# Patient Record
Sex: Male | Born: 1935 | Race: White | Hispanic: No | Smoking: Former smoker
Health system: Southern US, Community
[De-identification: ages and names within clinical notes are randomized; demographics above are authoritative.]

## PROBLEM LIST (undated history)

## (undated) DIAGNOSIS — M1990 Unspecified osteoarthritis, unspecified site: Secondary | ICD-10-CM

## (undated) DIAGNOSIS — K5730 Diverticulosis of large intestine without perforation or abscess without bleeding: Secondary | ICD-10-CM

## (undated) DIAGNOSIS — K219 Gastro-esophageal reflux disease without esophagitis: Secondary | ICD-10-CM

## (undated) DIAGNOSIS — I10 Essential (primary) hypertension: Secondary | ICD-10-CM

## (undated) HISTORY — DX: Unspecified osteoarthritis, unspecified site: M19.90

## (undated) HISTORY — DX: Gastro-esophageal reflux disease without esophagitis: K21.9

## (undated) HISTORY — PX: OTHER SURGICAL HISTORY: SHX169

## (undated) HISTORY — DX: Diverticulosis of large intestine without perforation or abscess without bleeding: K57.30

## (undated) HISTORY — PX: HERNIA REPAIR: SHX51

## (undated) HISTORY — PX: CATARACT EXTRACTION: SUR2

## (undated) HISTORY — DX: Essential (primary) hypertension: I10

---

## 1998-11-07 ENCOUNTER — Ambulatory Visit (HOSPITAL_COMMUNITY): Admission: RE | Admit: 1998-11-07 | Discharge: 1998-11-07 | Payer: Self-pay | Admitting: Gastroenterology

## 1998-11-07 ENCOUNTER — Encounter: Payer: Self-pay | Admitting: Gastroenterology

## 1998-12-03 ENCOUNTER — Ambulatory Visit (HOSPITAL_COMMUNITY): Admission: RE | Admit: 1998-12-03 | Discharge: 1998-12-03 | Payer: Self-pay | Admitting: Gastroenterology

## 1998-12-03 ENCOUNTER — Encounter: Payer: Self-pay | Admitting: Gastroenterology

## 2004-02-08 ENCOUNTER — Emergency Department (HOSPITAL_COMMUNITY): Admission: EM | Admit: 2004-02-08 | Discharge: 2004-02-08 | Payer: Self-pay | Admitting: *Deleted

## 2004-02-18 ENCOUNTER — Ambulatory Visit (HOSPITAL_COMMUNITY): Admission: RE | Admit: 2004-02-18 | Discharge: 2004-02-18 | Payer: Self-pay | Admitting: Gastroenterology

## 2004-03-17 ENCOUNTER — Ambulatory Visit: Payer: Self-pay | Admitting: Family Medicine

## 2004-08-26 ENCOUNTER — Ambulatory Visit: Payer: Self-pay | Admitting: Family Medicine

## 2004-09-23 HISTORY — PX: COLONOSCOPY: SHX174

## 2005-09-01 ENCOUNTER — Ambulatory Visit: Payer: Self-pay | Admitting: Family Medicine

## 2006-09-07 ENCOUNTER — Ambulatory Visit: Payer: Self-pay | Admitting: Family Medicine

## 2006-09-07 LAB — CONVERTED CEMR LAB
ALT: 40 units/L (ref 0–40)
AST: 37 units/L (ref 0–37)
Albumin: 3.9 g/dL (ref 3.5–5.2)
Alkaline Phosphatase: 63 units/L (ref 39–117)
BUN: 16 mg/dL (ref 6–23)
Basophils Absolute: 0 10*3/uL (ref 0.0–0.1)
Basophils Relative: 0.6 % (ref 0.0–1.0)
Bilirubin, Direct: 0.1 mg/dL (ref 0.0–0.3)
CO2: 31 meq/L (ref 19–32)
Calcium: 9.6 mg/dL (ref 8.4–10.5)
Chloride: 108 meq/L (ref 96–112)
Cholesterol: 141 mg/dL (ref 0–200)
Creatinine, Ser: 1 mg/dL (ref 0.4–1.5)
Eosinophils Absolute: 0.3 10*3/uL (ref 0.0–0.6)
Eosinophils Relative: 4.2 % (ref 0.0–5.0)
GFR calc Af Amer: 93 mL/min
GFR calc non Af Amer: 77 mL/min
Glucose, Bld: 101 mg/dL — ABNORMAL HIGH (ref 70–99)
HCT: 45.7 % (ref 39.0–52.0)
HDL: 25.9 mg/dL — ABNORMAL LOW (ref >39.0)
Hemoglobin: 15.8 g/dL (ref 13.0–17.0)
LDL Cholesterol: 80 mg/dL (ref 0–99)
Lymphocytes Relative: 33.4 % (ref 12.0–46.0)
MCHC: 34.6 g/dL (ref 30.0–36.0)
MCV: 92 fL (ref 78.0–100.0)
Monocytes Absolute: 0.8 10*3/uL — ABNORMAL HIGH (ref 0.2–0.7)
Monocytes Relative: 10.3 % (ref 3.0–11.0)
Neutro Abs: 4.4 10*3/uL (ref 1.4–7.7)
Neutrophils Relative %: 51.5 % (ref 43.0–77.0)
PSA: 1.71 ng/mL (ref 0.10–4.00)
Platelets: 270 10*3/uL (ref 150–400)
Potassium: 4.2 meq/L (ref 3.5–5.1)
RBC: 4.96 M/uL (ref 4.22–5.81)
RDW: 12.3 % (ref 11.5–14.6)
Sodium: 143 meq/L (ref 135–145)
TSH: 2.68 microintl units/mL (ref 0.35–5.50)
Total Bilirubin: 1.9 mg/dL — ABNORMAL HIGH (ref 0.3–1.2)
Total CHOL/HDL Ratio: 5.4
Total Protein: 7 g/dL (ref 6.0–8.3)
Triglycerides: 176 mg/dL — ABNORMAL HIGH (ref 0–149)
VLDL: 35 mg/dL (ref 0–40)
WBC: 8.2 10*3/uL (ref 4.5–10.5)

## 2006-09-14 ENCOUNTER — Encounter: Payer: Self-pay | Admitting: Family Medicine

## 2006-09-14 DIAGNOSIS — Z87898 Personal history of other specified conditions: Secondary | ICD-10-CM

## 2006-09-14 DIAGNOSIS — K219 Gastro-esophageal reflux disease without esophagitis: Secondary | ICD-10-CM | POA: Insufficient documentation

## 2006-09-14 DIAGNOSIS — I10 Essential (primary) hypertension: Secondary | ICD-10-CM | POA: Insufficient documentation

## 2007-08-23 ENCOUNTER — Ambulatory Visit: Payer: Self-pay | Admitting: Family Medicine

## 2007-08-23 DIAGNOSIS — R001 Bradycardia, unspecified: Secondary | ICD-10-CM | POA: Insufficient documentation

## 2007-08-23 DIAGNOSIS — K5730 Diverticulosis of large intestine without perforation or abscess without bleeding: Secondary | ICD-10-CM | POA: Insufficient documentation

## 2007-09-06 ENCOUNTER — Ambulatory Visit: Payer: Self-pay | Admitting: Family Medicine

## 2007-09-25 HISTORY — PX: OTHER SURGICAL HISTORY: SHX169

## 2007-10-16 ENCOUNTER — Encounter: Payer: Self-pay | Admitting: Family Medicine

## 2008-03-13 ENCOUNTER — Ambulatory Visit: Payer: Self-pay | Admitting: Family Medicine

## 2008-08-28 ENCOUNTER — Ambulatory Visit: Payer: Self-pay | Admitting: Family Medicine

## 2009-06-11 ENCOUNTER — Ambulatory Visit: Payer: Self-pay | Admitting: Family Medicine

## 2009-06-11 DIAGNOSIS — K645 Perianal venous thrombosis: Secondary | ICD-10-CM | POA: Insufficient documentation

## 2009-06-11 HISTORY — DX: Perianal venous thrombosis: K64.5

## 2009-07-09 ENCOUNTER — Ambulatory Visit: Payer: Self-pay | Admitting: Family Medicine

## 2009-07-09 DIAGNOSIS — J309 Allergic rhinitis, unspecified: Secondary | ICD-10-CM

## 2009-09-03 ENCOUNTER — Ambulatory Visit: Payer: Self-pay | Admitting: Family Medicine

## 2010-05-24 LAB — CONVERTED CEMR LAB
ALT: 35 units/L (ref 0–53)
ALT: 38 units/L (ref 0–53)
ALT: 40 units/L (ref 0–53)
AST: 39 units/L — ABNORMAL HIGH (ref 0–37)
AST: 40 units/L — ABNORMAL HIGH (ref 0–37)
AST: 41 units/L — ABNORMAL HIGH (ref 0–37)
Albumin: 3.5 g/dL (ref 3.5–5.2)
Albumin: 3.7 g/dL (ref 3.5–5.2)
Albumin: 4 g/dL (ref 3.5–5.2)
Alkaline Phosphatase: 56 units/L (ref 39–117)
Alkaline Phosphatase: 69 units/L (ref 39–117)
Alkaline Phosphatase: 74 units/L (ref 39–117)
BUN: 14 mg/dL (ref 6–23)
BUN: 16 mg/dL (ref 6–23)
BUN: 20 mg/dL (ref 6–23)
Basophils Absolute: 0.1 10*3/uL (ref 0.0–0.1)
Basophils Absolute: 0.1 10*3/uL (ref 0.0–0.1)
Basophils Absolute: 0.1 10*3/uL (ref 0.0–0.1)
Basophils Relative: 1 % (ref 0.0–3.0)
Basophils Relative: 1.1 % (ref 0.0–3.0)
Basophils Relative: 1.1 % — ABNORMAL HIGH (ref 0.0–1.0)
Bilirubin, Direct: 0.2 mg/dL (ref 0.0–0.3)
Bilirubin, Direct: 0.2 mg/dL (ref 0.0–0.3)
Bilirubin, Direct: 0.2 mg/dL (ref 0.0–0.3)
CO2: 28 meq/L (ref 19–32)
CO2: 29 meq/L (ref 19–32)
CO2: 32 meq/L (ref 19–32)
Calcium: 9.2 mg/dL (ref 8.4–10.5)
Calcium: 9.2 mg/dL (ref 8.4–10.5)
Calcium: 9.8 mg/dL (ref 8.4–10.5)
Chloride: 102 meq/L (ref 96–112)
Chloride: 102 meq/L (ref 96–112)
Chloride: 108 meq/L (ref 96–112)
Cholesterol: 138 mg/dL (ref 0–200)
Cholesterol: 143 mg/dL (ref 0–200)
Cholesterol: 148 mg/dL (ref 0–200)
Creatinine, Ser: 0.9 mg/dL (ref 0.4–1.5)
Creatinine, Ser: 1 mg/dL (ref 0.4–1.5)
Creatinine, Ser: 1 mg/dL (ref 0.4–1.5)
Eosinophils Absolute: 0.3 10*3/uL (ref 0.0–0.7)
Eosinophils Absolute: 0.3 10*3/uL (ref 0.0–0.7)
Eosinophils Absolute: 0.4 10*3/uL (ref 0.0–0.7)
Eosinophils Relative: 4.1 % (ref 0.0–5.0)
Eosinophils Relative: 4.2 % (ref 0.0–5.0)
Eosinophils Relative: 5.3 % — ABNORMAL HIGH (ref 0.0–5.0)
GFR calc Af Amer: 92 mL/min
GFR calc non Af Amer: 76 mL/min
GFR calc non Af Amer: 76.05 mL/min (ref >60)
GFR calc non Af Amer: 82.49 mL/min (ref >60)
Glucose, Bld: 103 mg/dL — ABNORMAL HIGH (ref 70–99)
Glucose, Bld: 97 mg/dL (ref 70–99)
Glucose, Bld: 97 mg/dL (ref 70–99)
HCT: 46.2 % (ref 39.0–52.0)
HCT: 46.6 % (ref 39.0–52.0)
HCT: 48.5 % (ref 39.0–52.0)
HDL: 20 mg/dL — ABNORMAL LOW (ref >39.0)
HDL: 24.3 mg/dL — ABNORMAL LOW (ref >39.00)
HDL: 31.8 mg/dL — ABNORMAL LOW (ref >39.00)
Hemoglobin: 16.3 g/dL (ref 13.0–17.0)
Hemoglobin: 16.3 g/dL (ref 13.0–17.0)
Hemoglobin: 16.4 g/dL (ref 13.0–17.0)
LDL Cholesterol: 80 mg/dL (ref 0–99)
LDL Cholesterol: 85 mg/dL (ref 0–99)
LDL Cholesterol: 92 mg/dL (ref 0–99)
Lymphocytes Relative: 33.4 % (ref 12.0–46.0)
Lymphocytes Relative: 34.8 % (ref 12.0–46.0)
Lymphocytes Relative: 36.4 % (ref 12.0–46.0)
Lymphs Abs: 2.5 10*3/uL (ref 0.7–4.0)
Lymphs Abs: 2.9 10*3/uL (ref 0.7–4.0)
MCHC: 33.9 g/dL (ref 30.0–36.0)
MCHC: 35 g/dL (ref 30.0–36.0)
MCHC: 35.3 g/dL (ref 30.0–36.0)
MCV: 93.4 fL (ref 78.0–100.0)
MCV: 93.5 fL (ref 78.0–100.0)
MCV: 93.8 fL (ref 78.0–100.0)
Monocytes Absolute: 0.7 10*3/uL (ref 0.1–1.0)
Monocytes Absolute: 0.7 10*3/uL (ref 0.1–1.0)
Monocytes Absolute: 0.9 10*3/uL (ref 0.1–1.0)
Monocytes Relative: 10 % (ref 3.0–12.0)
Monocytes Relative: 10.8 % (ref 3.0–12.0)
Monocytes Relative: 11.1 % (ref 3.0–12.0)
Neutro Abs: 3.4 10*3/uL (ref 1.4–7.7)
Neutro Abs: 3.5 10*3/uL (ref 1.4–7.7)
Neutro Abs: 3.8 10*3/uL (ref 1.4–7.7)
Neutrophils Relative %: 47.3 % (ref 43.0–77.0)
Neutrophils Relative %: 49.4 % (ref 43.0–77.0)
Neutrophils Relative %: 50 % (ref 43.0–77.0)
PSA: 2.08 ng/mL (ref 0.10–4.00)
PSA: 2.08 ng/mL (ref 0.10–4.00)
PSA: 2.31 ng/mL (ref 0.10–4.00)
Platelets: 245 10*3/uL (ref 150.0–400.0)
Platelets: 269 10*3/uL (ref 150–400)
Platelets: 275 10*3/uL (ref 150.0–400.0)
Potassium: 3.7 meq/L (ref 3.5–5.1)
Potassium: 3.7 meq/L (ref 3.5–5.1)
Potassium: 5.2 meq/L — ABNORMAL HIGH (ref 3.5–5.1)
RBC: 4.95 M/uL (ref 4.22–5.81)
RBC: 4.99 M/uL (ref 4.22–5.81)
RBC: 5.17 M/uL (ref 4.22–5.81)
RDW: 12 % (ref 11.5–14.6)
RDW: 12.3 % (ref 11.5–14.6)
RDW: 13.6 % (ref 11.5–14.6)
Sodium: 137 meq/L (ref 135–145)
Sodium: 141 meq/L (ref 135–145)
Sodium: 142 meq/L (ref 135–145)
TSH: 2.09 microintl units/mL (ref 0.35–5.50)
TSH: 2.5 microintl units/mL (ref 0.35–5.50)
TSH: 2.58 microintl units/mL (ref 0.35–5.50)
Total Bilirubin: 2 mg/dL — ABNORMAL HIGH (ref 0.3–1.2)
Total Bilirubin: 2.4 mg/dL — ABNORMAL HIGH (ref 0.3–1.2)
Total Bilirubin: 2.8 mg/dL — ABNORMAL HIGH (ref 0.3–1.2)
Total CHOL/HDL Ratio: 5
Total CHOL/HDL Ratio: 6
Total CHOL/HDL Ratio: 6.9
Total Protein: 6.7 g/dL (ref 6.0–8.3)
Total Protein: 7 g/dL (ref 6.0–8.3)
Total Protein: 7 g/dL (ref 6.0–8.3)
Triglycerides: 129 mg/dL (ref 0–149)
Triglycerides: 156 mg/dL — ABNORMAL HIGH (ref 0.0–149.0)
Triglycerides: 194 mg/dL — ABNORMAL HIGH (ref 0.0–149.0)
VLDL: 26 mg/dL (ref 0–40)
VLDL: 31.2 mg/dL (ref 0.0–40.0)
VLDL: 38.8 mg/dL (ref 0.0–40.0)
WBC: 6.9 10*3/uL (ref 4.5–10.5)
WBC: 7.1 10*3/uL (ref 4.5–10.5)
WBC: 8.1 10*3/uL (ref 4.5–10.5)

## 2010-05-28 NOTE — Assessment & Plan Note (Signed)
Summary: hoarse/stuffy nose/congestion/cjr   Vital Signs:  Patient profile:   77 year old male Weight:      169 pounds BMI:     26.56 Temp:     98.0 degrees F oral Pulse rate:   80 / minute Pulse rhythm:   regular BP sitting:   142 / 58  (left arm) Cuff size:   regular  Vitals Entered By: Raechel Ache, RN (July 09, 2009 10:34 AM) CC: C/o head congestion & hoarseness since Monday.   History of Present Illness: Here for one week of some PND and hoarseness. No HA or sinus pressure or ST or cough or fever. Tried Coricidin.   Allergies (verified): No Known Drug Allergies  Past History:  Past Medical History: Reviewed history from 08/23/2007 and no changes required. Hypertension GERD Diverticulosis, colon  Review of Systems  The patient denies anorexia, fever, weight loss, weight gain, vision loss, decreased hearing, chest pain, syncope, dyspnea on exertion, peripheral edema, prolonged cough, headaches, hemoptysis, abdominal pain, melena, hematochezia, severe indigestion/heartburn, hematuria, incontinence, genital sores, muscle weakness, suspicious skin lesions, transient blindness, difficulty walking, depression, unusual weight change, abnormal bleeding, enlarged lymph nodes, angioedema, breast masses, and testicular masses.    Physical Exam  General:  Well-developed,well-nourished,in no acute distress; alert,appropriate and cooperative throughout examination Head:  Normocephalic and atraumatic without obvious abnormalities. No apparent alopecia or balding. Eyes:  No corneal or conjunctival inflammation noted. EOMI. Perrla. Funduscopic exam benign, without hemorrhages, exudates or papilledema. Vision grossly normal. Ears:  External ear exam shows no significant lesions or deformities.  Otoscopic examination reveals clear canals, tympanic membranes are intact bilaterally without bulging, retraction, inflammation or discharge. Hearing is grossly normal bilaterally. Nose:   External nasal examination shows no deformity or inflammation. Nasal mucosa are pink and moist without lesions or exudates. Mouth:  Oral mucosa and oropharynx without lesions or exudates.  Teeth in good repair. Neck:  No deformities, masses, or tenderness noted.   Impression & Recommendations:  Problem # 1:  ALLERGIC RHINITIS (ICD-477.9)  His updated medication list for this problem includes:    Claritin 10 Mg Tabs (Loratadine) ..... Once daily as needed  Complete Medication List: 1)  Protonix 40 Mg Tbec (Pantoprazole sodium) .... Once daily 2)  Hydrochlorothiazide 25 Mg Tabs (Hydrochlorothiazide) .... Take 1 tab by mouth every morning 3)  Aspirin 81 Mg Tbec (Aspirin) .... One by mouth every day 4)  Multivitamins Tabs (Multiple vitamin) .Marland Kitchen.. 1 by mouth once daily 5)  Mucinex 600 Mg Xr12h-tab (Guaifenesin) .... Two times a day as needed 6)  Claritin 10 Mg Tabs (Loratadine) .... Once daily as needed  Patient Instructions: 1)  Please schedule a follow-up appointment as needed .

## 2010-05-28 NOTE — Assessment & Plan Note (Signed)
Summary: emp-will fast//ccm   Vital Signs:  Patient profile:   77 year old male Height:      67 inches Weight:      164 pounds BMI:     25.78 BP sitting:   120 / 60  (left arm) Cuff size:   regular  Vitals Entered By: Raechel Ache, RN (Sep 03, 2009 9:06 AM) CC: OV, fasting.   History of Present Illness: 77 yr old male for a cpx. He feels great and has no concerns at all.   Allergies (verified): No Known Drug Allergies  Past History:  Past Medical History: Reviewed history from 08/23/2007 and no changes required. Hypertension GERD Diverticulosis, colon  Past Surgical History: Reviewed history from 08/28/2008 and no changes required. Colonoscopy 09-23-04 per Dr. Sherin Quarry, repeat in 10 yrs. Rt inguinal hernia x2 EGD & esophageal dilatation, per Dr. Sherin Quarry 02-18-04 Cataract extraction, right 6-09 per Dr. Dione Booze  Family History: Reviewed history from 08/23/2007 and no changes required. Family History of CAD Male 1st degree relative <60  Social History: Reviewed history from 08/23/2007 and no changes required. Married Never Smoked Alcohol use-no Drug use-no Retired  Review of Systems  The patient denies anorexia, fever, weight loss, weight gain, vision loss, decreased hearing, hoarseness, chest pain, syncope, dyspnea on exertion, peripheral edema, prolonged cough, headaches, hemoptysis, abdominal pain, melena, hematochezia, severe indigestion/heartburn, hematuria, incontinence, genital sores, muscle weakness, suspicious skin lesions, transient blindness, difficulty walking, depression, unusual weight change, abnormal bleeding, enlarged lymph nodes, angioedema, breast masses, and testicular masses.    Physical Exam  General:  Well-developed,well-nourished,in no acute distress; alert,appropriate and cooperative throughout examination Head:  Normocephalic and atraumatic without obvious abnormalities. No apparent alopecia or balding. Eyes:  No corneal or  conjunctival inflammation noted. EOMI. Perrla. Funduscopic exam benign, without hemorrhages, exudates or papilledema. Vision grossly normal. Ears:  External ear exam shows no significant lesions or deformities.  Otoscopic examination reveals clear canals, tympanic membranes are intact bilaterally without bulging, retraction, inflammation or discharge. Hearing is grossly normal bilaterally. Nose:  External nasal examination shows no deformity or inflammation. Nasal mucosa are pink and moist without lesions or exudates. Mouth:  Oral mucosa and oropharynx without lesions or exudates.  Teeth in good repair. Neck:  No deformities, masses, or tenderness noted. Chest Wall:  No deformities, masses, tenderness or gynecomastia noted. Lungs:  Normal respiratory effort, chest expands symmetrically. Lungs are clear to auscultation, no crackles or wheezes. Heart:  Normal rate and regular rhythm. S1 and S2 normal without gallop, murmur, click, rub or other extra sounds. EKG normal Abdomen:  Bowel sounds positive,abdomen soft and non-tender without masses, organomegaly or hernias noted. Rectal:  No external abnormalities noted. Normal sphincter tone. No rectal masses or tenderness. Heme neg. Genitalia:  Testes bilaterally descended without nodularity, tenderness or masses. No scrotal masses or lesions. No penis lesions or urethral discharge. Prostate:  no nodules, no asymmetry, no induration, and 2+ enlarged.   Msk:  No deformity or scoliosis noted of thoracic or lumbar spine.   Pulses:  R and L carotid,radial,femoral,dorsalis pedis and posterior tibial pulses are full and equal bilaterally Extremities:  No clubbing, cyanosis, edema, or deformity noted with normal full range of motion of all joints.   Neurologic:  No cranial nerve deficits noted. Station and gait are normal. Plantar reflexes are down-going bilaterally. DTRs are symmetrical throughout. Sensory, motor and coordinative functions appear intact. Skin:   Intact without suspicious lesions or rashes Cervical Nodes:  No lymphadenopathy noted Axillary Nodes:  No  palpable lymphadenopathy Inguinal Nodes:  No significant adenopathy Psych:  Cognition and judgment appear intact. Alert and cooperative with normal attention span and concentration. No apparent delusions, illusions, hallucinations   Impression & Recommendations:  Problem # 1:  BENIGN PROSTATIC HYPERTROPHY, HX OF (ICD-V13.8)  Orders: TLB-PSA (Prostate Specific Antigen) (84153-PSA)  Problem # 2:  DIVERTICULOSIS, COLON (ICD-562.10)  Orders: Hemoccult Guaiac-1 spec.(in office) (82270)  Problem # 3:  GERD (ICD-530.81)  His updated medication list for this problem includes:    Protonix 40 Mg Tbec (Pantoprazole sodium) ..... Once daily  Problem # 4:  HYPERTENSION (ICD-401.9)  His updated medication list for this problem includes:    Hydrochlorothiazide 25 Mg Tabs (Hydrochlorothiazide) .Marland Kitchen... Take 1 tab by mouth every morning  Orders: Prescription Created Electronically [phone]) UA Dipstick w/o Micro (automated)  (81003) EKG w/ Interpretation (93000) Venipuncture (60454) TLB-Lipid Panel (80061-LIPID) TLB-BMP (Basic Metabolic Panel-BMET) (80048-METABOL) TLB-CBC Platelet - w/Differential (85025-CBCD) TLB-Hepatic/Liver Function Pnl (80076-HEPATIC) TLB-TSH (Thyroid Stimulating Hormone) (84443-TSH)  Complete Medication List: 1)  Protonix 40 Mg Tbec (Pantoprazole sodium) .... Once daily 2)  Hydrochlorothiazide 25 Mg Tabs (Hydrochlorothiazide) .... Take 1 tab by mouth every morning 3)  Aspirin 81 Mg Tbec (Aspirin) .... One by mouth every day 4)  Multivitamins Tabs (Multiple vitamin) .Marland Kitchen.. 1 by mouth once daily  Patient Instructions: 1)  Get labs today Prescriptions: HYDROCHLOROTHIAZIDE 25 MG  TABS (HYDROCHLOROTHIAZIDE) Take 1 tab by mouth every morning  #90 x 3   Entered and Authorized by:   Nelwyn Salisbury MD   Signed by:   Nelwyn Salisbury MD on 09/03/2009   Method used:    Electronically to        [facility]  [address] [phone]* (retail)       [address]       [place], [place]  [id number]       Ph: [phone] or [phone]       Fax: [phone]   RxID:   [id number] PROTONIX 40 MG  TBEC (PANTOPRAZOLE SODIUM) once daily  #90 x 3   Entered and Authorized by:   Nelwyn Salisbury MD   Signed by:   Nelwyn Salisbury MD on 09/03/2009   Method used:   Electronically to        [facility]  [address] [phone]* (retail)       [address]       [place], [place]  [id number]       Ph: [phone] or [phone]       Fax: [phone]   RxID:   [id number]   Appended Document: Orders Update     Clinical Lists Changes  Observations: Added new observation of COMMENTS: Wynona Canes, CMA  Sep 03, 2009 11:13 AM  (09/03/2009 11:12) Added new observation of PH URINE: 7.0  (09/03/2009 11:12) Added new observation of SPEC GR URIN: 1.015  (09/03/2009 11:12) Added new observation of APPEARANCE U: Clear  (09/03/2009 11:12) Added new observation of UA COLOR: yellow  (09/03/2009 11:12) Added new observation of WBC DIPSTK U: negative  (09/03/2009 11:12) Added new observation of NITRITE URN: negative  (09/03/2009 11:12) Added new observation of UROBILINOGEN: 1.0  (09/03/2009 11:12) Added new observation of PROTEIN, URN: 1+  (09/03/2009 11:12) Added new observation of BLOOD UR DIP: negative  (09/03/2009 11:12) Added new observation of KETONES URN: negative  (09/03/2009 11:12) Added new observation of BILIRUBIN UR:  negative  (09/03/2009 11:12) Added new observation of GLUCOSE, URN: negative  (09/03/2009 11:12)      Laboratory Results   Urine Tests  Date/Time Recieved: Sep 03, 2009 11:12 AM  Date/Time Reported: Sep 03, 2009 11:12 AM   Routine Urinalysis   Color: yellow Appearance: Clear Glucose: negative   (Normal Range: Negative) Bilirubin: negative   (Normal Range: Negative) Ketone: negative    (Normal Range: Negative) Spec. Gravity: 1.015   (Normal Range: 1.003-1.035) Blood: negative   (Normal Range: Negative) pH: 7.0   (Normal Range: 5.0-8.0) Protein: 1+   (Normal Range: Negative) Urobilinogen: 1.0   (Normal Range: 0-1) Nitrite: negative   (Normal Range: Negative) Leukocyte Esterace: negative   (Normal Range: Negative)    Comments: Wynona Canes, CMA  Sep 03, 2009 11:13 AM

## 2010-05-28 NOTE — Assessment & Plan Note (Signed)
Summary: PLACE ON BOTTOM//CCM   Vital Signs:  Patient profile:   77 year old male Weight:      166 pounds BMI:     26.09 Temp:     98.1 degrees F oral Pulse rate:   67 / minute BP sitting:   144 / 60  (left arm) Cuff size:   regular  Vitals Entered By: Alfred Levins, CMA (June 11, 2009 8:44 AM) CC: hemorrhoids   History of Present Illness: One week ago he developed a small lump beside the anus which is still present. he has never had this before. There is no swelling or pain or bleeding. His BMs are normal.   Current Medications (verified): 1)  Protonix 40 Mg  Tbec (Pantoprazole Sodium) .... Once Daily 2)  Hydrochlorothiazide 25 Mg  Tabs (Hydrochlorothiazide) .... Take 1 Tab By Mouth Every Morning 3)  Aspirin 81 Mg  Tbec (Aspirin) .... One By Mouth Every Day 4)  Multivitamins   Tabs (Multiple Vitamin) .Marland Kitchen.. 1 By Mouth Once Daily  Allergies (verified): No Known Drug Allergies  Past History:  Past Medical History: Reviewed history from 08/23/2007 and no changes required. Hypertension GERD Diverticulosis, colon  Past Surgical History: Reviewed history from 08/28/2008 and no changes required. Colonoscopy 09-23-04 per Dr. Sherin Quarry, repeat in 10 yrs. Rt inguinal hernia x2 EGD & esophageal dilatation, per Dr. Sherin Quarry 02-18-04 Cataract extraction, right 6-09 per Dr. Dione Booze  Review of Systems  The patient denies anorexia, fever, weight loss, weight gain, vision loss, decreased hearing, hoarseness, chest pain, syncope, dyspnea on exertion, peripheral edema, prolonged cough, headaches, hemoptysis, abdominal pain, melena, hematochezia, severe indigestion/heartburn, hematuria, incontinence, genital sores, muscle weakness, suspicious skin lesions, transient blindness, difficulty walking, depression, unusual weight change, abnormal bleeding, enlarged lymph nodes, angioedema, breast masses, and testicular masses.    Physical Exam  General:  Well-developed,well-nourished,in no acute  distress; alert,appropriate and cooperative throughout examination Rectal:  single small thrombosed nontender hemorrhoid at the anal verge   Impression & Recommendations:  Problem # 1:  EXTERNAL THROMBOSED HEMORRHOIDS (ICD-455.4)  Complete Medication List: 1)  Protonix 40 Mg Tbec (Pantoprazole sodium) .... Once daily 2)  Hydrochlorothiazide 25 Mg Tabs (Hydrochlorothiazide) .... Take 1 tab by mouth every morning 3)  Aspirin 81 Mg Tbec (Aspirin) .... One by mouth every day 4)  Multivitamins Tabs (Multiple vitamin) .Marland Kitchen.. 1 by mouth once daily  Patient Instructions: 1)  At this point since it is not symptomatic, he decided not to have this lanced. he will apply Preparation H as needed . He will return if this becomes uncomfortable.

## 2010-09-08 NOTE — Assessment & Plan Note (Signed)
[facility] OFFICE NOTE   COULSON, WEHNER                      MRN:          [id number]  DATE:09/07/2006                            DOB:          [birth year]    This is a 77 year old gentleman here for a complete physical  examination. He is doing well. He has no complaints at all and feels  great. He continues to exercise by walking  5 or 6 days a week. For  details of his past medical history, family history, social history,  habits, etc, I refer you to our last physical note dated Sep 01, 2005.   ALLERGIES:  None.   CURRENT MEDICATIONS:  1. Aspirin 81 mg per day.  2. Atenolol 50 mg 1 half a tablet per day.  3. Hydrochlorothiazide 50 mg 1 half a tablet per day.  4. Protonix 40 mg per day.  5. Multivitamin daily.   OBJECTIVE:  Height 5 feet 7 inches, weight 170, blood pressure 140/80,  pulse 64 and regular.  IN GENERAL: He appears to be doing well.  SKIN: Clear.  EYES: Clear.  EARS: Clear.  PHARYNX: Clear.  NECK: Supple without lymphadenopathy or masses.  LUNGS: Clear.  CARDIAC: Rate and rhythm regular without gallops, murmurs, or rubs.  Distal pulses are full.   EKG: Within normal limits.  ABDOMEN: Soft, normal bowel sounds, nontender. No masses.  GENITALIA: Normal male. He is circumcised.  RECTAL EXAM: No masses or tenderness. Prostate is mildly enlarged but  smooth. Stool is Hemoccult negative.  EXTREMITIES: No cyanosis, clubbing, or edema.  NEUROLOGIC EXAM: Grossly intact.   ASSESSMENT/PLAN:  1. Complete physical, he is fasting so we will check the usual      laboratories today.  2. Hypertension, stable.  3. Gastroesophageal reflux disease, stable.     Tera Mater. Clent Ridges, MD     SAF/MedQ  DD: 09/07/2006  DT: 09/07/2006  Job #: [id number]

## 2010-09-14 ENCOUNTER — Encounter: Payer: Self-pay | Admitting: Family Medicine

## 2010-09-14 ENCOUNTER — Ambulatory Visit (INDEPENDENT_AMBULATORY_CARE_PROVIDER_SITE_OTHER): Payer: Medicare Other | Admitting: Family Medicine

## 2010-09-14 VITALS — BP 136/72 | HR 68 | Temp 98.1°F | Wt 167.0 lb

## 2010-09-14 DIAGNOSIS — I10 Essential (primary) hypertension: Secondary | ICD-10-CM

## 2010-09-14 DIAGNOSIS — K219 Gastro-esophageal reflux disease without esophagitis: Secondary | ICD-10-CM

## 2010-09-14 DIAGNOSIS — N138 Other obstructive and reflux uropathy: Secondary | ICD-10-CM

## 2010-09-14 DIAGNOSIS — Z0000 Encounter for general adult medical examination without abnormal findings: Secondary | ICD-10-CM

## 2010-09-14 DIAGNOSIS — Z125 Encounter for screening for malignant neoplasm of prostate: Secondary | ICD-10-CM

## 2010-09-14 DIAGNOSIS — E785 Hyperlipidemia, unspecified: Secondary | ICD-10-CM

## 2010-09-14 DIAGNOSIS — N401 Enlarged prostate with lower urinary tract symptoms: Secondary | ICD-10-CM

## 2010-09-14 LAB — BASIC METABOLIC PANEL
Calcium: 9.9 mg/dL (ref 8.4–10.5)
Chloride: 100 mEq/L (ref 96–112)
Creatinine, Ser: 1 mg/dL (ref 0.4–1.5)
GFR: 79.32 mL/min (ref >60.00)

## 2010-09-14 LAB — POCT URINALYSIS DIPSTICK
Glucose, UA: NEGATIVE
Ketones, UA: NEGATIVE
Leukocytes, UA: NEGATIVE
Urobilinogen, UA: 1

## 2010-09-14 LAB — CBC WITH DIFFERENTIAL/PLATELET
Basophils Absolute: 0.1 10*3/uL (ref 0.0–0.1)
Hemoglobin: 16.3 g/dL (ref 13.0–17.0)
Lymphocytes Relative: 24.5 % (ref 12.0–46.0)
Monocytes Relative: 9.9 % (ref 3.0–12.0)
Platelets: 265 10*3/uL (ref 150.0–400.0)
RDW: 13.3 % (ref 11.5–14.6)
WBC: 10.9 10*3/uL — ABNORMAL HIGH (ref 4.5–10.5)

## 2010-09-14 LAB — HEPATIC FUNCTION PANEL
ALT: 40 U/L (ref 0–53)
Total Bilirubin: 1.8 mg/dL — ABNORMAL HIGH (ref 0.3–1.2)

## 2010-09-14 LAB — LIPID PANEL
LDL Cholesterol: 72 mg/dL (ref 0–99)
Total CHOL/HDL Ratio: 5
Triglycerides: 199 mg/dL — ABNORMAL HIGH (ref 0.0–149.0)
VLDL: 39.8 mg/dL (ref 0.0–40.0)

## 2010-09-14 LAB — TSH: TSH: 2.33 u[IU]/mL (ref 0.35–5.50)

## 2010-09-14 MED ORDER — PANTOPRAZOLE SODIUM 40 MG PO TBEC: 40.0000 mg | DELAYED_RELEASE_TABLET | Freq: Every day | ORAL | Status: DC

## 2010-09-14 MED ORDER — HYDROCHLOROTHIAZIDE 25 MG PO TABS: 25.0000 mg | ORAL_TABLET | ORAL | Status: DC

## 2010-09-14 NOTE — Progress Notes (Signed)
  Subjective:    Patient ID: Gabriel [name], male    DOB: [birth year], 77 y.o.   MRN: [id number]  HPI 77 yr old male for a cpx. He feels well and has no concerns. His heartburn is stable, and his BP is stable. No SOB or chest pains. He walks 5 days a week. No bowel or bladder concerns.    Review of Systems  Constitutional: Negative.   HENT: Negative.   Eyes: Negative.   Respiratory: Negative.   Cardiovascular: Negative.   Gastrointestinal: Negative.   Genitourinary: Negative.   Musculoskeletal: Negative.   Skin: Negative.   Neurological: Negative.   Hematological: Negative.   Psychiatric/Behavioral: Negative.        Objective:   Physical Exam  Constitutional: He is oriented to person, place, and time. He appears well-developed and well-nourished. No distress.  HENT:  Head: Normocephalic and atraumatic.  Right Ear: External ear normal.  Left Ear: External ear normal.  Nose: Nose normal.  Mouth/Throat: Oropharynx is clear and moist. No oropharyngeal exudate.  Eyes: Conjunctivae and EOM are normal. Pupils are equal, round, and reactive to light. Right eye exhibits no discharge. Left eye exhibits no discharge. No scleral icterus.  Neck: Neck supple. No JVD present. No tracheal deviation present. No thyromegaly present.  Cardiovascular: Normal rate, regular rhythm, normal heart sounds and intact distal pulses.  Exam reveals no gallop and no friction rub.   No murmur heard.      EKG is normal   Pulmonary/Chest: Effort normal and breath sounds normal. No respiratory distress. He has no wheezes. He has no rales. He exhibits no tenderness.  Abdominal: Soft. Bowel sounds are normal. He exhibits no distension and no mass. There is no tenderness. There is no rebound and no guarding.  Genitourinary: Rectum normal, prostate normal and penis normal. Guaiac negative stool. No penile tenderness.  Musculoskeletal: Normal range of motion. He exhibits no edema and no tenderness.  Lymphadenopathy:     He has no cervical adenopathy.  Neurological: He is alert and oriented to person, place, and time. He has normal reflexes. No cranial nerve deficit. He exhibits normal muscle tone. Coordination normal.  Skin: Skin is warm and dry. No rash noted. He is not diaphoretic. No erythema. No pallor.  Psychiatric: He has a normal mood and affect. His behavior is normal. Judgment and thought content normal.          Assessment & Plan:  We will get fasting labs today. His HTN is stable, as is his GERD. His BPH is controlled. He will continue his current diet and exercise plan.

## 2010-09-15 NOTE — Progress Notes (Signed)
patient  Is aware 

## 2011-02-18 ENCOUNTER — Encounter: Payer: Self-pay | Admitting: Family Medicine

## 2011-02-18 ENCOUNTER — Ambulatory Visit (INDEPENDENT_AMBULATORY_CARE_PROVIDER_SITE_OTHER): Payer: Medicare Other | Admitting: Family Medicine

## 2011-02-18 VITALS — BP 128/70 | HR 75 | Temp 99.2°F | Wt 168.0 lb

## 2011-02-18 DIAGNOSIS — K5732 Diverticulitis of large intestine without perforation or abscess without bleeding: Secondary | ICD-10-CM

## 2011-02-18 MED ORDER — CIPROFLOXACIN HCL 500 MG PO TABS: 500.0000 mg | ORAL_TABLET | Freq: Two times a day (BID) | ORAL | Status: AC

## 2011-02-18 NOTE — Progress Notes (Signed)
  Subjective:    Patient ID: Gabriel [name], male    DOB: 10-17-1924, 77 y.o.   MRN: [id number]  HPI Here for 2 days of LLQ pains which he thinks is due to diverticulitis. He had this once before, and he remembers what this felt like. No nausea or vomiting. No fever. Good appetite. No urinary symptoms.    Review of Systems  Constitutional: Negative.   Respiratory: Negative.   Cardiovascular: Negative.   Gastrointestinal: Positive for abdominal pain. Negative for nausea, vomiting, diarrhea, constipation, blood in stool and abdominal distention.  Genitourinary: Negative.        Objective:   Physical Exam  Constitutional: He appears well-developed and well-nourished.  Abdominal: Soft. Bowel sounds are normal. He exhibits no distension and no mass. There is no rebound and no guarding.       Mildly tender in the LLQ          Assessment & Plan:  Recheck prn

## 2011-03-12 ENCOUNTER — Encounter: Payer: Self-pay | Admitting: Family Medicine

## 2011-03-12 ENCOUNTER — Ambulatory Visit (INDEPENDENT_AMBULATORY_CARE_PROVIDER_SITE_OTHER): Payer: Medicare Other | Admitting: Family Medicine

## 2011-03-12 VITALS — BP 134/64 | HR 76 | Temp 99.0°F | Wt 171.0 lb

## 2011-03-12 DIAGNOSIS — R05 Cough: Secondary | ICD-10-CM

## 2011-03-12 DIAGNOSIS — J3489 Other specified disorders of nose and nasal sinuses: Secondary | ICD-10-CM

## 2011-03-12 DIAGNOSIS — J329 Chronic sinusitis, unspecified: Secondary | ICD-10-CM

## 2011-03-12 MED ORDER — CEPHALEXIN 500 MG PO CAPS: 500.0000 mg | ORAL_CAPSULE | Freq: Three times a day (TID) | ORAL | Status: AC

## 2011-03-12 NOTE — Progress Notes (Signed)
  Subjective:    Patient ID: Gabriel [name], male    DOB: 10-16-1924, 77 y.o.   MRN: [id number]  HPI Here for one week of stuffy head, PND, ST, hoarseness, and a dry cough. No fever.    Review of Systems  Constitutional: Negative.   HENT: Positive for congestion, postnasal drip and sinus pressure.   Eyes: Negative.   Respiratory: Positive for cough.        Objective:   Physical Exam  Constitutional: He appears well-developed and well-nourished.  HENT:  Right Ear: External ear normal.  Left Ear: External ear normal.  Nose: Nose normal.  Mouth/Throat: Oropharynx is clear and moist.  Eyes: Conjunctivae are normal. Pupils are equal, round, and reactive to light.  Neck: No thyromegaly present.  Pulmonary/Chest: Effort normal and breath sounds normal.       Voice is hoarse   Lymphadenopathy:    He has no cervical adenopathy.          Assessment & Plan:  Drink fluids

## 2011-04-12 ENCOUNTER — Telehealth: Payer: Self-pay

## 2011-04-12 NOTE — Telephone Encounter (Signed)
Pt states he usually gets his esophagus stretched and the doctor he usually sees has retired (Dr. Blinda Leatherwood at [facility]). Pt would like a referral to whomever Dr. Clent Ridges suggest.  Pls advise.

## 2011-04-13 NOTE — Telephone Encounter (Signed)
I referred him to Dr. Christella Hartigan. Camelia Eng will call him about the appt

## 2011-04-13 NOTE — Telephone Encounter (Signed)
Spoke with pt

## 2011-04-19 ENCOUNTER — Ambulatory Visit (INDEPENDENT_AMBULATORY_CARE_PROVIDER_SITE_OTHER): Payer: Medicare Other | Admitting: Family Medicine

## 2011-04-19 ENCOUNTER — Encounter: Payer: Self-pay | Admitting: Family Medicine

## 2011-04-19 VITALS — BP 130/70 | HR 93 | Temp 98.7°F | Wt 165.0 lb

## 2011-04-19 DIAGNOSIS — J40 Bronchitis, not specified as acute or chronic: Secondary | ICD-10-CM

## 2011-04-19 MED ORDER — AZITHROMYCIN 250 MG PO TABS: ORAL_TABLET | ORAL | Status: AC

## 2011-04-19 NOTE — Progress Notes (Signed)
  Subjective:    Patient ID: Gabriel [name], male    DOB: 1924-08-02, 77 y.o.   MRN: [id number]  HPI Here for one week of stuffy head, PND, chest congestion, and coughing up yellow sputum. No fever. He was here last month for a sinusitis that responded well to keflex.    Review of Systems  Constitutional: Negative.   HENT: Positive for congestion and postnasal drip.   Eyes: Negative.   Respiratory: Positive for cough.        Objective:   Physical Exam  Constitutional: He appears well-developed and well-nourished.  HENT:  Right Ear: External ear normal.  Left Ear: External ear normal.  Nose: Nose normal.  Mouth/Throat: Oropharynx is clear and moist. No oropharyngeal exudate.  Eyes: Conjunctivae are normal.  Pulmonary/Chest: Effort normal and breath sounds normal. No respiratory distress. He has no wheezes. He has no rales.  Lymphadenopathy:    He has no cervical adenopathy.          Assessment & Plan:  Add Mucinex

## 2011-05-07 ENCOUNTER — Ambulatory Visit (INDEPENDENT_AMBULATORY_CARE_PROVIDER_SITE_OTHER): Payer: Medicare Other | Admitting: Gastroenterology

## 2011-05-07 ENCOUNTER — Encounter: Payer: Self-pay | Admitting: Gastroenterology

## 2011-05-07 DIAGNOSIS — R1310 Dysphagia, unspecified: Secondary | ICD-10-CM

## 2011-05-07 NOTE — Patient Instructions (Signed)
You will be set up for an upper endoscopy at [facility] for dysphagia, likely balloon dilation. A copy of this information will be made available to Dr. Clent Ridges.

## 2011-05-07 NOTE — Progress Notes (Signed)
HPI: This is a   very pleasant 78 year old man whom I am meeting for the first time today.  Had EGD in 2000, 2005 by Dr. Melchor Amour.  Both times dilated.  He has been having the dysphagia trouble again, solid foods, meats, breads.  Chews real well.  Overall stable weight.  No overt GI bleeding.  He used to get pyrosis, but none since starting PPI ,many years ago.    Review of systems: Pertinent positive and negative review of systems were noted in the above HPI section. Complete review of systems was performed and was otherwise normal.    Past Medical History  Diagnosis Date  . Hypertension   . GERD (gastroesophageal reflux disease)   . Diverticulosis of colon     Past Surgical History  Procedure Date  . Colonoscopy 09/23/04    per Dr. Sherin Quarry, repeat in 10 years  . Rt inguinal hernia     x2  . Egd and esophageal diatation, 02-18-04    per Dr. Sherin Quarry  . Cataract extaction, right 6/09    per Dr. Kennedy Bucker    Current Outpatient Prescriptions  Medication Sig Dispense Refill  . aspirin 81 MG tablet Take 81 mg by mouth daily.        . hydrochlorothiazide 25 MG tablet Take 1 tablet (25 mg total) by mouth every morning.  90 tablet  3  . Multiple Vitamin (MULTIVITAMIN PO) Take by mouth daily.        . pantoprazole (PROTONIX) 40 MG tablet Take 1 tablet (40 mg total) by mouth daily.  90 tablet  3  . vitamin C (ASCORBIC ACID) 500 MG tablet Take 500 mg by mouth 2 (two) times daily.          Allergies as of 05/07/2011  . (No Known Allergies)    Family History  Problem Relation Age of Onset  . Coronary artery disease Father     History   Social History  . Marital Status: Married    Spouse Name: N/A    Number of Children: 3  . Years of Education: N/A   Occupational History  . retired    Social History Main Topics  . Smoking status: Former Games developer  . Smokeless tobacco: Never Used  . Alcohol Use: No  . Drug Use: No  . Sexually Active: Not on file   Other Topics Concern  .  Not on file   Social History Narrative  . No narrative on file       Physical Exam: BP 134/78  Pulse 64  Ht 5\' 7"  (1.702 m)  Wt 168 lb 6.4 oz (76.386 kg)  BMI 26.38 kg/m2  SpO2 98% Constitutional: generally well-appearing Psychiatric: alert and oriented x3 Eyes: extraocular movements intact Mouth: oral pharynx moist, no lesions Neck: supple no lymphadenopathy Cardiovascular: heart regular rate and rhythm Lungs: clear to auscultation bilaterally Abdomen: soft, nontender, nondistended, no obvious ascites, no peritoneal signs, normal bowel sounds Extremities: no lower extremity edema bilaterally Skin: no lesions on visible extremities    Assessment and plan: 78 y.o. male with  history of esophageal strictures that have responded to dilation, chronic GERD under good control on proton pump inhibitor, recurrent dysphasia  I suspect he has GERD related dysphasia, likely a peptic stricture. We will proceed with EGD and dilation at his soonest convenience.

## 2011-05-10 ENCOUNTER — Ambulatory Visit (AMBULATORY_SURGERY_CENTER): Payer: Medicare Other | Admitting: Gastroenterology

## 2011-05-10 ENCOUNTER — Encounter: Payer: Self-pay | Admitting: Gastroenterology

## 2011-05-10 VITALS — BP 145/62 | HR 61 | Temp 96.6°F | Resp 12 | Ht 67.0 in | Wt 168.0 lb

## 2011-05-10 DIAGNOSIS — R1310 Dysphagia, unspecified: Secondary | ICD-10-CM | POA: Diagnosis not present

## 2011-05-10 DIAGNOSIS — K449 Diaphragmatic hernia without obstruction or gangrene: Secondary | ICD-10-CM

## 2011-05-10 DIAGNOSIS — K222 Esophageal obstruction: Secondary | ICD-10-CM

## 2011-05-10 HISTORY — PX: OTHER SURGICAL HISTORY: SHX169

## 2011-05-10 MED ORDER — SODIUM CHLORIDE 0.9 % IV SOLN: 500.0000 mL | INTRAVENOUS | Status: DC

## 2011-05-10 NOTE — Op Note (Signed)
[facility] [address] [place], [place]  [address]  ENDOSCOPY PROCEDURE REPORT  PATIENT:  Gabriel [name], Gabriel [name]  MR#:  [id number] BIRTHDATE:  July 20, 1924, 86 yrs. old  GENDER:  male ENDOSCOPIST:  Rachael Fee, MD Referred by:  Tera Mater Clent Ridges, M.D. PROCEDURE DATE:  05/10/2011 PROCEDURE:  EGD with balloon dilatation ASA CLASS:  Class II INDICATIONS:  dysphagia, 2 EGDs with dilation in past 5-8 years MEDICATIONS:   Fentanyl 50 mcg IV, These medications were titrated to patient response per physician's verbal order, Versed 4 mg IV TOPICAL ANESTHETIC:  Cetacaine Spray  DESCRIPTION OF PROCEDURE:   After the risks benefits and alternatives of the procedure were thoroughly explained, informed consent was obtained.  The LB GIF-H180 [id number] endoscope was introduced through the mouth and advanced to the second portion of the duodenum, without limitations.  The instrument was slowly withdrawn as the mucosa was fully examined. <<PROCEDUREIMAGES>> A hiatal hernia was found (see image1 and image4).  There was a benign, focal stricture at GE junction. The lumen was 10-70mm and was dilated up to 18mm with CRE TTS balloon held inflated for 1 minute. There was the usual minor post dilation oozing of blood (see image5, image6, and image7).  Otherwise the examination was normal (see image3).    Retroflexed views revealed no abnormalities.    The scope was then withdrawn from the patient and the procedure completed.  COMPLICATIONS:  None  ENDOSCOPIC IMPRESSION: 1) Hiatal hernia and resultant forshortened, tortuous distal esophagus 2) Benign focal stricture at GE junction, dilated up to 18mm with CRE TTS balloon.  Focal peptic stricture vs. thick mucosal ring. 3) Otherwise normal examination  RECOMMENDATIONS: Can repeat dilation as needed. Continue PPI once daily.  ______________________________ Rachael Fee, MD  n. eSIGNED:   Rachael Fee at 05/10/2011 09:17  AM  Orman, Ivor Messier, [phone]

## 2011-05-10 NOTE — Progress Notes (Signed)
Patient did not experience any of the following events: a burn prior to discharge; a fall within the facility; wrong site/side/patient/procedure/implant event; or a hospital transfer or hospital admission upon discharge from the facility. (G8907) Patient did not have preoperative order for IV antibiotic SSI prophylaxis. (G8918)  

## 2011-05-10 NOTE — Patient Instructions (Signed)
Patient did not experience any of the following events: a burn prior to discharge; a fall within the facility; wrong site/side/patient/procedure/implant event; or a hospital transfer or hospital admission upon discharge from the facility. (G8907)Patient did not experience any of the following events: a burn prior to discharge; a fall within the facility; wrong site/side/patient/procedure/implant event; or a hospital transfer or hospital admission upon discharge from the facility. (G8907) 

## 2011-05-11 ENCOUNTER — Telehealth: Payer: Self-pay | Admitting: *Deleted

## 2011-05-11 NOTE — Telephone Encounter (Signed)

## 2011-06-30 ENCOUNTER — Telehealth: Payer: Self-pay | Admitting: *Deleted

## 2011-06-30 NOTE — Telephone Encounter (Signed)
Discussed with pt

## 2011-06-30 NOTE — Telephone Encounter (Addendum)
Pt needs a referral to Dr. Oretha Milch for hearing loss.  Needs a statement written by Dr. Clent Ridges and faxed to [facility].  [phone]

## 2011-07-01 NOTE — Telephone Encounter (Signed)
Pt needs this referral note sent to [facility] for hearing loss.  [phone]

## 2011-07-01 NOTE — Telephone Encounter (Signed)
Please get more info.

## 2011-07-02 NOTE — Telephone Encounter (Signed)
Addended by: Gershon Crane A on: 07/02/2011 02:35 PM   Modules accepted: Orders

## 2011-07-02 NOTE — Telephone Encounter (Signed)
Left message for pt, Dr. Clent Ridges will send in the referral.

## 2011-07-02 NOTE — Telephone Encounter (Signed)
done

## 2011-07-06 DIAGNOSIS — H908 Mixed conductive and sensorineural hearing loss, unspecified: Secondary | ICD-10-CM | POA: Diagnosis not present

## 2011-07-07 DIAGNOSIS — J0100 Acute maxillary sinusitis, unspecified: Secondary | ICD-10-CM | POA: Diagnosis not present

## 2011-07-19 DIAGNOSIS — J0100 Acute maxillary sinusitis, unspecified: Secondary | ICD-10-CM | POA: Diagnosis not present

## 2011-09-15 ENCOUNTER — Ambulatory Visit (INDEPENDENT_AMBULATORY_CARE_PROVIDER_SITE_OTHER): Payer: Medicare Other | Admitting: Family Medicine

## 2011-09-15 ENCOUNTER — Encounter: Payer: Self-pay | Admitting: Family Medicine

## 2011-09-15 VITALS — BP 130/64 | HR 62 | Temp 99.0°F | Ht 66.5 in | Wt 166.0 lb

## 2011-09-15 DIAGNOSIS — I10 Essential (primary) hypertension: Secondary | ICD-10-CM

## 2011-09-15 DIAGNOSIS — N139 Obstructive and reflux uropathy, unspecified: Secondary | ICD-10-CM | POA: Diagnosis not present

## 2011-09-15 DIAGNOSIS — N401 Enlarged prostate with lower urinary tract symptoms: Secondary | ICD-10-CM | POA: Diagnosis not present

## 2011-09-15 DIAGNOSIS — K219 Gastro-esophageal reflux disease without esophagitis: Secondary | ICD-10-CM | POA: Diagnosis not present

## 2011-09-15 DIAGNOSIS — N138 Other obstructive and reflux uropathy: Secondary | ICD-10-CM

## 2011-09-15 LAB — BASIC METABOLIC PANEL
Calcium: 9.8 mg/dL (ref 8.4–10.5)
GFR: 86.36 mL/min (ref >60.00)
Glucose, Bld: 92 mg/dL (ref 70–99)
Potassium: 5.1 mEq/L (ref 3.5–5.1)
Sodium: 142 mEq/L (ref 135–145)

## 2011-09-15 LAB — CBC WITH DIFFERENTIAL/PLATELET
Basophils Relative: 1.3 % (ref 0.0–3.0)
Eosinophils Relative: 4.3 % (ref 0.0–5.0)
Monocytes Relative: 10.7 % (ref 3.0–12.0)
Neutrophils Relative %: 49.2 % (ref 43.0–77.0)
Platelets: 306 10*3/uL (ref 150.0–400.0)
RBC: 4.95 Mil/uL (ref 4.22–5.81)
WBC: 8.4 10*3/uL (ref 4.5–10.5)

## 2011-09-15 LAB — HEPATIC FUNCTION PANEL
Albumin: 3.9 g/dL (ref 3.5–5.2)
Alkaline Phosphatase: 80 U/L (ref 39–117)
Bilirubin, Direct: 0.2 mg/dL (ref 0.0–0.3)
Total Protein: 7 g/dL (ref 6.0–8.3)

## 2011-09-15 LAB — LIPID PANEL
HDL: 26.7 mg/dL — ABNORMAL LOW (ref >39.00)
Total CHOL/HDL Ratio: 6
Triglycerides: 235 mg/dL — ABNORMAL HIGH (ref 0.0–149.0)
VLDL: 47 mg/dL — ABNORMAL HIGH (ref 0.0–40.0)

## 2011-09-15 LAB — POCT URINALYSIS DIPSTICK
Bilirubin, UA: NEGATIVE
Glucose, UA: NEGATIVE
Nitrite, UA: NEGATIVE
Spec Grav, UA: 1.015
Urobilinogen, UA: 0.2

## 2011-09-15 LAB — PSA: PSA: 2.32 ng/mL (ref 0.10–4.00)

## 2011-09-15 LAB — TSH: TSH: 3.31 u[IU]/mL (ref 0.35–5.50)

## 2011-09-15 LAB — LDL CHOLESTEROL, DIRECT: Direct LDL: 78.4 mg/dL

## 2011-09-15 MED ORDER — HYDROCHLOROTHIAZIDE 25 MG PO TABS: 25.0000 mg | ORAL_TABLET | ORAL | Status: DC

## 2011-09-15 MED ORDER — PANTOPRAZOLE SODIUM 40 MG PO TBEC: 40.0000 mg | DELAYED_RELEASE_TABLET | Freq: Every day | ORAL | Status: DC

## 2011-09-15 NOTE — Progress Notes (Signed)
  Subjective:    Patient ID: Gabriel [name], male    DOB: [birth year], 78 y.o.   MRN: [id number]  HPI 78 yr old male for a cpx. He feels fine and has no concerns. He recently purchased a pair of hearing aids, and he is very pleased with the results.    Review of Systems  Constitutional: Negative.   HENT: Negative.   Eyes: Negative.   Respiratory: Negative.   Cardiovascular: Negative.   Gastrointestinal: Negative.   Genitourinary: Negative.   Musculoskeletal: Negative.   Skin: Negative.   Neurological: Negative.   Hematological: Negative.   Psychiatric/Behavioral: Negative.        Objective:   Physical Exam  Constitutional: He is oriented to person, place, and time. He appears well-developed and well-nourished. No distress.  HENT:  Head: Normocephalic and atraumatic.  Right Ear: External ear normal.  Left Ear: External ear normal.  Nose: Nose normal.  Mouth/Throat: Oropharynx is clear and moist. No oropharyngeal exudate.  Eyes: Conjunctivae and EOM are normal. Pupils are equal, round, and reactive to light. Right eye exhibits no discharge. Left eye exhibits no discharge. No scleral icterus.  Neck: Neck supple. No JVD present. No tracheal deviation present. No thyromegaly present.  Cardiovascular: Normal rate, regular rhythm, normal heart sounds and intact distal pulses.  Exam reveals no gallop and no friction rub.   No murmur heard.      EKG normal   Pulmonary/Chest: Effort normal and breath sounds normal. No respiratory distress. He has no wheezes. He has no rales. He exhibits no tenderness.  Abdominal: Soft. Bowel sounds are normal. He exhibits no distension and no mass. There is no tenderness. There is no rebound and no guarding.  Genitourinary: Rectum normal, prostate normal and penis normal. Guaiac negative stool. No penile tenderness.  Musculoskeletal: Normal range of motion. He exhibits no edema and no tenderness.  Lymphadenopathy:    He has no cervical adenopathy.    Neurological: He is alert and oriented to person, place, and time. He has normal reflexes. No cranial nerve deficit. He exhibits normal muscle tone. Coordination normal.  Skin: Skin is warm and dry. No rash noted. He is not diaphoretic. No erythema. No pallor.  Psychiatric: He has a normal mood and affect. His behavior is normal. Judgment and thought content normal.          Assessment & Plan:  Well exam. Get fasting labs.

## 2011-09-28 DIAGNOSIS — Z85828 Personal history of other malignant neoplasm of skin: Secondary | ICD-10-CM | POA: Diagnosis not present

## 2011-09-28 DIAGNOSIS — L570 Actinic keratosis: Secondary | ICD-10-CM | POA: Diagnosis not present

## 2011-09-28 DIAGNOSIS — D235 Other benign neoplasm of skin of trunk: Secondary | ICD-10-CM | POA: Diagnosis not present

## 2011-09-28 DIAGNOSIS — C44319 Basal cell carcinoma of skin of other parts of face: Secondary | ICD-10-CM | POA: Diagnosis not present

## 2011-11-02 DIAGNOSIS — Z85828 Personal history of other malignant neoplasm of skin: Secondary | ICD-10-CM | POA: Diagnosis not present

## 2011-11-02 DIAGNOSIS — L570 Actinic keratosis: Secondary | ICD-10-CM | POA: Diagnosis not present

## 2011-12-16 DIAGNOSIS — H3530 Unspecified macular degeneration: Secondary | ICD-10-CM | POA: Diagnosis not present

## 2011-12-16 DIAGNOSIS — D3130 Benign neoplasm of unspecified choroid: Secondary | ICD-10-CM | POA: Diagnosis not present

## 2012-01-20 DIAGNOSIS — Z23 Encounter for immunization: Secondary | ICD-10-CM | POA: Diagnosis not present

## 2012-02-21 ENCOUNTER — Ambulatory Visit (INDEPENDENT_AMBULATORY_CARE_PROVIDER_SITE_OTHER): Payer: Medicare Other | Admitting: Family Medicine

## 2012-02-21 ENCOUNTER — Encounter: Payer: Self-pay | Admitting: Family Medicine

## 2012-02-21 VITALS — BP 130/70 | HR 63 | Temp 98.6°F | Wt 165.0 lb

## 2012-02-21 DIAGNOSIS — R112 Nausea with vomiting, unspecified: Secondary | ICD-10-CM

## 2012-02-21 DIAGNOSIS — K8020 Calculus of gallbladder without cholecystitis without obstruction: Secondary | ICD-10-CM

## 2012-02-21 DIAGNOSIS — I10 Essential (primary) hypertension: Secondary | ICD-10-CM | POA: Diagnosis not present

## 2012-02-21 NOTE — Progress Notes (Signed)
  Subjective:    Patient ID: Gabriel [name], male    DOB: [birth year], 78 y.o.   MRN: [id number]  HPI Here for 3 episodes of vomiting in the past 3 weeks. This is usually shortly after a meal, and he remembers one such meal was fried seafood. He has a foul taste in the mouth and he has been doing a lot of belching. No abdominal pains or fever. BMs are regular. He still takes a Protonix daily.    Review of Systems  Respiratory: Negative.   Cardiovascular: Negative.   Gastrointestinal: Positive for nausea and vomiting. Negative for diarrhea, constipation, blood in stool, abdominal distention and anal bleeding.       Objective:   Physical Exam  Constitutional: He appears well-developed and well-nourished.  Cardiovascular: Normal rate, regular rhythm, normal heart sounds and intact distal pulses.   Pulmonary/Chest: Effort normal and breath sounds normal.  Abdominal: Soft. Bowel sounds are normal. He exhibits no distension and no mass. There is no tenderness. There is no rebound and no guarding.          Assessment & Plan:  Post-prandial vomiting, possible gall bladder issues. Advised him to avoid fatty or fried foods. We will set up an [place] soon.

## 2012-02-25 ENCOUNTER — Ambulatory Visit
Admission: RE | Admit: 2012-02-25 | Discharge: 2012-02-25 | Disposition: A | Discharge status: Home / Self Care | Payer: Medicare Other | Source: Ambulatory Visit | Attending: Family Medicine | Admitting: Family Medicine

## 2012-02-25 DIAGNOSIS — R112 Nausea with vomiting, unspecified: Secondary | ICD-10-CM

## 2012-02-25 DIAGNOSIS — K8020 Calculus of gallbladder without cholecystitis without obstruction: Secondary | ICD-10-CM | POA: Diagnosis not present

## 2012-02-25 NOTE — Addendum Note (Signed)
Addended by: Gershon Crane A on: 02/25/2012 12:30 PM   Modules accepted: Orders

## 2012-02-25 NOTE — Progress Notes (Signed)
Quick Note:  I left voice message with results. ______ 

## 2012-02-28 ENCOUNTER — Ambulatory Visit: Payer: Medicare Other

## 2012-02-28 ENCOUNTER — Ambulatory Visit (INDEPENDENT_AMBULATORY_CARE_PROVIDER_SITE_OTHER): Payer: Medicare Other | Admitting: Family Medicine

## 2012-02-28 VITALS — BP 128/82 | HR 81 | Temp 98.2°F | Resp 17 | Ht 65.0 in | Wt 164.0 lb

## 2012-02-28 DIAGNOSIS — M25572 Pain in left ankle and joints of left foot: Secondary | ICD-10-CM

## 2012-02-28 DIAGNOSIS — M79605 Pain in left leg: Secondary | ICD-10-CM

## 2012-02-28 DIAGNOSIS — M79609 Pain in unspecified limb: Secondary | ICD-10-CM

## 2012-02-28 DIAGNOSIS — M25579 Pain in unspecified ankle and joints of unspecified foot: Secondary | ICD-10-CM | POA: Diagnosis not present

## 2012-02-28 DIAGNOSIS — S82839A Other fracture of upper and lower end of unspecified fibula, initial encounter for closed fracture: Secondary | ICD-10-CM

## 2012-02-28 DIAGNOSIS — S82409A Unspecified fracture of shaft of unspecified fibula, initial encounter for closed fracture: Secondary | ICD-10-CM | POA: Diagnosis not present

## 2012-02-28 NOTE — Progress Notes (Signed)
[facility]:  Office Visit  Chief Complaint:  Chief Complaint  Patient presents with  . Leg Pain    left leg     HPI: Gabriel [name] is a 78 y.o. male who complains of  Left ankle and leg pain s/p fall on Saturday 3 days ago after he tried to kick a ball that was in his yard. He did fall on his left side and landed on gravel. Did not noticed swelling but has swelling currently, he has no pain at rest but has pain that is localized to anterior ankle 8/10 that is described as Sharp pain with weight bearing. He is tender at upper left leg near knee on palpation only, does not have pain in his upper leg with weightbearing. He denies any hip pain. Denies numbness, tingling, No prior injuries or surgeries to leg or ankles. He has not tried anything for it. Denies any h/o osteoporosis or osteopenia. Tried OTC NSAIDs with minimal relief.  Past Medical History  Diagnosis Date  . Hypertension   . GERD (gastroesophageal reflux disease)   . Diverticulosis of colon    Past Surgical History  Procedure Date  . Colonoscopy 09/23/04    per Dr. Sherin Quarry, repeat in 10 years  . Rt inguinal hernia     x2  . Egd and esophageal diatation, 05-10-11    per Dr. Christella Hartigan    . Cataract extaction, right 6/09    per Dr. Kennedy Bucker  . Hernia repair    History   Social History  . Marital Status: Married    Spouse Name: N/A    Number of Children: 3  . Years of Education: N/A   Occupational History  . retired    Social History Main Topics  . Smoking status: Former Games developer  . Smokeless tobacco: Never Used  . Alcohol Use: No  . Drug Use: No  . Sexually Active: No   Other Topics Concern  . None   Social History Narrative  . None   Family History  Problem Relation Age of Onset  . Coronary artery disease Father   . Colon cancer Neg Hx   . Stomach cancer Neg Hx   . Esophageal cancer Neg Hx    No Known Allergies Prior to Admission medications   Medication Sig Start Date End Date  Taking? Authorizing Provider  aspirin 81 MG tablet Take 81 mg by mouth daily.     Yes Historical Provider, MD  hydrochlorothiazide (HYDRODIURIL) 25 MG tablet Take 1 tablet (25 mg total) by mouth every morning. 09/15/11  Yes Nelwyn Salisbury, MD  Multiple Vitamin (MULTIVITAMIN PO) Take by mouth daily.     Yes Historical Provider, MD  pantoprazole (PROTONIX) 40 MG tablet Take 1 tablet (40 mg total) by mouth daily. 09/15/11  Yes Nelwyn Salisbury, MD  vitamin C (ASCORBIC ACID) 500 MG tablet Take 500 mg by mouth daily.    Yes Historical Provider, MD     ROS: The patient denies fevers, chills, night sweats, unintentional weight loss, chest pain, palpitations, wheezing, dyspnea on exertion, nausea, vomiting, abdominal pain, dysuria, hematuria, melena, numbness, weakness, or tingling.   All other systems have been reviewed and were otherwise negative with the exception of those mentioned in the HPI and as above.    PHYSICAL EXAM: Filed Vitals:   02/28/12 1001  BP: 128/82  Pulse: 81  Temp: 98.2 F (36.8 C)  Resp: 17   Filed Vitals:   02/28/12 1001  Height:  5\' 5"  (1.651 m)  Weight: 164 lb (74.39 kg)   Body mass index is 27.29 kg/(m^2).  General: Alert, no acute distress HEENT:  Normocephalic, atraumatic, oropharynx patent.  Cardiovascular:  Regular rate and rhythm, no rubs murmurs or gallops.  No Carotid bruits, radial pulse intact. No pedal edema.  Respiratory: Clear to auscultation bilaterally.  No wheezes, rales, or rhonchi.  No cyanosis, no use of accessory musculature GI: No organomegaly, abdomen is soft and non-tender, positive bowel sounds.  No masses. Skin: No rashes. Neurologic: Facial musculature symmetric. Psychiatric: Patient is appropriate throughout our interaction. Lymphatic: No cervical lymphadenopathy Musculoskeletal: Gait intact. Left hip-normal ROM Left leg-tender at proximal fib Left ankle-no deformities, skin intact, + tenderness on palpation, mild swelling, + DP, + post  tib artery pulse 5/5 strength, PROM, AROM intact. Neg anterior drawer.     LABS: Results for orders placed in visit on 09/15/11  LIPID PANEL      Component Value Range   Cholesterol 150  0 - 200 mg/dL   Triglycerides 782.9 (*) 0.0 - 149.0 mg/dL   HDL 56.21 (*) >30.86 mg/dL   VLDL 57.8 (*) 0.0 - 46.9 mg/dL   Total CHOL/HDL Ratio 6    BASIC METABOLIC PANEL      Component Value Range   Sodium 142  135 - 145 mEq/L   Potassium 5.1  3.5 - 5.1 mEq/L   Chloride 105  96 - 112 mEq/L   CO2 29  19 - 32 mEq/L   Glucose, Bld 92  70 - 99 mg/dL   BUN 17  6 - 23 mg/dL   Creatinine, Ser 0.9  0.4 - 1.5 mg/dL   Calcium 9.8  8.4 - 62.9 mg/dL   GFR 52.84  >13.24 mL/min  PSA      Component Value Range   PSA 2.32  0.10 - 4.00 ng/mL  HEPATIC FUNCTION PANEL      Component Value Range   Total Bilirubin 1.6 (*) 0.3 - 1.2 mg/dL   Bilirubin, Direct 0.2  0.0 - 0.3 mg/dL   Alkaline Phosphatase 80  39 - 117 U/L   AST 44 (*) 0 - 37 U/L   ALT 44  0 - 53 U/L   Total Protein 7.0  6.0 - 8.3 g/dL   Albumin 3.9  3.5 - 5.2 g/dL  POCT URINALYSIS DIPSTICK      Component Value Range   Color, UA yellow     Clarity, UA clear     Glucose, UA n     Bilirubin, UA n     Ketones, UA n     Spec Grav, UA 1.015     Blood, UA n     pH, UA 7.0     Protein, UA n     Urobilinogen, UA 0.2     Nitrite, UA n     Leukocytes, UA Negative    TSH      Component Value Range   TSH 3.31  0.35 - 5.50 uIU/mL  CBC WITH DIFFERENTIAL      Component Value Range   WBC 8.4  4.5 - 10.5 K/uL   RBC 4.95  4.22 - 5.81 Mil/uL   Hemoglobin 15.6  13.0 - 17.0 g/dL   HCT 40.1  02.7 - 25.3 %   MCV 93.9  78.0 - 100.0 fl   MCHC 33.5  30.0 - 36.0 g/dL   RDW 66.4  40.3 - 47.4 %   Platelets 306.0  150.0 - 400.0 K/uL  Neutrophils Relative 49.2  43.0 - 77.0 %   Lymphocytes Relative 34.5  12.0 - 46.0 %   Monocytes Relative 10.7  3.0 - 12.0 %   Eosinophils Relative 4.3  0.0 - 5.0 %   Basophils Relative 1.3  0.0 - 3.0 %   Neutro Abs 4.1  1.4  - 7.7 K/uL   Lymphs Abs 2.9  0.7 - 4.0 K/uL   Monocytes Absolute 0.9  0.1 - 1.0 K/uL   Eosinophils Absolute 0.4  0.0 - 0.7 K/uL   Basophils Absolute 0.1  0.0 - 0.1 K/uL  LDL CHOLESTEROL, DIRECT      Component Value Range   Direct LDL 78.4       EKG/XRAY:   Primary read interpreted by Dr. Conley Rolls at [facility]. Left ankle no fractures/subluxation Left tib/fib + proximal fibula fx, minimally to non displaced   ASSESSMENT/PLAN: Encounter Diagnoses  Name Primary?  Marland Kitchen Ankle pain, left Yes  . Left leg pain   . Fracture, fibula, proximal    He has ankle pain with an isolated proximal fibula fracture and a negative ankle xray, worrisome for syndesmotic injury with this picture. Will send to ortho.  Refer to  Dr. Thurston Hole , appt made and is at 3 pm.  Rx Tramadol for pain since does not want narcotics Posterior splint and crutches given     Denilson Salminen PHUONG, DO 02/29/2012 10:24 AM

## 2012-03-01 ENCOUNTER — Ambulatory Visit (INDEPENDENT_AMBULATORY_CARE_PROVIDER_SITE_OTHER): Payer: Medicare Other | Admitting: General Surgery

## 2012-03-10 ENCOUNTER — Ambulatory Visit (INDEPENDENT_AMBULATORY_CARE_PROVIDER_SITE_OTHER): Payer: Medicare Other | Admitting: General Surgery

## 2012-03-10 ENCOUNTER — Encounter (INDEPENDENT_AMBULATORY_CARE_PROVIDER_SITE_OTHER): Payer: Self-pay | Admitting: General Surgery

## 2012-03-10 VITALS — BP 132/74 | HR 56 | Temp 97.7°F | Resp 18 | Ht 66.5 in | Wt 168.0 lb

## 2012-03-10 DIAGNOSIS — K8020 Calculus of gallbladder without cholecystitis without obstruction: Secondary | ICD-10-CM | POA: Diagnosis not present

## 2012-03-10 NOTE — Progress Notes (Signed)
Subjective:     Patient ID: Gabriel [name], male   DOB: 1924-12-01, 78 y.o.   MRN: [id number]  HPI The patient is an 78 year old male who is referred by Dr. Clent Ridges for nausea vomiting and ultrasound with gallstones.  The patient states that approximately 2 months ago patient had some nausea and vomiting after eating some fried foods, but had no abdominal pain. This prompted ultrasound which revealed gallstones within the gallbladder. Patient says since that time had no nausea vomiting no abdominal pain, and has continued on the same diet.  Ultrasound reveals multiple gallstones however no signs or symptoms of inflammation.  Review of Systems  Constitutional: Negative.   HENT: Negative.   Eyes: Negative.   Respiratory: Negative.   Cardiovascular: Negative.   Gastrointestinal: Negative.   Musculoskeletal: Negative.   Neurological: Negative.        Objective:   Physical Exam  Constitutional: He is oriented to person, place, and time. He appears well-developed and well-nourished.  HENT:  Head: Normocephalic and atraumatic.  Eyes: Conjunctivae normal and EOM are normal. Pupils are equal, round, and reactive to light.  Neck: Normal range of motion. Neck supple.  Cardiovascular: Normal rate, regular rhythm and normal heart sounds.   Pulmonary/Chest: Effort normal and breath sounds normal.  Abdominal: Soft. There is no tenderness. There is no rebound and no guarding.  Musculoskeletal: Normal range of motion.  Neurological: He is alert and oriented to person, place, and time.       Assessment:     Patient is an 78 year old male with gallstones.    Plan:     1. Enough of the patient's symptoms at this time related to his gallstones. Patient had an unchanged diet since his initial nausea vomiting which is not returned. He has had no pain in the right upper quadrant. 2. I discussed with the patient and his daughter that should his right upper quadrant pain and nausea return will revisit  the possibility of removing his gallbladder. At this time recommend stain on a low fat diet away from spicy foods.

## 2012-03-14 DIAGNOSIS — S8290XD Unspecified fracture of unspecified lower leg, subsequent encounter for closed fracture with routine healing: Secondary | ICD-10-CM | POA: Diagnosis not present

## 2012-03-21 DIAGNOSIS — C44319 Basal cell carcinoma of skin of other parts of face: Secondary | ICD-10-CM | POA: Diagnosis not present

## 2012-03-21 DIAGNOSIS — L570 Actinic keratosis: Secondary | ICD-10-CM | POA: Diagnosis not present

## 2012-03-21 DIAGNOSIS — D235 Other benign neoplasm of skin of trunk: Secondary | ICD-10-CM | POA: Diagnosis not present

## 2012-05-02 DIAGNOSIS — C44319 Basal cell carcinoma of skin of other parts of face: Secondary | ICD-10-CM | POA: Diagnosis not present

## 2012-05-02 DIAGNOSIS — L570 Actinic keratosis: Secondary | ICD-10-CM | POA: Diagnosis not present

## 2012-05-02 DIAGNOSIS — Z85828 Personal history of other malignant neoplasm of skin: Secondary | ICD-10-CM | POA: Diagnosis not present

## 2012-06-28 DIAGNOSIS — Z85828 Personal history of other malignant neoplasm of skin: Secondary | ICD-10-CM | POA: Diagnosis not present

## 2012-09-15 ENCOUNTER — Encounter: Payer: Self-pay | Admitting: Family Medicine

## 2012-09-15 ENCOUNTER — Ambulatory Visit (INDEPENDENT_AMBULATORY_CARE_PROVIDER_SITE_OTHER): Payer: Medicare Other | Admitting: Family Medicine

## 2012-09-15 VITALS — BP 130/64 | HR 62 | Temp 98.3°F | Ht 65.5 in | Wt 157.0 lb

## 2012-09-15 DIAGNOSIS — K219 Gastro-esophageal reflux disease without esophagitis: Secondary | ICD-10-CM

## 2012-09-15 DIAGNOSIS — J309 Allergic rhinitis, unspecified: Secondary | ICD-10-CM

## 2012-09-15 DIAGNOSIS — I10 Essential (primary) hypertension: Secondary | ICD-10-CM

## 2012-09-15 DIAGNOSIS — N401 Enlarged prostate with lower urinary tract symptoms: Secondary | ICD-10-CM

## 2012-09-15 DIAGNOSIS — N139 Obstructive and reflux uropathy, unspecified: Secondary | ICD-10-CM

## 2012-09-15 DIAGNOSIS — I498 Other specified cardiac arrhythmias: Secondary | ICD-10-CM | POA: Diagnosis not present

## 2012-09-15 DIAGNOSIS — K5730 Diverticulosis of large intestine without perforation or abscess without bleeding: Secondary | ICD-10-CM

## 2012-09-15 DIAGNOSIS — N138 Other obstructive and reflux uropathy: Secondary | ICD-10-CM

## 2012-09-15 LAB — LIPID PANEL
HDL: 31.5 mg/dL — ABNORMAL LOW (ref >39.00)
LDL Cholesterol: 88 mg/dL (ref 0–99)
Total CHOL/HDL Ratio: 5
VLDL: 30.8 mg/dL (ref 0.0–40.0)

## 2012-09-15 LAB — BASIC METABOLIC PANEL
Calcium: 9.5 mg/dL (ref 8.4–10.5)
GFR: 87.28 mL/min (ref >60.00)
Sodium: 140 mEq/L (ref 135–145)

## 2012-09-15 LAB — CBC WITH DIFFERENTIAL/PLATELET
Basophils Relative: 1.5 % (ref 0.0–3.0)
Eosinophils Absolute: 0.3 10*3/uL (ref 0.0–0.7)
Hemoglobin: 15.9 g/dL (ref 13.0–17.0)
Lymphs Abs: 3.3 10*3/uL (ref 0.7–4.0)
MCHC: 34.3 g/dL (ref 30.0–36.0)
MCV: 91.8 fl (ref 78.0–100.0)
Monocytes Absolute: 0.8 10*3/uL (ref 0.1–1.0)
Neutro Abs: 3.8 10*3/uL (ref 1.4–7.7)
RBC: 5.05 Mil/uL (ref 4.22–5.81)

## 2012-09-15 LAB — POCT URINALYSIS DIPSTICK
Bilirubin, UA: NEGATIVE
Blood, UA: NEGATIVE
Ketones, UA: NEGATIVE
pH, UA: 7

## 2012-09-15 LAB — HEPATIC FUNCTION PANEL
ALT: 36 U/L (ref 0–53)
AST: 35 U/L (ref 0–37)
Albumin: 4 g/dL (ref 3.5–5.2)
Alkaline Phosphatase: 86 U/L (ref 39–117)
Total Bilirubin: 1.5 mg/dL — ABNORMAL HIGH (ref 0.3–1.2)

## 2012-09-15 LAB — PSA: PSA: 2.73 ng/mL (ref 0.10–4.00)

## 2012-09-15 MED ORDER — HYDROCHLOROTHIAZIDE 25 MG PO TABS: 25.0000 mg | ORAL_TABLET | ORAL | Status: DC

## 2012-09-15 MED ORDER — PANTOPRAZOLE SODIUM 40 MG PO TBEC: 40.0000 mg | DELAYED_RELEASE_TABLET | Freq: Every day | ORAL | Status: DC

## 2012-09-15 NOTE — Progress Notes (Signed)
  Subjective:    Patient ID: Gabriel [name], male    DOB: June 21, 1924, 79 y.o.   MRN: [id number]  HPI 79 yr old male for a cpx. He feels well and has no concerns. He has put his house up for sale and he plans to move to [facility]. He is looking forward to this so he can socialize more with others.    Review of Systems  Constitutional: Negative.   HENT: Negative.   Eyes: Negative.   Respiratory: Negative.   Cardiovascular: Negative.   Gastrointestinal: Negative.   Genitourinary: Negative.   Musculoskeletal: Negative.   Skin: Negative.   Neurological: Negative.   Psychiatric/Behavioral: Negative.        Objective:   Physical Exam  Constitutional: He is oriented to person, place, and time. He appears well-developed and well-nourished. No distress.  HENT:  Head: Normocephalic and atraumatic.  Right Ear: External ear normal.  Left Ear: External ear normal.  Nose: Nose normal.  Mouth/Throat: Oropharynx is clear and moist. No oropharyngeal exudate.  Eyes: Conjunctivae and EOM are normal. Pupils are equal, round, and reactive to light. Right eye exhibits no discharge. Left eye exhibits no discharge. No scleral icterus.  Neck: Neck supple. No JVD present. No tracheal deviation present. No thyromegaly present.  Cardiovascular: Normal rate, regular rhythm, normal heart sounds and intact distal pulses.  Exam reveals no gallop and no friction rub.   No murmur heard. EKG normal   Pulmonary/Chest: Effort normal and breath sounds normal. No respiratory distress. He has no wheezes. He has no rales. He exhibits no tenderness.  Abdominal: Soft. Bowel sounds are normal. He exhibits no distension and no mass. There is no tenderness. There is no rebound and no guarding.  Genitourinary: Rectum normal, prostate normal and penis normal. Guaiac negative stool. No penile tenderness.  Musculoskeletal: Normal range of motion. He exhibits no edema and no tenderness.  Lymphadenopathy:    He has no  cervical adenopathy.  Neurological: He is alert and oriented to person, place, and time. He has normal reflexes. No cranial nerve deficit. He exhibits normal muscle tone. Coordination normal.  Skin: Skin is warm and dry. No rash noted. He is not diaphoretic. No erythema. No pallor.  Psychiatric: He has a normal mood and affect. His behavior is normal. Judgment and thought content normal.          Assessment & Plan:  Well exam. Get fasting labs

## 2012-09-19 NOTE — Progress Notes (Signed)
Quick Note:  I left voice message with results. ______ 

## 2012-12-21 DIAGNOSIS — H353190 Nonexudative age-related macular degeneration, unspecified eye, stage unspecified: Secondary | ICD-10-CM | POA: Diagnosis not present

## 2012-12-21 DIAGNOSIS — H5200 Hypermetropia, unspecified eye: Secondary | ICD-10-CM | POA: Diagnosis not present

## 2012-12-21 DIAGNOSIS — H2589 Other age-related cataract: Secondary | ICD-10-CM | POA: Diagnosis not present

## 2012-12-21 DIAGNOSIS — H52229 Regular astigmatism, unspecified eye: Secondary | ICD-10-CM | POA: Diagnosis not present

## 2013-01-18 DIAGNOSIS — Z23 Encounter for immunization: Secondary | ICD-10-CM | POA: Diagnosis not present

## 2013-05-07 ENCOUNTER — Ambulatory Visit (INDEPENDENT_AMBULATORY_CARE_PROVIDER_SITE_OTHER): Payer: Medicare Other | Admitting: Family Medicine

## 2013-05-07 ENCOUNTER — Encounter: Payer: Self-pay | Admitting: Family Medicine

## 2013-05-07 VITALS — BP 130/60 | HR 79 | Temp 99.2°F | Wt 166.0 lb

## 2013-05-07 DIAGNOSIS — M25552 Pain in left hip: Secondary | ICD-10-CM

## 2013-05-07 DIAGNOSIS — S76219A Strain of adductor muscle, fascia and tendon of unspecified thigh, initial encounter: Secondary | ICD-10-CM

## 2013-05-07 DIAGNOSIS — IMO0002 Reserved for concepts with insufficient information to code with codable children: Secondary | ICD-10-CM

## 2013-05-07 DIAGNOSIS — M25559 Pain in unspecified hip: Secondary | ICD-10-CM

## 2013-05-07 NOTE — Progress Notes (Signed)
Pre visit review using our clinic review tool, if applicable. No additional management support is needed unless otherwise documented below in the visit note. 

## 2013-05-07 NOTE — Progress Notes (Signed)
   Subjective:    Patient ID: Gabriel [name], male    DOB: 1924-09-12, 80 y.o.   MRN: [id number]  HPI Here for 2 days of pain in the left groin. No trauma that he knows of, but he remembers moving some 50 gallon drums of kerosene 2 weeks ago. Then this past weekend he blew some leaves in his yard. The pain felt better with Ibuprofen today.    Review of Systems  Constitutional: Negative.        Objective:   Physical Exam  Constitutional: He appears well-developed and well-nourished.  Musculoskeletal:  The left leg is normal on exam, not tender, no swelling, full ROM           Assessment & Plan:  This is an apparent groin strain and it should resolve in the next week or so. Rest, heat and Ibuprofen prn

## 2013-05-17 ENCOUNTER — Telehealth: Payer: Self-pay | Admitting: Family Medicine

## 2013-05-17 ENCOUNTER — Ambulatory Visit (INDEPENDENT_AMBULATORY_CARE_PROVIDER_SITE_OTHER)
Admission: RE | Admit: 2013-05-17 | Discharge: 2013-05-17 | Disposition: A | Discharge status: Home / Self Care | Payer: Medicare Other | Source: Ambulatory Visit | Attending: Family Medicine | Admitting: Family Medicine

## 2013-05-17 DIAGNOSIS — M169 Osteoarthritis of hip, unspecified: Secondary | ICD-10-CM | POA: Diagnosis not present

## 2013-05-17 DIAGNOSIS — M25552 Pain in left hip: Secondary | ICD-10-CM

## 2013-05-17 DIAGNOSIS — M25559 Pain in unspecified hip: Secondary | ICD-10-CM

## 2013-05-17 DIAGNOSIS — M1610 Unilateral primary osteoarthritis, unspecified hip: Secondary | ICD-10-CM | POA: Diagnosis not present

## 2013-05-17 NOTE — Telephone Encounter (Signed)
Patient Information:  Caller Name: Kie  Phone: [phone]  Patient: Gabriel [name], Gabriel [name]  Gender: Male  DOB: 1924/10/26  Age: 80 Years  PCP: Alysia Penna [facility])  Office Follow Up:  Does the office need to follow up with this patient?: Yes  Instructions For The Office: Patient still complains of pain and discomfort.  Seeking Guidance before scheduling appt. since just being seen.   PLEASE CONTACT  RN Note:  Patient still complains of pain and discomfort.  Seeking Guidance before scheduling appt. since just being seen.   PLEASE CONTACT  Symptoms  Reason For Call & Symptoms: Patient complains of pain Left hip and radiates to groin and leg.  Seen in the office on 05/07/13 and evaluated by Dr. Sarajane Jews.  Patient states pain is more in left hip , with "soreness" in groin. (started after pushing 55 gallon drum).  Discomfort with lifting leg .  Treated with Ibuprofen but stopped 05/10/13 and started Tylenol Arthritis.  Only some better- "hurt all day yesterday".  No bruising or swelling noted.  Reviewed Health History In EMR: Yes  Reviewed Medications In EMR: Yes  Reviewed Allergies In EMR: Yes  Reviewed Surgeries / Procedures: Yes  Date of Onset of Symptoms: 04/23/2013  Treatments Tried: Ibuprofen, Tylenol Arthritis  Treatments Tried Worked: No  Guideline(s) Used:  Leg Pain  Disposition Per Guideline:   See Within 3 Days in Office  Reason For Disposition Reached:   Moderate pain (e.g., interferes with normal activities, limping) and present > 3 days  Advice Given:  Pain Medicines:  For pain relief, you can take either acetaminophen, ibuprofen, or naproxen.  They are over-the-counter (OTC) pain drugs. You can buy them at the drugstore.  Ibuprofen (e.g., Motrin, Advil):  Take 400 mg (two 200 mg pills) by mouth every 6 hours.  Apply Heat to the Area:   Beginning 48 hours after an injury, apply a warm washcloth or heating pad for 10 minutes 3 times a day.  This will help increase  blood flow and improve healing.  Rest:   You should try to avoid any exercise or activity that caused this pain for the next 3 days.  Call Back If:  You become worse.  RN Overrode Recommendation:  Document Patient  Patient still complains of pain and discomfort.  Seeking Guidance before scheduling appt. since just being seen.   PLEASE CONTACT

## 2013-05-17 NOTE — Telephone Encounter (Signed)
I set up an Xray of the hip at [facility], so have him get this done as soon as he can

## 2013-05-17 NOTE — Telephone Encounter (Signed)
I spoke with pt and he is going to get this xray done ASAP.

## 2013-05-18 NOTE — Addendum Note (Signed)
Addended by: Alysia Penna A on: 05/18/2013 04:35 PM   Modules accepted: Orders

## 2013-05-22 ENCOUNTER — Telehealth: Payer: Self-pay | Admitting: Family Medicine

## 2013-05-22 NOTE — Telephone Encounter (Signed)
Pt had x-ray on hip at the [place] [facility] on 05/18/13, need referral for specialist pending x-ray results

## 2013-05-23 NOTE — Telephone Encounter (Signed)
I spoke with pt  

## 2013-05-23 NOTE — Telephone Encounter (Signed)
[facility] got this referral this morning

## 2013-05-31 DIAGNOSIS — M1610 Unilateral primary osteoarthritis, unspecified hip: Secondary | ICD-10-CM | POA: Diagnosis not present

## 2013-05-31 DIAGNOSIS — M169 Osteoarthritis of hip, unspecified: Secondary | ICD-10-CM | POA: Diagnosis not present

## 2013-06-26 DIAGNOSIS — M169 Osteoarthritis of hip, unspecified: Secondary | ICD-10-CM | POA: Diagnosis not present

## 2013-06-26 DIAGNOSIS — M1610 Unilateral primary osteoarthritis, unspecified hip: Secondary | ICD-10-CM | POA: Diagnosis not present

## 2013-08-27 ENCOUNTER — Emergency Department (HOSPITAL_COMMUNITY)
Admission: EM | Admit: 2013-08-27 | Discharge: 2013-08-27 | Disposition: A | Discharge status: Home / Self Care | Payer: Medicare Other | Attending: Emergency Medicine | Admitting: Emergency Medicine

## 2013-08-27 ENCOUNTER — Emergency Department (HOSPITAL_COMMUNITY): Payer: Medicare Other

## 2013-08-27 ENCOUNTER — Encounter (HOSPITAL_COMMUNITY): Payer: Self-pay | Admitting: Emergency Medicine

## 2013-08-27 DIAGNOSIS — IMO0002 Reserved for concepts with insufficient information to code with codable children: Secondary | ICD-10-CM | POA: Diagnosis not present

## 2013-08-27 DIAGNOSIS — J984 Other disorders of lung: Secondary | ICD-10-CM | POA: Diagnosis not present

## 2013-08-27 DIAGNOSIS — S0993XA Unspecified injury of face, initial encounter: Secondary | ICD-10-CM | POA: Diagnosis not present

## 2013-08-27 DIAGNOSIS — I10 Essential (primary) hypertension: Secondary | ICD-10-CM | POA: Insufficient documentation

## 2013-08-27 DIAGNOSIS — K219 Gastro-esophageal reflux disease without esophagitis: Secondary | ICD-10-CM | POA: Insufficient documentation

## 2013-08-27 DIAGNOSIS — Y9289 Other specified places as the place of occurrence of the external cause: Secondary | ICD-10-CM | POA: Insufficient documentation

## 2013-08-27 DIAGNOSIS — W1809XA Striking against other object with subsequent fall, initial encounter: Secondary | ICD-10-CM | POA: Insufficient documentation

## 2013-08-27 DIAGNOSIS — Y9301 Activity, walking, marching and hiking: Secondary | ICD-10-CM | POA: Insufficient documentation

## 2013-08-27 DIAGNOSIS — Z87891 Personal history of nicotine dependence: Secondary | ICD-10-CM | POA: Insufficient documentation

## 2013-08-27 DIAGNOSIS — R6889 Other general symptoms and signs: Secondary | ICD-10-CM | POA: Diagnosis not present

## 2013-08-27 DIAGNOSIS — R911 Solitary pulmonary nodule: Secondary | ICD-10-CM | POA: Diagnosis not present

## 2013-08-27 DIAGNOSIS — Z79899 Other long term (current) drug therapy: Secondary | ICD-10-CM | POA: Insufficient documentation

## 2013-08-27 DIAGNOSIS — S0031XA Abrasion of nose, initial encounter: Secondary | ICD-10-CM

## 2013-08-27 DIAGNOSIS — S0990XA Unspecified injury of head, initial encounter: Secondary | ICD-10-CM | POA: Diagnosis not present

## 2013-08-27 DIAGNOSIS — S022XXA Fracture of nasal bones, initial encounter for closed fracture: Secondary | ICD-10-CM

## 2013-08-27 DIAGNOSIS — Z7982 Long term (current) use of aspirin: Secondary | ICD-10-CM | POA: Insufficient documentation

## 2013-08-27 DIAGNOSIS — R040 Epistaxis: Secondary | ICD-10-CM | POA: Diagnosis not present

## 2013-08-27 DIAGNOSIS — W19XXXA Unspecified fall, initial encounter: Secondary | ICD-10-CM

## 2013-08-27 NOTE — Discharge Instructions (Signed)
You were seen and evaluated for your injuries after a fall. You were found to have fractures to your nasal bones. Continue to use ice over the area to reduce swelling. Followup with your primary care provider for follow up on your pulmonary nodule and a maxillofacial specialist for continued evaluation and treatment of your nose injury.    Nasal Fracture A fracture is a break in the bone. A nasal fracture is a broken nose. Minor breaks do not need treatment. Serious breaks may need surgery.  HOME CARE  Put ice on the injured area.  Put ice in a plastic bag.  Place a towel between your skin and the bag.  Leave the ice on for 15-20 minutes, 03-04 times a day.  Only take medicine as told by your doctor.  If your nose bleeds, squeeze your nose shut gently. Sit upright for 10 minutes.  Do not play contact sports for 3 to 4 weeks or as told by your doctor. GET HELP RIGHT AWAY IF:   You have more pain or severe pain.  You keep having nosebleeds.  The shape of your nose does not return to normal after 5 days.  You have yellowish white fluid (pus) coming from your nose.  Your nose bleeds for over 20 minutes.  Clear fluid drains from your nose.  You have a grape-like puffiness (swelling) on the inside of your nose.  You have trouble moving your eyes.  You keep throwing up (vomiting). MAKE SURE YOU:   Understand these instructions.  Will watch this condition.  Will get help right away if you are not doing well or get worse. Document Released: 01/20/2008 Document Revised: 07/05/2011 Document Reviewed: 07/27/2010 [facility] Patient Information 2014 [place].    Pulmonary Nodule  A pulmonary nodule is a small, round spot in your lung. It is usually found when pictures of your lungs are taken for other reasons. Most pulmonary nodules are not cancerous and do not cause symptoms. Tests will be done to make sure the nodule is not cancerous. Pulmonary nodules that are not  cancerous usually do not require treatment. HOME CARE   Only take medicine as told by your doctor.  Follow up with your doctor as told. GET HELP IF:  You have trouble breathing when doing activities.  You feel sick or more tired than normal.  You do not feel like eating.  You lose weight without trying to.  You have chills.  You have night sweats. GET HELP RIGHT AWAY IF:  You cannot catch your breath.  You start making whistling sounds when breathing (wheezing).  You have a cough that does not go away.  You cough up blood.  You are dizzy or feel like you are going to pass out.  You have sudden chest pain.  You have a fever or lasting symptoms for more than 2 3 days.  You have a fever and your symptoms suddenly get worse. MAKE SURE YOU:  Understand these instructions.  Will watch your condition.  Will get help right away if you are not doing well or get worse. Document Released: 05/15/2010 Document Revised: 12/13/2012 Document Reviewed: 10/02/2012 [facility] Patient Information 2014 [place], [place].

## 2013-08-27 NOTE — ED Provider Notes (Signed)
CSN: [id number]     Arrival date & time 08/27/13  1940 History  This chart was scribed for non-physician practitioner, Cleatrice Burke, PA-C working with Merryl Hacker, MD by Frederich Balding, ED scribe. This patient was seen in room WTR7/WTR7 and the patient's care was started at 8:42 PM.   Chief Complaint  Patient presents with  . Fall  . Facial Injury   The history is provided by the patient. No language interpreter was used.   HPI Comments: Gabriel [name] is a 80 y.o. male who presents to the Emergency Department complaining of a fall that occurred earlier tonight. Pt was walking on the sidewalk, lost his balance on uneven pavement and landed on his face. Denies LOC. He has sudden onset nose pain with associated swelling and abrasions. Pt took ibuprofen before the injury occurred. Denies wrist pain, arm pain, shoulder pain, dizziness, headache, confusion, speech problems, weakness, numbness. Pt does not know when his last tetanus was but states it should be in his records. No other aggravating or alleviating factors.  No other associated symptoms.   Past Medical History  Diagnosis Date  . Hypertension   . GERD (gastroesophageal reflux disease)   . Diverticulosis of colon    Past Surgical History  Procedure Laterality Date  . Colonoscopy  09/23/04    per Dr. [name], repeat in 10 years  . Rt inguinal hernia      x2  . Egd and esophageal diatation,  05-10-11    per Dr. [name]    . Cataract extaction, right  6/09    per Dr. [name]  . Hernia repair     Family History  Problem Relation Age of Onset  . Coronary artery disease Father   . Colon cancer Neg Hx   . Stomach cancer Neg Hx   . Esophageal cancer Neg Hx    History  Substance Use Topics  . Smoking status: Former Research scientist (life sciences)  . Smokeless tobacco: Never Used  . Alcohol Use: No    Review of Systems  HENT: Positive for facial swelling.   Musculoskeletal: Negative for arthralgias.  Skin: Positive for wound.  Neurological:  Negative for dizziness, weakness, numbness and headaches.  Psychiatric/Behavioral: Negative for confusion.  All other systems reviewed and are negative.  Allergies  Review of patient's allergies indicates no known allergies.  Home Medications   Prior to Admission medications   Medication Sig Start Date End Date Taking? Authorizing Provider  aspirin 81 MG tablet Take 81 mg by mouth daily.      Historical Provider, MD  hydrochlorothiazide (HYDRODIURIL) 25 MG tablet Take 1 tablet (25 mg total) by mouth every morning. 09/15/12   Laurey Morale, MD  Multiple Vitamin (MULTIVITAMIN PO) Take by mouth daily.      Historical Provider, MD  pantoprazole (PROTONIX) 40 MG tablet Take 1 tablet (40 mg total) by mouth daily. 09/15/12   Laurey Morale, MD  vitamin C (ASCORBIC ACID) 500 MG tablet Take 500 mg by mouth daily.     Historical Provider, MD   BP 158/61  Pulse 58  Temp(Src) 98 F (36.7 C) (Oral)  Resp 16  SpO2 95%  Physical Exam  Nursing note and vitals reviewed. Constitutional: He is oriented to person, place, and time. He appears well-developed and well-nourished. No distress.  HENT:  Head: Normocephalic and atraumatic.  Abrasions and swelling over nose. No septal hematoma. Blood present in nostrils.  Pt still has air movement in both nostrils.  Eyes: Conjunctivae and  EOM are normal. Pupils are equal, round, and reactive to light.  Neck: Normal range of motion. Neck supple. No tracheal deviation present.  No cervical midline tenderness.  Cardiovascular: Normal rate, regular rhythm and normal heart sounds.   Pulmonary/Chest: Effort normal and breath sounds normal. No respiratory distress. He has no wheezes. He has no rales.  Musculoskeletal: Normal range of motion.  No midline spine tenderness. Small abrasions noted to right fingers. No deformities.  Neurological: He is alert and oriented to person, place, and time. He has normal strength. No cranial nerve deficit or sensory deficit. Gait  normal.  Skin: Skin is warm and dry.  Psychiatric: He has a normal mood and affect. His behavior is normal.    ED Course  Procedures   DIAGNOSTIC STUDIES: Oxygen Saturation is 95% on RA, adequate by my interpretation.    COORDINATION OF CARE: 8:47 PM-Discussed treatment plan which includes head CT with pt at bedside and pt agreed to plan.   Last tetanus reviewed in records in 2009.  Pt continues to be doing well.  No bleeding from nose at this time.  CT scans and x-rays reviewed.  Nasal bone fx's without other concerning acute injury.  An incidental pulmonary nodule noted. Discussed with patient and family. Patient advised to follow with PCP.   Imaging Review Dg Nasal Bones  08/27/2013   CLINICAL DATA:  Fall, hit face on the ground, nose pain/redness/swelling  EXAM: NASAL BONES - 3+ VIEW  COMPARISON:  None.  FINDINGS: Bilateral nondisplaced nasal bone fractures.  Nasal septum appears midline.  No air-fluid levels in the visualized paranasal sinuses.  IMPRESSION: Bilateral nondisplaced nasal bone fractures.   Electronically Signed   By: Julian Hy M.D.   On: 08/27/2013 20:19   Ct Head Wo Contrast  08/27/2013   CLINICAL DATA:  Fall, facial injury  EXAM: CT HEAD WITHOUT CONTRAST  CT CERVICAL SPINE WITHOUT CONTRAST  TECHNIQUE: Multidetector CT imaging of the head and cervical spine was performed following the standard protocol without intravenous contrast. Multiplanar CT image reconstructions of the cervical spine were also generated.  COMPARISON:  None.  FINDINGS: CT HEAD FINDINGS  There is no evidence of mass effect, midline shift, or extra-axial fluid collections. There is no evidence of a space-occupying lesion or intracranial hemorrhage. There is no evidence of a cortical-based area of acute infarction. There is generalized cerebral atrophy. There is periventricular white matter low attenuation likely secondary to microangiopathy.  The ventricles and sulci are appropriate for the  patient's age. The basal cisterns are patent.  Visualized portions of the orbits are unremarkable. There is a small left maxillary sinus air-fluid level. There is partial opacification of the left mastoid sinus. Cerebrovascular atherosclerotic calcifications are noted.  The calvarium is intact. There is a mildly displaced age-indeterminate mildly comminuted right nasal bone fracture. There is a nondisplaced left nasal bone fracture.  CT CERVICAL SPINE FINDINGS  The alignment is anatomic. The vertebral body heights are maintained. There is loss of the normal cervical lordosis with straightening. There is no acute fracture. There is no static listhesis. The prevertebral soft tissues are normal. The intraspinal soft tissues are not fully imaged on this examination due to poor soft tissue contrast, but there is no gross soft tissue abnormality.  There is degenerative disc disease in the cervical spine most severe at C4-5, C5-6 and C6-7 with disc space narrowing and discogenic endplate osteophytes. There is right uncovertebral degenerative change at C3-4 with foraminal encroachment. There is bilateral uncovertebral  degenerative change at C4-5 with bilateral foraminal encroachment. There is bilateral uncovertebral degenerative change at C6-7 with bilateral foraminal encroachment.  There is a 3 mm subpleural left apical pulmonary nodule.  IMPRESSION: 1. No acute intracranial pathology. 2. No acute osseous injury of the cervical spine. 3. Cervical spine spondylosis as described above. 4. Age indeterminate bilateral nasal bone fractures.   Electronically Signed   By: Kathreen Devoid   On: 08/27/2013 21:06   Ct Cervical Spine Wo Contrast  08/27/2013   CLINICAL DATA:  Fall, facial injury  EXAM: CT HEAD WITHOUT CONTRAST  CT CERVICAL SPINE WITHOUT CONTRAST  TECHNIQUE: Multidetector CT imaging of the head and cervical spine was performed following the standard protocol without intravenous contrast. Multiplanar CT image  reconstructions of the cervical spine were also generated.  COMPARISON:  None.  FINDINGS: CT HEAD FINDINGS  There is no evidence of mass effect, midline shift, or extra-axial fluid collections. There is no evidence of a space-occupying lesion or intracranial hemorrhage. There is no evidence of a cortical-based area of acute infarction. There is generalized cerebral atrophy. There is periventricular white matter low attenuation likely secondary to microangiopathy.  The ventricles and sulci are appropriate for the patient's age. The basal cisterns are patent.  Visualized portions of the orbits are unremarkable. There is a small left maxillary sinus air-fluid level. There is partial opacification of the left mastoid sinus. Cerebrovascular atherosclerotic calcifications are noted.  The calvarium is intact. There is a mildly displaced age-indeterminate mildly comminuted right nasal bone fracture. There is a nondisplaced left nasal bone fracture.  CT CERVICAL SPINE FINDINGS  The alignment is anatomic. The vertebral body heights are maintained. There is loss of the normal cervical lordosis with straightening. There is no acute fracture. There is no static listhesis. The prevertebral soft tissues are normal. The intraspinal soft tissues are not fully imaged on this examination due to poor soft tissue contrast, but there is no gross soft tissue abnormality.  There is degenerative disc disease in the cervical spine most severe at C4-5, C5-6 and C6-7 with disc space narrowing and discogenic endplate osteophytes. There is right uncovertebral degenerative change at C3-4 with foraminal encroachment. There is bilateral uncovertebral degenerative change at C4-5 with bilateral foraminal encroachment. There is bilateral uncovertebral degenerative change at C6-7 with bilateral foraminal encroachment.  There is a 3 mm subpleural left apical pulmonary nodule.  IMPRESSION: 1. No acute intracranial pathology. 2. No acute osseous injury of  the cervical spine. 3. Cervical spine spondylosis as described above. 4. Age indeterminate bilateral nasal bone fractures.   Electronically Signed   By: Kathreen Devoid   On: 08/27/2013 21:06     MDM   Final diagnoses:  Fall  Nasal bone fractures  Abrasion of nose  Pulmonary nodule      I personally performed the services described in this documentation, which was scribed in my presence. The recorded information has been reviewed and is accurate.  Martie Lee, PA-C 08/27/13 [id number]

## 2013-08-27 NOTE — ED Notes (Signed)
Pt was walking on sidewalk and lost his balance on uneven pavement, he has a swollen nose, bleeding controlled, skin abrasion on face

## 2013-08-28 ENCOUNTER — Telehealth: Payer: Self-pay | Admitting: Family Medicine

## 2013-08-28 ENCOUNTER — Other Ambulatory Visit: Payer: Self-pay | Admitting: Family Medicine

## 2013-08-28 DIAGNOSIS — R911 Solitary pulmonary nodule: Secondary | ICD-10-CM

## 2013-08-28 NOTE — Telephone Encounter (Signed)
I spoke with Rose in [place] and pt will need to have a BMET drawn before they can do the scan, because it is with contrast. Per Dr. Sarajane Jews okay to order labs and I did put in the computer. I called pt and left a voice message to schedule the lab appointment.

## 2013-08-28 NOTE — Telephone Encounter (Signed)
Pt is aware of test ordered.

## 2013-08-28 NOTE — Telephone Encounter (Signed)
I spoke with pt and went over below information. 

## 2013-08-28 NOTE — Telephone Encounter (Signed)
Pt was seen at [facility] ER last night and they did CT.  Pt states there was a spot found on his lung and wants Dr. Sarajane Jews to review the CT.

## 2013-08-28 NOTE — Telephone Encounter (Signed)
I will set him up for a more extensive CT of his entire chest so we can get a better look

## 2013-08-29 NOTE — ED Provider Notes (Signed)
Medical screening examination/treatment/procedure(s) were performed by non-physician practitioner and as supervising physician I was immediately available for consultation/collaboration.   EKG Interpretation None       Merryl Hacker, MD 08/29/13 [phone]

## 2013-08-30 ENCOUNTER — Other Ambulatory Visit (INDEPENDENT_AMBULATORY_CARE_PROVIDER_SITE_OTHER): Payer: Medicare Other

## 2013-08-30 DIAGNOSIS — R911 Solitary pulmonary nodule: Secondary | ICD-10-CM | POA: Diagnosis not present

## 2013-08-30 LAB — BASIC METABOLIC PANEL
BUN: 19 mg/dL (ref 6–23)
CO2: 27 mEq/L (ref 19–32)
Calcium: 9 mg/dL (ref 8.4–10.5)
Chloride: 104 mEq/L (ref 96–112)
Creatinine, Ser: 0.9 mg/dL (ref 0.4–1.5)
GFR: 88.24 mL/min (ref >60.00)
Glucose, Bld: 98 mg/dL (ref 70–99)
POTASSIUM: 3.7 meq/L (ref 3.5–5.1)
Sodium: 138 mEq/L (ref 135–145)

## 2013-09-03 ENCOUNTER — Ambulatory Visit: Payer: Medicare Other

## 2013-09-04 ENCOUNTER — Ambulatory Visit (INDEPENDENT_AMBULATORY_CARE_PROVIDER_SITE_OTHER)
Admission: RE | Admit: 2013-09-04 | Discharge: 2013-09-04 | Disposition: A | Discharge status: Home / Self Care | Payer: Medicare Other | Source: Ambulatory Visit | Attending: Family Medicine | Admitting: Family Medicine

## 2013-09-04 DIAGNOSIS — R911 Solitary pulmonary nodule: Secondary | ICD-10-CM

## 2013-09-04 DIAGNOSIS — J8410 Pulmonary fibrosis, unspecified: Secondary | ICD-10-CM | POA: Diagnosis not present

## 2013-09-04 MED ORDER — IOHEXOL 300 MG/ML  SOLN
80.0000 mL | Freq: Once | INTRAMUSCULAR | Status: AC | PRN
  Administered 2013-09-04: 80 mL via INTRAVENOUS

## 2013-09-11 ENCOUNTER — Ambulatory Visit (INDEPENDENT_AMBULATORY_CARE_PROVIDER_SITE_OTHER): Payer: Medicare Other | Admitting: Family Medicine

## 2013-09-11 ENCOUNTER — Encounter: Payer: Self-pay | Admitting: Family Medicine

## 2013-09-11 VITALS — BP 141/70 | HR 64 | Temp 98.6°F | Ht 65.5 in | Wt 162.0 lb

## 2013-09-11 DIAGNOSIS — L509 Urticaria, unspecified: Secondary | ICD-10-CM

## 2013-09-11 MED ORDER — HYDROCHLOROTHIAZIDE 25 MG PO TABS: 25.0000 mg | ORAL_TABLET | ORAL | Status: DC

## 2013-09-11 MED ORDER — PANTOPRAZOLE SODIUM 40 MG PO TBEC: 40.0000 mg | DELAYED_RELEASE_TABLET | Freq: Every day | ORAL | Status: DC

## 2013-09-11 NOTE — Progress Notes (Signed)
Pre visit review using our clinic review tool, if applicable. No additional management support is needed unless otherwise documented below in the visit note. 

## 2013-09-12 ENCOUNTER — Encounter: Payer: Self-pay | Admitting: Family Medicine

## 2013-09-12 NOTE — Progress Notes (Signed)
   Subjective:    Patient ID: Gabriel [name], male    DOB: 14-Jan-1925, 80 y.o.   MRN: [id number]  HPI Here for an itchy rash that appeared 2 days ago. It started on his abdomen and has ow spread all over the body except for the face. No SOB. No new medications or laundry detergent, etc. However he did have a contrasted chest CT on 09-04-13, and he does not recall ever having IV contrast with an Xray before.    Review of Systems  Constitutional: Negative.   Respiratory: Negative.   Skin: Positive for rash.       Objective:   Physical Exam  Constitutional: He is oriented to person, place, and time. He appears well-developed and well-nourished.  Pulmonary/Chest: Effort normal and breath sounds normal.  Neurological: He is alert and oriented to person, place, and time.  Skin:  Diffuse urticaria over trunk and extremities           Assessment & Plan:  Probable delayed reaction to IV contrast. He will drink fluids and take Benadryl every 4 hours prn. Recheck prn

## 2013-09-14 ENCOUNTER — Telehealth: Payer: Self-pay | Admitting: Family Medicine

## 2013-09-14 MED ORDER — METHYLPREDNISOLONE 4 MG PO KIT: PACK | ORAL | Status: DC

## 2013-09-14 NOTE — Telephone Encounter (Signed)
I sent script e-scribe and spoke with pt. 

## 2013-09-14 NOTE — Telephone Encounter (Signed)
Pt was seen on Tuesday, regarding a rash on his body from dye that was put in veins, dr. Sarajane Jews informed him to use benadryl, pt states he has used all of the benadryl still experiencing the rash and itching wants to know what other options he has.

## 2013-09-14 NOTE — Telephone Encounter (Signed)
Call in a Medrol Dose pack  

## 2013-09-18 ENCOUNTER — Ambulatory Visit (INDEPENDENT_AMBULATORY_CARE_PROVIDER_SITE_OTHER): Payer: Medicare Other | Admitting: Family Medicine

## 2013-09-18 ENCOUNTER — Encounter: Payer: Self-pay | Admitting: Family Medicine

## 2013-09-18 VITALS — BP 151/78 | HR 53 | Temp 98.2°F | Ht 65.5 in | Wt 155.0 lb

## 2013-09-18 DIAGNOSIS — K219 Gastro-esophageal reflux disease without esophagitis: Secondary | ICD-10-CM

## 2013-09-18 DIAGNOSIS — K5730 Diverticulosis of large intestine without perforation or abscess without bleeding: Secondary | ICD-10-CM

## 2013-09-18 DIAGNOSIS — I10 Essential (primary) hypertension: Secondary | ICD-10-CM | POA: Diagnosis not present

## 2013-09-18 DIAGNOSIS — I498 Other specified cardiac arrhythmias: Secondary | ICD-10-CM

## 2013-09-18 DIAGNOSIS — J309 Allergic rhinitis, unspecified: Secondary | ICD-10-CM

## 2013-09-18 DIAGNOSIS — N138 Other obstructive and reflux uropathy: Secondary | ICD-10-CM

## 2013-09-18 DIAGNOSIS — N401 Enlarged prostate with lower urinary tract symptoms: Secondary | ICD-10-CM

## 2013-09-18 DIAGNOSIS — N139 Obstructive and reflux uropathy, unspecified: Secondary | ICD-10-CM

## 2013-09-18 LAB — BASIC METABOLIC PANEL
BUN: 23 mg/dL (ref 6–23)
CALCIUM: 9.6 mg/dL (ref 8.4–10.5)
CO2: 29 meq/L (ref 19–32)
Chloride: 99 mEq/L (ref 96–112)
Creatinine, Ser: 0.9 mg/dL (ref 0.4–1.5)
GFR: 87.08 mL/min (ref >60.00)
GLUCOSE: 86 mg/dL (ref 70–99)
Potassium: 4 mEq/L (ref 3.5–5.1)
Sodium: 137 mEq/L (ref 135–145)

## 2013-09-18 LAB — LIPID PANEL
CHOLESTEROL: 125 mg/dL (ref 0–200)
HDL: 36.2 mg/dL — AB (ref >39.00)
LDL Cholesterol: 72 mg/dL (ref 0–99)
TRIGLYCERIDES: 83 mg/dL (ref 0.0–149.0)
Total CHOL/HDL Ratio: 3
VLDL: 16.6 mg/dL (ref 0.0–40.0)

## 2013-09-18 LAB — CBC WITH DIFFERENTIAL/PLATELET
BASOS ABS: 0 10*3/uL (ref 0.0–0.1)
Basophils Relative: 0.3 % (ref 0.0–3.0)
EOS PCT: 1.8 % (ref 0.0–5.0)
Eosinophils Absolute: 0.2 10*3/uL (ref 0.0–0.7)
HEMATOCRIT: 46.8 % (ref 39.0–52.0)
Hemoglobin: 15.8 g/dL (ref 13.0–17.0)
LYMPHS ABS: 3.1 10*3/uL (ref 0.7–4.0)
Lymphocytes Relative: 30.8 % (ref 12.0–46.0)
MCHC: 33.7 g/dL (ref 30.0–36.0)
MCV: 92.9 fl (ref 78.0–100.0)
MONO ABS: 1.2 10*3/uL — AB (ref 0.1–1.0)
Monocytes Relative: 11.7 % (ref 3.0–12.0)
NEUTROS PCT: 55.4 % (ref 43.0–77.0)
Neutro Abs: 5.6 10*3/uL (ref 1.4–7.7)
PLATELETS: 305 10*3/uL (ref 150.0–400.0)
RBC: 5.04 Mil/uL (ref 4.22–5.81)
RDW: 13.9 % (ref 11.5–15.5)
WBC: 10.1 10*3/uL (ref 4.0–10.5)

## 2013-09-18 LAB — HEPATIC FUNCTION PANEL
ALBUMIN: 3.8 g/dL (ref 3.5–5.2)
ALT: 36 U/L (ref 0–53)
AST: 29 U/L (ref 0–37)
Alkaline Phosphatase: 71 U/L (ref 39–117)
Bilirubin, Direct: 0.3 mg/dL (ref 0.0–0.3)
TOTAL PROTEIN: 6.7 g/dL (ref 6.0–8.3)
Total Bilirubin: 1.6 mg/dL — ABNORMAL HIGH (ref 0.2–1.2)

## 2013-09-18 LAB — POCT URINALYSIS DIPSTICK
Bilirubin, UA: NEGATIVE
Blood, UA: NEGATIVE
GLUCOSE UA: NEGATIVE
Ketones, UA: NEGATIVE
Leukocytes, UA: NEGATIVE
NITRITE UA: NEGATIVE
SPEC GRAV UA: 1.01
UROBILINOGEN UA: 0.2
pH, UA: 7

## 2013-09-18 LAB — TSH: TSH: 3.11 u[IU]/mL (ref 0.35–4.50)

## 2013-09-18 LAB — PSA: PSA: 2.24 ng/mL (ref 0.10–4.00)

## 2013-09-18 NOTE — Progress Notes (Signed)
   Subjective:    Patient ID: Gabriel [name], male    DOB: 04-14-1925, 80 y.o.   MRN: [id number]  HPI 80 yr old male for a cpx. He feels well. He is finishing up a course of prednisone for hives, and this has resolved. His BP is stable.    Review of Systems  Constitutional: Negative.   HENT: Negative.   Eyes: Negative.   Respiratory: Negative.   Cardiovascular: Negative.   Gastrointestinal: Negative.   Genitourinary: Negative.   Musculoskeletal: Negative.   Skin: Negative.   Neurological: Negative.   Psychiatric/Behavioral: Negative.        Objective:   Physical Exam  Constitutional: He is oriented to person, place, and time. He appears well-developed and well-nourished. No distress.  HENT:  Head: Normocephalic and atraumatic.  Right Ear: External ear normal.  Left Ear: External ear normal.  Nose: Nose normal.  Mouth/Throat: Oropharynx is clear and moist. No oropharyngeal exudate.  Eyes: Conjunctivae and EOM are normal. Pupils are equal, round, and reactive to light. Right eye exhibits no discharge. Left eye exhibits no discharge. No scleral icterus.  Neck: Neck supple. No JVD present. No tracheal deviation present. No thyromegaly present.  Cardiovascular: Normal rate, regular rhythm, normal heart sounds and intact distal pulses.  Exam reveals no gallop and no friction rub.   No murmur heard. EKG at baseline with first degree AVB and a few PVCs  Pulmonary/Chest: Effort normal and breath sounds normal. No respiratory distress. He has no wheezes. He has no rales. He exhibits no tenderness.  Abdominal: Soft. Bowel sounds are normal. He exhibits no distension and no mass. There is no tenderness. There is no rebound and no guarding.  Genitourinary: Rectum normal and penis normal. Guaiac negative stool. No penile tenderness.  Prostate mildly enlarged and firm   Musculoskeletal: Normal range of motion. He exhibits no edema and no tenderness.  Lymphadenopathy:    He has no cervical  adenopathy.  Neurological: He is alert and oriented to person, place, and time. He has normal reflexes. No cranial nerve deficit. He exhibits normal muscle tone. Coordination normal.  Skin: Skin is warm and dry. No rash noted. He is not diaphoretic. No erythema. No pallor.  Psychiatric: He has a normal mood and affect. His behavior is normal. Judgment and thought content normal.          Assessment & Plan:  Well exam. Get fasting labs

## 2013-09-18 NOTE — Progress Notes (Signed)
Pre visit review using our clinic review tool, if applicable. No additional management support is needed unless otherwise documented below in the visit note. 

## 2013-10-24 DIAGNOSIS — C44319 Basal cell carcinoma of skin of other parts of face: Secondary | ICD-10-CM | POA: Diagnosis not present

## 2013-10-24 DIAGNOSIS — L570 Actinic keratosis: Secondary | ICD-10-CM | POA: Diagnosis not present

## 2013-10-24 DIAGNOSIS — D235 Other benign neoplasm of skin of trunk: Secondary | ICD-10-CM | POA: Diagnosis not present

## 2013-11-27 DIAGNOSIS — L570 Actinic keratosis: Secondary | ICD-10-CM | POA: Diagnosis not present

## 2013-11-27 DIAGNOSIS — H01019 Ulcerative blepharitis unspecified eye, unspecified eyelid: Secondary | ICD-10-CM | POA: Diagnosis not present

## 2013-12-20 DIAGNOSIS — M1610 Unilateral primary osteoarthritis, unspecified hip: Secondary | ICD-10-CM | POA: Diagnosis not present

## 2013-12-20 DIAGNOSIS — M169 Osteoarthritis of hip, unspecified: Secondary | ICD-10-CM | POA: Diagnosis not present

## 2013-12-28 DIAGNOSIS — Z961 Presence of intraocular lens: Secondary | ICD-10-CM | POA: Diagnosis not present

## 2013-12-28 DIAGNOSIS — H2510 Age-related nuclear cataract, unspecified eye: Secondary | ICD-10-CM | POA: Diagnosis not present

## 2013-12-28 DIAGNOSIS — H02109 Unspecified ectropion of unspecified eye, unspecified eyelid: Secondary | ICD-10-CM | POA: Diagnosis not present

## 2013-12-28 DIAGNOSIS — H353190 Nonexudative age-related macular degeneration, unspecified eye, stage unspecified: Secondary | ICD-10-CM | POA: Diagnosis not present

## 2013-12-28 DIAGNOSIS — H52229 Regular astigmatism, unspecified eye: Secondary | ICD-10-CM | POA: Diagnosis not present

## 2013-12-28 DIAGNOSIS — H5200 Hypermetropia, unspecified eye: Secondary | ICD-10-CM | POA: Diagnosis not present

## 2013-12-28 DIAGNOSIS — H524 Presbyopia: Secondary | ICD-10-CM | POA: Diagnosis not present

## 2014-01-17 DIAGNOSIS — Z23 Encounter for immunization: Secondary | ICD-10-CM | POA: Diagnosis not present

## 2014-02-11 DIAGNOSIS — H02105 Unspecified ectropion of left lower eyelid: Secondary | ICD-10-CM | POA: Diagnosis not present

## 2014-02-11 DIAGNOSIS — H02102 Unspecified ectropion of right lower eyelid: Secondary | ICD-10-CM | POA: Diagnosis not present

## 2014-03-08 ENCOUNTER — Telehealth: Payer: Self-pay | Admitting: Family Medicine

## 2014-03-08 NOTE — Telephone Encounter (Signed)
Yes, he needs to stop the aspirin and the Meloxicam 5 days prior to the surgery

## 2014-03-08 NOTE — Telephone Encounter (Signed)
I spoke with pt  

## 2014-03-08 NOTE — Telephone Encounter (Signed)
Pt called to say that he is having eyelid surgery. Pt said he received a letter from [facility] advising him that he need to stop any medicine that has blood thinner a week before the surgery. Pt is asking if any of his meds contain blood thinner.

## 2014-04-01 DIAGNOSIS — H02102 Unspecified ectropion of right lower eyelid: Secondary | ICD-10-CM | POA: Diagnosis not present

## 2014-04-01 DIAGNOSIS — H04563 Stenosis of bilateral lacrimal punctum: Secondary | ICD-10-CM | POA: Diagnosis not present

## 2014-04-01 DIAGNOSIS — H02105 Unspecified ectropion of left lower eyelid: Secondary | ICD-10-CM | POA: Diagnosis not present

## 2014-05-14 DIAGNOSIS — Z08 Encounter for follow-up examination after completed treatment for malignant neoplasm: Secondary | ICD-10-CM | POA: Diagnosis not present

## 2014-05-14 DIAGNOSIS — L570 Actinic keratosis: Secondary | ICD-10-CM | POA: Diagnosis not present

## 2014-05-14 DIAGNOSIS — Z85828 Personal history of other malignant neoplasm of skin: Secondary | ICD-10-CM | POA: Diagnosis not present

## 2014-05-14 DIAGNOSIS — X32XXXD Exposure to sunlight, subsequent encounter: Secondary | ICD-10-CM | POA: Diagnosis not present

## 2014-05-15 ENCOUNTER — Encounter: Payer: Self-pay | Admitting: Family Medicine

## 2014-05-15 ENCOUNTER — Ambulatory Visit (INDEPENDENT_AMBULATORY_CARE_PROVIDER_SITE_OTHER): Payer: Medicare Other | Admitting: Family Medicine

## 2014-05-15 VITALS — BP 165/72 | HR 68 | Temp 98.0°F | Ht 65.5 in | Wt 169.0 lb

## 2014-05-15 DIAGNOSIS — J209 Acute bronchitis, unspecified: Secondary | ICD-10-CM | POA: Diagnosis not present

## 2014-05-15 MED ORDER — AZITHROMYCIN 250 MG PO TABS: ORAL_TABLET | ORAL | Status: DC

## 2014-05-15 NOTE — Progress Notes (Signed)
   Subjective:    Patient ID: Gabriel [name], male    DOB: 09/03/1924, 81 y.o.   MRN: [id number]  HPI Here for one week of PND, chest tightness and a dry cough. No fever.    Review of Systems  Constitutional: Negative.   HENT: Positive for congestion and postnasal drip. Negative for sinus pressure.   Eyes: Negative.   Respiratory: Positive for cough.        Objective:   Physical Exam  Constitutional: He appears well-developed and well-nourished.  HENT:  Right Ear: External ear normal.  Left Ear: External ear normal.  Nose: Nose normal.  Mouth/Throat: Oropharynx is clear and moist.  Eyes: Conjunctivae are normal.  Pulmonary/Chest: Effort normal and breath sounds normal.  Lymphadenopathy:    He has no cervical adenopathy.          Assessment & Plan:  Use Delsym prn

## 2014-07-10 DIAGNOSIS — M546 Pain in thoracic spine: Secondary | ICD-10-CM | POA: Diagnosis not present

## 2014-07-10 DIAGNOSIS — M545 Low back pain: Secondary | ICD-10-CM | POA: Diagnosis not present

## 2014-07-10 DIAGNOSIS — M6281 Muscle weakness (generalized): Secondary | ICD-10-CM | POA: Diagnosis not present

## 2014-07-10 DIAGNOSIS — R2681 Unsteadiness on feet: Secondary | ICD-10-CM | POA: Diagnosis not present

## 2014-07-12 DIAGNOSIS — M545 Low back pain: Secondary | ICD-10-CM | POA: Diagnosis not present

## 2014-07-12 DIAGNOSIS — M6281 Muscle weakness (generalized): Secondary | ICD-10-CM | POA: Diagnosis not present

## 2014-07-12 DIAGNOSIS — M546 Pain in thoracic spine: Secondary | ICD-10-CM | POA: Diagnosis not present

## 2014-07-12 DIAGNOSIS — R2681 Unsteadiness on feet: Secondary | ICD-10-CM | POA: Diagnosis not present

## 2014-07-15 DIAGNOSIS — M545 Low back pain: Secondary | ICD-10-CM | POA: Diagnosis not present

## 2014-07-15 DIAGNOSIS — R2681 Unsteadiness on feet: Secondary | ICD-10-CM | POA: Diagnosis not present

## 2014-07-15 DIAGNOSIS — M6281 Muscle weakness (generalized): Secondary | ICD-10-CM | POA: Diagnosis not present

## 2014-07-15 DIAGNOSIS — M546 Pain in thoracic spine: Secondary | ICD-10-CM | POA: Diagnosis not present

## 2014-07-17 DIAGNOSIS — R2681 Unsteadiness on feet: Secondary | ICD-10-CM | POA: Diagnosis not present

## 2014-07-17 DIAGNOSIS — M545 Low back pain: Secondary | ICD-10-CM | POA: Diagnosis not present

## 2014-07-17 DIAGNOSIS — M546 Pain in thoracic spine: Secondary | ICD-10-CM | POA: Diagnosis not present

## 2014-07-17 DIAGNOSIS — M6281 Muscle weakness (generalized): Secondary | ICD-10-CM | POA: Diagnosis not present

## 2014-07-19 DIAGNOSIS — M545 Low back pain: Secondary | ICD-10-CM | POA: Diagnosis not present

## 2014-07-19 DIAGNOSIS — M6281 Muscle weakness (generalized): Secondary | ICD-10-CM | POA: Diagnosis not present

## 2014-07-19 DIAGNOSIS — M546 Pain in thoracic spine: Secondary | ICD-10-CM | POA: Diagnosis not present

## 2014-07-19 DIAGNOSIS — R2681 Unsteadiness on feet: Secondary | ICD-10-CM | POA: Diagnosis not present

## 2014-07-22 DIAGNOSIS — M6281 Muscle weakness (generalized): Secondary | ICD-10-CM | POA: Diagnosis not present

## 2014-07-22 DIAGNOSIS — M546 Pain in thoracic spine: Secondary | ICD-10-CM | POA: Diagnosis not present

## 2014-07-22 DIAGNOSIS — M545 Low back pain: Secondary | ICD-10-CM | POA: Diagnosis not present

## 2014-07-22 DIAGNOSIS — R2681 Unsteadiness on feet: Secondary | ICD-10-CM | POA: Diagnosis not present

## 2014-07-24 DIAGNOSIS — R2681 Unsteadiness on feet: Secondary | ICD-10-CM | POA: Diagnosis not present

## 2014-07-24 DIAGNOSIS — M546 Pain in thoracic spine: Secondary | ICD-10-CM | POA: Diagnosis not present

## 2014-07-24 DIAGNOSIS — M6281 Muscle weakness (generalized): Secondary | ICD-10-CM | POA: Diagnosis not present

## 2014-07-24 DIAGNOSIS — M545 Low back pain: Secondary | ICD-10-CM | POA: Diagnosis not present

## 2014-07-26 DIAGNOSIS — M6281 Muscle weakness (generalized): Secondary | ICD-10-CM | POA: Diagnosis not present

## 2014-07-29 DIAGNOSIS — M6281 Muscle weakness (generalized): Secondary | ICD-10-CM | POA: Diagnosis not present

## 2014-08-02 DIAGNOSIS — M6281 Muscle weakness (generalized): Secondary | ICD-10-CM | POA: Diagnosis not present

## 2014-08-05 DIAGNOSIS — M6281 Muscle weakness (generalized): Secondary | ICD-10-CM | POA: Diagnosis not present

## 2014-08-07 DIAGNOSIS — M6281 Muscle weakness (generalized): Secondary | ICD-10-CM | POA: Diagnosis not present

## 2014-08-09 DIAGNOSIS — M6281 Muscle weakness (generalized): Secondary | ICD-10-CM | POA: Diagnosis not present

## 2014-08-12 DIAGNOSIS — M6281 Muscle weakness (generalized): Secondary | ICD-10-CM | POA: Diagnosis not present

## 2014-08-14 DIAGNOSIS — M6281 Muscle weakness (generalized): Secondary | ICD-10-CM | POA: Diagnosis not present

## 2014-08-16 DIAGNOSIS — M6281 Muscle weakness (generalized): Secondary | ICD-10-CM | POA: Diagnosis not present

## 2014-08-19 DIAGNOSIS — M6281 Muscle weakness (generalized): Secondary | ICD-10-CM | POA: Diagnosis not present

## 2014-08-21 DIAGNOSIS — M6281 Muscle weakness (generalized): Secondary | ICD-10-CM | POA: Diagnosis not present

## 2014-08-26 DIAGNOSIS — R296 Repeated falls: Secondary | ICD-10-CM | POA: Diagnosis not present

## 2014-08-26 DIAGNOSIS — M6281 Muscle weakness (generalized): Secondary | ICD-10-CM | POA: Diagnosis not present

## 2014-08-26 DIAGNOSIS — R2681 Unsteadiness on feet: Secondary | ICD-10-CM | POA: Diagnosis not present

## 2014-08-28 DIAGNOSIS — R2681 Unsteadiness on feet: Secondary | ICD-10-CM | POA: Diagnosis not present

## 2014-08-28 DIAGNOSIS — M6281 Muscle weakness (generalized): Secondary | ICD-10-CM | POA: Diagnosis not present

## 2014-08-28 DIAGNOSIS — R296 Repeated falls: Secondary | ICD-10-CM | POA: Diagnosis not present

## 2014-08-29 IMAGING — CR DG TIBIA/FIBULA 2V*L*
2 series · 2 of 2 positions shown · non-contrast
Comparison: Preliminary reading Dr. Sakhu

CLINICAL DATA: Leg pain post fall

LEFT TIBIA AND FIBULA - 2 VIEW

[AP]
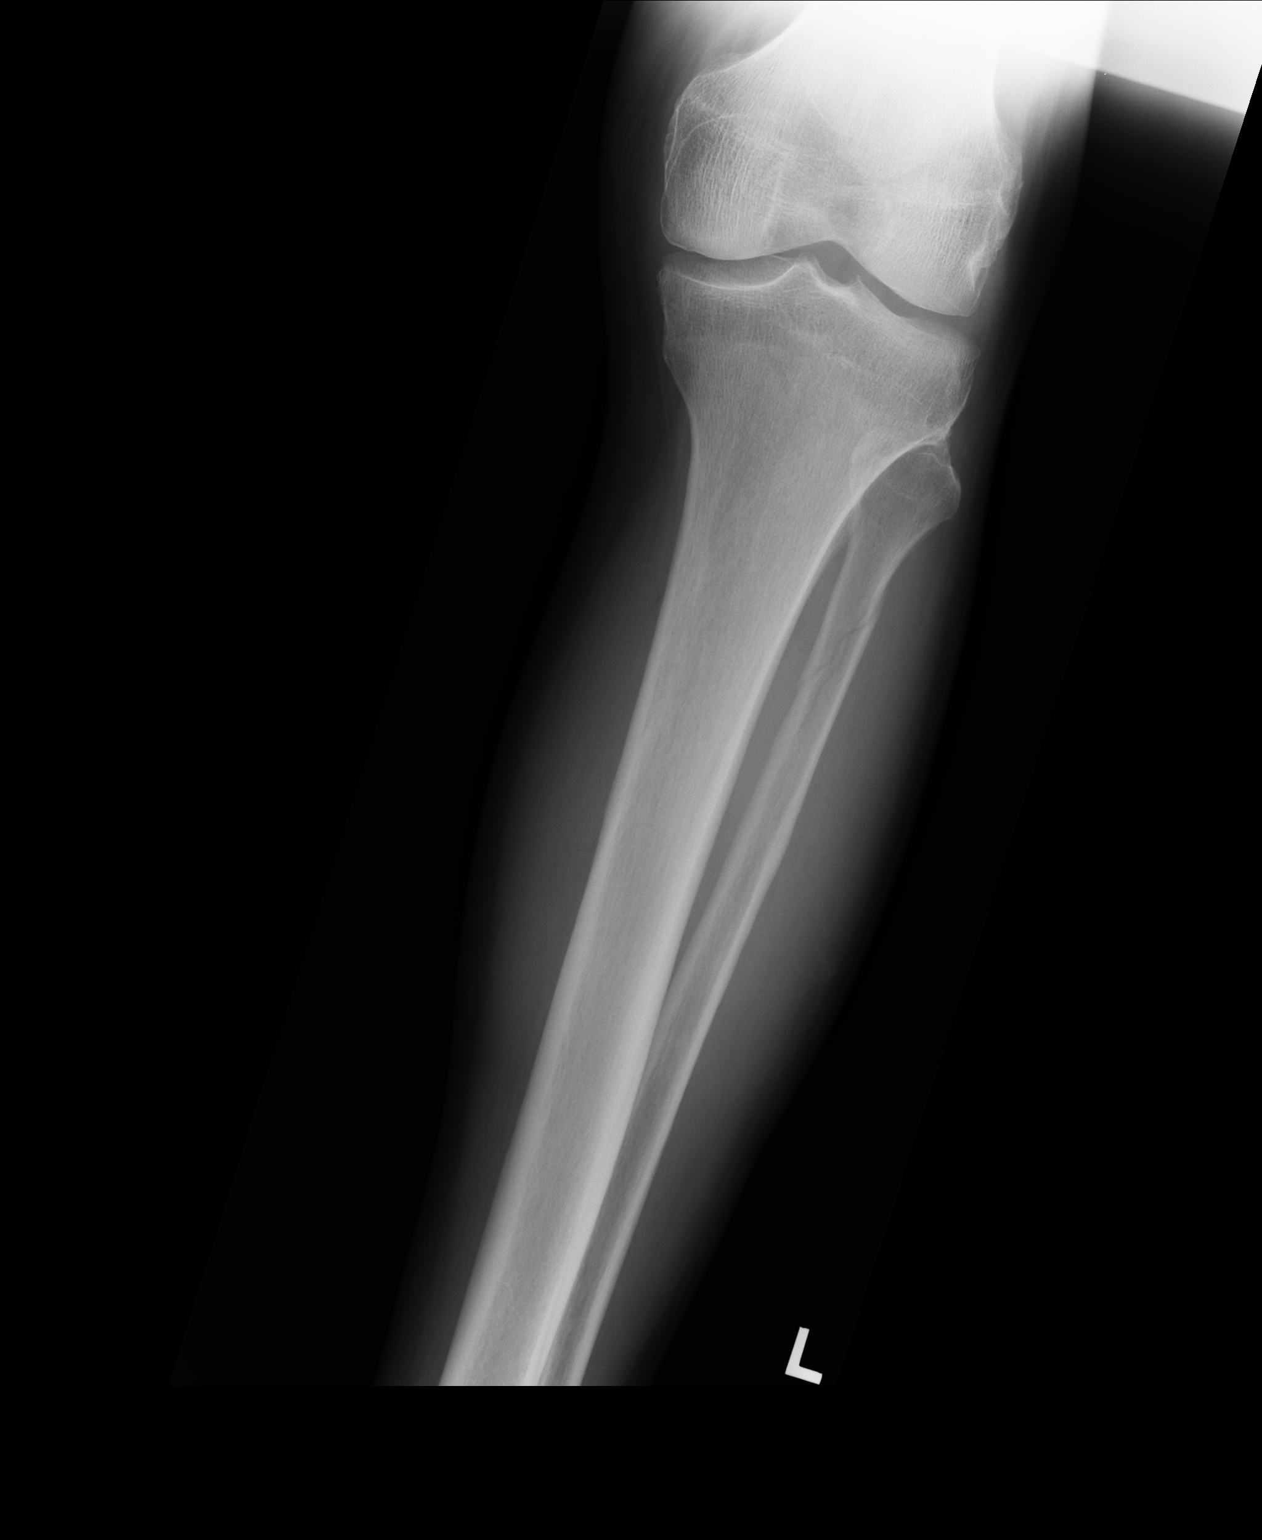

[lateral]
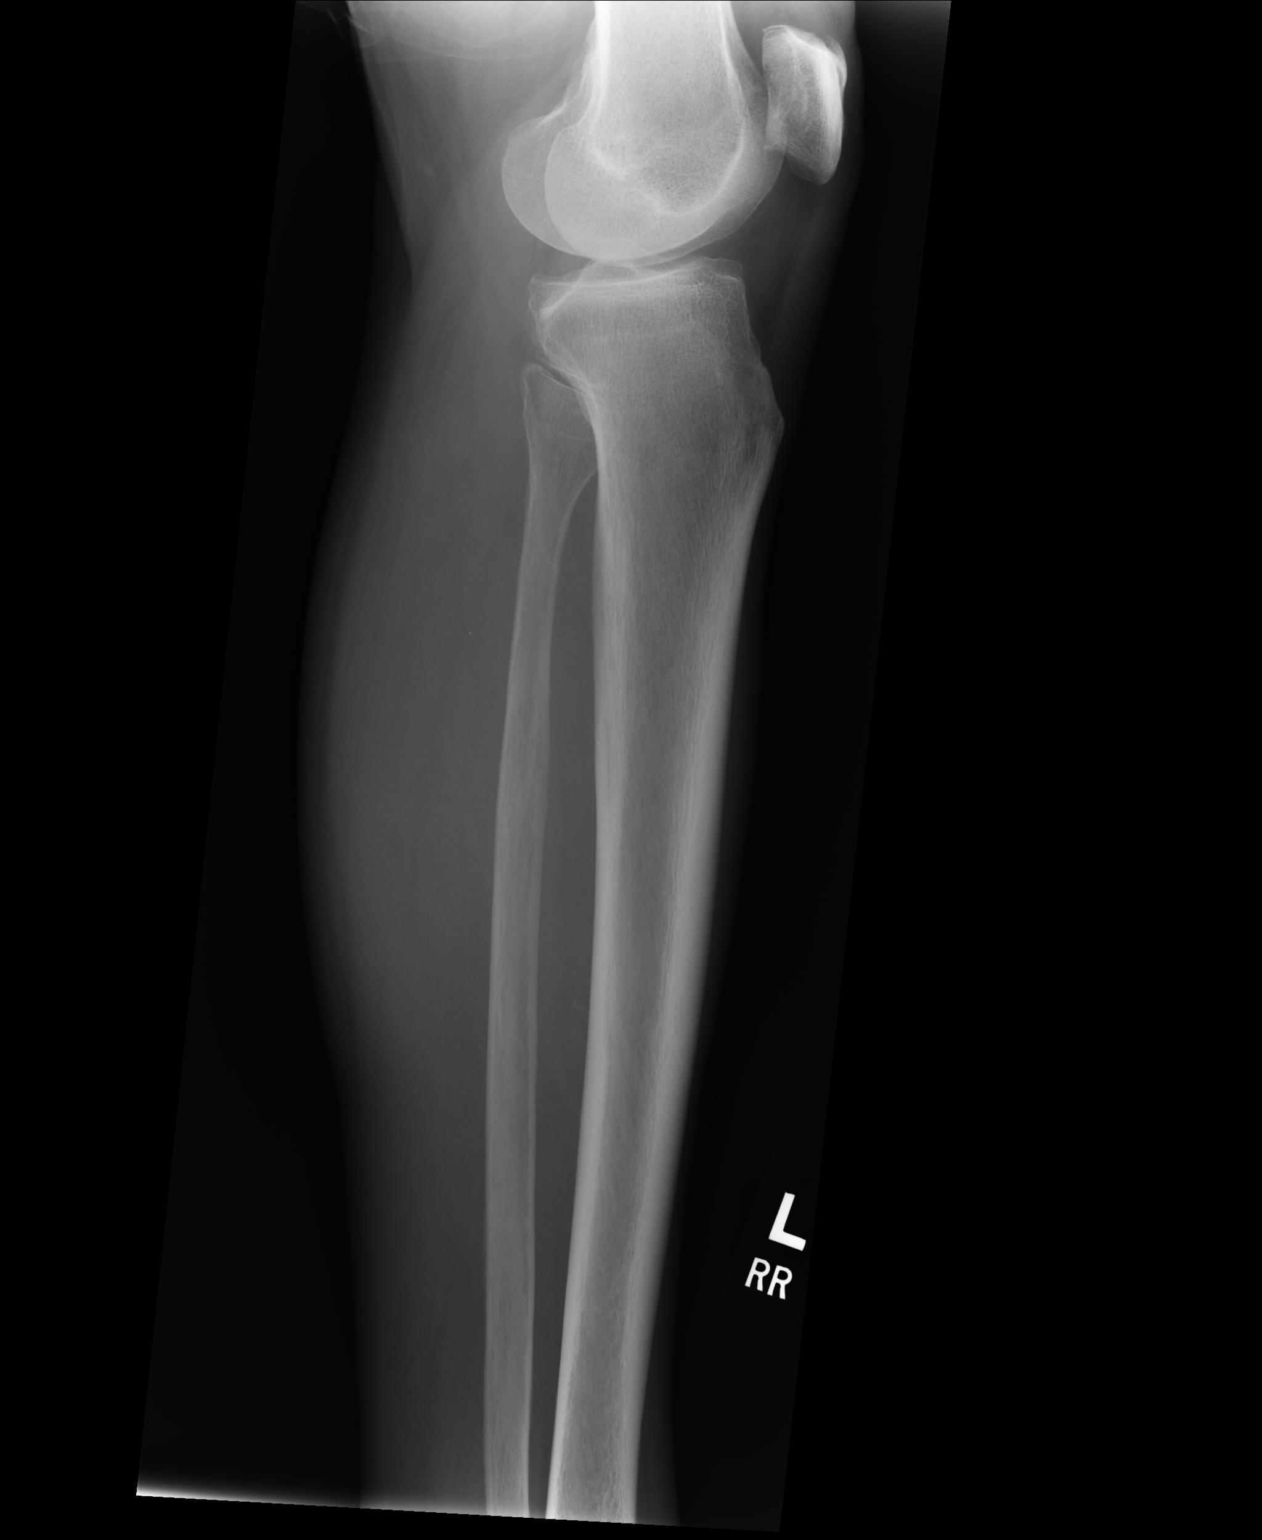

[2 of 2 positions shown; findings below may reference images not displayed]

FINDINGS: Two views of the left tibia-fibula submitted.  There is
nondisplaced fracture in the proximal shaft of the left fibula.
IMPRESSION: Nondisplaced fracture proximal shaft of the left fibula

## 2014-08-30 DIAGNOSIS — M6281 Muscle weakness (generalized): Secondary | ICD-10-CM | POA: Diagnosis not present

## 2014-08-30 DIAGNOSIS — R296 Repeated falls: Secondary | ICD-10-CM | POA: Diagnosis not present

## 2014-08-30 DIAGNOSIS — R2681 Unsteadiness on feet: Secondary | ICD-10-CM | POA: Diagnosis not present

## 2014-09-02 DIAGNOSIS — M6281 Muscle weakness (generalized): Secondary | ICD-10-CM | POA: Diagnosis not present

## 2014-09-02 DIAGNOSIS — R296 Repeated falls: Secondary | ICD-10-CM | POA: Diagnosis not present

## 2014-09-02 DIAGNOSIS — R2681 Unsteadiness on feet: Secondary | ICD-10-CM | POA: Diagnosis not present

## 2014-09-04 DIAGNOSIS — R2681 Unsteadiness on feet: Secondary | ICD-10-CM | POA: Diagnosis not present

## 2014-09-04 DIAGNOSIS — R296 Repeated falls: Secondary | ICD-10-CM | POA: Diagnosis not present

## 2014-09-04 DIAGNOSIS — M6281 Muscle weakness (generalized): Secondary | ICD-10-CM | POA: Diagnosis not present

## 2014-09-06 DIAGNOSIS — R2681 Unsteadiness on feet: Secondary | ICD-10-CM | POA: Diagnosis not present

## 2014-09-06 DIAGNOSIS — R296 Repeated falls: Secondary | ICD-10-CM | POA: Diagnosis not present

## 2014-09-06 DIAGNOSIS — M6281 Muscle weakness (generalized): Secondary | ICD-10-CM | POA: Diagnosis not present

## 2014-09-09 DIAGNOSIS — R296 Repeated falls: Secondary | ICD-10-CM | POA: Diagnosis not present

## 2014-09-09 DIAGNOSIS — M6281 Muscle weakness (generalized): Secondary | ICD-10-CM | POA: Diagnosis not present

## 2014-09-09 DIAGNOSIS — R2681 Unsteadiness on feet: Secondary | ICD-10-CM | POA: Diagnosis not present

## 2014-09-11 DIAGNOSIS — R2681 Unsteadiness on feet: Secondary | ICD-10-CM | POA: Diagnosis not present

## 2014-09-11 DIAGNOSIS — M6281 Muscle weakness (generalized): Secondary | ICD-10-CM | POA: Diagnosis not present

## 2014-09-11 DIAGNOSIS — R296 Repeated falls: Secondary | ICD-10-CM | POA: Diagnosis not present

## 2014-09-13 DIAGNOSIS — R2681 Unsteadiness on feet: Secondary | ICD-10-CM | POA: Diagnosis not present

## 2014-09-13 DIAGNOSIS — R296 Repeated falls: Secondary | ICD-10-CM | POA: Diagnosis not present

## 2014-09-13 DIAGNOSIS — M6281 Muscle weakness (generalized): Secondary | ICD-10-CM | POA: Diagnosis not present

## 2014-09-14 ENCOUNTER — Other Ambulatory Visit: Payer: Self-pay | Admitting: Family Medicine

## 2014-09-16 DIAGNOSIS — R296 Repeated falls: Secondary | ICD-10-CM | POA: Diagnosis not present

## 2014-09-16 DIAGNOSIS — R2681 Unsteadiness on feet: Secondary | ICD-10-CM | POA: Diagnosis not present

## 2014-09-16 DIAGNOSIS — M6281 Muscle weakness (generalized): Secondary | ICD-10-CM | POA: Diagnosis not present

## 2014-09-16 NOTE — Telephone Encounter (Signed)
Sent to the pharmacy by e-scribe for 90 days.  Pt has upcoming appt on 09/20/14 for CPX.

## 2014-09-19 DIAGNOSIS — M6281 Muscle weakness (generalized): Secondary | ICD-10-CM | POA: Diagnosis not present

## 2014-09-19 DIAGNOSIS — R296 Repeated falls: Secondary | ICD-10-CM | POA: Diagnosis not present

## 2014-09-19 DIAGNOSIS — R2681 Unsteadiness on feet: Secondary | ICD-10-CM | POA: Diagnosis not present

## 2014-09-20 ENCOUNTER — Encounter: Payer: Self-pay | Admitting: Family Medicine

## 2014-09-20 ENCOUNTER — Ambulatory Visit (INDEPENDENT_AMBULATORY_CARE_PROVIDER_SITE_OTHER): Payer: Medicare Other | Admitting: Family Medicine

## 2014-09-20 VITALS — BP 139/78 | HR 54 | Temp 98.4°F | Ht 65.5 in | Wt 163.0 lb

## 2014-09-20 DIAGNOSIS — I10 Essential (primary) hypertension: Secondary | ICD-10-CM

## 2014-09-20 DIAGNOSIS — J3089 Other allergic rhinitis: Secondary | ICD-10-CM

## 2014-09-20 DIAGNOSIS — N138 Other obstructive and reflux uropathy: Secondary | ICD-10-CM

## 2014-09-20 DIAGNOSIS — N401 Enlarged prostate with lower urinary tract symptoms: Secondary | ICD-10-CM

## 2014-09-20 DIAGNOSIS — Z125 Encounter for screening for malignant neoplasm of prostate: Secondary | ICD-10-CM

## 2014-09-20 LAB — CBC WITH DIFFERENTIAL/PLATELET
Basophils Absolute: 0.1 10*3/uL (ref 0.0–0.1)
Basophils Relative: 1.2 % (ref 0.0–3.0)
EOS PCT: 5.7 % — AB (ref 0.0–5.0)
Eosinophils Absolute: 0.5 10*3/uL (ref 0.0–0.7)
HEMATOCRIT: 47.8 % (ref 39.0–52.0)
Hemoglobin: 16.1 g/dL (ref 13.0–17.0)
Lymphocytes Relative: 30.9 % (ref 12.0–46.0)
Lymphs Abs: 2.7 10*3/uL (ref 0.7–4.0)
MCHC: 33.6 g/dL (ref 30.0–36.0)
MCV: 92.6 fl (ref 78.0–100.0)
Monocytes Absolute: 0.8 10*3/uL (ref 0.1–1.0)
Monocytes Relative: 9.7 % (ref 3.0–12.0)
NEUTROS ABS: 4.5 10*3/uL (ref 1.4–7.7)
Neutrophils Relative %: 52.5 % (ref 43.0–77.0)
PLATELETS: 327 10*3/uL (ref 150.0–400.0)
RBC: 5.16 Mil/uL (ref 4.22–5.81)
RDW: 13.1 % (ref 11.5–15.5)
WBC: 8.6 10*3/uL (ref 4.0–10.5)

## 2014-09-20 LAB — BASIC METABOLIC PANEL
BUN: 17 mg/dL (ref 6–23)
CALCIUM: 9.7 mg/dL (ref 8.4–10.5)
CHLORIDE: 102 meq/L (ref 96–112)
CO2: 31 mEq/L (ref 19–32)
CREATININE: 0.84 mg/dL (ref 0.40–1.50)
GFR: 91.66 mL/min (ref >60.00)
Glucose, Bld: 95 mg/dL (ref 70–99)
Potassium: 4.8 mEq/L (ref 3.5–5.1)
Sodium: 139 mEq/L (ref 135–145)

## 2014-09-20 LAB — HEPATIC FUNCTION PANEL
ALT: 38 U/L (ref 0–53)
AST: 37 U/L (ref 0–37)
Albumin: 4.2 g/dL (ref 3.5–5.2)
Alkaline Phosphatase: 80 U/L (ref 39–117)
Bilirubin, Direct: 0.3 mg/dL (ref 0.0–0.3)
Total Bilirubin: 1.5 mg/dL — ABNORMAL HIGH (ref 0.2–1.2)
Total Protein: 6.9 g/dL (ref 6.0–8.3)

## 2014-09-20 LAB — LIPID PANEL
Cholesterol: 142 mg/dL (ref 0–200)
HDL: 34 mg/dL — AB (ref >39.00)
LDL CALC: 77 mg/dL (ref 0–99)
NONHDL: 108
Total CHOL/HDL Ratio: 4
Triglycerides: 155 mg/dL — ABNORMAL HIGH (ref 0.0–149.0)
VLDL: 31 mg/dL (ref 0.0–40.0)

## 2014-09-20 LAB — POCT URINALYSIS DIPSTICK
BILIRUBIN UA: NEGATIVE
Blood, UA: NEGATIVE
GLUCOSE UA: NEGATIVE
Ketones, UA: NEGATIVE
LEUKOCYTES UA: NEGATIVE
NITRITE UA: NEGATIVE
Spec Grav, UA: 1.015
UROBILINOGEN UA: 0.2
pH, UA: 7

## 2014-09-20 LAB — TSH: TSH: 2.45 u[IU]/mL (ref 0.35–4.50)

## 2014-09-20 LAB — PSA: PSA: 2.25 ng/mL (ref 0.10–4.00)

## 2014-09-20 MED ORDER — PANTOPRAZOLE SODIUM 40 MG PO TBEC: 40.0000 mg | DELAYED_RELEASE_TABLET | Freq: Every day | ORAL | Status: DC

## 2014-09-20 MED ORDER — HYDROCHLOROTHIAZIDE 25 MG PO TABS: 25.0000 mg | ORAL_TABLET | Freq: Every morning | ORAL | Status: DC

## 2014-09-20 NOTE — Progress Notes (Signed)
Pre visit review using our clinic review tool, if applicable. No additional management support is needed unless otherwise documented below in the visit note. 

## 2014-09-20 NOTE — Progress Notes (Signed)
   Subjective:    Patient ID: Gabriel [name], male    DOB: 1924-04-30, 81 y.o.   MRN: [id number]  HPI 81 yr old male for a follow on HTN, GERD, allergies, and other issues. He feels well and has no concerns. His BP is stable. His GERD is controlled.    Review of Systems  Constitutional: Negative.   HENT: Negative.   Eyes: Negative.   Respiratory: Negative.   Cardiovascular: Negative.   Gastrointestinal: Negative.   Genitourinary: Negative.   Musculoskeletal: Negative.   Skin: Negative.   Neurological: Negative.   Psychiatric/Behavioral: Negative.        Objective:   Physical Exam  Constitutional: He is oriented to person, place, and time. He appears well-developed and well-nourished. No distress.  HENT:  Head: Normocephalic and atraumatic.  Right Ear: External ear normal.  Left Ear: External ear normal.  Nose: Nose normal.  Mouth/Throat: Oropharynx is clear and moist. No oropharyngeal exudate.  Eyes: Conjunctivae and EOM are normal. Pupils are equal, round, and reactive to light. Right eye exhibits no discharge. Left eye exhibits no discharge. No scleral icterus.  Neck: Neck supple. No JVD present. No tracheal deviation present. No thyromegaly present.  Cardiovascular: Normal rate, regular rhythm, normal heart sounds and intact distal pulses.  Exam reveals no gallop and no friction rub.   No murmur heard. EKG is normal with an occasional PAC  Pulmonary/Chest: Effort normal and breath sounds normal. No respiratory distress. He has no wheezes. He has no rales. He exhibits no tenderness.  Abdominal: Soft. Bowel sounds are normal. He exhibits no distension and no mass. There is no tenderness. There is no rebound and no guarding.  Genitourinary: Rectum normal, prostate normal and penis normal. Guaiac negative stool. No penile tenderness.  Musculoskeletal: Normal range of motion. He exhibits no edema or tenderness.  Lymphadenopathy:    He has no cervical adenopathy.    Neurological: He is alert and oriented to person, place, and time. He has normal reflexes. No cranial nerve deficit. He exhibits normal muscle tone. Coordination normal.  Skin: Skin is warm and dry. No rash noted. He is not diaphoretic. No erythema. No pallor.  Psychiatric: He has a normal mood and affect. His behavior is normal. Judgment and thought content normal.          Assessment & Plan:  His HTN and GERD are stable. meds were refilled. We will get fasting labs today. I reminded him to exercise daily.

## 2014-09-24 ENCOUNTER — Encounter: Payer: Medicare Other | Admitting: Family Medicine

## 2014-09-24 DIAGNOSIS — R296 Repeated falls: Secondary | ICD-10-CM | POA: Diagnosis not present

## 2014-09-24 DIAGNOSIS — R2681 Unsteadiness on feet: Secondary | ICD-10-CM | POA: Diagnosis not present

## 2014-09-24 DIAGNOSIS — M6281 Muscle weakness (generalized): Secondary | ICD-10-CM | POA: Diagnosis not present

## 2014-09-25 DIAGNOSIS — M6281 Muscle weakness (generalized): Secondary | ICD-10-CM | POA: Diagnosis not present

## 2014-09-25 DIAGNOSIS — R2681 Unsteadiness on feet: Secondary | ICD-10-CM | POA: Diagnosis not present

## 2014-09-25 DIAGNOSIS — R296 Repeated falls: Secondary | ICD-10-CM | POA: Diagnosis not present

## 2014-09-27 DIAGNOSIS — M6281 Muscle weakness (generalized): Secondary | ICD-10-CM | POA: Diagnosis not present

## 2014-09-27 DIAGNOSIS — R296 Repeated falls: Secondary | ICD-10-CM | POA: Diagnosis not present

## 2014-09-27 DIAGNOSIS — R2681 Unsteadiness on feet: Secondary | ICD-10-CM | POA: Diagnosis not present

## 2014-09-30 DIAGNOSIS — R296 Repeated falls: Secondary | ICD-10-CM | POA: Diagnosis not present

## 2014-09-30 DIAGNOSIS — R2681 Unsteadiness on feet: Secondary | ICD-10-CM | POA: Diagnosis not present

## 2014-09-30 DIAGNOSIS — M6281 Muscle weakness (generalized): Secondary | ICD-10-CM | POA: Diagnosis not present

## 2014-10-31 ENCOUNTER — Ambulatory Visit (INDEPENDENT_AMBULATORY_CARE_PROVIDER_SITE_OTHER): Payer: Medicare Other | Admitting: Family Medicine

## 2014-10-31 ENCOUNTER — Encounter: Payer: Self-pay | Admitting: Family Medicine

## 2014-10-31 VITALS — BP 149/76 | HR 70 | Temp 98.1°F | Ht 65.5 in | Wt 166.0 lb

## 2014-10-31 DIAGNOSIS — K5732 Diverticulitis of large intestine without perforation or abscess without bleeding: Secondary | ICD-10-CM

## 2014-10-31 MED ORDER — CIPROFLOXACIN HCL 500 MG PO TABS: 500.0000 mg | ORAL_TABLET | Freq: Two times a day (BID) | ORAL | Status: DC

## 2014-10-31 MED ORDER — METRONIDAZOLE 500 MG PO TABS: 500.0000 mg | ORAL_TABLET | Freq: Two times a day (BID) | ORAL | Status: DC

## 2014-10-31 NOTE — Progress Notes (Signed)
Pre visit review using our clinic review tool, if applicable. No additional management support is needed unless otherwise documented below in the visit note. 

## 2014-10-31 NOTE — Progress Notes (Signed)
   Subjective:    Patient ID: Gabriel [name], male    DOB: 08/16/1924, 81 y.o.   MRN: [id number]  HPI Here for the onset last night of sharp pains in the LLQ. He had a normal BM this morning and this eased the pain a little. No urinary sx. No nausea or fever. His last bout of diverticulitis was 4 years ago. He has not tried anything for this.    Review of Systems  Constitutional: Negative.   Respiratory: Negative.   Cardiovascular: Negative.   Gastrointestinal: Positive for abdominal pain. Negative for nausea, vomiting, diarrhea, constipation, blood in stool, abdominal distention, anal bleeding and rectal pain.  Genitourinary: Negative.        Objective:   Physical Exam  Constitutional: He appears well-developed and well-nourished. No distress.  Neck: No thyromegaly present.  Cardiovascular: Normal rate, regular rhythm, normal heart sounds and intact distal pulses.   Pulmonary/Chest: Effort normal and breath sounds normal.  Abdominal: Soft. Bowel sounds are normal. He exhibits no distension and no mass. There is tenderness. There is guarding. There is no rebound.  Mildly tender in the LLQ   Lymphadenopathy:    He has no cervical adenopathy.          Assessment & Plan:  Early diverticulitis. Treat with Cipro and Flagyl.

## 2014-12-31 DIAGNOSIS — Z23 Encounter for immunization: Secondary | ICD-10-CM | POA: Diagnosis not present

## 2015-01-02 DIAGNOSIS — H52223 Regular astigmatism, bilateral: Secondary | ICD-10-CM | POA: Diagnosis not present

## 2015-01-02 DIAGNOSIS — H3531 Nonexudative age-related macular degeneration: Secondary | ICD-10-CM | POA: Diagnosis not present

## 2015-01-02 DIAGNOSIS — D3131 Benign neoplasm of right choroid: Secondary | ICD-10-CM | POA: Diagnosis not present

## 2015-01-02 DIAGNOSIS — H5203 Hypermetropia, bilateral: Secondary | ICD-10-CM | POA: Diagnosis not present

## 2015-01-02 DIAGNOSIS — H524 Presbyopia: Secondary | ICD-10-CM | POA: Diagnosis not present

## 2015-01-02 DIAGNOSIS — H25812 Combined forms of age-related cataract, left eye: Secondary | ICD-10-CM | POA: Diagnosis not present

## 2015-01-07 DIAGNOSIS — D225 Melanocytic nevi of trunk: Secondary | ICD-10-CM | POA: Diagnosis not present

## 2015-01-07 DIAGNOSIS — X32XXXD Exposure to sunlight, subsequent encounter: Secondary | ICD-10-CM | POA: Diagnosis not present

## 2015-01-07 DIAGNOSIS — C44519 Basal cell carcinoma of skin of other part of trunk: Secondary | ICD-10-CM | POA: Diagnosis not present

## 2015-01-07 DIAGNOSIS — L570 Actinic keratosis: Secondary | ICD-10-CM | POA: Diagnosis not present

## 2015-01-13 DIAGNOSIS — H1131 Conjunctival hemorrhage, right eye: Secondary | ICD-10-CM | POA: Diagnosis not present

## 2015-01-24 DIAGNOSIS — H1131 Conjunctival hemorrhage, right eye: Secondary | ICD-10-CM | POA: Diagnosis not present

## 2015-02-12 DIAGNOSIS — H353132 Nonexudative age-related macular degeneration, bilateral, intermediate dry stage: Secondary | ICD-10-CM | POA: Diagnosis not present

## 2015-02-12 DIAGNOSIS — D3131 Benign neoplasm of right choroid: Secondary | ICD-10-CM | POA: Diagnosis not present

## 2015-02-12 DIAGNOSIS — H43813 Vitreous degeneration, bilateral: Secondary | ICD-10-CM | POA: Diagnosis not present

## 2015-02-25 DIAGNOSIS — C44219 Basal cell carcinoma of skin of left ear and external auricular canal: Secondary | ICD-10-CM | POA: Diagnosis not present

## 2015-02-25 DIAGNOSIS — Z08 Encounter for follow-up examination after completed treatment for malignant neoplasm: Secondary | ICD-10-CM | POA: Diagnosis not present

## 2015-02-25 DIAGNOSIS — C44310 Basal cell carcinoma of skin of unspecified parts of face: Secondary | ICD-10-CM | POA: Diagnosis not present

## 2015-02-25 DIAGNOSIS — X32XXXD Exposure to sunlight, subsequent encounter: Secondary | ICD-10-CM | POA: Diagnosis not present

## 2015-02-25 DIAGNOSIS — C44319 Basal cell carcinoma of skin of other parts of face: Secondary | ICD-10-CM | POA: Diagnosis not present

## 2015-02-25 DIAGNOSIS — L570 Actinic keratosis: Secondary | ICD-10-CM | POA: Diagnosis not present

## 2015-02-25 DIAGNOSIS — Z85828 Personal history of other malignant neoplasm of skin: Secondary | ICD-10-CM | POA: Diagnosis not present

## 2015-03-25 DIAGNOSIS — M1612 Unilateral primary osteoarthritis, left hip: Secondary | ICD-10-CM | POA: Diagnosis not present

## 2015-04-01 DIAGNOSIS — Z85828 Personal history of other malignant neoplasm of skin: Secondary | ICD-10-CM | POA: Diagnosis not present

## 2015-04-01 DIAGNOSIS — Z08 Encounter for follow-up examination after completed treatment for malignant neoplasm: Secondary | ICD-10-CM | POA: Diagnosis not present

## 2015-07-21 ENCOUNTER — Ambulatory Visit: Payer: PRIVATE HEALTH INSURANCE | Admitting: Family Medicine

## 2015-07-29 ENCOUNTER — Encounter: Payer: Self-pay | Admitting: Family Medicine

## 2015-07-29 ENCOUNTER — Ambulatory Visit (INDEPENDENT_AMBULATORY_CARE_PROVIDER_SITE_OTHER): Payer: Medicare Other | Admitting: Family Medicine

## 2015-07-29 VITALS — BP 139/68 | HR 96 | Temp 101.9°F | Ht 65.5 in | Wt 164.0 lb

## 2015-07-29 DIAGNOSIS — N390 Urinary tract infection, site not specified: Secondary | ICD-10-CM | POA: Diagnosis not present

## 2015-07-29 LAB — POC URINALSYSI DIPSTICK (AUTOMATED)
Bilirubin, UA: NEGATIVE
Glucose, UA: NEGATIVE
KETONES UA: NEGATIVE
PH UA: 8
Spec Grav, UA: 1.01
UROBILINOGEN UA: 0.2

## 2015-07-29 MED ORDER — CIPROFLOXACIN HCL 500 MG PO TABS
500.0000 mg | ORAL_TABLET | Freq: Two times a day (BID) | ORAL | Status: DC
Start: 2015-07-29 — End: 2015-10-17

## 2015-07-29 NOTE — Progress Notes (Signed)
Pre visit review using our clinic review tool, if applicable. No additional management support is needed unless otherwise documented below in the visit note. 

## 2015-07-29 NOTE — Progress Notes (Signed)
   Subjective:    Patient ID: Gabriel [name], male    DOB: [birth year], 82 y.o.   MRN: [id number]  HPI Here for 2 days of urinary urgency and burning with some fever. No nausea or back pain.    Review of Systems  Constitutional: Negative.   Respiratory: Negative.   Cardiovascular: Negative.   Gastrointestinal: Negative.   Genitourinary: Positive for dysuria, urgency and frequency. Negative for hematuria.       Objective:   Physical Exam  Constitutional: He appears well-developed and well-nourished.  Cardiovascular: Normal rate, regular rhythm, normal heart sounds and intact distal pulses.   Pulmonary/Chest: Effort normal and breath sounds normal.  Abdominal: Soft. Bowel sounds are normal. He exhibits no distension and no mass. There is no tenderness. There is no rebound and no guarding.          Assessment & Plan:  UTI, treat with Cipro. Culture the sample. He will drink plenty of water.

## 2015-07-29 NOTE — Addendum Note (Signed)
Addended by: Aggie Hacker A on: 07/29/2015 10:41 AM   Modules accepted: Orders

## 2015-07-31 ENCOUNTER — Telehealth: Payer: Self-pay | Admitting: Family Medicine

## 2015-07-31 NOTE — Telephone Encounter (Signed)
Pt states his UTI/kidneys are not much better. Pt feels weak and "washed out"  Pt only been on med for 3 days, but would like to know if this is normal. Pt would als like to know if urine culture is back and the results.   [place] [employer]

## 2015-07-31 NOTE — Telephone Encounter (Signed)
Per Dr. Sarajane Jews, pt should continue with antibiotic, we are waiting on culture results. I spoke with pt and will call back with results, hopefully by Friday 08/01/2015.

## 2015-08-01 LAB — URINE CULTURE: Colony Count: >=100000

## 2015-08-01 MED ORDER — SULFAMETHOXAZOLE-TRIMETHOPRIM 800-160 MG PO TABS: 1.0000 | ORAL_TABLET | Freq: Two times a day (BID) | ORAL | Status: DC

## 2015-08-01 NOTE — Addendum Note (Signed)
Addended by: Aggie Hacker A on: 08/01/2015 11:42 AM   Modules accepted: Orders

## 2015-08-01 NOTE — Telephone Encounter (Signed)
I spoke with pt and went over results from culture.

## 2015-08-13 DIAGNOSIS — H43813 Vitreous degeneration, bilateral: Secondary | ICD-10-CM | POA: Diagnosis not present

## 2015-08-13 DIAGNOSIS — D3131 Benign neoplasm of right choroid: Secondary | ICD-10-CM | POA: Diagnosis not present

## 2015-08-13 DIAGNOSIS — H353132 Nonexudative age-related macular degeneration, bilateral, intermediate dry stage: Secondary | ICD-10-CM | POA: Diagnosis not present

## 2015-09-23 ENCOUNTER — Encounter: Payer: Self-pay | Admitting: Family Medicine

## 2015-09-23 ENCOUNTER — Ambulatory Visit (INDEPENDENT_AMBULATORY_CARE_PROVIDER_SITE_OTHER): Payer: Medicare Other | Admitting: Family Medicine

## 2015-09-23 VITALS — BP 156/68 | HR 67 | Temp 98.6°F | Ht 65.5 in | Wt 161.0 lb

## 2015-09-23 DIAGNOSIS — Z23 Encounter for immunization: Secondary | ICD-10-CM

## 2015-09-23 DIAGNOSIS — N401 Enlarged prostate with lower urinary tract symptoms: Secondary | ICD-10-CM | POA: Diagnosis not present

## 2015-09-23 DIAGNOSIS — I10 Essential (primary) hypertension: Secondary | ICD-10-CM

## 2015-09-23 DIAGNOSIS — K219 Gastro-esophageal reflux disease without esophagitis: Secondary | ICD-10-CM | POA: Diagnosis not present

## 2015-09-23 DIAGNOSIS — N138 Other obstructive and reflux uropathy: Secondary | ICD-10-CM | POA: Insufficient documentation

## 2015-09-23 DIAGNOSIS — R001 Bradycardia, unspecified: Secondary | ICD-10-CM

## 2015-09-23 LAB — POC URINALSYSI DIPSTICK (AUTOMATED)
BILIRUBIN UA: NEGATIVE
Blood, UA: NEGATIVE
Glucose, UA: NEGATIVE
KETONES UA: NEGATIVE
LEUKOCYTES UA: NEGATIVE
Nitrite, UA: NEGATIVE
PH UA: 7
Spec Grav, UA: 1.02
Urobilinogen, UA: 1

## 2015-09-23 LAB — HEPATIC FUNCTION PANEL
ALBUMIN: 4.2 g/dL (ref 3.5–5.2)
ALT: 35 U/L (ref 0–53)
AST: 33 U/L (ref 0–37)
Alkaline Phosphatase: 71 U/L (ref 39–117)
BILIRUBIN TOTAL: 1.3 mg/dL — AB (ref 0.2–1.2)
Bilirubin, Direct: 0.2 mg/dL (ref 0.0–0.3)
Total Protein: 6.6 g/dL (ref 6.0–8.3)

## 2015-09-23 LAB — LIPID PANEL
CHOLESTEROL: 144 mg/dL (ref 0–200)
HDL: 31.2 mg/dL — ABNORMAL LOW (ref >39.00)
LDL CALC: 74 mg/dL (ref 0–99)
NonHDL: 112.77
TRIGLYCERIDES: 195 mg/dL — AB (ref 0.0–149.0)
Total CHOL/HDL Ratio: 5
VLDL: 39 mg/dL (ref 0.0–40.0)

## 2015-09-23 LAB — CBC WITH DIFFERENTIAL/PLATELET
BASOS ABS: 0.1 10*3/uL (ref 0.0–0.1)
BASOS PCT: 1.1 % (ref 0.0–3.0)
Eosinophils Absolute: 0.3 10*3/uL (ref 0.0–0.7)
Eosinophils Relative: 2.7 % (ref 0.0–5.0)
HCT: 45.8 % (ref 39.0–52.0)
Hemoglobin: 15.4 g/dL (ref 13.0–17.0)
LYMPHS ABS: 2.9 10*3/uL (ref 0.7–4.0)
Lymphocytes Relative: 28.8 % (ref 12.0–46.0)
MCHC: 33.7 g/dL (ref 30.0–36.0)
MCV: 92.7 fl (ref 78.0–100.0)
MONOS PCT: 8.5 % (ref 3.0–12.0)
Monocytes Absolute: 0.9 10*3/uL (ref 0.1–1.0)
NEUTROS ABS: 5.9 10*3/uL (ref 1.4–7.7)
NEUTROS PCT: 58.9 % (ref 43.0–77.0)
PLATELETS: 282 10*3/uL (ref 150.0–400.0)
RBC: 4.94 Mil/uL (ref 4.22–5.81)
RDW: 14.4 % (ref 11.5–15.5)
WBC: 10.1 10*3/uL (ref 4.0–10.5)

## 2015-09-23 LAB — BASIC METABOLIC PANEL
BUN: 14 mg/dL (ref 6–23)
CHLORIDE: 102 meq/L (ref 96–112)
CO2: 29 meq/L (ref 19–32)
CREATININE: 0.72 mg/dL (ref 0.40–1.50)
Calcium: 9.4 mg/dL (ref 8.4–10.5)
GFR: 109.26 mL/min (ref >60.00)
Glucose, Bld: 100 mg/dL — ABNORMAL HIGH (ref 70–99)
POTASSIUM: 4.2 meq/L (ref 3.5–5.1)
SODIUM: 138 meq/L (ref 135–145)

## 2015-09-23 LAB — PSA: PSA: 2.85 ng/mL (ref 0.10–4.00)

## 2015-09-23 LAB — TSH: TSH: 3.25 u[IU]/mL (ref 0.35–4.50)

## 2015-09-23 MED ORDER — PANTOPRAZOLE SODIUM 40 MG PO TBEC: 40.0000 mg | DELAYED_RELEASE_TABLET | Freq: Every day | ORAL | Status: DC

## 2015-09-23 MED ORDER — HYDROCHLOROTHIAZIDE 25 MG PO TABS: 25.0000 mg | ORAL_TABLET | Freq: Every morning | ORAL | Status: DC

## 2015-09-23 NOTE — Progress Notes (Signed)
Pre visit review using our clinic review tool, if applicable. No additional management support is needed unless otherwise documented below in the visit note. 

## 2015-09-23 NOTE — Progress Notes (Signed)
   Subjective:    Patient ID: Gabriel [name], male    DOB: 21-Jan-1925, 82 y.o.   MRN: [id number]  HPI 82 yr old male to follow up. His BP and heart rate have been stable. He feels good. Appetite is good. He stays active and tries to go to 2-3 baseball games a month. He had a UTI in April which responded well to antibiotics. His GERD is controlled. He has regular BMs since he eats prunes every day.    Review of Systems  Constitutional: Negative.   HENT: Negative.   Eyes: Negative.   Respiratory: Negative.   Cardiovascular: Negative.   Gastrointestinal: Negative.   Genitourinary: Negative.   Musculoskeletal: Negative.   Skin: Negative.   Neurological: Negative.   Psychiatric/Behavioral: Negative.        Objective:   Physical Exam  Constitutional: He is oriented to person, place, and time. He appears well-developed and well-nourished. No distress.  HENT:  Head: Normocephalic and atraumatic.  Right Ear: External ear normal.  Left Ear: External ear normal.  Nose: Nose normal.  Mouth/Throat: Oropharynx is clear and moist. No oropharyngeal exudate.  Eyes: Conjunctivae and EOM are normal. Pupils are equal, round, and reactive to light. Right eye exhibits no discharge. Left eye exhibits no discharge. No scleral icterus.  Neck: Neck supple. No JVD present. No tracheal deviation present. No thyromegaly present.  Cardiovascular: Normal rate, regular rhythm, normal heart sounds and intact distal pulses.  Exam reveals no gallop and no friction rub.   No murmur heard. EKG shows stable sinus bradycardia  Pulmonary/Chest: Effort normal and breath sounds normal. No respiratory distress. He has no wheezes. He has no rales. He exhibits no tenderness.  Abdominal: Soft. Bowel sounds are normal. He exhibits no distension and no mass. There is no tenderness. There is no rebound and no guarding.  Genitourinary: Rectum normal, prostate normal and penis normal. Guaiac negative stool. No penile  tenderness.  Musculoskeletal: Normal range of motion. He exhibits no edema or tenderness.  Lymphadenopathy:    He has no cervical adenopathy.  Neurological: He is alert and oriented to person, place, and time. He has normal reflexes. No cranial nerve deficit. He exhibits normal muscle tone. Coordination normal.  Skin: Skin is warm and dry. No rash noted. He is not diaphoretic. No erythema. No pallor.  Psychiatric: He has a normal mood and affect. His behavior is normal. Judgment and thought content normal.          Assessment & Plan:  His BP and bradycardia are stable. GERD is well controlled. Constipation is controlled by eating prunes. His UTI has resolved. Get fasting labs today. He will arrange for a Medicare Wellness visit soon.  Laurey Morale, MD

## 2015-09-26 NOTE — Progress Notes (Signed)
LVM to return our call.

## 2015-10-01 DIAGNOSIS — Z08 Encounter for follow-up examination after completed treatment for malignant neoplasm: Secondary | ICD-10-CM | POA: Diagnosis not present

## 2015-10-01 DIAGNOSIS — D485 Neoplasm of uncertain behavior of skin: Secondary | ICD-10-CM | POA: Diagnosis not present

## 2015-10-01 DIAGNOSIS — Z85828 Personal history of other malignant neoplasm of skin: Secondary | ICD-10-CM | POA: Diagnosis not present

## 2015-10-01 DIAGNOSIS — L281 Prurigo nodularis: Secondary | ICD-10-CM | POA: Diagnosis not present

## 2015-10-01 DIAGNOSIS — X32XXXD Exposure to sunlight, subsequent encounter: Secondary | ICD-10-CM | POA: Diagnosis not present

## 2015-10-01 DIAGNOSIS — L570 Actinic keratosis: Secondary | ICD-10-CM | POA: Diagnosis not present

## 2015-10-01 DIAGNOSIS — L905 Scar conditions and fibrosis of skin: Secondary | ICD-10-CM | POA: Diagnosis not present

## 2015-10-03 DIAGNOSIS — S0181XA Laceration without foreign body of other part of head, initial encounter: Secondary | ICD-10-CM | POA: Diagnosis not present

## 2015-10-03 DIAGNOSIS — S0081XA Abrasion of other part of head, initial encounter: Secondary | ICD-10-CM | POA: Diagnosis not present

## 2015-10-03 DIAGNOSIS — W19XXXA Unspecified fall, initial encounter: Secondary | ICD-10-CM | POA: Diagnosis not present

## 2015-10-17 ENCOUNTER — Ambulatory Visit (INDEPENDENT_AMBULATORY_CARE_PROVIDER_SITE_OTHER): Payer: Medicare Other

## 2015-10-17 VITALS — BP 138/60 | HR 107 | Ht 66.5 in | Wt 162.5 lb

## 2015-10-17 DIAGNOSIS — Z0000 Encounter for general adult medical examination without abnormal findings: Secondary | ICD-10-CM

## 2015-10-17 NOTE — Progress Notes (Signed)
Subjective:   Gabriel [name] is a 82 y.o. male who presents for Medicare Annual/Subsequent preventive examination.  Review of Systems:   HRA assessment completed during this visit with Gabriel [name], Gabriel [name].   The Patient was informed that the wellness visit is to identify their future health risk and educate and initiate measures that can reduce risk for increased disease through the lifespan.    NO ROS; Medicare Wellness Visit Last OV:  08/2015 Labs completed: 08/2015 HDL 31; LDL 74; trig 195; chol 144;  Glucose 100;    Lifestyle review and risk: just had birthday; good family support Loves basball. Dtr's  managed to take him to an  [employer] game for Gabriel [name] this year; States he tolerated well;   Is currently in older adult independent living community  HX: Father had CAD;  HTN; medically managed;  OA left hip- managed with Mobic; evaluated by Dr Gabriel [name]? (not sure of name)   Psychosocial: oldest of 5; lost sister and brother Has extended family and reunion planned soon;   Tobacco: smoked when younger, quit in 30's   How many drinks do you have per week?  NO; does not drink   Medications; reviewed and is compliant  BMI: 26  Diet;  adequate  at [facility]  Eats well; states they have a variety of food; enjoys very much  Exercise;  at friends [place]; [place] up with Gabriel [name] M-W-F March-June; attended a course and the instructor  Taught him to use 6 machines:  goes with a friend and does weights Tuesday and Thursday  Leg press; calf press, quad press; chest press; two more for shoulders and arms Still balances in the am at bar and does long stretches; stretches hamstrings with band;  Works buttocks; squeezes x 30 every day; states he was told this would help his balance   HOME SAFETY; [facility] Fall hx; fell x 2 weeks ago stepped in hole at [facility]; fup with area MD and had stitches in right corner of forehead; resolved today;  also sprained toes on right  foot and icing and using epsom salts; no xrays were done States toes are  90% better; states toes are not blue; can move them now without pain;  States  toes are straight; has normal movement and color; no numbness;much better  Get up and go is around 10-12 sec; uses arms to push out of chair Slow gait; may start using walker to keep from falling for awhile; Uses walking stick when out as this seems to help his balance; told him to bring this to the next doctor's apt.   Discussed stopping when getting up from a chair;  Does sit on bed when he gets up at hs and has started using a walker at hs or in and around the community  Takes shower in tub but has 2 grab bars; and pull string; Declines shower bench.  Fall risk: Fear of falling? Does have a fear of falling and very cautious. Suggested he speak to the doctor about this at his next visit of if he falls again;   Has safety bell that calls nurse at bedside; has studio room  Given education on "Fall Prevention in the Home" for more safety tips the patient can apply as appropriate.  Safety features reviewed for safe community; firearms if in the home but states he gave all firearms to grand -sons; smoke alarms; sun protection when outside - wears sun protection;  driving difficulties or accidents/no  Stressors (1-5) small amount of stress; children have had health issues Son and dtr;   Mental Health:  Any emotional problems? Anxious, depressed, irritable, sad or blue? no Denies feeling depressed or hopeless; voices pleasure in daily life/ no  Pain: hip at times; manages with mobic  Cognitive;  MMSE 26/30; very engaging; math was difficult Very good historian; good judgement; affect appropriate; orientation good;  States he has an Assisted living to move to if he needs more help   Fall assessment: unlevel payment at risk Gait: slow and steady Any dizziness when standing up? No but goes slow;  Mobilization and Functional losses from  last year to this year? no  Sleep pattern changes; get up 1 to 2 times; very careful when he gets up.  Urinary or fecal incontinence reviewed: no  ASSESSMENT:  Counseling Health Maintenance Gaps:  Colonoscopy; 08/2004/aged out;  EKG: 08/2015  Hearing: has hearing aids both ears  Ophthalmology exam/ once a year; will set up apt; goes to the eye center at the [address] of [address]; Dr. Tora [name]; may go to [facility] and see her there.  Wears glasses; to Dr. Tye [name] a retinal specialist; takes vit one time a day    Immunizations Due: zostavax; educated; does not remember having the chicken pox   Advanced Directive addressed; has completed and will bring a copy   Individual Goal: continue to exercise; loves family   Health Recommendations and Referrals To schedule eye apt. Continue to exercise!!!  Will fup on Shingles; Educated to check with insurance regarding coverage of Shingles vaccination on Part D or Part B and may have lower co-pay if provided on the Part D side  Barriers to [facility] Current Care Team reviewed and updated   Education provided and lifestyle risk discussed   All Health Maintenance Gaps Reviewed for closure    Cardiac Risk Factors include: advanced age (>60men, >31 women);male gender     Objective:    Vitals: BP 138/60 mmHg  Pulse 107  Ht 5' 6.5" (1.689 m)  Wt 162 lb 8 oz (73.71 kg)  BMI 25.84 kg/m2  SpO2 97%  Body mass index is 25.84 kg/(m^2).  Tobacco History  Smoking status  . Former Smoker -- 15.00 packs/day for .5 years  . Types: Cigarettes  Smokeless tobacco  . Never Used    Comment: smoked 18 and stopped and states he stopped 30's      Counseling given: Yes   Past Medical History  Diagnosis Date  . Hypertension   . GERD (gastroesophageal reflux disease)   . Diverticulosis of colon    Past Surgical History  Procedure Laterality Date  . Colonoscopy  09/23/04    per Dr. [name], clear, no repeats  needed   . Rt inguinal hernia      x2  . Egd and esophageal diatation,  05-10-11    per Dr. [name]    . Cataract extaction, right  6/09    per Dr. [name]  . Hernia repair     Family History  Problem Relation Age of Onset  . Coronary artery disease Father   . Colon cancer Neg Hx   . Stomach cancer Neg Hx   . Esophageal cancer Neg Hx    History  Sexual Activity  . Sexual Activity: No    Outpatient Encounter Prescriptions as of 10/17/2015  Medication Sig  . aspirin 81 MG tablet Take 81 mg by mouth daily.    . Calcium-Magnesium-Vitamin D (CALCIUM MAGNESIUM  PO) Take 1 tablet by mouth every morning.  . Fish Oil OIL Take by mouth daily.  Marland Kitchen GARLIC PO Take 1 capsule by mouth daily.  . hydrochlorothiazide (HYDRODIURIL) 25 MG tablet Take 1 tablet (25 mg total) by mouth every morning.  Marland Kitchen LECITHIN PO Take 1 capsule by mouth every morning.  . meloxicam (MOBIC) 15 MG tablet Take 7.5 mg by mouth daily as needed for pain.   . Multiple Vitamin (MULTIVITAMIN WITH MINERALS) TABS tablet Take 1 tablet by mouth every morning.  . pantoprazole (PROTONIX) 40 MG tablet Take 1 tablet (40 mg total) by mouth daily.  . vitamin C (ASCORBIC ACID) 500 MG tablet Take 500 mg by mouth daily.   Marland Kitchen sulfamethoxazole-trimethoprim (BACTRIM DS,SEPTRA DS) 800-160 MG tablet Take 1 tablet by mouth 2 (two) times daily. (Patient not taking: Reported on 10/17/2015)  . [DISCONTINUED] ciprofloxacin (CIPRO) 500 MG tablet Take 1 tablet (500 mg total) by mouth 2 (two) times daily. (Patient not taking: Reported on 09/23/2015)   No facility-administered encounter medications on file as of 10/17/2015.    Activities of Daily Living In your present state of health, do you have any difficulty performing the following activities: 10/17/2015  Hearing? Y  Vision? N  Difficulty concentrating or making decisions? N  Dressing or bathing? N  Doing errands, shopping? N  Preparing Food and eating ? N  Using the Toilet? N  In the past six  months, have you accidently leaked urine? N  Do you have problems with loss of bowel control? N  Managing your Medications? N  Managing your Finances? N  Housekeeping or managing your Housekeeping? N    Patient Care Team: Laurey Morale, MD as PCP - General   Assessment:     Exercise Activities and Dietary recommendations Current Exercise Habits: Home exercise routine;Structured exercise class, Time (Minutes): 60, Frequency (Times/Week): 5, Weekly Exercise (Minutes/Week): 300, Intensity: Moderate  Goals    . patient     Keep exercising; and stay engaged in the community       Fall Risk Fall Risk  10/17/2015 09/23/2015 05/15/2014  Falls in the past year? Yes No Yes  Number falls in past yr: 1 - 1  Follow up Education provided - -   Depression Screen PHQ 2/9 Scores 10/17/2015 09/23/2015 05/15/2014  PHQ - 2 Score 0 0 0    Cognitive Testing MMSE - Mini Mental State Exam 10/17/2015  Orientation to time 5  Orientation to Place 5  Registration 3  Attention/ Calculation 1  Recall 3  Language- name 2 objects 2  Language- repeat 1  Language- follow 3 step command 3  Language- read & follow direction 1  Write a sentence 1  Copy design 1  Total score 26   Engaged, oriented; overwhelmed with number.  Immunization History  Administered Date(s) Administered  . Influenza Split 01/20/2012, 01/07/2013  . Influenza Whole 02/09/2006, 01/11/2008  . Influenza-Unspecified 01/17/2014  . Pneumococcal Conjugate-13 09/23/2015  . Pneumococcal Polysaccharide-23 03/13/2008  . Td 12/20/2007   Screening Tests Health Maintenance  Topic Date Due  . ZOSTAVAX  10/04/1986  . INFLUENZA VACCINE  11/25/2015  . TETANUS/TDAP  12/19/2017  . PNA vac Low Risk Adult  Completed      Plan:     Will schedule eye apt Will fup with insurance regarding shingles; concerned about expense   During the course of the visit the patient was educated and counseled about the following appropriate screening and  preventive services:   Vaccines  to include Pneumoccal, Influenza, Hepatitis B, Td, Zostavax, HCV  Electrocardiogram  Cardiovascular Disease  Colorectal cancer screening aged out  Diabetes screening  Prostate Cancer Screening  Glaucoma screening  Nutrition counseling adequate  Smoking cessation counseling  Patient Instructions (the written plan) was given to the patient.    Wynetta Fines, RN  10/17/2015

## 2015-10-17 NOTE — Patient Instructions (Addendum)
Gabriel [name] , Thank you for taking time to come for your Medicare Wellness Visit. I appreciate your ongoing commitment to your health goals. Please review the following plan we discussed and let me know if I can assist you in the future.   To schedule eye apt.  Continue to exercise!!!   Will fup on Shingles; Educated to check with insurance regarding coverage of Shingles vaccination on Part D or Part B and may have lower co-pay if provided on the Part D side   These are the goals we discussed: Goals    . patient     Keep exercising; and stay engaged in the community        This is a list of the screening recommended for you and due dates:  Health Maintenance  Topic Date Due  . Shingles Vaccine  10/04/1986  . Flu Shot  11/25/2015  . Tetanus Vaccine  12/19/2017  . Pneumonia vaccines  Completed     Fall Prevention in the Home  Falls can cause injuries. They can happen to people of all ages. There are many things you can do to make your home safe and to help prevent falls.  WHAT CAN I DO ON THE OUTSIDE OF MY HOME?  Regularly fix the edges of walkways and driveways and fix any cracks.  Remove anything that might make you trip as you walk through a door, such as a raised step or threshold.  Trim any bushes or trees on the path to your home.  Use bright outdoor lighting.  Clear any walking paths of anything that might make someone trip, such as rocks or tools.  Regularly check to see if handrails are loose or broken. Make sure that both sides of any steps have handrails.  Any raised decks and porches should have guardrails on the edges.  Have any leaves, snow, or ice cleared regularly.  Use sand or salt on walking paths during winter.  Clean up any spills in your garage right away. This includes oil or grease spills. WHAT CAN I DO IN THE BATHROOM?   Use night lights.  Install grab bars by the toilet and in the tub and shower. Do not use towel bars as grab bars.  Use  non-skid mats or decals in the tub or shower.  If you need to sit down in the shower, use a plastic, non-slip stool.  Keep the floor dry. Clean up any water that spills on the floor as soon as it happens.  Remove soap buildup in the tub or shower regularly.  Attach bath mats securely with double-sided non-slip rug tape.  Do not have throw rugs and other things on the floor that can make you trip. WHAT CAN I DO IN THE BEDROOM?  Use night lights.  Make sure that you have a light by your bed that is easy to reach.  Do not use any sheets or blankets that are too big for your bed. They should not hang down onto the floor.  Have a firm chair that has side arms. You can use this for support while you get dressed.  Do not have throw rugs and other things on the floor that can make you trip. WHAT CAN I DO IN THE KITCHEN?  Clean up any spills right away.  Avoid walking on wet floors.  Keep items that you use a lot in easy-to-reach places.  If you need to reach something above you, use a strong step stool that has a  grab bar.  Keep electrical cords out of the way.  Do not use floor polish or wax that makes floors slippery. If you must use wax, use non-skid floor wax.  Do not have throw rugs and other things on the floor that can make you trip. WHAT CAN I DO WITH MY STAIRS?  Do not leave any items on the stairs.  Make sure that there are handrails on both sides of the stairs and use them. Fix handrails that are broken or loose. Make sure that handrails are as long as the stairways.  Check any carpeting to make sure that it is firmly attached to the stairs. Fix any carpet that is loose or worn.  Avoid having throw rugs at the top or bottom of the stairs. If you do have throw rugs, attach them to the floor with carpet tape.  Make sure that you have a light switch at the top of the stairs and the bottom of the stairs. If you do not have them, ask someone to add them for you. WHAT  ELSE CAN I DO TO HELP PREVENT FALLS?  Wear shoes that:  Do not have high heels.  Have rubber bottoms.  Are comfortable and fit you well.  Are closed at the toe. Do not wear sandals.  If you use a stepladder:  Make sure that it is fully opened. Do not climb a closed stepladder.  Make sure that both sides of the stepladder are locked into place.  Ask someone to hold it for you, if possible.  Clearly mark and make sure that you can see:  Any grab bars or handrails.  First and last steps.  Where the edge of each step is.  Use tools that help you move around (mobility aids) if they are needed. These include:  Canes.  Walkers.  Scooters.  Crutches.  Turn on the lights when you go into a dark area. Replace any light bulbs as soon as they burn out.  Set up your furniture so you have a clear path. Avoid moving your furniture around.  If any of your floors are uneven, fix them.  If there are any pets around you, be aware of where they are.  Review your medicines with your doctor. Some medicines can make you feel dizzy. This can increase your chance of falling. Ask your doctor what other things that you can do to help prevent falls.   This information is not intended to replace advice given to you by your health care provider. Make sure you discuss any questions you have with your health care provider.   Document Released: 02/06/2009 Document Revised: 08/27/2014 Document Reviewed: 05/17/2014 Elsevier Interactive Patient Education 2016 [place] Maintenance, Male A healthy lifestyle and preventative care can promote health and wellness.  Maintain regular health, dental, and eye exams.  Eat a healthy diet. Foods like vegetables, fruits, whole grains, low-fat dairy products, and lean protein foods contain the nutrients you need and are low in calories. Decrease your intake of foods high in solid fats, added sugars, and salt. Get information about a proper  diet from your health care provider, if necessary.  Regular physical exercise is one of the most important things you can do for your health. Most adults should get at least 150 minutes of moderate-intensity exercise (any activity that increases your heart rate and causes you to sweat) each week. In addition, most adults need muscle-strengthening exercises on 2 or more days a week.  Maintain a healthy weight. The body mass index (BMI) is a screening tool to identify possible weight problems. It provides an estimate of body fat based on height and weight. Your health care provider can find your BMI and can help you achieve or maintain a healthy weight. For males 20 years and older:  A BMI below 18.5 is considered underweight.  A BMI of 18.5 to 24.9 is normal.  A BMI of 25 to 29.9 is considered overweight.  A BMI of 30 and above is considered obese.  Maintain normal blood lipids and cholesterol by exercising and minimizing your intake of saturated fat. Eat a balanced diet with plenty of fruits and vegetables. Blood tests for lipids and cholesterol should begin at age 38 and be repeated every 5 years. If your lipid or cholesterol levels are high, you are over age 35, or you are at high risk for heart disease, you may need your cholesterol levels checked more frequently.Ongoing high lipid and cholesterol levels should be treated with medicines if diet and exercise are not working.  If you smoke, find out from your health care provider how to quit. If you do not use tobacco, do not start.  Lung cancer screening is recommended for adults aged 67-80 years who are at high risk for developing lung cancer because of a history of smoking. A yearly low-dose CT scan of the lungs is recommended for people who have at least a 30-pack-year history of smoking and are current smokers or have quit within the past 15 years. A pack year of smoking is smoking an average of 1 pack of cigarettes a day for 1 year (for  example, a 30-pack-year history of smoking could mean smoking 1 pack a day for 30 years or 2 packs a day for 15 years). Yearly screening should continue until the smoker has stopped smoking for at least 15 years. Yearly screening should be stopped for people who develop a health problem that would prevent them from having lung cancer treatment.  If you choose to drink alcohol, do not have more than 2 drinks per day. One drink is considered to be 12 oz (360 mL) of beer, 5 oz (150 mL) of wine, or 1.5 oz (45 mL) of liquor.  Avoid the use of street drugs. Do not share needles with anyone. Ask for help if you need support or instructions about stopping the use of drugs.  High blood pressure causes heart disease and increases the risk of stroke. High blood pressure is more likely to develop in:  People who have blood pressure in the end of the normal range (100-139/85-89 mm Hg).  People who are overweight or obese.  People who are African American.  If you are 8-52 years of age, have your blood pressure checked every 3-5 years. If you are 37 years of age or older, have your blood pressure checked every year. You should have your blood pressure measured twice--once when you are at a hospital or clinic, and once when you are not at a hospital or clinic. Record the average of the two measurements. To check your blood pressure when you are not at a hospital or clinic, you can use:  An automated blood pressure machine at a pharmacy.  A home blood pressure monitor.  If you are 40-12 years old, ask your health care provider if you should take aspirin to prevent heart disease.  Diabetes screening involves taking a blood sample to check your fasting blood sugar level. This  should be done once every 3 years after age 109 if you are at a normal weight and without risk factors for diabetes. Testing should be considered at a younger age or be carried out more frequently if you are overweight and have at least 1  risk factor for diabetes.  Colorectal cancer can be detected and often prevented. Most routine colorectal cancer screening begins at the age of 43 and continues through age 38. However, your health care provider may recommend screening at an earlier age if you have risk factors for colon cancer. On a yearly basis, your health care provider may provide home test kits to check for hidden blood in the stool. A small camera at the end of a tube may be used to directly examine the colon (sigmoidoscopy or colonoscopy) to detect the earliest forms of colorectal cancer. Talk to your health care provider about this at age 61 when routine screening begins. A direct exam of the colon should be repeated every 5-10 years through age 54, unless early forms of precancerous polyps or small growths are found.  People who are at an increased risk for hepatitis B should be screened for this virus. You are considered at high risk for hepatitis B if:  You were born in a country where hepatitis B occurs often. Talk with your health care provider about which countries are considered high risk.  Your parents were born in a high-risk country and you have not received a shot to protect against hepatitis B (hepatitis B vaccine).  You have HIV or AIDS.  You use needles to inject street drugs.  You live with, or have sex with, someone who has hepatitis B.  You are a man who has sex with other men (MSM).  You get hemodialysis treatment.  You take certain medicines for conditions like cancer, organ transplantation, and autoimmune conditions.  Hepatitis C blood testing is recommended for all people born from [birth year] through 1963 and any individual with known risk factors for hepatitis C.  Healthy men should no longer receive prostate-specific antigen (PSA) blood tests as part of routine cancer screening. Talk to your health care provider about prostate cancer screening.  Testicular cancer screening is not recommended for  adolescents or adult males who have no symptoms. Screening includes self-exam, a health care provider exam, and other screening tests. Consult with your health care provider about any symptoms you have or any concerns you have about testicular cancer.  Practice safe sex. Use condoms and avoid high-risk sexual practices to reduce the spread of sexually transmitted infections (STIs).  You should be screened for STIs, including gonorrhea and chlamydia if:  You are sexually active and are younger than 24 years.  You are older than 24 years, and your health care provider tells you that you are at risk for this type of infection.  Your sexual activity has changed since you were last screened, and you are at an increased risk for chlamydia or gonorrhea. Ask your health care provider if you are at risk.  If you are at risk of being infected with HIV, it is recommended that you take a prescription medicine daily to prevent HIV infection. This is called pre-exposure prophylaxis (PrEP). You are considered at risk if:  You are a man who has sex with other men (MSM).  You are a heterosexual man who is sexually active with multiple partners.  You take drugs by injection.  You are sexually active with a partner who has HIV.  Talk with your health care provider about whether you are at high risk of being infected with HIV. If you choose to begin PrEP, you should first be tested for HIV. You should then be tested every 3 months for as long as you are taking PrEP.  Use sunscreen. Apply sunscreen liberally and repeatedly throughout the day. You should seek shade when your shadow is shorter than you. Protect yourself by wearing long sleeves, pants, a wide-brimmed hat, and sunglasses year round whenever you are outdoors.  Tell your health care provider of new moles or changes in moles, especially if there is a change in shape or color. Also, tell your health care provider if a mole is larger than the size of a  pencil eraser.  A one-time screening for abdominal aortic aneurysm (AAA) and surgical repair of large AAAs by ultrasound is recommended for men aged 30-75 years who are current or former smokers.  Stay current with your vaccines (immunizations).   This information is not intended to replace advice given to you by your health care provider. Make sure you discuss any questions you have with your health care provider.   Document Released: 10/09/2007 Document Revised: 05/03/2014 Document Reviewed: 09/07/2010 Elsevier Interactive Patient Education [employer].

## 2015-10-20 NOTE — Progress Notes (Signed)
   Subjective:    Patient ID: Gabriel [name], male    DOB: Feb 22, 1925, 82 y.o.   MRN: [id number]  HPI    Review of Systems     Objective:   Physical Exam        Assessment & Plan:  I have reviewed this encounter and I agree with all of its contents.  Laurey Morale, MD

## 2015-10-27 ENCOUNTER — Ambulatory Visit (INDEPENDENT_AMBULATORY_CARE_PROVIDER_SITE_OTHER): Payer: Medicare Other | Admitting: Family Medicine

## 2015-10-27 ENCOUNTER — Encounter: Payer: Self-pay | Admitting: Family Medicine

## 2015-10-27 VITALS — BP 138/71 | HR 69 | Temp 98.9°F | Ht 66.5 in | Wt 164.0 lb

## 2015-10-27 DIAGNOSIS — S0181XD Laceration without foreign body of other part of head, subsequent encounter: Secondary | ICD-10-CM | POA: Diagnosis not present

## 2015-10-27 DIAGNOSIS — S93601D Unspecified sprain of right foot, subsequent encounter: Secondary | ICD-10-CM

## 2015-10-27 NOTE — Progress Notes (Signed)
   Subjective:    Patient ID: Gabriel [name], male    DOB: [birth year], 82 y.o.   MRN: [id number]  HPI Here to follow up an Urgent Care visit on 10-03-15 after he fell in the driveway at [facility]. He lost his balance getting into his truck and twisted the right foot and fell, with his face striking the pavement. No LOC. He had a laceration over the right eyebrow and this was closed with sutures. They felt he had sprained the foot though no Xrays were taken. He treated the foot with ice packs and Epsom salt soaks. The foot feels much better now and he walks with no pain. He did have the sutures removed about a week later.    Review of Systems  Constitutional: Negative.   HENT: Negative.   Respiratory: Negative.   Cardiovascular: Negative.   Musculoskeletal: Negative.   Neurological: Negative.        Objective:   Physical Exam  Constitutional: He is oriented to person, place, and time. He appears well-developed and well-nourished.  Cardiovascular: Normal rate, regular rhythm, normal heart sounds and intact distal pulses.   Pulmonary/Chest: Effort normal and breath sounds normal.  Musculoskeletal:  The right is normal on exam, no swelling and no tenderness in the foot or the toes   Neurological: He is alert and oriented to person, place, and time.  Skin:  The laceration above the right eyebrow has healed          Assessment & Plan:  Facial laceration and foot sprain, now both have resolved. He will follow up as needed.  Laurey Morale, MD

## 2015-10-27 NOTE — Progress Notes (Signed)
Pre visit review using our clinic review tool, if applicable. No additional management support is needed unless otherwise documented below in the visit note. 

## 2015-12-06 DIAGNOSIS — Z961 Presence of intraocular lens: Secondary | ICD-10-CM | POA: Diagnosis not present

## 2015-12-06 DIAGNOSIS — D3131 Benign neoplasm of right choroid: Secondary | ICD-10-CM | POA: Diagnosis not present

## 2015-12-06 DIAGNOSIS — H5203 Hypermetropia, bilateral: Secondary | ICD-10-CM | POA: Diagnosis not present

## 2015-12-06 DIAGNOSIS — H524 Presbyopia: Secondary | ICD-10-CM | POA: Diagnosis not present

## 2015-12-06 DIAGNOSIS — H25812 Combined forms of age-related cataract, left eye: Secondary | ICD-10-CM | POA: Diagnosis not present

## 2015-12-06 DIAGNOSIS — H52223 Regular astigmatism, bilateral: Secondary | ICD-10-CM | POA: Diagnosis not present

## 2015-12-06 DIAGNOSIS — H3530 Unspecified macular degeneration: Secondary | ICD-10-CM | POA: Diagnosis not present

## 2015-12-30 ENCOUNTER — Ambulatory Visit (INDEPENDENT_AMBULATORY_CARE_PROVIDER_SITE_OTHER): Payer: Medicare Other | Admitting: Family Medicine

## 2015-12-30 ENCOUNTER — Encounter: Payer: Self-pay | Admitting: Family Medicine

## 2015-12-30 VITALS — BP 138/70 | HR 67 | Temp 98.1°F | Ht 66.5 in | Wt 162.0 lb

## 2015-12-30 DIAGNOSIS — J019 Acute sinusitis, unspecified: Secondary | ICD-10-CM | POA: Diagnosis not present

## 2015-12-30 MED ORDER — AZITHROMYCIN 250 MG PO TABS: ORAL_TABLET | ORAL | 0 refills | Status: DC

## 2015-12-30 NOTE — Progress Notes (Signed)
   Subjective:    Patient ID: Gabriel [name], male    DOB: 1924/06/05, 82 y.o.   MRN: [id number]  HPI Here for 5 days of stuffy head, PND, and a dry cough. No fever.    Review of Systems  Constitutional: Negative.   HENT: Positive for congestion, postnasal drip, sinus pressure, sore throat and voice change.   Eyes: Negative.   Respiratory: Positive for cough.        Objective:   Physical Exam  Constitutional: He appears well-developed and well-nourished.  HENT:  Right Ear: External ear normal.  Left Ear: External ear normal.  Nose: Nose normal.  Mouth/Throat: Oropharynx is clear and moist.  Hoarse voice   Eyes: Conjunctivae are normal.  Neck: Neck supple.  Pulmonary/Chest: Effort normal and breath sounds normal.  Lymphadenopathy:    He has cervical adenopathy.          Assessment & Plan:  Sinusitis, treat with a Zpack.  Laurey Morale, MD

## 2015-12-30 NOTE — Progress Notes (Signed)
Pre visit review using our clinic review tool, if applicable. No additional management support is needed unless otherwise documented below in the visit note. 

## 2016-01-01 DIAGNOSIS — H02831 Dermatochalasis of right upper eyelid: Secondary | ICD-10-CM | POA: Diagnosis not present

## 2016-01-01 DIAGNOSIS — H353131 Nonexudative age-related macular degeneration, bilateral, early dry stage: Secondary | ICD-10-CM | POA: Diagnosis not present

## 2016-01-01 DIAGNOSIS — H2512 Age-related nuclear cataract, left eye: Secondary | ICD-10-CM | POA: Diagnosis not present

## 2016-01-01 DIAGNOSIS — Z961 Presence of intraocular lens: Secondary | ICD-10-CM | POA: Diagnosis not present

## 2016-01-01 DIAGNOSIS — D3131 Benign neoplasm of right choroid: Secondary | ICD-10-CM | POA: Diagnosis not present

## 2016-01-01 DIAGNOSIS — H02834 Dermatochalasis of left upper eyelid: Secondary | ICD-10-CM | POA: Diagnosis not present

## 2016-01-13 DIAGNOSIS — H2512 Age-related nuclear cataract, left eye: Secondary | ICD-10-CM | POA: Diagnosis not present

## 2016-01-19 DIAGNOSIS — H25012 Cortical age-related cataract, left eye: Secondary | ICD-10-CM | POA: Diagnosis not present

## 2016-01-19 DIAGNOSIS — H16142 Punctate keratitis, left eye: Secondary | ICD-10-CM | POA: Diagnosis not present

## 2016-01-19 DIAGNOSIS — H2512 Age-related nuclear cataract, left eye: Secondary | ICD-10-CM | POA: Diagnosis not present

## 2016-01-20 DIAGNOSIS — Z23 Encounter for immunization: Secondary | ICD-10-CM | POA: Diagnosis not present

## 2016-02-17 DIAGNOSIS — H353132 Nonexudative age-related macular degeneration, bilateral, intermediate dry stage: Secondary | ICD-10-CM | POA: Diagnosis not present

## 2016-02-17 DIAGNOSIS — D3131 Benign neoplasm of right choroid: Secondary | ICD-10-CM | POA: Diagnosis not present

## 2016-02-17 DIAGNOSIS — H35423 Microcystoid degeneration of retina, bilateral: Secondary | ICD-10-CM | POA: Diagnosis not present

## 2016-02-17 DIAGNOSIS — H43813 Vitreous degeneration, bilateral: Secondary | ICD-10-CM | POA: Diagnosis not present

## 2016-03-23 DIAGNOSIS — M25552 Pain in left hip: Secondary | ICD-10-CM | POA: Diagnosis not present

## 2016-03-30 DIAGNOSIS — Z08 Encounter for follow-up examination after completed treatment for malignant neoplasm: Secondary | ICD-10-CM | POA: Diagnosis not present

## 2016-03-30 DIAGNOSIS — X32XXXD Exposure to sunlight, subsequent encounter: Secondary | ICD-10-CM | POA: Diagnosis not present

## 2016-03-30 DIAGNOSIS — Z85828 Personal history of other malignant neoplasm of skin: Secondary | ICD-10-CM | POA: Diagnosis not present

## 2016-03-30 DIAGNOSIS — C44319 Basal cell carcinoma of skin of other parts of face: Secondary | ICD-10-CM | POA: Diagnosis not present

## 2016-03-30 DIAGNOSIS — L570 Actinic keratosis: Secondary | ICD-10-CM | POA: Diagnosis not present

## 2016-04-06 ENCOUNTER — Encounter: Payer: Self-pay | Admitting: Family Medicine

## 2016-04-06 ENCOUNTER — Ambulatory Visit (INDEPENDENT_AMBULATORY_CARE_PROVIDER_SITE_OTHER): Payer: Medicare Other | Admitting: Family Medicine

## 2016-04-06 VITALS — BP 152/82 | HR 67 | Temp 98.2°F | Ht 66.5 in | Wt 166.0 lb

## 2016-04-06 DIAGNOSIS — Z9109 Other allergy status, other than to drugs and biological substances: Secondary | ICD-10-CM | POA: Diagnosis not present

## 2016-04-06 MED ORDER — METHYLPREDNISOLONE ACETATE 80 MG/ML IJ SUSP
120.0000 mg | Freq: Once | INTRAMUSCULAR | Status: AC
  Administered 2016-04-06: 120 mg via INTRAMUSCULAR

## 2016-04-06 NOTE — Addendum Note (Signed)
Addended by: Aggie Hacker A on: 04/06/2016 03:33 PM   Modules accepted: Orders

## 2016-04-06 NOTE — Progress Notes (Signed)
   Subjective:    Patient ID: AVANEESH GENRICH, male    DOB: 20-Jan-1925, 82 y.o.   MRN: [id number]  HPI Here for 5 days of sinus pressure and a mild frontal headache. Taking Tylenol with mixed relief. No ST or fever or cough.    Review of Systems  Constitutional: Negative.   HENT: Positive for congestion, sinus pain and sinus pressure. Negative for ear pain, postnasal drip and sore throat.   Eyes: Negative.   Respiratory: Negative.   Cardiovascular: Negative.        Objective:   Physical Exam  Constitutional: He appears well-developed and well-nourished.  HENT:  Right Ear: External ear normal.  Left Ear: External ear normal.  Nose: Nose normal.  Mouth/Throat: Oropharynx is clear and moist.  Eyes: Conjunctivae are normal.  Neck: No thyromegaly present.  Cardiovascular: Normal rate, regular rhythm, normal heart sounds and intact distal pulses.   Pulmonary/Chest: Effort normal and breath sounds normal.  Lymphadenopathy:    He has no cervical adenopathy.          Assessment & Plan:  Allergic sinus congestion. Given a steroid shot and he can add Mucinex prn.  Laurey Morale, MD

## 2016-04-06 NOTE — Progress Notes (Signed)
Pre visit review using our clinic review tool, if applicable. No additional management support is needed unless otherwise documented below in the visit note. 

## 2016-04-12 ENCOUNTER — Emergency Department (HOSPITAL_COMMUNITY): Payer: Medicare Other

## 2016-04-12 ENCOUNTER — Telehealth: Payer: Self-pay | Admitting: Family Medicine

## 2016-04-12 ENCOUNTER — Encounter (HOSPITAL_COMMUNITY): Payer: Self-pay

## 2016-04-12 DIAGNOSIS — I10 Essential (primary) hypertension: Secondary | ICD-10-CM | POA: Insufficient documentation

## 2016-04-12 DIAGNOSIS — Z7982 Long term (current) use of aspirin: Secondary | ICD-10-CM | POA: Insufficient documentation

## 2016-04-12 DIAGNOSIS — Z79899 Other long term (current) drug therapy: Secondary | ICD-10-CM | POA: Insufficient documentation

## 2016-04-12 DIAGNOSIS — Z87891 Personal history of nicotine dependence: Secondary | ICD-10-CM | POA: Diagnosis not present

## 2016-04-12 DIAGNOSIS — R51 Headache: Secondary | ICD-10-CM | POA: Insufficient documentation

## 2016-04-12 NOTE — Telephone Encounter (Signed)
If he is not taking Meloxicam, he can take 400 mg of Advil bid for the headaches

## 2016-04-12 NOTE — ED Triage Notes (Addendum)
PT C/O PAIN OVER THE LEFT EYE X2 WEEKS. PT STS HE WAS SEEN BY HIS PCP A WEEK AGO, AND STS THAT IT WAS A SINUS HEADACHE. THEY GAVE A SHOT OF A PAIN MEDICATION, WHICH HELPED, BUT THE PAIN CAME BACK. PT STS HE HAS BEEN TAKING TYLENOL WITH NO RELIEF. PT STS HIS VISION IS GETTING BETTER IN THAT EYE , HE IS S/P CATARACT SX IN September. DENIES N/V, OR LIGHT SENSITIVITY.

## 2016-04-12 NOTE — Telephone Encounter (Signed)
° ° ° ° °  Pt call to say he is still having headaches. Pt is asking if there is something he need to try. He said he has them once a twice a day and they last 30 to 45 minutes.   Would like a call back  [phone]

## 2016-04-13 ENCOUNTER — Emergency Department (HOSPITAL_COMMUNITY)
Admission: EM | Admit: 2016-04-13 | Discharge: 2016-04-13 | Disposition: A | Discharge status: Home / Self Care | Payer: Medicare Other | Attending: Emergency Medicine | Admitting: Emergency Medicine

## 2016-04-13 DIAGNOSIS — R51 Headache: Secondary | ICD-10-CM | POA: Diagnosis not present

## 2016-04-13 DIAGNOSIS — R519 Headache, unspecified: Secondary | ICD-10-CM

## 2016-04-13 LAB — BASIC METABOLIC PANEL
ANION GAP: 11 (ref 5–15)
BUN: 19 mg/dL (ref 6–20)
CO2: 24 mmol/L (ref 22–32)
Calcium: 9.3 mg/dL (ref 8.9–10.3)
Chloride: 99 mmol/L — ABNORMAL LOW (ref 101–111)
Creatinine, Ser: 0.79 mg/dL (ref 0.61–1.24)
GFR calc Af Amer: >60 mL/min (ref >60)
GFR calc non Af Amer: >60 mL/min (ref >60)
GLUCOSE: 118 mg/dL — AB (ref 65–99)
POTASSIUM: 3.6 mmol/L (ref 3.5–5.1)
Sodium: 134 mmol/L — ABNORMAL LOW (ref 135–145)

## 2016-04-13 LAB — CBC WITH DIFFERENTIAL/PLATELET
BASOS ABS: 0.1 10*3/uL (ref 0.0–0.1)
Basophils Relative: 1 %
Eosinophils Absolute: 0.3 10*3/uL (ref 0.0–0.7)
Eosinophils Relative: 3 %
HEMATOCRIT: 43.2 % (ref 39.0–52.0)
Hemoglobin: 15.9 g/dL (ref 13.0–17.0)
LYMPHS PCT: 29 %
Lymphs Abs: 2.7 10*3/uL (ref 0.7–4.0)
MCH: 32.1 pg (ref 26.0–34.0)
MCHC: 36.8 g/dL — AB (ref 30.0–36.0)
MCV: 87.3 fL (ref 78.0–100.0)
MONO ABS: 0.9 10*3/uL (ref 0.1–1.0)
MONOS PCT: 9 %
NEUTROS ABS: 5.2 10*3/uL (ref 1.7–7.7)
Neutrophils Relative %: 57 %
Platelets: 269 10*3/uL (ref 150–400)
RBC: 4.95 MIL/uL (ref 4.22–5.81)
RDW: 13.3 % (ref 11.5–15.5)
WBC: 9.1 10*3/uL (ref 4.0–10.5)

## 2016-04-13 LAB — SEDIMENTATION RATE: Sed Rate: 5 mm/hr (ref 0–16)

## 2016-04-13 MED ORDER — METOCLOPRAMIDE HCL 10 MG PO TABS: 10.0000 mg | ORAL_TABLET | Freq: Four times a day (QID) | ORAL | 0 refills | Status: DC | PRN

## 2016-04-13 MED ORDER — SODIUM CHLORIDE 0.9 % IV SOLN: 1000.0000 mL | INTRAVENOUS | Status: DC

## 2016-04-13 MED ORDER — METOCLOPRAMIDE HCL 5 MG/ML IJ SOLN
10.0000 mg | Freq: Once | INTRAMUSCULAR | Status: AC
  Administered 2016-04-13: 10 mg via INTRAVENOUS
  Filled 2016-04-13: qty 2

## 2016-04-13 MED ORDER — SODIUM CHLORIDE 0.9 % IV SOLN
1000.0000 mL | Freq: Once | INTRAVENOUS | Status: AC
  Administered 2016-04-13: 1000 mL via INTRAVENOUS

## 2016-04-13 MED ORDER — DIPHENHYDRAMINE HCL 50 MG/ML IJ SOLN
25.0000 mg | Freq: Once | INTRAMUSCULAR | Status: AC
  Administered 2016-04-13: 25 mg via INTRAVENOUS
  Filled 2016-04-13: qty 1

## 2016-04-13 NOTE — Telephone Encounter (Signed)
Gabriel [name] to the pt and he was seen at the ED for headache.  Was discharged on Reglan.

## 2016-04-13 NOTE — ED Provider Notes (Signed)
Gabriel [name] Provider Note   CSN: [id number] Arrival date & time: 04/12/16  2200 By signing my name below, I, [name], attest that this documentation has been prepared under the direction and in the presence of Delora Fuel, MD . Electronically Signed: [name], Scribe. 04/13/2016. 3:42 AM.   History   Chief Complaint Chief Complaint  Patient presents with  . Headache   HPI Gabriel [name] is a 82 y.o. male with a hx of HTNand cataracts  who presents to the Emergency Department complaining of persistent left sided headache over left eye onset two weeks ago. Pt describes his pain as aching and rates it as 5/10. No exacerbating factors noted. He was seen by his PCP one week ago and was dx with a sinus headache; he was given a steroid shot with some relief, but pain has since returned. He has taken tylenol with no relief. Pt reports having cataract surgery on his left eye in 12/2015. Pt denies any nausea, vomiting, photophobia, phonophobia or any other associated symptoms.   The history is provided by the patient. No language interpreter was used.   Past Medical History:  Diagnosis Date  . Diverticulosis of colon   . GERD (gastroesophageal reflux disease)   . Hypertension     Patient Active Problem List   Diagnosis Date Noted  . BPH with urinary obstruction 09/23/2015  . Dysphagia 05/07/2011  . Allergic rhinitis 07/09/2009  . EXTERNAL THROMBOSED HEMORRHOIDS 06/11/2009  . Bradycardia 08/23/2007  . DIVERTICULOSIS, COLON 08/23/2007  . Essential hypertension 09/14/2006  . GERD 09/14/2006  . Personal history of other specified diseases(V13.89) 09/14/2006    Past Surgical History:  Procedure Laterality Date  . cataract extaction, right  6/09   per Dr. [name]  . COLONOSCOPY  09/23/04   per Dr. [name], clear, no repeats needed   . EGD and esophageal diatation,  05-10-11   per Dr. [name]    . HERNIA REPAIR    . rt inguinal hernia     x2     Home Medications     Prior to Admission medications   Medication Sig Start Date End Date Taking? Authorizing Provider  aspirin 81 MG tablet Take 81 mg by mouth daily.      Historical Provider, MD  Calcium-Magnesium-Vitamin D (CALCIUM MAGNESIUM PO) Take 1 tablet by mouth every morning.    Historical Provider, MD  GARLIC PO Take 1 capsule by mouth daily.    Historical Provider, MD  hydrochlorothiazide (HYDRODIURIL) 25 MG tablet Take 1 tablet (25 mg total) by mouth every morning. 09/23/15   Laurey Morale, MD  meloxicam (MOBIC) 15 MG tablet Take 7.5 mg by mouth daily as needed for pain.     Historical Provider, MD  Multiple Vitamin (MULTIVITAMIN WITH MINERALS) TABS tablet Take 1 tablet by mouth every morning.    Historical Provider, MD  pantoprazole (PROTONIX) 40 MG tablet Take 1 tablet (40 mg total) by mouth daily. 09/23/15   Laurey Morale, MD  vitamin C (ASCORBIC ACID) 500 MG tablet Take 500 mg by mouth daily. Reported on 10/27/2015    Historical Provider, MD    Family History Family History  Problem Relation Age of Onset  . Coronary artery disease Father   . Colon cancer Neg Hx   . Stomach cancer Neg Hx   . Esophageal cancer Neg Hx     Social History Social History  Substance Use Topics  . Smoking status: Former Smoker    Packs/day: 15.00  Years: 0.50    Types: Cigarettes  . Smokeless tobacco: Never Used     Comment: smoked 18 and stopped and states he stopped 30's   . Alcohol use No    Allergies   Contrast media [iodinated diagnostic agents]  Review of Systems Review of Systems  Eyes: Negative for photophobia.  Gastrointestinal: Negative for nausea and vomiting.  Neurological: Positive for headaches.  All other systems reviewed and are negative.  Physical Exam Updated Vital Signs BP (!) 211/85 (BP Location: Right Arm)   Pulse 70   Temp 98 F (36.7 C) (Oral)   Resp 16   Ht 5\' 7"  (1.702 m)   Wt 165 lb (74.8 kg)   SpO2 98%   BMI 25.84 kg/m   Physical Exam  Constitutional: He is  oriented to person, place, and time. He appears well-developed and well-nourished.  HENT:  Head: Normocephalic and atraumatic.  No sinus tenderness, no tenderness of the front of the temporalis muscle, temporal artery or paracervical muscles.1 cm crusted lesion just to the right side of the nose, suspicious for squamous cell carcinoma.   Eyes: EOM are normal.  Right pupil is 3 mm, left pupil is 5 mm. Fundi show no hemorrhage or exudate.   Neck: Normal range of motion. Neck supple. No JVD present.  Cardiovascular: Normal rate, regular rhythm and normal heart sounds.   No murmur heard. Pulmonary/Chest: Effort normal and breath sounds normal. He has no wheezes. He has no rales. He exhibits no tenderness.  Abdominal: Soft. Bowel sounds are normal. He exhibits no distension and no mass. There is no tenderness.  Musculoskeletal: Normal range of motion. He exhibits no edema.  Lymphadenopathy:    He has no cervical adenopathy.  Neurological: He is alert and oriented to person, place, and time. No cranial nerve deficit. He exhibits normal muscle tone. Coordination normal.  Skin: Skin is warm and dry. No rash noted.  Psychiatric: He has a normal mood and affect. His behavior is normal. Judgment and thought content normal.  Nursing note and vitals reviewed.  ED Treatments / Results  DIAGNOSTIC STUDIES:  Oxygen Saturation is 98% on RA, normal by my interpretation.    COORDINATION OF CARE:  3:41 AM Will order CBC, BMP and sedimentation rate. Discussed treatment plan with pt at bedside and pt agreed to plan.   Labs (all labs ordered are listed, but only abnormal results are displayed) Labs Reviewed  BASIC METABOLIC PANEL - Abnormal; Notable for the following:       Result Value   Sodium 134 (*)    Chloride 99 (*)    Glucose, Bld 118 (*)    All other components within normal limits  CBC WITH DIFFERENTIAL/PLATELET - Abnormal; Notable for the following:    MCHC 36.8 (*)    All other components  within normal limits  SEDIMENTATION RATE    Radiology Ct Head Wo Contrast  Result Date: 04/13/2016 CLINICAL DATA:  Left supraorbital pain for 2 weeks. EXAM: CT HEAD WITHOUT CONTRAST TECHNIQUE: Contiguous axial images were obtained from the base of the skull through the vertex without intravenous contrast. COMPARISON:  08/27/2013 FINDINGS: Brain: There is no intracranial hemorrhage, mass or evidence of acute infarction. There is moderate generalized atrophy. There is moderate chronic microvascular ischemic change. 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 Remote left cerebellar infarction. There is no significant extra-axial fluid collection. No acute intracranial findings are evident. Vascular: No hyperdense vessel or unexpected calcification. Skull: Normal. Negative for fracture or focal  lesion. Sinuses/Orbits: No acute finding. Other: Left mastoid effusion, unchanged. IMPRESSION: No acute intracranial findings. There is moderate generalized atrophy and chronic appearing white matter hypodensities which likely represent small vessel ischemic disease. 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 Unchanged left mastoid effusion. Electronically Signed   By: Andreas Newport M.D.   On: 04/13/2016 00:04    Procedures Procedures (including critical care time)  Medications Ordered in ED Medications  0.9 %  sodium chloride infusion (1,000 mLs Intravenous New Bag/Given 04/13/16 0403)    Followed by  0.9 %  sodium chloride infusion (not administered)  metoCLOPramide (REGLAN) injection 10 mg (10 mg Intravenous Given 04/13/16 0403)  diphenhydrAMINE (BENADRYL) injection 25 mg (25 mg Intravenous Given 04/13/16 0403)     Initial Impression / Assessment and Plan / ED Course  I have reviewed the triage vital signs and the nursing notes.  Pertinent labs & imaging results that were available during my care of the patient were reviewed by me and considered in my  medical decision making (see chart for details).  Clinical Course    Headache for several weeks with a generally benign description. Old records are reviewed and he was seen in 67 office last week with headache which was diagnosed as sinusitis and treated with a single injection of methylprednisolone. He had been sent for CT part of my evaluation and CT shows no evidence of sinusitis. He is given a headache cocktail of normal saline, diphenhydramine, metoclopramide with excellent relief of his headache. Sedimentation rate was obtained and was low, making temporal arteritis very unlikely. He is discharged with prescription for metoclopramide.  Final Clinical Impressions(s) / ED Diagnoses   Final diagnoses:  Headache, unspecified headache type    New Prescriptions New Prescriptions   METOCLOPRAMIDE (REGLAN) 10 MG TABLET    Take 1 tablet (10 mg total) by mouth every 6 (six) hours as needed for nausea (or headache).   I personally performed the services described in this documentation, which was scribed in my presence. The recorded information has been reviewed and is accurate.       Delora Fuel, MD [id number]

## 2016-04-14 DIAGNOSIS — Z961 Presence of intraocular lens: Secondary | ICD-10-CM | POA: Diagnosis not present

## 2016-04-14 DIAGNOSIS — H402223 Chronic angle-closure glaucoma, left eye, severe stage: Secondary | ICD-10-CM | POA: Diagnosis not present

## 2016-04-14 DIAGNOSIS — H40212 Acute angle-closure glaucoma, left eye: Secondary | ICD-10-CM | POA: Diagnosis not present

## 2016-04-15 DIAGNOSIS — H43392 Other vitreous opacities, left eye: Secondary | ICD-10-CM | POA: Diagnosis not present

## 2016-04-15 DIAGNOSIS — Z961 Presence of intraocular lens: Secondary | ICD-10-CM | POA: Diagnosis not present

## 2016-04-15 DIAGNOSIS — H4052X3 Glaucoma secondary to other eye disorders, left eye, severe stage: Secondary | ICD-10-CM | POA: Diagnosis not present

## 2016-04-15 DIAGNOSIS — H4302 Vitreous prolapse, left eye: Secondary | ICD-10-CM | POA: Diagnosis not present

## 2016-04-21 DIAGNOSIS — H4302 Vitreous prolapse, left eye: Secondary | ICD-10-CM | POA: Diagnosis not present

## 2016-04-21 DIAGNOSIS — H4052X3 Glaucoma secondary to other eye disorders, left eye, severe stage: Secondary | ICD-10-CM | POA: Diagnosis not present

## 2016-05-04 DIAGNOSIS — Z9181 History of falling: Secondary | ICD-10-CM | POA: Diagnosis not present

## 2016-05-04 DIAGNOSIS — R2681 Unsteadiness on feet: Secondary | ICD-10-CM | POA: Diagnosis not present

## 2016-05-04 DIAGNOSIS — M6281 Muscle weakness (generalized): Secondary | ICD-10-CM | POA: Diagnosis not present

## 2016-05-06 DIAGNOSIS — M6281 Muscle weakness (generalized): Secondary | ICD-10-CM | POA: Diagnosis not present

## 2016-05-06 DIAGNOSIS — R2681 Unsteadiness on feet: Secondary | ICD-10-CM | POA: Diagnosis not present

## 2016-05-06 DIAGNOSIS — Z9181 History of falling: Secondary | ICD-10-CM | POA: Diagnosis not present

## 2016-05-10 DIAGNOSIS — R2681 Unsteadiness on feet: Secondary | ICD-10-CM | POA: Diagnosis not present

## 2016-05-10 DIAGNOSIS — M6281 Muscle weakness (generalized): Secondary | ICD-10-CM | POA: Diagnosis not present

## 2016-05-10 DIAGNOSIS — Z9181 History of falling: Secondary | ICD-10-CM | POA: Diagnosis not present

## 2016-05-11 DIAGNOSIS — Z961 Presence of intraocular lens: Secondary | ICD-10-CM | POA: Diagnosis not present

## 2016-05-11 DIAGNOSIS — H40212 Acute angle-closure glaucoma, left eye: Secondary | ICD-10-CM | POA: Diagnosis not present

## 2016-05-12 DIAGNOSIS — Z9181 History of falling: Secondary | ICD-10-CM | POA: Diagnosis not present

## 2016-05-12 DIAGNOSIS — R2681 Unsteadiness on feet: Secondary | ICD-10-CM | POA: Diagnosis not present

## 2016-05-12 DIAGNOSIS — M6281 Muscle weakness (generalized): Secondary | ICD-10-CM | POA: Diagnosis not present

## 2016-05-14 DIAGNOSIS — Z9181 History of falling: Secondary | ICD-10-CM | POA: Diagnosis not present

## 2016-05-14 DIAGNOSIS — R2681 Unsteadiness on feet: Secondary | ICD-10-CM | POA: Diagnosis not present

## 2016-05-14 DIAGNOSIS — M6281 Muscle weakness (generalized): Secondary | ICD-10-CM | POA: Diagnosis not present

## 2016-05-17 DIAGNOSIS — M6281 Muscle weakness (generalized): Secondary | ICD-10-CM | POA: Diagnosis not present

## 2016-05-17 DIAGNOSIS — R2681 Unsteadiness on feet: Secondary | ICD-10-CM | POA: Diagnosis not present

## 2016-05-17 DIAGNOSIS — Z9181 History of falling: Secondary | ICD-10-CM | POA: Diagnosis not present

## 2016-05-19 DIAGNOSIS — Z9181 History of falling: Secondary | ICD-10-CM | POA: Diagnosis not present

## 2016-05-19 DIAGNOSIS — M6281 Muscle weakness (generalized): Secondary | ICD-10-CM | POA: Diagnosis not present

## 2016-05-19 DIAGNOSIS — R2681 Unsteadiness on feet: Secondary | ICD-10-CM | POA: Diagnosis not present

## 2016-05-21 DIAGNOSIS — Z9181 History of falling: Secondary | ICD-10-CM | POA: Diagnosis not present

## 2016-05-21 DIAGNOSIS — R2681 Unsteadiness on feet: Secondary | ICD-10-CM | POA: Diagnosis not present

## 2016-05-21 DIAGNOSIS — M6281 Muscle weakness (generalized): Secondary | ICD-10-CM | POA: Diagnosis not present

## 2016-05-24 DIAGNOSIS — R2681 Unsteadiness on feet: Secondary | ICD-10-CM | POA: Diagnosis not present

## 2016-05-24 DIAGNOSIS — Z9181 History of falling: Secondary | ICD-10-CM | POA: Diagnosis not present

## 2016-05-24 DIAGNOSIS — M6281 Muscle weakness (generalized): Secondary | ICD-10-CM | POA: Diagnosis not present

## 2016-05-26 DIAGNOSIS — M6281 Muscle weakness (generalized): Secondary | ICD-10-CM | POA: Diagnosis not present

## 2016-05-26 DIAGNOSIS — R2681 Unsteadiness on feet: Secondary | ICD-10-CM | POA: Diagnosis not present

## 2016-05-26 DIAGNOSIS — Z9181 History of falling: Secondary | ICD-10-CM | POA: Diagnosis not present

## 2016-05-28 DIAGNOSIS — R2681 Unsteadiness on feet: Secondary | ICD-10-CM | POA: Diagnosis not present

## 2016-05-28 DIAGNOSIS — M6281 Muscle weakness (generalized): Secondary | ICD-10-CM | POA: Diagnosis not present

## 2016-05-28 DIAGNOSIS — Z9181 History of falling: Secondary | ICD-10-CM | POA: Diagnosis not present

## 2016-05-31 DIAGNOSIS — M6281 Muscle weakness (generalized): Secondary | ICD-10-CM | POA: Diagnosis not present

## 2016-05-31 DIAGNOSIS — Z9181 History of falling: Secondary | ICD-10-CM | POA: Diagnosis not present

## 2016-05-31 DIAGNOSIS — R2681 Unsteadiness on feet: Secondary | ICD-10-CM | POA: Diagnosis not present

## 2016-06-02 DIAGNOSIS — M6281 Muscle weakness (generalized): Secondary | ICD-10-CM | POA: Diagnosis not present

## 2016-06-02 DIAGNOSIS — Z9181 History of falling: Secondary | ICD-10-CM | POA: Diagnosis not present

## 2016-06-02 DIAGNOSIS — R2681 Unsteadiness on feet: Secondary | ICD-10-CM | POA: Diagnosis not present

## 2016-06-04 DIAGNOSIS — M6281 Muscle weakness (generalized): Secondary | ICD-10-CM | POA: Diagnosis not present

## 2016-06-04 DIAGNOSIS — Z9181 History of falling: Secondary | ICD-10-CM | POA: Diagnosis not present

## 2016-06-04 DIAGNOSIS — R2681 Unsteadiness on feet: Secondary | ICD-10-CM | POA: Diagnosis not present

## 2016-06-07 DIAGNOSIS — M6281 Muscle weakness (generalized): Secondary | ICD-10-CM | POA: Diagnosis not present

## 2016-06-07 DIAGNOSIS — R2681 Unsteadiness on feet: Secondary | ICD-10-CM | POA: Diagnosis not present

## 2016-06-07 DIAGNOSIS — Z9181 History of falling: Secondary | ICD-10-CM | POA: Diagnosis not present

## 2016-06-09 DIAGNOSIS — M6281 Muscle weakness (generalized): Secondary | ICD-10-CM | POA: Diagnosis not present

## 2016-06-09 DIAGNOSIS — Z9181 History of falling: Secondary | ICD-10-CM | POA: Diagnosis not present

## 2016-06-09 DIAGNOSIS — R2681 Unsteadiness on feet: Secondary | ICD-10-CM | POA: Diagnosis not present

## 2016-06-11 DIAGNOSIS — R2681 Unsteadiness on feet: Secondary | ICD-10-CM | POA: Diagnosis not present

## 2016-06-11 DIAGNOSIS — Z9181 History of falling: Secondary | ICD-10-CM | POA: Diagnosis not present

## 2016-06-11 DIAGNOSIS — M6281 Muscle weakness (generalized): Secondary | ICD-10-CM | POA: Diagnosis not present

## 2016-06-14 DIAGNOSIS — Z9181 History of falling: Secondary | ICD-10-CM | POA: Diagnosis not present

## 2016-06-14 DIAGNOSIS — M6281 Muscle weakness (generalized): Secondary | ICD-10-CM | POA: Diagnosis not present

## 2016-06-14 DIAGNOSIS — R2681 Unsteadiness on feet: Secondary | ICD-10-CM | POA: Diagnosis not present

## 2016-06-16 DIAGNOSIS — M6281 Muscle weakness (generalized): Secondary | ICD-10-CM | POA: Diagnosis not present

## 2016-06-16 DIAGNOSIS — Z9181 History of falling: Secondary | ICD-10-CM | POA: Diagnosis not present

## 2016-06-16 DIAGNOSIS — R2681 Unsteadiness on feet: Secondary | ICD-10-CM | POA: Diagnosis not present

## 2016-06-18 DIAGNOSIS — R2681 Unsteadiness on feet: Secondary | ICD-10-CM | POA: Diagnosis not present

## 2016-06-18 DIAGNOSIS — Z9181 History of falling: Secondary | ICD-10-CM | POA: Diagnosis not present

## 2016-06-18 DIAGNOSIS — M6281 Muscle weakness (generalized): Secondary | ICD-10-CM | POA: Diagnosis not present

## 2016-06-21 DIAGNOSIS — Z9181 History of falling: Secondary | ICD-10-CM | POA: Diagnosis not present

## 2016-06-21 DIAGNOSIS — R2681 Unsteadiness on feet: Secondary | ICD-10-CM | POA: Diagnosis not present

## 2016-06-21 DIAGNOSIS — M6281 Muscle weakness (generalized): Secondary | ICD-10-CM | POA: Diagnosis not present

## 2016-06-23 DIAGNOSIS — M6281 Muscle weakness (generalized): Secondary | ICD-10-CM | POA: Diagnosis not present

## 2016-06-23 DIAGNOSIS — Z9181 History of falling: Secondary | ICD-10-CM | POA: Diagnosis not present

## 2016-06-23 DIAGNOSIS — R2681 Unsteadiness on feet: Secondary | ICD-10-CM | POA: Diagnosis not present

## 2016-06-25 DIAGNOSIS — M6281 Muscle weakness (generalized): Secondary | ICD-10-CM | POA: Diagnosis not present

## 2016-06-25 DIAGNOSIS — R2681 Unsteadiness on feet: Secondary | ICD-10-CM | POA: Diagnosis not present

## 2016-06-25 DIAGNOSIS — Z9181 History of falling: Secondary | ICD-10-CM | POA: Diagnosis not present

## 2016-06-28 DIAGNOSIS — M6281 Muscle weakness (generalized): Secondary | ICD-10-CM | POA: Diagnosis not present

## 2016-06-28 DIAGNOSIS — R2681 Unsteadiness on feet: Secondary | ICD-10-CM | POA: Diagnosis not present

## 2016-06-28 DIAGNOSIS — Z9181 History of falling: Secondary | ICD-10-CM | POA: Diagnosis not present

## 2016-06-30 DIAGNOSIS — Z9181 History of falling: Secondary | ICD-10-CM | POA: Diagnosis not present

## 2016-06-30 DIAGNOSIS — M6281 Muscle weakness (generalized): Secondary | ICD-10-CM | POA: Diagnosis not present

## 2016-06-30 DIAGNOSIS — R2681 Unsteadiness on feet: Secondary | ICD-10-CM | POA: Diagnosis not present

## 2016-07-02 DIAGNOSIS — R2681 Unsteadiness on feet: Secondary | ICD-10-CM | POA: Diagnosis not present

## 2016-07-02 DIAGNOSIS — M6281 Muscle weakness (generalized): Secondary | ICD-10-CM | POA: Diagnosis not present

## 2016-07-02 DIAGNOSIS — Z9181 History of falling: Secondary | ICD-10-CM | POA: Diagnosis not present

## 2016-07-05 DIAGNOSIS — M6281 Muscle weakness (generalized): Secondary | ICD-10-CM | POA: Diagnosis not present

## 2016-07-05 DIAGNOSIS — R2681 Unsteadiness on feet: Secondary | ICD-10-CM | POA: Diagnosis not present

## 2016-07-05 DIAGNOSIS — Z9181 History of falling: Secondary | ICD-10-CM | POA: Diagnosis not present

## 2016-07-07 DIAGNOSIS — M6281 Muscle weakness (generalized): Secondary | ICD-10-CM | POA: Diagnosis not present

## 2016-07-07 DIAGNOSIS — Z9181 History of falling: Secondary | ICD-10-CM | POA: Diagnosis not present

## 2016-07-07 DIAGNOSIS — R2681 Unsteadiness on feet: Secondary | ICD-10-CM | POA: Diagnosis not present

## 2016-07-20 DIAGNOSIS — Z85828 Personal history of other malignant neoplasm of skin: Secondary | ICD-10-CM | POA: Diagnosis not present

## 2016-07-20 DIAGNOSIS — L258 Unspecified contact dermatitis due to other agents: Secondary | ICD-10-CM | POA: Diagnosis not present

## 2016-07-20 DIAGNOSIS — Z08 Encounter for follow-up examination after completed treatment for malignant neoplasm: Secondary | ICD-10-CM | POA: Diagnosis not present

## 2016-08-13 DIAGNOSIS — L570 Actinic keratosis: Secondary | ICD-10-CM | POA: Diagnosis not present

## 2016-08-13 DIAGNOSIS — C4442 Squamous cell carcinoma of skin of scalp and neck: Secondary | ICD-10-CM | POA: Diagnosis not present

## 2016-08-13 DIAGNOSIS — X32XXXD Exposure to sunlight, subsequent encounter: Secondary | ICD-10-CM | POA: Diagnosis not present

## 2016-09-09 ENCOUNTER — Telehealth: Payer: Self-pay

## 2016-09-09 NOTE — Telephone Encounter (Signed)
Gabriel [name] called back. Family is giving him  A 89th BD party on 6/11! Scheduled apt for AWV on 6/27 at 10am

## 2016-09-09 NOTE — Telephone Encounter (Signed)
AWV 10/17/2015 Call to schedule this year and left VM to call back

## 2016-09-14 DIAGNOSIS — Z85828 Personal history of other malignant neoplasm of skin: Secondary | ICD-10-CM | POA: Diagnosis not present

## 2016-09-14 DIAGNOSIS — Z08 Encounter for follow-up examination after completed treatment for malignant neoplasm: Secondary | ICD-10-CM | POA: Diagnosis not present

## 2016-09-23 ENCOUNTER — Ambulatory Visit (INDEPENDENT_AMBULATORY_CARE_PROVIDER_SITE_OTHER): Payer: Medicare Other | Admitting: Family Medicine

## 2016-09-23 ENCOUNTER — Encounter: Payer: Self-pay | Admitting: Family Medicine

## 2016-09-23 VITALS — BP 148/87 | HR 66 | Temp 98.4°F | Ht 67.0 in | Wt 159.0 lb

## 2016-09-23 DIAGNOSIS — K219 Gastro-esophageal reflux disease without esophagitis: Secondary | ICD-10-CM

## 2016-09-23 DIAGNOSIS — I10 Essential (primary) hypertension: Secondary | ICD-10-CM | POA: Diagnosis not present

## 2016-09-23 DIAGNOSIS — R001 Bradycardia, unspecified: Secondary | ICD-10-CM

## 2016-09-23 DIAGNOSIS — N138 Other obstructive and reflux uropathy: Secondary | ICD-10-CM | POA: Diagnosis not present

## 2016-09-23 DIAGNOSIS — N401 Enlarged prostate with lower urinary tract symptoms: Secondary | ICD-10-CM

## 2016-09-23 LAB — POC URINALSYSI DIPSTICK (AUTOMATED)
Bilirubin, UA: NEGATIVE
Blood, UA: NEGATIVE
Glucose, UA: NEGATIVE
KETONES UA: NEGATIVE
Leukocytes, UA: NEGATIVE
Nitrite, UA: NEGATIVE
PH UA: 6.5 (ref 5.0–8.0)
PROTEIN UA: NEGATIVE
SPEC GRAV UA: 1.01 (ref 1.010–1.025)
UROBILINOGEN UA: 0.2 U/dL

## 2016-09-23 LAB — BASIC METABOLIC PANEL
BUN: 13 mg/dL (ref 6–23)
CALCIUM: 10 mg/dL (ref 8.4–10.5)
CO2: 30 meq/L (ref 19–32)
Chloride: 98 mEq/L (ref 96–112)
Creatinine, Ser: 0.67 mg/dL (ref 0.40–1.50)
GFR: 118.45 mL/min (ref >60.00)
GLUCOSE: 105 mg/dL — AB (ref 70–99)
Potassium: 4.2 mEq/L (ref 3.5–5.1)
SODIUM: 136 meq/L (ref 135–145)

## 2016-09-23 LAB — CBC WITH DIFFERENTIAL/PLATELET
Basophils Absolute: 0.1 10*3/uL (ref 0.0–0.1)
Basophils Relative: 1.3 % (ref 0.0–3.0)
Eosinophils Absolute: 0.3 10*3/uL (ref 0.0–0.7)
Eosinophils Relative: 3.7 % (ref 0.0–5.0)
HCT: 46.2 % (ref 39.0–52.0)
HEMOGLOBIN: 16 g/dL (ref 13.0–17.0)
LYMPHS ABS: 2.4 10*3/uL (ref 0.7–4.0)
Lymphocytes Relative: 29 % (ref 12.0–46.0)
MCHC: 34.6 g/dL (ref 30.0–36.0)
MCV: 91.8 fl (ref 78.0–100.0)
MONO ABS: 0.8 10*3/uL (ref 0.1–1.0)
Monocytes Relative: 9.5 % (ref 3.0–12.0)
NEUTROS PCT: 56.5 % (ref 43.0–77.0)
Neutro Abs: 4.7 10*3/uL (ref 1.4–7.7)
Platelets: 314 10*3/uL (ref 150.0–400.0)
RBC: 5.03 Mil/uL (ref 4.22–5.81)
RDW: 13 % (ref 11.5–15.5)
WBC: 8.3 10*3/uL (ref 4.0–10.5)

## 2016-09-23 LAB — LIPID PANEL
Cholesterol: 161 mg/dL (ref 0–200)
HDL: 33.4 mg/dL — AB (ref >39.00)
NONHDL: 127.97
TRIGLYCERIDES: 257 mg/dL — AB (ref 0.0–149.0)
Total CHOL/HDL Ratio: 5
VLDL: 51.4 mg/dL — AB (ref 0.0–40.0)

## 2016-09-23 LAB — HEPATIC FUNCTION PANEL
ALT: 32 U/L (ref 0–53)
AST: 33 U/L (ref 0–37)
Albumin: 4.3 g/dL (ref 3.5–5.2)
Alkaline Phosphatase: 75 U/L (ref 39–117)
BILIRUBIN DIRECT: 0.3 mg/dL (ref 0.0–0.3)
TOTAL PROTEIN: 7.1 g/dL (ref 6.0–8.3)
Total Bilirubin: 1.8 mg/dL — ABNORMAL HIGH (ref 0.2–1.2)

## 2016-09-23 LAB — LDL CHOLESTEROL, DIRECT: Direct LDL: 76 mg/dL

## 2016-09-23 LAB — TSH: TSH: 2.98 u[IU]/mL (ref 0.35–4.50)

## 2016-09-23 LAB — PSA: PSA: 2.56 ng/mL (ref 0.10–4.00)

## 2016-09-23 MED ORDER — PANTOPRAZOLE SODIUM 40 MG PO TBEC: 40.0000 mg | DELAYED_RELEASE_TABLET | Freq: Every day | ORAL | 3 refills | Status: DC

## 2016-09-23 MED ORDER — HYDROCHLOROTHIAZIDE 25 MG PO TABS: 25.0000 mg | ORAL_TABLET | Freq: Every morning | ORAL | 3 refills | Status: DC

## 2016-09-23 NOTE — Progress Notes (Signed)
   Subjective:    Patient ID: Gabriel [name], male    DOB: 07/05/1924, 83 y.o.   MRN: [id number]  HPI Here to follow up on issues. He feels great and has no complaints today. He uses a walker all the time now to prevent falls, but he stays active and gets around. His family is planning a huge party next weekend to celebrate his 89th birthday. His BP is stable and his GERD is controlled. His appetite is good and he sleeps well. He had a cataract removed last winter and this has proceeded well. His arthritis pain is controlled on Mobic.    Review of Systems  Constitutional: Negative.   HENT: Negative.   Eyes: Negative.   Respiratory: Negative.   Cardiovascular: Negative.   Gastrointestinal: Negative.   Genitourinary: Negative.   Musculoskeletal: Positive for arthralgias. Negative for back pain, gait problem, joint swelling, myalgias, neck pain and neck stiffness.  Skin: Negative.   Neurological: Negative.   Psychiatric/Behavioral: Negative.        Objective:   Physical Exam  Constitutional: He is oriented to person, place, and time. He appears well-developed and well-nourished. No distress.  HENT:  Head: Normocephalic and atraumatic.  Right Ear: External ear normal.  Left Ear: External ear normal.  Nose: Nose normal.  Mouth/Throat: Oropharynx is clear and moist. No oropharyngeal exudate.  Eyes: Conjunctivae and EOM are normal. Pupils are equal, round, and reactive to light. Right eye exhibits no discharge. Left eye exhibits no discharge. No scleral icterus.  Neck: Neck supple. No JVD present. No tracheal deviation present. No thyromegaly present.  Cardiovascular: Normal rate, regular rhythm, normal heart sounds and intact distal pulses.  Exam reveals no gallop and no friction rub.   No murmur heard. Pulmonary/Chest: Effort normal and breath sounds normal. No respiratory distress. He has no wheezes. He has no rales. He exhibits no tenderness.  Abdominal: Soft. Bowel sounds are  normal. He exhibits no distension and no mass. There is no tenderness. There is no rebound and no guarding.  Genitourinary: Rectum normal, prostate normal and penis normal. Rectal exam shows guaiac negative stool. No penile tenderness.  Musculoskeletal: Normal range of motion. He exhibits no edema or tenderness.  Lymphadenopathy:    He has no cervical adenopathy.  Neurological: He is alert and oriented to person, place, and time. He has normal reflexes. No cranial nerve deficit. He exhibits normal muscle tone. Coordination normal.  Skin: Skin is warm and dry. No rash noted. He is not diaphoretic. No erythema. No pallor.  Psychiatric: He has a normal mood and affect. His behavior is normal. Judgment and thought content normal.          Assessment & Plan:  His GERD and arthritis and HTN are stable. Get fasting labs today to check lipids, etc. He will meet with Manuela Schwartz for a wellness evaluation in a few weeks.  Alysia Penna, MD

## 2016-09-23 NOTE — Patient Instructions (Signed)
WE NOW OFFER   [facility] FAST TRACK!!!  SAME DAY Appointments for ACUTE CARE  Such as: Sprains, Injuries, cuts, abrasions, rashes, muscle pain, joint pain, back pain Colds, flu, sore throats, headache, allergies, cough, fever  Ear pain, sinus and eye infections Abdominal pain, nausea, vomiting, diarrhea, upset stomach Animal/insect bites  3 Easy Ways to Schedule: Walk-In Scheduling Call in scheduling Mychart Sign-up: [email or web address]         

## 2016-09-28 ENCOUNTER — Encounter: Payer: Self-pay | Admitting: Family Medicine

## 2016-09-28 ENCOUNTER — Ambulatory Visit (INDEPENDENT_AMBULATORY_CARE_PROVIDER_SITE_OTHER): Payer: Medicare Other | Admitting: Family Medicine

## 2016-09-28 VITALS — BP 138/76 | HR 56 | Temp 98.4°F | Ht 67.0 in | Wt 159.0 lb

## 2016-09-28 DIAGNOSIS — J069 Acute upper respiratory infection, unspecified: Secondary | ICD-10-CM | POA: Diagnosis not present

## 2016-09-28 NOTE — Progress Notes (Signed)
   Subjective:    Patient ID: Gabriel [name], male    DOB: Jul 06, 1924, 83 y.o.   MRN: [id number]  HPI Here for 2 days of PND and hoarseness. No ST or headache or cough. He is drinking fluids and taking Chlorpheniramine OTC. Today he feels somewhat better.    Review of Systems  Constitutional: Negative.   HENT: Positive for postnasal drip and voice change. Negative for congestion, ear pain, sinus pain, sinus pressure and sore throat.   Eyes: Negative.   Respiratory: Negative.        Objective:   Physical Exam  Constitutional: He appears well-developed and well-nourished.  HENT:  Right Ear: External ear normal.  Left Ear: External ear normal.  Nose: Nose normal.  Mouth/Throat: Oropharynx is clear and moist.  Voice is hoarse   Eyes: Conjunctivae are normal.  Neck: No thyromegaly present.  Pulmonary/Chest: Effort normal and breath sounds normal. No respiratory distress. He has no wheezes. He has no rales.  Lymphadenopathy:    He has no cervical adenopathy.          Assessment & Plan:  Viral URI, continue current regimen. Recheck prn.  Alysia Penna, MD

## 2016-10-19 NOTE — Progress Notes (Signed)
Subjective:   Gabriel [name] is a 83 y.o. male who presents for Medicare Annual/Subsequent preventive examination.  He has good family support and recently celebrated his 66th birthday with family and friends. They had a big party at his church that he really enjoyed.  He stays active in the community and enjoys watching the [employer] on TV and going to [employer].  Review of Systems:  No ROS.  Medicare Wellness Visit. Additional risk factors are reflected in the social history.  Cardiac Risk Factors include: advanced age (>7men, >56 women);dyslipidemia;male gender;hypertension  Sleep patterns: no sleep issues, gets up 2-3 times nightly to void and sleeps 7 hours nightly. Sometimes takes a short day time nap. Home Safety/Smoke Alarms: Feels safe in home. Smoke alarms in place.  Living environment; residence and Firearm Safety: Lives at [facility] on the first floor. apartment, [employer] pl[employer], walk-in shower, equipment: Walkers, Type: [employer], no firearms. Seat Belt Safety/Bike Helmet: Wears seat belt.   Counseling:   Eye Exam- Follows w/ Dr. Katy Fitch and Dr. Zadie Rhine  Dental- Follows w/ Dr. Domingo Mend regularly. Has a granddaughter that is in Human resources officer school and he will start following with her office when she finishes school.  Male:   CCS- Aged out     PSA-  Lab Results  Component Value Date   PSA 2.56 09/23/2016   PSA 2.85 09/23/2015   PSA 2.25 09/20/2014       Objective:    Vitals: BP 132/66   Pulse 75   Ht 5' 5.5" (1.664 m)   Wt 159 lb 4.8 oz (72.3 kg)   SpO2 97%   BMI 26.11 kg/m   Body mass index is 26.11 kg/m.  BP Readings from Last 3 Encounters:  10/20/16 132/66  09/28/16 138/76  09/23/16 (!) 148/87    Tobacco History  Smoking Status  . Former Smoker  . Packs/day: 15.00  . Years: 0.50  . Types: Cigarettes  Smokeless Tobacco  . Never Used    Comment: smoked 18 and stopped and states he stopped 30's      Counseling given:  Not Answered   Past Medical History:  Diagnosis Date  . Arthritis   . Diverticulosis of colon   . GERD (gastroesophageal reflux disease)   . Hypertension    Past Surgical History:  Procedure Laterality Date  . cataract extaction, right  6/09   per Dr. [name]  . CATARACT EXTRACTION Left   . COLONOSCOPY  09/23/04   per Dr. [name], clear, no repeats needed   . EGD and esophageal diatation,  05-10-11   per Dr. [name]    . HERNIA REPAIR    . rt inguinal hernia     x2   Family History  Problem Relation Age of Onset  . Coronary artery disease Father   . Colon cancer Neg Hx   . Stomach cancer Neg Hx   . Esophageal cancer Neg Hx    History  Sexual Activity  . Sexual activity: No    Outpatient Encounter Prescriptions as of 10/20/2016  Medication Sig  . aspirin 81 MG tablet Take 81 mg by mouth daily.    . Calcium-Magnesium-Vitamin D (CALCIUM MAGNESIUM PO) Take 1 tablet by mouth every morning.  . chlorpheniramine (CHLOR-TRIMETON) 4 MG tablet Take 4 mg by mouth daily as needed for allergies.  Marland Kitchen GARLIC PO Take 1 capsule by mouth daily.  . hydrochlorothiazide (HYDRODIURIL) 25 MG tablet Take 1 tablet (25 mg total) by mouth every morning.  Marland Kitchen  meloxicam (MOBIC) 15 MG tablet Take 7.5 mg by mouth daily as needed for pain.   . Multiple Vitamin (MULTIVITAMIN WITH MINERALS) TABS tablet Take 1 tablet by mouth every morning.  . multivitamin-lutein (OCUVITE-LUTEIN) CAPS capsule Take 1 capsule by mouth daily.  . pantoprazole (PROTONIX) 40 MG tablet Take 1 tablet (40 mg total) by mouth daily.  . vitamin C (ASCORBIC ACID) 500 MG tablet Take 500 mg by mouth daily. Reported on 10/27/2015   No facility-administered encounter medications on file as of 10/20/2016.     Activities of Daily Living In your present state of health, do you have any difficulty performing the following activities: 10/20/2016  Hearing? N  Vision? N  Difficulty concentrating or making decisions? N  Walking or climbing stairs? Y   Dressing or bathing? N  Doing errands, shopping? N  Preparing Food and eating ? N  Using the Toilet? N  In the past six months, have you accidently leaked urine? N  Do you have problems with loss of bowel control? N  Managing your Medications? N  Managing your Finances? N  Housekeeping or managing your Housekeeping? N  Some recent data might be hidden    Patient Care Team: Laurey Morale, MD as PCP - Evalina Field, MD as Consulting Physician (Ophthalmology) Zadie Rhine Clent Demark, MD as Consulting Physician (Ophthalmology)   Assessment:    Physical assessment deferred to PCP.  Exercise Activities and Dietary recommendations Current Exercise Habits: Home exercise routine, Type of exercise: strength training/weights;Other - see comments;walking;stretching ("Limbering Up" class at [facility]), Time (Minutes): 30, Frequency (Times/Week): 3, Weekly Exercise (Minutes/Week): 90, Intensity: Mild, Exercise limited by: orthopedic condition(s) (Knee pain)  Diet (meal preparation, eat out, water intake, caffeinated beverages, dairy products, fruits and vegetables): in general, a "healthy" diet  , well balanced, on average, 3 meals per day. Meals provided at [facility]. Regular diet. Usually eats cereal for breakfast, a light lunch, and a larger evening meal. Tries to drink lots of water.      Goals    . Patient Stated          Keep exercising; and stay engaged in the community     . Prevent Falls (pt-stated)          Continue using walker and doing strength and balance exercises.      Fall Risk Fall Risk  10/20/2016 10/17/2015 09/23/2015 05/15/2014  Falls in the past year? Yes Yes No Yes  Number falls in past yr: 2 or more 1 - 1  Injury with Fall? No - - -  Risk for fall due to : Impaired balance/gait;History of fall(s);Impaired mobility - - -  Follow up Falls prevention discussed Education provided - -   Depression Screen PHQ 2/9 Scores 10/20/2016 10/17/2015 09/23/2015 05/15/2014    PHQ - 2 Score 0 0 0 0    Cognitive Function MMSE - Mini Mental State Exam 10/20/2016 10/17/2015  Orientation to time 4 5  Orientation to Place 5 5  Registration 3 3  Attention/ Calculation 5 1  Recall 3 3  Language- name 2 objects 2 2  Language- repeat 1 1  Language- follow 3 step command 3 3  Language- read & follow direction 1 1  Write a sentence 1 1  Copy design 1 1  Total score 29 26        Immunization History  Administered Date(s) Administered  . Influenza Split 01/20/2012, 01/07/2013  . Influenza Whole 02/09/2006, 01/11/2008  . Influenza-Unspecified 01/17/2014,  01/14/2016  . Pneumococcal Conjugate-13 09/23/2015  . Pneumococcal Polysaccharide-23 03/13/2008  . Td 12/20/2007   Screening Tests Health Maintenance  Topic Date Due  . INFLUENZA VACCINE  11/24/2016  . TETANUS/TDAP  12/19/2017  . PNA vac Low Risk Adult  Completed      Plan:    Follow-up w/ PCP as directed.  Bring a copy of your advance directives to your next office visit.  I have personally reviewed and noted the following in the patient's chart:   . Medical and social history . Use of alcohol, tobacco or illicit drugs  . Current medications and supplements . Functional ability and status . Nutritional status . Physical activity . Advanced directives . List of other physicians . Vitals . Screenings to include cognitive, depression, and falls . Referrals and appointments  In addition, I have reviewed and discussed with patient certain preventive protocols, quality metrics, and best practice recommendations. A written personalized care plan for preventive services as well as general preventive health recommendations were provided to patient.     Dorrene German, RN  10/20/2016

## 2016-10-20 ENCOUNTER — Ambulatory Visit: Payer: Medicare Other

## 2016-10-20 ENCOUNTER — Ambulatory Visit (INDEPENDENT_AMBULATORY_CARE_PROVIDER_SITE_OTHER): Payer: Medicare Other | Admitting: Family Medicine

## 2016-10-20 ENCOUNTER — Encounter: Payer: Self-pay | Admitting: Family Medicine

## 2016-10-20 VITALS — BP 132/66 | HR 75 | Resp 16 | Ht 65.5 in | Wt 159.3 lb

## 2016-10-20 DIAGNOSIS — R0981 Nasal congestion: Secondary | ICD-10-CM

## 2016-10-20 DIAGNOSIS — J309 Allergic rhinitis, unspecified: Secondary | ICD-10-CM | POA: Diagnosis not present

## 2016-10-20 DIAGNOSIS — Z0000 Encounter for general adult medical examination without abnormal findings: Secondary | ICD-10-CM | POA: Diagnosis not present

## 2016-10-20 MED ORDER — FEXOFENADINE HCL 180 MG PO TABS: 180.0000 mg | ORAL_TABLET | Freq: Every day | ORAL | 1 refills | Status: DC

## 2016-10-20 MED ORDER — FLUTICASONE PROPIONATE 50 MCG/ACT NA SUSP: 1.0000 | Freq: Two times a day (BID) | NASAL | 3 refills | Status: DC

## 2016-10-20 NOTE — Patient Instructions (Addendum)
Gabriel [name] , Thank you for taking time to come for your Medicare Wellness Visit. I appreciate your ongoing commitment to your health goals. Please review the following plan we discussed and let me know if I can assist you in the future.   Bring a copy of your advance directives to your next office visit.  These are the goals we discussed: Goals    . Patient Stated          Keep exercising; and stay engaged in the community     . Prevent Falls (pt-stated)          Continue using walker and doing strength and balance exercises.       This is a list of the screening recommended for you and due dates:  Health Maintenance  Topic Date Due  . Flu Shot  11/24/2016  . Tetanus Vaccine  12/19/2017  . Pneumonia vaccines  Completed    Preventive Care 52 Years and Older, Male Preventive care refers to lifestyle choices and visits with your health care provider that can promote health and wellness. What does preventive care include?  A yearly physical exam. This is also called an annual well check.  Dental exams once or twice a year.  Routine eye exams. Ask your health care provider how often you should have your eyes checked.  Personal lifestyle choices, including: ? Daily care of your teeth and gums. ? Regular physical activity. ? Eating a healthy diet. ? Avoiding tobacco and drug use. ? Limiting alcohol use. ? Practicing safe sex. ? Taking low doses of aspirin every day. ? Taking vitamin and mineral supplements as recommended by your health care provider. What happens during an annual well check? The services and screenings done by your health care provider during your annual well check will depend on your age, overall health, lifestyle risk factors, and family history of disease. Counseling Your health care provider may ask you questions about your:  Alcohol use.  Tobacco use.  Drug use.  Emotional well-being.  Home and relationship well-being.  Sexual  activity.  Eating habits.  History of falls.  Memory and ability to understand (cognition).  Work and work Statistician.  Screening You may have the following tests or measurements:  Height, weight, and BMI.  Blood pressure.  Lipid and cholesterol levels. These may be checked every 5 years, or more frequently if you are over 81 years old.  Skin check.  Lung cancer screening. You may have this screening every year starting at age 81 if you have a 30-pack-year history of smoking and currently smoke or have quit within the past 15 years.  Fecal occult blood test (FOBT) of the stool. You may have this test every year starting at age 81.  Flexible sigmoidoscopy or colonoscopy. You may have a sigmoidoscopy every 5 years or a colonoscopy every 10 years starting at age 81.  Prostate cancer screening. Recommendations will vary depending on your family history and other risks.  Hepatitis C blood test.  Hepatitis B blood test.  Sexually transmitted disease (STD) testing.  Diabetes screening. This is done by checking your blood sugar (glucose) after you have not eaten for a while (fasting). You may have this done every 1-3 years.  Abdominal aortic aneurysm (AAA) screening. You may need this if you are a current or former smoker.  Osteoporosis. You may be screened starting at age 81 if you are at high risk.  Talk with your health care provider about your test results,  treatment options, and if necessary, the need for more tests. Vaccines Your health care provider may recommend certain vaccines, such as:  Influenza vaccine. This is recommended every year.  Tetanus, diphtheria, and acellular pertussis (Tdap, Td) vaccine. You may need a Td booster every 10 years.  Varicella vaccine. You may need this if you have not been vaccinated.  Zoster vaccine. You may need this after age 18.  Measles, mumps, and rubella (MMR) vaccine. You may need at least one dose of MMR if you were born in  1957 or later. You may also need a second dose.  Pneumococcal 13-valent conjugate (PCV13) vaccine. One dose is recommended after age 30.  Pneumococcal polysaccharide (PPSV23) vaccine. One dose is recommended after age 69.  Meningococcal vaccine. You may need this if you have certain conditions.  Hepatitis A vaccine. You may need this if you have certain conditions or if you travel or work in places where you may be exposed to hepatitis A.  Hepatitis B vaccine. You may need this if you have certain conditions or if you travel or work in places where you may be exposed to hepatitis B.  Haemophilus influenzae type b (Hib) vaccine. You may need this if you have certain risk factors.  Talk to your health care provider about which screenings and vaccines you need and how often you need them. This information is not intended to replace advice given to you by your health care provider. Make sure you discuss any questions you have with your health care provider. Document Released: 05/09/2015 Document Revised: 12/31/2015 Document Reviewed: 02/11/2015 Elsevier Interactive Patient Education  2017 [employer].  There are 2 forms of allergic rhinitis: . Seasonal (hay fever): Caused by an allergy to pollen and/or mold spores in the air. Pollen is the fine powder that comes from the stamen of flowering plants. It can be carried through the air and is easily inhaled. Symptoms are seasonal and usually occur in spring, late summer, and fall. Marland Kitchen Perennial: Caused by other allergens such as dust mites, pet hair or dander, or mold. Symptoms occur year-round.  Symptoms: Your symptoms can vary, depending on the severity of your allergies. Symptoms can include: Sneezing, coughing.itching (mostly eyes, nose, mouth, throat and skin),runny nose,stuffy nose.headache,pressure in the nose and cheeks,ear fullness and popping, sore throat.watery, red, or swollen eyes,dark circles under your eyes,trouble smelling, and  sometimes hives.  Allergic rhinitis cannot be prevented. You can help your symptoms by avoiding the things that you are allergic, including: . Keeping windows closed. This is especially important during high-pollen seasons. . Washing your hands after petting animals. . Using dust- and mite-proof bedding and mattress covers. . Wearing glasses outside to protect your eyes. . Showering before bed to wash off allergens from hair and skin. You can also avoid things that can make your symptoms worse, such as: . aerosol sprays . air pollution . cold temperatures . humidity . irritating fumes . tobacco smoke . wind . wood smoke.   Antihistamines help reduce the sneezing, runny nose, and itchiness of allergies. These come in pill form and as nasal sprays. Allegra,Zyrtec,or Claritin are some examples. Decongestants, such as pseudoephedrine and phenylephrine, help temporarily relieve the stuffy nose of allergies. Decongestants are found in many medicines and come as pills, nose sprays, and nose drops. They could increase heart rate and cause tachycardia and tremor. Nasal Afrin should not be used for more than 3 days because you can become dependent on them. This causes you to feel  even more stopped-up when you try to quit using them.  Nasal sprays: steroids or antihistaminics. Over the counter intranasal sterids: Nasocort,Rhinocort,or Flonase.You won't notice their benefits for up to 2 weeks after starting them. Allergy shots or sublingual tablets when other treatment do not help.This is done by immunologists.

## 2016-10-20 NOTE — Progress Notes (Signed)
HPI:  ACUTE VISIT  Chief Complaint  Patient presents with  . Medicare Wellness    w/ RN   . Nasal Congestion    "Stuffy head" x2 weeks. Has been taking OTC chlorpheniramine w/o improvement.     Mr.Gabriel [name] is a 83 y.o.male here today complaining of "head congestion."  Sx's started 6/1 with head congestion,nasal congestion and dysphonia. He denies stridor, dysphagia, or oral lesions. + "Tearing" eyes. + Fatigue. He was evaluated for same problem on 09/28/16 and according to pt, examination was "fine."  URI   This is a new problem. The current episode started 1 to 4 weeks ago. The problem has been waxing and waning. There has been no fever. Associated symptoms include congestion. Pertinent negatives include no abdominal pain, chest pain, coughing, ear pain, headaches, nausea, neck pain, rash, rhinorrhea, sinus pain, sore throat, swollen glands, vomiting or wheezing. He has tried antihistamine for the symptoms. The treatment provided moderate relief.   No Hx of recent travel. No sick contact. No known insect bite.  Hx of allergies: Denies but I noted allergic rhinitis on his problem list.  OTC medications for this problem: Chlorpheniramine Maleate, which helped some.   Symptoms otherwise stable.  He has history of GERD, symptoms reported as well controlled with current management, Protonix 40 mg daily.   Review of Systems  Constitutional: Positive for fatigue. Negative for appetite change, chills and fever.  HENT: Positive for congestion and voice change. Negative for ear pain, facial swelling, mouth sores, rhinorrhea, sinus pain, sore throat and trouble swallowing.   Eyes: Positive for discharge. Negative for photophobia, pain and visual disturbance.  Respiratory: Negative for cough, shortness of breath and wheezing.   Cardiovascular: Negative for chest pain.  Gastrointestinal: Negative for abdominal pain, nausea and vomiting.  Musculoskeletal: Negative for  neck pain.  Skin: Negative for rash.  Allergic/Immunologic: Positive for environmental allergies.  Neurological: Negative for dizziness, weakness and headaches.  Hematological: Negative for adenopathy. Does not bruise/bleed easily.  Psychiatric/Behavioral: Negative for confusion. The patient is nervous/anxious.       Current Outpatient Prescriptions on File Prior to Visit  Medication Sig Dispense Refill  . aspirin 81 MG tablet Take 81 mg by mouth daily.      . Calcium-Magnesium-Vitamin D (CALCIUM MAGNESIUM PO) Take 1 tablet by mouth every morning.    Marland Kitchen GARLIC PO Take 1 capsule by mouth daily.    . hydrochlorothiazide (HYDRODIURIL) 25 MG tablet Take 1 tablet (25 mg total) by mouth every morning. 90 tablet 3  . meloxicam (MOBIC) 15 MG tablet Take 7.5 mg by mouth daily as needed for pain.     . Multiple Vitamin (MULTIVITAMIN WITH MINERALS) TABS tablet Take 1 tablet by mouth every morning.    . multivitamin-lutein (OCUVITE-LUTEIN) CAPS capsule Take 1 capsule by mouth daily.    . pantoprazole (PROTONIX) 40 MG tablet Take 1 tablet (40 mg total) by mouth daily. 90 tablet 3  . vitamin C (ASCORBIC ACID) 500 MG tablet Take 500 mg by mouth daily. Reported on 10/27/2015     No current facility-administered medications on file prior to visit.      Past Medical History:  Diagnosis Date  . Arthritis   . Diverticulosis of colon   . GERD (gastroesophageal reflux disease)   . Hypertension    Allergies  Allergen Reactions  . Contrast Media [Iodinated Diagnostic Agents] Rash    Social History   Social History  . Marital status: Widowed  Spouse name: N/A  . Number of children: 3  . Years of education: N/A   Occupational History  . retired    Social History Main Topics  . Smoking status: Former Smoker    Packs/day: 15.00    Years: 0.50    Types: Cigarettes  . Smokeless tobacco: Never Used     Comment: smoked 18 and stopped and states he stopped 30's   . Alcohol use No  . Drug use:  No  . Sexual activity: No   Other Topics Concern  . None   Social History Narrative  . None    Vitals:   10/20/16 1019  BP: 132/66  Pulse: 75  Resp: 16  O2 sat at RA 97% Body mass index is 26.11 kg/m.   Physical Exam  Nursing note and vitals reviewed. Constitutional: He is oriented to person, place, and time. He appears well-developed. He does not appear ill. No distress.  HENT:  Head: Atraumatic.  Nose: Septal deviation present. Right sinus exhibits no maxillary sinus tenderness and no frontal sinus tenderness. Left sinus exhibits no maxillary sinus tenderness and no frontal sinus tenderness.  Mouth/Throat: Oropharynx is clear and moist and mucous membranes are normal.  Hearing aids. Nasal voice.  Eyes: EOM are normal. Right conjunctiva is injected. Left conjunctiva is injected.  Mild conjunctivae erythema, bilateral epiphora.  Cardiovascular: Normal rate and regular rhythm.   Respiratory: Effort normal and breath sounds normal. No stridor. No respiratory distress.  Lymphadenopathy:       Head (right side): No submandibular adenopathy present.       Head (left side): No submandibular adenopathy present.    He has no cervical adenopathy.  Neurological: He is alert and oriented to person, place, and time. He has normal strength.  Stable gait, assisted with a walker  Skin: Skin is warm. No rash noted. No erythema.  Psychiatric: His mood appears anxious.  Well groomed, good eye contact.     ASSESSMENT AND PLAN:   Daiquan was seen today for medicare wellness and nasal congestion.  Diagnoses and all orders for this visit:  Nasal congestion  We discussed possible causes, allergies vs residual symptoms after recent URI. Reassured, today examination does not suggest a serious illness. Explained that after a URI, nasal congestion my last a few weeks. Nasal irrigations with saline as needed might help.   Allergic rhinitis, unspecified seasonality, unspecified  trigger  Plan discussed side effects of intranasal steroids as well as antihistaminics. Recommend trying Allegra 180 mg and Flonase intranasal spray. Monitor for facial pain, fever, or worsening symptoms.. Recommend following with PCP as needed.  -     fluticasone (FLONASE) 50 MCG/ACT nasal spray; Place 1 spray into both nostrils 2 (two) times daily. -     fexofenadine (ALLEGRA ALLERGY) 180 MG tablet; Take 1 tablet (180 mg total) by mouth daily.   -Mr. Yahya L Bence was advised to seek attention immediately if sudden worsening of symptoms or to follow with PCP if they persist or new concerns arise.     Gretta Samons G. [place], MD  [facility]. [place] office.

## 2016-10-21 NOTE — Progress Notes (Signed)
I have reviewed documentation from this visit and I agree with recommendations given.  Betty G. Jordan, MD  [facility]. [place] office.   

## 2016-11-10 DIAGNOSIS — H40013 Open angle with borderline findings, low risk, bilateral: Secondary | ICD-10-CM | POA: Diagnosis not present

## 2016-11-10 DIAGNOSIS — H40033 Anatomical narrow angle, bilateral: Secondary | ICD-10-CM | POA: Diagnosis not present

## 2016-11-10 DIAGNOSIS — Z961 Presence of intraocular lens: Secondary | ICD-10-CM | POA: Diagnosis not present

## 2016-11-10 DIAGNOSIS — D3131 Benign neoplasm of right choroid: Secondary | ICD-10-CM | POA: Diagnosis not present

## 2016-12-30 DIAGNOSIS — Z23 Encounter for immunization: Secondary | ICD-10-CM | POA: Diagnosis not present

## 2017-01-05 DIAGNOSIS — C44319 Basal cell carcinoma of skin of other parts of face: Secondary | ICD-10-CM | POA: Diagnosis not present

## 2017-01-05 DIAGNOSIS — X32XXXD Exposure to sunlight, subsequent encounter: Secondary | ICD-10-CM | POA: Diagnosis not present

## 2017-01-05 DIAGNOSIS — L570 Actinic keratosis: Secondary | ICD-10-CM | POA: Diagnosis not present

## 2017-02-15 DIAGNOSIS — Z08 Encounter for follow-up examination after completed treatment for malignant neoplasm: Secondary | ICD-10-CM | POA: Diagnosis not present

## 2017-02-15 DIAGNOSIS — Z85828 Personal history of other malignant neoplasm of skin: Secondary | ICD-10-CM | POA: Diagnosis not present

## 2017-03-29 DIAGNOSIS — X32XXXD Exposure to sunlight, subsequent encounter: Secondary | ICD-10-CM | POA: Diagnosis not present

## 2017-03-29 DIAGNOSIS — Z85828 Personal history of other malignant neoplasm of skin: Secondary | ICD-10-CM | POA: Diagnosis not present

## 2017-03-29 DIAGNOSIS — L570 Actinic keratosis: Secondary | ICD-10-CM | POA: Diagnosis not present

## 2017-03-29 DIAGNOSIS — Z08 Encounter for follow-up examination after completed treatment for malignant neoplasm: Secondary | ICD-10-CM | POA: Diagnosis not present

## 2017-05-09 ENCOUNTER — Ambulatory Visit (INDEPENDENT_AMBULATORY_CARE_PROVIDER_SITE_OTHER): Payer: Medicare Other | Admitting: Family Medicine

## 2017-05-09 ENCOUNTER — Encounter: Payer: Self-pay | Admitting: Family Medicine

## 2017-05-09 VITALS — BP 180/70 | HR 66 | Temp 98.2°F | Wt 161.6 lb

## 2017-05-09 DIAGNOSIS — R202 Paresthesia of skin: Secondary | ICD-10-CM | POA: Diagnosis not present

## 2017-05-09 DIAGNOSIS — R200 Anesthesia of skin: Secondary | ICD-10-CM | POA: Diagnosis not present

## 2017-05-09 LAB — CBC WITH DIFFERENTIAL/PLATELET
BASOS PCT: 1.3 % (ref 0.0–3.0)
Basophils Absolute: 0.1 10*3/uL (ref 0.0–0.1)
EOS ABS: 0.2 10*3/uL (ref 0.0–0.7)
EOS PCT: 2.1 % (ref 0.0–5.0)
HCT: 45.7 % (ref 39.0–52.0)
HEMOGLOBIN: 15.6 g/dL (ref 13.0–17.0)
LYMPHS PCT: 27 % (ref 12.0–46.0)
Lymphs Abs: 2.4 10*3/uL (ref 0.7–4.0)
MCHC: 34.2 g/dL (ref 30.0–36.0)
MCV: 92.8 fl (ref 78.0–100.0)
MONO ABS: 0.9 10*3/uL (ref 0.1–1.0)
Monocytes Relative: 10 % (ref 3.0–12.0)
Neutro Abs: 5.2 10*3/uL (ref 1.4–7.7)
Neutrophils Relative %: 59.6 % (ref 43.0–77.0)
Platelets: 307 10*3/uL (ref 150.0–400.0)
RBC: 4.92 Mil/uL (ref 4.22–5.81)
RDW: 13.5 % (ref 11.5–15.5)
WBC: 8.8 10*3/uL (ref 4.0–10.5)

## 2017-05-09 LAB — BASIC METABOLIC PANEL
BUN: 13 mg/dL (ref 6–23)
CALCIUM: 9.5 mg/dL (ref 8.4–10.5)
CO2: 30 mEq/L (ref 19–32)
CREATININE: 0.64 mg/dL (ref 0.40–1.50)
Chloride: 93 mEq/L — ABNORMAL LOW (ref 96–112)
GFR: 124.71 mL/min (ref >60.00)
Glucose, Bld: 101 mg/dL — ABNORMAL HIGH (ref 70–99)
Potassium: 3.5 mEq/L (ref 3.5–5.1)
Sodium: 133 mEq/L — ABNORMAL LOW (ref 135–145)

## 2017-05-09 LAB — HEPATIC FUNCTION PANEL
ALT: 29 U/L (ref 0–53)
AST: 27 U/L (ref 0–37)
Albumin: 4 g/dL (ref 3.5–5.2)
Alkaline Phosphatase: 74 U/L (ref 39–117)
BILIRUBIN DIRECT: 0.3 mg/dL (ref 0.0–0.3)
BILIRUBIN TOTAL: 1.6 mg/dL — AB (ref 0.2–1.2)
Total Protein: 6.8 g/dL (ref 6.0–8.3)

## 2017-05-09 LAB — HEMOGLOBIN A1C: HEMOGLOBIN A1C: 5.3 % (ref 4.6–6.5)

## 2017-05-09 LAB — VITAMIN B12: VITAMIN B 12: 569 pg/mL (ref 211–911)

## 2017-05-09 LAB — TSH: TSH: 2.68 u[IU]/mL (ref 0.35–4.50)

## 2017-05-09 NOTE — Progress Notes (Signed)
   Subjective:    Patient ID: Gabriel [name], male    DOB: May 30, 1924, 84 y.o.   MRN: [id number]  HPI Here for 4 days on intermittent numbness in both hands and in the lips. No weakness or pain. No changes in speech or vision. He feels great in general.    Review of Systems  Constitutional: Negative.   Respiratory: Negative.   Cardiovascular: Negative.   Gastrointestinal: Negative.   Neurological: Positive for numbness. Negative for dizziness, facial asymmetry, speech difficulty, weakness and headaches.       Objective:   Physical Exam  Constitutional: He is oriented to person, place, and time. He appears well-developed and well-nourished.  Walks with his walker   Eyes: Conjunctivae and EOM are normal.  Neck: No thyromegaly present.  Cardiovascular: Normal rate, regular rhythm, normal heart sounds and intact distal pulses.  Pulmonary/Chest: Effort normal and breath sounds normal. No respiratory distress. He has no wheezes. He has no rales.  Lymphadenopathy:    He has no cervical adenopathy.  Neurological: He is alert and oriented to person, place, and time. No cranial nerve deficit. Coordination normal.          Assessment & Plan:  Possible neuropathy. Get labs today to include an A1c and a B12 level.  Alysia Penna, MD

## 2017-05-13 ENCOUNTER — Telehealth: Payer: Self-pay

## 2017-05-13 MED ORDER — GABAPENTIN 100 MG PO CAPS: 100.0000 mg | ORAL_CAPSULE | Freq: Two times a day (BID) | ORAL | 0 refills | Status: DC

## 2017-05-13 NOTE — Telephone Encounter (Signed)
Per Dr. Sarajane Jews he stated that if pt would Gabapentin 100mg  bid 90 no refills follow up in 2 - 3 weeks.  Called and spoke with pt. Pt advised and voiced understanding. Would like medication to be sent into pharmacy.  Rx has been sent.

## 2017-05-16 ENCOUNTER — Telehealth: Payer: Self-pay | Admitting: Family Medicine

## 2017-05-16 NOTE — Telephone Encounter (Signed)
Sent to PCP ?

## 2017-05-16 NOTE — Telephone Encounter (Signed)
Copied from [place] [id number]. Topic: Quick Communication - See Telephone Encounter >> May 13, 2017  1:52 PM Oneta Rack wrote: CRM for notification. See Telephone encounter for:   05/13/17.   Relation to pt: self Call back number: [phone]    Reason for call:  Patient received VM regarding lab results but would like to know in the event he experiences numbness and tingling what does Dr. Sarajane Jews want him to do,  please call back between 4pm and 5pm asking to speak with [facility] directly.   Tell him to make a follow up appt if these symptoms become more frequent. Alysia Penna, MD

## 2017-05-17 NOTE — Telephone Encounter (Signed)
I have already answered this, but I can call in a medication for him to try if he wishes.

## 2017-05-17 NOTE — Telephone Encounter (Signed)
Most of the time these sensations do not go away but they can get a lot better. After awhile we can increase the dose of the Gabapentin if he wants to

## 2017-05-17 NOTE — Telephone Encounter (Signed)
We have sent in Gabapentin.  Pt stated that his sensations have seem to have subsided some since taking the medication.    Pt had a few questions. Pt was last seen 05/09/2017 Dx with Numbness and tingling  Dx   1) Pt wanted to know if this medication will eventually cure his issues?  2) Is this something that he will have to deal with forever, if not how long do these symptoms normally last?    3) Pt wanted to know the severity of this issues is it serious or not really serious.

## 2017-05-17 NOTE — Telephone Encounter (Signed)
Called and spoke with pt. Pt advised and voiced understanding.  

## 2017-05-18 ENCOUNTER — Telehealth: Payer: Self-pay

## 2017-05-18 NOTE — Telephone Encounter (Signed)
See other message from yesterday spoke with pt about these concerns. Pt voiced understanding. Ok to close this out.

## 2017-05-18 NOTE — Telephone Encounter (Signed)
Copied from [place] [id number]. Topic: Quick Communication - See Telephone Encounter >> May 13, 2017  1:52 PM Oneta Rack wrote: CRM for notification. See Telephone encounter for:   05/13/17.   Relation to pt: self Call back number: [phone]    Reason for call:  Patient received VM regarding lab results but would like to know in the event he experiences numbness and tingling what does Dr. Sarajane Jews want him to do,  please call back between 4pm and 5pm asking to speak with [facility] directly.

## 2017-05-25 NOTE — Telephone Encounter (Signed)
Leave a message with Gabriel [name]

## 2017-05-31 ENCOUNTER — Encounter: Payer: Self-pay | Admitting: Family Medicine

## 2017-05-31 ENCOUNTER — Ambulatory Visit (INDEPENDENT_AMBULATORY_CARE_PROVIDER_SITE_OTHER): Payer: Medicare Other | Admitting: Family Medicine

## 2017-05-31 VITALS — BP 138/72 | HR 62 | Temp 98.3°F | Wt 163.4 lb

## 2017-05-31 DIAGNOSIS — G609 Hereditary and idiopathic neuropathy, unspecified: Secondary | ICD-10-CM

## 2017-05-31 DIAGNOSIS — I10 Essential (primary) hypertension: Secondary | ICD-10-CM

## 2017-05-31 DIAGNOSIS — K219 Gastro-esophageal reflux disease without esophagitis: Secondary | ICD-10-CM

## 2017-05-31 DIAGNOSIS — G629 Polyneuropathy, unspecified: Secondary | ICD-10-CM | POA: Insufficient documentation

## 2017-05-31 MED ORDER — PANTOPRAZOLE SODIUM 40 MG PO TBEC: 40.0000 mg | DELAYED_RELEASE_TABLET | Freq: Every day | ORAL | 3 refills | Status: DC

## 2017-05-31 MED ORDER — GABAPENTIN 100 MG PO CAPS: 100.0000 mg | ORAL_CAPSULE | Freq: Two times a day (BID) | ORAL | 3 refills | Status: DC

## 2017-05-31 MED ORDER — HYDROCHLOROTHIAZIDE 25 MG PO TABS: 25.0000 mg | ORAL_TABLET | Freq: Every morning | ORAL | 3 refills | Status: DC

## 2017-05-31 NOTE — Progress Notes (Signed)
   Subjective:    Patient ID: Gabriel [name], male    DOB: Jul 16, 1924, 84 y.o.   MRN: [id number]  HPI Here to follow up. He feels great. The Gabapentin has worked well and the numbness and tingling in his arms and legs has resolved.    Review of Systems  Constitutional: Negative.   Respiratory: Negative.   Cardiovascular: Negative.   Gastrointestinal: Negative.   Neurological: Negative.        Objective:   Physical Exam  Constitutional: He is oriented to person, place, and time. He appears well-developed and well-nourished.  Neck: No thyromegaly present.  Cardiovascular: Normal rate, regular rhythm, normal heart sounds and intact distal pulses.  Pulmonary/Chest: Effort normal and breath sounds normal. No respiratory distress. He has no wheezes. He has no rales.  Abdominal: Soft. Bowel sounds are normal. He exhibits no distension and no mass. There is no tenderness. There is no rebound and no guarding.  Lymphadenopathy:    He has no cervical adenopathy.  Neurological: He is alert and oriented to person, place, and time.          Assessment & Plan:  His neuropathy and HTN and GERD are stable. Meds were refilled.  Alysia Penna, MD

## 2017-09-27 ENCOUNTER — Encounter: Payer: Self-pay | Admitting: Family Medicine

## 2017-09-27 ENCOUNTER — Ambulatory Visit (INDEPENDENT_AMBULATORY_CARE_PROVIDER_SITE_OTHER): Payer: Medicare Other | Admitting: Family Medicine

## 2017-09-27 VITALS — BP 140/72 | HR 60 | Temp 97.9°F | Ht 65.5 in | Wt 161.6 lb

## 2017-09-27 DIAGNOSIS — G609 Hereditary and idiopathic neuropathy, unspecified: Secondary | ICD-10-CM | POA: Diagnosis not present

## 2017-09-27 DIAGNOSIS — I10 Essential (primary) hypertension: Secondary | ICD-10-CM | POA: Diagnosis not present

## 2017-09-27 DIAGNOSIS — R001 Bradycardia, unspecified: Secondary | ICD-10-CM | POA: Diagnosis not present

## 2017-09-27 DIAGNOSIS — N401 Enlarged prostate with lower urinary tract symptoms: Secondary | ICD-10-CM

## 2017-09-27 DIAGNOSIS — R739 Hyperglycemia, unspecified: Secondary | ICD-10-CM | POA: Diagnosis not present

## 2017-09-27 DIAGNOSIS — N138 Other obstructive and reflux uropathy: Secondary | ICD-10-CM

## 2017-09-27 LAB — CBC WITH DIFFERENTIAL/PLATELET
Basophils Absolute: 0.2 10*3/uL — ABNORMAL HIGH (ref 0.0–0.1)
Basophils Relative: 2 % (ref 0.0–3.0)
Eosinophils Absolute: 0.2 10*3/uL (ref 0.0–0.7)
Eosinophils Relative: 2.8 % (ref 0.0–5.0)
HCT: 45.2 % (ref 39.0–52.0)
Hemoglobin: 15.7 g/dL (ref 13.0–17.0)
LYMPHS ABS: 2.2 10*3/uL (ref 0.7–4.0)
Lymphocytes Relative: 28.7 % (ref 12.0–46.0)
MCHC: 34.8 g/dL (ref 30.0–36.0)
MCV: 91.3 fl (ref 78.0–100.0)
MONO ABS: 0.8 10*3/uL (ref 0.1–1.0)
MONOS PCT: 10.5 % (ref 3.0–12.0)
NEUTROS ABS: 4.3 10*3/uL (ref 1.4–7.7)
NEUTROS PCT: 56 % (ref 43.0–77.0)
Platelets: 305 10*3/uL (ref 150.0–400.0)
RBC: 4.95 Mil/uL (ref 4.22–5.81)
RDW: 13.2 % (ref 11.5–15.5)
WBC: 7.7 10*3/uL (ref 4.0–10.5)

## 2017-09-27 LAB — LIPID PANEL
Cholesterol: 148 mg/dL (ref 0–200)
HDL: 31.2 mg/dL — AB (ref >39.00)
NONHDL: 116.35
TRIGLYCERIDES: 219 mg/dL — AB (ref 0.0–149.0)
Total CHOL/HDL Ratio: 5
VLDL: 43.8 mg/dL — ABNORMAL HIGH (ref 0.0–40.0)

## 2017-09-27 LAB — BASIC METABOLIC PANEL
BUN: 14 mg/dL (ref 6–23)
CALCIUM: 9.8 mg/dL (ref 8.4–10.5)
CO2: 30 meq/L (ref 19–32)
CREATININE: 0.65 mg/dL (ref 0.40–1.50)
Chloride: 99 mEq/L (ref 96–112)
GFR: 122.39 mL/min (ref >60.00)
Glucose, Bld: 106 mg/dL — ABNORMAL HIGH (ref 70–99)
Potassium: 4.2 mEq/L (ref 3.5–5.1)
Sodium: 137 mEq/L (ref 135–145)

## 2017-09-27 LAB — POC URINALSYSI DIPSTICK (AUTOMATED)
BILIRUBIN UA: NEGATIVE
Blood, UA: NEGATIVE
Glucose, UA: NEGATIVE
KETONES UA: NEGATIVE
LEUKOCYTES UA: NEGATIVE
Nitrite, UA: NEGATIVE
Protein, UA: POSITIVE — AB
SPEC GRAV UA: 1.015 (ref 1.010–1.025)
Urobilinogen, UA: 0.2 E.U./dL
pH, UA: 7 (ref 5.0–8.0)

## 2017-09-27 LAB — HEPATIC FUNCTION PANEL
ALT: 29 U/L (ref 0–53)
AST: 27 U/L (ref 0–37)
Albumin: 4.2 g/dL (ref 3.5–5.2)
Alkaline Phosphatase: 78 U/L (ref 39–117)
BILIRUBIN DIRECT: 0.3 mg/dL (ref 0.0–0.3)
BILIRUBIN TOTAL: 1.3 mg/dL — AB (ref 0.2–1.2)
Total Protein: 6.6 g/dL (ref 6.0–8.3)

## 2017-09-27 LAB — HEMOGLOBIN A1C: Hgb A1c MFr Bld: 5.4 % (ref 4.6–6.5)

## 2017-09-27 LAB — LDL CHOLESTEROL, DIRECT: Direct LDL: 72 mg/dL

## 2017-09-27 LAB — TSH: TSH: 3.96 u[IU]/mL (ref 0.35–4.50)

## 2017-09-27 LAB — PSA: PSA: 2.48 ng/mL (ref 0.10–4.00)

## 2017-09-27 NOTE — Progress Notes (Signed)
   Subjective:    Patient ID: Gabriel [name], male    DOB: [birth year], 84 y.o.   MRN: [id number]  HPI Here to follow up some issues. He is pleased with the Gabapentin and the facial numbness has disappeared. He gets around with his walker, although he notes weakness in the legs. He enjoys staying at [facility], and he participates in all the social activities.    Review of Systems  Constitutional: Negative.   HENT: Negative.   Eyes: Negative.   Respiratory: Negative.   Cardiovascular: Negative.   Gastrointestinal: Negative.   Genitourinary: Negative.   Musculoskeletal: Negative.   Skin: Negative.   Neurological: Positive for weakness. Negative for dizziness, tremors, seizures, syncope, facial asymmetry, speech difficulty, light-headedness, numbness and headaches.  Psychiatric/Behavioral: Negative.        Objective:   Physical Exam  Constitutional: He is oriented to person, place, and time. He appears well-developed and well-nourished. No distress.  HENT:  Head: Normocephalic and atraumatic.  Right Ear: External ear normal.  Left Ear: External ear normal.  Nose: Nose normal.  Mouth/Throat: Oropharynx is clear and moist. No oropharyngeal exudate.  Eyes: Pupils are equal, round, and reactive to light. Conjunctivae and EOM are normal. Right eye exhibits no discharge. Left eye exhibits no discharge. No scleral icterus.  Neck: Neck supple. No JVD present. No tracheal deviation present. No thyromegaly present.  Cardiovascular: Normal rate, regular rhythm, normal heart sounds and intact distal pulses. Exam reveals no gallop and no friction rub.  No murmur heard. Pulmonary/Chest: Effort normal and breath sounds normal. No respiratory distress. He has no wheezes. He has no rales. He exhibits no tenderness.  Abdominal: Soft. Bowel sounds are normal. He exhibits no distension and no mass. There is no tenderness. There is no rebound and no guarding.  Genitourinary: Rectum normal, prostate  normal and penis normal. Rectal exam shows guaiac negative stool. No penile tenderness.  Musculoskeletal: Normal range of motion. He exhibits no edema or tenderness.  Lymphadenopathy:    He has no cervical adenopathy.  Neurological: He is alert and oriented to person, place, and time. He has normal reflexes. He displays normal reflexes. No cranial nerve deficit. He exhibits normal muscle tone. Coordination normal.  Skin: Skin is warm and dry. No rash noted. He is not diaphoretic. No erythema. No pallor.  Psychiatric: He has a normal mood and affect. His behavior is normal. Judgment and thought content normal.          Assessment & Plan:  His neuropathy is stable, and we will continue the Gabapentin. His HTN is stable. His GERD is stable. Get fasting labs to check the lipids, etc. His BPH is stable.  Alysia Penna, MD

## 2017-09-29 ENCOUNTER — Telehealth: Payer: Self-pay | Admitting: Family Medicine

## 2017-09-29 DIAGNOSIS — H04123 Dry eye syndrome of bilateral lacrimal glands: Secondary | ICD-10-CM | POA: Diagnosis not present

## 2017-09-29 DIAGNOSIS — Z961 Presence of intraocular lens: Secondary | ICD-10-CM | POA: Diagnosis not present

## 2017-09-29 DIAGNOSIS — H40013 Open angle with borderline findings, low risk, bilateral: Secondary | ICD-10-CM | POA: Diagnosis not present

## 2017-09-29 DIAGNOSIS — H353131 Nonexudative age-related macular degeneration, bilateral, early dry stage: Secondary | ICD-10-CM | POA: Diagnosis not present

## 2017-09-29 DIAGNOSIS — D3131 Benign neoplasm of right choroid: Secondary | ICD-10-CM | POA: Diagnosis not present

## 2017-09-29 NOTE — Telephone Encounter (Signed)
Pt given results and documented in result note 

## 2017-09-29 NOTE — Telephone Encounter (Signed)
Copied from [place] [phone] [phone]. Topic: Quick Communication - Lab Results >> Sep 29, 2017 12:23 PM Virl Cagey, CMA wrote: Called patient to inform them of 09/27/17 urinalysis results. When patient returns call, triage nurse may disclose results.

## 2017-11-14 ENCOUNTER — Telehealth: Payer: Self-pay | Admitting: Family Medicine

## 2017-11-14 NOTE — Telephone Encounter (Signed)
Copied from [place] [id number]. Topic: Quick Communication - See Telephone Encounter >> Nov 14, 2017  4:53 PM Percell Belt A wrote: CRM for notification. See Telephone encounter for: 11/14/17.  Pt called in and stated that Dr fry told him to call if the gabapentin (NEURONTIN) 100 MG capsule [[id number]]  needed to be increased?  He stated that the meds needs to be a little stronger?  He is still having some tingling   Please advise  Best number - [phone]

## 2017-11-15 NOTE — Telephone Encounter (Signed)
Sent to PCP to advised

## 2017-11-15 NOTE — Telephone Encounter (Signed)
Called pt and left a detailed VM. Advised pt that the PCP is out of the office and will return on Thursday. I asked for the pt to call us back if they are able or unable to wait until Thursday to have this refilled.   CRM Created and sent to [facility] pool.

## 2017-11-15 NOTE — Telephone Encounter (Signed)
Pt states that he can wait until Thursday for Dr Sarajane Jews to get back with him about medicine

## 2017-11-17 NOTE — Telephone Encounter (Signed)
Increase the Gabapentin to 300 mg to take bid. Call in #[phone] with 2 rf

## 2017-11-18 MED ORDER — GABAPENTIN 300 MG PO CAPS: 300.0000 mg | ORAL_CAPSULE | Freq: Two times a day (BID) | ORAL | 2 refills | Status: DC

## 2017-11-18 NOTE — Telephone Encounter (Signed)
Called and spoke with pt. Pt stated that he would like to finish off the 100 MG tablets of the gabapentin that he has left over before he starts the 300 MG BID. Pt wanted to know if it is ok for him to takes the 100 MG tablet just twice daily for now or take three times daily of the 100 MG before he increases that does to 600 MG ?  Sent to PCP to advise pt stated that he is worried after reading the possible side effects and he actually feels that his nerve pain isn't as bad as it was the other day.   Pt stated when calling back OK to leave a detailed VM. Ok just call him on Monday.

## 2017-11-18 NOTE — Telephone Encounter (Signed)
Rx has been sent into pt's pharmacy.  

## 2017-11-18 NOTE — Telephone Encounter (Signed)
Called pt and left a VM to call back we need to verify what pharmacy he would like this to go to [place] created and sent to [facility] pool.

## 2017-11-18 NOTE — Telephone Encounter (Signed)
Pt states he has some of the 100 mg tablets of gabapentin left and want to see if it is ok if he takes 3 tablets twice a day to equal to the 300 mg rx that was sent in. Please advise.

## 2017-11-18 NOTE — Telephone Encounter (Signed)
Patient returning call regarding a pharmacy to send the medication to. [place], [place]

## 2017-11-21 NOTE — Telephone Encounter (Signed)
Called pt and left a detailed voice mail per OK to do so by the pt. Advised pt to call back if they have any further questions or concerns.

## 2017-11-21 NOTE — Telephone Encounter (Signed)
Tell him to take 2 tablets BID (total of 4 a day) instead of 3

## 2017-12-27 DIAGNOSIS — Z23 Encounter for immunization: Secondary | ICD-10-CM | POA: Diagnosis not present

## 2018-01-02 ENCOUNTER — Encounter: Payer: Self-pay | Admitting: Family Medicine

## 2018-01-02 ENCOUNTER — Ambulatory Visit (INDEPENDENT_AMBULATORY_CARE_PROVIDER_SITE_OTHER): Payer: Medicare Other | Admitting: Family Medicine

## 2018-01-02 VITALS — BP 130/60 | HR 97 | Temp 99.9°F | Ht 65.5 in | Wt 161.2 lb

## 2018-01-02 DIAGNOSIS — R509 Fever, unspecified: Secondary | ICD-10-CM

## 2018-01-02 DIAGNOSIS — R319 Hematuria, unspecified: Secondary | ICD-10-CM

## 2018-01-02 DIAGNOSIS — N390 Urinary tract infection, site not specified: Secondary | ICD-10-CM

## 2018-01-02 LAB — POCT URINALYSIS DIPSTICK
BILIRUBIN UA: NEGATIVE
Glucose, UA: NEGATIVE
Ketones, UA: NEGATIVE
Leukocytes, UA: NEGATIVE
Nitrite, UA: NEGATIVE
Protein, UA: POSITIVE — AB
Spec Grav, UA: 1.015 (ref 1.010–1.025)
Urobilinogen, UA: 2 E.U./dL — AB
pH, UA: 7 (ref 5.0–8.0)

## 2018-01-02 MED ORDER — SULFAMETHOXAZOLE-TRIMETHOPRIM 800-160 MG PO TABS: 1.0000 | ORAL_TABLET | Freq: Two times a day (BID) | ORAL | 0 refills | Status: DC

## 2018-01-02 NOTE — Progress Notes (Signed)
   Subjective:    Patient ID: Gabriel [name], male    DOB: 1924-09-07, 84 y.o.   MRN: [id number]  HPI Here for the onset this morning of fever to 100 degrees and some generalized weakness. He had some nausea early without vomiting, but this passed. No cough or ST or UTI symptoms. No body aches. No change in stools. No recent travel.    Review of Systems  Constitutional: Positive for fever.  HENT: Negative.   Eyes: Negative.   Respiratory: Negative.   Cardiovascular: Negative.   Gastrointestinal: Positive for nausea. Negative for abdominal distention, abdominal pain, anal bleeding, blood in stool, constipation, diarrhea and vomiting.  Genitourinary: Negative.   Musculoskeletal: Negative.   Skin: Negative.   Neurological: Positive for weakness.       Objective:   Physical Exam  Constitutional: He is oriented to person, place, and time. He appears well-developed and well-nourished. No distress.  HENT:  Right Ear: External ear normal.  Left Ear: External ear normal.  Nose: Nose normal.  Mouth/Throat: Oropharynx is clear and moist.  Eyes: Conjunctivae are normal.  Neck: Neck supple. No thyromegaly present.  Cardiovascular: Normal rate, regular rhythm, normal heart sounds and intact distal pulses.  Pulmonary/Chest: Effort normal and breath sounds normal. No stridor. No respiratory distress. He has no wheezes. He has no rales.  Abdominal: Soft. Bowel sounds are normal. He exhibits no distension and no mass. There is no tenderness. There is no rebound and no guarding. No hernia.  Lymphadenopathy:    He has no cervical adenopathy.  Neurological: He is alert and oriented to person, place, and time.          Assessment & Plan:  This is likely a UTI. Treat with Bactrim DS for 10 days. Culture the sample.  Alysia Penna, MD

## 2018-01-03 LAB — URINE CULTURE
MICRO NUMBER: 91075410
SPECIMEN QUALITY: ADEQUATE

## 2018-01-29 NOTE — Progress Notes (Addendum)
Subjective:   Gabriel [name] is a 84 y.o. male who presents for Medicare Annual/Subsequent preventive examination.  Reports health as good   Lifestyle review and risk:  Is currently in older adult independent living community   HX:  OA left hip- managed with Mobic; evaluated by Dr [name]? (not sure of name)   Psychosocial: oldest of 5; lost sister and brother Has extended family and reunion planned soon;   HOME SAFETY; [facility] 2018: Fall hx; fell x 2 weeks ago stepped in hole at [facility]; fup with area MD and had stitches in right corner of forehead; resolved today;  also sprained toes on right foot and icing and using epsom salts; no xrays were done States toes are  90% better; states toes are not blue; can move them now without pain;  States  toes are straight; has normal movement and color; no numbness;much better  Fall 2019  [facility] [place]  In independent living Has a studio apt - had a friend there and he went  No falls    Diet BMI 25  Labs 09/27/2017 and schedule Office visit June 2020 [address] Breakfast; eats every am- fix eggs over medium One link sausage; 1/2 english muffin, OJ, coffee; prunes Lunch snack Supper goes back to eat at [facility] Gets 2 meals a day  Can bring food back to the room as fruit etc  Keeps cookies in cookie jar  Creme cookie  Has yogurt every night   Exercise Went to class; limbering up with janet M-W and Friday 30 minutes Walking with walker  Helped with Golf tournament    There are no preventive care reminders to display for this patient. PSA 09/2017 2.48  Colonoscopy 04/2011  Discussed shingrix;  States they were 100.00 so he has postponed for now   Eye exam by Dr. Katy Fitch in July  Had to buy new glasses  Dr. Katy Fitch did both his cataracts  OD was blurred and went back in  Referred to Dr. Zadie Rhine  States the gel in this eye was discolored  Removed gel and put new gel in and eye has been good  sense   Has hearing aids   C/o of facial numbness in January 2019  Now this is better with med Also had recent UTI and this was tx    Cardiac Risk Factors include: advanced age (>58men, >40 women);dyslipidemia;family history of premature cardiovascular disease;hypertension;male gender     Objective:    Vitals: BP (!) 160/70   Pulse 73   Ht 5' 7.5" (1.715 m)   Wt 162 lb (73.5 kg)   SpO2 98%   BMI 25.00 kg/m   Body mass index is 25 kg/m.  Advanced Directives 01/30/2018 10/20/2016 04/12/2016 10/17/2015  Does Patient Have a Medical Advance Directive? Yes Yes Yes Yes  Type of Advance Directive - Living will Living will -  Does patient want to make changes to medical advance directive? - - No - Patient declined -  Copy of [place] in Chart? - - - No - copy requested    Tobacco Social History   Tobacco Use  Smoking Status Former Smoker  . Packs/day: 15.00  . Years: 0.50  . Pack years: 7.50  . Types: Cigarettes  Smokeless Tobacco Never Used  Tobacco Comment   smoked 18 and stopped and states he stopped 30's      Counseling given: Yes Comment: smoked 16 and stopped and states he stopped  30's    Clinical Intake:    Past Medical History:  Diagnosis Date  . Arthritis   . Diverticulosis of colon   . GERD (gastroesophageal reflux disease)   . Hypertension    Past Surgical History:  Procedure Laterality Date  . cataract extaction, right  6/09   per Dr. [name]  . CATARACT EXTRACTION Left   . COLONOSCOPY  09/23/04   per Dr. [name], clear, no repeats needed   . EGD and esophageal diatation,  05-10-11   per Dr. [name]    . HERNIA REPAIR    . rt inguinal hernia     x2   Family History  Problem Relation Age of Onset  . Coronary artery disease Father   . Colon cancer Neg Hx   . Stomach cancer Neg Hx   . Esophageal cancer Neg Hx    Social History   Socioeconomic History  . Marital status: Widowed    Spouse name: Not on file  . Number of  children: 3  . Years of education: Not on file  . Highest education level: Not on file  Occupational History  . Occupation: retired  Scientific laboratory technician  . Financial resource strain: Not on file  . Food insecurity:    Worry: Not on file    Inability: Not on file  . Transportation needs:    Medical: Not on file    Non-medical: Not on file  Tobacco Use  . Smoking status: Former Smoker    Packs/day: 15.00    Years: 0.50    Pack years: 7.50    Types: Cigarettes  . Smokeless tobacco: Never Used  . Tobacco comment: smoked 16 and stopped and states he stopped 30's   Substance and Sexual Activity  . Alcohol use: No    Alcohol/week: 0.0 standard drinks  . Drug use: No  . Sexual activity: Never    Birth control/protection: Abstinence  Lifestyle  . Physical activity:    Days per week: Not on file    Minutes per session: Not on file  . Stress: Not on file  Relationships  . Social connections:    Talks on phone: Not on file    Gets together: Not on file    Attends religious service: Not on file    Active member of club or organization: Not on file    Attends meetings of clubs or organizations: Not on file    Relationship status: Not on file  Other Topics Concern  . Not on file  Social History Narrative  . Not on file    Outpatient Encounter Medications as of 01/30/2018  Medication Sig  . aspirin 81 MG tablet Take 81 mg by mouth daily.    . Calcium-Magnesium-Vitamin D (CALCIUM MAGNESIUM PO) Take 1 tablet by mouth every morning.  . gabapentin (NEURONTIN) 300 MG capsule Take 1 capsule (300 mg total) by mouth 2 (two) times daily.  Marland Kitchen GARLIC PO Take 1 capsule by mouth daily.  . hydrochlorothiazide (HYDRODIURIL) 25 MG tablet Take 1 tablet (25 mg total) by mouth every morning.  . meloxicam (MOBIC) 15 MG tablet Take 7.5 mg by mouth daily as needed for pain.   . Multiple Vitamin (MULTIVITAMIN WITH MINERALS) TABS tablet Take 1 tablet by mouth every morning.  . pantoprazole (PROTONIX) 40 MG  tablet Take 1 tablet (40 mg total) by mouth daily.  . vitamin C (ASCORBIC ACID) 500 MG tablet Take 500 mg by mouth daily. Reported on 10/27/2015  . multivitamin-lutein (OCUVITE-LUTEIN) CAPS  capsule Take 1 capsule by mouth daily.  . [DISCONTINUED] sulfamethoxazole-trimethoprim (BACTRIM DS,SEPTRA DS) 800-160 MG tablet Take 1 tablet by mouth 2 (two) times daily.   No facility-administered encounter medications on file as of 01/30/2018.     Activities of Daily Living In your present state of health, do you have any difficulty performing the following activities: 01/30/2018  Hearing? Y  Comment had hearing aids  Vision? Y  Difficulty concentrating or making decisions? N  Walking or climbing stairs? Y  Dressing or bathing? N  Doing errands, shopping? N  Preparing Food and eating ? N  Using the Toilet? N  In the past six months, have you accidently leaked urine? N  Do you have problems with loss of bowel control? N  Managing your Medications? N  Managing your Finances? N  Housekeeping or managing your Housekeeping? N  Some recent data might be hidden    Patient Care Team: Laurey Morale, MD as PCP - Evalina Field, MD as Consulting Physician (Ophthalmology) Zadie Rhine Clent Demark, MD as Consulting Physician (Ophthalmology)   Assessment:   This is a routine wellness examination for Gabriel [name].  Exercise Activities and Dietary recommendations Current Exercise Habits: Home exercise routine, Type of exercise: strength training/weights;walking, Time (Minutes): 30, Frequency (Times/Week): 3, Weekly Exercise (Minutes/Week): 90, Intensity: Moderate  Goals    . Patient Stated     Keep exercising; and stay engaged in the community     . Patient Stated     Going to the [employer] Wednesday night     . Prevent Falls (pt-stated)     Continue using walker and doing strength and balance exercises.       Fall Risk Fall Risk  01/30/2018 10/20/2016 10/17/2015 09/23/2015 05/15/2014  Falls in the  past year? No Yes Yes No Yes  Number falls in past yr: - 2 or more 1 - 1  Injury with Fall? - No - - -  Risk for fall due to : - Impaired balance/gait;History of fall(s);Impaired mobility - - -  Follow up - Falls prevention discussed Education provided - -       Depression Screen PHQ 2/9 Scores 01/30/2018 10/20/2016 10/17/2015 09/23/2015  PHQ - 2 Score 0 0 0 0    Cognitive Function MMSE - Mini Mental State Exam 01/30/2018 10/20/2016 10/17/2015  Not completed: (No Data) - -  Orientation to time - 4 5  Orientation to Place - 5 5  Registration - 3 3  Attention/ Calculation - 5 1  Recall - 3 3  Language- name 2 objects - 2 2  Language- repeat - 1 1  Language- follow 3 step command - 3 3  Language- read & follow direction - 1 1  Write a sentence - 1 1  Copy design - 1 1  Total score - 29 26    memory very good at today's assessment  Discusses his plans; no falls and doing well      Immunization History  Administered Date(s) Administered  . Influenza Split 01/20/2012, 01/07/2013  . Influenza Whole 02/09/2006, 01/11/2008  . Influenza, High Dose Seasonal PF 12/27/2017  . Influenza-Unspecified 01/17/2014, 01/14/2016, 12/30/2016  . Pneumococcal Conjugate-13 09/23/2015  . Pneumococcal Polysaccharide-23 03/13/2008  . Td 12/20/2007     Screening Tests Health Maintenance  Topic Date Due  . FOOT EXAM  01/31/2019 (Originally 10/03/1934)  . TETANUS/TDAP  02/24/2019 (Originally 12/19/2017)  . URINE MICROALBUMIN  02/25/2019 (Originally 10/03/1934)  . HEMOGLOBIN A1C  03/29/2018  . OPHTHALMOLOGY  EXAM  10/24/2018  . INFLUENZA VACCINE  Completed  . PNA vac Low Risk Adult  Completed       Plan:      PCP Notes  Health Maintenance BP 160/70 so please ask the nurse to check your BP a couple of times and if it is still up, to make an apt with Dr. Sarajane Jews  Has sausage link this am  No dizziness or issues; Will come see Dr. Sarajane Jews if his BP remains elevated > than 160/80  Had flu  vaccine  Shingrix is to expensive  Will take tdap at pharmacy or if needed due to injury   Abnormal Screens  none  Referrals  none  Patient concerns; None  Nurse Concerns; As noted Very nice 84 yo doing well, no falls this year Understands his meds. Memory is very good  Out today with walker and plans for the afternoon Stable at [facility]  Eating well   Next PCP apt June 2020       I have personally reviewed and noted the following in the patient's chart:   . Medical and social history . Use of alcohol, tobacco or illicit drugs  . Current medications and supplements . Functional ability and status . Nutritional status . Physical activity . Advanced directives . List of other physicians . Hospitalizations, surgeries, and ER visits in previous 12 months . Vitals . Screenings to include cognitive, depression, and falls . Referrals and appointments  In addition, I have reviewed and discussed with patient certain preventive protocols, quality metrics, and best practice recommendations. A written personalized care plan for preventive services as well as general preventive health recommendations were provided to patient.     [email or web address], RN  01/30/2018  I have reviewed this note and agree with its contents.  Alysia Penna, MD

## 2018-01-30 ENCOUNTER — Ambulatory Visit (INDEPENDENT_AMBULATORY_CARE_PROVIDER_SITE_OTHER): Payer: Medicare Other

## 2018-01-30 VITALS — BP 160/70 | HR 73 | Ht 67.5 in | Wt 162.0 lb

## 2018-01-30 DIAGNOSIS — Z0000 Encounter for general adult medical examination without abnormal findings: Secondary | ICD-10-CM

## 2018-01-30 NOTE — Patient Instructions (Addendum)
Gabriel [name] , Thank you for taking time to come for your Medicare Wellness Visit. I appreciate your ongoing commitment to your health goals. Please review the following plan we discussed and let me know if I can assist you in the future.   BP 160/70 today,  so please ask the nurse to check your BP a couple of times this week and next week  and if it is still up, to make an apt with Dr. Sarajane Jews  ( just took BP pill before coming to the office )    You need a tetanus if you fall and have a dirty wound A Tetanus is recommended every 10 years. Medicare covers a tetanus if you have a cut or wound; otherwise, there may be a charge. If you had not had a tetanus with pertusses, known as the Tdap, you can take this anytime.   Shingrix is a vaccine for the prevention of Shingles in Adults 50 and older.  If you are on Medicare, the shingrix is covered under your Part D plan, so you will take both of the vaccines in the series at your pharmacy. Please check with your benefits regarding applicable copays or out of pocket expenses.  The Shingrix is given in 2 vaccines approx 8 weeks apart. You must receive the 2nd dose prior to 6 months from receipt of the first. Please have the pharmacist print out you Immunization  dates for our office records     These are the goals we discussed: Goals    . Patient Stated     Keep exercising; and stay engaged in the community     . Patient Stated     Going to the [employer] Wednesday night     . Prevent Falls (pt-stated)     Continue using walker and doing strength and balance exercises.       This is a list of the screening recommended for you and due dates:  Health Maintenance  Topic Date Due  . Complete foot exam   01/31/2019*  . Tetanus Vaccine  02/24/2019*  . Urine Protein Check  02/25/2019*  . Hemoglobin A1C  03/29/2018  . Eye exam for diabetics  10/24/2018  . Flu Shot  Completed  . Pneumonia vaccines  Completed  *Topic was postponed. The date  shown is not the original due date.      Fall Prevention in the Home Falls can cause injuries. They can happen to people of all ages. There are many things you can do to make your home safe and to help prevent falls. What can I do on the outside of my home?  Regularly fix the edges of walkways and driveways and fix any cracks.  Remove anything that might make you trip as you walk through a door, such as a raised step or threshold.  Trim any bushes or trees on the path to your home.  Use bright outdoor lighting.  Clear any walking paths of anything that might make someone trip, such as rocks or tools.  Regularly check to see if handrails are loose or broken. Make sure that both sides of any steps have handrails.  Any raised decks and porches should have guardrails on the edges.  Have any leaves, snow, or ice cleared regularly.  Use sand or salt on walking paths during winter.  Clean up any spills in your garage right away. This includes oil or grease spills. What can I do in the bathroom?  Use night lights.  Install grab bars by the toilet and in the tub and shower. Do not use towel bars as grab bars.  Use non-skid mats or decals in the tub or shower.  If you need to sit down in the shower, use a plastic, non-slip stool.  Keep the floor dry. Clean up any water that spills on the floor as soon as it happens.  Remove soap buildup in the tub or shower regularly.  Attach bath mats securely with double-sided non-slip rug tape.  Do not have throw rugs and other things on the floor that can make you trip. What can I do in the bedroom?  Use night lights.  Make sure that you have a light by your bed that is easy to reach.  Do not use any sheets or blankets that are too big for your bed. They should not hang down onto the floor.  Have a firm chair that has side arms. You can use this for support while you get dressed.  Do not have throw rugs and other things on the floor  that can make you trip. What can I do in the kitchen?  Clean up any spills right away.  Avoid walking on wet floors.  Keep items that you use a lot in easy-to-reach places.  If you need to reach something above you, use a strong step stool that has a grab bar.  Keep electrical cords out of the way.  Do not use floor polish or wax that makes floors slippery. If you must use wax, use non-skid floor wax.  Do not have throw rugs and other things on the floor that can make you trip. What can I do with my stairs?  Do not leave any items on the stairs.  Make sure that there are handrails on both sides of the stairs and use them. Fix handrails that are broken or loose. Make sure that handrails are as long as the stairways.  Check any carpeting to make sure that it is firmly attached to the stairs. Fix any carpet that is loose or worn.  Avoid having throw rugs at the top or bottom of the stairs. If you do have throw rugs, attach them to the floor with carpet tape.  Make sure that you have a light switch at the top of the stairs and the bottom of the stairs. If you do not have them, ask someone to add them for you. What else can I do to help prevent falls?  Wear shoes that: ? Do not have high heels. ? Have rubber bottoms. ? Are comfortable and fit you well. ? Are closed at the toe. Do not wear sandals.  If you use a stepladder: ? Make sure that it is fully opened. Do not climb a closed stepladder. ? Make sure that both sides of the stepladder are locked into place. ? Ask someone to hold it for you, if possible.  Clearly mark and make sure that you can see: ? Any grab bars or handrails. ? First and last steps. ? Where the edge of each step is.  Use tools that help you move around (mobility aids) if they are needed. These include: ? Canes. ? Walkers. ? Scooters. ? Crutches.  Turn on the lights when you go into a dark area. Replace any light bulbs as soon as they burn  out.  Set up your furniture so you have a clear path. Avoid moving your furniture around.  If any of your floors are  uneven, fix them.  If there are any pets around you, be aware of where they are.  Review your medicines with your doctor. Some medicines can make you feel dizzy. This can increase your chance of falling. Ask your doctor what other things that you can do to help prevent falls. This information is not intended to replace advice given to you by your health care provider. Make sure you discuss any questions you have with your health care provider. Document Released: 02/06/2009 Document Revised: 09/18/2015 Document Reviewed: 05/17/2014 Elsevier Interactive Patient Education  2018 [place] Maintenance, Male A healthy lifestyle and preventive care is important for your health and wellness. Ask your health care provider about what schedule of regular examinations is right for you. What should I know about weight and diet? Eat a Healthy Diet  Eat plenty of vegetables, fruits, whole grains, low-fat dairy products, and lean protein.  Do not eat a lot of foods high in solid fats, added sugars, or salt.  Maintain a Healthy Weight Regular exercise can help you achieve or maintain a healthy weight. You should:  Do at least 150 minutes of exercise each week. The exercise should increase your heart rate and make you sweat (moderate-intensity exercise).  Do strength-training exercises at least twice a week.  Watch Your Levels of Cholesterol and Blood Lipids  Have your blood tested for lipids and cholesterol every 5 years starting at 82 years of age. If you are at high risk for heart disease, you should start having your blood tested when you are 82 years old. You may need to have your cholesterol levels checked more often if: ? Your lipid or cholesterol levels are high. ? You are older than 82 years of age. ? You are at high risk for heart disease.  What should I  know about cancer screening? Many types of cancers can be detected early and may often be prevented. Lung Cancer  You should be screened every year for lung cancer if: ? You are a current smoker who has smoked for at least 30 years. ? You are a former smoker who has quit within the past 15 years.  Talk to your health care provider about your screening options, when you should start screening, and how often you should be screened.  Colorectal Cancer  Routine colorectal cancer screening usually begins at 82 years of age and should be repeated every 5-10 years until you are 82 years old. You may need to be screened more often if early forms of precancerous polyps or small growths are found. Your health care provider may recommend screening at an earlier age if you have risk factors for colon cancer.  Your health care provider may recommend using home test kits to check for hidden blood in the stool.  A small camera at the end of a tube can be used to examine your colon (sigmoidoscopy or colonoscopy). This checks for the earliest forms of colorectal cancer.  Prostate and Testicular Cancer  Depending on your age and overall health, your health care provider may do certain tests to screen for prostate and testicular cancer.  Talk to your health care provider about any symptoms or concerns you have about testicular or prostate cancer.  Skin Cancer  Check your skin from head to toe regularly.  Tell your health care provider about any new moles or changes in moles, especially if: ? There is a change in a mole's size, shape, or color. ? You have  a mole that is larger than a pencil eraser.  Always use sunscreen. Apply sunscreen liberally and repeat throughout the day.  Protect yourself by wearing long sleeves, pants, a wide-brimmed hat, and sunglasses when outside.  What should I know about heart disease, diabetes, and high blood pressure?  If you are 94-61 years of age, have your blood  pressure checked every 3-5 years. If you are 65 years of age or older, have your blood pressure checked every year. You should have your blood pressure measured twice-once when you are at a hospital or clinic, and once when you are not at a hospital or clinic. Record the average of the two measurements. To check your blood pressure when you are not at a hospital or clinic, you can use: ? An automated blood pressure machine at a pharmacy. ? A home blood pressure monitor.  Talk to your health care provider about your target blood pressure.  If you are between 36-35 years old, ask your health care provider if you should take aspirin to prevent heart disease.  Have regular diabetes screenings by checking your fasting blood sugar level. ? If you are at a normal weight and have a low risk for diabetes, have this test once every three years after the age of 23. ? If you are overweight and have a high risk for diabetes, consider being tested at a younger age or more often.  A one-time screening for abdominal aortic aneurysm (AAA) by ultrasound is recommended for men aged 6-75 years who are current or former smokers. What should I know about preventing infection? Hepatitis B If you have a higher risk for hepatitis B, you should be screened for this virus. Talk with your health care provider to find out if you are at risk for hepatitis B infection. Hepatitis C Blood testing is recommended for:  Everyone born from [birth year] through 1963.  Anyone with known risk factors for hepatitis C.  Sexually Transmitted Diseases (STDs)  You should be screened each year for STDs including gonorrhea and chlamydia if: ? You are sexually active and are younger than 82 years of age. ? You are older than 82 years of age and your health care provider tells you that you are at risk for this type of infection. ? Your sexual activity has changed since you were last screened and you are at an increased risk for chlamydia or  gonorrhea. Ask your health care provider if you are at risk.  Talk with your health care provider about whether you are at high risk of being infected with HIV. Your health care provider may recommend a prescription medicine to help prevent HIV infection.  What else can I do?  Schedule regular health, dental, and eye exams.  Stay current with your vaccines (immunizations).  Do not use any tobacco products, such as cigarettes, chewing tobacco, and e-cigarettes. If you need help quitting, ask your health care provider.  Limit alcohol intake to no more than 2 drinks per day. One drink equals 12 ounces of beer, 5 ounces of wine, or 1 ounces of hard liquor.  Do not use street drugs.  Do not share needles.  Ask your health care provider for help if you need support or information about quitting drugs.  Tell your health care provider if you often feel depressed.  Tell your health care provider if you have ever been abused or do not feel safe at home. This information is not intended to replace advice given to you  by your health care provider. Make sure you discuss any questions you have with your health care provider. Document Released: 10/09/2007 Document Revised: 12/10/2015 Document Reviewed: 01/14/2015 Elsevier Interactive Patient Education  [employer].

## 2018-03-13 ENCOUNTER — Telehealth: Payer: Self-pay | Admitting: Family Medicine

## 2018-03-13 DIAGNOSIS — M25552 Pain in left hip: Secondary | ICD-10-CM | POA: Diagnosis not present

## 2018-03-13 DIAGNOSIS — M6281 Muscle weakness (generalized): Secondary | ICD-10-CM | POA: Diagnosis not present

## 2018-03-13 DIAGNOSIS — R296 Repeated falls: Secondary | ICD-10-CM | POA: Diagnosis not present

## 2018-03-13 DIAGNOSIS — R2681 Unsteadiness on feet: Secondary | ICD-10-CM | POA: Diagnosis not present

## 2018-03-13 DIAGNOSIS — R531 Weakness: Secondary | ICD-10-CM | POA: Diagnosis not present

## 2018-03-13 NOTE — Telephone Encounter (Signed)
Copied from [place] [id number]. Topic: Quick Communication - Home Health Verbal Orders >> Mar 13, 2018 12:06 PM Bea Graff, NT wrote: Caller/Agency: [facility] Number: [phone] Requesting OT/PT/Skilled Nursing/Social Work: OT Frequency: 3 times a week, at least 12 times in 4 weeks

## 2018-03-13 NOTE — Telephone Encounter (Signed)
Dr. Sarajane Jews please advise on orders for OT needed for this pt.  Thanks

## 2018-03-15 ENCOUNTER — Other Ambulatory Visit: Payer: Self-pay

## 2018-03-16 NOTE — Telephone Encounter (Signed)
Called and spoke with [place] and she stated that they did receive the order back that was signed now they are waiting on the evaluation to be done.

## 2018-03-16 NOTE — Telephone Encounter (Signed)
Please okay these orders  ?

## 2018-03-17 DIAGNOSIS — R296 Repeated falls: Secondary | ICD-10-CM | POA: Diagnosis not present

## 2018-03-17 DIAGNOSIS — R531 Weakness: Secondary | ICD-10-CM | POA: Diagnosis not present

## 2018-03-17 DIAGNOSIS — R2681 Unsteadiness on feet: Secondary | ICD-10-CM | POA: Diagnosis not present

## 2018-03-17 DIAGNOSIS — M6281 Muscle weakness (generalized): Secondary | ICD-10-CM | POA: Diagnosis not present

## 2018-03-17 DIAGNOSIS — M25552 Pain in left hip: Secondary | ICD-10-CM | POA: Diagnosis not present

## 2018-03-20 DIAGNOSIS — R2681 Unsteadiness on feet: Secondary | ICD-10-CM | POA: Diagnosis not present

## 2018-03-20 DIAGNOSIS — R531 Weakness: Secondary | ICD-10-CM | POA: Diagnosis not present

## 2018-03-20 DIAGNOSIS — M25552 Pain in left hip: Secondary | ICD-10-CM | POA: Diagnosis not present

## 2018-03-20 DIAGNOSIS — R296 Repeated falls: Secondary | ICD-10-CM | POA: Diagnosis not present

## 2018-03-20 DIAGNOSIS — M6281 Muscle weakness (generalized): Secondary | ICD-10-CM | POA: Diagnosis not present

## 2018-03-21 ENCOUNTER — Ambulatory Visit (INDEPENDENT_AMBULATORY_CARE_PROVIDER_SITE_OTHER): Payer: Medicare Other | Admitting: Family Medicine

## 2018-03-21 ENCOUNTER — Encounter: Payer: Self-pay | Admitting: Family Medicine

## 2018-03-21 VITALS — BP 120/70 | HR 68 | Temp 98.6°F | Wt 158.1 lb

## 2018-03-21 DIAGNOSIS — M1990 Unspecified osteoarthritis, unspecified site: Secondary | ICD-10-CM | POA: Insufficient documentation

## 2018-03-21 DIAGNOSIS — M159 Polyosteoarthritis, unspecified: Secondary | ICD-10-CM

## 2018-03-21 DIAGNOSIS — M25552 Pain in left hip: Secondary | ICD-10-CM | POA: Diagnosis not present

## 2018-03-21 DIAGNOSIS — R2681 Unsteadiness on feet: Secondary | ICD-10-CM | POA: Diagnosis not present

## 2018-03-21 DIAGNOSIS — M6281 Muscle weakness (generalized): Secondary | ICD-10-CM | POA: Diagnosis not present

## 2018-03-21 DIAGNOSIS — R531 Weakness: Secondary | ICD-10-CM | POA: Diagnosis not present

## 2018-03-21 DIAGNOSIS — M150 Primary generalized (osteo)arthritis: Secondary | ICD-10-CM | POA: Diagnosis not present

## 2018-03-21 DIAGNOSIS — R296 Repeated falls: Secondary | ICD-10-CM | POA: Diagnosis not present

## 2018-03-21 MED ORDER — MELOXICAM 15 MG PO TABS: 7.5000 mg | ORAL_TABLET | Freq: Every day | ORAL | 11 refills | Status: DC

## 2018-03-21 NOTE — Progress Notes (Signed)
   Subjective:    Patient ID: Gabriel [name], male    DOB: [birth year], 84 y.o.   MRN: [id number]  HPI Here to follow up on arthritis pains. The Meloxicam helps quite a bit and he needs refills. He uses his walker all the time now.    Review of Systems  Constitutional: Negative.   Respiratory: Negative.   Cardiovascular: Negative.   Musculoskeletal: Positive for arthralgias and back pain.       Objective:   Physical Exam  Constitutional: He is oriented to person, place, and time. He appears well-developed and well-nourished.  Cardiovascular: Normal rate, regular rhythm, normal heart sounds and intact distal pulses.  Pulmonary/Chest: Effort normal and breath sounds normal.  Neurological: He is alert and oriented to person, place, and time.          Assessment & Plan:  Osteoarthritis, stable. Refilled Meloxicam.  Alysia Penna, MD

## 2018-03-22 DIAGNOSIS — R296 Repeated falls: Secondary | ICD-10-CM | POA: Diagnosis not present

## 2018-03-22 DIAGNOSIS — R2681 Unsteadiness on feet: Secondary | ICD-10-CM | POA: Diagnosis not present

## 2018-03-22 DIAGNOSIS — R531 Weakness: Secondary | ICD-10-CM | POA: Diagnosis not present

## 2018-03-22 DIAGNOSIS — M6281 Muscle weakness (generalized): Secondary | ICD-10-CM | POA: Diagnosis not present

## 2018-03-22 DIAGNOSIS — M25552 Pain in left hip: Secondary | ICD-10-CM | POA: Diagnosis not present

## 2018-03-27 DIAGNOSIS — M25552 Pain in left hip: Secondary | ICD-10-CM | POA: Diagnosis not present

## 2018-03-27 DIAGNOSIS — R531 Weakness: Secondary | ICD-10-CM | POA: Diagnosis not present

## 2018-03-27 DIAGNOSIS — R2681 Unsteadiness on feet: Secondary | ICD-10-CM | POA: Diagnosis not present

## 2018-03-27 DIAGNOSIS — R296 Repeated falls: Secondary | ICD-10-CM | POA: Diagnosis not present

## 2018-03-27 DIAGNOSIS — M6281 Muscle weakness (generalized): Secondary | ICD-10-CM | POA: Diagnosis not present

## 2018-03-28 DIAGNOSIS — M25552 Pain in left hip: Secondary | ICD-10-CM | POA: Diagnosis not present

## 2018-03-28 DIAGNOSIS — M6281 Muscle weakness (generalized): Secondary | ICD-10-CM | POA: Diagnosis not present

## 2018-03-28 DIAGNOSIS — R296 Repeated falls: Secondary | ICD-10-CM | POA: Diagnosis not present

## 2018-03-28 DIAGNOSIS — R531 Weakness: Secondary | ICD-10-CM | POA: Diagnosis not present

## 2018-03-28 DIAGNOSIS — R2681 Unsteadiness on feet: Secondary | ICD-10-CM | POA: Diagnosis not present

## 2018-03-30 DIAGNOSIS — R2681 Unsteadiness on feet: Secondary | ICD-10-CM | POA: Diagnosis not present

## 2018-03-30 DIAGNOSIS — R531 Weakness: Secondary | ICD-10-CM | POA: Diagnosis not present

## 2018-03-30 DIAGNOSIS — R296 Repeated falls: Secondary | ICD-10-CM | POA: Diagnosis not present

## 2018-03-30 DIAGNOSIS — M6281 Muscle weakness (generalized): Secondary | ICD-10-CM | POA: Diagnosis not present

## 2018-03-30 DIAGNOSIS — M25552 Pain in left hip: Secondary | ICD-10-CM | POA: Diagnosis not present

## 2018-04-03 DIAGNOSIS — R2681 Unsteadiness on feet: Secondary | ICD-10-CM | POA: Diagnosis not present

## 2018-04-03 DIAGNOSIS — M25552 Pain in left hip: Secondary | ICD-10-CM | POA: Diagnosis not present

## 2018-04-03 DIAGNOSIS — R296 Repeated falls: Secondary | ICD-10-CM | POA: Diagnosis not present

## 2018-04-03 DIAGNOSIS — R531 Weakness: Secondary | ICD-10-CM | POA: Diagnosis not present

## 2018-04-03 DIAGNOSIS — M6281 Muscle weakness (generalized): Secondary | ICD-10-CM | POA: Diagnosis not present

## 2018-04-05 DIAGNOSIS — R2681 Unsteadiness on feet: Secondary | ICD-10-CM | POA: Diagnosis not present

## 2018-04-05 DIAGNOSIS — R296 Repeated falls: Secondary | ICD-10-CM | POA: Diagnosis not present

## 2018-04-05 DIAGNOSIS — M6281 Muscle weakness (generalized): Secondary | ICD-10-CM | POA: Diagnosis not present

## 2018-04-05 DIAGNOSIS — M25552 Pain in left hip: Secondary | ICD-10-CM | POA: Diagnosis not present

## 2018-04-05 DIAGNOSIS — R531 Weakness: Secondary | ICD-10-CM | POA: Diagnosis not present

## 2018-04-26 ENCOUNTER — Other Ambulatory Visit: Payer: Self-pay | Admitting: Family Medicine

## 2018-04-27 ENCOUNTER — Other Ambulatory Visit: Payer: Self-pay | Admitting: Family Medicine

## 2018-04-27 ENCOUNTER — Telehealth: Payer: Self-pay

## 2018-04-27 MED ORDER — GABAPENTIN 300 MG PO CAPS: 300.0000 mg | ORAL_CAPSULE | Freq: Two times a day (BID) | ORAL | 2 refills | Status: DC

## 2018-04-27 NOTE — Telephone Encounter (Signed)
Copied from [place] [id number]. Topic: Quick Communication - Rx Refill/Question >> Apr 27, 2018  1:39 PM Windy Kalata wrote: Medication: gabapentin (NEURONTIN) 300 MG capsule     Has the patient contacted their pharmacy? Yes.   (Agent: If no, request that the patient contact the pharmacy for the refill.) (Agent: If yes, when and what did the pharmacy advise?) Call office for refill request [place], [place] [phone] (Phone) [phone] (Fax)   Preferred Pharmacy (with phone number or street name):   Agent: Please be advised that RX refills may take up to 3 business days. We ask that you follow-up with your pharmacy.

## 2018-04-27 NOTE — Telephone Encounter (Signed)
Copied from [place] [id number]. Topic: General - Other >> Apr 27, 2018  2:52 PM Keene Breath wrote: Reason for CRM: Patient called to speak with nurse regarding his medication for gabapentin (NEURONTIN) 300 MG capsule.  Patient is not sure how many he should take a day.  Please advise and call patient back as soon as possible.  CB# [phone]

## 2018-04-27 NOTE — Telephone Encounter (Signed)
Dr. Fry please advise. Thanks  

## 2018-04-28 NOTE — Telephone Encounter (Signed)
Rx already done

## 2018-04-28 NOTE — Telephone Encounter (Signed)
Patient has been taking gabapentin once daily but would like to try twice daily.  Okay to send in for twice daily?

## 2018-04-28 NOTE — Telephone Encounter (Signed)
He should take this BID

## 2018-04-28 NOTE — Telephone Encounter (Signed)
I have called and lmom for the pt to call me back to discuss his directions for the gabentin.

## 2018-05-20 ENCOUNTER — Other Ambulatory Visit: Payer: Self-pay | Admitting: Family Medicine

## 2018-05-20 DIAGNOSIS — K219 Gastro-esophageal reflux disease without esophagitis: Secondary | ICD-10-CM

## 2018-05-20 DIAGNOSIS — I10 Essential (primary) hypertension: Secondary | ICD-10-CM

## 2018-08-19 ENCOUNTER — Other Ambulatory Visit: Payer: Self-pay | Admitting: Family Medicine

## 2018-08-19 DIAGNOSIS — I10 Essential (primary) hypertension: Secondary | ICD-10-CM

## 2018-08-19 DIAGNOSIS — K219 Gastro-esophageal reflux disease without esophagitis: Secondary | ICD-10-CM

## 2018-08-24 NOTE — Telephone Encounter (Signed)
Patient is calling in checking on the status of his medication requests. States he has enough for this week and a bit into next. Call back is [phone].

## 2018-08-28 NOTE — Telephone Encounter (Signed)
Patient calling in checking on status of refill. Patient states he has about 1 week left.

## 2018-09-29 ENCOUNTER — Encounter: Payer: Self-pay | Admitting: Family Medicine

## 2018-09-29 ENCOUNTER — Other Ambulatory Visit: Payer: Self-pay

## 2018-09-29 ENCOUNTER — Ambulatory Visit (INDEPENDENT_AMBULATORY_CARE_PROVIDER_SITE_OTHER): Payer: Medicare Other | Admitting: Family Medicine

## 2018-09-29 VITALS — BP 134/62 | HR 69 | Temp 97.7°F | Wt 155.0 lb

## 2018-09-29 DIAGNOSIS — I10 Essential (primary) hypertension: Secondary | ICD-10-CM

## 2018-09-29 DIAGNOSIS — N401 Enlarged prostate with lower urinary tract symptoms: Secondary | ICD-10-CM

## 2018-09-29 DIAGNOSIS — G609 Hereditary and idiopathic neuropathy, unspecified: Secondary | ICD-10-CM

## 2018-09-29 DIAGNOSIS — M150 Primary generalized (osteo)arthritis: Secondary | ICD-10-CM

## 2018-09-29 DIAGNOSIS — N138 Other obstructive and reflux uropathy: Secondary | ICD-10-CM | POA: Diagnosis not present

## 2018-09-29 DIAGNOSIS — K219 Gastro-esophageal reflux disease without esophagitis: Secondary | ICD-10-CM

## 2018-09-29 DIAGNOSIS — M159 Polyosteoarthritis, unspecified: Secondary | ICD-10-CM

## 2018-09-29 LAB — HEPATIC FUNCTION PANEL
ALT: 25 U/L (ref 0–53)
AST: 28 U/L (ref 0–37)
Albumin: 4.2 g/dL (ref 3.5–5.2)
Alkaline Phosphatase: 82 U/L (ref 39–117)
Bilirubin, Direct: 0.4 mg/dL — ABNORMAL HIGH (ref 0.0–0.3)
Total Bilirubin: 2 mg/dL — ABNORMAL HIGH (ref 0.2–1.2)
Total Protein: 6.7 g/dL (ref 6.0–8.3)

## 2018-09-29 LAB — CBC WITH DIFFERENTIAL/PLATELET
Basophils Absolute: 0.1 10*3/uL (ref 0.0–0.1)
Basophils Relative: 1.5 % (ref 0.0–3.0)
Eosinophils Absolute: 0.2 10*3/uL (ref 0.0–0.7)
Eosinophils Relative: 2.3 % (ref 0.0–5.0)
HCT: 45.7 % (ref 39.0–52.0)
Hemoglobin: 16.2 g/dL (ref 13.0–17.0)
Lymphocytes Relative: 29.9 % (ref 12.0–46.0)
Lymphs Abs: 2.3 10*3/uL (ref 0.7–4.0)
MCHC: 35.5 g/dL (ref 30.0–36.0)
MCV: 90.9 fl (ref 78.0–100.0)
Monocytes Absolute: 0.7 10*3/uL (ref 0.1–1.0)
Monocytes Relative: 9.6 % (ref 3.0–12.0)
Neutro Abs: 4.3 10*3/uL (ref 1.4–7.7)
Neutrophils Relative %: 56.7 % (ref 43.0–77.0)
Platelets: 285 10*3/uL (ref 150.0–400.0)
RBC: 5.02 Mil/uL (ref 4.22–5.81)
RDW: 13.3 % (ref 11.5–15.5)
WBC: 7.6 10*3/uL (ref 4.0–10.5)

## 2018-09-29 LAB — LIPID PANEL
Cholesterol: 158 mg/dL (ref 0–200)
HDL: 37.3 mg/dL — ABNORMAL LOW (ref >39.00)
LDL Cholesterol: 86 mg/dL (ref 0–99)
NonHDL: 120.3
Total CHOL/HDL Ratio: 4
Triglycerides: 174 mg/dL — ABNORMAL HIGH (ref 0.0–149.0)
VLDL: 34.8 mg/dL (ref 0.0–40.0)

## 2018-09-29 LAB — BASIC METABOLIC PANEL
BUN: 13 mg/dL (ref 6–23)
CO2: 29 mEq/L (ref 19–32)
Calcium: 9.8 mg/dL (ref 8.4–10.5)
Chloride: 95 mEq/L — ABNORMAL LOW (ref 96–112)
Creatinine, Ser: 0.64 mg/dL (ref 0.40–1.50)
GFR: 116.97 mL/min (ref >60.00)
Glucose, Bld: 95 mg/dL (ref 70–99)
Potassium: 4 mEq/L (ref 3.5–5.1)
Sodium: 134 mEq/L — ABNORMAL LOW (ref 135–145)

## 2018-09-29 LAB — TSH: TSH: 2.82 u[IU]/mL (ref 0.35–4.50)

## 2018-09-29 NOTE — Progress Notes (Signed)
   Subjective:    Patient ID: Gabriel [name], male    DOB: [birth year], 85 y.o.   MRN: [id number]  HPI Here to follow up on issues. He feels well. He remains active. He is here with his daughter. His neuropathy is stable. His BP is stable. Appetite and sleep are intact.    Review of Systems  Constitutional: Negative.   HENT: Negative.   Eyes: Negative.   Respiratory: Negative.   Cardiovascular: Negative.   Gastrointestinal: Negative.   Genitourinary: Negative.   Musculoskeletal: Negative.   Skin: Negative.   Neurological: Negative.   Psychiatric/Behavioral: Negative.        Objective:   Physical Exam Constitutional:      General: He is not in acute distress.    Appearance: He is well-developed. He is not diaphoretic.  HENT:     Head: Normocephalic and atraumatic.     Right Ear: External ear normal.     Left Ear: External ear normal.     Nose: Nose normal.     Mouth/Throat:     Pharynx: No oropharyngeal exudate.  Eyes:     General: No scleral icterus.       Right eye: No discharge.        Left eye: No discharge.     Conjunctiva/sclera: Conjunctivae normal.     Pupils: Pupils are equal, round, and reactive to light.  Neck:     Musculoskeletal: Neck supple.     Thyroid: No thyromegaly.     Vascular: No JVD.     Trachea: No tracheal deviation.  Cardiovascular:     Rate and Rhythm: Normal rate and regular rhythm.     Heart sounds: Normal heart sounds. No murmur. No friction rub. No gallop.   Pulmonary:     Effort: Pulmonary effort is normal. No respiratory distress.     Breath sounds: Normal breath sounds. No wheezing or rales.  Chest:     Chest wall: No tenderness.  Abdominal:     General: Bowel sounds are normal. There is no distension.     Palpations: Abdomen is soft. There is no mass.     Tenderness: There is no abdominal tenderness. There is no guarding or rebound.  Genitourinary:    Penis: No tenderness.   Musculoskeletal: Normal range of motion.      General: No tenderness.  Lymphadenopathy:     Cervical: No cervical adenopathy.  Skin:    General: Skin is warm and dry.     Coloration: Skin is not pale.     Findings: No erythema or rash.  Neurological:     Mental Status: He is alert and oriented to person, place, and time.     Cranial Nerves: No cranial nerve deficit.     Motor: No abnormal muscle tone.     Coordination: Coordination normal.     Deep Tendon Reflexes: Reflexes are normal and symmetric. Reflexes normal.  Psychiatric:        Behavior: Behavior normal.        Thought Content: Thought content normal.        Judgment: Judgment normal.           Assessment & Plan:  He is doing well. HTN and GERD and neuropathy are well controlled. Get fasting labs to check lipids, etc.  Alysia Penna, MD

## 2018-10-05 DIAGNOSIS — Z961 Presence of intraocular lens: Secondary | ICD-10-CM | POA: Diagnosis not present

## 2018-10-05 DIAGNOSIS — D3131 Benign neoplasm of right choroid: Secondary | ICD-10-CM | POA: Diagnosis not present

## 2018-10-05 DIAGNOSIS — H353131 Nonexudative age-related macular degeneration, bilateral, early dry stage: Secondary | ICD-10-CM | POA: Diagnosis not present

## 2018-10-05 DIAGNOSIS — H40013 Open angle with borderline findings, low risk, bilateral: Secondary | ICD-10-CM | POA: Diagnosis not present

## 2018-10-05 DIAGNOSIS — H04123 Dry eye syndrome of bilateral lacrimal glands: Secondary | ICD-10-CM | POA: Diagnosis not present

## 2018-11-11 ENCOUNTER — Other Ambulatory Visit: Payer: Self-pay | Admitting: Family Medicine

## 2018-11-11 DIAGNOSIS — K219 Gastro-esophageal reflux disease without esophagitis: Secondary | ICD-10-CM

## 2018-11-11 DIAGNOSIS — I10 Essential (primary) hypertension: Secondary | ICD-10-CM

## 2018-11-18 ENCOUNTER — Other Ambulatory Visit: Payer: Self-pay | Admitting: Family Medicine

## 2018-11-18 DIAGNOSIS — K219 Gastro-esophageal reflux disease without esophagitis: Secondary | ICD-10-CM

## 2018-11-18 DIAGNOSIS — I10 Essential (primary) hypertension: Secondary | ICD-10-CM

## 2018-11-27 DIAGNOSIS — H353131 Nonexudative age-related macular degeneration, bilateral, early dry stage: Secondary | ICD-10-CM | POA: Diagnosis not present

## 2018-11-27 DIAGNOSIS — Z961 Presence of intraocular lens: Secondary | ICD-10-CM | POA: Diagnosis not present

## 2018-11-27 DIAGNOSIS — D3131 Benign neoplasm of right choroid: Secondary | ICD-10-CM | POA: Diagnosis not present

## 2018-11-27 DIAGNOSIS — H04123 Dry eye syndrome of bilateral lacrimal glands: Secondary | ICD-10-CM | POA: Diagnosis not present

## 2018-11-27 DIAGNOSIS — H10022 Other mucopurulent conjunctivitis, left eye: Secondary | ICD-10-CM | POA: Diagnosis not present

## 2018-11-27 DIAGNOSIS — H40013 Open angle with borderline findings, low risk, bilateral: Secondary | ICD-10-CM | POA: Diagnosis not present

## 2018-11-30 DIAGNOSIS — H10022 Other mucopurulent conjunctivitis, left eye: Secondary | ICD-10-CM | POA: Diagnosis not present

## 2018-12-09 ENCOUNTER — Other Ambulatory Visit: Payer: Self-pay | Admitting: Family Medicine

## 2018-12-28 DIAGNOSIS — H0102A Squamous blepharitis right eye, upper and lower eyelids: Secondary | ICD-10-CM | POA: Diagnosis not present

## 2018-12-28 DIAGNOSIS — H10022 Other mucopurulent conjunctivitis, left eye: Secondary | ICD-10-CM | POA: Diagnosis not present

## 2018-12-28 DIAGNOSIS — H0102B Squamous blepharitis left eye, upper and lower eyelids: Secondary | ICD-10-CM | POA: Diagnosis not present

## 2019-01-08 DIAGNOSIS — Z23 Encounter for immunization: Secondary | ICD-10-CM | POA: Diagnosis not present

## 2019-01-13 ENCOUNTER — Other Ambulatory Visit: Payer: Self-pay | Admitting: Family Medicine

## 2019-02-10 ENCOUNTER — Other Ambulatory Visit: Payer: Self-pay | Admitting: Family Medicine

## 2019-03-08 ENCOUNTER — Telehealth: Payer: Self-pay | Admitting: Family Medicine

## 2019-03-08 ENCOUNTER — Other Ambulatory Visit: Payer: Self-pay

## 2019-03-08 NOTE — Telephone Encounter (Signed)
Copied from [place] [id number]. Topic: General - Other >> Mar 08, 2019  3:34 PM Keene Breath wrote: Reason for CRM: Patient called to ask the nurse to call him regarding his medication, gabapentin (NEURONTIN) 300 MG capsule.  Patient would like to know if he can take it 1x daily.  CB# [phone]

## 2019-03-09 ENCOUNTER — Other Ambulatory Visit: Payer: Self-pay

## 2019-03-09 MED ORDER — GABAPENTIN 300 MG PO CAPS: 300.0000 mg | ORAL_CAPSULE | Freq: Every day | ORAL | 1 refills | Status: DC

## 2019-03-09 NOTE — Telephone Encounter (Signed)
Patient notified of update  and verbalized understanding. 

## 2019-03-09 NOTE — Telephone Encounter (Signed)
Yes he can take only one daily

## 2019-03-09 NOTE — Telephone Encounter (Signed)
Okay for pt to take 1 time daily. Pt is currently taking 1 tab bid daily.

## 2019-03-09 NOTE — Telephone Encounter (Signed)
Pt called back to check to see if he will hear something today ?

## 2019-04-04 DIAGNOSIS — H04413 Chronic dacryocystitis of bilateral lacrimal passages: Secondary | ICD-10-CM | POA: Diagnosis not present

## 2019-04-04 DIAGNOSIS — H02055 Trichiasis without entropian left lower eyelid: Secondary | ICD-10-CM | POA: Diagnosis not present

## 2019-04-04 DIAGNOSIS — H0102A Squamous blepharitis right eye, upper and lower eyelids: Secondary | ICD-10-CM | POA: Diagnosis not present

## 2019-04-04 DIAGNOSIS — H0102B Squamous blepharitis left eye, upper and lower eyelids: Secondary | ICD-10-CM | POA: Diagnosis not present

## 2019-04-12 ENCOUNTER — Ambulatory Visit: Payer: Self-pay | Admitting: *Deleted

## 2019-04-12 NOTE — Telephone Encounter (Signed)
Message Routed to PCP CMA 

## 2019-04-12 NOTE — Telephone Encounter (Signed)
I returned his call.   It was difficult to triage him as he was vague with some of his conversation.   I had to keep asking him to clarify what he meant in regards to his dose of gabapentin.  See triage notes.    I tried to summarize what he told me.  He said about 6 weeks ago he called in wanting to know if Dr. Sarajane Jews would decrease his gabapentin to 1 pill a day.   Dr. Sarajane Jews said yes.  Now he is wanting to go back to taking 2 pills a day because he seemed to be doing better on 2 pills a day. He was started on it for neuropathy around his mouth.   "  "I feel cold all the time".   "The halls here at [facility] seem cold to me".    "I keep the heat up in my room".    "My family tells me it's because I don't move around very much".     He then went on to tell me he is constipated.  I let him know lack of movement and medications including gabapentin can contribute to constipation.   I encouraged him to drink more fluids.   He mentioned he was using Miralax for the last week and a half and realized after reading the directions he wasn't taking it right.    His daughter helped him and showed him how to dose it and take it.   She is bringing me some more Miralax today.   I know now to take it every day and how to measure out the cap full.  I let him know someone would call him back in regards to increasing his gabapentin back to 2 pills a day after checking with Dr. Sarajane Jews.  He was agreeable to this.    Reason for Disposition . [1] Caller requesting a NON-URGENT new prescription or refill AND [2] triager unable to refill per unit policy    He was on 2 gabapentin pills a day.   He then called and had it reduced to 1 pill a day about 6 wks ago.   Now he wants to increase it back to 2 gabapentin pills a day.  Answer Assessment - Initial Assessment Questions 1.   NAME of MEDICATION: "What medicine are you calling about?"     I was on 2 gabapentin a day.    I called and asked if I could take 1 gabapentin a day.    He said yes.  This was 4-6 weeks ago the dose was decreased.  I'm on it for neuropathy. Nothing was wrong with me taking 2 gabapentin.   "I just called and asked if I could take 1 gabapentin a day and Dr. Sarajane Jews said yes".    "I don't know why I called and had the gabapentin reduced to be honest".    "It was helping me when I started it 4-5 years ago"    "I think I made a mistake by cutting it back to 1 pill a day.   I've been on 1 a day for about 6 weeks.    I think I need to go back to 2 pills a day.          2.   QUESTION: "What is your question?"     I can't seem to get warm and I feel tingly all over.    3.   PRESCRIBING HCP: "Who prescribed it?" Reason: if  prescribed by specialist, call should be referred to that group.     Dr. Sarajane Jews I'm taking Mirilax for constipation.    I've been having small BMs.   I usually go after breakfast every morning.   I've not gone this morning.    I've not been taking the Miralax as  I was supposed to.      I've been on the Miralax for a week and a half but I've not taken it every day.    4. SYMPTOMS: "Do you have any symptoms?"     I feel cold.   I can adjust the temperature in my room.   I've been cold every winter.   I'm not very active.   I use a scooter to get around here at [facility].   The halls here seem cold to me.    My family keeps telling me to move around.    I take a 81 mg aspirin daily.    5. SEVERITY: If symptoms are present, ask "Are they mild, moderate or severe?"      4 years ago it started around my lips and mouth the neuropathy.  I'm not dizzy.   Last night I was apprehensive and I felt the tingling all over my body.   I was talking with my daughter when it happened.    "This pandemic is awful".    6.  PREGNANCY:  "Is there any chance that you are pregnant?" "When was your last menstrual period?"     N/A  Protocols used: MEDICATION QUESTION CALL-A-AH

## 2019-04-13 ENCOUNTER — Other Ambulatory Visit: Payer: Self-pay

## 2019-04-13 MED ORDER — GABAPENTIN 300 MG PO CAPS: 300.0000 mg | ORAL_CAPSULE | Freq: Two times a day (BID) | ORAL | 1 refills | Status: DC

## 2019-04-13 NOTE — Telephone Encounter (Signed)
I increased the Gabapentin back to twice a day and sent in a new rx

## 2019-04-13 NOTE — Telephone Encounter (Signed)
Patient is aware 

## 2019-04-13 NOTE — Addendum Note (Signed)
Addended by: Alysia Penna A on: 04/13/2019 01:01 PM   Modules accepted: Orders

## 2019-04-30 DIAGNOSIS — Z23 Encounter for immunization: Secondary | ICD-10-CM | POA: Diagnosis not present

## 2019-05-17 ENCOUNTER — Other Ambulatory Visit: Payer: Self-pay | Admitting: Family Medicine

## 2019-05-28 ENCOUNTER — Telehealth: Payer: Self-pay | Admitting: Family Medicine

## 2019-05-28 DIAGNOSIS — K219 Gastro-esophageal reflux disease without esophagitis: Secondary | ICD-10-CM

## 2019-05-28 DIAGNOSIS — Z23 Encounter for immunization: Secondary | ICD-10-CM | POA: Diagnosis not present

## 2019-05-28 DIAGNOSIS — I10 Essential (primary) hypertension: Secondary | ICD-10-CM

## 2019-05-28 MED ORDER — HYDROCHLOROTHIAZIDE 25 MG PO TABS: ORAL_TABLET | ORAL | 0 refills | Status: DC

## 2019-05-28 MED ORDER — PANTOPRAZOLE SODIUM 40 MG PO TBEC: 40.0000 mg | DELAYED_RELEASE_TABLET | Freq: Every day | ORAL | 0 refills | Status: DC

## 2019-05-28 NOTE — Telephone Encounter (Signed)
Prescriptions have been sent in. The patient is aware. Will send to Dr. Sarajane Jews as an Juluis Rainier

## 2019-05-28 NOTE — Telephone Encounter (Signed)
The patient is having surgery on 06/13/2019 and was told to stop taking two medications  aspirin 81 MG tablet meloxicam (MOBIC) 15 MG tablet  He also need two medication sent to the pharmacy  pantoprazole (PROTONIX) 40 MG tablet  hydrochlorothiazide (HYDRODIURIL) 25 MG tablet  Sent to:  [id number] [address], [place] Phone:  [phone]  Fax:  [phone]

## 2019-05-30 ENCOUNTER — Ambulatory Visit (INDEPENDENT_AMBULATORY_CARE_PROVIDER_SITE_OTHER): Payer: Medicare Other

## 2019-05-30 VITALS — Ht 68.0 in | Wt 156.5 lb

## 2019-05-30 DIAGNOSIS — Z0000 Encounter for general adult medical examination without abnormal findings: Secondary | ICD-10-CM

## 2019-05-30 NOTE — Patient Instructions (Addendum)
Gabriel [name] , Thank you for taking time to participate in your Medicare Wellness Visit. I appreciate your ongoing commitment to your health goals. Please review the following plan we discussed and let me know if I can assist you in the future.   Screening recommendations/referrals: Colorectal Screening: no longer necessary due to advanced age.  Vision and Dental Exams: Recommended annual ophthalmology exams for early detection of glaucoma and other disorders of the eye. Recommended annual dental exams for proper oral hygiene. He reports he goes to these appointments as recommended.  Diabetic Exams: Diabetic Eye Exam: N/A Diabetic Foot Exam: N/A  Vaccinations: Influenza vaccine: completed 01/08/2019. Due again fall 2021. Pneumococcal vaccine: completed 03/13/2008 & 09/23/2015. Tdap vaccine:expired on 12/19/2017; Medicare does not cover the cost of this vaccine unless you have an accident like a laceration or puncture wound. You may find this to be cheaper at the health department or a pharmacy. You may also have this done in our office.  Shingles vaccine: Please call your insurance company to determine your out of pocket expense for the Shingrix vaccine. You may receive this vaccine at your local pharmacy. This is a series of two injections that are to be given 2-6 months apart.   Advanced directives: Advance directives discussed with you today. Please bring a copy of your POA (Power of [place]) and/or Living Will to your next appointment.  Goals: Recommend to drink at least 6-8 8oz glasses of water per day.  Recommend to exercise for at least 150 minutes per week.  Recommend to remove any items from the home that may cause slips or trips.  Next appointment: Please schedule your Annual Wellness Visit with your Nurse Health Advisor in one year.  Preventive Care 76 Years and Older, Male Preventive care refers to lifestyle choices and visits with your health care provider that can promote  health and wellness. What does preventive care include?  A yearly physical exam. This is also called an annual well check.  Dental exams once or twice a year.  Routine eye exams. Ask your health care provider how often you should have your eyes checked.  Personal lifestyle choices, including:  Daily care of your teeth and gums.  Regular physical activity.  Eating a healthy diet.  Avoiding tobacco and drug use.  Limiting alcohol use.  Practicing safe sex.  Taking low doses of aspirin every day if recommended by your health care provider..  Taking vitamin and mineral supplements as recommended by your health care provider. What happens during an annual well check? The services and screenings done by your health care provider during your annual well check will depend on your age, overall health, lifestyle risk factors, and family history of disease. Counseling  Your health care provider may ask you questions about your:  Alcohol use.  Tobacco use.  Drug use.  Emotional well-being.  Home and relationship well-being.  Sexual activity.  Eating habits.  History of falls.  Memory and ability to understand (cognition).  Work and work Statistician. Screening  You may have the following tests or measurements:  Height, weight, and BMI.  Blood pressure.  Lipid and cholesterol levels. These may be checked every 5 years, or more frequently if you are over 49 years old.  Skin check.  Lung cancer screening. You may have this screening every year starting at age 54 if you have a 30-pack-year history of smoking and currently smoke or have quit within the past 15 years.  Fecal occult blood test (FOBT)  of the stool. You may have this test every year starting at age 25.  Flexible sigmoidoscopy or colonoscopy. You may have a sigmoidoscopy every 5 years or a colonoscopy every 10 years starting at age 69.  Prostate cancer screening. Recommendations will vary depending on your  family history and other risks.  Hepatitis C blood test.  Hepatitis B blood test.  Sexually transmitted disease (STD) testing.  Diabetes screening. This is done by checking your blood sugar (glucose) after you have not eaten for a while (fasting). You may have this done every 1-3 years.  Abdominal aortic aneurysm (AAA) screening. You may need this if you are a current or former smoker.  Osteoporosis. You may be screened starting at age 84 if you are at high risk. Talk with your health care provider about your test results, treatment options, and if necessary, the need for more tests. Vaccines  Your health care provider may recommend certain vaccines, such as:  Influenza vaccine. This is recommended every year.  Tetanus, diphtheria, and acellular pertussis (Tdap, Td) vaccine. You may need a Td booster every 10 years.  Zoster vaccine. You may need this after age 66.  Pneumococcal 13-valent conjugate (PCV13) vaccine. One dose is recommended after age 62.  Pneumococcal polysaccharide (PPSV23) vaccine. One dose is recommended after age 50. Talk to your health care provider about which screenings and vaccines you need and how often you need them. This information is not intended to replace advice given to you by your health care provider. Make sure you discuss any questions you have with your health care provider. Document Released: 05/09/2015 Document Revised: 12/31/2015 Document Reviewed: 02/11/2015 Elsevier Interactive Patient Education  2017 [place] Prevention in the Home Falls can cause injuries. They can happen to people of all ages. There are many things you can do to make your home safe and to help prevent falls. What can I do on the outside of my home?  Regularly fix the edges of walkways and driveways and fix any cracks.  Remove anything that might make you trip as you walk through a door, such as a raised step or threshold.  Trim any bushes or trees on the  path to your home.  Use bright outdoor lighting.  Clear any walking paths of anything that might make someone trip, such as rocks or tools.  Regularly check to see if handrails are loose or broken. Make sure that both sides of any steps have handrails.  Any raised decks and porches should have guardrails on the edges.  Have any leaves, snow, or ice cleared regularly.  Use sand or salt on walking paths during winter.  Clean up any spills in your garage right away. This includes oil or grease spills. What can I do in the bathroom?  Use night lights.  Install grab bars by the toilet and in the tub and shower. Do not use towel bars as grab bars.  Use non-skid mats or decals in the tub or shower.  If you need to sit down in the shower, use a plastic, non-slip stool.  Keep the floor dry. Clean up any water that spills on the floor as soon as it happens.  Remove soap buildup in the tub or shower regularly.  Attach bath mats securely with double-sided non-slip rug tape.  Do not have throw rugs and other things on the floor that can make you trip. What can I do in the bedroom?  Use night lights.  Make sure  that you have a light by your bed that is easy to reach.  Do not use any sheets or blankets that are too big for your bed. They should not hang down onto the floor.  Have a firm chair that has side arms. You can use this for support while you get dressed.  Do not have throw rugs and other things on the floor that can make you trip. What can I do in the kitchen?  Clean up any spills right away.  Avoid walking on wet floors.  Keep items that you use a lot in easy-to-reach places.  If you need to reach something above you, use a strong step stool that has a grab bar.  Keep electrical cords out of the way.  Do not use floor polish or wax that makes floors slippery. If you must use wax, use non-skid floor wax.  Do not have throw rugs and other things on the floor that can  make you trip. What can I do with my stairs?  Do not leave any items on the stairs.  Make sure that there are handrails on both sides of the stairs and use them. Fix handrails that are broken or loose. Make sure that handrails are as long as the stairways.  Check any carpeting to make sure that it is firmly attached to the stairs. Fix any carpet that is loose or worn.  Avoid having throw rugs at the top or bottom of the stairs. If you do have throw rugs, attach them to the floor with carpet tape.  Make sure that you have a light switch at the top of the stairs and the bottom of the stairs. If you do not have them, ask someone to add them for you. What else can I do to help prevent falls?  Wear shoes that:  Do not have high heels.  Have rubber bottoms.  Are comfortable and fit you well.  Are closed at the toe. Do not wear sandals.  If you use a stepladder:  Make sure that it is fully opened. Do not climb a closed stepladder.  Make sure that both sides of the stepladder are locked into place.  Ask someone to hold it for you, if possible.  Clearly mark and make sure that you can see:  Any grab bars or handrails.  First and last steps.  Where the edge of each step is.  Use tools that help you move around (mobility aids) if they are needed. These include:  Canes.  Walkers.  Scooters.  Crutches.  Turn on the lights when you go into a dark area. Replace any light bulbs as soon as they burn out.  Set up your furniture so you have a clear path. Avoid moving your furniture around.  If any of your floors are uneven, fix them.  If there are any pets around you, be aware of where they are.  Review your medicines with your doctor. Some medicines can make you feel dizzy. This can increase your chance of falling. Ask your doctor what other things that you can do to help prevent falls. This information is not intended to replace advice given to you by your health care  provider. Make sure you discuss any questions you have with your health care provider. Document Released: 02/06/2009 Document Revised: 09/18/2015 Document Reviewed: 05/17/2014 Elsevier Interactive Patient Education  2017 [employer].

## 2019-05-30 NOTE — Progress Notes (Addendum)
This visit is being conducted via phone call due to the COVID-19 pandemic. This patient has given me verbal consent via phone to conduct this visit, patient states they are participating from their home address. Some vital signs may be absent or patient reported.   Patient identification: identified by name, DOB, and current address.  Location provider: [facility], Office Persons participating in the virtual visit: Gabriel [name] and Franne Forts, LPN.   Subjective:   Gabriel [name] is a 86 y.o. male who presents for Medicare Annual/Subsequent preventive examination.  Gabriel [name] is doing well and living in a studio at [facility]. He is walking and doing stretches/exercises every day. He does not have any complaints today. He ambulates with a walker.  Review of Systems:  No ROS; Annual Medicare Wellness subsequent visit  Cardiac Risk Factors include: male gender;hypertension;advanced age (>23men, >88 women)     Objective:    Vitals: Ht 5\' 8"  (1.727 m)   Wt 156 lb 8 oz (71 kg)   BMI 23.80 kg/m   Body mass index is 23.8 kg/m.  Advanced Directives 01/30/2018 10/20/2016 04/12/2016 10/17/2015  Does Patient Have a Medical Advance Directive? Yes Yes Yes Yes  Type of Advance Directive - Living will Living will -  Does patient want to make changes to medical advance directive? - - No - Patient declined -  Copy of Pass [place] in Chart? - - - No - copy requested    Tobacco Social History   Tobacco Use  Smoking Status Former Smoker  . Packs/day: 15.00  . Years: 0.50  . Pack years: 7.50  . Types: Cigarettes  Smokeless Tobacco Never Used  Tobacco Comment   smoked 18 and stopped and states he stopped 30's      Counseling given: Not Answered Comment: smoked 16 and stopped and states he stopped 30's    Clinical Intake:  Pre-visit preparation completed: Yes  Pain : No/denies pain     BMI - recorded: 23.8 Nutritional Status: BMI of 19-24   Normal Nutritional Risks: Unintentional weight loss Diabetes: No  How often do you need to have someone help you when you read instructions, pamphlets, or other written materials from your doctor or pharmacy?: 1 - Never  Interpreter Needed?: No  Information entered by :: Franne Forts, LPN.  Past Medical History:  Diagnosis Date  . Arthritis   . Diverticulosis of colon   . GERD (gastroesophageal reflux disease)   . Hypertension    Past Surgical History:  Procedure Laterality Date  . cataract extaction, right  6/09   per Dr. [name]  . CATARACT EXTRACTION Left   . COLONOSCOPY  09/23/04   per Dr. [name], clear, no repeats needed   . EGD and esophageal diatation,  05-10-11   per Dr. [name]    . HERNIA REPAIR    . rt inguinal hernia     x2   Family History  Problem Relation Age of Onset  . Coronary artery disease Father   . Colon cancer Neg Hx   . Stomach cancer Neg Hx   . Esophageal cancer Neg Hx    Social History   Socioeconomic History  . Marital status: Widowed    Spouse name: Not on file  . Number of children: 3  . Years of education: Not on file  . Highest education level: Not on file  Occupational History  . Occupation: retired  Tobacco Use  . Smoking status: Former Smoker  Packs/day: 15.00    Years: 0.50    Pack years: 7.50    Types: Cigarettes  . Smokeless tobacco: Never Used  . Tobacco comment: smoked 18 and stopped and states he stopped 30's   Substance and Sexual Activity  . Alcohol use: No    Alcohol/week: 0.0 standard drinks  . Drug use: No  . Sexual activity: Never    Birth control/protection: Abstinence  Other Topics Concern  . Not on file  Social History Narrative   Lives in studio at [place]   3 children   Social Determinants of Health   Financial Resource Strain: Low Risk   . Difficulty of Paying Living Expenses: Not hard at all  Food Insecurity: No Food Insecurity  . Worried About Charity fundraiser in the  Last Year: Never true  . Ran Out of Food in the Last Year: Never true  Transportation Needs: No Transportation Needs  . Lack of Transportation (Medical): No  . Lack of Transportation (Non-Medical): No  Physical Activity: Sufficiently Active  . Days of Exercise per Week: 7 days  . Minutes of Exercise per Session: 30 min  Stress: No Stress Concern Present  . Feeling of Stress : Only a little  Social Connections: Slightly Isolated  . Frequency of Communication with Friends and Family: More than three times a week  . Frequency of Social Gatherings with Friends and Family: Twice a week  . Attends Religious Services: More than 4 times per year  . Active Member of Clubs or Organizations: Yes  . Attends Archivist Meetings: More than 4 times per year  . Marital Status: Widowed    Outpatient Encounter Medications as of 05/30/2019  Medication Sig  . aspirin 81 MG tablet Take 81 mg by mouth daily.    . Calcium-Magnesium-Vitamin D (CALCIUM MAGNESIUM PO) Take 1 tablet by mouth every morning.  . gabapentin (NEURONTIN) 300 MG capsule Take 1 capsule (300 mg total) by mouth 2 (two) times daily.  Marland Kitchen GARLIC PO Take 1 capsule by mouth daily.  . hydrochlorothiazide (HYDRODIURIL) 25 MG tablet TAKE 1 TABLET BY MOUTH ONCE DAILY IN THE MORNING  . meloxicam (MOBIC) 15 MG tablet TAKE 1/2 TABLET BY MOUTH EVERY DAY  . Multiple Vitamin (MULTIVITAMIN WITH MINERALS) TABS tablet Take 1 tablet by mouth every morning.  . multivitamin-lutein (OCUVITE-LUTEIN) CAPS capsule Take 1 capsule by mouth daily.  . pantoprazole (PROTONIX) 40 MG tablet Take 1 tablet (40 mg total) by mouth daily.  . vitamin C (ASCORBIC ACID) 500 MG tablet Take 500 mg by mouth daily. Reported on 10/27/2015   No facility-administered encounter medications on file as of 05/30/2019.    Activities of Daily Living In your present state of health, do you have any difficulty performing the following activities: 05/30/2019  Hearing? Y  Vision? N    Difficulty concentrating or making decisions? N  Walking or climbing stairs? N  Dressing or bathing? N  Doing errands, shopping? Y  Preparing Food and eating ? N  Using the Toilet? N  In the past six months, have you accidently leaked urine? Y  Do you have problems with loss of bowel control? N  Managing your Medications? N  Managing your Finances? N  Housekeeping or managing your Housekeeping? N  Some recent data might be hidden    Patient Care Team: Laurey Morale, MD as PCP - Evalina Field, MD as Consulting Physician (Ophthalmology) Zadie Rhine Clent Demark, MD as Consulting Physician (  Ophthalmology)   Assessment:   This is a routine wellness examination for Gabriel [name].  Exercise Activities and Dietary recommendations Current Exercise Habits: Home exercise routine, Type of exercise: walking;stretching, Time (Minutes): 30, Frequency (Times/Week): 7, Weekly Exercise (Minutes/Week): 210, Intensity: Mild, Exercise limited by: None identified  Goals    . Patient Stated     Keep exercising; and stay engaged in the community     . Prevent Falls (pt-stated)     Continue using walker and doing strength and balance exercises.       Fall Risk Fall Risk  05/30/2019 03/15/2018 01/30/2018 10/20/2016 10/17/2015  Falls in the past year? 0 0 No Yes Yes  Comment - Emmi Telephone Survey: data to providers prior to load - - -  Number falls in past yr: - - - 2 or more 1  Injury with Fall? - - - No -  Risk for fall due to : Medication side effect - - Impaired balance/gait;History of fall(s);Impaired mobility -  Follow up Education provided;Falls prevention discussed;Falls evaluation completed - - Falls prevention discussed Education provided   Is the patient's home free of loose throw rugs in walkways, pet beds, electrical cords, etc?   yes      Grab bars in the bathroom? yes      Handrails on the stairs?   yes      Adequate lighting?   yes  Timed Get Up and Go Performed: N/A due to telephone  visit.  Depression Screen PHQ 2/9 Scores 05/30/2019 09/29/2018 01/30/2018 10/20/2016  PHQ - 2 Score 0 0 0 0    Cognitive Function MMSE - Mini Mental State Exam 01/30/2018 10/20/2016 10/17/2015  Not completed: (No Data) - -  Orientation to time - 4 5  Orientation to Place - 5 5  Registration - 3 3  Attention/ Calculation - 5 1  Recall - 3 3  Language- name 2 objects - 2 2  Language- repeat - 1 1  Language- follow 3 step command - 3 3  Language- read & follow direction - 1 1  Write a sentence - 1 1  Copy design - 1 1  Total score - 29 26     6CIT Screen 05/30/2019  What Year? 0 points  What month? 0 points  What time? 0 points  Count back from 20 0 points  Months in reverse 0 points  Repeat phrase 0 points  Total Score 0    Immunization History  Administered Date(s) Administered  . Influenza Split 01/20/2012, 01/07/2013  . Influenza Whole 02/09/2006, 01/11/2008  . Influenza, High Dose Seasonal PF 12/30/2016, 12/27/2017, 01/08/2019  . Influenza-Unspecified 01/17/2014, 01/14/2016, 12/30/2016  . Pneumococcal Conjugate-13 09/23/2015  . Pneumococcal Polysaccharide-23 03/13/2008  . Td 12/20/2007    Qualifies for Shingles Vaccine? yes  Screening Tests Health Maintenance  Topic Date Due  . FOOT EXAM  10/03/1934  . URINE MICROALBUMIN  [birth year]03/1934  . TETANUS/TDAP  12/19/2017  . HEMOGLOBIN A1C  03/29/2018  . OPHTHALMOLOGY EXAM  10/24/2018  . INFLUENZA VACCINE  Completed  . PNA vac Low Risk Adult  Completed   Cancer Screenings: Lung: Low Dose CT Chest recommended if Age 59-80 years, 30 pack-year currently smoking OR have quit w/in 15years. Patient does not qualify. Colorectal: no longer needed due to advanced age  Additional Screenings:  Hepatitis C Screening: N/A due to advanced age   Plan:   Gabriel [name] is doing well at this time. He has received both covid vaccines. He understands that he  needs an updated Tdap and both shingrix vaccines. He understands that Medicare does not  cover the cost of these vaccines.   I have personally reviewed and noted the following in the patient's chart:   . Medical and social history . Use of alcohol, tobacco or illicit drugs  . Current medications and supplements . Functional ability and status . Nutritional status . Physical activity . Advanced directives . List of other physicians . Hospitalizations, surgeries, and ER visits in previous 12 months . Vitals . Screenings to include cognitive, depression, and falls . Referrals and appointments  In addition, I have reviewed and discussed with patient certain preventive protocols, quality metrics, and best practice recommendations. A written personalized care plan for preventive services as well as general preventive health recommendations were provided to patient.     Franne Forts, LPN  [phone]  I have read this note and agree with its contents. Alysia Penna, MD

## 2019-06-13 DIAGNOSIS — H16213 Exposure keratoconjunctivitis, bilateral: Secondary | ICD-10-CM | POA: Diagnosis not present

## 2019-06-13 DIAGNOSIS — H02139 Senile ectropion of unspecified eye, unspecified eyelid: Secondary | ICD-10-CM | POA: Diagnosis not present

## 2019-06-13 DIAGNOSIS — H02112 Cicatricial ectropion of right lower eyelid: Secondary | ICD-10-CM | POA: Diagnosis not present

## 2019-06-13 DIAGNOSIS — H02135 Senile ectropion of left lower eyelid: Secondary | ICD-10-CM | POA: Diagnosis not present

## 2019-06-13 DIAGNOSIS — H04523 Eversion of bilateral lacrimal punctum: Secondary | ICD-10-CM | POA: Diagnosis not present

## 2019-06-13 DIAGNOSIS — H02131 Senile ectropion of right upper eyelid: Secondary | ICD-10-CM | POA: Diagnosis not present

## 2019-06-13 DIAGNOSIS — H04563 Stenosis of bilateral lacrimal punctum: Secondary | ICD-10-CM | POA: Diagnosis not present

## 2019-06-13 DIAGNOSIS — H04221 Epiphora due to insufficient drainage, right lacrimal gland: Secondary | ICD-10-CM | POA: Diagnosis not present

## 2019-06-13 DIAGNOSIS — H04223 Epiphora due to insufficient drainage, bilateral lacrimal glands: Secondary | ICD-10-CM | POA: Diagnosis not present

## 2019-06-13 DIAGNOSIS — H04222 Epiphora due to insufficient drainage, left lacrimal gland: Secondary | ICD-10-CM | POA: Diagnosis not present

## 2019-06-13 DIAGNOSIS — H02134 Senile ectropion of left upper eyelid: Secondary | ICD-10-CM | POA: Diagnosis not present

## 2019-06-13 DIAGNOSIS — H02132 Senile ectropion of right lower eyelid: Secondary | ICD-10-CM | POA: Diagnosis not present

## 2019-08-18 ENCOUNTER — Other Ambulatory Visit: Payer: Self-pay | Admitting: Family Medicine

## 2019-08-18 DIAGNOSIS — K219 Gastro-esophageal reflux disease without esophagitis: Secondary | ICD-10-CM

## 2019-08-18 DIAGNOSIS — I10 Essential (primary) hypertension: Secondary | ICD-10-CM

## 2019-08-20 NOTE — Telephone Encounter (Signed)
Pt states he needs medication Refills for the following prescription:   Gabapentin  Meloxicam  Hydrochlorothiazide  Pantoprazole   [facility]  [place] Fax: [phone]   Pt states his daughter will be coming down tomorrow and would like this refilled today if possible so he doesn't have to worry about it tomorrow.

## 2019-08-21 ENCOUNTER — Other Ambulatory Visit: Payer: Self-pay | Admitting: Family Medicine

## 2019-08-21 DIAGNOSIS — K219 Gastro-esophageal reflux disease without esophagitis: Secondary | ICD-10-CM

## 2019-08-21 NOTE — Telephone Encounter (Signed)
HCTZ refilled all other medication are not due until June

## 2019-09-04 DIAGNOSIS — L853 Xerosis cutis: Secondary | ICD-10-CM | POA: Diagnosis not present

## 2019-09-04 DIAGNOSIS — L570 Actinic keratosis: Secondary | ICD-10-CM | POA: Diagnosis not present

## 2019-09-04 DIAGNOSIS — L821 Other seborrheic keratosis: Secondary | ICD-10-CM | POA: Diagnosis not present

## 2019-09-04 DIAGNOSIS — L814 Other melanin hyperpigmentation: Secondary | ICD-10-CM | POA: Diagnosis not present

## 2019-09-28 ENCOUNTER — Other Ambulatory Visit: Payer: Self-pay

## 2019-10-01 ENCOUNTER — Other Ambulatory Visit: Payer: Self-pay

## 2019-10-01 ENCOUNTER — Encounter: Payer: Self-pay | Admitting: Family Medicine

## 2019-10-01 ENCOUNTER — Ambulatory Visit (INDEPENDENT_AMBULATORY_CARE_PROVIDER_SITE_OTHER): Payer: Medicare Other | Admitting: Family Medicine

## 2019-10-01 VITALS — BP 150/62 | HR 69 | Temp 97.6°F | Ht 68.0 in | Wt 150.6 lb

## 2019-10-01 DIAGNOSIS — K219 Gastro-esophageal reflux disease without esophagitis: Secondary | ICD-10-CM | POA: Diagnosis not present

## 2019-10-01 DIAGNOSIS — M8949 Other hypertrophic osteoarthropathy, multiple sites: Secondary | ICD-10-CM

## 2019-10-01 DIAGNOSIS — G609 Hereditary and idiopathic neuropathy, unspecified: Secondary | ICD-10-CM | POA: Diagnosis not present

## 2019-10-01 DIAGNOSIS — N138 Other obstructive and reflux uropathy: Secondary | ICD-10-CM | POA: Diagnosis not present

## 2019-10-01 DIAGNOSIS — R739 Hyperglycemia, unspecified: Secondary | ICD-10-CM

## 2019-10-01 DIAGNOSIS — I10 Essential (primary) hypertension: Secondary | ICD-10-CM

## 2019-10-01 DIAGNOSIS — N401 Enlarged prostate with lower urinary tract symptoms: Secondary | ICD-10-CM

## 2019-10-01 DIAGNOSIS — M159 Polyosteoarthritis, unspecified: Secondary | ICD-10-CM

## 2019-10-01 LAB — CBC WITH DIFFERENTIAL/PLATELET
Basophils Absolute: 0.1 10*3/uL (ref 0.0–0.1)
Basophils Relative: 1.2 % (ref 0.0–3.0)
Eosinophils Absolute: 0.1 10*3/uL (ref 0.0–0.7)
Eosinophils Relative: 1.4 % (ref 0.0–5.0)
HCT: 43.2 % (ref 39.0–52.0)
Hemoglobin: 15.1 g/dL (ref 13.0–17.0)
Lymphocytes Relative: 22.8 % (ref 12.0–46.0)
Lymphs Abs: 1.8 10*3/uL (ref 0.7–4.0)
MCHC: 35 g/dL (ref 30.0–36.0)
MCV: 89.8 fl (ref 78.0–100.0)
Monocytes Absolute: 0.7 10*3/uL (ref 0.1–1.0)
Monocytes Relative: 8.6 % (ref 3.0–12.0)
Neutro Abs: 5.1 10*3/uL (ref 1.4–7.7)
Neutrophils Relative %: 66 % (ref 43.0–77.0)
Platelets: 313 10*3/uL (ref 150.0–400.0)
RBC: 4.81 Mil/uL (ref 4.22–5.81)
RDW: 13.1 % (ref 11.5–15.5)
WBC: 7.7 10*3/uL (ref 4.0–10.5)

## 2019-10-01 LAB — HEPATIC FUNCTION PANEL
ALT: 26 U/L (ref 0–53)
AST: 29 U/L (ref 0–37)
Albumin: 4.1 g/dL (ref 3.5–5.2)
Alkaline Phosphatase: 89 U/L (ref 39–117)
Bilirubin, Direct: 0.3 mg/dL (ref 0.0–0.3)
Total Bilirubin: 1.7 mg/dL — ABNORMAL HIGH (ref 0.2–1.2)
Total Protein: 6.4 g/dL (ref 6.0–8.3)

## 2019-10-01 LAB — BASIC METABOLIC PANEL
BUN: 14 mg/dL (ref 6–23)
CO2: 29 mEq/L (ref 19–32)
Calcium: 9.6 mg/dL (ref 8.4–10.5)
Chloride: 94 mEq/L — ABNORMAL LOW (ref 96–112)
Creatinine, Ser: 0.56 mg/dL (ref 0.40–1.50)
GFR: 136.16 mL/min (ref >60.00)
Glucose, Bld: 100 mg/dL — ABNORMAL HIGH (ref 70–99)
Potassium: 3.9 mEq/L (ref 3.5–5.1)
Sodium: 130 mEq/L — ABNORMAL LOW (ref 135–145)

## 2019-10-01 LAB — TSH: TSH: 3.28 u[IU]/mL (ref 0.35–4.50)

## 2019-10-01 LAB — LIPID PANEL
Cholesterol: 143 mg/dL (ref 0–200)
HDL: 42.5 mg/dL (ref >39.00)
LDL Cholesterol: 79 mg/dL (ref 0–99)
NonHDL: 100.71
Total CHOL/HDL Ratio: 3
Triglycerides: 109 mg/dL (ref 0.0–149.0)
VLDL: 21.8 mg/dL (ref 0.0–40.0)

## 2019-10-01 LAB — HEMOGLOBIN A1C: Hgb A1c MFr Bld: 5.3 % (ref 4.6–6.5)

## 2019-10-01 MED ORDER — MELOXICAM 15 MG PO TABS: 7.5000 mg | ORAL_TABLET | Freq: Every day | ORAL | 3 refills | Status: DC

## 2019-10-01 NOTE — Progress Notes (Signed)
   Subjective:    Patient ID: Gabriel [name], male    DOB: [birth year], 86 y.o.   MRN: [id number]  HPI Here with his daughter to follow up on issues. He feels well overall. His arthritis is stable. His BP is well controlled. His GERD is stable.    Review of Systems  Constitutional: Negative.   HENT: Negative.   Eyes: Negative.   Respiratory: Negative.   Cardiovascular: Negative.   Gastrointestinal: Negative.   Genitourinary: Negative.   Musculoskeletal: Positive for arthralgias.  Skin: Negative.   Neurological: Negative.   Psychiatric/Behavioral: Negative.        Objective:   Physical Exam Constitutional:      General: He is not in acute distress.    Appearance: He is well-developed. He is not diaphoretic.     Comments: Walks with a rolling walker   HENT:     Head: Normocephalic and atraumatic.     Right Ear: External ear normal.     Left Ear: External ear normal.     Nose: Nose normal.     Mouth/Throat:     Pharynx: No oropharyngeal exudate.  Eyes:     General: No scleral icterus.       Right eye: No discharge.        Left eye: No discharge.     Conjunctiva/sclera: Conjunctivae normal.     Pupils: Pupils are equal, round, and reactive to light.  Neck:     Thyroid: No thyromegaly.     Vascular: No JVD.     Trachea: No tracheal deviation.  Cardiovascular:     Rate and Rhythm: Normal rate and regular rhythm.     Heart sounds: Normal heart sounds. No murmur. No friction rub. No gallop.   Pulmonary:     Effort: Pulmonary effort is normal. No respiratory distress.     Breath sounds: Normal breath sounds. No wheezing or rales.  Chest:     Chest wall: No tenderness.  Abdominal:     General: Bowel sounds are normal. There is no distension.     Palpations: Abdomen is soft. There is no mass.     Tenderness: There is no abdominal tenderness. There is no guarding or rebound.  Genitourinary:    Penis: No tenderness.   Musculoskeletal:        General: No tenderness.  Normal range of motion.     Cervical back: Neck supple.  Lymphadenopathy:     Cervical: No cervical adenopathy.  Skin:    General: Skin is warm and dry.     Coloration: Skin is not pale.     Findings: No erythema or rash.  Neurological:     Mental Status: He is alert and oriented to person, place, and time.     Cranial Nerves: No cranial nerve deficit.     Motor: No abnormal muscle tone.     Coordination: Coordination normal.     Deep Tendon Reflexes: Reflexes are normal and symmetric. Reflexes normal.  Psychiatric:        Behavior: Behavior normal.        Thought Content: Thought content normal.        Judgment: Judgment normal.           Assessment & Plan:  He is doing well in general. GERD and HTN are stable. OA is stable. Get fasting labs to check an A1c, etc.  Alysia Penna, MD

## 2019-10-02 ENCOUNTER — Telehealth: Payer: Self-pay | Admitting: Family Medicine

## 2019-10-02 DIAGNOSIS — L570 Actinic keratosis: Secondary | ICD-10-CM | POA: Diagnosis not present

## 2019-10-02 DIAGNOSIS — L814 Other melanin hyperpigmentation: Secondary | ICD-10-CM | POA: Diagnosis not present

## 2019-10-02 DIAGNOSIS — L853 Xerosis cutis: Secondary | ICD-10-CM | POA: Diagnosis not present

## 2019-10-02 DIAGNOSIS — D1801 Hemangioma of skin and subcutaneous tissue: Secondary | ICD-10-CM | POA: Diagnosis not present

## 2019-10-02 DIAGNOSIS — L821 Other seborrheic keratosis: Secondary | ICD-10-CM | POA: Diagnosis not present

## 2019-10-02 NOTE — Telephone Encounter (Signed)
Patient called back for labs. Discussed results with patient, patient expressed understanding. Nothing further needed.

## 2019-10-02 NOTE — Telephone Encounter (Signed)
Pt is returning your call. Thanks

## 2019-10-09 DIAGNOSIS — H40013 Open angle with borderline findings, low risk, bilateral: Secondary | ICD-10-CM | POA: Diagnosis not present

## 2019-10-09 DIAGNOSIS — D3131 Benign neoplasm of right choroid: Secondary | ICD-10-CM | POA: Diagnosis not present

## 2019-10-09 DIAGNOSIS — H02834 Dermatochalasis of left upper eyelid: Secondary | ICD-10-CM | POA: Diagnosis not present

## 2019-10-09 DIAGNOSIS — H02831 Dermatochalasis of right upper eyelid: Secondary | ICD-10-CM | POA: Diagnosis not present

## 2019-10-09 DIAGNOSIS — H353131 Nonexudative age-related macular degeneration, bilateral, early dry stage: Secondary | ICD-10-CM | POA: Diagnosis not present

## 2019-10-09 DIAGNOSIS — H04123 Dry eye syndrome of bilateral lacrimal glands: Secondary | ICD-10-CM | POA: Diagnosis not present

## 2019-10-09 DIAGNOSIS — Z961 Presence of intraocular lens: Secondary | ICD-10-CM | POA: Diagnosis not present

## 2019-10-09 DIAGNOSIS — H0102A Squamous blepharitis right eye, upper and lower eyelids: Secondary | ICD-10-CM | POA: Diagnosis not present

## 2019-10-09 DIAGNOSIS — H0102B Squamous blepharitis left eye, upper and lower eyelids: Secondary | ICD-10-CM | POA: Diagnosis not present

## 2019-11-12 ENCOUNTER — Telehealth: Payer: Self-pay | Admitting: Family Medicine

## 2019-11-12 DIAGNOSIS — H9190 Unspecified hearing loss, unspecified ear: Secondary | ICD-10-CM

## 2019-11-12 NOTE — Addendum Note (Signed)
Addended by: Alysia Penna A on: 11/12/2019 04:56 PM   Modules accepted: Orders

## 2019-11-12 NOTE — Telephone Encounter (Signed)
Pt is calling in needing approval for a hearing test to be done with [facility] 336 [phone] on [address].  Pt would like to have a call to let him know that it has been done.

## 2019-11-12 NOTE — Telephone Encounter (Signed)
The referral was done  

## 2019-11-13 NOTE — Telephone Encounter (Signed)
Left a detailed message on verified voice mail informing the patient that the referral was placed and that they will reach out to him for scheduling.

## 2019-11-14 ENCOUNTER — Other Ambulatory Visit: Payer: Self-pay | Admitting: Family Medicine

## 2019-11-14 DIAGNOSIS — I10 Essential (primary) hypertension: Secondary | ICD-10-CM

## 2019-11-14 DIAGNOSIS — K219 Gastro-esophageal reflux disease without esophagitis: Secondary | ICD-10-CM

## 2019-12-05 DIAGNOSIS — H04213 Epiphora due to excess lacrimation, bilateral lacrimal glands: Secondary | ICD-10-CM | POA: Diagnosis not present

## 2019-12-05 DIAGNOSIS — H90A21 Sensorineural hearing loss, unilateral, right ear, with restricted hearing on the contralateral side: Secondary | ICD-10-CM | POA: Diagnosis not present

## 2019-12-05 DIAGNOSIS — H90A32 Mixed conductive and sensorineural hearing loss, unilateral, left ear with restricted hearing on the contralateral side: Secondary | ICD-10-CM | POA: Diagnosis not present

## 2019-12-05 DIAGNOSIS — Z09 Encounter for follow-up examination after completed treatment for conditions other than malignant neoplasm: Secondary | ICD-10-CM | POA: Diagnosis not present

## 2019-12-05 DIAGNOSIS — H04123 Dry eye syndrome of bilateral lacrimal glands: Secondary | ICD-10-CM | POA: Diagnosis not present

## 2020-01-04 DIAGNOSIS — Z23 Encounter for immunization: Secondary | ICD-10-CM | POA: Diagnosis not present

## 2020-01-17 ENCOUNTER — Other Ambulatory Visit: Payer: Self-pay

## 2020-01-17 ENCOUNTER — Ambulatory Visit (HOSPITAL_COMMUNITY)
Admission: EM | Admit: 2020-01-17 | Discharge: 2020-01-17 | Disposition: A | Discharge status: Home / Self Care | Payer: Medicare Other | Attending: Family Medicine | Admitting: Family Medicine

## 2020-01-17 ENCOUNTER — Encounter (HOSPITAL_COMMUNITY): Payer: Self-pay | Admitting: Emergency Medicine

## 2020-01-17 DIAGNOSIS — L03211 Cellulitis of face: Secondary | ICD-10-CM

## 2020-01-17 MED ORDER — AMOXICILLIN-POT CLAVULANATE 875-125 MG PO TABS: 1.0000 | ORAL_TABLET | Freq: Two times a day (BID) | ORAL | 0 refills | Status: AC

## 2020-01-17 NOTE — Discharge Instructions (Addendum)
Treating you for facial infection Take the medicine as prescribed Follow up as needed for continued or worsening symptoms See Dr. Katy Fitch about your eyes if the problem continues

## 2020-01-17 NOTE — ED Provider Notes (Signed)
[facility]    CSN: [id number] Arrival date & time: 01/17/20  1014      History   Chief Complaint Chief Complaint  Patient presents with  . Dizziness  . Nausea  . Otalgia    HPI Gabriel [name] is a 86 y.o. male.   Pt is a 86 year old male that presents with left facial swelling.  This is been present and worsening over the past few days.  Unable to put his hearing aid in his ear due to the swelling.  Erythema in face.  He does have 2 facial wounds from where he shaving cut himself last week.  He is also had some intermittent dizziness in the mornings.  No fever, chills, nausea, vomiting, chest pain or shortness of breath.  No headaches     Past Medical History:  Diagnosis Date  . Arthritis   . Diverticulosis of colon   . GERD (gastroesophageal reflux disease)   . Hypertension     Patient Active Problem List   Diagnosis Date Noted  . Osteoarthritis 03/21/2018  . Neuropathy, peripheral 05/31/2017  . BPH with urinary obstruction 09/23/2015  . Dysphagia 05/07/2011  . Allergic rhinitis 07/09/2009  . EXTERNAL THROMBOSED HEMORRHOIDS 06/11/2009  . Bradycardia 08/23/2007  . DIVERTICULOSIS, COLON 08/23/2007  . Essential hypertension 09/14/2006  . GERD 09/14/2006  . Personal history of other specified diseases(V13.89) 09/14/2006    Past Surgical History:  Procedure Laterality Date  . cataract extaction, right  6/09   per Dr. [name]  . CATARACT EXTRACTION Left   . COLONOSCOPY  09/23/04   per Dr. [name], clear, no repeats needed   . EGD and esophageal diatation,  05-10-11   per Dr. [name]    . HERNIA REPAIR    . rt inguinal hernia     x2       Home Medications    Prior to Admission medications   Medication Sig Start Date End Date Taking? Authorizing Provider  aspirin 81 MG tablet Take 81 mg by mouth daily.     Yes [provider]  Calcium-Magnesium-Vitamin D (CALCIUM MAGNESIUM PO) Take 1 tablet by mouth every morning.   Yes [provider]  gabapentin (NEURONTIN) 300 MG capsule TAKE 1 CAPSULE BY MOUTH TWICE A DAY 11/14/19  Yes Laurey Morale, MD  GARLIC PO Take 1 capsule by mouth daily.   Yes [provider]  hydrochlorothiazide (HYDRODIURIL) 25 MG tablet TAKE 1 TABLET BY MOUTH EVERY MORNING 11/14/19  Yes Laurey Morale, MD  meloxicam (MOBIC) 15 MG tablet Take 0.5 tablets (7.5 mg total) by mouth daily. 10/01/19  Yes Laurey Morale, MD  Multiple Vitamin (MULTIVITAMIN WITH MINERALS) TABS tablet Take 1 tablet by mouth every morning.   Yes [provider]  multivitamin-lutein (OCUVITE-LUTEIN) CAPS capsule Take 1 capsule by mouth daily.   Yes [provider]  pantoprazole (PROTONIX) 40 MG tablet TAKE 1 TABLET BY MOUTH EVERY DAY 11/14/19  Yes Laurey Morale, MD  vitamin C (ASCORBIC ACID) 500 MG tablet Take 500 mg by mouth daily. Reported on 10/27/2015   Yes [provider]  amoxicillin-clavulanate (AUGMENTIN) 875-125 MG tablet Take 1 tablet by mouth 2 (two) times daily for 10 days. 01/17/20 01/27/20  Orvan July, NP    Family History Family History  Problem Relation Age of Onset  . Coronary artery disease Father   . Colon cancer Neg Hx   . Stomach cancer Neg Hx   . Esophageal cancer  Neg Hx     Social History Social History   Tobacco Use  . Smoking status: Former Smoker    Packs/day: 15.00    Years: 0.50    Pack years: 7.50    Types: Cigarettes  . Smokeless tobacco: Never Used  . Tobacco comment: smoked 18 and stopped and states he stopped 30's   Substance Use Topics  . Alcohol use: No    Alcohol/week: 0.0 standard drinks  . Drug use: No     Allergies   Contrast media [iodinated diagnostic agents]   Review of Systems Review of Systems   Physical Exam Triage Vital Signs ED Triage Vitals  Enc Vitals Group     BP 01/17/20 1143 (!) 147/113     Pulse Rate 01/17/20 1143 86     Resp 01/17/20 1143 15     Temp 01/17/20 1143 98.1 F (36.7 C)     Temp Source 01/17/20  1143 Oral     SpO2 01/17/20 1143 100 %     Weight --      Height --      Head Circumference --      Peak Flow --      Pain Score 01/17/20 1140 2     Pain Loc --      Pain Edu? --      Excl. in [place]? --    No data found.  Updated Vital Signs BP (!) 147/113 (BP Location: Left Arm)   Pulse 86   Temp 98.1 F (36.7 C) (Oral)   Resp 15   SpO2 100%   Visual Acuity Right Eye Distance:   Left Eye Distance:   Bilateral Distance:    Right Eye Near:   Left Eye Near:    Bilateral Near:     Physical Exam Vitals and nursing note reviewed.  Constitutional:      Appearance: Normal appearance.  HENT:     Head: Normocephalic and atraumatic.     Nose: Nose normal.  Eyes:     Conjunctiva/sclera: Conjunctivae normal.  Pulmonary:     Effort: Pulmonary effort is normal.  Abdominal:     Palpations: Abdomen is soft.  Musculoskeletal:        General: Normal range of motion.     Cervical back: Normal range of motion.  Skin:    General: Skin is warm and dry.     Comments: See picture for detail   Neurological:     General: No focal deficit present.     Mental Status: He is alert.  Psychiatric:        Mood and Affect: Mood normal.        UC Treatments / Results  Labs (all labs ordered are listed, but only abnormal results are displayed) Labs Reviewed - No data to display  EKG   Radiology No results found.  Procedures Procedures (including critical care time)  Medications Ordered in UC Medications - No data to display  Initial Impression / Assessment and Plan / UC Course  I have reviewed the triage vital signs and the nursing notes.  Pertinent labs & imaging results that were available during my care of the patient were reviewed by me and considered in my medical decision making (see chart for details).     Facial cellulitis Treating with Augmentin. Follow-up for any continued or worsening problems. Recommend drink more fluids to ensure hydration.  Follow-up  with Dr. Carolynn Sayers as needed Final Clinical Impressions(s) / UC Diagnoses   Final  diagnoses:  Facial cellulitis     Discharge Instructions     Treating you for facial infection Take the medicine as prescribed Follow up as needed for continued or worsening symptoms See Dr. Katy Fitch about your eyes if the problem continues       ED Prescriptions    Medication Sig Dispense Auth. Provider   amoxicillin-clavulanate (AUGMENTIN) 875-125 MG tablet Take 1 tablet by mouth 2 (two) times daily for 10 days. 20 tablet Loura Halt A, NP     PDMP not reviewed this encounter.   Loura Halt A, NP 01/18/20 1238

## 2020-01-17 NOTE — ED Triage Notes (Addendum)
Pt states this morning he felt dizzy, nauseous and left ear was swollen and he could not put in his hearing aid. Pt states he had a lack of appetite and didn't fell right this morning. Pt also states he has had blurry vision intermittently for the last week.

## 2020-01-21 ENCOUNTER — Other Ambulatory Visit: Payer: Self-pay

## 2020-01-22 ENCOUNTER — Encounter: Payer: Self-pay | Admitting: Family Medicine

## 2020-01-22 ENCOUNTER — Ambulatory Visit (INDEPENDENT_AMBULATORY_CARE_PROVIDER_SITE_OTHER): Payer: Medicare Other | Admitting: Family Medicine

## 2020-01-22 VITALS — BP 140/50 | HR 87 | Temp 98.3°F | Ht 66.0 in | Wt 155.0 lb

## 2020-01-22 DIAGNOSIS — L03211 Cellulitis of face: Secondary | ICD-10-CM | POA: Diagnosis not present

## 2020-01-22 NOTE — Progress Notes (Signed)
   Subjective:    Patient ID: Gabriel [name], male    DOB: 1924/08/02, 86 y.o.   MRN: [id number]  HPI Here to follow up on an urgent care visit on 01-17-20 for redness and pain on the left side of his face. He was diagnosed with cellulitis, and he was started on a 10 day course of Augmentin. He now feels better and the redness and swelling are going away. No fever. I reviewed these records, which included a photo of his face.   Review of Systems  Constitutional: Negative.   HENT: Positive for facial swelling.   Respiratory: Negative.   Cardiovascular: Negative.   Skin: Positive for rash.       Objective:   Physical Exam Constitutional:      Appearance: Normal appearance. He is not ill-appearing.  Eyes:     Extraocular Movements: Extraocular movements intact.     Conjunctiva/sclera: Conjunctivae normal.     Pupils: Pupils are equal, round, and reactive to light.  Cardiovascular:     Rate and Rhythm: Normal rate and regular rhythm.     Pulses: Normal pulses.     Heart sounds: Normal heart sounds.  Pulmonary:     Effort: Pulmonary effort is normal.     Breath sounds: Normal breath sounds.  Lymphadenopathy:     Cervical: No cervical adenopathy.  Skin:    Comments: The left cheek and left ear lobe have a few small areas of erythema, but most of his skin has cleared  Neurological:     Mental Status: He is alert.           Assessment & Plan:  Cellulitis, partially treated. He will finish out the Augmentin. Recheck as needed.  Alysia Penna, MD

## 2020-02-05 ENCOUNTER — Other Ambulatory Visit: Payer: Self-pay

## 2020-02-06 ENCOUNTER — Ambulatory Visit (INDEPENDENT_AMBULATORY_CARE_PROVIDER_SITE_OTHER): Payer: Medicare Other | Admitting: Family Medicine

## 2020-02-06 ENCOUNTER — Encounter: Payer: Self-pay | Admitting: Family Medicine

## 2020-02-06 VITALS — BP 130/72 | HR 59 | Temp 99.1°F | Ht 66.0 in | Wt 154.6 lb

## 2020-02-06 DIAGNOSIS — I10 Essential (primary) hypertension: Secondary | ICD-10-CM

## 2020-02-06 DIAGNOSIS — K219 Gastro-esophageal reflux disease without esophagitis: Secondary | ICD-10-CM

## 2020-02-06 DIAGNOSIS — B0222 Postherpetic trigeminal neuralgia: Secondary | ICD-10-CM

## 2020-02-06 DIAGNOSIS — B029 Zoster without complications: Secondary | ICD-10-CM

## 2020-02-06 MED ORDER — METHYLPREDNISOLONE 4 MG PO TBPK: ORAL_TABLET | ORAL | 0 refills | Status: DC

## 2020-02-06 MED ORDER — HYDROCHLOROTHIAZIDE 25 MG PO TABS: 25.0000 mg | ORAL_TABLET | Freq: Every morning | ORAL | 3 refills | Status: DC

## 2020-02-06 MED ORDER — PANTOPRAZOLE SODIUM 40 MG PO TBEC: 40.0000 mg | DELAYED_RELEASE_TABLET | Freq: Every day | ORAL | 3 refills | Status: DC

## 2020-02-06 MED ORDER — GABAPENTIN 300 MG PO CAPS
300.0000 mg | ORAL_CAPSULE | Freq: Three times a day (TID) | ORAL | 1 refills | Status: DC
Start: 2020-02-06 — End: 2020-06-10

## 2020-02-06 NOTE — Progress Notes (Signed)
   Subjective:    Patient ID: Gabriel [name], male    DOB: 1924-07-11, 86 y.o.   MRN: [id number]  HPI Here to follow up a rash on the left side of his face. He was seen in the ER on 01-17-20 and then here on 01-22-20 for a blistering rash. This was felt to be cellulitis and he was treated with 10 days of Augmentin. Now the rash has cleared up but he still has soreness and sensitivity in the left side of the face. This extends form the left ear, over the cheek, and down to the jaw area.    Review of Systems  Constitutional: Negative.   Respiratory: Negative.   Cardiovascular: Negative.        Objective:   Physical Exam Constitutional:      Appearance: Normal appearance.  Cardiovascular:     Rate and Rhythm: Normal rate and regular rhythm.     Pulses: Normal pulses.     Heart sounds: Normal heart sounds.  Pulmonary:     Effort: Pulmonary effort is normal.     Breath sounds: Normal breath sounds.  Lymphadenopathy:     Cervical: No cervical adenopathy.  Skin:    Comments: The skin on his face is clear, but he is very sensitive to light touch on the left cheek   Neurological:     Mental Status: He is alert.           Assessment & Plan:  It now seems this rash was from a bout of shingles which likely had a secondary bacterial infection. The bacterial component is now resolved, but the neuropathic pain persists. We will give him a Medrol dose pack and we will increase the Gabapentin to 300 mg TID. Recheck prn. Alysia Penna, MD

## 2020-02-12 ENCOUNTER — Ambulatory Visit: Payer: Medicare Other | Admitting: Family Medicine

## 2020-02-21 ENCOUNTER — Emergency Department (HOSPITAL_COMMUNITY)
Admission: EM | Admit: 2020-02-21 | Discharge: 2020-02-21 | Disposition: A | Discharge status: Home / Self Care | Payer: Medicare Other | Attending: Emergency Medicine | Admitting: Emergency Medicine

## 2020-02-21 ENCOUNTER — Other Ambulatory Visit: Payer: Self-pay

## 2020-02-21 DIAGNOSIS — E876 Hypokalemia: Secondary | ICD-10-CM

## 2020-02-21 DIAGNOSIS — Z79899 Other long term (current) drug therapy: Secondary | ICD-10-CM | POA: Diagnosis not present

## 2020-02-21 DIAGNOSIS — R112 Nausea with vomiting, unspecified: Secondary | ICD-10-CM | POA: Diagnosis not present

## 2020-02-21 DIAGNOSIS — I10 Essential (primary) hypertension: Secondary | ICD-10-CM | POA: Insufficient documentation

## 2020-02-21 DIAGNOSIS — R110 Nausea: Secondary | ICD-10-CM | POA: Diagnosis not present

## 2020-02-21 DIAGNOSIS — E871 Hypo-osmolality and hyponatremia: Secondary | ICD-10-CM | POA: Diagnosis not present

## 2020-02-21 DIAGNOSIS — Z7982 Long term (current) use of aspirin: Secondary | ICD-10-CM | POA: Diagnosis not present

## 2020-02-21 DIAGNOSIS — Z87891 Personal history of nicotine dependence: Secondary | ICD-10-CM | POA: Insufficient documentation

## 2020-02-21 DIAGNOSIS — R531 Weakness: Secondary | ICD-10-CM | POA: Diagnosis not present

## 2020-02-21 DIAGNOSIS — R1111 Vomiting without nausea: Secondary | ICD-10-CM | POA: Diagnosis not present

## 2020-02-21 DIAGNOSIS — W19XXXA Unspecified fall, initial encounter: Secondary | ICD-10-CM | POA: Diagnosis not present

## 2020-02-21 LAB — COMPREHENSIVE METABOLIC PANEL
ALT: 27 U/L (ref 0–44)
AST: 42 U/L — ABNORMAL HIGH (ref 15–41)
Albumin: 3.1 g/dL — ABNORMAL LOW (ref 3.5–5.0)
Alkaline Phosphatase: 86 U/L (ref 38–126)
Anion gap: 12 (ref 5–15)
BUN: 16 mg/dL (ref 8–23)
CO2: 23 mmol/L (ref 22–32)
Calcium: 8.6 mg/dL — ABNORMAL LOW (ref 8.9–10.3)
Chloride: 92 mmol/L — ABNORMAL LOW (ref 98–111)
Creatinine, Ser: 0.67 mg/dL (ref 0.61–1.24)
GFR, Estimated: >60 mL/min (ref >60)
Glucose, Bld: 132 mg/dL — ABNORMAL HIGH (ref 70–99)
Potassium: 2.6 mmol/L — CL (ref 3.5–5.1)
Sodium: 127 mmol/L — ABNORMAL LOW (ref 135–145)
Total Bilirubin: 1.4 mg/dL — ABNORMAL HIGH (ref 0.3–1.2)
Total Protein: 6.1 g/dL — ABNORMAL LOW (ref 6.5–8.1)

## 2020-02-21 LAB — CBC WITH DIFFERENTIAL/PLATELET
Abs Immature Granulocytes: 0.09 10*3/uL — ABNORMAL HIGH (ref 0.00–0.07)
Basophils Absolute: 0 10*3/uL (ref 0.0–0.1)
Basophils Relative: 0 %
Eosinophils Absolute: 0.3 10*3/uL (ref 0.0–0.5)
Eosinophils Relative: 2 %
HCT: 39.1 % (ref 39.0–52.0)
Hemoglobin: 14.3 g/dL (ref 13.0–17.0)
Immature Granulocytes: 1 %
Lymphocytes Relative: 6 %
Lymphs Abs: 0.8 10*3/uL (ref 0.7–4.0)
MCH: 31.1 pg (ref 26.0–34.0)
MCHC: 36.6 g/dL — ABNORMAL HIGH (ref 30.0–36.0)
MCV: 85 fL (ref 80.0–100.0)
Monocytes Absolute: 0.7 10*3/uL (ref 0.1–1.0)
Monocytes Relative: 6 %
Neutro Abs: 10.8 10*3/uL — ABNORMAL HIGH (ref 1.7–7.7)
Neutrophils Relative %: 85 %
Platelets: 292 10*3/uL (ref 150–400)
RBC: 4.6 MIL/uL (ref 4.22–5.81)
RDW: 12.8 % (ref 11.5–15.5)
WBC: 12.8 10*3/uL — ABNORMAL HIGH (ref 4.0–10.5)
nRBC: 0 % (ref 0.0–0.2)

## 2020-02-21 LAB — TROPONIN I (HIGH SENSITIVITY): Troponin I (High Sensitivity): 9 ng/L (ref <18)

## 2020-02-21 LAB — LIPASE, BLOOD: Lipase: 23 U/L (ref 11–51)

## 2020-02-21 LAB — MAGNESIUM: Magnesium: 1.6 mg/dL — ABNORMAL LOW (ref 1.7–2.4)

## 2020-02-21 MED ORDER — ONDANSETRON HCL 4 MG/2ML IJ SOLN
4.0000 mg | Freq: Once | INTRAMUSCULAR | Status: AC
  Administered 2020-02-21: 4 mg via INTRAVENOUS
  Filled 2020-02-21: qty 2

## 2020-02-21 MED ORDER — POTASSIUM CHLORIDE CRYS ER 20 MEQ PO TBCR: EXTENDED_RELEASE_TABLET | ORAL | 0 refills | Status: DC

## 2020-02-21 MED ORDER — POTASSIUM CHLORIDE CRYS ER 20 MEQ PO TBCR
40.0000 meq | EXTENDED_RELEASE_TABLET | Freq: Once | ORAL | Status: AC
  Administered 2020-02-21: 40 meq via ORAL
  Filled 2020-02-21: qty 2

## 2020-02-21 MED ORDER — ONDANSETRON HCL 4 MG PO TABS: 4.0000 mg | ORAL_TABLET | Freq: Four times a day (QID) | ORAL | 0 refills | Status: DC | PRN

## 2020-02-21 MED ORDER — SODIUM CHLORIDE 0.9 % IV BOLUS
500.0000 mL | Freq: Once | INTRAVENOUS | Status: AC
  Administered 2020-02-21: 500 mL via INTRAVENOUS

## 2020-02-21 MED ORDER — POTASSIUM CHLORIDE 10 MEQ/100ML IV SOLN
10.0000 meq | INTRAVENOUS | Status: AC
  Administered 2020-02-21 (×3): 10 meq via INTRAVENOUS
  Filled 2020-02-21 (×3): qty 100

## 2020-02-21 MED ORDER — MAGNESIUM OXIDE 400 (241.3 MG) MG PO TABS
400.0000 mg | ORAL_TABLET | Freq: Once | ORAL | Status: AC
  Administered 2020-02-21: 400 mg via ORAL
  Filled 2020-02-21: qty 1

## 2020-02-21 MED ORDER — MAG-OXIDE 200 MG PO TABS: 400.0000 mg | ORAL_TABLET | Freq: Two times a day (BID) | ORAL | 0 refills | Status: AC

## 2020-02-21 NOTE — Discharge Instructions (Addendum)
It is important to get your electrolytes re-checked by your primary care physician in the next 1 week.

## 2020-02-21 NOTE — ED Triage Notes (Addendum)
Per EMS, patient woke up nauseous and vomiting. He vomited x 1 before EMS picked him up. He has no c/o chest, jaw, shoulder, or back pain. He vomited x 1 during transport by EMS approx. 76ml brown liquid emesis. Per EMS, he stated that he felt better after vomiting. He is A&O x 4 and HOH.

## 2020-02-21 NOTE — ED Notes (Signed)
Pt alert, lying in bed, no needs expressed.  Family at bedside.

## 2020-02-21 NOTE — ED Notes (Signed)
Discharge paperwork reviewed with pt and pts daughter, [name].  Cathy agreeable to transport pt back to [facility].  Pt assisted to dress and transfer to wheelchair, wheeled to ED entrance to meet ride by NT.

## 2020-02-21 NOTE — ED Provider Notes (Signed)
[facility] DEPT Provider Note   CSN: [id number] Arrival date & time: 02/21/20  4888   History Chief Complaint  Patient presents with  . Nausea  . Emesis    Gabriel [name] is a 86 y.o. male.  The history is provided by the patient and the EMS personnel.  Emesis He has history of hypertension and comes in because of nausea and vomiting.  He woke up with nausea and vomited at home.  He states that he had walked to the commode and found himself on the floor.  He does not think he had a syncopal episode and does not think he fell.  He denies any injury.  He denies abdominal pain or chest pain.  He denies chest heaviness, tightness, pressure.  He vomited in the ambulance coming to the hospital and states he has felt better since then.  He wonders if for she had a dinner may have been bad, but he knows of no one else who got sick.  Past Medical History:  Diagnosis Date  . Arthritis   . Diverticulosis of colon   . GERD (gastroesophageal reflux disease)   . Hypertension     Patient Active Problem List   Diagnosis Date Noted  . Osteoarthritis 03/21/2018  . Neuropathy, peripheral 05/31/2017  . BPH with urinary obstruction 09/23/2015  . Dysphagia 05/07/2011  . Allergic rhinitis 07/09/2009  . EXTERNAL THROMBOSED HEMORRHOIDS 06/11/2009  . Bradycardia 08/23/2007  . DIVERTICULOSIS, COLON 08/23/2007  . Essential hypertension 09/14/2006  . GERD 09/14/2006  . Personal history of other specified diseases(V13.89) 09/14/2006    Past Surgical History:  Procedure Laterality Date  . cataract extaction, right  6/09   per Dr. [name]  . CATARACT EXTRACTION Left   . COLONOSCOPY  09/23/04   per Dr. [name], clear, no repeats needed   . EGD and esophageal diatation,  05-10-11   per Dr. [name]    . HERNIA REPAIR    . rt inguinal hernia     x2       Family History  Problem Relation Age of Onset  . Coronary artery disease Father   . Colon cancer Neg Hx   .  Stomach cancer Neg Hx   . Esophageal cancer Neg Hx     Social History   Tobacco Use  . Smoking status: Former Smoker    Packs/day: 15.00    Years: 0.50    Pack years: 7.50    Types: Cigarettes  . Smokeless tobacco: Never Used  . Tobacco comment: smoked 18 and stopped and states he stopped 30's   Substance Use Topics  . Alcohol use: No    Alcohol/week: 0.0 standard drinks  . Drug use: No    Home Medications Prior to Admission medications   Medication Sig Start Date End Date Taking? Authorizing Provider  aspirin 81 MG tablet Take 81 mg by mouth daily.      [provider]  Calcium-Magnesium-Vitamin D (CALCIUM MAGNESIUM PO) Take 1 tablet by mouth every morning.    [provider]  gabapentin (NEURONTIN) 300 MG capsule Take 1 capsule (300 mg total) by mouth 3 (three) times daily. 02/06/20   Laurey Morale, MD  GARLIC PO Take 1 capsule by mouth daily.    [provider]  hydrochlorothiazide (HYDRODIURIL) 25 MG tablet Take 1 tablet (25 mg total) by mouth every morning. 02/06/20   Laurey Morale, MD  meloxicam (MOBIC) 15 MG tablet Take 0.5 tablets (7.5 mg total) by  mouth daily. 10/01/19   Laurey Morale, MD  methylPREDNISolone (MEDROL DOSEPAK) 4 MG TBPK tablet As directed 02/06/20   Laurey Morale, MD  Multiple Vitamin (MULTIVITAMIN WITH MINERALS) TABS tablet Take 1 tablet by mouth every morning.    [provider]  multivitamin-lutein (OCUVITE-LUTEIN) CAPS capsule Take 1 capsule by mouth daily.    [provider]  pantoprazole (PROTONIX) 40 MG tablet Take 1 tablet (40 mg total) by mouth daily. 02/06/20   Laurey Morale, MD  vitamin C (ASCORBIC ACID) 500 MG tablet Take 500 mg by mouth daily. Reported on 10/27/2015    [provider]    Allergies    Contrast media [iodinated diagnostic agents]  Review of Systems   Review of Systems  Gastrointestinal: Positive for vomiting.  All other systems reviewed and are negative.   Physical  Exam Updated Vital Signs BP 139/81 (BP Location: Right Arm)   Pulse 72   Temp 98.1 F (36.7 C) (Oral)   Resp 16   Ht 5\' 7"  (1.702 m)   Wt 70.3 kg   SpO2 97%   BMI 24.28 kg/m   Physical Exam Vitals and nursing note reviewed.   86 year old male, resting comfortably and in no acute distress. Vital signs are normal. Oxygen saturation is 97%, which is normal. Head is normocephalic and atraumatic. PERRLA, EOMI. Oropharynx is clear. Neck is nontender and supple without adenopathy or JVD. Back is nontender and there is no CVA tenderness. Lungs are clear without rales, wheezes, or rhonchi. Chest is nontender. Heart has regular rate and rhythm without murmur. Abdomen is soft, flat, nontender without masses or hepatosplenomegaly and peristalsis is hypoactive. Extremities have no cyanosis or edema, full range of motion is present. Skin is warm and dry without rash. Neurologic: Mental status is normal, cranial nerves are intact, there are no motor or sensory deficits.  ED Results / Procedures / Treatments   Labs (all labs ordered are listed, but only abnormal results are displayed) Labs Reviewed  COMPREHENSIVE METABOLIC PANEL - Abnormal; Notable for the following components:      Result Value   Sodium 127 (*)    Potassium 2.6 (*)    Chloride 92 (*)    Glucose, Bld 132 (*)    Calcium 8.6 (*)    Total Protein 6.1 (*)    Albumin 3.1 (*)    AST 42 (*)    Total Bilirubin 1.4 (*)    All other components within normal limits  CBC WITH DIFFERENTIAL/PLATELET - Abnormal; Notable for the following components:   WBC 12.8 (*)    MCHC 36.6 (*)    Neutro Abs 10.8 (*)    Abs Immature Granulocytes 0.09 (*)    All other components within normal limits  LIPASE, BLOOD  TROPONIN I (HIGH SENSITIVITY)  TROPONIN I (HIGH SENSITIVITY)    EKG EKG Interpretation  Date/Time:  Thursday February 21 2020 04:22:05 EDT Ventricular Rate:  65 PR Interval:    QRS Duration: 92 QT Interval:  412 QTC  Calculation: 429 R Axis:   27 Text Interpretation: Sinus rhythm Atrial premature complexes Prolonged PR interval Nonspecific repol abnormality, diffuse leads No old tracing to compare Confirmed by Delora Fuel ([id number]) on 02/21/2020 4:26:02 AM  Procedures Procedures  Medications Ordered in ED Medications  potassium chloride 10 mEq in 100 mL IVPB (10 mEq Intravenous New Bag/Given 02/21/20 0547)  sodium chloride 0.9 % bolus 500 mL (0 mLs Intravenous Stopped 02/21/20 0550)  ondansetron (ZOFRAN) injection 4  mg (4 mg Intravenous Given 02/21/20 0437)  potassium chloride SA (KLOR-CON) CR tablet 40 mEq (40 mEq Oral Given 02/21/20 0550)    ED Course  I have reviewed the triage vital signs and the nursing notes.  Pertinent labs & imaging results that were available during my care of the patient were reviewed by me and considered in my medical decision making (see chart for details).  MDM Rules/Calculators/A&P Nausea and vomiting, possible food poisoning.  Possible viral gastritis.  Will check ECG and troponin to rule out ACS.  Will be given IV fluids and ondansetron.  Old records are reviewed, and he has no relevant past visits.  ECG shows no acute changes.  He feels much better after above-noted treatment.  Labs show severe hypokalemia.  This seems out of proportion to degree of vomiting.  It is noted that he is on a thiazide diuretic so I suspect that he had pre-existing hypokalemia which was worsened by emesis.  Other lab findings include mild to moderate hyponatremia which is not felt to be clinically significant.  He is given oral and intravenous potassium and is discharged with prescriptions for K-Dur and ondansetron.  Final Clinical Impression(s) / ED Diagnoses Final diagnoses:  Non-intractable vomiting with nausea, unspecified vomiting type  Hypokalemia  Hyponatremia    Rx / DC Orders ED Discharge Orders         Ordered    potassium chloride SA (KLOR-CON) 20 MEQ tablet         02/21/20 0652    ondansetron (ZOFRAN) 4 MG tablet  Every 6 hours PRN        02/21/20 7711           Delora Fuel, MD 65/79/03 [phone]

## 2020-02-21 NOTE — ED Provider Notes (Signed)
Care transferred to me.  Patient is finishing up his third round of IV potassium.  He has already received oral potassium and is getting ready to eat breakfast.  Mildly low magnesium so we will also replete this and send prescription.  Otherwise, he is on HCTZ and I discussed that he needs to discuss with his PCP whether he needs potassium long-term.  This is probably the root cause.  Get electrolytes rechecked next week.   Sherwood Gambler, MD 02/21/20 [phone]

## 2020-02-28 ENCOUNTER — Encounter: Payer: Self-pay | Admitting: Family Medicine

## 2020-02-28 ENCOUNTER — Ambulatory Visit (INDEPENDENT_AMBULATORY_CARE_PROVIDER_SITE_OTHER): Payer: Medicare Other | Admitting: Family Medicine

## 2020-02-28 ENCOUNTER — Other Ambulatory Visit: Payer: Self-pay

## 2020-02-28 VITALS — BP 124/82 | HR 86 | Temp 98.6°F | Wt 153.0 lb

## 2020-02-28 DIAGNOSIS — G609 Hereditary and idiopathic neuropathy, unspecified: Secondary | ICD-10-CM | POA: Diagnosis not present

## 2020-02-28 DIAGNOSIS — E876 Hypokalemia: Secondary | ICD-10-CM | POA: Diagnosis not present

## 2020-02-28 DIAGNOSIS — I10 Essential (primary) hypertension: Secondary | ICD-10-CM

## 2020-02-28 MED ORDER — POTASSIUM CHLORIDE ER 10 MEQ PO TBCR: 10.0000 meq | EXTENDED_RELEASE_TABLET | Freq: Every day | ORAL | 3 refills | Status: DC

## 2020-02-28 NOTE — Progress Notes (Signed)
   Subjective:    Patient ID: Gabriel [name], male    DOB: 1924-12-31, 86 y.o.   MRN: [id number]  HPI Here with his daughter to follow up an ED visit on 02-21-20 for 24 hours of nausea and vomiting. No diarrhea or fever. His labs showed a WBC of 12.8 and his potassium was quite low at 2.6. he was rehydrated and his electrolytes were corrected. They sent him home with a short supply of potassium to take 20 mEq BID. He feels back to normal now.    Review of Systems  Constitutional: Negative.   Respiratory: Negative.   Cardiovascular: Negative.   Gastrointestinal: Negative.        Objective:   Physical Exam Constitutional:      Appearance: Normal appearance.  Cardiovascular:     Rate and Rhythm: Normal rate and regular rhythm.     Pulses: Normal pulses.     Heart sounds: Normal heart sounds.  Pulmonary:     Effort: Pulmonary effort is normal.     Breath sounds: Normal breath sounds.  Abdominal:     General: Abdomen is flat. Bowel sounds are normal. There is no distension.     Palpations: There is no mass.     Tenderness: There is no abdominal tenderness. There is no guarding or rebound.     Hernia: No hernia is present.  Neurological:     Mental Status: He is alert.           Assessment & Plan:  He has recovered from what was likely a viral enteritis. We will check a BMET today and start him on a standing dose of Klor-con 10 mEq daily.  Alysia Penna, MD

## 2020-02-29 LAB — BASIC METABOLIC PANEL WITH GFR
BUN/Creatinine Ratio: 20 (calc) (ref 6–22)
BUN: 13 mg/dL (ref 7–25)
CO2: 24 mmol/L (ref 20–32)
Calcium: 9.8 mg/dL (ref 8.6–10.3)
Chloride: 96 mmol/L — ABNORMAL LOW (ref 98–110)
Creat: 0.66 mg/dL — ABNORMAL LOW (ref 0.70–1.11)
GFR, Est African American: 97 mL/min/{1.73_m2} (ref >=60)
GFR, Est Non African American: 83 mL/min/{1.73_m2} (ref >=60)
Glucose, Bld: 90 mg/dL (ref 65–99)
Potassium: 5.1 mmol/L (ref 3.5–5.3)
Sodium: 132 mmol/L — ABNORMAL LOW (ref 135–146)

## 2020-02-29 LAB — VITAMIN B12: Vitamin B-12: 661 pg/mL (ref 200–1100)

## 2020-03-04 DIAGNOSIS — Z23 Encounter for immunization: Secondary | ICD-10-CM | POA: Diagnosis not present

## 2020-03-10 ENCOUNTER — Telehealth: Payer: Self-pay | Admitting: Family Medicine

## 2020-03-10 NOTE — Telephone Encounter (Signed)
Gabriel [name] called to see if there is a dietary supplement that the patient can drink or take to help increase his appetite. The patient is still not eating much and is still losing weight.  Please advise

## 2020-03-11 ENCOUNTER — Telehealth: Payer: Self-pay | Admitting: Family Medicine

## 2020-03-11 DIAGNOSIS — R1310 Dysphagia, unspecified: Secondary | ICD-10-CM

## 2020-03-11 MED ORDER — MEGESTROL ACETATE 20 MG PO TABS
20.0000 mg | ORAL_TABLET | Freq: Three times a day (TID) | ORAL | 5 refills | Status: DC
Start: 2020-03-11 — End: 2020-09-02

## 2020-03-11 NOTE — Addendum Note (Signed)
Addended by: Konrad Saha on: 03/11/2020 01:13 PM   Modules accepted: Orders

## 2020-03-11 NOTE — Telephone Encounter (Signed)
I out in a referral to see Dr. [name], the GI who he saw last time

## 2020-03-11 NOTE — Telephone Encounter (Signed)
Patient is calling and requesting a call back regarding his recent medical issues with his stomach, please advise. CB is [phone]

## 2020-03-11 NOTE — Telephone Encounter (Signed)
Patient is c/o that since it has been 8 yrs since his last time he had his esophagus stretched could that be the reason for the appetite. He is also is having lots of burping.   Please advise.  Thanks.   Dm/cma

## 2020-03-11 NOTE — Telephone Encounter (Signed)
Please call the medication in

## 2020-03-11 NOTE — Telephone Encounter (Signed)
Patients daughter notified VIA phone. Dm/cma  

## 2020-03-11 NOTE — Telephone Encounter (Signed)
Patient notified VIA phone and thanks you for putting the referral in for him.  Dm/cma

## 2020-03-11 NOTE — Telephone Encounter (Signed)
rx sent to the pharmacy for pt's daughter to pick up.  Dm/cma

## 2020-03-11 NOTE — Telephone Encounter (Signed)
Call in Megestrol 20 mg to take TID before meals, #90 with 5 rf

## 2020-03-11 NOTE — Telephone Encounter (Signed)
Please advise on message below. Thanks. Dm/cma  

## 2020-03-26 ENCOUNTER — Telehealth: Payer: Self-pay | Admitting: Family Medicine

## 2020-03-26 NOTE — Telephone Encounter (Signed)
Pts daughter [name]) is calling in stating that he would like to have PT at [facility] at [place] at the facility that he is currently living at.  Physical Therapist is Minna Merritts and would like to see if he could get the same one.  Pts daughter would like to have a call back to let them know when it has been done.

## 2020-03-26 NOTE — Telephone Encounter (Signed)
Please set up PT with Gabriel [name] for peripheral neuropathy

## 2020-03-26 NOTE — Telephone Encounter (Signed)
Please advise on message below regarding wanting PT at facility he lives at.   Thanks.  Dm/cma

## 2020-03-27 NOTE — Telephone Encounter (Signed)
Spoke to Hospital doctor), phone number to [facility] [address] [phone].  Will call to see what needs done to get PT with Minna Merritts.  Dm/cma

## 2020-03-27 NOTE — Telephone Encounter (Signed)
lft VM to get a rtn call on what they need for patient to get PT with Minna Merritts. Patient lives in the independent apts.  Dm/cma

## 2020-04-01 DIAGNOSIS — R2681 Unsteadiness on feet: Secondary | ICD-10-CM | POA: Diagnosis not present

## 2020-04-01 DIAGNOSIS — M6281 Muscle weakness (generalized): Secondary | ICD-10-CM | POA: Diagnosis not present

## 2020-04-01 DIAGNOSIS — Z9181 History of falling: Secondary | ICD-10-CM | POA: Diagnosis not present

## 2020-04-02 DIAGNOSIS — Z9181 History of falling: Secondary | ICD-10-CM | POA: Diagnosis not present

## 2020-04-02 DIAGNOSIS — R2681 Unsteadiness on feet: Secondary | ICD-10-CM | POA: Diagnosis not present

## 2020-04-02 DIAGNOSIS — M6281 Muscle weakness (generalized): Secondary | ICD-10-CM | POA: Diagnosis not present

## 2020-04-04 DIAGNOSIS — Z9181 History of falling: Secondary | ICD-10-CM | POA: Diagnosis not present

## 2020-04-04 DIAGNOSIS — M6281 Muscle weakness (generalized): Secondary | ICD-10-CM | POA: Diagnosis not present

## 2020-04-04 DIAGNOSIS — R2681 Unsteadiness on feet: Secondary | ICD-10-CM | POA: Diagnosis not present

## 2020-04-07 DIAGNOSIS — M6281 Muscle weakness (generalized): Secondary | ICD-10-CM | POA: Diagnosis not present

## 2020-04-07 DIAGNOSIS — Z9181 History of falling: Secondary | ICD-10-CM | POA: Diagnosis not present

## 2020-04-07 DIAGNOSIS — R2681 Unsteadiness on feet: Secondary | ICD-10-CM | POA: Diagnosis not present

## 2020-04-09 DIAGNOSIS — H40013 Open angle with borderline findings, low risk, bilateral: Secondary | ICD-10-CM | POA: Diagnosis not present

## 2020-04-09 DIAGNOSIS — D3131 Benign neoplasm of right choroid: Secondary | ICD-10-CM | POA: Diagnosis not present

## 2020-04-10 DIAGNOSIS — R2681 Unsteadiness on feet: Secondary | ICD-10-CM | POA: Diagnosis not present

## 2020-04-10 DIAGNOSIS — Z9181 History of falling: Secondary | ICD-10-CM | POA: Diagnosis not present

## 2020-04-10 DIAGNOSIS — M6281 Muscle weakness (generalized): Secondary | ICD-10-CM | POA: Diagnosis not present

## 2020-04-11 DIAGNOSIS — Z9181 History of falling: Secondary | ICD-10-CM | POA: Diagnosis not present

## 2020-04-11 DIAGNOSIS — R2681 Unsteadiness on feet: Secondary | ICD-10-CM | POA: Diagnosis not present

## 2020-04-11 DIAGNOSIS — M6281 Muscle weakness (generalized): Secondary | ICD-10-CM | POA: Diagnosis not present

## 2020-04-14 DIAGNOSIS — R2681 Unsteadiness on feet: Secondary | ICD-10-CM | POA: Diagnosis not present

## 2020-04-14 DIAGNOSIS — M6281 Muscle weakness (generalized): Secondary | ICD-10-CM | POA: Diagnosis not present

## 2020-04-14 DIAGNOSIS — Z9181 History of falling: Secondary | ICD-10-CM | POA: Diagnosis not present

## 2020-04-16 DIAGNOSIS — M6281 Muscle weakness (generalized): Secondary | ICD-10-CM | POA: Diagnosis not present

## 2020-04-16 DIAGNOSIS — R2681 Unsteadiness on feet: Secondary | ICD-10-CM | POA: Diagnosis not present

## 2020-04-16 DIAGNOSIS — Z9181 History of falling: Secondary | ICD-10-CM | POA: Diagnosis not present

## 2020-04-18 DIAGNOSIS — R2681 Unsteadiness on feet: Secondary | ICD-10-CM | POA: Diagnosis not present

## 2020-04-18 DIAGNOSIS — M6281 Muscle weakness (generalized): Secondary | ICD-10-CM | POA: Diagnosis not present

## 2020-04-18 DIAGNOSIS — Z9181 History of falling: Secondary | ICD-10-CM | POA: Diagnosis not present

## 2020-04-21 DIAGNOSIS — Z9181 History of falling: Secondary | ICD-10-CM | POA: Diagnosis not present

## 2020-04-21 DIAGNOSIS — M6281 Muscle weakness (generalized): Secondary | ICD-10-CM | POA: Diagnosis not present

## 2020-04-21 DIAGNOSIS — R2681 Unsteadiness on feet: Secondary | ICD-10-CM | POA: Diagnosis not present

## 2020-04-23 DIAGNOSIS — Z9181 History of falling: Secondary | ICD-10-CM | POA: Diagnosis not present

## 2020-04-23 DIAGNOSIS — R2681 Unsteadiness on feet: Secondary | ICD-10-CM | POA: Diagnosis not present

## 2020-04-23 DIAGNOSIS — M6281 Muscle weakness (generalized): Secondary | ICD-10-CM | POA: Diagnosis not present

## 2020-04-25 DIAGNOSIS — Z9181 History of falling: Secondary | ICD-10-CM | POA: Diagnosis not present

## 2020-04-25 DIAGNOSIS — M6281 Muscle weakness (generalized): Secondary | ICD-10-CM | POA: Diagnosis not present

## 2020-04-25 DIAGNOSIS — R2681 Unsteadiness on feet: Secondary | ICD-10-CM | POA: Diagnosis not present

## 2020-04-28 DIAGNOSIS — R29898 Other symptoms and signs involving the musculoskeletal system: Secondary | ICD-10-CM | POA: Diagnosis not present

## 2020-04-28 DIAGNOSIS — Z9181 History of falling: Secondary | ICD-10-CM | POA: Diagnosis not present

## 2020-04-28 DIAGNOSIS — M6281 Muscle weakness (generalized): Secondary | ICD-10-CM | POA: Diagnosis not present

## 2020-04-28 DIAGNOSIS — R2681 Unsteadiness on feet: Secondary | ICD-10-CM | POA: Diagnosis not present

## 2020-04-30 DIAGNOSIS — R29898 Other symptoms and signs involving the musculoskeletal system: Secondary | ICD-10-CM | POA: Diagnosis not present

## 2020-04-30 DIAGNOSIS — Z9181 History of falling: Secondary | ICD-10-CM | POA: Diagnosis not present

## 2020-04-30 DIAGNOSIS — R2681 Unsteadiness on feet: Secondary | ICD-10-CM | POA: Diagnosis not present

## 2020-04-30 DIAGNOSIS — M6281 Muscle weakness (generalized): Secondary | ICD-10-CM | POA: Diagnosis not present

## 2020-05-02 DIAGNOSIS — M6281 Muscle weakness (generalized): Secondary | ICD-10-CM | POA: Diagnosis not present

## 2020-05-02 DIAGNOSIS — R29898 Other symptoms and signs involving the musculoskeletal system: Secondary | ICD-10-CM | POA: Diagnosis not present

## 2020-05-02 DIAGNOSIS — R2681 Unsteadiness on feet: Secondary | ICD-10-CM | POA: Diagnosis not present

## 2020-05-02 DIAGNOSIS — Z9181 History of falling: Secondary | ICD-10-CM | POA: Diagnosis not present

## 2020-05-05 DIAGNOSIS — Z9181 History of falling: Secondary | ICD-10-CM | POA: Diagnosis not present

## 2020-05-05 DIAGNOSIS — M6281 Muscle weakness (generalized): Secondary | ICD-10-CM | POA: Diagnosis not present

## 2020-05-05 DIAGNOSIS — R29898 Other symptoms and signs involving the musculoskeletal system: Secondary | ICD-10-CM | POA: Diagnosis not present

## 2020-05-05 DIAGNOSIS — R2681 Unsteadiness on feet: Secondary | ICD-10-CM | POA: Diagnosis not present

## 2020-05-07 DIAGNOSIS — R29898 Other symptoms and signs involving the musculoskeletal system: Secondary | ICD-10-CM | POA: Diagnosis not present

## 2020-05-07 DIAGNOSIS — M6281 Muscle weakness (generalized): Secondary | ICD-10-CM | POA: Diagnosis not present

## 2020-05-07 DIAGNOSIS — Z9181 History of falling: Secondary | ICD-10-CM | POA: Diagnosis not present

## 2020-05-07 DIAGNOSIS — R2681 Unsteadiness on feet: Secondary | ICD-10-CM | POA: Diagnosis not present

## 2020-05-09 DIAGNOSIS — M6281 Muscle weakness (generalized): Secondary | ICD-10-CM | POA: Diagnosis not present

## 2020-05-09 DIAGNOSIS — Z9181 History of falling: Secondary | ICD-10-CM | POA: Diagnosis not present

## 2020-05-09 DIAGNOSIS — R2681 Unsteadiness on feet: Secondary | ICD-10-CM | POA: Diagnosis not present

## 2020-05-09 DIAGNOSIS — R29898 Other symptoms and signs involving the musculoskeletal system: Secondary | ICD-10-CM | POA: Diagnosis not present

## 2020-05-12 DIAGNOSIS — Z9181 History of falling: Secondary | ICD-10-CM | POA: Diagnosis not present

## 2020-05-12 DIAGNOSIS — R29898 Other symptoms and signs involving the musculoskeletal system: Secondary | ICD-10-CM | POA: Diagnosis not present

## 2020-05-12 DIAGNOSIS — M6281 Muscle weakness (generalized): Secondary | ICD-10-CM | POA: Diagnosis not present

## 2020-05-12 DIAGNOSIS — R2681 Unsteadiness on feet: Secondary | ICD-10-CM | POA: Diagnosis not present

## 2020-05-14 DIAGNOSIS — R2681 Unsteadiness on feet: Secondary | ICD-10-CM | POA: Diagnosis not present

## 2020-05-14 DIAGNOSIS — R29898 Other symptoms and signs involving the musculoskeletal system: Secondary | ICD-10-CM | POA: Diagnosis not present

## 2020-05-14 DIAGNOSIS — Z9181 History of falling: Secondary | ICD-10-CM | POA: Diagnosis not present

## 2020-05-14 DIAGNOSIS — M6281 Muscle weakness (generalized): Secondary | ICD-10-CM | POA: Diagnosis not present

## 2020-05-16 DIAGNOSIS — R2681 Unsteadiness on feet: Secondary | ICD-10-CM | POA: Diagnosis not present

## 2020-05-16 DIAGNOSIS — Z9181 History of falling: Secondary | ICD-10-CM | POA: Diagnosis not present

## 2020-05-16 DIAGNOSIS — R29898 Other symptoms and signs involving the musculoskeletal system: Secondary | ICD-10-CM | POA: Diagnosis not present

## 2020-05-16 DIAGNOSIS — M6281 Muscle weakness (generalized): Secondary | ICD-10-CM | POA: Diagnosis not present

## 2020-05-19 DIAGNOSIS — M6281 Muscle weakness (generalized): Secondary | ICD-10-CM | POA: Diagnosis not present

## 2020-05-19 DIAGNOSIS — R2681 Unsteadiness on feet: Secondary | ICD-10-CM | POA: Diagnosis not present

## 2020-05-19 DIAGNOSIS — R29898 Other symptoms and signs involving the musculoskeletal system: Secondary | ICD-10-CM | POA: Diagnosis not present

## 2020-05-19 DIAGNOSIS — Z9181 History of falling: Secondary | ICD-10-CM | POA: Diagnosis not present

## 2020-05-22 DIAGNOSIS — M6281 Muscle weakness (generalized): Secondary | ICD-10-CM | POA: Diagnosis not present

## 2020-05-22 DIAGNOSIS — R29898 Other symptoms and signs involving the musculoskeletal system: Secondary | ICD-10-CM | POA: Diagnosis not present

## 2020-05-22 DIAGNOSIS — R2681 Unsteadiness on feet: Secondary | ICD-10-CM | POA: Diagnosis not present

## 2020-05-22 DIAGNOSIS — Z9181 History of falling: Secondary | ICD-10-CM | POA: Diagnosis not present

## 2020-05-23 DIAGNOSIS — M6281 Muscle weakness (generalized): Secondary | ICD-10-CM | POA: Diagnosis not present

## 2020-05-23 DIAGNOSIS — R2681 Unsteadiness on feet: Secondary | ICD-10-CM | POA: Diagnosis not present

## 2020-05-23 DIAGNOSIS — R29898 Other symptoms and signs involving the musculoskeletal system: Secondary | ICD-10-CM | POA: Diagnosis not present

## 2020-05-23 DIAGNOSIS — Z9181 History of falling: Secondary | ICD-10-CM | POA: Diagnosis not present

## 2020-05-26 DIAGNOSIS — R2681 Unsteadiness on feet: Secondary | ICD-10-CM | POA: Diagnosis not present

## 2020-05-26 DIAGNOSIS — R29898 Other symptoms and signs involving the musculoskeletal system: Secondary | ICD-10-CM | POA: Diagnosis not present

## 2020-05-26 DIAGNOSIS — M6281 Muscle weakness (generalized): Secondary | ICD-10-CM | POA: Diagnosis not present

## 2020-05-26 DIAGNOSIS — Z9181 History of falling: Secondary | ICD-10-CM | POA: Diagnosis not present

## 2020-05-28 DIAGNOSIS — Z9181 History of falling: Secondary | ICD-10-CM | POA: Diagnosis not present

## 2020-05-28 DIAGNOSIS — R2681 Unsteadiness on feet: Secondary | ICD-10-CM | POA: Diagnosis not present

## 2020-05-28 DIAGNOSIS — R29898 Other symptoms and signs involving the musculoskeletal system: Secondary | ICD-10-CM | POA: Diagnosis not present

## 2020-05-28 DIAGNOSIS — M6281 Muscle weakness (generalized): Secondary | ICD-10-CM | POA: Diagnosis not present

## 2020-05-30 DIAGNOSIS — R2681 Unsteadiness on feet: Secondary | ICD-10-CM | POA: Diagnosis not present

## 2020-05-30 DIAGNOSIS — Z9181 History of falling: Secondary | ICD-10-CM | POA: Diagnosis not present

## 2020-05-30 DIAGNOSIS — R29898 Other symptoms and signs involving the musculoskeletal system: Secondary | ICD-10-CM | POA: Diagnosis not present

## 2020-05-30 DIAGNOSIS — M6281 Muscle weakness (generalized): Secondary | ICD-10-CM | POA: Diagnosis not present

## 2020-06-02 DIAGNOSIS — R2681 Unsteadiness on feet: Secondary | ICD-10-CM | POA: Diagnosis not present

## 2020-06-02 DIAGNOSIS — M6281 Muscle weakness (generalized): Secondary | ICD-10-CM | POA: Diagnosis not present

## 2020-06-02 DIAGNOSIS — R29898 Other symptoms and signs involving the musculoskeletal system: Secondary | ICD-10-CM | POA: Diagnosis not present

## 2020-06-02 DIAGNOSIS — Z9181 History of falling: Secondary | ICD-10-CM | POA: Diagnosis not present

## 2020-06-04 DIAGNOSIS — Z9181 History of falling: Secondary | ICD-10-CM | POA: Diagnosis not present

## 2020-06-04 DIAGNOSIS — M6281 Muscle weakness (generalized): Secondary | ICD-10-CM | POA: Diagnosis not present

## 2020-06-04 DIAGNOSIS — R29898 Other symptoms and signs involving the musculoskeletal system: Secondary | ICD-10-CM | POA: Diagnosis not present

## 2020-06-04 DIAGNOSIS — R2681 Unsteadiness on feet: Secondary | ICD-10-CM | POA: Diagnosis not present

## 2020-06-05 DIAGNOSIS — R29898 Other symptoms and signs involving the musculoskeletal system: Secondary | ICD-10-CM | POA: Diagnosis not present

## 2020-06-05 DIAGNOSIS — R2681 Unsteadiness on feet: Secondary | ICD-10-CM | POA: Diagnosis not present

## 2020-06-05 DIAGNOSIS — Z9181 History of falling: Secondary | ICD-10-CM | POA: Diagnosis not present

## 2020-06-05 DIAGNOSIS — M6281 Muscle weakness (generalized): Secondary | ICD-10-CM | POA: Diagnosis not present

## 2020-06-07 ENCOUNTER — Other Ambulatory Visit: Payer: Self-pay | Admitting: Family Medicine

## 2020-06-09 NOTE — Telephone Encounter (Signed)
gabapentin (NEURONTIN) 300 MG capsule  [id number] [address], [place] Phone:  [phone]  Fax:  [phone]

## 2020-06-17 NOTE — Progress Notes (Signed)
Subjective:   Gabriel [name] is a 87 y.o. male who presents for Medicare Annual/Subsequent preventive examination.  I connected with [employer] today by telephone and verified that I am speaking with the correct person using two identifiers. Location patient: home Location provider: work Persons participating in the virtual visit: patient, provider.   I discussed the limitations, risks, security and privacy concerns of performing an evaluation and management service by telephone and the availability of in person appointments. I also discussed with the patient that there may be a patient responsible charge related to this service. The patient expressed understanding and verbally consented to this telephonic visit.    Interactive audio and video telecommunications were attempted between this provider and patient, however failed, due to patient having technical difficulties OR patient did not have access to video capability.  We continued and completed visit with audio only.       Review of Systems    N/A  Cardiac Risk Factors include: advanced age (>56men, >75 women);male gender;hypertension     Objective:    Today's Vitals   There is no height or weight on file to calculate BMI.  Advanced Directives 06/19/2020 02/21/2020 01/30/2018 10/20/2016 04/12/2016 10/17/2015  Does Patient Have a Medical Advance Directive? Yes No Yes Yes Yes Yes  Type of Paramedic of [place];Living will - - Living will Living will -  Does patient want to make changes to medical advance directive? No - Patient declined - - - No - Patient declined -  Copy of [place] in Chart? No - copy requested - - - - No - copy requested  Would patient like information on creating a medical advance directive? - No - Patient declined - - - -    Current Medications (verified) Outpatient Encounter Medications as of 06/19/2020  Medication Sig  . aspirin 81 MG tablet Take 81 mg by  mouth daily.  . hydrochlorothiazide (HYDRODIURIL) 25 MG tablet Take 1 tablet (25 mg total) by mouth every morning.  . megestrol (MEGACE) 20 MG tablet Take 1 tablet (20 mg total) by mouth 3 (three) times daily before meals.  . meloxicam (MOBIC) 15 MG tablet Take 0.5 tablets (7.5 mg total) by mouth daily.  . Multiple Vitamin (MULTIVITAMIN WITH MINERALS) TABS tablet Take 1 tablet by mouth every morning.  . pantoprazole (PROTONIX) 40 MG tablet Take 1 tablet (40 mg total) by mouth daily.  . potassium chloride (KLOR-CON 10) 10 MEQ tablet Take 1 tablet (10 mEq total) by mouth daily.  . vitamin C (ASCORBIC ACID) 500 MG tablet Take 500 mg by mouth daily. Reported on 10/27/2015  . gabapentin (NEURONTIN) 300 MG capsule Take 1 capsule (300 mg total) by mouth 3 (three) times daily.  . [DISCONTINUED] ondansetron (ZOFRAN) 4 MG tablet Take 1 tablet (4 mg total) by mouth every 6 (six) hours as needed for nausea. (Patient not taking: Reported on 06/19/2020)   No facility-administered encounter medications on file as of 06/19/2020.    Allergies (verified) Contrast media [iodinated diagnostic agents]   History: Past Medical History:  Diagnosis Date  . Arthritis   . Diverticulosis of colon   . GERD (gastroesophageal reflux disease)   . Hypertension    Past Surgical History:  Procedure Laterality Date  . cataract extaction, right  6/09   per Dr. [name]  . CATARACT EXTRACTION Left   . COLONOSCOPY  09/23/04   per Dr. [name], clear, no repeats needed   . EGD and esophageal  diatation,  05-10-11   per Dr. [name]    . HERNIA REPAIR    . rt inguinal hernia     x2   Family History  Problem Relation Age of Onset  . Coronary artery disease Father   . Colon cancer Neg Hx   . Stomach cancer Neg Hx   . Esophageal cancer Neg Hx    Social History   Socioeconomic History  . Marital status: Widowed    Spouse name: Not on file  . Number of children: 3  . Years of education: Not on file  . Highest education  level: Not on file  Occupational History  . Occupation: retired  Tobacco Use  . Smoking status: Former Smoker    Packs/day: 15.00    Years: 0.50    Pack years: 7.50    Types: Cigarettes  . Smokeless tobacco: Never Used  . Tobacco comment: smoked 18 and stopped and states he stopped 30's   Substance and Sexual Activity  . Alcohol use: No    Alcohol/week: 0.0 standard drinks  . Drug use: No  . Sexual activity: Never    Birth control/protection: Abstinence  Other Topics Concern  . Not on file  Social History Narrative   Lives in studio at [place]   3 children   Social Determinants of Health   Financial Resource Strain: Low Risk   . Difficulty of Paying Living Expenses: Not hard at all  Food Insecurity: No Food Insecurity  . Worried About Charity fundraiser in the Last Year: Never true  . Ran Out of Food in the Last Year: Never true  Transportation Needs: No Transportation Needs  . Lack of Transportation (Medical): No  . Lack of Transportation (Non-Medical): No  Physical Activity: Insufficiently Active  . Days of Exercise per Week: 7 days  . Minutes of Exercise per Session: 20 min  Stress: No Stress Concern Present  . Feeling of Stress : Not at all  Social Connections: Moderately Isolated  . Frequency of Communication with Friends and Family: More than three times a week  . Frequency of Social Gatherings with Friends and Family: More than three times a week  . Attends Religious Services: 1 to 4 times per year  . Active Member of Clubs or Organizations: No  . Attends Archivist Meetings: Never  . Marital Status: Widowed    Tobacco Counseling Counseling given: Not Answered Comment: smoked 18 and stopped and states he stopped 30's    Clinical Intake:  Pre-visit preparation completed: Yes  Pain : No/denies pain     Nutritional Risks: None Diabetes: No  How often do you need to have someone help you when you read instructions,  pamphlets, or other written materials from your doctor or pharmacy?: 1 - Never What is the last grade level you completed in school?: Some College  Diabetic?No   Interpreter Needed?: No  Information entered by :: [place] of Daily Living In your present state of health, do you have any difficulty performing the following activities: 06/19/2020  Hearing? Y  Comment Wears bilateral hearing aids  Vision? N  Difficulty concentrating or making decisions? N  Walking or climbing stairs? Y  Dressing or bathing? N  Doing errands, shopping? Y  Comment Patient no longer drives  Preparing Food and eating ? N  Using the Toilet? N  In the past six months, have you accidently leaked urine? N  Do you have problems with  loss of bowel control? N  Managing your Medications? N  Managing your Finances? N  Housekeeping or managing your Housekeeping? N  Some recent data might be hidden    Patient Care Team: Laurey Morale, MD as PCP - Evalina Field, MD as Consulting Physician (Ophthalmology) Zadie Rhine Clent Demark, MD as Consulting Physician (Ophthalmology)  Indicate any recent Medical Services you may have received from other than [facility] providers in the past year (date may be approximate).     Assessment:   This is a routine wellness examination for Jovan.  Hearing/Vision screen  Hearing Screening   125Hz  250Hz  500Hz  1000Hz  2000Hz  3000Hz  4000Hz  6000Hz  8000Hz   Right ear:           Left ear:           Vision Screening Comments: Patient states gets eye exams once per year.  Wears glasses   Dietary issues and exercise activities discussed: Current Exercise Habits: Home exercise routine, Type of exercise: stretching, Time (Minutes): 10, Frequency (Times/Week): 7, Weekly Exercise (Minutes/Week): 70, Intensity: Mild  Goals    .  Patient Stated      Keep exercising; and stay engaged in the community     .  Patient Stated      I want to continue to be independent and not have  to go to assistant living!    .  Prevent Falls (pt-stated)      Continue using walker and doing strength and balance exercises.      Depression Screen PHQ 2/9 Scores 06/19/2020 05/30/2019 09/29/2018 01/30/2018 10/20/2016 10/17/2015 09/23/2015  PHQ - 2 Score 0 0 0 0 0 0 0    Fall Risk Fall Risk  06/19/2020 05/30/2019 03/15/2018 01/30/2018 10/20/2016  Falls in the past year? 0 0 0 No Yes  Comment - - Emmi Telephone Survey: data to providers prior to load - -  Number falls in past yr: 0 - - - 2 or more  Injury with Fall? 0 - - - No  Risk for fall due to : Impaired balance/gait;History of fall(s) Medication side effect - - Impaired balance/gait;History of fall(s);Impaired mobility  Follow up Falls evaluation completed;Falls prevention discussed Education provided;Falls prevention discussed;Falls evaluation completed - - Falls prevention discussed    FALL RISK PREVENTION PERTAINING TO THE HOME:  Any stairs in or around the home? Yes  If so, are there any without handrails? No  Home free of loose throw rugs in walkways, pet beds, electrical cords, etc? Yes  Adequate lighting in your home to reduce risk of falls? Yes   ASSISTIVE DEVICES UTILIZED TO PREVENT FALLS:  Life alert? No  Use of a cane, walker or w/c? Yes  Grab bars in the bathroom? Yes  Shower chair or bench in shower? No  Elevated toilet seat or a handicapped toilet? Yes     Cognitive Function:  Normal cognitive status assessed by direct observation by this Nurse Health Advisor. No abnormalities found. ; MMSE - Mini Mental State Exam 01/30/2018 10/20/2016 10/17/2015  Not completed: (No Data) - -  Orientation to time - 4 5  Orientation to Place - 5 5  Registration - 3 3  Attention/ Calculation - 5 1  Recall - 3 3  Language- name 2 objects - 2 2  Language- repeat - 1 1  Language- follow 3 step command - 3 3  Language- read & follow direction - 1 1  Write a sentence - 1 1  Copy design - 1 1  Total score - 29 26     6CIT Screen  05/30/2019  What Year? 0 points  What month? 0 points  What time? 0 points  Count back from 20 0 points  Months in reverse 0 points  Repeat phrase 0 points  Total Score 0    Immunizations Immunization History  Administered Date(s) Administered  . Influenza Split 01/20/2012, 01/07/2013  . Influenza Whole 02/09/2006, 01/11/2008  . Influenza, High Dose Seasonal PF 12/30/2016, 12/27/2017, 01/08/2019  . Influenza,inj,quad, With Preservative 12/29/2018  . Influenza-Unspecified 01/17/2014, 01/14/2016, 12/30/2016  . Pneumococcal Conjugate-13 09/23/2015  . Pneumococcal Polysaccharide-23 03/13/2008  . Td 12/20/2007  . Unspecified SARS-COV-2 Vaccination 04/30/2019  . Zoster Recombinat (Shingrix) 03/11/2020    TDAP status: Due, Education has been provided regarding the importance of this vaccine. Advised may receive this vaccine at local pharmacy or Health Dept. Aware to provide a copy of the vaccination record if obtained from local pharmacy or Health Dept. Verbalized acceptance and understanding.  Flu Vaccine status: Due, Education has been provided regarding the importance of this vaccine. Advised may receive this vaccine at local pharmacy or Health Dept. Aware to provide a copy of the vaccination record if obtained from local pharmacy or Health Dept. Verbalized acceptance and understanding.  Pneumococcal vaccine status: Up to date  Covid-19 vaccine status: Completed vaccines  Qualifies for Shingles Vaccine? Yes   Zostavax completed No   Shingrix Completed?: Yes  Screening Tests Health Maintenance  Topic Date Due  . FOOT EXAM  Never done  . URINE MICROALBUMIN  Never done  . TETANUS/TDAP  12/19/2017  . OPHTHALMOLOGY EXAM  10/24/2018  . COVID-19 Vaccine (2 - Inadvertent 3-dose series) 05/28/2019  . HEMOGLOBIN A1C  04/01/2020  . INFLUENZA VACCINE  Completed  . PNA vac Low Risk Adult  Completed    Health Maintenance  Health Maintenance Due  Topic Date Due  . FOOT EXAM  Never  done  . URINE MICROALBUMIN  Never done  . TETANUS/TDAP  12/19/2017  . OPHTHALMOLOGY EXAM  10/24/2018  . COVID-19 Vaccine (2 - Inadvertent 3-dose series) 05/28/2019  . HEMOGLOBIN A1C  04/01/2020    Colorectal cancer screening: No longer required.   Lung Cancer Screening: (Low Dose CT Chest recommended if Age 76-80 years, 30 pack-year currently smoking OR have quit w/in 15years.) does not qualify.   Lung Cancer Screening Referral: N/A   Additional Screening:  Hepatitis C Screening: does not qualify;   Vision Screening: Recommended annual ophthalmology exams for early detection of glaucoma and other disorders of the eye. Is the patient up to date with their annual eye exam?  Yes  Who is the provider or what is the name of the office in which the patient attends annual eye exams? Dr. Katy Fitch If pt is not established with a provider, would they like to be referred to a provider to establish care? No .   Dental Screening: Recommended annual dental exams for proper oral hygiene  Community Resource Referral / Chronic Care Management: CRR required this visit?  No   CCM required this visit?  No      Plan:     I have personally reviewed and noted the following in the patient's chart:   . Medical and social history . Use of alcohol, tobacco or illicit drugs  . Current medications and supplements . Functional ability and status . Nutritional status . Physical activity . Advanced directives . List of other physicians . Hospitalizations, surgeries, and ER visits in previous 12 months .  Vitals . Screenings to include cognitive, depression, and falls . Referrals and appointments  In addition, I have reviewed and discussed with patient certain preventive protocols, quality metrics, and best practice recommendations. A written personalized care plan for preventive services as well as general preventive health recommendations were provided to patient.     Ofilia Neas,  LPN   08/31/5730   Nurse Notes: None

## 2020-06-19 ENCOUNTER — Ambulatory Visit (INDEPENDENT_AMBULATORY_CARE_PROVIDER_SITE_OTHER): Payer: Medicare Other

## 2020-06-19 DIAGNOSIS — Z0000 Encounter for general adult medical examination without abnormal findings: Secondary | ICD-10-CM | POA: Diagnosis not present

## 2020-06-19 NOTE — Patient Instructions (Signed)
Gabriel [name] , Thank you for taking time to come for your Medicare Wellness Visit. I appreciate your ongoing commitment to your health goals. Please review the following plan we discussed and let me know if I can assist you in the future.   Screening recommendations/referrals: Colonoscopy: No longer required  Recommended yearly ophthalmology/optometry visit for glaucoma screening and checkup Recommended yearly dental visit for hygiene and checkup  Vaccinations: Influenza vaccine: Up to date, next due fall 2022  Pneumococcal vaccine: Completed series  Tdap vaccine: Currently due , you may await and injury to receive or you may get at your pharmacy.  Shingles vaccine: Currently due for your second shingrix, you may receive at your pharmacy     Advanced directives: Please bring in a copy of your advanced medical directives so that we may scan into your chart.   Conditions/risks identified: None   Next appointment: 10/01/2020 @ 10:30 am with Dr. Sarajane Jews   Preventive Care 65 Years and Older, Male Preventive care refers to lifestyle choices and visits with your health care provider that can promote health and wellness. What does preventive care include?  A yearly physical exam. This is also called an annual well check.  Dental exams once or twice a year.  Routine eye exams. Ask your health care provider how often you should have your eyes checked.  Personal lifestyle choices, including:  Daily care of your teeth and gums.  Regular physical activity.  Eating a healthy diet.  Avoiding tobacco and drug use.  Limiting alcohol use.  Practicing safe sex.  Taking low doses of aspirin every day.  Taking vitamin and mineral supplements as recommended by your health care provider. What happens during an annual well check? The services and screenings done by your health care provider during your annual well check will depend on your age, overall health, lifestyle risk factors, and family  history of disease. Counseling  Your health care provider may ask you questions about your:  Alcohol use.  Tobacco use.  Drug use.  Emotional well-being.  Home and relationship well-being.  Sexual activity.  Eating habits.  History of falls.  Memory and ability to understand (cognition).  Work and work Statistician. Screening  You may have the following tests or measurements:  Height, weight, and BMI.  Blood pressure.  Lipid and cholesterol levels. These may be checked every 5 years, or more frequently if you are over 20 years old.  Skin check.  Lung cancer screening. You may have this screening every year starting at age 33 if you have a 30-pack-year history of smoking and currently smoke or have quit within the past 15 years.  Fecal occult blood test (FOBT) of the stool. You may have this test every year starting at age 15.  Flexible sigmoidoscopy or colonoscopy. You may have a sigmoidoscopy every 5 years or a colonoscopy every 10 years starting at age 25.  Prostate cancer screening. Recommendations will vary depending on your family history and other risks.  Hepatitis C blood test.  Hepatitis B blood test.  Sexually transmitted disease (STD) testing.  Diabetes screening. This is done by checking your blood sugar (glucose) after you have not eaten for a while (fasting). You may have this done every 1-3 years.  Abdominal aortic aneurysm (AAA) screening. You may need this if you are a current or former smoker.  Osteoporosis. You may be screened starting at age 62 if you are at high risk. Talk with your health care provider about your test  results, treatment options, and if necessary, the need for more tests. Vaccines  Your health care provider may recommend certain vaccines, such as:  Influenza vaccine. This is recommended every year.  Tetanus, diphtheria, and acellular pertussis (Tdap, Td) vaccine. You may need a Td booster every 10 years.  Zoster vaccine.  You may need this after age 3.  Pneumococcal 13-valent conjugate (PCV13) vaccine. One dose is recommended after age 71.  Pneumococcal polysaccharide (PPSV23) vaccine. One dose is recommended after age 60. Talk to your health care provider about which screenings and vaccines you need and how often you need them. This information is not intended to replace advice given to you by your health care provider. Make sure you discuss any questions you have with your health care provider. Document Released: 05/09/2015 Document Revised: 12/31/2015 Document Reviewed: 02/11/2015 Elsevier Interactive Patient Education  2017 [employer] Prevention in the Home Falls can cause injuries. They can happen to people of all ages. There are many things you can do to make your home safe and to help prevent falls. What can I do on the outside of my home?  Regularly fix the edges of walkways and driveways and fix any cracks.  Remove anything that might make you trip as you walk through a door, such as a raised step or threshold.  Trim any bushes or trees on the path to your home.  Use bright outdoor lighting.  Clear any walking paths of anything that might make someone trip, such as rocks or tools.  Regularly check to see if handrails are loose or broken. Make sure that both sides of any steps have handrails.  Any raised decks and porches should have guardrails on the edges.  Have any leaves, snow, or ice cleared regularly.  Use sand or salt on walking paths during winter.  Clean up any spills in your garage right away. This includes oil or grease spills. What can I do in the bathroom?  Use night lights.  Install grab bars by the toilet and in the tub and shower. Do not use towel bars as grab bars.  Use non-skid mats or decals in the tub or shower.  If you need to sit down in the shower, use a plastic, non-slip stool.  Keep the floor dry. Clean up any water that spills on the floor as soon  as it happens.  Remove soap buildup in the tub or shower regularly.  Attach bath mats securely with double-sided non-slip rug tape.  Do not have throw rugs and other things on the floor that can make you trip. What can I do in the bedroom?  Use night lights.  Make sure that you have a light by your bed that is easy to reach.  Do not use any sheets or blankets that are too big for your bed. They should not hang down onto the floor.  Have a firm chair that has side arms. You can use this for support while you get dressed.  Do not have throw rugs and other things on the floor that can make you trip. What can I do in the kitchen?  Clean up any spills right away.  Avoid walking on wet floors.  Keep items that you use a lot in easy-to-reach places.  If you need to reach something above you, use a strong step stool that has a grab bar.  Keep electrical cords out of the way.  Do not use floor polish or wax that makes  floors slippery. If you must use wax, use non-skid floor wax.  Do not have throw rugs and other things on the floor that can make you trip. What can I do with my stairs?  Do not leave any items on the stairs.  Make sure that there are handrails on both sides of the stairs and use them. Fix handrails that are broken or loose. Make sure that handrails are as long as the stairways.  Check any carpeting to make sure that it is firmly attached to the stairs. Fix any carpet that is loose or worn.  Avoid having throw rugs at the top or bottom of the stairs. If you do have throw rugs, attach them to the floor with carpet tape.  Make sure that you have a light switch at the top of the stairs and the bottom of the stairs. If you do not have them, ask someone to add them for you. What else can I do to help prevent falls?  Wear shoes that:  Do not have high heels.  Have rubber bottoms.  Are comfortable and fit you well.  Are closed at the toe. Do not wear sandals.  If  you use a stepladder:  Make sure that it is fully opened. Do not climb a closed stepladder.  Make sure that both sides of the stepladder are locked into place.  Ask someone to hold it for you, if possible.  Clearly mark and make sure that you can see:  Any grab bars or handrails.  First and last steps.  Where the edge of each step is.  Use tools that help you move around (mobility aids) if they are needed. These include:  Canes.  Walkers.  Scooters.  Crutches.  Turn on the lights when you go into a dark area. Replace any light bulbs as soon as they burn out.  Set up your furniture so you have a clear path. Avoid moving your furniture around.  If any of your floors are uneven, fix them.  If there are any pets around you, be aware of where they are.  Review your medicines with your doctor. Some medicines can make you feel dizzy. This can increase your chance of falling. Ask your doctor what other things that you can do to help prevent falls. This information is not intended to replace advice given to you by your health care provider. Make sure you discuss any questions you have with your health care provider. Document Released: 02/06/2009 Document Revised: 09/18/2015 Document Reviewed: 05/17/2014 Elsevier Interactive Patient Education  2017 [employer].

## 2020-07-17 ENCOUNTER — Encounter: Payer: Self-pay | Admitting: Family Medicine

## 2020-07-17 ENCOUNTER — Telehealth (INDEPENDENT_AMBULATORY_CARE_PROVIDER_SITE_OTHER): Payer: Medicare Other | Admitting: Family Medicine

## 2020-07-17 VITALS — Wt 153.0 lb

## 2020-07-17 DIAGNOSIS — J019 Acute sinusitis, unspecified: Secondary | ICD-10-CM | POA: Diagnosis not present

## 2020-07-17 MED ORDER — AZITHROMYCIN 250 MG PO TABS: ORAL_TABLET | ORAL | 0 refills | Status: DC

## 2020-07-17 NOTE — Progress Notes (Signed)
   Subjective:    Patient ID: Gabriel [name], male    DOB: 23-Feb-1925, 87 y.o.   MRN: [id number]  HPI Virtual Visit via Telephone Note  I connected with the patient on 07/17/20 at 10:45 AM EDT by telephone and verified that I am speaking with the correct person using two identifiers.   I discussed the limitations, risks, security and privacy concerns of performing an evaluation and management service by telephone and the availability of in person appointments. I also discussed with the patient that there may be a patient responsible charge related to this service. The patient expressed understanding and agreed to proceed.  Location patient: home Location provider: work or home office Participants present for the call: patient, provider Patient did not have a visit in the prior 7 days to address this/these issue(s).   History of Present Illness: Here for 5 days of sinus congestion, PND, ST, hoarseness, and a dry cough. No fever or SOB. No body aches or NVD. Drinking fluids and using lozenges.    Observations/Objective: Patient sounds cheerful and well on the phone. I do not appreciate any SOB. Speech and thought processing are grossly intact. Patient reported vitals:  Assessment and Plan: Sinusitis, treat with a Zpack. Alysia Penna, MD   Follow Up Instructions:     [phone] [phone] 11-20 [phone] 21-30 I did not refer this patient for an OV in the next 24 hours for this/these issue(s).  I discussed the assessment and treatment plan with the patient. The patient was provided an opportunity to ask questions and all were answered. The patient agreed with the plan and demonstrated an understanding of the instructions.   The patient was advised to call back or seek an in-person evaluation if the symptoms worsen or if the condition fails to improve as anticipated.  I provided 12 minutes of non-face-to-face time during this encounter.   Alysia Penna, MD    Review of Systems      Objective:   Physical Exam        Assessment & Plan:

## 2020-08-15 ENCOUNTER — Encounter: Payer: Self-pay | Admitting: Family Medicine

## 2020-08-15 ENCOUNTER — Other Ambulatory Visit: Payer: Self-pay

## 2020-08-15 ENCOUNTER — Ambulatory Visit (INDEPENDENT_AMBULATORY_CARE_PROVIDER_SITE_OTHER): Payer: Medicare Other | Admitting: Family Medicine

## 2020-08-15 VITALS — BP 120/64 | HR 74 | Temp 97.8°F | Wt 157.0 lb

## 2020-08-15 DIAGNOSIS — R1310 Dysphagia, unspecified: Secondary | ICD-10-CM | POA: Diagnosis not present

## 2020-08-15 NOTE — Progress Notes (Signed)
   Subjective:    Patient ID: Gabriel [name], male    DOB: 1925/02/05, 87 y.o.   MRN: [id number]  HPI Here for trouble swallowing. He has had 3 esophageal dilatations, in 2000 and 2005 and 2013. The last one was with Dr. [name]. 3 days ago he was eating fried chicken and a piece seemed to stop part way down. He drank water and coughed and after a few minutes it went down. No chest pain or SOB. Since then he has been eating soft foods like oatmeal or yogurt. No hoarseness.    Review of Systems  Constitutional: Negative.   HENT: Positive for trouble swallowing. Negative for sore throat and voice change.   Respiratory: Negative.   Cardiovascular: Negative.        Objective:   Physical Exam Constitutional:      Appearance: Normal appearance. He is not ill-appearing.  Cardiovascular:     Rate and Rhythm: Normal rate and regular rhythm.     Pulses: Normal pulses.     Heart sounds: Normal heart sounds.  Pulmonary:     Effort: Pulmonary effort is normal.     Breath sounds: Normal breath sounds.  Musculoskeletal:     Cervical back: No rigidity.  Lymphadenopathy:     Cervical: No cervical adenopathy.  Neurological:     Mental Status: He is alert.           Assessment & Plan:  Dysphagia, we will refer him to GI again. Alysia Penna, MD

## 2020-08-18 ENCOUNTER — Other Ambulatory Visit: Payer: Self-pay | Admitting: Family Medicine

## 2020-08-19 ENCOUNTER — Encounter: Payer: Self-pay | Admitting: Physician Assistant

## 2020-09-02 ENCOUNTER — Ambulatory Visit (INDEPENDENT_AMBULATORY_CARE_PROVIDER_SITE_OTHER): Payer: Medicare Other | Admitting: Physician Assistant

## 2020-09-02 ENCOUNTER — Encounter: Payer: Self-pay | Admitting: Physician Assistant

## 2020-09-02 ENCOUNTER — Other Ambulatory Visit: Payer: Self-pay

## 2020-09-02 VITALS — BP 156/68 | HR 67 | Ht 67.0 in | Wt 156.0 lb

## 2020-09-02 DIAGNOSIS — Z8719 Personal history of other diseases of the digestive system: Secondary | ICD-10-CM

## 2020-09-02 DIAGNOSIS — K219 Gastro-esophageal reflux disease without esophagitis: Secondary | ICD-10-CM | POA: Diagnosis not present

## 2020-09-02 DIAGNOSIS — R1314 Dysphagia, pharyngoesophageal phase: Secondary | ICD-10-CM

## 2020-09-02 NOTE — Progress Notes (Signed)
Chief Complaint: Dysphagia  HPI:    [name] is a 87 year old Caucasian male with past medical history as listed below including reflux and esophageal stricture, known to Dr. [name], who was referred to me by Laurey Morale, MD for a complaint of dysphagia.      05/10/2011 EGD with Dr. [name] with hiatal hernia and resultant foreshortened, tortuous distal esophagus, benign focal stricture at the GE junction dilated up to 18 mm and otherwise normal.  (Patient has had 2 prior EGDs within the past 5 to 8 years by different physician for dysphagia)    Today, the patient tells me that over the past 6 to 8 months he has noticed an increase in difficulty swallowing.  Tells me that he had an event back about a month ago where he swallowed a piece of fried chicken and it got stuck and just would not go down for a while.  Tells me this is happened before and he needed to have his esophagus stretched.  He is hoping that Dr. [name] can do this for him again.  He has modified his diet now to just soft foods which he seems to be tolerating okay.  Associated symptoms include an increase in eructations.  Denies any overt heartburn or reflux symptoms on his Pantoprazole 40 mg daily.    Denies fever, chills, weight loss, change in bowel habits or abdominal pain  Past Medical History:  Diagnosis Date  . Arthritis   . Diverticulosis of colon   . GERD (gastroesophageal reflux disease)   . Hypertension     Past Surgical History:  Procedure Laterality Date  . cataract extaction, right  6/09   per Dr. [name]  . CATARACT EXTRACTION Left   . COLONOSCOPY  09/23/04   per Dr. [name], clear, no repeats needed   . EGD and esophageal diatation,  05-10-11   per Dr. [name]    . HERNIA REPAIR    . rt inguinal hernia     x2    Current Outpatient Medications  Medication Sig Dispense Refill  . aspirin 81 MG tablet Take 81 mg by mouth daily.    Marland Kitchen gabapentin (NEURONTIN) 300 MG capsule TAKE 1 CAPSULE BY MOUTH THREE  TIMES A DAY 270 capsule 1  . hydrochlorothiazide (HYDRODIURIL) 25 MG tablet Take 1 tablet (25 mg total) by mouth every morning. 90 tablet 3  . meloxicam (MOBIC) 15 MG tablet Take 0.5 tablets (7.5 mg total) by mouth daily. 90 tablet 3  . Multiple Vitamin (MULTIVITAMIN WITH MINERALS) TABS tablet Take 1 tablet by mouth every morning.    . pantoprazole (PROTONIX) 40 MG tablet Take 1 tablet (40 mg total) by mouth daily. 90 tablet 3  . potassium chloride (KLOR-CON 10) 10 MEQ tablet Take 1 tablet (10 mEq total) by mouth daily. 90 tablet 3  . vitamin C (ASCORBIC ACID) 500 MG tablet Take 500 mg by mouth daily. Reported on 10/27/2015     No current facility-administered medications for this visit.    Allergies as of 09/02/2020 - Review Complete 09/02/2020  Allergen Reaction Noted  . Contrast media [iodinated diagnostic agents] Rash 09/12/2013    Family History  Problem Relation Age of Onset  . Coronary artery disease Father   . Colon cancer Neg Hx   . Stomach cancer Neg Hx   . Esophageal cancer Neg Hx     Social History   Socioeconomic History  . Marital status: Widowed    Spouse name: Not on file  .  Number of children: 3  . Years of education: Not on file  . Highest education level: Not on file  Occupational History  . Occupation: retired  Tobacco Use  . Smoking status: Former Smoker    Packs/day: 15.00    Years: 0.50    Pack years: 7.50    Types: Cigarettes  . Smokeless tobacco: Never Used  . Tobacco comment: smoked 18 and stopped and states he stopped 30's   Substance and Sexual Activity  . Alcohol use: No    Alcohol/week: 0.0 standard drinks  . Drug use: No  . Sexual activity: Never    Birth control/protection: Abstinence  Other Topics Concern  . Not on file  Social History Narrative   Lives in studio at [place]   3 children   Social Determinants of Health   Financial Resource Strain: Low Risk   . Difficulty of Paying Living Expenses: Not hard at all   Food Insecurity: No Food Insecurity  . Worried About Charity fundraiser in the Last Year: Never true  . Ran Out of Food in the Last Year: Never true  Transportation Needs: No Transportation Needs  . Lack of Transportation (Medical): No  . Lack of Transportation (Non-Medical): No  Physical Activity: Insufficiently Active  . Days of Exercise per Week: 7 days  . Minutes of Exercise per Session: 20 min  Stress: No Stress Concern Present  . Feeling of Stress : Not at all  Social Connections: Moderately Isolated  . Frequency of Communication with Friends and Family: More than three times a week  . Frequency of Social Gatherings with Friends and Family: More than three times a week  . Attends Religious Services: 1 to 4 times per year  . Active Member of Clubs or Organizations: No  . Attends Archivist Meetings: Never  . Marital Status: Widowed  Intimate Partner Violence: Not At Risk  . Fear of Current or Ex-Partner: No  . Emotionally Abused: No  . Physically Abused: No  . Sexually Abused: No    Review of Systems:    Constitutional: No weight loss, fever or chills Skin: No rash  Cardiovascular: No chest pain Respiratory: No SOB Gastrointestinal: See HPI and otherwise negative Genitourinary: No dysuria  Neurological: No headache Musculoskeletal: No new muscle or joint pain Hematologic: No bleeding Psychiatric: No history of depression or anxiety   Physical Exam:  Vital signs: BP (!) 156/68   Pulse 67   Ht 5\' 7"  (1.702 m)   Wt 156 lb (70.8 kg)   BMI 24.43 kg/m   Constitutional:   Pleasant elderly Caucasian male appears to be in NAD, Well developed, Well nourished, alert and cooperative Head:  Normocephalic and atraumatic. Eyes:   PEERL, EOMI. No icterus. Conjunctiva pink. Ears:  Normal auditory acuity. Neck:  Supple Throat: Oral cavity and pharynx without inflammation, swelling or lesion.  Respiratory: Respirations even and unlabored. Lungs clear to auscultation  bilaterally.   No wheezes, crackles, or rhonchi.  Cardiovascular: Normal S1, S2. No MRG. Regular rate and rhythm. No peripheral edema, cyanosis or pallor.  Gastrointestinal:  Soft, nondistended, nontender. No rebound or guarding. Normal bowel sounds. No appreciable masses or hepatomegaly. Rectal:  Not performed.  Msk:  Symmetrical without gross deformities. Without edema, no deformity or joint abnormality. + Walks with a walker Neurologic:  Alert and  oriented x4;  grossly normal neurologically.  Skin:   Dry and intact without significant lesions or rashes. Psychiatric: Oriented to person,  place and time. Demonstrates good judgement and reason without abnormal affect or behaviors.  No recent labs or imaging  Assessment: 1.  Dysphagia: History of esophageal stricture dilated x3 the last in 2013; most likely stricture 2.  GERD: Controlled on Pantoprazole 40 mg daily  Plan: 1.  Continue Pantoprazole 40 mg daily, 30-60 minutes before breakfast. 2.  Reviewed anti-dysphagia measures. 3.  Scheduled the patient for an EGD with dilation with Dr. [name] in the [facility].  Did offer him a sooner appointment but patient would like to stay with Dr. [name].  Did provide the patient with a detailed list of risks for the procedure and he agrees to proceed.  Did also explain that due to his increasing age that these risks are slightly higher for him. 4.  Patient to follow in clinic per recommendations from Dr. [name] after time of procedure.  Ellouise Newer, PA-C [facility] 09/02/2020, 3:13 PM  Cc: Laurey Morale, MD

## 2020-09-02 NOTE — Patient Instructions (Signed)
If you are age 85 or older, your body mass index should be between 23-30. Your Body mass index is 24.43 kg/m. If this is out of the aforementioned range listed, please consider follow up with your Primary Care Provider.  If you are age 33 or younger, your body mass index should be between 19-25. Your Body mass index is 24.43 kg/m. If this is out of the aformentioned range listed, please consider follow up with your Primary Care Provider.   You have been scheduled for an endoscopy. Please follow written instructions given to you at your visit today. If you use inhalers (even only as needed), please bring them with you on the day of your procedure.  Please continue Pantoprazole.  Due to recent changes in healthcare laws, you may see the results of your imaging and laboratory studies on MyChart before your provider has had a chance to review them.  We understand that in some cases there may be results that are confusing or concerning to you. Not all laboratory results come back in the same time frame and the provider may be waiting for multiple results in order to interpret others.  Please give [place] 48 hours in order for your provider to thoroughly review all the results before contacting the office for clarification of your results.   Thank you for choosing me and [facility].  Ellouise Newer, PA-C

## 2020-09-03 NOTE — Progress Notes (Signed)
I agree with the above note, plan 

## 2020-09-05 ENCOUNTER — Other Ambulatory Visit: Payer: Self-pay

## 2020-09-05 ENCOUNTER — Encounter: Payer: Self-pay | Admitting: Gastroenterology

## 2020-09-05 ENCOUNTER — Ambulatory Visit (AMBULATORY_SURGERY_CENTER): Payer: Medicare Other | Admitting: Gastroenterology

## 2020-09-05 VITALS — BP 112/64 | HR 60 | Temp 97.1°F | Resp 20 | Ht 67.0 in | Wt 156.0 lb

## 2020-09-05 DIAGNOSIS — K449 Diaphragmatic hernia without obstruction or gangrene: Secondary | ICD-10-CM | POA: Diagnosis not present

## 2020-09-05 DIAGNOSIS — R1310 Dysphagia, unspecified: Secondary | ICD-10-CM | POA: Diagnosis not present

## 2020-09-05 DIAGNOSIS — K222 Esophageal obstruction: Secondary | ICD-10-CM

## 2020-09-05 DIAGNOSIS — I10 Essential (primary) hypertension: Secondary | ICD-10-CM | POA: Diagnosis not present

## 2020-09-05 DIAGNOSIS — K219 Gastro-esophageal reflux disease without esophagitis: Secondary | ICD-10-CM | POA: Diagnosis not present

## 2020-09-05 DIAGNOSIS — R1314 Dysphagia, pharyngoesophageal phase: Secondary | ICD-10-CM

## 2020-09-05 MED ORDER — SODIUM CHLORIDE 0.9 % IV SOLN: 500.0000 mL | Freq: Once | INTRAVENOUS | Status: DC

## 2020-09-05 NOTE — Op Note (Signed)
[facility] Patient Name: Gabriel [name] Procedure Date: 09/05/2020 9:20 AM MRN: [id number] Endoscopist: Milus Banister , MD Age: 87 Referring MD:  Date of Birth: May 07, 1924 Gender: Male Account #: [id number] Procedure:                Upper GI endoscopy Indications:              Dysphagia; EGD 2013 dilated benign GE junction                            stricture to 70mm Medicines:                Monitored Anesthesia Care Procedure:                Pre-Anesthesia Assessment:                           - Prior to the procedure, a History and Physical                            was performed, and patient medications and                            allergies were reviewed. The patient's tolerance of                            previous anesthesia was also reviewed. The risks                            and benefits of the procedure and the sedation                            options and risks were discussed with the patient.                            All questions were answered, and informed consent                            was obtained. Prior Anticoagulants: The patient has                            taken no previous anticoagulant or antiplatelet                            agents. ASA Grade Assessment: III - A patient with                            severe systemic disease. After reviewing the risks                            and benefits, the patient was deemed in                            satisfactory condition to undergo the procedure.  After obtaining informed consent, the endoscope was                            passed under direct vision. Throughout the                            procedure, the patient's blood pressure, pulse, and                            oxygen saturations were monitored continuously. The                            Endoscope was introduced through the mouth, and                            advanced to the second part of duodenum.  The upper                            GI endoscopy was accomplished without difficulty.                            The patient tolerated the procedure well. Scope In: Scope Out: Findings:                 One benign-appearing, intrinsic moderate stenosis                            was found at the gastroesophageal junction (focal                            peptic stricture vs. thick schatzki's type ring).                            This stenosis measured about 8 mm (inner diameter).                            Dilation with a 13.5-14.5-15.5 mm balloon dilator                            was performed to 15.5 mm with typical superficial                            mucosal disruption and mild bleeding.                           A medium-sized hiatal hernia was present. This                            causes usual esophageal foreshortening and distal                            tortuosity.                           The exam was otherwise without abnormality.  Complications:            No immediate complications. Estimated blood loss:                            None. Estimated Blood Loss:     Estimated blood loss: none. Impression:               - Benign-appearing esophageal stenosis (focal                            peptic stricture vs. thick schatzki's type ring).                            This was dilated to 15.50mm.                           - Medium-sized hiatal hernia.                           - The examination was otherwise normal. Recommendation:           - Patient has a contact number available for                            emergencies. The signs and symptoms of potential                            delayed complications were discussed with the                            patient. Return to normal activities tomorrow.                            Written discharge instructions were provided to the                            patient.                           - Post dilation diet today.                            - Continue present medications.                           - Please call Dr. [name]' office in 2 weeks to                            report on your response to this dilation. Milus Banister, MD 09/05/2020 9:34:48 AM This report has been signed electronically.

## 2020-09-05 NOTE — Progress Notes (Signed)
Called to room to assist during endoscopic procedure.  Patient ID and intended procedure confirmed with present staff. Received instructions for my participation in the procedure from the performing physician.  

## 2020-09-05 NOTE — Progress Notes (Signed)
VS taken by C.W. 

## 2020-09-05 NOTE — Patient Instructions (Signed)
YOU HAD AN ENDOSCOPIC PROCEDURE TODAY AT THE [facility]:   Refer to the procedure report that was given to you for any specific questions about what was found during the examination.  If the procedure report does not answer your questions, please call your gastroenterologist to clarify.  If you requested that your care partner not be given the details of your procedure findings, then the procedure report has been included in a sealed envelope for you to review at your convenience later.  YOU SHOULD EXPECT: Some feelings of bloating in the abdomen. Passage of more gas than usual.  Walking can help get rid of the air that was put into your GI tract during the procedure and reduce the bloating. If you had a lower endoscopy (such as a colonoscopy or flexible sigmoidoscopy) you may notice spotting of blood in your stool or on the toilet paper. If you underwent a bowel prep for your procedure, you may not have a normal bowel movement for a few days.  Please Note:  You might notice some irritation and congestion in your nose or some drainage.  This is from the oxygen used during your procedure.  There is no need for concern and it should clear up in a day or so.  SYMPTOMS TO REPORT IMMEDIATELY:   Following lower endoscopy (colonoscopy or flexible sigmoidoscopy):  Excessive amounts of blood in the stool  Significant tenderness or worsening of abdominal pains  Swelling of the abdomen that is new, acute  Fever of 100F or higher   Following upper endoscopy (EGD)  Vomiting of blood or coffee ground material  New chest pain or pain under the shoulder blades  Painful or persistently difficult swallowing  New shortness of breath  Fever of 100F or higher  Black, tarry-looking stools  For urgent or emergent issues, a gastroenterologist can be reached at any hour by calling [phone]. Do not use MyChart messaging for urgent concerns.    DIET:  We do recommend a small meal at first, but  then you may proceed to your regular diet.  Drink plenty of fluids but you should avoid alcoholic beverages for 24 hours.  ACTIVITY:  You should plan to take it easy for the rest of today and you should NOT DRIVE or use heavy machinery until tomorrow (because of the sedation medicines used during the test).    FOLLOW UP: Our staff will call the number listed on your records 48-72 hours following your procedure to check on you and address any questions or concerns that you may have regarding the information given to you following your procedure. If we do not reach you, we will leave a message.  We will attempt to reach you two times.  During this call, we will ask if you have developed any symptoms of COVID 19. If you develop any symptoms (ie: fever, flu-like symptoms, shortness of breath, cough etc.) before then, please call [phone].  If you test positive for Covid 19 in the 2 weeks post procedure, please call and report this information to us.    If any biopsies were taken you will be contacted by phone or by letter within the next 1-3 weeks.  Please call us at [phone] if you have not heard about the biopsies in 3 weeks.    SIGNATURES/CONFIDENTIALITY: You and/or your care partner have signed paperwork which will be entered into your electronic medical record.  These signatures attest to the fact that that the information above on   your After Visit Summary has been reviewed and is understood.  Full responsibility of the confidentiality of this discharge information lies with you and/or your care-partner. 

## 2020-09-05 NOTE — Progress Notes (Signed)
Report to PACU, RN, vss, BBS= Clear.  

## 2020-09-09 ENCOUNTER — Telehealth: Payer: Self-pay | Admitting: *Deleted

## 2020-09-09 NOTE — Telephone Encounter (Signed)
  Follow up Call-  Call back number 09/05/2020  Post procedure Call Back phone  # [phone]  Permission to leave phone message Yes  Some recent data might be hidden     Patient questions:  Do you have a fever, pain , or abdominal swelling? No. Pain Score  0 *  Have you tolerated food without any problems? Yes.    Have you been able to return to your normal activities? Yes.    Do you have any questions about your discharge instructions: Diet   No. Medications  No. Follow up visit  No.  Do you have questions or concerns about your Care? No.  Actions: * If pain score is 4 or above: No action needed, pain <4.  1. Have you developed a fever since your procedure? no  2.   Have you had an respiratory symptoms (SOB or cough) since your procedure? no  3.   Have you tested positive for COVID 19 since your procedure no  4.   Have you had any family members/close contacts diagnosed with the COVID 19 since your procedure? no   If yes to any of these questions please route to Joylene John, RN and Joella Prince, RN

## 2020-09-19 ENCOUNTER — Telehealth: Payer: Self-pay | Admitting: Gastroenterology

## 2020-09-19 NOTE — Telephone Encounter (Signed)
Good morning,  Patient returning missed call. Patient states he is feeling well, Patient said Thank you for the call.Gabriel [name]

## 2020-09-23 DIAGNOSIS — Z23 Encounter for immunization: Secondary | ICD-10-CM | POA: Diagnosis not present

## 2020-10-01 ENCOUNTER — Ambulatory Visit (INDEPENDENT_AMBULATORY_CARE_PROVIDER_SITE_OTHER): Payer: Medicare Other | Admitting: Family Medicine

## 2020-10-01 ENCOUNTER — Other Ambulatory Visit: Payer: Self-pay

## 2020-10-01 ENCOUNTER — Encounter: Payer: Self-pay | Admitting: Family Medicine

## 2020-10-01 VITALS — BP 138/60 | HR 76 | Temp 98.3°F | Wt 158.8 lb

## 2020-10-01 DIAGNOSIS — R739 Hyperglycemia, unspecified: Secondary | ICD-10-CM

## 2020-10-01 DIAGNOSIS — N138 Other obstructive and reflux uropathy: Secondary | ICD-10-CM | POA: Diagnosis not present

## 2020-10-01 DIAGNOSIS — M159 Polyosteoarthritis, unspecified: Secondary | ICD-10-CM

## 2020-10-01 DIAGNOSIS — R1314 Dysphagia, pharyngoesophageal phase: Secondary | ICD-10-CM

## 2020-10-01 DIAGNOSIS — R001 Bradycardia, unspecified: Secondary | ICD-10-CM | POA: Diagnosis not present

## 2020-10-01 DIAGNOSIS — K219 Gastro-esophageal reflux disease without esophagitis: Secondary | ICD-10-CM

## 2020-10-01 DIAGNOSIS — I10 Essential (primary) hypertension: Secondary | ICD-10-CM | POA: Diagnosis not present

## 2020-10-01 DIAGNOSIS — G609 Hereditary and idiopathic neuropathy, unspecified: Secondary | ICD-10-CM | POA: Diagnosis not present

## 2020-10-01 DIAGNOSIS — M8949 Other hypertrophic osteoarthropathy, multiple sites: Secondary | ICD-10-CM

## 2020-10-01 DIAGNOSIS — N401 Enlarged prostate with lower urinary tract symptoms: Secondary | ICD-10-CM | POA: Diagnosis not present

## 2020-10-01 LAB — HEMOGLOBIN A1C: Hgb A1c MFr Bld: 5.5 % (ref 4.6–6.5)

## 2020-10-01 LAB — CBC WITH DIFFERENTIAL/PLATELET
Basophils Absolute: 0.1 10*3/uL (ref 0.0–0.1)
Basophils Relative: 1 % (ref 0.0–3.0)
Eosinophils Absolute: 0.2 10*3/uL (ref 0.0–0.7)
Eosinophils Relative: 1.8 % (ref 0.0–5.0)
HCT: 44.8 % (ref 39.0–52.0)
Hemoglobin: 15.4 g/dL (ref 13.0–17.0)
Lymphocytes Relative: 17.4 % (ref 12.0–46.0)
Lymphs Abs: 2.1 10*3/uL (ref 0.7–4.0)
MCHC: 34.3 g/dL (ref 30.0–36.0)
MCV: 90.7 fl (ref 78.0–100.0)
Monocytes Absolute: 1 10*3/uL (ref 0.1–1.0)
Monocytes Relative: 7.8 % (ref 3.0–12.0)
Neutro Abs: 8.9 10*3/uL — ABNORMAL HIGH (ref 1.4–7.7)
Neutrophils Relative %: 72 % (ref 43.0–77.0)
Platelets: 306 10*3/uL (ref 150.0–400.0)
RBC: 4.94 Mil/uL (ref 4.22–5.81)
RDW: 13.9 % (ref 11.5–15.5)
WBC: 12.3 10*3/uL — ABNORMAL HIGH (ref 4.0–10.5)

## 2020-10-01 LAB — LIPID PANEL
Cholesterol: 147 mg/dL (ref 0–200)
HDL: 38.1 mg/dL — ABNORMAL LOW (ref >39.00)
LDL Cholesterol: 71 mg/dL (ref 0–99)
NonHDL: 109.16
Total CHOL/HDL Ratio: 4
Triglycerides: 190 mg/dL — ABNORMAL HIGH (ref 0.0–149.0)
VLDL: 38 mg/dL (ref 0.0–40.0)

## 2020-10-01 LAB — HEPATIC FUNCTION PANEL
ALT: 28 U/L (ref 0–53)
AST: 28 U/L (ref 0–37)
Albumin: 4 g/dL (ref 3.5–5.2)
Alkaline Phosphatase: 90 U/L (ref 39–117)
Bilirubin, Direct: 0.3 mg/dL (ref 0.0–0.3)
Total Bilirubin: 1.4 mg/dL — ABNORMAL HIGH (ref 0.2–1.2)
Total Protein: 6.8 g/dL (ref 6.0–8.3)

## 2020-10-01 LAB — BASIC METABOLIC PANEL
BUN: 17 mg/dL (ref 6–23)
CO2: 28 mEq/L (ref 19–32)
Calcium: 9.6 mg/dL (ref 8.4–10.5)
Chloride: 96 mEq/L (ref 96–112)
Creatinine, Ser: 0.6 mg/dL (ref 0.40–1.50)
GFR: 82.93 mL/min (ref >60.00)
Glucose, Bld: 99 mg/dL (ref 70–99)
Potassium: 4.1 mEq/L (ref 3.5–5.1)
Sodium: 132 mEq/L — ABNORMAL LOW (ref 135–145)

## 2020-10-01 LAB — TSH: TSH: 3.84 u[IU]/mL (ref 0.35–4.50)

## 2020-10-01 LAB — T3, FREE: T3, Free: 3 pg/mL (ref 2.3–4.2)

## 2020-10-01 LAB — T4, FREE: Free T4: 1.03 ng/dL (ref 0.60–1.60)

## 2020-10-01 NOTE — Addendum Note (Signed)
Addended by: Amanda Cockayne on: 10/01/2020 11:34 AM   Modules accepted: Orders

## 2020-10-01 NOTE — Progress Notes (Signed)
Subjective:    Patient ID: Gabriel [name], male    DOB: Feb 19, 1925, 87 y.o.   MRN: [id number]  HPI Here with his daughter to follow up on issues. Three weeks ago he underwent upper endoscopy and had a stricture dilated by Dr. [name]. This went well and he can swallow normally now. His arthritis is stable on Meloxicam. His BP is well controlled.    Review of Systems  Constitutional: Negative.   HENT: Negative.   Eyes: Negative.   Respiratory: Negative.   Cardiovascular: Negative.   Gastrointestinal: Negative.   Genitourinary: Negative.   Musculoskeletal: Positive for arthralgias.  Skin: Negative.   Neurological: Positive for numbness.  Psychiatric/Behavioral: Negative.        Objective:   Physical Exam Constitutional:      General: He is not in acute distress.    Appearance: He is well-developed. He is not diaphoretic.     Comments: He walks with a walker   HENT:     Head: Normocephalic and atraumatic.     Right Ear: External ear normal.     Left Ear: External ear normal.     Nose: Nose normal.     Mouth/Throat:     Pharynx: No oropharyngeal exudate.  Eyes:     General: No scleral icterus.       Right eye: No discharge.        Left eye: No discharge.     Conjunctiva/sclera: Conjunctivae normal.     Pupils: Pupils are equal, round, and reactive to light.  Neck:     Thyroid: No thyromegaly.     Vascular: No JVD.     Trachea: No tracheal deviation.  Cardiovascular:     Rate and Rhythm: Normal rate and regular rhythm.     Heart sounds: Normal heart sounds. No murmur heard. No friction rub. No gallop.   Pulmonary:     Effort: Pulmonary effort is normal. No respiratory distress.     Breath sounds: Normal breath sounds. No wheezing or rales.  Chest:     Chest wall: No tenderness.  Abdominal:     General: Bowel sounds are normal. There is no distension.     Palpations: Abdomen is soft. There is no mass.     Tenderness: There is no abdominal tenderness. There is no  guarding or rebound.  Genitourinary:    Penis: Normal. No tenderness.      Testes: Normal.  Musculoskeletal:        General: No tenderness. Normal range of motion.     Cervical back: Neck supple.  Lymphadenopathy:     Cervical: No cervical adenopathy.  Skin:    General: Skin is warm and dry.     Coloration: Skin is not pale.     Findings: No erythema or rash.  Neurological:     Mental Status: He is alert and oriented to person, place, and time.     Cranial Nerves: No cranial nerve deficit.     Motor: No abnormal muscle tone.     Coordination: Coordination normal.     Deep Tendon Reflexes: Reflexes are normal and symmetric. Reflexes normal.  Psychiatric:        Behavior: Behavior normal.        Thought Content: Thought content normal.        Judgment: Judgment normal.           Assessment & Plan:  He is doing well overall. His HTN and OA and GERD are stable.  His neuropathy is stable. We wil get fasting labs to check lipids, A1c, etc. We spent 35 minutes today discussing these issues.  Alysia Penna, MD

## 2020-11-15 ENCOUNTER — Other Ambulatory Visit: Payer: Self-pay | Admitting: Family Medicine

## 2020-11-17 ENCOUNTER — Telehealth: Payer: Self-pay | Admitting: Family Medicine

## 2020-11-17 NOTE — Telephone Encounter (Signed)
Rx was sent in. 

## 2020-11-17 NOTE — Telephone Encounter (Signed)
Please refill this for one year  

## 2020-11-17 NOTE — Telephone Encounter (Signed)
Patient called stating that he has four prescriptions at the pharmacy and was told that he has one more prescription waiting to be refilled. He says that he is needing Dr. Sarajane Jews to send in a refill for  meloxicam (MOBIC) 15 MG tablet to be sent to [id number] [place] [place] [address]. He says that he plans to have his daughter pick up his prescriptions on Wednesday when he has a dentist's appointment.   Please advise.

## 2020-11-17 NOTE — Telephone Encounter (Signed)
Okay for refill? Please advise 

## 2020-11-28 ENCOUNTER — Emergency Department (HOSPITAL_BASED_OUTPATIENT_CLINIC_OR_DEPARTMENT_OTHER): Payer: Medicare Other

## 2020-11-28 ENCOUNTER — Encounter (HOSPITAL_BASED_OUTPATIENT_CLINIC_OR_DEPARTMENT_OTHER): Payer: Self-pay | Admitting: Emergency Medicine

## 2020-11-28 ENCOUNTER — Emergency Department (HOSPITAL_BASED_OUTPATIENT_CLINIC_OR_DEPARTMENT_OTHER)
Admission: EM | Admit: 2020-11-28 | Discharge: 2020-11-28 | Disposition: A | Discharge status: Home / Self Care | Payer: Medicare Other | Attending: Emergency Medicine | Admitting: Emergency Medicine

## 2020-11-28 ENCOUNTER — Other Ambulatory Visit: Payer: Self-pay

## 2020-11-28 DIAGNOSIS — R600 Localized edema: Secondary | ICD-10-CM | POA: Insufficient documentation

## 2020-11-28 DIAGNOSIS — Z87891 Personal history of nicotine dependence: Secondary | ICD-10-CM | POA: Insufficient documentation

## 2020-11-28 DIAGNOSIS — Z7982 Long term (current) use of aspirin: Secondary | ICD-10-CM | POA: Insufficient documentation

## 2020-11-28 DIAGNOSIS — I10 Essential (primary) hypertension: Secondary | ICD-10-CM | POA: Diagnosis not present

## 2020-11-28 DIAGNOSIS — W19XXXA Unspecified fall, initial encounter: Secondary | ICD-10-CM | POA: Diagnosis not present

## 2020-11-28 DIAGNOSIS — M7121 Synovial cyst of popliteal space [Baker], right knee: Secondary | ICD-10-CM | POA: Diagnosis not present

## 2020-11-28 DIAGNOSIS — Z79899 Other long term (current) drug therapy: Secondary | ICD-10-CM | POA: Diagnosis not present

## 2020-11-28 DIAGNOSIS — M79671 Pain in right foot: Secondary | ICD-10-CM | POA: Diagnosis not present

## 2020-11-28 DIAGNOSIS — M7989 Other specified soft tissue disorders: Secondary | ICD-10-CM | POA: Diagnosis not present

## 2020-11-28 NOTE — ED Provider Notes (Signed)
[facility] EMERGENCY DEPT Provider Note   CSN: [id number] Arrival date & time: 11/28/20  1026     History Chief Complaint  Patient presents with   Foot Injury    Gabriel [name] is a 87 y.o. male.  HPI  87 year old male presents to the emergency department after mechanical fall and right foot pain.  Patient states that this happened just about a week ago.  It was a mechanical fall where he feels like he rolled his ankle.  Since then he has been having foot pain and swelling of the lower extremity.  Denies any chest pain, shortness of breath.  No syncope or seizure-like activity.  He has otherwise been in his usual state of health.  Past Medical History:  Diagnosis Date   Arthritis    Diverticulosis of colon    GERD (gastroesophageal reflux disease)    Hypertension     Patient Active Problem List   Diagnosis Date Noted   Osteoarthritis 03/21/2018   Neuropathy, peripheral 05/31/2017   BPH with urinary obstruction 09/23/2015   Dysphagia 05/07/2011   Allergic rhinitis 07/09/2009   EXTERNAL THROMBOSED HEMORRHOIDS 06/11/2009   Bradycardia 08/23/2007   DIVERTICULOSIS, COLON 08/23/2007   Essential hypertension 09/14/2006   GERD 09/14/2006    Past Surgical History:  Procedure Laterality Date   cataract extaction, right  6/09   per Dr. [name]   CATARACT EXTRACTION Left    COLONOSCOPY  09/23/04   per Dr. [name], clear, no repeats needed    EGD and esophageal diatation,  05-10-11   per Dr. [name]     HERNIA REPAIR     rt inguinal hernia     x2       Family History  Problem Relation Age of Onset   Coronary artery disease Father    Colon cancer Neg Hx    Stomach cancer Neg Hx    Esophageal cancer Neg Hx     Social History   Tobacco Use   Smoking status: Former    Packs/day: 15.00    Years: 0.50    Pack years: 7.50    Types: Cigarettes   Smokeless tobacco: Never   Tobacco comments:    smoked 18 and stopped and states he stopped 30's   Vaping  Use   Vaping Use: Never used  Substance Use Topics   Alcohol use: No    Alcohol/week: 0.0 standard drinks   Drug use: No    Home Medications Prior to Admission medications   Medication Sig Start Date End Date Taking? Authorizing Provider  aspirin 81 MG tablet Take 81 mg by mouth daily.    [provider]  gabapentin (NEURONTIN) 300 MG capsule TAKE 1 CAPSULE BY MOUTH THREE TIMES A DAY 08/18/20   Laurey Morale, MD  hydrochlorothiazide (HYDRODIURIL) 25 MG tablet Take 1 tablet (25 mg total) by mouth every morning. 02/06/20   Laurey Morale, MD  meloxicam (MOBIC) 15 MG tablet TAKE 1/2 TAB BY [facility] DAILY 11/17/20   Laurey Morale, MD  Multiple Vitamin (MULTIVITAMIN WITH MINERALS) TABS tablet Take 1 tablet by mouth every morning.    [provider]  pantoprazole (PROTONIX) 40 MG tablet Take 1 tablet (40 mg total) by mouth daily. 02/06/20   Laurey Morale, MD  potassium chloride (KLOR-CON 10) 10 MEQ tablet Take 1 tablet (10 mEq total) by mouth daily. 02/28/20   Laurey Morale, MD  vitamin C (ASCORBIC ACID) 500 MG tablet Take 500 mg by mouth daily.  Reported on 10/27/2015    [provider]    Allergies    Contrast media [iodinated diagnostic agents]  Review of Systems   Review of Systems  Constitutional:  Negative for chills and fever.  HENT:  Negative for congestion.   Eyes:  Negative for visual disturbance.  Respiratory:  Negative for shortness of breath.   Cardiovascular:  Negative for chest pain.  Gastrointestinal:  Negative for abdominal pain, diarrhea and vomiting.  Genitourinary:  Negative for dysuria.  Musculoskeletal:        + Right foot pain and swelling  Skin:  Negative for rash.  Neurological:  Negative for headaches.   Physical Exam Updated Vital Signs BP 117/72 (BP Location: Left Arm)   Pulse 95   Temp 98.9 F (37.2 C) (Oral)   Resp 18   Ht '5\' 7"'$  (1.702 m)   Wt 71.2 kg   SpO2 96%   BMI 24.59 kg/m   Physical Exam Vitals and nursing note  reviewed.  Constitutional:      Appearance: Normal appearance.  HENT:     Head: Normocephalic.     Mouth/Throat:     Mouth: Mucous membranes are moist.  Cardiovascular:     Rate and Rhythm: Normal rate.  Pulmonary:     Effort: Pulmonary effort is normal. No respiratory distress.  Abdominal:     Palpations: Abdomen is soft.     Tenderness: There is no abdominal tenderness.  Musculoskeletal:     Comments: Right foot is edematous when compared to the left, equal palpable DP pulses, there is swelling from the foot going up to the mid calf that is pitting.  Of note he does have a blister on the left plantar aspect of the ball of his foot is open with a Band-Aid covering it.  Mild bleeding but no other concerning findings.  Skin:    General: Skin is warm.  Neurological:     Mental Status: He is alert and oriented to person, place, and time. Mental status is at baseline.  Psychiatric:        Mood and Affect: Mood normal.    ED Results / Procedures / Treatments   Labs (all labs ordered are listed, but only abnormal results are displayed) Labs Reviewed - No data to display  EKG None  Radiology DG Foot Complete Right  Result Date: 11/28/2020 CLINICAL DATA:  Right foot pain for 1 week after fall EXAM: RIGHT FOOT COMPLETE - 3+ VIEW COMPARISON:  None. FINDINGS: Generalized osteopenia to the degree that sensitivity of radiography is diminished. Chronic appearing deformities at the bases of the first, second and fourth proximal phalanges. Remote neck fractures of the second and third metatarsals. No acute fracture or subluxation. IMPRESSION: No acute finding. Electronically Signed   By: Monte Fantasia M.D.   On: 11/28/2020 11:29    Procedures Procedures   Medications Ordered in ED Medications - No data to display  ED Course  I have reviewed the triage vital signs and the nursing notes.  Pertinent labs & imaging results that were available during my care of the patient were reviewed by  me and considered in my medical decision making (see chart for details).    MDM Rules/Calculators/A&P                           87 year old male presents emergency department right foot pain and lower extremity swelling stemming from a mechanical fall about a week ago.  X-ray of the foot is negative.  He does have swelling from the calf to the foot with equal palpable pulses.  Neuro intact.  Ultrasound shows no DVT but does identify a right-sided Baker's cyst.  Discussed with the patient and his family member symptomatic treatment and possible orthopedic evaluation if needed.  Patient at this time appears safe and stable for discharge and will be treated as an outpatient.  Discharge plan and strict return to ED precautions discussed, patient verbalizes understanding and agreement.   Final Clinical Impression(s) / ED Diagnoses Final diagnoses:  None    Rx / DC Orders ED Discharge Orders     None        Lorelle Gibbs, DO 11/28/20 1514

## 2020-11-28 NOTE — ED Triage Notes (Signed)
Pt fell one week ago. Reports ongoing R foot pain and swelling.

## 2020-11-28 NOTE — Discharge Instructions (Addendum)
You have been seen and discharged from the emergency department.  Your foot x-ray was negative for fracture.  The ultrasound shows a Baker's cyst which commonly causes lower extremity pain/swelling.  Follow-up with your primary provider and orthopedics for reevaluation and further care.  Rest and elevate the right lower extremity, you may ice the knee.  Take home medications as prescribed. If you have any worsening symptoms or further concerns for your health please return to an emergency department for further evaluation.

## 2020-12-03 ENCOUNTER — Encounter: Payer: Self-pay | Admitting: Family Medicine

## 2020-12-03 ENCOUNTER — Other Ambulatory Visit: Payer: Self-pay

## 2020-12-03 ENCOUNTER — Ambulatory Visit (INDEPENDENT_AMBULATORY_CARE_PROVIDER_SITE_OTHER): Payer: Medicare Other | Admitting: Family Medicine

## 2020-12-03 VITALS — BP 118/62 | HR 84 | Temp 99.1°F | Wt 160.0 lb

## 2020-12-03 DIAGNOSIS — H0102A Squamous blepharitis right eye, upper and lower eyelids: Secondary | ICD-10-CM | POA: Diagnosis not present

## 2020-12-03 DIAGNOSIS — M7121 Synovial cyst of popliteal space [Baker], right knee: Secondary | ICD-10-CM

## 2020-12-03 DIAGNOSIS — D3131 Benign neoplasm of right choroid: Secondary | ICD-10-CM | POA: Diagnosis not present

## 2020-12-03 DIAGNOSIS — H0102B Squamous blepharitis left eye, upper and lower eyelids: Secondary | ICD-10-CM | POA: Diagnosis not present

## 2020-12-03 DIAGNOSIS — H04123 Dry eye syndrome of bilateral lacrimal glands: Secondary | ICD-10-CM | POA: Diagnosis not present

## 2020-12-03 DIAGNOSIS — Z961 Presence of intraocular lens: Secondary | ICD-10-CM | POA: Diagnosis not present

## 2020-12-03 DIAGNOSIS — H02834 Dermatochalasis of left upper eyelid: Secondary | ICD-10-CM | POA: Diagnosis not present

## 2020-12-03 DIAGNOSIS — H353131 Nonexudative age-related macular degeneration, bilateral, early dry stage: Secondary | ICD-10-CM | POA: Diagnosis not present

## 2020-12-03 DIAGNOSIS — H02831 Dermatochalasis of right upper eyelid: Secondary | ICD-10-CM | POA: Diagnosis not present

## 2020-12-03 DIAGNOSIS — H40013 Open angle with borderline findings, low risk, bilateral: Secondary | ICD-10-CM | POA: Diagnosis not present

## 2020-12-03 NOTE — Progress Notes (Signed)
   Subjective:    Patient ID: Gabriel [name], male    DOB: [birth year], 87 y.o.   MRN: [id number]  HPI Here with his daughter to follow up an ER visit on 11-28-20 after a fall. He had pain and swelling in the right foot. While there Xrays of the foot showed some old healed fractures but nothing acute. An [place] was negative for DVT but it did demonstrate a Baker's cyst. Since then the pain has improved but the swelling remains.    Review of Systems  Constitutional: Negative.   Respiratory: Negative.    Cardiovascular:  Positive for leg swelling. Negative for chest pain and palpitations.      Objective:   Physical Exam Constitutional:      General: He is not in acute distress.    Comments: Using his walker   Cardiovascular:     Rate and Rhythm: Normal rate and regular rhythm.     Pulses: Normal pulses.     Heart sounds: Normal heart sounds.  Pulmonary:     Effort: Pulmonary effort is normal.     Breath sounds: Normal breath sounds.  Musculoskeletal:     Comments: The right lower leg is red and swollen from the foot to the knee. No warmth or tenderness. No cords. Palpation of the knee reveals a small Baker's cyst in the popliteal space.   Neurological:     Mental Status: He is alert.          Assessment & Plan:  Baker's cyst. Because of the swelling, we agreed he should see Orthopedics to evaluate this further. We will put in he referral. In the meantime he will kep the leg elevated. We spent 35 minutes reviewing records and discussing these issues.  Alysia Penna, MD

## 2020-12-09 DIAGNOSIS — M25561 Pain in right knee: Secondary | ICD-10-CM | POA: Diagnosis not present

## 2020-12-23 DIAGNOSIS — M6281 Muscle weakness (generalized): Secondary | ICD-10-CM | POA: Diagnosis not present

## 2020-12-23 DIAGNOSIS — Z9181 History of falling: Secondary | ICD-10-CM | POA: Diagnosis not present

## 2020-12-23 DIAGNOSIS — R2681 Unsteadiness on feet: Secondary | ICD-10-CM | POA: Diagnosis not present

## 2020-12-23 DIAGNOSIS — R29898 Other symptoms and signs involving the musculoskeletal system: Secondary | ICD-10-CM | POA: Diagnosis not present

## 2020-12-25 DIAGNOSIS — Z9181 History of falling: Secondary | ICD-10-CM | POA: Diagnosis not present

## 2020-12-25 DIAGNOSIS — R29898 Other symptoms and signs involving the musculoskeletal system: Secondary | ICD-10-CM | POA: Diagnosis not present

## 2020-12-25 DIAGNOSIS — M6281 Muscle weakness (generalized): Secondary | ICD-10-CM | POA: Diagnosis not present

## 2020-12-25 DIAGNOSIS — R2681 Unsteadiness on feet: Secondary | ICD-10-CM | POA: Diagnosis not present

## 2020-12-26 DIAGNOSIS — R2681 Unsteadiness on feet: Secondary | ICD-10-CM | POA: Diagnosis not present

## 2020-12-26 DIAGNOSIS — R29898 Other symptoms and signs involving the musculoskeletal system: Secondary | ICD-10-CM | POA: Diagnosis not present

## 2020-12-26 DIAGNOSIS — M6281 Muscle weakness (generalized): Secondary | ICD-10-CM | POA: Diagnosis not present

## 2020-12-26 DIAGNOSIS — Z9181 History of falling: Secondary | ICD-10-CM | POA: Diagnosis not present

## 2020-12-30 DIAGNOSIS — M6281 Muscle weakness (generalized): Secondary | ICD-10-CM | POA: Diagnosis not present

## 2020-12-30 DIAGNOSIS — R2681 Unsteadiness on feet: Secondary | ICD-10-CM | POA: Diagnosis not present

## 2020-12-30 DIAGNOSIS — Z9181 History of falling: Secondary | ICD-10-CM | POA: Diagnosis not present

## 2020-12-30 DIAGNOSIS — R29898 Other symptoms and signs involving the musculoskeletal system: Secondary | ICD-10-CM | POA: Diagnosis not present

## 2020-12-31 DIAGNOSIS — Z9181 History of falling: Secondary | ICD-10-CM | POA: Diagnosis not present

## 2020-12-31 DIAGNOSIS — R29898 Other symptoms and signs involving the musculoskeletal system: Secondary | ICD-10-CM | POA: Diagnosis not present

## 2020-12-31 DIAGNOSIS — M6281 Muscle weakness (generalized): Secondary | ICD-10-CM | POA: Diagnosis not present

## 2020-12-31 DIAGNOSIS — R2681 Unsteadiness on feet: Secondary | ICD-10-CM | POA: Diagnosis not present

## 2021-01-02 DIAGNOSIS — Z9181 History of falling: Secondary | ICD-10-CM | POA: Diagnosis not present

## 2021-01-02 DIAGNOSIS — R2681 Unsteadiness on feet: Secondary | ICD-10-CM | POA: Diagnosis not present

## 2021-01-02 DIAGNOSIS — M6281 Muscle weakness (generalized): Secondary | ICD-10-CM | POA: Diagnosis not present

## 2021-01-02 DIAGNOSIS — R29898 Other symptoms and signs involving the musculoskeletal system: Secondary | ICD-10-CM | POA: Diagnosis not present

## 2021-01-06 DIAGNOSIS — M6281 Muscle weakness (generalized): Secondary | ICD-10-CM | POA: Diagnosis not present

## 2021-01-06 DIAGNOSIS — R2681 Unsteadiness on feet: Secondary | ICD-10-CM | POA: Diagnosis not present

## 2021-01-06 DIAGNOSIS — Z9181 History of falling: Secondary | ICD-10-CM | POA: Diagnosis not present

## 2021-01-06 DIAGNOSIS — R29898 Other symptoms and signs involving the musculoskeletal system: Secondary | ICD-10-CM | POA: Diagnosis not present

## 2021-01-07 DIAGNOSIS — Z9181 History of falling: Secondary | ICD-10-CM | POA: Diagnosis not present

## 2021-01-07 DIAGNOSIS — R29898 Other symptoms and signs involving the musculoskeletal system: Secondary | ICD-10-CM | POA: Diagnosis not present

## 2021-01-07 DIAGNOSIS — M6281 Muscle weakness (generalized): Secondary | ICD-10-CM | POA: Diagnosis not present

## 2021-01-07 DIAGNOSIS — R2681 Unsteadiness on feet: Secondary | ICD-10-CM | POA: Diagnosis not present

## 2021-01-12 DIAGNOSIS — Z9181 History of falling: Secondary | ICD-10-CM | POA: Diagnosis not present

## 2021-01-12 DIAGNOSIS — R2681 Unsteadiness on feet: Secondary | ICD-10-CM | POA: Diagnosis not present

## 2021-01-12 DIAGNOSIS — R29898 Other symptoms and signs involving the musculoskeletal system: Secondary | ICD-10-CM | POA: Diagnosis not present

## 2021-01-12 DIAGNOSIS — M6281 Muscle weakness (generalized): Secondary | ICD-10-CM | POA: Diagnosis not present

## 2021-01-14 DIAGNOSIS — R2681 Unsteadiness on feet: Secondary | ICD-10-CM | POA: Diagnosis not present

## 2021-01-14 DIAGNOSIS — M6281 Muscle weakness (generalized): Secondary | ICD-10-CM | POA: Diagnosis not present

## 2021-01-14 DIAGNOSIS — R29898 Other symptoms and signs involving the musculoskeletal system: Secondary | ICD-10-CM | POA: Diagnosis not present

## 2021-01-14 DIAGNOSIS — Z9181 History of falling: Secondary | ICD-10-CM | POA: Diagnosis not present

## 2021-01-16 DIAGNOSIS — Z9181 History of falling: Secondary | ICD-10-CM | POA: Diagnosis not present

## 2021-01-16 DIAGNOSIS — M6281 Muscle weakness (generalized): Secondary | ICD-10-CM | POA: Diagnosis not present

## 2021-01-16 DIAGNOSIS — R2681 Unsteadiness on feet: Secondary | ICD-10-CM | POA: Diagnosis not present

## 2021-01-16 DIAGNOSIS — R29898 Other symptoms and signs involving the musculoskeletal system: Secondary | ICD-10-CM | POA: Diagnosis not present

## 2021-01-20 DIAGNOSIS — M6281 Muscle weakness (generalized): Secondary | ICD-10-CM | POA: Diagnosis not present

## 2021-01-20 DIAGNOSIS — R29898 Other symptoms and signs involving the musculoskeletal system: Secondary | ICD-10-CM | POA: Diagnosis not present

## 2021-01-20 DIAGNOSIS — Z9181 History of falling: Secondary | ICD-10-CM | POA: Diagnosis not present

## 2021-01-20 DIAGNOSIS — R2681 Unsteadiness on feet: Secondary | ICD-10-CM | POA: Diagnosis not present

## 2021-01-21 DIAGNOSIS — R29898 Other symptoms and signs involving the musculoskeletal system: Secondary | ICD-10-CM | POA: Diagnosis not present

## 2021-01-21 DIAGNOSIS — M6281 Muscle weakness (generalized): Secondary | ICD-10-CM | POA: Diagnosis not present

## 2021-01-21 DIAGNOSIS — R2681 Unsteadiness on feet: Secondary | ICD-10-CM | POA: Diagnosis not present

## 2021-01-21 DIAGNOSIS — Z9181 History of falling: Secondary | ICD-10-CM | POA: Diagnosis not present

## 2021-01-23 DIAGNOSIS — R2681 Unsteadiness on feet: Secondary | ICD-10-CM | POA: Diagnosis not present

## 2021-01-23 DIAGNOSIS — R29898 Other symptoms and signs involving the musculoskeletal system: Secondary | ICD-10-CM | POA: Diagnosis not present

## 2021-01-23 DIAGNOSIS — Z9181 History of falling: Secondary | ICD-10-CM | POA: Diagnosis not present

## 2021-01-23 DIAGNOSIS — M6281 Muscle weakness (generalized): Secondary | ICD-10-CM | POA: Diagnosis not present

## 2021-01-27 DIAGNOSIS — M6281 Muscle weakness (generalized): Secondary | ICD-10-CM | POA: Diagnosis not present

## 2021-01-27 DIAGNOSIS — R29898 Other symptoms and signs involving the musculoskeletal system: Secondary | ICD-10-CM | POA: Diagnosis not present

## 2021-01-27 DIAGNOSIS — R2681 Unsteadiness on feet: Secondary | ICD-10-CM | POA: Diagnosis not present

## 2021-01-27 DIAGNOSIS — Z9181 History of falling: Secondary | ICD-10-CM | POA: Diagnosis not present

## 2021-01-28 DIAGNOSIS — M6281 Muscle weakness (generalized): Secondary | ICD-10-CM | POA: Diagnosis not present

## 2021-01-28 DIAGNOSIS — Z9181 History of falling: Secondary | ICD-10-CM | POA: Diagnosis not present

## 2021-01-28 DIAGNOSIS — R2681 Unsteadiness on feet: Secondary | ICD-10-CM | POA: Diagnosis not present

## 2021-01-28 DIAGNOSIS — R29898 Other symptoms and signs involving the musculoskeletal system: Secondary | ICD-10-CM | POA: Diagnosis not present

## 2021-01-30 DIAGNOSIS — Z9181 History of falling: Secondary | ICD-10-CM | POA: Diagnosis not present

## 2021-01-30 DIAGNOSIS — R29898 Other symptoms and signs involving the musculoskeletal system: Secondary | ICD-10-CM | POA: Diagnosis not present

## 2021-01-30 DIAGNOSIS — M6281 Muscle weakness (generalized): Secondary | ICD-10-CM | POA: Diagnosis not present

## 2021-01-30 DIAGNOSIS — R2681 Unsteadiness on feet: Secondary | ICD-10-CM | POA: Diagnosis not present

## 2021-02-03 DIAGNOSIS — R2681 Unsteadiness on feet: Secondary | ICD-10-CM | POA: Diagnosis not present

## 2021-02-03 DIAGNOSIS — M6281 Muscle weakness (generalized): Secondary | ICD-10-CM | POA: Diagnosis not present

## 2021-02-03 DIAGNOSIS — R29898 Other symptoms and signs involving the musculoskeletal system: Secondary | ICD-10-CM | POA: Diagnosis not present

## 2021-02-03 DIAGNOSIS — Z9181 History of falling: Secondary | ICD-10-CM | POA: Diagnosis not present

## 2021-02-04 DIAGNOSIS — R2681 Unsteadiness on feet: Secondary | ICD-10-CM | POA: Diagnosis not present

## 2021-02-04 DIAGNOSIS — Z9181 History of falling: Secondary | ICD-10-CM | POA: Diagnosis not present

## 2021-02-04 DIAGNOSIS — M6281 Muscle weakness (generalized): Secondary | ICD-10-CM | POA: Diagnosis not present

## 2021-02-04 DIAGNOSIS — R29898 Other symptoms and signs involving the musculoskeletal system: Secondary | ICD-10-CM | POA: Diagnosis not present

## 2021-02-06 DIAGNOSIS — Z9181 History of falling: Secondary | ICD-10-CM | POA: Diagnosis not present

## 2021-02-06 DIAGNOSIS — R29898 Other symptoms and signs involving the musculoskeletal system: Secondary | ICD-10-CM | POA: Diagnosis not present

## 2021-02-06 DIAGNOSIS — R2681 Unsteadiness on feet: Secondary | ICD-10-CM | POA: Diagnosis not present

## 2021-02-06 DIAGNOSIS — M6281 Muscle weakness (generalized): Secondary | ICD-10-CM | POA: Diagnosis not present

## 2021-02-10 DIAGNOSIS — M6281 Muscle weakness (generalized): Secondary | ICD-10-CM | POA: Diagnosis not present

## 2021-02-10 DIAGNOSIS — R2681 Unsteadiness on feet: Secondary | ICD-10-CM | POA: Diagnosis not present

## 2021-02-10 DIAGNOSIS — Z9181 History of falling: Secondary | ICD-10-CM | POA: Diagnosis not present

## 2021-02-10 DIAGNOSIS — R29898 Other symptoms and signs involving the musculoskeletal system: Secondary | ICD-10-CM | POA: Diagnosis not present

## 2021-02-11 ENCOUNTER — Other Ambulatory Visit: Payer: Self-pay | Admitting: Family Medicine

## 2021-02-11 DIAGNOSIS — R2681 Unsteadiness on feet: Secondary | ICD-10-CM | POA: Diagnosis not present

## 2021-02-11 DIAGNOSIS — I10 Essential (primary) hypertension: Secondary | ICD-10-CM

## 2021-02-11 DIAGNOSIS — R29898 Other symptoms and signs involving the musculoskeletal system: Secondary | ICD-10-CM | POA: Diagnosis not present

## 2021-02-11 DIAGNOSIS — K219 Gastro-esophageal reflux disease without esophagitis: Secondary | ICD-10-CM

## 2021-02-11 DIAGNOSIS — M6281 Muscle weakness (generalized): Secondary | ICD-10-CM | POA: Diagnosis not present

## 2021-02-11 DIAGNOSIS — Z9181 History of falling: Secondary | ICD-10-CM | POA: Diagnosis not present

## 2021-02-13 DIAGNOSIS — M6281 Muscle weakness (generalized): Secondary | ICD-10-CM | POA: Diagnosis not present

## 2021-02-13 DIAGNOSIS — Z9181 History of falling: Secondary | ICD-10-CM | POA: Diagnosis not present

## 2021-02-13 DIAGNOSIS — R29898 Other symptoms and signs involving the musculoskeletal system: Secondary | ICD-10-CM | POA: Diagnosis not present

## 2021-02-13 DIAGNOSIS — R2681 Unsteadiness on feet: Secondary | ICD-10-CM | POA: Diagnosis not present

## 2021-02-17 DIAGNOSIS — R29898 Other symptoms and signs involving the musculoskeletal system: Secondary | ICD-10-CM | POA: Diagnosis not present

## 2021-02-17 DIAGNOSIS — Z9181 History of falling: Secondary | ICD-10-CM | POA: Diagnosis not present

## 2021-02-17 DIAGNOSIS — M6281 Muscle weakness (generalized): Secondary | ICD-10-CM | POA: Diagnosis not present

## 2021-02-17 DIAGNOSIS — R2681 Unsteadiness on feet: Secondary | ICD-10-CM | POA: Diagnosis not present

## 2021-02-18 DIAGNOSIS — Z9181 History of falling: Secondary | ICD-10-CM | POA: Diagnosis not present

## 2021-02-18 DIAGNOSIS — R2681 Unsteadiness on feet: Secondary | ICD-10-CM | POA: Diagnosis not present

## 2021-02-18 DIAGNOSIS — M6281 Muscle weakness (generalized): Secondary | ICD-10-CM | POA: Diagnosis not present

## 2021-02-18 DIAGNOSIS — R29898 Other symptoms and signs involving the musculoskeletal system: Secondary | ICD-10-CM | POA: Diagnosis not present

## 2021-02-20 DIAGNOSIS — M6281 Muscle weakness (generalized): Secondary | ICD-10-CM | POA: Diagnosis not present

## 2021-02-20 DIAGNOSIS — R29898 Other symptoms and signs involving the musculoskeletal system: Secondary | ICD-10-CM | POA: Diagnosis not present

## 2021-02-20 DIAGNOSIS — Z9181 History of falling: Secondary | ICD-10-CM | POA: Diagnosis not present

## 2021-02-20 DIAGNOSIS — R2681 Unsteadiness on feet: Secondary | ICD-10-CM | POA: Diagnosis not present

## 2021-02-24 DIAGNOSIS — M6281 Muscle weakness (generalized): Secondary | ICD-10-CM | POA: Diagnosis not present

## 2021-02-24 DIAGNOSIS — R29898 Other symptoms and signs involving the musculoskeletal system: Secondary | ICD-10-CM | POA: Diagnosis not present

## 2021-02-24 DIAGNOSIS — R2681 Unsteadiness on feet: Secondary | ICD-10-CM | POA: Diagnosis not present

## 2021-02-24 DIAGNOSIS — Z9181 History of falling: Secondary | ICD-10-CM | POA: Diagnosis not present

## 2021-02-25 DIAGNOSIS — R2681 Unsteadiness on feet: Secondary | ICD-10-CM | POA: Diagnosis not present

## 2021-02-25 DIAGNOSIS — Z9181 History of falling: Secondary | ICD-10-CM | POA: Diagnosis not present

## 2021-02-25 DIAGNOSIS — M6281 Muscle weakness (generalized): Secondary | ICD-10-CM | POA: Diagnosis not present

## 2021-02-25 DIAGNOSIS — R29898 Other symptoms and signs involving the musculoskeletal system: Secondary | ICD-10-CM | POA: Diagnosis not present

## 2021-02-27 DIAGNOSIS — R2681 Unsteadiness on feet: Secondary | ICD-10-CM | POA: Diagnosis not present

## 2021-02-27 DIAGNOSIS — Z9181 History of falling: Secondary | ICD-10-CM | POA: Diagnosis not present

## 2021-02-27 DIAGNOSIS — M6281 Muscle weakness (generalized): Secondary | ICD-10-CM | POA: Diagnosis not present

## 2021-02-27 DIAGNOSIS — R29898 Other symptoms and signs involving the musculoskeletal system: Secondary | ICD-10-CM | POA: Diagnosis not present

## 2021-03-03 DIAGNOSIS — Z9181 History of falling: Secondary | ICD-10-CM | POA: Diagnosis not present

## 2021-03-03 DIAGNOSIS — M6281 Muscle weakness (generalized): Secondary | ICD-10-CM | POA: Diagnosis not present

## 2021-03-03 DIAGNOSIS — R29898 Other symptoms and signs involving the musculoskeletal system: Secondary | ICD-10-CM | POA: Diagnosis not present

## 2021-03-03 DIAGNOSIS — R2681 Unsteadiness on feet: Secondary | ICD-10-CM | POA: Diagnosis not present

## 2021-03-04 DIAGNOSIS — R29898 Other symptoms and signs involving the musculoskeletal system: Secondary | ICD-10-CM | POA: Diagnosis not present

## 2021-03-04 DIAGNOSIS — M6281 Muscle weakness (generalized): Secondary | ICD-10-CM | POA: Diagnosis not present

## 2021-03-04 DIAGNOSIS — Z9181 History of falling: Secondary | ICD-10-CM | POA: Diagnosis not present

## 2021-03-04 DIAGNOSIS — R2681 Unsteadiness on feet: Secondary | ICD-10-CM | POA: Diagnosis not present

## 2021-03-06 DIAGNOSIS — R2681 Unsteadiness on feet: Secondary | ICD-10-CM | POA: Diagnosis not present

## 2021-03-06 DIAGNOSIS — Z9181 History of falling: Secondary | ICD-10-CM | POA: Diagnosis not present

## 2021-03-06 DIAGNOSIS — M6281 Muscle weakness (generalized): Secondary | ICD-10-CM | POA: Diagnosis not present

## 2021-03-06 DIAGNOSIS — R29898 Other symptoms and signs involving the musculoskeletal system: Secondary | ICD-10-CM | POA: Diagnosis not present

## 2021-03-10 DIAGNOSIS — R29898 Other symptoms and signs involving the musculoskeletal system: Secondary | ICD-10-CM | POA: Diagnosis not present

## 2021-03-10 DIAGNOSIS — M6281 Muscle weakness (generalized): Secondary | ICD-10-CM | POA: Diagnosis not present

## 2021-03-10 DIAGNOSIS — Z9181 History of falling: Secondary | ICD-10-CM | POA: Diagnosis not present

## 2021-03-10 DIAGNOSIS — R2681 Unsteadiness on feet: Secondary | ICD-10-CM | POA: Diagnosis not present

## 2021-03-11 DIAGNOSIS — M6281 Muscle weakness (generalized): Secondary | ICD-10-CM | POA: Diagnosis not present

## 2021-03-11 DIAGNOSIS — R2681 Unsteadiness on feet: Secondary | ICD-10-CM | POA: Diagnosis not present

## 2021-03-11 DIAGNOSIS — Z9181 History of falling: Secondary | ICD-10-CM | POA: Diagnosis not present

## 2021-03-11 DIAGNOSIS — R29898 Other symptoms and signs involving the musculoskeletal system: Secondary | ICD-10-CM | POA: Diagnosis not present

## 2021-03-13 DIAGNOSIS — R2681 Unsteadiness on feet: Secondary | ICD-10-CM | POA: Diagnosis not present

## 2021-03-13 DIAGNOSIS — M6281 Muscle weakness (generalized): Secondary | ICD-10-CM | POA: Diagnosis not present

## 2021-03-13 DIAGNOSIS — R29898 Other symptoms and signs involving the musculoskeletal system: Secondary | ICD-10-CM | POA: Diagnosis not present

## 2021-03-13 DIAGNOSIS — Z9181 History of falling: Secondary | ICD-10-CM | POA: Diagnosis not present

## 2021-03-17 DIAGNOSIS — M6281 Muscle weakness (generalized): Secondary | ICD-10-CM | POA: Diagnosis not present

## 2021-03-17 DIAGNOSIS — R2681 Unsteadiness on feet: Secondary | ICD-10-CM | POA: Diagnosis not present

## 2021-03-17 DIAGNOSIS — R29898 Other symptoms and signs involving the musculoskeletal system: Secondary | ICD-10-CM | POA: Diagnosis not present

## 2021-03-17 DIAGNOSIS — Z9181 History of falling: Secondary | ICD-10-CM | POA: Diagnosis not present

## 2021-03-18 DIAGNOSIS — Z9181 History of falling: Secondary | ICD-10-CM | POA: Diagnosis not present

## 2021-03-18 DIAGNOSIS — M6281 Muscle weakness (generalized): Secondary | ICD-10-CM | POA: Diagnosis not present

## 2021-03-18 DIAGNOSIS — R2681 Unsteadiness on feet: Secondary | ICD-10-CM | POA: Diagnosis not present

## 2021-03-18 DIAGNOSIS — R29898 Other symptoms and signs involving the musculoskeletal system: Secondary | ICD-10-CM | POA: Diagnosis not present

## 2021-03-20 DIAGNOSIS — R29898 Other symptoms and signs involving the musculoskeletal system: Secondary | ICD-10-CM | POA: Diagnosis not present

## 2021-03-20 DIAGNOSIS — Z9181 History of falling: Secondary | ICD-10-CM | POA: Diagnosis not present

## 2021-03-20 DIAGNOSIS — R2681 Unsteadiness on feet: Secondary | ICD-10-CM | POA: Diagnosis not present

## 2021-03-20 DIAGNOSIS — M6281 Muscle weakness (generalized): Secondary | ICD-10-CM | POA: Diagnosis not present

## 2021-06-04 ENCOUNTER — Ambulatory Visit (INDEPENDENT_AMBULATORY_CARE_PROVIDER_SITE_OTHER): Payer: Medicare Other | Admitting: Adult Health

## 2021-06-04 ENCOUNTER — Encounter: Payer: Self-pay | Admitting: Adult Health

## 2021-06-04 VITALS — BP 114/80 | HR 71 | Temp 98.0°F | Ht 67.0 in | Wt 155.0 lb

## 2021-06-04 DIAGNOSIS — I10 Essential (primary) hypertension: Secondary | ICD-10-CM

## 2021-06-04 NOTE — Progress Notes (Signed)
Subjective:    Patient ID: Gabriel [name], male    DOB: 03/12/1925, 88 y.o.   MRN: [id number]  HPI 88 year old male who  has a past medical history of Arthritis, Diverticulosis of colon, GERD (gastroesophageal reflux disease), and Hypertension.  He is a patient of Dr. Sarajane Jews who I am seeing today for concern for elevated blood pressure.  He is currently prescribed hydrochlorothiazide 25 mg daily.  He reports that he woke up around 2 AM this morning and felt "a little strange" had the nurse check his blood pressure and it was "a little bit high".  Does not know the reading.  He then had his blood pressure rechecked twice later on today and it was " normal".  Did not experience headaches, blurred vision, shortness of breath, or chest pain.  BP Readings from Last 3 Encounters:  06/04/21 114/80  12/03/20 118/62  11/28/20 (!) 170/71   Review of Systems See HPI   Past Medical History:  Diagnosis Date   Arthritis    Diverticulosis of colon    GERD (gastroesophageal reflux disease)    Hypertension     Social History   Socioeconomic History   Marital status: Widowed    Spouse name: Not on file   Number of children: 3   Years of education: Not on file   Highest education level: Not on file  Occupational History   Occupation: retired  Tobacco Use   Smoking status: Former    Packs/day: 15.00    Years: 0.50    Pack years: 7.50    Types: Cigarettes   Smokeless tobacco: Never   Tobacco comments:    smoked 18 and stopped and states he stopped 30's   Vaping Use   Vaping Use: Never used  Substance and Sexual Activity   Alcohol use: No    Alcohol/week: 0.0 standard drinks   Drug use: No   Sexual activity: Never    Birth control/protection: Abstinence  Other Topics Concern   Not on file  Social History Narrative   Lives in studio at [place]   3 children   Social Determinants of Health   Financial Resource Strain: Low Risk    Difficulty of Paying Living  Expenses: Not hard at all  Food Insecurity: No Food Insecurity   Worried About Charity fundraiser in the Last Year: Never true   [place] in the Last Year: Never true  Transportation Needs: No Transportation Needs   Lack of Transportation (Medical): No   Lack of Transportation (Non-Medical): No  Physical Activity: Insufficiently Active   Days of Exercise per Week: 7 days   Minutes of Exercise per Session: 20 min  Stress: No Stress Concern Present   Feeling of Stress : Not at all  Social Connections: Moderately Isolated   Frequency of Communication with Friends and Family: More than three times a week   Frequency of Social Gatherings with Friends and Family: More than three times a week   Attends Religious Services: 1 to 4 times per year   Active Member of [employer] or Organizations: No   Attends Archivist Meetings: Never   Marital Status: Widowed  Intimate Partner Violence: Not At Risk   Fear of Current or Ex-Partner: No   Emotionally Abused: No   Physically Abused: No   Sexually Abused: No    Past Surgical History:  Procedure Laterality Date   cataract extaction, right  6/09  per Dr. [name]   CATARACT EXTRACTION Left    COLONOSCOPY  09/23/04   per Dr. [name], clear, no repeats needed    EGD and esophageal diatation,  05-10-11   per Dr. [name]     HERNIA REPAIR     rt inguinal hernia     x2    Family History  Problem Relation Age of Onset   Coronary artery disease Father    Colon cancer Neg Hx    Stomach cancer Neg Hx    Esophageal cancer Neg Hx     Allergies  Allergen Reactions   Contrast Media [Iodinated Contrast Media] Rash    Current Outpatient Medications on File Prior to Visit  Medication Sig Dispense Refill   aspirin 81 MG tablet Take 81 mg by mouth daily.     gabapentin (NEURONTIN) 300 MG capsule TAKE 1 CAPSULE BY MOUTH THREE TIMES A DAY 270 capsule 1   hydrochlorothiazide (HYDRODIURIL) 25 MG tablet TAKE 1 TABLET BY MOUTH EVERY DAY IN  THE MORNING 90 tablet 3   meloxicam (MOBIC) 15 MG tablet TAKE 1/2 TAB BY MUTH DAILY 45 tablet 7   Multiple Vitamin (MULTIVITAMIN WITH MINERALS) TABS tablet Take 1 tablet by mouth every morning.     pantoprazole (PROTONIX) 40 MG tablet TAKE 1 TABLET BY MOUTH EVERY DAY 90 tablet 3   potassium chloride (KLOR-CON) 10 MEQ tablet TAKE 1 TABLET BY MOUTH EVERY DAY 90 tablet 3   vitamin C (ASCORBIC ACID) 500 MG tablet Take 500 mg by mouth daily. Reported on 10/27/2015     No current facility-administered medications on file prior to visit.    BP 114/80    Pulse 71    Temp 98 F (36.7 C) (Oral)    Ht 5\' 7"  (1.702 m)    Wt 155 lb (70.3 kg)    SpO2 99%    BMI 24.28 kg/m       Objective:   Physical Exam Vitals and nursing note reviewed.  Constitutional:      Appearance: Normal appearance.  Cardiovascular:     Rate and Rhythm: Normal rate and regular rhythm.     Pulses: Normal pulses.     Heart sounds: Normal heart sounds.  Pulmonary:     Effort: Pulmonary effort is normal.     Breath sounds: Normal breath sounds.  Skin:    General: Skin is warm and dry.     Capillary Refill: Capillary refill takes less than 2 seconds.  Neurological:     General: No focal deficit present.     Mental Status: He is alert and oriented to person, place, and time.  Psychiatric:        Mood and Affect: Mood normal.        Behavior: Behavior normal.        Thought Content: Thought content normal.        Judgment: Judgment normal.       Assessment & Plan:  1. Essential hypertension - BP at goal during office visit today  - Advised to continue to monitor  - Follow up as needed  Time spent on chart review, time with patient; discussion of hypertension, home monitoring with cuff, treatment, follow up plan, and documentation 30 minutes

## 2021-06-04 NOTE — Patient Instructions (Signed)
It was great seeing you today   Your blood pressure was normal. I would keep an eye on it.   Please follow up with Dr. Sarajane Jews as needed

## 2021-06-06 ENCOUNTER — Inpatient Hospital Stay (HOSPITAL_COMMUNITY)
Admission: EM | Admit: 2021-06-06 | Discharge: 2021-06-10 | DRG: 247 | Disposition: A | Discharge status: Home / Self Care | Payer: Medicare Other | Attending: Internal Medicine | Admitting: Internal Medicine

## 2021-06-06 ENCOUNTER — Emergency Department (HOSPITAL_COMMUNITY): Payer: Medicare Other

## 2021-06-06 ENCOUNTER — Encounter (HOSPITAL_COMMUNITY): Payer: Self-pay | Admitting: Emergency Medicine

## 2021-06-06 ENCOUNTER — Encounter (HOSPITAL_COMMUNITY)
Admission: EM | Disposition: A | Discharge status: Home / Self Care | Payer: Self-pay | Source: Home / Self Care | Attending: Internal Medicine

## 2021-06-06 DIAGNOSIS — Z79899 Other long term (current) drug therapy: Secondary | ICD-10-CM | POA: Diagnosis not present

## 2021-06-06 DIAGNOSIS — I2102 ST elevation (STEMI) myocardial infarction involving left anterior descending coronary artery: Secondary | ICD-10-CM | POA: Diagnosis not present

## 2021-06-06 DIAGNOSIS — I480 Paroxysmal atrial fibrillation: Secondary | ICD-10-CM | POA: Diagnosis present

## 2021-06-06 DIAGNOSIS — Z7982 Long term (current) use of aspirin: Secondary | ICD-10-CM

## 2021-06-06 DIAGNOSIS — I2510 Atherosclerotic heart disease of native coronary artery without angina pectoris: Secondary | ICD-10-CM

## 2021-06-06 DIAGNOSIS — E785 Hyperlipidemia, unspecified: Secondary | ICD-10-CM | POA: Diagnosis not present

## 2021-06-06 DIAGNOSIS — I213 ST elevation (STEMI) myocardial infarction of unspecified site: Secondary | ICD-10-CM | POA: Diagnosis not present

## 2021-06-06 DIAGNOSIS — Z20822 Contact with and (suspected) exposure to covid-19: Secondary | ICD-10-CM | POA: Diagnosis not present

## 2021-06-06 DIAGNOSIS — R0902 Hypoxemia: Secondary | ICD-10-CM | POA: Diagnosis not present

## 2021-06-06 DIAGNOSIS — Z91041 Radiographic dye allergy status: Secondary | ICD-10-CM

## 2021-06-06 DIAGNOSIS — Z955 Presence of coronary angioplasty implant and graft: Secondary | ICD-10-CM

## 2021-06-06 DIAGNOSIS — I493 Ventricular premature depolarization: Secondary | ICD-10-CM | POA: Diagnosis present

## 2021-06-06 DIAGNOSIS — K219 Gastro-esophageal reflux disease without esophagitis: Secondary | ICD-10-CM | POA: Diagnosis not present

## 2021-06-06 DIAGNOSIS — Z9841 Cataract extraction status, right eye: Secondary | ICD-10-CM

## 2021-06-06 DIAGNOSIS — R9431 Abnormal electrocardiogram [ECG] [EKG]: Secondary | ICD-10-CM | POA: Diagnosis not present

## 2021-06-06 DIAGNOSIS — R079 Chest pain, unspecified: Secondary | ICD-10-CM | POA: Diagnosis not present

## 2021-06-06 DIAGNOSIS — I10 Essential (primary) hypertension: Secondary | ICD-10-CM | POA: Diagnosis present

## 2021-06-06 DIAGNOSIS — J449 Chronic obstructive pulmonary disease, unspecified: Secondary | ICD-10-CM | POA: Diagnosis not present

## 2021-06-06 DIAGNOSIS — J439 Emphysema, unspecified: Secondary | ICD-10-CM | POA: Diagnosis not present

## 2021-06-06 DIAGNOSIS — R0789 Other chest pain: Secondary | ICD-10-CM | POA: Diagnosis not present

## 2021-06-06 HISTORY — PX: LEFT HEART CATH AND CORONARY ANGIOGRAPHY: CATH118249

## 2021-06-06 HISTORY — PX: CORONARY STENT INTERVENTION: CATH118234

## 2021-06-06 HISTORY — PX: CORONARY/GRAFT ACUTE MI REVASCULARIZATION: CATH118305

## 2021-06-06 LAB — CBC WITH DIFFERENTIAL/PLATELET
Abs Immature Granulocytes: 0.03 10*3/uL (ref 0.00–0.07)
Basophils Absolute: 0.1 10*3/uL (ref 0.0–0.1)
Basophils Relative: 1 %
Eosinophils Absolute: 0.3 10*3/uL (ref 0.0–0.5)
Eosinophils Relative: 3 %
HCT: 44.8 % (ref 39.0–52.0)
Hemoglobin: 16 g/dL (ref 13.0–17.0)
Immature Granulocytes: 0 %
Lymphocytes Relative: 21 %
Lymphs Abs: 2 10*3/uL (ref 0.7–4.0)
MCH: 31.6 pg (ref 26.0–34.0)
MCHC: 35.7 g/dL (ref 30.0–36.0)
MCV: 88.4 fL (ref 80.0–100.0)
Monocytes Absolute: 0.9 10*3/uL (ref 0.1–1.0)
Monocytes Relative: 9 %
Neutro Abs: 6.3 10*3/uL (ref 1.7–7.7)
Neutrophils Relative %: 66 %
Platelets: 303 10*3/uL (ref 150–400)
RBC: 5.07 MIL/uL (ref 4.22–5.81)
RDW: 13 % (ref 11.5–15.5)
WBC: 9.5 10*3/uL (ref 4.0–10.5)
nRBC: 0 % (ref 0.0–0.2)

## 2021-06-06 LAB — TROPONIN I (HIGH SENSITIVITY): Troponin I (High Sensitivity): 1712 ng/L (ref <18)

## 2021-06-06 LAB — LIPID PANEL
Cholesterol: 151 mg/dL (ref 0–200)
HDL: 44 mg/dL (ref >40)
LDL Cholesterol: 77 mg/dL (ref 0–99)
Total CHOL/HDL Ratio: 3.4 RATIO
Triglycerides: 151 mg/dL — ABNORMAL HIGH (ref <150)
VLDL: 30 mg/dL (ref 0–40)

## 2021-06-06 LAB — I-STAT CHEM 8, ED
BUN: 15 mg/dL (ref 8–23)
Calcium, Ion: 1.17 mmol/L (ref 1.15–1.40)
Chloride: 90 mmol/L — ABNORMAL LOW (ref 98–111)
Creatinine, Ser: 0.6 mg/dL — ABNORMAL LOW (ref 0.61–1.24)
Glucose, Bld: 125 mg/dL — ABNORMAL HIGH (ref 70–99)
HCT: 48 % (ref 39.0–52.0)
Hemoglobin: 16.3 g/dL (ref 13.0–17.0)
Potassium: 3.3 mmol/L — ABNORMAL LOW (ref 3.5–5.1)
Sodium: 130 mmol/L — ABNORMAL LOW (ref 135–145)
TCO2: 27 mmol/L (ref 22–32)

## 2021-06-06 LAB — COMPREHENSIVE METABOLIC PANEL
ALT: 30 U/L (ref 0–44)
AST: 47 U/L — ABNORMAL HIGH (ref 15–41)
Albumin: 3.7 g/dL (ref 3.5–5.0)
Alkaline Phosphatase: 95 U/L (ref 38–126)
Anion gap: 10 (ref 5–15)
BUN: 13 mg/dL (ref 8–23)
CO2: 26 mmol/L (ref 22–32)
Calcium: 9.2 mg/dL (ref 8.9–10.3)
Chloride: 93 mmol/L — ABNORMAL LOW (ref 98–111)
Creatinine, Ser: 0.71 mg/dL (ref 0.61–1.24)
GFR, Estimated: >60 mL/min (ref >60)
Glucose, Bld: 132 mg/dL — ABNORMAL HIGH (ref 70–99)
Potassium: 3.2 mmol/L — ABNORMAL LOW (ref 3.5–5.1)
Sodium: 129 mmol/L — ABNORMAL LOW (ref 135–145)
Total Bilirubin: 1.6 mg/dL — ABNORMAL HIGH (ref 0.3–1.2)
Total Protein: 6.8 g/dL (ref 6.5–8.1)

## 2021-06-06 LAB — PROTIME-INR
INR: 1 (ref 0.8–1.2)
Prothrombin Time: 13.1 seconds (ref 11.4–15.2)

## 2021-06-06 LAB — HEMOGLOBIN A1C
Hgb A1c MFr Bld: 5 % (ref 4.8–5.6)
Mean Plasma Glucose: 96.8 mg/dL

## 2021-06-06 LAB — APTT: aPTT: 26 seconds (ref 24–36)

## 2021-06-06 LAB — RESP PANEL BY RT-PCR (FLU A&B, COVID) ARPGX2
Influenza A by PCR: NEGATIVE
Influenza B by PCR: NEGATIVE
SARS Coronavirus 2 by RT PCR: NEGATIVE

## 2021-06-06 SURGERY — CORONARY/GRAFT ACUTE MI REVASCULARIZATION
Anesthesia: LOCAL

## 2021-06-06 MED ORDER — CANGRELOR TETRASODIUM 50 MG IV SOLR
INTRAVENOUS | Status: AC
  Filled 2021-06-06: qty 50

## 2021-06-06 MED ORDER — LIDOCAINE HCL (PF) 1 % IJ SOLN
INTRAMUSCULAR | Status: AC
  Filled 2021-06-06: qty 30

## 2021-06-06 MED ORDER — METHYLPREDNISOLONE SODIUM SUCC 125 MG IJ SOLR
125.0000 mg | Freq: Once | INTRAMUSCULAR | Status: AC
  Administered 2021-06-06: 125 mg via INTRAVENOUS

## 2021-06-06 MED ORDER — TICAGRELOR 90 MG PO TABS
ORAL_TABLET | ORAL | Status: AC
  Filled 2021-06-06: qty 2

## 2021-06-06 MED ORDER — DIPHENHYDRAMINE HCL 50 MG/ML IJ SOLN
25.0000 mg | Freq: Once | INTRAMUSCULAR | Status: AC
  Administered 2021-06-06: 25 mg via INTRAVENOUS

## 2021-06-06 MED ORDER — CANGRELOR BOLUS VIA INFUSION
INTRAVENOUS | Status: DC | PRN
  Administered 2021-06-06: 2109 ug via INTRAVENOUS

## 2021-06-06 MED ORDER — TICAGRELOR 90 MG PO TABS
ORAL_TABLET | ORAL | Status: DC | PRN
  Administered 2021-06-06: 180 mg via ORAL

## 2021-06-06 MED ORDER — HEPARIN (PORCINE) IN NACL 1000-0.9 UT/500ML-% IV SOLN
INTRAVENOUS | Status: AC
  Filled 2021-06-06: qty 1000

## 2021-06-06 MED ORDER — ADENOSINE 6 MG/2ML IV SOLN
INTRAVENOUS | Status: AC
  Filled 2021-06-06: qty 2

## 2021-06-06 MED ORDER — VERAPAMIL HCL 2.5 MG/ML IV SOLN
INTRAVENOUS | Status: DC | PRN
  Administered 2021-06-06: 10 mL via INTRA_ARTERIAL

## 2021-06-06 MED ORDER — NITROGLYCERIN 1 MG/10 ML FOR IR/CATH LAB
INTRA_ARTERIAL | Status: DC | PRN
  Administered 2021-06-06 (×2): 200 ug via INTRACORONARY

## 2021-06-06 MED ORDER — DIPHENHYDRAMINE HCL 50 MG/ML IJ SOLN
INTRAMUSCULAR | Status: AC
  Filled 2021-06-06: qty 1

## 2021-06-06 MED ORDER — SODIUM CHLORIDE 0.9 % IV SOLN
INTRAVENOUS | Status: DC
  Administered 2021-06-06: 50 mL/h via INTRAVENOUS

## 2021-06-06 MED ORDER — ADENOSINE (DIAGNOSTIC) FOR INTRACORONARY USE
INTRAVENOUS | Status: DC | PRN
  Administered 2021-06-06 (×4): 60 ug via INTRACORONARY

## 2021-06-06 MED ORDER — HEPARIN SODIUM (PORCINE) 5000 UNIT/ML IJ SOLN
4000.0000 [IU] | Freq: Once | INTRAMUSCULAR | Status: AC
  Administered 2021-06-06: 4000 [IU] via INTRAVENOUS

## 2021-06-06 MED ORDER — SODIUM CHLORIDE 0.9 % IV SOLN
INTRAVENOUS | Status: AC | PRN
  Administered 2021-06-06: 4 ug/kg/min via INTRAVENOUS

## 2021-06-06 MED ORDER — LIDOCAINE HCL (PF) 1 % IJ SOLN
INTRAMUSCULAR | Status: DC | PRN
  Administered 2021-06-06: 2 mL

## 2021-06-06 MED ORDER — HEPARIN SODIUM (PORCINE) 1000 UNIT/ML IJ SOLN
INTRAMUSCULAR | Status: DC | PRN
  Administered 2021-06-06: 4000 [IU] via INTRAVENOUS
  Administered 2021-06-06: 2000 [IU] via INTRAVENOUS

## 2021-06-06 MED ORDER — VERAPAMIL HCL 2.5 MG/ML IV SOLN
INTRAVENOUS | Status: AC
  Filled 2021-06-06: qty 2

## 2021-06-06 MED ORDER — HEPARIN (PORCINE) IN NACL 1000-0.9 UT/500ML-% IV SOLN
INTRAVENOUS | Status: DC | PRN
  Administered 2021-06-06 (×2): 500 mL

## 2021-06-06 MED ORDER — METHYLPREDNISOLONE SODIUM SUCC 125 MG IJ SOLR
INTRAMUSCULAR | Status: AC
  Filled 2021-06-06: qty 2

## 2021-06-06 MED ORDER — ASPIRIN 81 MG PO CHEW: 324.0000 mg | CHEWABLE_TABLET | Freq: Once | ORAL | Status: DC

## 2021-06-06 MED ORDER — HEPARIN SODIUM (PORCINE) 5000 UNIT/ML IJ SOLN
INTRAMUSCULAR | Status: AC
  Filled 2021-06-06: qty 1

## 2021-06-06 MED ORDER — NITROGLYCERIN 1 MG/10 ML FOR IR/CATH LAB
INTRA_ARTERIAL | Status: AC
  Filled 2021-06-06: qty 10

## 2021-06-06 SURGICAL SUPPLY — 20 items
BALLN SAPPHIRE 2.25X10 (BALLOONS) ×2
BALLN SAPPHIRE ~~LOC~~ 2.25X12 (BALLOONS) ×1 IMPLANT
BALLN SAPPHIRE ~~LOC~~ 3.25X8 (BALLOONS) ×1 IMPLANT
BALLN SAPPHIRE ~~LOC~~ 3.5X8 (BALLOONS) IMPLANT
BALLOON SAPPHIRE 2.25X10 (BALLOONS) IMPLANT
CATH 5FR JL3.5 JR4 ANG PIG MP (CATHETERS) ×1 IMPLANT
CATH LAUNCHER 6FR EBU3.5 (CATHETERS) ×1 IMPLANT
DEVICE RAD COMP TR BAND LRG (VASCULAR PRODUCTS) ×1 IMPLANT
GLIDESHEATH SLEND SS 6F .021 (SHEATH) ×1 IMPLANT
GUIDEWIRE INQWIRE 1.5J.035X260 (WIRE) IMPLANT
INQWIRE 1.5J .035X260CM (WIRE) ×2
KIT ENCORE 26 ADVANTAGE (KITS) ×1 IMPLANT
KIT HEART LEFT (KITS) ×2 IMPLANT
PACK CARDIAC CATHETERIZATION (CUSTOM PROCEDURE TRAY) ×2 IMPLANT
STENT ONYX FRONTIER 2.0X15 (Permanent Stent) ×1 IMPLANT
STENT ONYX FRONTIER 3.0X12 (Permanent Stent) ×1 IMPLANT
TRANSDUCER W/STOPCOCK (MISCELLANEOUS) ×2 IMPLANT
TUBING CIL FLEX 10 FLL-RA (TUBING) ×2 IMPLANT
WIRE HI TORQ VERSACORE-J 145CM (WIRE) ×1 IMPLANT
WIRE RUNTHROUGH .014X180CM (WIRE) ×1 IMPLANT

## 2021-06-06 NOTE — Progress Notes (Signed)
°   06/06/21 2205  Clinical Encounter Type  Visited With Health care provider;Patient not available  Visit Type ED;Code;Initial  Referral From Nurse  Consult/Referral To Chaplain   Response to E.D. Code STEMI, Patient with care team. No family available at this time. [place], M.Min.will be available at [phone].

## 2021-06-06 NOTE — ED Triage Notes (Signed)
Pt here from [place] as a stemi , started having chest pain this past Thursday , no n/v or sob

## 2021-06-06 NOTE — ED Provider Notes (Signed)
[facility] EMERGENCY DEPARTMENT Provider Note   CSN: [id number] Arrival date & time: 06/06/21  2230     History  No chief complaint on file.   Gabriel [name] is a 88 y.o. male.  HPI     Pt comes in with cc of chest pain. Patient has history of hypertension, no history of CAD. Patient states that has been having off-and-on chest pain over the last week.  Today the symptoms were more pronounced, he decided to call EMS.   Patient currently chest pain-free.  EMS indicated that they gave him full dose aspirin.  Review of systems negative for shortness of breath, sweats.  Home Medications Prior to Admission medications   Medication Sig Start Date End Date Taking? Authorizing Provider  aspirin 81 MG tablet Take 81 mg by mouth daily.    [provider]  gabapentin (NEURONTIN) 300 MG capsule TAKE 1 CAPSULE BY MOUTH THREE TIMES A DAY 02/11/21   Laurey Morale, MD  hydrochlorothiazide (HYDRODIURIL) 25 MG tablet TAKE 1 TABLET BY MOUTH EVERY DAY IN THE MORNING 02/11/21   Laurey Morale, MD  meloxicam (MOBIC) 15 MG tablet TAKE 1/2 TAB BY [facility] DAILY 11/17/20   Laurey Morale, MD  Multiple Vitamin (MULTIVITAMIN WITH MINERALS) TABS tablet Take 1 tablet by mouth every morning.    [provider]  pantoprazole (PROTONIX) 40 MG tablet TAKE 1 TABLET BY MOUTH EVERY DAY 02/11/21   Laurey Morale, MD  potassium chloride (KLOR-CON) 10 MEQ tablet TAKE 1 TABLET BY MOUTH EVERY DAY 02/11/21   Laurey Morale, MD  vitamin C (ASCORBIC ACID) 500 MG tablet Take 500 mg by mouth daily. Reported on 10/27/2015    [provider]      Allergies    Contrast media [iodinated contrast media]    Review of Systems   Review of Systems  Unable to perform ROS: Acuity of condition   Physical Exam Updated Vital Signs BP (!) 184/109    Pulse 86    Temp (!) 97.5 F (36.4 C) (Oral)    Resp 13    SpO2 99%  Physical Exam Vitals and nursing note reviewed.  Constitutional:       Appearance: He is well-developed.  HENT:     Head: Atraumatic.  Cardiovascular:     Rate and Rhythm: Normal rate.     Comments: Intact and equal distal pulses bilaterally -radial and dorsalis pedis Pulmonary:     Effort: Pulmonary effort is normal.  Musculoskeletal:        General: No swelling.     Cervical back: Neck supple.  Skin:    General: Skin is warm.  Neurological:     Mental Status: He is alert and oriented to person, place, and time.    ED Results / Procedures / Treatments   Labs (all labs ordered are listed, but only abnormal results are displayed) Labs Reviewed  RESP PANEL BY RT-PCR (FLU A&B, COVID) ARPGX2  HEMOGLOBIN A1C  CBC WITH DIFFERENTIAL/PLATELET  PROTIME-INR  APTT  COMPREHENSIVE METABOLIC PANEL  LIPID PANEL  TROPONIN I (HIGH SENSITIVITY)    EKG None  Radiology No results found.  Procedures .Critical Care Performed by: Varney Biles, MD Authorized by: Varney Biles, MD   Critical care provider statement:    Critical care time (minutes):  30   Critical care was necessary to treat or prevent imminent or life-threatening deterioration of the following conditions:  Cardiac failure   Critical care was time  spent personally by me on the following activities:  Development of treatment plan with patient or surrogate, discussions with consultants, evaluation of patient's response to treatment, examination of patient, ordering and review of laboratory studies, ordering and review of radiographic studies, ordering and performing treatments and interventions, pulse oximetry, re-evaluation of patient's condition and review of old charts    Medications Ordered in ED Medications  0.9 %  sodium chloride infusion (has no administration in time range)  aspirin chewable tablet 324 mg (has no administration in time range)  heparin injection 4,000 Units (has no administration in time range)    ED Course/ Medical Decision Making/ A&P                            Medical Decision Making Problems Addressed: ST elevation myocardial infarction (STEMI), unspecified artery ([place]): acute illness or injury that poses a threat to life or bodily functions  Amount and/or Complexity of Data Reviewed Independent Historian: EMS External Data Reviewed: notes.    Details: Primary care visit from recent visit.  It appears that patient's blood pressure was being optimized at that time Labs: ordered. ECG/medicine tests: ordered and independent interpretation performed.  Risk OTC drugs. Prescription drug management. Decision regarding hospitalization.   88 year old male comes in with chief complaint of chest pain.  EMS sent the EKG to [place], I reviewed patient's EKG and STEMI was activated on field  Patient has been having intermittent chest pain.  Currently chest pain-free.  EKG reveals anterior STEMI.  Bilateral equal radial and dorsalis pedis pulse.  Heparin bolus given.  EMS has given aspirin already.  Cardiology consulted and at bedside.  Patient is amenable for catheterization. Slight hypertension noted here.   Final Clinical Impression(s) / ED Diagnoses Final diagnoses:  ST elevation myocardial infarction (STEMI), unspecified artery [facility])    Rx / DC Orders ED Discharge Orders     None         Varney Biles, MD 06/06/21 2326

## 2021-06-06 NOTE — H&P (Signed)
Cardiology Admission History and Physical:   Patient ID: Gabriel [name] MRN: [id number]; DOB: [birth year]   Admission date: 06/06/2021  PCP:  Laurey Morale, MD   [facility] Providers Cardiologist:  None        Chief Complaint:  Chest pain  Patient Profile:   Gabriel [name] is a 88 y.o. male with pmh sx for HTN who is being seen 06/06/2021 for the evaluation of Anterior STEMI  History of Present Illness:   Gabriel [name] is a 88 y.o. male with pmh sx for HTN who is being seen 06/06/2021 for the evaluation of STEMI. He has no prior cardiac history except HTN for which he takes HCTZ. Only other medications he takes is PPI for GERD. He reports he has been having on and off chest pain since past 1 week but he did not pay much attention. However, the pain really worsened today- and was sustained hence had to call the EMS. EMS indicated that they gave him full dose aspirin and the EKG showed STEMI hence code STEMI was activated. When he arrived here, he was hypertensive to 180s/110s, saturating well on RA. EKG confirmed STEMI. He stated he is allergic to dye hence was given 25 mg bendryl and 125 mg solumedrol in the ED. 4000 heparin was also given, and he was taken to the cath lab. Currently chest pain-free.    Past Medical History:  Diagnosis Date   Arthritis    Diverticulosis of colon    GERD (gastroesophageal reflux disease)    Hypertension     Past Surgical History:  Procedure Laterality Date   cataract extaction, right  6/09   per Dr. [name]   CATARACT EXTRACTION Left    COLONOSCOPY  09/23/04   per Dr. [name], clear, no repeats needed    EGD and esophageal diatation,  05-10-11   per Dr. [name]     HERNIA REPAIR     rt inguinal hernia     x2     Medications Prior to Admission: Prior to Admission medications   Medication Sig Start Date End Date Taking? Authorizing Provider  aspirin 81 MG tablet Take 81 mg by mouth daily.    [provider]  gabapentin (NEURONTIN)  300 MG capsule TAKE 1 CAPSULE BY MOUTH THREE TIMES A DAY 02/11/21   Laurey Morale, MD  hydrochlorothiazide (HYDRODIURIL) 25 MG tablet TAKE 1 TABLET BY MOUTH EVERY DAY IN THE MORNING 02/11/21   Laurey Morale, MD  meloxicam (MOBIC) 15 MG tablet TAKE 1/2 TAB BY [facility] DAILY 11/17/20   Laurey Morale, MD  Multiple Vitamin (MULTIVITAMIN WITH MINERALS) TABS tablet Take 1 tablet by mouth every morning.    [provider]  pantoprazole (PROTONIX) 40 MG tablet TAKE 1 TABLET BY MOUTH EVERY DAY 02/11/21   Laurey Morale, MD  potassium chloride (KLOR-CON) 10 MEQ tablet TAKE 1 TABLET BY MOUTH EVERY DAY 02/11/21   Laurey Morale, MD  vitamin C (ASCORBIC ACID) 500 MG tablet Take 500 mg by mouth daily. Reported on 10/27/2015    [provider]     Allergies:    Allergies  Allergen Reactions   Contrast Media [Iodinated Contrast Media] Rash    Social History:   Social History   Socioeconomic History   Marital status: Widowed    Spouse name: Not on file   Number of children: 3   Years of education: Not on file   Highest education level: Not on file  Occupational  History   Occupation: retired  Tobacco Use   Smoking status: Former    Packs/day: 15.00    Years: 0.50    Pack years: 7.50    Types: Cigarettes   Smokeless tobacco: Never   Tobacco comments:    smoked 18 and stopped and states he stopped 30's   Vaping Use   Vaping Use: Never used  Substance and Sexual Activity   Alcohol use: No    Alcohol/week: 0.0 standard drinks   Drug use: No   Sexual activity: Never    Birth control/protection: Abstinence  Other Topics Concern   Not on file  Social History Narrative   Lives in studio at [place]   3 children   Social Determinants of Health   Financial Resource Strain: Low Risk    Difficulty of Paying Living Expenses: Not hard at all  Food Insecurity: No Food Insecurity   Worried About Charity fundraiser in the Last Year: Never true   Arboriculturist in  the Last Year: Never true  Transportation Needs: No Transportation Needs   Lack of Transportation (Medical): No   Lack of Transportation (Non-Medical): No  Physical Activity: Insufficiently Active   Days of Exercise per Week: 7 days   Minutes of Exercise per Session: 20 min  Stress: No Stress Concern Present   Feeling of Stress : Not at all  Social Connections: Moderately Isolated   Frequency of Communication with Friends and Family: More than three times a week   Frequency of Social Gatherings with Friends and Family: More than three times a week   Attends Religious Services: 1 to 4 times per year   Active Member of [employer] or Organizations: No   Attends Archivist Meetings: Never   Marital Status: Widowed  Human resources officer Violence: Not At Risk   Fear of Current or Ex-Partner: No   Emotionally Abused: No   Physically Abused: No   Sexually Abused: No    Family History:   The patient's family history includes Coronary artery disease in his father. There is no history of Colon cancer, Stomach cancer, or Esophageal cancer.    ROS:  Please see the history of present illness.  All other ROS reviewed and negative.     Physical Exam/Data:   Vitals:   06/06/21 2236  BP: (!) 184/109  Pulse: 86  Resp: 13  Temp: (!) 97.5 F (36.4 C)  TempSrc: Oral  SpO2: 99%   No intake or output data in the 24 hours ending 06/06/21 2341 Last 3 Weights 06/04/2021 12/03/2020 11/28/2020  Weight (lbs) 155 lb 160 lb 157 lb  Weight (kg) 70.308 kg 72.576 kg 71.215 kg     There is no height or weight on file to calculate BMI.  General:  Well nourished, well developed, in no acute distress.  HEENT: normal Neck: no JVD Vascular: No carotid bruits; Distal pulses 2+ bilaterally   Cardiac:  normal S1, S2; RRR;  Lungs:  Poor airflow Abd: soft, nontender, no hepatomegaly  Ext: no edema Musculoskeletal:  No deformities, BUE and BLE strength normal and equal Skin: warm and dry  Neuro:  CNs 2-12  intact, no focal abnormalities noted Psych:  Normal affect    EKG:  The ECG that was done was personally reviewed and demonstrates anterior STEMI   Laboratory Data:  High Sensitivity Troponin:   Recent Labs  Lab 06/06/21 2240  TROPONINIHS 1,712*      Chemistry Recent Labs  Lab 06/06/21 2240 06/06/21 2247  NA 129* 130*  K 3.2* 3.3*  CL 93* 90*  CO2 26  --   GLUCOSE 132* 125*  BUN 13 15  CREATININE 0.71 0.60*  CALCIUM 9.2  --   GFRNONAA >60  --   ANIONGAP 10  --     Recent Labs  Lab 06/06/21 2240  PROT 6.8  ALBUMIN 3.7  AST 47*  ALT 30  ALKPHOS 95  BILITOT 1.6*   Lipids  Recent Labs  Lab 06/06/21 2238  CHOL 151  TRIG 151*  HDL 44  LDLCALC 77  CHOLHDL 3.4   Hematology Recent Labs  Lab 06/06/21 2240 06/06/21 2247  WBC 9.5  --   RBC 5.07  --   HGB 16.0 16.3  HCT 44.8 48.0  MCV 88.4  --   MCH 31.6  --   MCHC 35.7  --   RDW 13.0  --   PLT 303  --    Thyroid No results for input(s): TSH, FREET4 in the last 168 hours. BNPNo results for input(s): BNP, PROBNP in the last 168 hours.  DDimer No results for input(s): DDIMER in the last 168 hours.   Radiology/Studies:  No results found.   Assessment and Plan:   # Acute Anterior STEMI -Load with aspirin and heparin -Immediately to the cath lab for revasc -Echo in AM -Atorvastatin 80 mg -Low dose beta blocker -Heparin drip -Aspirin and Brilinta post procedure -Hydralazine PRN for BP  For questions or updates, please contact [place] Please consult [email or web address] for contact info under     Signed, Jaci Lazier, MD  06/06/2021 11:41 PM

## 2021-06-06 NOTE — ED Notes (Signed)
Pt transported to Cath lab , report given to RN

## 2021-06-07 ENCOUNTER — Other Ambulatory Visit (HOSPITAL_COMMUNITY): Payer: Medicare Other

## 2021-06-07 ENCOUNTER — Inpatient Hospital Stay (HOSPITAL_COMMUNITY): Payer: Medicare Other

## 2021-06-07 ENCOUNTER — Other Ambulatory Visit: Payer: Self-pay

## 2021-06-07 DIAGNOSIS — I213 ST elevation (STEMI) myocardial infarction of unspecified site: Secondary | ICD-10-CM | POA: Diagnosis present

## 2021-06-07 DIAGNOSIS — I2510 Atherosclerotic heart disease of native coronary artery without angina pectoris: Secondary | ICD-10-CM | POA: Diagnosis present

## 2021-06-07 DIAGNOSIS — E785 Hyperlipidemia, unspecified: Secondary | ICD-10-CM | POA: Diagnosis present

## 2021-06-07 DIAGNOSIS — J439 Emphysema, unspecified: Secondary | ICD-10-CM | POA: Diagnosis not present

## 2021-06-07 DIAGNOSIS — Z91041 Radiographic dye allergy status: Secondary | ICD-10-CM | POA: Diagnosis not present

## 2021-06-07 DIAGNOSIS — I493 Ventricular premature depolarization: Secondary | ICD-10-CM | POA: Diagnosis present

## 2021-06-07 DIAGNOSIS — Z7982 Long term (current) use of aspirin: Secondary | ICD-10-CM | POA: Diagnosis not present

## 2021-06-07 DIAGNOSIS — I10 Essential (primary) hypertension: Secondary | ICD-10-CM | POA: Diagnosis present

## 2021-06-07 DIAGNOSIS — I2102 ST elevation (STEMI) myocardial infarction involving left anterior descending coronary artery: Secondary | ICD-10-CM | POA: Diagnosis present

## 2021-06-07 DIAGNOSIS — J449 Chronic obstructive pulmonary disease, unspecified: Secondary | ICD-10-CM | POA: Diagnosis not present

## 2021-06-07 DIAGNOSIS — K219 Gastro-esophageal reflux disease without esophagitis: Secondary | ICD-10-CM | POA: Diagnosis present

## 2021-06-07 DIAGNOSIS — R079 Chest pain, unspecified: Secondary | ICD-10-CM | POA: Diagnosis not present

## 2021-06-07 DIAGNOSIS — Z9841 Cataract extraction status, right eye: Secondary | ICD-10-CM | POA: Diagnosis not present

## 2021-06-07 DIAGNOSIS — I480 Paroxysmal atrial fibrillation: Secondary | ICD-10-CM | POA: Diagnosis present

## 2021-06-07 DIAGNOSIS — Z79899 Other long term (current) drug therapy: Secondary | ICD-10-CM | POA: Diagnosis not present

## 2021-06-07 DIAGNOSIS — Z20822 Contact with and (suspected) exposure to covid-19: Secondary | ICD-10-CM | POA: Diagnosis present

## 2021-06-07 LAB — BASIC METABOLIC PANEL
Anion gap: 12 (ref 5–15)
BUN: 14 mg/dL (ref 8–23)
CO2: 21 mmol/L — ABNORMAL LOW (ref 22–32)
Calcium: 8.6 mg/dL — ABNORMAL LOW (ref 8.9–10.3)
Chloride: 93 mmol/L — ABNORMAL LOW (ref 98–111)
Creatinine, Ser: 0.61 mg/dL (ref 0.61–1.24)
GFR, Estimated: >60 mL/min (ref >60)
Glucose, Bld: 148 mg/dL — ABNORMAL HIGH (ref 70–99)
Potassium: 2.9 mmol/L — ABNORMAL LOW (ref 3.5–5.1)
Sodium: 126 mmol/L — ABNORMAL LOW (ref 135–145)

## 2021-06-07 LAB — HEPARIN LEVEL (UNFRACTIONATED): Heparin Unfractionated: 0.36 IU/mL (ref 0.30–0.70)

## 2021-06-07 LAB — POCT ACTIVATED CLOTTING TIME
Activated Clotting Time: 281 seconds
Activated Clotting Time: 341 seconds

## 2021-06-07 LAB — CBC
HCT: 43.1 % (ref 39.0–52.0)
Hemoglobin: 15 g/dL (ref 13.0–17.0)
MCH: 30.9 pg (ref 26.0–34.0)
MCHC: 34.8 g/dL (ref 30.0–36.0)
MCV: 88.7 fL (ref 80.0–100.0)
Platelets: 290 10*3/uL (ref 150–400)
RBC: 4.86 MIL/uL (ref 4.22–5.81)
RDW: 12.7 % (ref 11.5–15.5)
WBC: 13.4 10*3/uL — ABNORMAL HIGH (ref 4.0–10.5)
nRBC: 0 % (ref 0.0–0.2)

## 2021-06-07 LAB — MRSA NEXT GEN BY PCR, NASAL: MRSA by PCR Next Gen: NOT DETECTED

## 2021-06-07 LAB — MAGNESIUM: Magnesium: 1.6 mg/dL — ABNORMAL LOW (ref 1.7–2.4)

## 2021-06-07 LAB — TROPONIN I (HIGH SENSITIVITY): Troponin I (High Sensitivity): 2651 ng/L (ref <18)

## 2021-06-07 MED ORDER — ASPIRIN EC 81 MG PO TBEC
81.0000 mg | DELAYED_RELEASE_TABLET | Freq: Every day | ORAL | Status: DC
  Administered 2021-06-07 – 2021-06-10 (×4): 81 mg via ORAL
  Filled 2021-06-07 (×4): qty 1

## 2021-06-07 MED ORDER — ATORVASTATIN CALCIUM 40 MG PO TABS
40.0000 mg | ORAL_TABLET | Freq: Every day | ORAL | Status: DC
  Administered 2021-06-07 – 2021-06-10 (×4): 40 mg via ORAL
  Filled 2021-06-07 (×4): qty 1

## 2021-06-07 MED ORDER — TICAGRELOR 90 MG PO TABS
90.0000 mg | ORAL_TABLET | Freq: Two times a day (BID) | ORAL | Status: DC
  Administered 2021-06-07 – 2021-06-10 (×7): 90 mg via ORAL
  Filled 2021-06-07 (×7): qty 1

## 2021-06-07 MED ORDER — SODIUM CHLORIDE 0.9% FLUSH: 3.0000 mL | INTRAVENOUS | Status: DC | PRN

## 2021-06-07 MED ORDER — MAGNESIUM SULFATE 4 GM/100ML IV SOLN
4.0000 g | Freq: Once | INTRAVENOUS | Status: AC
  Administered 2021-06-07: 4 g via INTRAVENOUS
  Filled 2021-06-07: qty 100

## 2021-06-07 MED ORDER — SODIUM CHLORIDE 0.9 % IV SOLN: 250.0000 mL | INTRAVENOUS | Status: DC | PRN

## 2021-06-07 MED ORDER — METOPROLOL TARTRATE 12.5 MG HALF TABLET
12.5000 mg | ORAL_TABLET | Freq: Two times a day (BID) | ORAL | Status: DC
  Administered 2021-06-07 (×2): 12.5 mg via ORAL
  Filled 2021-06-07 (×3): qty 1

## 2021-06-07 MED ORDER — SODIUM CHLORIDE 0.9 % IV SOLN: INTRAVENOUS | Status: AC

## 2021-06-07 MED ORDER — GABAPENTIN 300 MG PO CAPS
300.0000 mg | ORAL_CAPSULE | Freq: Three times a day (TID) | ORAL | Status: DC
  Administered 2021-06-07 – 2021-06-10 (×10): 300 mg via ORAL
  Filled 2021-06-07 (×10): qty 1

## 2021-06-07 MED ORDER — CHLORHEXIDINE GLUCONATE CLOTH 2 % EX PADS
6.0000 | MEDICATED_PAD | Freq: Every day | CUTANEOUS | Status: DC
  Administered 2021-06-07 – 2021-06-08 (×3): 6 via TOPICAL

## 2021-06-07 MED ORDER — HYDRALAZINE HCL 20 MG/ML IJ SOLN: 10.0000 mg | INTRAMUSCULAR | Status: AC | PRN

## 2021-06-07 MED ORDER — SODIUM CHLORIDE 0.9 % IV SOLN
4.0000 ug/kg/min | INTRAVENOUS | Status: AC
  Filled 2021-06-07: qty 50

## 2021-06-07 MED ORDER — POTASSIUM CHLORIDE CRYS ER 20 MEQ PO TBCR
40.0000 meq | EXTENDED_RELEASE_TABLET | ORAL | Status: AC
  Administered 2021-06-07 (×2): 40 meq via ORAL
  Filled 2021-06-07 (×2): qty 2

## 2021-06-07 MED ORDER — ACETAMINOPHEN 325 MG PO TABS: 650.0000 mg | ORAL_TABLET | ORAL | Status: DC | PRN

## 2021-06-07 MED ORDER — IOHEXOL 350 MG/ML SOLN
INTRAVENOUS | Status: DC | PRN
  Administered 2021-06-06: 160 mL

## 2021-06-07 MED ORDER — LABETALOL HCL 5 MG/ML IV SOLN: 10.0000 mg | INTRAVENOUS | Status: AC | PRN

## 2021-06-07 MED ORDER — PANTOPRAZOLE SODIUM 40 MG PO TBEC
40.0000 mg | DELAYED_RELEASE_TABLET | Freq: Every day | ORAL | Status: DC
  Administered 2021-06-07 – 2021-06-10 (×4): 40 mg via ORAL
  Filled 2021-06-07 (×4): qty 1

## 2021-06-07 MED ORDER — HEPARIN (PORCINE) 25000 UT/250ML-% IV SOLN
1050.0000 [IU]/h | INTRAVENOUS | Status: DC
  Administered 2021-06-07 – 2021-06-08 (×2): 950 [IU]/h via INTRAVENOUS
  Filled 2021-06-07 (×2): qty 250

## 2021-06-07 MED ORDER — ONDANSETRON HCL 4 MG/2ML IJ SOLN: 4.0000 mg | Freq: Four times a day (QID) | INTRAMUSCULAR | Status: DC | PRN

## 2021-06-07 MED ORDER — SODIUM CHLORIDE 0.9% FLUSH
3.0000 mL | Freq: Two times a day (BID) | INTRAVENOUS | Status: DC
  Administered 2021-06-07 – 2021-06-09 (×4): 3 mL via INTRAVENOUS

## 2021-06-07 NOTE — Progress Notes (Signed)
ANTICOAGULATION CONSULT NOTE - Initial Consult  Pharmacy Consult for Heparin Indication: atrial fibrillation  Allergies  Allergen Reactions   Contrast Media [Iodinated Contrast Media] Rash    Patient Measurements: Height: 5\' 7"  (170.2 cm) Weight: 70.1 kg (154 lb 8.7 oz) IBW/kg (Calculated) : 66.1  Vital Signs: Temp: 97.8 F (36.6 C) (02/12 1614) Temp Source: Oral (02/12 1614) BP: 117/56 (02/12 1610) Pulse Rate: 64 (02/12 1610)  Labs: Recent Labs    06/06/21 2240 06/06/21 2247 06/07/21 0135 06/07/21 1551  HGB 16.0 16.3 15.0  --   HCT 44.8 48.0 43.1  --   PLT 303  --  290  --   APTT 26  --   --   --   LABPROT 13.1  --   --   --   INR 1.0  --   --   --   HEPARINUNFRC  --   --   --  0.36  CREATININE 0.71 0.60* 0.61  --   TROPONINIHS 1,712*  --  2,651*  --      Estimated Creatinine Clearance: 52.8 mL/min (by C-G formula based on SCr of 0.61 mg/dL).   Medical History: Past Medical History:  Diagnosis Date   Arthritis    Diverticulosis of colon    GERD (gastroesophageal reflux disease)    Hypertension     Medications:  Scheduled:   aspirin EC  81 mg Oral Daily   atorvastatin  40 mg Oral Daily   Chlorhexidine Gluconate Cloth  6 each Topical Daily   gabapentin  300 mg Oral TID   metoprolol tartrate  12.5 mg Oral BID   pantoprazole  40 mg Oral Daily   sodium chloride flush  3 mL Intravenous Q12H   ticagrelor  90 mg Oral BID    Assessment: 88 y.o. male admitted with STEMI now s/p PCI, new onset atrial fibrillation, for heparin.  Heparin to start 2 hours after TR band deflation  Heparin drip 950 uts/hr Heparin level 0.36 at goal  HGB stable no bleeding noted   Goal of Therapy:  Heparin level 0.3-0.7 units/ml Monitor platelets by anticoagulation protocol: Yes   Plan:  Continue heparin 950 units/hr  Daily CBC and heparin level  Bonnita Nasuti Pharm.D. CPP, BCPS Clinical Pharmacist [phone] 06/07/2021 6:30 PM

## 2021-06-07 NOTE — Plan of Care (Signed)
Pt arrived 2H06 from cath lab, no complaints of pain, AO4, MAEW, VSS. Hematoma noted at r radial cath site, Dr. Saunders Revel bedside and aware. Pt belongings accounted for and charted, pt daughter bedside all questions answered.    Problem: Education: Goal: Knowledge of General Education information will improve Description: Including pain rating scale, medication(s)/side effects and non-pharmacologic comfort measures Outcome: Progressing   Problem: Health Behavior/Discharge Planning: Goal: Ability to manage health-related needs will improve Outcome: Progressing   Problem: Clinical Measurements: Goal: Ability to maintain clinical measurements within normal limits will improve Outcome: Progressing Goal: Will remain free from infection Outcome: Progressing Goal: Diagnostic test results will improve Outcome: Progressing Goal: Respiratory complications will improve Outcome: Progressing Goal: Cardiovascular complication will be avoided Outcome: Progressing

## 2021-06-07 NOTE — Progress Notes (Signed)
ANTICOAGULATION CONSULT NOTE - Initial Consult  Pharmacy Consult for Heparin Indication: atrial fibrillation  Allergies  Allergen Reactions   Contrast Media [Iodinated Contrast Media] Rash    Patient Measurements: Height: 5\' 7"  (170.2 cm) Weight: 70.1 kg (154 lb 8.7 oz) IBW/kg (Calculated) : 66.1  Vital Signs: Temp: 97.7 F (36.5 C) (02/12 0200) Temp Source: Oral (02/12 0030) BP: 155/84 (02/12 0515) Pulse Rate: 66 (02/12 0515)  Labs: Recent Labs    06/06/21 2240 06/06/21 2247 06/07/21 0135  HGB 16.0 16.3 15.0  HCT 44.8 48.0 43.1  PLT 303  --  290  APTT 26  --   --   LABPROT 13.1  --   --   INR 1.0  --   --   CREATININE 0.71 0.60* 0.61  TROPONINIHS 1,712*  --  2,651*    Estimated Creatinine Clearance: 52.8 mL/min (by C-G formula based on SCr of 0.61 mg/dL).   Medical History: Past Medical History:  Diagnosis Date   Arthritis    Diverticulosis of colon    GERD (gastroesophageal reflux disease)    Hypertension     Medications:  Scheduled:   aspirin EC  81 mg Oral Daily   atorvastatin  40 mg Oral Daily   Chlorhexidine Gluconate Cloth  6 each Topical Daily   diphenhydrAMINE       gabapentin  300 mg Oral TID   heparin       methylPREDNISolone sodium succinate       metoprolol tartrate  12.5 mg Oral BID   pantoprazole  40 mg Oral Daily   sodium chloride flush  3 mL Intravenous Q12H   ticagrelor  90 mg Oral BID    Assessment: 88 y.o. male admitted with STEMI now s/p PCI, new onset atrial fibrillation, for heparin.  Heparin to start 2 hours after TR band deflation Goal of Therapy:  Heparin level 0.3-0.7 units/ml Monitor platelets by anticoagulation protocol: Yes   Plan:  Start heparin 950 units/hr at 0800 Check heparin level in 8 hours.   Caryl Pina 06/07/2021,5:45 AM

## 2021-06-07 NOTE — Progress Notes (Signed)
Progress Note  Patient Name: Gabriel [name] Date of Encounter: 06/07/2021  [facility] Cardiologist: None   Subjective   NAEO. No CP. Family at bedside.  Inpatient Medications    Scheduled Meds:  aspirin EC  81 mg Oral Daily   atorvastatin  40 mg Oral Daily   Chlorhexidine Gluconate Cloth  6 each Topical Daily   diphenhydrAMINE       gabapentin  300 mg Oral TID   heparin       methylPREDNISolone sodium succinate       metoprolol tartrate  12.5 mg Oral BID   pantoprazole  40 mg Oral Daily   sodium chloride flush  3 mL Intravenous Q12H   ticagrelor  90 mg Oral BID   Continuous Infusions:  sodium chloride Stopped (06/07/21 0125)   sodium chloride     heparin 950 Units/hr (06/07/21 0839)   PRN Meds: sodium chloride, acetaminophen, ondansetron (ZOFRAN) IV, sodium chloride flush   Vital Signs    Vitals:   06/07/21 0500 06/07/21 0515 06/07/21 0600 06/07/21 0800  BP: (!) 148/79 (!) 155/84 (!) 155/77 140/64  Pulse: 63 66 63 69  Resp: 15 13 16 12   Temp:      TempSrc:      SpO2: 99%  97% 98%  Weight:      Height:        Intake/Output Summary (Last 24 hours) at 06/07/2021 0850 Last data filed at 06/07/2021 0600 Gross per 24 hour  Intake 513.98 ml  Output 1000 ml  Net -486.02 ml   Last 3 Weights 06/07/2021 06/04/2021 12/03/2020  Weight (lbs) 154 lb 8.7 oz 155 lb 160 lb  Weight (kg) 70.1 kg 70.308 kg 72.576 kg      Telemetry    Sinus rhythm, AIVR this morning - Personally Reviewed  ECG    ST segment elevation significantly improved but still present this AM. Personally Reviewed  Physical Exam   GEN: No acute distress.   Neck: No JVD Cardiac: RRR, no murmurs, rubs, or gallops. R radial cath site without hematoma or pain. Respiratory: Clear to auscultation bilaterally. GI: Soft, nontender, non-distended  MS: No edema; No deformity. Neuro:  Nonfocal  Psych: Normal affect   Labs    High Sensitivity Troponin:   Recent Labs  Lab 06/06/21 2240  06/07/21 0135  TROPONINIHS 1,712* 2,651*     Chemistry Recent Labs  Lab 06/06/21 2240 06/06/21 2247 06/07/21 0135  NA 129* 130* 126*  K 3.2* 3.3* 2.9*  CL 93* 90* 93*  CO2 26  --  21*  GLUCOSE 132* 125* 148*  BUN 13 15 14   CREATININE 0.71 0.60* 0.61  CALCIUM 9.2  --  8.6*  MG  --   --  1.6*  PROT 6.8  --   --   ALBUMIN 3.7  --   --   AST 47*  --   --   ALT 30  --   --   ALKPHOS 95  --   --   BILITOT 1.6*  --   --   GFRNONAA >60  --  >60  ANIONGAP 10  --  12    Lipids  Recent Labs  Lab 06/06/21 2238  CHOL 151  TRIG 151*  HDL 44  LDLCALC 77  CHOLHDL 3.4    Hematology Recent Labs  Lab 06/06/21 2240 06/06/21 2247 06/07/21 0135  WBC 9.5  --  13.4*  RBC 5.07  --  4.86  HGB 16.0 16.3 15.0  HCT 44.8 48.0 43.1  MCV 88.4  --  88.7  MCH 31.6  --  30.9  MCHC 35.7  --  34.8  RDW 13.0  --  12.7  PLT 303  --  290   Thyroid No results for input(s): TSH, FREET4 in the last 168 hours.  BNPNo results for input(s): BNP, PROBNP in the last 168 hours.  DDimer No results for input(s): DDIMER in the last 168 hours.   Radiology    CARDIAC CATHETERIZATION  Result Date: 06/07/2021 Conclusions: Severe single-vessel coronary artery disease with thrombotic 90% mid LAD stenosis and 99% subtotal occlusion of the distal LAD with TIMI-1 flow.  Mild, nonobstructive coronary artery disease noted in the LCx.  RCA is ectatic without significant stenosis. Normal left ventricular filling pressure (LVEDP ~7 mmHg). Successful PCI to mid and distal LAD using nonoverlapping Onyx Frontier 3.0 x 12 mm (mid LAD) and 2.0 x 15 mm (distal LAD) drug-eluting stents with 0% residual stenosis and TIMI-2 flow into the distal vessel.  Poor reflow most likely related to microvascular dysfunction from late presentation. Recommendations: Admit to 2H-ICU for post STEMI monitoring/care. Complete 2 hours of cangrelor infusion. Dual antiplatelet therapy with aspirin and ticagrelor for at least 12 months.  If atrial  fibrillation persists during the hospitalization, we may need to consider transitioning to clopidogrel plus NOAC prior to discharge. Gentle post-catheterization hydration, given normal LVEDP but likely reduced LVEF. Obtain echocardiogram. Aggressive secondary prevention of coronary artery disease. [name], MD [facility]  DG Chest Port 1 View  Result Date: 06/07/2021 CLINICAL DATA:  Chest pain. EXAM: PORTABLE CHEST 1 VIEW COMPARISON:  Chest CT 09/04/2013. FINDINGS: The lungs are mildly emphysematous. There is mild chronic subpleural reticulation in the left base. No focal pneumonia is seen, with slight elevation chronically right hemidiaphragm. There is evidence of stenting of the LAD coronary artery , normal heart size. Aortic atherosclerosis with unremarkable mediastinum. There is osteopenia and thoracic spondylosis, mild thoracic levoscoliosis. IMPRESSION: No active disease.  Stable COPD chest.  Aortic atherosclerosis. Electronically Signed   By: Telford Nab M.D.   On: 06/07/2021 06:30       Assessment & Plan    88yo man admitted with anterior STEMI now s/p PCI to the LAD.  #CAD #Anterior STEMI - cangrelor infusion completed - continue heparin - continue aspirin - continue brilenta - cont low dose BB - cont atorvastatin  ICU for 24 more hours, then out to floor tomorrow.  For questions or updates, please contact Elliott Please consult [email or web address] for contact info under        Signed, Vickie Epley, MD  06/07/2021, 8:50 AM

## 2021-06-08 ENCOUNTER — Inpatient Hospital Stay (HOSPITAL_COMMUNITY): Payer: Medicare Other

## 2021-06-08 ENCOUNTER — Encounter (HOSPITAL_COMMUNITY): Payer: Self-pay | Admitting: Internal Medicine

## 2021-06-08 ENCOUNTER — Other Ambulatory Visit (HOSPITAL_COMMUNITY): Payer: Self-pay

## 2021-06-08 ENCOUNTER — Other Ambulatory Visit (HOSPITAL_COMMUNITY): Payer: Medicare Other

## 2021-06-08 DIAGNOSIS — I2102 ST elevation (STEMI) myocardial infarction involving left anterior descending coronary artery: Secondary | ICD-10-CM

## 2021-06-08 DIAGNOSIS — E785 Hyperlipidemia, unspecified: Secondary | ICD-10-CM | POA: Diagnosis not present

## 2021-06-08 DIAGNOSIS — I493 Ventricular premature depolarization: Secondary | ICD-10-CM | POA: Diagnosis not present

## 2021-06-08 LAB — ECHOCARDIOGRAM COMPLETE
AV Mean grad: 2 mmHg
AV Peak grad: 4.1 mmHg
Ao pk vel: 1.01 m/s
Area-P 1/2: 4.63 cm2
Calc EF: 52.8 %
Height: 67 in
P 1/2 time: 463 msec
S' Lateral: 2.2 cm
Single Plane A2C EF: 56 %
Single Plane A4C EF: 49.5 %
Weight: 2472.68 oz

## 2021-06-08 LAB — GLUCOSE, CAPILLARY: Glucose-Capillary: 157 mg/dL — ABNORMAL HIGH (ref 70–99)

## 2021-06-08 LAB — BASIC METABOLIC PANEL
Anion gap: 8 (ref 5–15)
BUN: 15 mg/dL (ref 8–23)
CO2: 22 mmol/L (ref 22–32)
Calcium: 8.4 mg/dL — ABNORMAL LOW (ref 8.9–10.3)
Chloride: 101 mmol/L (ref 98–111)
Creatinine, Ser: 0.76 mg/dL (ref 0.61–1.24)
GFR, Estimated: >60 mL/min (ref >60)
Glucose, Bld: 130 mg/dL — ABNORMAL HIGH (ref 70–99)
Potassium: 3.7 mmol/L (ref 3.5–5.1)
Sodium: 131 mmol/L — ABNORMAL LOW (ref 135–145)

## 2021-06-08 LAB — MAGNESIUM: Magnesium: 2.1 mg/dL (ref 1.7–2.4)

## 2021-06-08 LAB — CBC
HCT: 36.6 % — ABNORMAL LOW (ref 39.0–52.0)
Hemoglobin: 13 g/dL (ref 13.0–17.0)
MCH: 31.9 pg (ref 26.0–34.0)
MCHC: 35.5 g/dL (ref 30.0–36.0)
MCV: 89.7 fL (ref 80.0–100.0)
Platelets: 271 10*3/uL (ref 150–400)
RBC: 4.08 MIL/uL — ABNORMAL LOW (ref 4.22–5.81)
RDW: 13.1 % (ref 11.5–15.5)
WBC: 13.2 10*3/uL — ABNORMAL HIGH (ref 4.0–10.5)
nRBC: 0 % (ref 0.0–0.2)

## 2021-06-08 LAB — HEPARIN LEVEL (UNFRACTIONATED): Heparin Unfractionated: 0.3 IU/mL (ref 0.30–0.70)

## 2021-06-08 MED ORDER — METOPROLOL TARTRATE 25 MG PO TABS
25.0000 mg | ORAL_TABLET | Freq: Two times a day (BID) | ORAL | Status: DC
  Administered 2021-06-08 – 2021-06-10 (×5): 25 mg via ORAL
  Filled 2021-06-08 (×5): qty 1

## 2021-06-08 MED ORDER — PERFLUTREN LIPID MICROSPHERE
1.0000 mL | INTRAVENOUS | Status: AC | PRN
  Administered 2021-06-08: 2 mL via INTRAVENOUS
  Filled 2021-06-08: qty 10

## 2021-06-08 NOTE — Progress Notes (Signed)
Report given to [employer].  Patients two daughters contacted and informed of transfer from CVICU to [facility] unit phone number and directions to [facility] elevators given to both daugthers.

## 2021-06-08 NOTE — Progress Notes (Signed)
Progress Note  Patient Name: Gabriel [name] Date of Encounter: 06/08/2021  Primary Cardiologist: new Primary MD: Dr. Alysia Penna  Subjective   No recurrent chest pain; up in chair  Inpatient Medications    Scheduled Meds:  aspirin EC  81 mg Oral Daily   atorvastatin  40 mg Oral Daily   Chlorhexidine Gluconate Cloth  6 each Topical Daily   gabapentin  300 mg Oral TID   metoprolol tartrate  12.5 mg Oral BID   pantoprazole  40 mg Oral Daily   sodium chloride flush  3 mL Intravenous Q12H   ticagrelor  90 mg Oral BID   Continuous Infusions:  sodium chloride Stopped (06/07/21 0125)   sodium chloride     heparin 950 Units/hr (06/08/21 0800)   PRN Meds: sodium chloride, acetaminophen, ondansetron (ZOFRAN) IV, sodium chloride flush   Vital Signs    Vitals:   06/08/21 0400 06/08/21 0500 06/08/21 0600 06/08/21 0700  BP: (!) 141/68 116/63 116/60 99/74  Pulse: 79 (!) 56 (!) 53 73  Resp: 19 20 (!) 8 18  Temp:      TempSrc:      SpO2: 95% 91% 98% 94%  Weight:      Height:        Intake/Output Summary (Last 24 hours) at 06/08/2021 0819 Last data filed at 06/08/2021 0800 Gross per 24 hour  Intake 339.95 ml  Output 1175 ml  Net -835.05 ml    I/O since admission:   Filed Weights   06/07/21 0030  Weight: 70.1 kg    Telemetry    Probably sinus with frequent PVCs - Personally Reviewed  ECG    Obtain ECG today  06/07/2021 ECG (independently read by me): Probable sinus rhythm at 61, # mm STE V3, but improved STE in other leads   06/06/2021: ECG (independently read by me):  AF at 88  3 mm ST elevation V2-4, PVC  Physical Exam    BP 99/74    Pulse 73    Temp (!) 97.3 F (36.3 C) (Axillary)    Resp 18    Ht 5\' 7"  (1.702 m)    Wt 70.1 kg    SpO2 94%    BMI 24.20 kg/m   Current BP 135/71   General: Alert, oriented, no distress.  Skin: normal turgor, no rashes, warm and dry HEENT: Normocephalic, atraumatic. Pupils equal round and reactive to light; sclera  anicteric; extraocular muscles intact;  Nose without nasal septal hypertrophy Mouth/Parynx benign;  Neck: No JVD, no carotid bruits; normal carotid upstroke Lungs: clear to ausculatation and percussion; no wheezing or rales Chest wall: without tenderness to palpitation Heart: PMI not displaced, RRR, s1 s2 normal, 1/6 systolic murmur, no diastolic murmur, no rubs, gallops, thrills, or heaves Abdomen: soft, nontender; no hepatosplenomehaly, BS+; abdominal aorta nontender and not dilated by palpation. Back: no CVA tenderness Pulses 2+R radial site stable Musculoskeletal: full range of motion, normal strength, no joint deformities Extremities: no clubbing cyanosis or edema, Homan's sign negative  Neurologic: grossly nonfocal; Cranial nerves grossly wnl Psychologic: Normal mood and affect   Labs    Chemistry Recent Labs  Lab 06/06/21 2240 06/06/21 2247 06/07/21 0135 06/08/21 0030  NA 129* 130* 126* 131*  K 3.2* 3.3* 2.9* 3.7  CL 93* 90* 93* 101  CO2 26  --  21* 22  GLUCOSE 132* 125* 148* 130*  BUN 13 15 14 15   CREATININE 0.71 0.60* 0.61 0.76  CALCIUM 9.2  --  8.6*  8.4*  PROT 6.8  --   --   --   ALBUMIN 3.7  --   --   --   AST 47*  --   --   --   ALT 30  --   --   --   ALKPHOS 95  --   --   --   BILITOT 1.6*  --   --   --   GFRNONAA >60  --  >60 >60  ANIONGAP 10  --  12 8     Hematology Recent Labs  Lab 06/06/21 2240 06/06/21 2247 06/07/21 0135 06/08/21 0030  WBC 9.5  --  13.4* 13.2*  RBC 5.07  --  4.86 4.08*  HGB 16.0 16.3 15.0 13.0  HCT 44.8 48.0 43.1 36.6*  MCV 88.4  --  88.7 89.7  MCH 31.6  --  30.9 31.9  MCHC 35.7  --  34.8 35.5  RDW 13.0  --  12.7 13.1  PLT 303  --  290 271   HS Troponins: 1712 > 2651  Cardiac EnzymesNo results for input(s): TROPONINI in the last 168 hours. No results for input(s): TROPIPOC in the last 168 hours.   BNPNo results for input(s): BNP, PROBNP in the last 168 hours.   DDimer No results for input(s): DDIMER in the last 168  hours.   Lipid Panel     Component Value Date/Time   CHOL 151 06/06/2021 2238   TRIG 151 (H) 06/06/2021 2238   HDL 44 06/06/2021 2238   CHOLHDL 3.4 06/06/2021 2238   VLDL 30 06/06/2021 2238   LDLCALC 77 06/06/2021 2238   LDLDIRECT 72.0 09/27/2017 0939     Radiology    CARDIAC CATHETERIZATION  Result Date: 06/07/2021 Conclusions: Severe single-vessel coronary artery disease with thrombotic 90% mid LAD stenosis and 99% subtotal occlusion of the distal LAD with TIMI-1 flow.  Mild, nonobstructive coronary artery disease noted in the LCx.  RCA is ectatic without significant stenosis. Normal left ventricular filling pressure (LVEDP ~7 mmHg). Successful PCI to mid and distal LAD using nonoverlapping Onyx Frontier 3.0 x 12 mm (mid LAD) and 2.0 x 15 mm (distal LAD) drug-eluting stents with 0% residual stenosis and TIMI-2 flow into the distal vessel.  Poor reflow most likely related to microvascular dysfunction from late presentation. Recommendations: Admit to 2H-ICU for post STEMI monitoring/care. Complete 2 hours of cangrelor infusion. Dual antiplatelet therapy with aspirin and ticagrelor for at least 12 months.  If atrial fibrillation persists during the hospitalization, we may need to consider transitioning to clopidogrel plus NOAC prior to discharge. Gentle post-catheterization hydration, given normal LVEDP but likely reduced LVEF. Obtain echocardiogram. Aggressive secondary prevention of coronary artery disease. [name], MD [facility]  DG Chest Port 1 View  Result Date: 06/07/2021 CLINICAL DATA:  Chest pain. EXAM: PORTABLE CHEST 1 VIEW COMPARISON:  Chest CT 09/04/2013. FINDINGS: The lungs are mildly emphysematous. There is mild chronic subpleural reticulation in the left base. No focal pneumonia is seen, with slight elevation chronically right hemidiaphragm. There is evidence of stenting of the LAD coronary artery , normal heart size. Aortic atherosclerosis with unremarkable  mediastinum. There is osteopenia and thoracic spondylosis, mild thoracic levoscoliosis. IMPRESSION: No active disease.  Stable COPD chest.  Aortic atherosclerosis. Electronically Signed   By: Telford Nab M.D.   On: 06/07/2021 06:30    Cardiac Studies   Conclusions: Severe single-vessel coronary artery disease with thrombotic 90% mid LAD stenosis and 99% subtotal occlusion of the distal LAD with  TIMI-1 flow.  Mild, nonobstructive coronary artery disease noted in the LCx.  RCA is ectatic without significant stenosis. Normal left ventricular filling pressure (LVEDP ~7 mmHg). Successful PCI to mid and distal LAD using nonoverlapping Onyx Frontier 3.0 x 12 mm (mid LAD) and 2.0 x 15 mm (distal LAD) drug-eluting stents with 0% residual stenosis and TIMI-2 flow into the distal vessel.  Poor reflow most likely related to microvascular dysfunction from late presentation.   Recommendations: Admit to 2H-ICU for post STEMI monitoring/care. Complete 2 hours of cangrelor infusion. Dual antiplatelet therapy with aspirin and ticagrelor for at least 12 months.  If atrial fibrillation persists during the hospitalization, we may need to consider transitioning to clopidogrel plus NOAC prior to discharge. Gentle post-catheterization hydration, given normal LVEDP but likely reduced LVEF. Obtain echocardiogram. Aggressive secondary prevention of coronary artery disease.   Intervention Implants     Patient Profile     Gabriel [name] is a 88 y.o. male with pmh sx for HTN who presented with Chest pain in setting of Anterior SEMI  Assessment & Plan    Day 2 status post anterior STEMI: Patient presented with 2 to 3-day history of intermittent chest pain which became more intense late Saturday evening.  Acute catheterization revealed 90 and 99% proximal and mid LAD stenosis which was successfully stented.  He denies any recurrent anginal symptomatology.  Currently he is on aspirin/Brilinta, metoprolol tartrate 12.5  mg twice a day and atorvastatin 40 mg.  He can continues to have frequent PVCs and had initial atrial fibrillation.  Upon presentation.  On monitor, today there is significant artifact.  We will obtain repeat ECG to hopefully verify sinus rhythm.  Plan 2D echo Doppler study to assess LV function. Atrial fibrillation: No prior history of atrial fibrillation.  Upon presentation initial ECG showed atrial fibrillation at 88.  Labs: ECG yesterday suggested probable sinus rhythm although P waves are diminutive.  Will obtain ECG today.  If patient is still in atrial fibrillation, may need to transition to aspirin/Plavix with initiation of anticoagulation.  However age is 20, patient walks with a walker and will need to discern risk/benefit ratio. Frequent PVCs: We will titrate metoprolol to 25 mg twice a day. Hypertension: Prior to admission the patient was on hydrochlorothiazide.  Will not resume presently. Lipid therapy: Upon presentation the patient's LDL cholesterol was 77.  He is now on atorvastatin 40 mg daily. Cardiac rehab: They have initiated ambulation.  We will plan transfer to 6 E.later today.  Signed, Troy Sine, MD, [facility] 06/08/2021, 8:19 AM

## 2021-06-08 NOTE — Progress Notes (Signed)
ANTICOAGULATION CONSULT NOTE   Pharmacy Consult for IV Heparin Indication: atrial fibrillation  Allergies  Allergen Reactions   Contrast Media [Iodinated Contrast Media] Rash    Patient Measurements: Height: 5\' 7"  (170.2 cm) Weight: 70.1 kg (154 lb 8.7 oz) IBW/kg (Calculated) : 66.1  Vital Signs: Temp: 97.3 F (36.3 C) (02/12 2000) Temp Source: Axillary (02/12 2000) BP: 116/60 (02/13 0600) Pulse Rate: 53 (02/13 0600)  Labs: Recent Labs    06/06/21 2240 06/06/21 2247 06/07/21 0135 06/07/21 1551 06/08/21 0030  HGB 16.0 16.3 15.0  --  13.0  HCT 44.8 48.0 43.1  --  36.6*  PLT 303  --  290  --  271  APTT 26  --   --   --   --   LABPROT 13.1  --   --   --   --   INR 1.0  --   --   --   --   HEPARINUNFRC  --   --   --  0.36 0.30  CREATININE 0.71 0.60* 0.61  --  0.76  TROPONINIHS 1,712*  --  2,651*  --   --      Estimated Creatinine Clearance: 52.8 mL/min (by C-G formula based on SCr of 0.76 mg/dL).   Assessment: 88 y.o. male admitted with STEMI now s/p PCI with new onset atrial fibrillation. Heparin to start 2 hours after TR band deflation. Pharmacy consulted for heparin.   Heparin drip 950 units/hr  Heparin level 0.30, therapeutic at lower end of goal HGB stable no bleeding noted   Goal of Therapy:  Heparin level 0.3-0.7 units/ml Monitor platelets by anticoagulation protocol: Yes   Plan:  Continue heparin 950 units/hr  Daily CBC and heparin level  Thank you for allowing pharmacy to participate in this patient's care.  Levonne Spiller, PharmD PGY1 Acute Care Resident  06/08/2021,6:27 AM

## 2021-06-08 NOTE — TOC Progression Note (Signed)
Transition of Care [facility]) - Progression Note    Patient Details  Name: Gabriel [name] MRN: [id number] Date of Birth: 1924/05/22  Transition of Care [facility]) CM/SW Contact  Zenon Mayo, RN Phone Number: 06/08/2021, 8:34 AM  Clinical Narrative:    Patient is for [place] living, here with STEMI, copay for eliquis is 47.0 and brilinta is 47.00 per benefit check by pharmacist. TOC will continue to follow for dc needs.         Expected Discharge Plan and Services                                                 Social Determinants of Health (SDOH) Interventions    Readmission Risk Interventions No flowsheet data found.

## 2021-06-08 NOTE — TOC Benefit Eligibility Note (Addendum)
Patient Teacher, English as a foreign language completed.    The patient is currently admitted and upon discharge could be taking Eliquis 5 mg.  The current 30 day co-pay is, $47.00.   The patient is currently admitted and upon discharge could be taking Brilinta 90 mg.  The current 30 day co-pay is, $47.00.   The patient is insured through [place], [place] Patient Advocate Specialist [place] Patient Advocate Team Direct Number: [phone]  Fax: [phone]

## 2021-06-08 NOTE — Progress Notes (Signed)
CARDIAC REHAB PHASE I   PRE:  Rate/Rhythm: 63 SR  BP:  Sitting: 137/66      SaO2: 100 RA  MODE:  Ambulation: 170 ft   POST:  Rate/Rhythm: 88 SR  BP:  Sitting: 172/99    SaO2: 95 RA   Pt agreeable to ambulation. Pt assisted to BR than ambulated 179ft in hallway assist of one with front wheel walker and gait belt. Pt denies CP, SOB, or dizziness. States he feels much better today. Pt returned to bed at request of echo tech. MI Book and other materials left at bedside with pt and son-in-law. Will refer to CRP II [place].   [phone] Rufina Falco, RN BSN 06/08/2021 10:33 AM

## 2021-06-09 ENCOUNTER — Other Ambulatory Visit (HOSPITAL_COMMUNITY): Payer: Self-pay

## 2021-06-09 DIAGNOSIS — I2102 ST elevation (STEMI) myocardial infarction involving left anterior descending coronary artery: Secondary | ICD-10-CM | POA: Diagnosis not present

## 2021-06-09 DIAGNOSIS — I10 Essential (primary) hypertension: Secondary | ICD-10-CM | POA: Diagnosis not present

## 2021-06-09 DIAGNOSIS — I493 Ventricular premature depolarization: Secondary | ICD-10-CM | POA: Diagnosis not present

## 2021-06-09 LAB — CBC
HCT: 39.1 % (ref 39.0–52.0)
Hemoglobin: 13.6 g/dL (ref 13.0–17.0)
MCH: 31.8 pg (ref 26.0–34.0)
MCHC: 34.8 g/dL (ref 30.0–36.0)
MCV: 91.4 fL (ref 80.0–100.0)
Platelets: 259 10*3/uL (ref 150–400)
RBC: 4.28 MIL/uL (ref 4.22–5.81)
RDW: 13.4 % (ref 11.5–15.5)
WBC: 12 10*3/uL — ABNORMAL HIGH (ref 4.0–10.5)
nRBC: 0 % (ref 0.0–0.2)

## 2021-06-09 LAB — BASIC METABOLIC PANEL
Anion gap: 10 (ref 5–15)
BUN: 13 mg/dL (ref 8–23)
CO2: 21 mmol/L — ABNORMAL LOW (ref 22–32)
Calcium: 8.6 mg/dL — ABNORMAL LOW (ref 8.9–10.3)
Chloride: 102 mmol/L (ref 98–111)
Creatinine, Ser: 0.7 mg/dL (ref 0.61–1.24)
GFR, Estimated: >60 mL/min (ref >60)
Glucose, Bld: 95 mg/dL (ref 70–99)
Potassium: 3.7 mmol/L (ref 3.5–5.1)
Sodium: 133 mmol/L — ABNORMAL LOW (ref 135–145)

## 2021-06-09 LAB — HEPARIN LEVEL (UNFRACTIONATED): Heparin Unfractionated: <0.1 IU/mL — ABNORMAL LOW (ref 0.30–0.70)

## 2021-06-09 NOTE — Progress Notes (Signed)
CARDIAC REHAB PHASE I   PRE:  Rate/Rhythm: 54 SR  MODE:  Ambulation: 250 ft   POST:  Rate/Rhythm: 83 SR   Pt assisted out of BR and agreeable to ambulate. Pt ambulated 273ft in hallway assist of one with front wheel walker. Pt returned to bed. RN at bedside for a wash-up. Will continue to follow.  [phone] Rufina Falco, RN BSN 06/09/2021 2:57 PM

## 2021-06-09 NOTE — Discharge Instructions (Addendum)
Information about your medication: Brilinta (anti-platelet agent)  Generic Name (Brand): ticagrelor (Brilinta), twice daily medication  PURPOSE: You are taking this medication along with aspirin to lower your chance of having a heart attack, stroke, or blood clots in your heart stent. These can be fatal. Brilinta and aspirin help prevent platelets from sticking together and forming a clot that can block an artery or your stent.   Common SIDE EFFECTS you may experience include: bruising or bleeding more easily, shortness of breath  Do not stop taking BRILINTA without talking to the doctor who prescribes it for you. People who are treated with a stent and stop taking Brilinta too soon, have a higher risk of getting a blood clot in the stent, having a heart attack, or dying. If you stop Brilinta because of bleeding, or for other reasons, your risk of a heart attack or stroke may increase.   Avoid taking NSAID agents or anti-inflammatory medications such as ibuprofen, naproxen given increased bleed risk with plavix - can use acetaminophen (Tylenol) if needed for pain.  Tell all of your doctors and dentists that you are taking Brilinta. They should talk to the doctor who prescribed Brilinta for you before you have any surgery or invasive procedure.   Contact your health care provider if you experience: severe or uncontrollable bleeding, pink/red/brown urine, vomiting blood or vomit that looks like "coffee grounds", red or black stools (looks like tar), coughing up blood or blood clots ----------------------------------------------------------------------------------------------------------------------   For heart stent: brilinta + aspirin For cholesterol: atorvastatin  For heart rate/blood pressure: metoprolol

## 2021-06-09 NOTE — Progress Notes (Signed)
Patient notified nurse that he cannot find his glasses.  Room searched, belongings bags searched and 2H called. Unable to locate glasses.  Security notified to fill out report on lost prescription glasses.

## 2021-06-09 NOTE — Progress Notes (Signed)
[place] for IV Heparin Indication: atrial fibrillation  Allergies  Allergen Reactions   Contrast Media [Iodinated Contrast Media] Rash    Patient Measurements: Height: 5\' 7"  (170.2 cm) Weight: 72.3 kg (159 lb 6.3 oz) IBW/kg (Calculated) : 66.1  Vital Signs: Temp: 97.7 F (36.5 C) (02/13 1925) Temp Source: Oral (02/13 1925) BP: 129/68 (02/13 1925) Pulse Rate: 71 (02/13 1925)  Labs: Recent Labs    06/06/21 2240 06/06/21 2247 06/07/21 0135 06/07/21 1551 06/08/21 0030 06/09/21 0316  HGB 16.0 16.3 15.0  --  13.0  --   HCT 44.8 48.0 43.1  --  36.6*  --   PLT 303  --  290  --  271  --   APTT 26  --   --   --   --   --   LABPROT 13.1  --   --   --   --   --   INR 1.0  --   --   --   --   --   HEPARINUNFRC  --   --   --  0.36 0.30 <0.10*  CREATININE 0.71 0.60* 0.61  --  0.76 0.70  TROPONINIHS 1,712*  --  2,651*  --   --   --      Estimated Creatinine Clearance: 52.8 mL/min (by C-G formula based on SCr of 0.7 mg/dL).   Assessment: 88 y.o. male admitted with STEMI now s/p PCI with new onset atrial fibrillation. Heparin to start 2 hours after TR band deflation. Pharmacy consulted for heparin.   2/14 AM update:  Heparin level low No infusion issues per RN  Goal of Therapy:  Heparin level 0.3-0.7 units/ml Monitor platelets by anticoagulation protocol: Yes   Plan:  Inc heparin to 1050 units/hr 1300 heparin level  Narda Bonds, PharmD, BCPS Clinical Pharmacist Phone: [phone]

## 2021-06-09 NOTE — Progress Notes (Addendum)
Progress Note  Patient Name: Gabriel [name] Date of Encounter: 06/09/2021  [facility] Cardiologist: None   Subjective   Feeling well this morning.   Inpatient Medications    Scheduled Meds:  aspirin EC  81 mg Oral Daily   atorvastatin  40 mg Oral Daily   gabapentin  300 mg Oral TID   metoprolol tartrate  25 mg Oral BID   pantoprazole  40 mg Oral Daily   sodium chloride flush  3 mL Intravenous Q12H   ticagrelor  90 mg Oral BID   Continuous Infusions:  sodium chloride Stopped (06/07/21 0125)   sodium chloride     PRN Meds: sodium chloride, acetaminophen, ondansetron (ZOFRAN) IV, sodium chloride flush   Vital Signs    Vitals:   06/08/21 1700 06/08/21 1925 06/09/21 0420 06/09/21 0850  BP: (!) 103/93 129/68 (!) 125/96 (!) 121/96  Pulse: 71 71 65 63  Resp: 20 16  16   Temp:  97.7 F (36.5 C) 97.7 F (36.5 C) 98.3 F (36.8 C)  TempSrc:  Oral Oral Oral  SpO2: 96% 94% 97% 100%  Weight:  72.3 kg    Height:  5\' 7"  (1.702 m)      Intake/Output Summary (Last 24 hours) at 06/09/2021 1153 Last data filed at 06/09/2021 1151 Gross per 24 hour  Intake 379.38 ml  Output 1100 ml  Net -720.62 ml   Last 3 Weights 06/08/2021 06/07/2021 06/04/2021  Weight (lbs) 159 lb 6.3 oz 154 lb 8.7 oz 155 lb  Weight (kg) 72.3 kg 70.1 kg 70.308 kg      Telemetry    SB, intermittent junctional rhythm - Personally Reviewed  ECG    Junctional Rhythm, 61 bpm  - Personally Reviewed  Physical Exam   GEN: No acute distress.   Neck: No JVD Cardiac: RRR, no murmurs, rubs, or gallops.  Respiratory: Clear to auscultation bilaterally. GI: Soft, nontender, non-distended  MS: No edema; No deformity. Neuro:  Nonfocal  Psych: Normal affect   Labs    High Sensitivity Troponin:   Recent Labs  Lab 06/06/21 2240 06/07/21 0135  TROPONINIHS 1,712* 2,651*     Chemistry Recent Labs  Lab 06/06/21 2240 06/06/21 2247 06/07/21 0135 06/08/21 0030 06/09/21 0316  NA 129*   < > 126* 131*  133*  K 3.2*   < > 2.9* 3.7 3.7  CL 93*   < > 93* 101 102  CO2 26  --  21* 22 21*  GLUCOSE 132*   < > 148* 130* 95  BUN 13   < > 14 15 13   CREATININE 0.71   < > 0.61 0.76 0.70  CALCIUM 9.2  --  8.6* 8.4* 8.6*  MG  --   --  1.6* 2.1  --   PROT 6.8  --   --   --   --   ALBUMIN 3.7  --   --   --   --   AST 47*  --   --   --   --   ALT 30  --   --   --   --   ALKPHOS 95  --   --   --   --   BILITOT 1.6*  --   --   --   --   GFRNONAA >60  --  >60 >60 >60  ANIONGAP 10  --  12 8 10    < > = values in this interval not displayed.    Lipids  Recent Labs  Lab 06/06/21 2238  CHOL 151  TRIG 151*  HDL 44  LDLCALC 77  CHOLHDL 3.4    Hematology Recent Labs  Lab 06/07/21 0135 06/08/21 0030 06/09/21 0316  WBC 13.4* 13.2* 12.0*  RBC 4.86 4.08* 4.28  HGB 15.0 13.0 13.6  HCT 43.1 36.6* 39.1  MCV 88.7 89.7 91.4  MCH 30.9 31.9 31.8  MCHC 34.8 35.5 34.8  RDW 12.7 13.1 13.4  PLT 290 271 259   Thyroid No results for input(s): TSH, FREET4 in the last 168 hours.  BNPNo results for input(s): BNP, PROBNP in the last 168 hours.  DDimer No results for input(s): DDIMER in the last 168 hours.   Radiology    ECHOCARDIOGRAM COMPLETE  Result Date: 06/08/2021    ECHOCARDIOGRAM REPORT   Patient Name:   JAKUB DEBOLD Date of Exam: 06/08/2021 Medical Rec #:  [id number]       Height:       67.0 in Accession #:    [id number]      Weight:       154.5 lb Date of Birth:  Sep 22, 1924       BSA:          1.812 m Patient Age:    31 years        BP:           137/66 mmHg Patient Gender: M               HR:           62 bpm. Exam Location:  Inpatient Procedure: 2D Echo, Cardiac Doppler, Color Doppler and Intracardiac            Opacification Agent Indications:    MI  History:        Patient has no prior history of Echocardiogram examinations.                 Risk Factors:Hypertension. 06/06/2021 cath.  Sonographer:    Luisa Hart RDCS Referring Phys: [place]  1. Left ventricular ejection  fraction, by estimation, is 40 to 45%. The left ventricle has mildly decreased function. The left ventricle demonstrates regional wall motion abnormalities (see scoring diagram/findings for description). There is moderate asymmetric left ventricular hypertrophy of the basal-septal segment. Left ventricular diastolic parameters are consistent with Grade II diastolic dysfunction (pseudonormalization). Elevated left atrial pressure.  2. Right ventricular systolic function is normal. The right ventricular size is normal. There is moderately elevated pulmonary artery systolic pressure.  3. Left atrial size was mildly dilated.  4. The mitral valve is normal in structure. Trivial mitral valve regurgitation. No evidence of mitral stenosis.  5. The aortic valve is tricuspid. Aortic valve regurgitation is trivial. Aortic valve sclerosis is present, with no evidence of aortic valve stenosis. FINDINGS  Left Ventricle: Left ventricular ejection fraction, by estimation, is 40 to 45%. The left ventricle has mildly decreased function. The left ventricle demonstrates regional wall motion abnormalities. Definity contrast agent was given IV to delineate the left ventricular endocardial borders. The left ventricular internal cavity size was normal in size. There is moderate asymmetric left ventricular hypertrophy of the basal-septal segment. Left ventricular diastolic parameters are consistent with Grade II diastolic dysfunction (pseudonormalization). Elevated left atrial pressure.  LV Wall Scoring: The mid and distal anterior septum, apical anterior segment, apical inferior segment, and apex are akinetic. The mid anterior segment is hypokinetic. The entire lateral wall, inferior wall, basal anteroseptal segment, mid inferoseptal segment, basal  anterior segment, and basal inferoseptal segment are normal. Right Ventricle: The right ventricular size is normal. No increase in right ventricular wall thickness. Right ventricular systolic  function is normal. There is moderately elevated pulmonary artery systolic pressure. The tricuspid regurgitant velocity is 3.16 m/s, and with an assumed right atrial pressure of 8 mmHg, the estimated right ventricular systolic pressure is 51.7 mmHg. Left Atrium: Left atrial size was mildly dilated. Right Atrium: Right atrial size was normal in size. Pericardium: There is no evidence of pericardial effusion. Presence of epicardial fat layer. Mitral Valve: The mitral valve is normal in structure. Trivial mitral valve regurgitation. No evidence of mitral valve stenosis. MV peak gradient, 6.0 mmHg. The mean mitral valve gradient is 2.0 mmHg. Tricuspid Valve: The tricuspid valve is normal in structure. Tricuspid valve regurgitation is mild. Aortic Valve: The aortic valve is tricuspid. Aortic valve regurgitation is trivial. Aortic regurgitation PHT measures 463 msec. Aortic valve sclerosis is present, with no evidence of aortic valve stenosis. Aortic valve mean gradient measures 2.0 mmHg. Aortic valve peak gradient measures 4.1 mmHg. Pulmonic Valve: The pulmonic valve was not well visualized. Pulmonic valve regurgitation is trivial. Aorta: The aortic root and ascending aorta are structurally normal, with no evidence of dilitation. IAS/Shunts: No atrial level shunt detected by color flow Doppler.  LEFT VENTRICLE PLAX 2D LVIDd:         3.80 cm     Diastology LVIDs:         2.20 cm     LV e' medial:    4.35 cm/s LV PW:         1.10 cm     LV E/e' medial:  23.0 LV IVS:        1.40 cm     LV e' lateral:   8.16 cm/s                            LV E/e' lateral: 12.2  LV Volumes (MOD) LV vol d, MOD A2C: 60.0 ml LV vol d, MOD A4C: 63.0 ml LV vol s, MOD A2C: 26.4 ml LV vol s, MOD A4C: 31.8 ml LV SV MOD A2C:     33.6 ml LV SV MOD A4C:     63.0 ml LV SV MOD BP:      32.5 ml RIGHT VENTRICLE RV Basal diam:  3.50 cm RV Mid diam:    2.20 cm RV S prime:     9.79 cm/s TAPSE (M-mode): 1.7 cm LEFT ATRIUM             Index LA Vol (A2C):   54.0  ml 29.80 ml/m LA Vol (A4C):   79.8 ml 44.03 ml/m LA Biplane Vol: 67.8 ml 37.41 ml/m  AORTIC VALVE                   PULMONIC VALVE AV Vmax:           101.10 cm/s PV Vmax:       0.99 m/s AV Vmean:          65.500 cm/s PV Vmean:      66.200 cm/s AV VTI:            0.216 m     PV VTI:        0.217 m AV Peak Grad:      4.1 mmHg    PV Peak grad:  3.9 mmHg AV Mean Grad:  2.0 mmHg    PV Mean grad:  2.0 mmHg LVOT Vmax:         69.80 cm/s LVOT Vmean:        44.100 cm/s LVOT VTI:          0.152 m LVOT/AV VTI ratio: 0.71 AI PHT:            463 msec  AORTA Ao Asc diam: 3.30 cm MITRAL VALVE               TRICUSPID VALVE MV Area (PHT): 4.63 cm    TR Peak grad:   39.9 mmHg MV Peak grad:  6.0 mmHg    TR Vmax:        316.00 cm/s MV Mean grad:  2.0 mmHg MV Vmax:       1.22 m/s    SHUNTS MV Vmean:      64.0 cm/s   Systemic VTI: 0.15 m MV Decel Time: 164 msec MV E velocity: 99.90 cm/s MV A velocity: 59.70 cm/s MV E/A ratio:  1.67 Oswaldo Milian MD Electronically signed by Oswaldo Milian MD Signature Date/Time: 06/08/2021/2:03:28 PM    Final     Cardiac Studies   Cath: 06/06/21  Conclusions: Severe single-vessel coronary artery disease with thrombotic 90% mid LAD stenosis and 99% subtotal occlusion of the distal LAD with TIMI-1 flow.  Mild, nonobstructive coronary artery disease noted in the LCx.  RCA is ectatic without significant stenosis. Normal left ventricular filling pressure (LVEDP ~7 mmHg). Successful PCI to mid and distal LAD using nonoverlapping Onyx Frontier 3.0 x 12 mm (mid LAD) and 2.0 x 15 mm (distal LAD) drug-eluting stents with 0% residual stenosis and TIMI-2 flow into the distal vessel.  Poor reflow most likely related to microvascular dysfunction from late presentation.   Recommendations: Admit to 2H-ICU for post STEMI monitoring/care. Complete 2 hours of cangrelor infusion. Dual antiplatelet therapy with aspirin and ticagrelor for at least 12 months.  If atrial fibrillation persists  during the hospitalization, we may need to consider transitioning to clopidogrel plus NOAC prior to discharge. Gentle post-catheterization hydration, given normal LVEDP but likely reduced LVEF. Obtain echocardiogram. Aggressive secondary prevention of coronary artery disease.   Intervention   Echo: 06/08/21  IMPRESSIONS     1. Left ventricular ejection fraction, by estimation, is 40 to 45%. The  left ventricle has mildly decreased function. The left ventricle  demonstrates regional wall motion abnormalities (see scoring  diagram/findings for description). There is moderate  asymmetric left ventricular hypertrophy of the basal-septal segment. Left  ventricular diastolic parameters are consistent with Grade II diastolic  dysfunction (pseudonormalization). Elevated left atrial pressure.   2. Right ventricular systolic function is normal. The right ventricular  size is normal. There is moderately elevated pulmonary artery systolic  pressure.   3. Left atrial size was mildly dilated.   4. The mitral valve is normal in structure. Trivial mitral valve  regurgitation. No evidence of mitral stenosis.   5. The aortic valve is tricuspid. Aortic valve regurgitation is trivial.  Aortic valve sclerosis is present, with no evidence of aortic valve  stenosis.   FINDINGS   Left Ventricle: Left ventricular ejection fraction, by estimation, is 40  to 45%. The left ventricle has mildly decreased function. The left  ventricle demonstrates regional wall motion abnormalities. Definity  contrast agent was given IV to delineate the  left ventricular endocardial borders. The left ventricular internal cavity  size was normal in size. There is moderate asymmetric left ventricular  hypertrophy of the basal-septal segment. Left ventricular diastolic  parameters are consistent with Grade II  diastolic dysfunction (pseudonormalization). Elevated left atrial  pressure.      LV Wall Scoring:  The mid and  distal anterior septum, apical anterior segment, apical  inferior  segment, and apex are akinetic. The mid anterior segment is hypokinetic.  The  entire lateral wall, inferior wall, basal anteroseptal segment, mid  inferoseptal segment, basal anterior segment, and basal inferoseptal  segment  are normal.   Right Ventricle: The right ventricular size is normal. No increase in  right ventricular wall thickness. Right ventricular systolic function is  normal. There is moderately elevated pulmonary artery systolic pressure.  The tricuspid regurgitant velocity is  3.16 m/s, and with an assumed right atrial pressure of 8 mmHg, the  estimated right ventricular systolic pressure is 27.0 mmHg.   Left Atrium: Left atrial size was mildly dilated.   Right Atrium: Right atrial size was normal in size.   Pericardium: There is no evidence of pericardial effusion. Presence of  epicardial fat layer.   Mitral Valve: The mitral valve is normal in structure. Trivial mitral  valve regurgitation. No evidence of mitral valve stenosis. MV peak  gradient, 6.0 mmHg. The mean mitral valve gradient is 2.0 mmHg.   Tricuspid Valve: The tricuspid valve is normal in structure. Tricuspid  valve regurgitation is mild.   Aortic Valve: The aortic valve is tricuspid. Aortic valve regurgitation is  trivial. Aortic regurgitation PHT measures 463 msec. Aortic valve  sclerosis is present, with no evidence of aortic valve stenosis. Aortic  valve mean gradient measures 2.0 mmHg.  Aortic valve peak gradient measures 4.1 mmHg.   Pulmonic Valve: The pulmonic valve was not well visualized. Pulmonic valve  regurgitation is trivial.   Aorta: The aortic root and ascending aorta are structurally normal, with  no evidence of dilitation.   IAS/Shunts: No atrial level shunt detected by color flow Doppler.   Patient Profile     88 y.o. male with pmh sx for HTN who presented with Chest pain in setting of Anterior  SEMI  Assessment & Plan    STEMI: Presented with 2 to 3-day history of intermittent chest pain.  Underwent cardiac catheterization which revealed 99% proximal and 90% mid LAD stenosis which was treated with PCI/DES x2.  Placed on DAPT with aspirin/Brilinta for at least 1 year.  Has been seen by cardiac rehab and ambulated without recurrent symptoms.  Paroxysmal atrial fibrillation: History of the same.  Initial EKG on presentation concerning for new onset of A-fib.  Repeat EKGs appear more consistent with junctional rhythm.  He has been on IV heparin.  We will plan to DC this today as it does not appear he has had further episodes of A-fib. --With his advanced age and need for DAPT would not be ideal candidate for triple therapy.  HFrEF/ICM: Echo showed LVEF of 40 to 45%, apical anterior walls with hypokinesis. --On metoprolol 25 mg twice daily  Hyperlipidemia: LDL 77 --On Lipitor 40 mg daily  Hypertension: Previously on hydrochlorothiazide, held --Continue metoprolol 25 mg twice daily  Junctional rhythm: intermittent episodes of junctional rhythm, will review with MD regarding BB with these episodes.   For questions or updates, please contact [place] Please consult [email or web address] for contact info under        Signed, Reino Bellis, NP  06/09/2021, 11:53 AM     Patient seen and examined. Agree with assessment and plan.  No recurrent chest pain or  shortness of breath, status post PCI to LAD in the setting of ACS.  Telemetry demonstrates sinus rhythm; P waves are small but present.  Recurrent PVCs from yesterday are significantly reduced with the slight increase of metoprolol to tartrate to 25 mg twice a day.  We will discontinue heparin.  Try to ambulate today.  Monitor his heart rate and tentatively plan for discharge tomorrow.  If heart rate becomes too slow will need to perhaps decrease metoprolol to 25 in the morning and 12.5 at night discharge.  Plan DAPT for 12  months.   Troy Sine, MD, [facility] 06/09/2021 12:20 PM

## 2021-06-09 NOTE — Progress Notes (Signed)
CARDIAC REHAB PHASE I   Offered to walk with pt. Pt declining at this time, prefers to walk later after lunch. Pt educated on importance of ASA and Brilinta. Reviewed site care, and restrictions. Encouraged continued ambulation with emphasis on safety. Referred to [facility] [place].  [id number] Rufina Falco, RN BSN 06/09/2021 11:45 AM

## 2021-06-10 ENCOUNTER — Other Ambulatory Visit (HOSPITAL_COMMUNITY): Payer: Self-pay

## 2021-06-10 ENCOUNTER — Telehealth (HOSPITAL_COMMUNITY): Payer: Self-pay

## 2021-06-10 ENCOUNTER — Telehealth: Payer: Self-pay

## 2021-06-10 DIAGNOSIS — I493 Ventricular premature depolarization: Secondary | ICD-10-CM | POA: Diagnosis not present

## 2021-06-10 DIAGNOSIS — I2102 ST elevation (STEMI) myocardial infarction involving left anterior descending coronary artery: Secondary | ICD-10-CM | POA: Diagnosis not present

## 2021-06-10 DIAGNOSIS — I10 Essential (primary) hypertension: Secondary | ICD-10-CM | POA: Diagnosis not present

## 2021-06-10 DIAGNOSIS — I480 Paroxysmal atrial fibrillation: Secondary | ICD-10-CM

## 2021-06-10 DIAGNOSIS — E785 Hyperlipidemia, unspecified: Secondary | ICD-10-CM

## 2021-06-10 LAB — CBC
HCT: 37 % — ABNORMAL LOW (ref 39.0–52.0)
Hemoglobin: 13.4 g/dL (ref 13.0–17.0)
MCH: 32.3 pg (ref 26.0–34.0)
MCHC: 36.2 g/dL — ABNORMAL HIGH (ref 30.0–36.0)
MCV: 89.2 fL (ref 80.0–100.0)
Platelets: 270 10*3/uL (ref 150–400)
RBC: 4.15 MIL/uL — ABNORMAL LOW (ref 4.22–5.81)
RDW: 13.2 % (ref 11.5–15.5)
WBC: 10.5 10*3/uL (ref 4.0–10.5)
nRBC: 0 % (ref 0.0–0.2)

## 2021-06-10 LAB — BASIC METABOLIC PANEL
Anion gap: 9 (ref 5–15)
BUN: 13 mg/dL (ref 8–23)
CO2: 21 mmol/L — ABNORMAL LOW (ref 22–32)
Calcium: 8.6 mg/dL — ABNORMAL LOW (ref 8.9–10.3)
Chloride: 102 mmol/L (ref 98–111)
Creatinine, Ser: 0.7 mg/dL (ref 0.61–1.24)
GFR, Estimated: >60 mL/min (ref >60)
Glucose, Bld: 98 mg/dL (ref 70–99)
Potassium: 3.8 mmol/L (ref 3.5–5.1)
Sodium: 132 mmol/L — ABNORMAL LOW (ref 135–145)

## 2021-06-10 MED ORDER — ATORVASTATIN CALCIUM 40 MG PO TABS
40.0000 mg | ORAL_TABLET | Freq: Every day | ORAL | 1 refills | Status: DC
  Filled 2021-06-10: qty 90, 90d supply, fill #0

## 2021-06-10 MED ORDER — METOPROLOL TARTRATE 25 MG PO TABS
25.0000 mg | ORAL_TABLET | Freq: Two times a day (BID) | ORAL | 0 refills | Status: DC
  Filled 2021-06-10: qty 180, 90d supply, fill #0

## 2021-06-10 MED ORDER — TICAGRELOR 90 MG PO TABS
90.0000 mg | ORAL_TABLET | Freq: Two times a day (BID) | ORAL | 2 refills | Status: DC
Start: 2021-06-10 — End: 2021-06-23
  Filled 2021-06-10: qty 60, 30d supply, fill #0

## 2021-06-10 MED ORDER — NITROGLYCERIN 0.4 MG SL SUBL
0.4000 mg | SUBLINGUAL_TABLET | SUBLINGUAL | 3 refills | Status: DC | PRN
  Filled 2021-06-10: qty 25, 7d supply, fill #0

## 2021-06-10 NOTE — Telephone Encounter (Signed)
Patient discharged today- TOC attempt needed on 02/16

## 2021-06-10 NOTE — Discharge Summary (Signed)
Discharge Summary    Patient ID: Gabriel [name] MRN: [id number]; DOB: 1924-08-09  Admit date: 06/06/2021 Discharge date: 06/10/2021  PCP:  Laurey Morale, MD   [facility] Providers Cardiologist:  Shelva Majestic, MD      Discharge Diagnoses    Principal Problem:   STEMI (ST elevation myocardial infarction) [facility]) Active Problems:   Essential hypertension   STEMI involving left anterior descending coronary artery ([place])   Hyperlipidemia   Paroxysmal atrial fibrillation [facility])    Diagnostic Studies/Procedures    Cath: 06/06/21   Conclusions: Severe single-vessel coronary artery disease with thrombotic 90% mid LAD stenosis and 99% subtotal occlusion of the distal LAD with TIMI-1 flow.  Mild, nonobstructive coronary artery disease noted in the LCx.  RCA is ectatic without significant stenosis. Normal left ventricular filling pressure (LVEDP ~7 mmHg). Successful PCI to mid and distal LAD using nonoverlapping Onyx Frontier 3.0 x 12 mm (mid LAD) and 2.0 x 15 mm (distal LAD) drug-eluting stents with 0% residual stenosis and TIMI-2 flow into the distal vessel.  Poor reflow most likely related to microvascular dysfunction from late presentation.   Recommendations: Admit to 2H-ICU for post STEMI monitoring/care. Complete 2 hours of cangrelor infusion. Dual antiplatelet therapy with aspirin and ticagrelor for at least 12 months.  If atrial fibrillation persists during the hospitalization, we may need to consider transitioning to clopidogrel plus NOAC prior to discharge. Gentle post-catheterization hydration, given normal LVEDP but likely reduced LVEF. Obtain echocardiogram. Aggressive secondary prevention of coronary artery disease.   Intervention   Echo: 06/08/21   IMPRESSIONS     1. Left ventricular ejection fraction, by estimation, is 40 to 45%. The  left ventricle has mildly decreased function. The left ventricle  demonstrates regional wall motion abnormalities (see scoring   diagram/findings for description). There is moderate  asymmetric left ventricular hypertrophy of the basal-septal segment. Left  ventricular diastolic parameters are consistent with Grade II diastolic  dysfunction (pseudonormalization). Elevated left atrial pressure.   2. Right ventricular systolic function is normal. The right ventricular  size is normal. There is moderately elevated pulmonary artery systolic  pressure.   3. Left atrial size was mildly dilated.   4. The mitral valve is normal in structure. Trivial mitral valve  regurgitation. No evidence of mitral stenosis.   5. The aortic valve is tricuspid. Aortic valve regurgitation is trivial.  Aortic valve sclerosis is present, with no evidence of aortic valve  stenosis.   FINDINGS   Left Ventricle: Left ventricular ejection fraction, by estimation, is 40  to 45%. The left ventricle has mildly decreased function. The left  ventricle demonstrates regional wall motion abnormalities. Definity  contrast agent was given IV to delineate the  left ventricular endocardial borders. The left ventricular internal cavity  size was normal in size. There is moderate asymmetric left ventricular  hypertrophy of the basal-septal segment. Left ventricular diastolic  parameters are consistent with Grade II  diastolic dysfunction (pseudonormalization). Elevated left atrial  pressure.      LV Wall Scoring:  The mid and distal anterior septum, apical anterior segment, apical  inferior  segment, and apex are akinetic. The mid anterior segment is hypokinetic.  The  entire lateral wall, inferior wall, basal anteroseptal segment, mid  inferoseptal segment, basal anterior segment, and basal inferoseptal  segment  are normal.   Right Ventricle: The right ventricular size is normal. No increase in  right ventricular wall thickness. Right ventricular systolic function is  normal. There is moderately elevated pulmonary  artery systolic pressure.  The  tricuspid regurgitant velocity is  3.16 m/s, and with an assumed right atrial pressure of 8 mmHg, the  estimated right ventricular systolic pressure is 97.3 mmHg.   Left Atrium: Left atrial size was mildly dilated.   Right Atrium: Right atrial size was normal in size.   Pericardium: There is no evidence of pericardial effusion. Presence of  epicardial fat layer.   Mitral Valve: The mitral valve is normal in structure. Trivial mitral  valve regurgitation. No evidence of mitral valve stenosis. MV peak  gradient, 6.0 mmHg. The mean mitral valve gradient is 2.0 mmHg.   Tricuspid Valve: The tricuspid valve is normal in structure. Tricuspid  valve regurgitation is mild.   Aortic Valve: The aortic valve is tricuspid. Aortic valve regurgitation is  trivial. Aortic regurgitation PHT measures 463 msec. Aortic valve  sclerosis is present, with no evidence of aortic valve stenosis. Aortic  valve mean gradient measures 2.0 mmHg.  Aortic valve peak gradient measures 4.1 mmHg.   Pulmonic Valve: The pulmonic valve was not well visualized. Pulmonic valve  regurgitation is trivial.   Aorta: The aortic root and ascending aorta are structurally normal, with  no evidence of dilitation.   IAS/Shunts: No atrial level shunt detected by color flow Doppler.  _____________   History of Present Illness     Gabriel [name] is a 88 y.o. male with PMH of HTN who presented with chest pain and anterior STEMI. He has no prior cardiac history except HTN for which he took HCTZ. Only other medications he took was a PPI for GERD. He reported he had been having on and off chest pain since past 1 week but he did not pay much attention. However, the pain really worsened the day of admission and was sustained hence had to call EMS. EMS indicated that they gave him full dose aspirin and the EKG showed STEMI hence code STEMI was activated. When he arrived, he was hypertensive to 180s/110s, saturating well on RA. EKG  confirmed STEMI. He stated he was allergic to dye and was given 25 mg bendryl and 125 mg solumedrol in the ED. 4000 heparin was also given, and he was taken to the cath lab.   Hospital Course     STEMI: Presented with 2 to 3-day history of intermittent chest pain.  Underwent cardiac catheterization which revealed 99% proximal and 90% mid LAD stenosis which was treated with PCI/DES x2.  Placed on DAPT with aspirin/Brilinta for at least 1 year.  Has been seen by cardiac rehab and ambulated without recurrent symptoms.   Paroxysmal atrial fibrillation: No history of the same.  Initial EKG on presentation concerning for new onset of A-fib.  Repeat EKGs appear more consistent with sinus bradycardia with occasional junctional beats. He was treated with IV heparin but stopped given no further episodes of Afib. --With his advanced age and need for DAPT would not be ideal candidate for triple therapy.   HFrEF/ICM: Echo showed LVEF of 40 to 45%, apical anterior walls with hypokinesis. --On metoprolol 25 mg twice daily   Hyperlipidemia: LDL 77 --On Lipitor 40 mg daily   Hypertension: Previously on hydrochlorothiazide, held on admission.  --Continue metoprolol 25 mg twice daily  General: Well developed, well nourished, male appearing in no acute distress. Head: Normocephalic, atraumatic.  Neck: Supple without bruits, JVD. Lungs:  Resp regular and unlabored, CTA. Heart: RRR, S1, S2, no S3, S4, or murmur; no rub. Abdomen: Soft, non-tender, non-distended with normoactive bowel  sounds. No hepatomegaly. No rebound/guarding. No obvious abdominal masses. Extremities: No clubbing, cyanosis, edema. Distal pedal pulses are 2+ bilaterally. Right radial cath site stable without bruising or hematoma Neuro: Alert and oriented X 3. Moves all extremities spontaneously. Psych: Normal affect.   Patient was seen by Dr. Claiborne Billings and deemed stable for discharge home. Follow up has been arranged in the office. Medications  sent to [facility]. Educated by PharmD prior to discharge.      Did the patient have an acute coronary syndrome (MI, NSTEMI, STEMI, etc) this admission?:  Yes                               AHA/ACC Clinical Performance & Quality Measures: Aspirin prescribed? - Yes ADP Receptor Inhibitor (Plavix/Clopidogrel, Brilinta/Ticagrelor or Effient/Prasugrel) prescribed (includes medically managed patients)? - Yes Beta Blocker prescribed? - Yes High Intensity Statin (Lipitor 40-80mg  or Crestor 20-40mg ) prescribed? - Yes EF assessed during THIS hospitalization? - Yes For EF <40%, was ACEI/ARB prescribed? - Not Applicable (EF >/= 22%) For EF <40%, Aldosterone Antagonist (Spironolactone or Eplerenone) prescribed? - Not Applicable (EF >/= 29%) Cardiac Rehab Phase II ordered (including medically managed patients)? - Yes       The patient will be scheduled for a TOC follow up appointment in 10-14 days.  A message has been sent to the [facility] and Scheduling Pool at the office where the patient should be seen for follow up.  _____________  Discharge Vitals Blood pressure 131/63, pulse 66, temperature 98 F (36.7 C), temperature source Oral, resp. rate 16, height 5\' 7"  (1.702 m), weight 72.3 kg, SpO2 100 %.  Filed Weights   06/07/21 0030 06/08/21 1925  Weight: 70.1 kg 72.3 kg    Labs & Radiologic Studies    CBC Recent Labs    06/09/21 0316 06/10/21 0313  WBC 12.0* 10.5  HGB 13.6 13.4  HCT 39.1 37.0*  MCV 91.4 89.2  PLT 259 798   Basic Metabolic Panel Recent Labs    06/08/21 0030 06/09/21 0316 06/10/21 0313  NA 131* 133* 132*  K 3.7 3.7 3.8  CL 101 102 102  CO2 22 21* 21*  GLUCOSE 130* 95 98  BUN 15 13 13   CREATININE 0.76 0.70 0.70  CALCIUM 8.4* 8.6* 8.6*  MG 2.1  --   --    Liver Function Tests No results for input(s): AST, ALT, ALKPHOS, BILITOT, PROT, ALBUMIN in the last 72 hours. No results for input(s): LIPASE, AMYLASE in the last 72 hours. High Sensitivity Troponin:    Recent Labs  Lab 06/06/21 2240 06/07/21 0135  TROPONINIHS 1,712* 2,651*    BNP Invalid input(s): POCBNP D-Dimer No results for input(s): DDIMER in the last 72 hours. Hemoglobin A1C No results for input(s): HGBA1C in the last 72 hours. Fasting Lipid Panel No results for input(s): CHOL, HDL, LDLCALC, TRIG, CHOLHDL, LDLDIRECT in the last 72 hours. Thyroid Function Tests No results for input(s): TSH, T4TOTAL, T3FREE, THYROIDAB in the last 72 hours.  Invalid input(s): FREET3 _____________  CARDIAC CATHETERIZATION  Result Date: 06/07/2021 Conclusions: Severe single-vessel coronary artery disease with thrombotic 90% mid LAD stenosis and 99% subtotal occlusion of the distal LAD with TIMI-1 flow.  Mild, nonobstructive coronary artery disease noted in the LCx.  RCA is ectatic without significant stenosis. Normal left ventricular filling pressure (LVEDP ~7 mmHg). Successful PCI to mid and distal LAD using nonoverlapping Onyx Frontier 3.0 x 12 mm (mid LAD) and  2.0 x 15 mm (distal LAD) drug-eluting stents with 0% residual stenosis and TIMI-2 flow into the distal vessel.  Poor reflow most likely related to microvascular dysfunction from late presentation. Recommendations: Admit to 2H-ICU for post STEMI monitoring/care. Complete 2 hours of cangrelor infusion. Dual antiplatelet therapy with aspirin and ticagrelor for at least 12 months.  If atrial fibrillation persists during the hospitalization, we may need to consider transitioning to clopidogrel plus NOAC prior to discharge. Gentle post-catheterization hydration, given normal LVEDP but likely reduced LVEF. Obtain echocardiogram. Aggressive secondary prevention of coronary artery disease. [name], MD [facility]  DG Chest Port 1 View  Result Date: 06/07/2021 CLINICAL DATA:  Chest pain. EXAM: PORTABLE CHEST 1 VIEW COMPARISON:  Chest CT 09/04/2013. FINDINGS: The lungs are mildly emphysematous. There is mild chronic subpleural reticulation in  the left base. No focal pneumonia is seen, with slight elevation chronically right hemidiaphragm. There is evidence of stenting of the LAD coronary artery , normal heart size. Aortic atherosclerosis with unremarkable mediastinum. There is osteopenia and thoracic spondylosis, mild thoracic levoscoliosis. IMPRESSION: No active disease.  Stable COPD chest.  Aortic atherosclerosis. Electronically Signed   By: Telford Nab M.D.   On: 06/07/2021 06:30   ECHOCARDIOGRAM COMPLETE  Result Date: 06/08/2021    ECHOCARDIOGRAM REPORT   Patient Name:   Gabriel [name] Date of Exam: 06/08/2021 Medical Rec #:  [id number]       Height:       67.0 in Accession #:    [id number]      Weight:       154.5 lb Date of Birth:  09-13-1924       BSA:          1.812 m Patient Age:    88 years        BP:           137/66 mmHg Patient Gender: M               HR:           62 bpm. Exam Location:  Inpatient Procedure: 2D Echo, Cardiac Doppler, Color Doppler and Intracardiac            Opacification Agent Indications:    MI  History:        Patient has no prior history of Echocardiogram examinations.                 Risk Factors:Hypertension. 06/06/2021 cath.  Sonographer:    Luisa Hart RDCS Referring Phys: [place]  1. Left ventricular ejection fraction, by estimation, is 40 to 45%. The left ventricle has mildly decreased function. The left ventricle demonstrates regional wall motion abnormalities (see scoring diagram/findings for description). There is moderate asymmetric left ventricular hypertrophy of the basal-septal segment. Left ventricular diastolic parameters are consistent with Grade II diastolic dysfunction (pseudonormalization). Elevated left atrial pressure.  2. Right ventricular systolic function is normal. The right ventricular size is normal. There is moderately elevated pulmonary artery systolic pressure.  3. Left atrial size was mildly dilated.  4. The mitral valve is normal in structure. Trivial mitral  valve regurgitation. No evidence of mitral stenosis.  5. The aortic valve is tricuspid. Aortic valve regurgitation is trivial. Aortic valve sclerosis is present, with no evidence of aortic valve stenosis. FINDINGS  Left Ventricle: Left ventricular ejection fraction, by estimation, is 40 to 45%. The left ventricle has mildly decreased function. The left ventricle demonstrates regional wall motion abnormalities. Definity contrast agent  was given IV to delineate the left ventricular endocardial borders. The left ventricular internal cavity size was normal in size. There is moderate asymmetric left ventricular hypertrophy of the basal-septal segment. Left ventricular diastolic parameters are consistent with Grade II diastolic dysfunction (pseudonormalization). Elevated left atrial pressure.  LV Wall Scoring: The mid and distal anterior septum, apical anterior segment, apical inferior segment, and apex are akinetic. The mid anterior segment is hypokinetic. The entire lateral wall, inferior wall, basal anteroseptal segment, mid inferoseptal segment, basal anterior segment, and basal inferoseptal segment are normal. Right Ventricle: The right ventricular size is normal. No increase in right ventricular wall thickness. Right ventricular systolic function is normal. There is moderately elevated pulmonary artery systolic pressure. The tricuspid regurgitant velocity is 3.16 m/s, and with an assumed right atrial pressure of 8 mmHg, the estimated right ventricular systolic pressure is 78.9 mmHg. Left Atrium: Left atrial size was mildly dilated. Right Atrium: Right atrial size was normal in size. Pericardium: There is no evidence of pericardial effusion. Presence of epicardial fat layer. Mitral Valve: The mitral valve is normal in structure. Trivial mitral valve regurgitation. No evidence of mitral valve stenosis. MV peak gradient, 6.0 mmHg. The mean mitral valve gradient is 2.0 mmHg. Tricuspid Valve: The tricuspid valve is  normal in structure. Tricuspid valve regurgitation is mild. Aortic Valve: The aortic valve is tricuspid. Aortic valve regurgitation is trivial. Aortic regurgitation PHT measures 463 msec. Aortic valve sclerosis is present, with no evidence of aortic valve stenosis. Aortic valve mean gradient measures 2.0 mmHg. Aortic valve peak gradient measures 4.1 mmHg. Pulmonic Valve: The pulmonic valve was not well visualized. Pulmonic valve regurgitation is trivial. Aorta: The aortic root and ascending aorta are structurally normal, with no evidence of dilitation. IAS/Shunts: No atrial level shunt detected by color flow Doppler.  LEFT VENTRICLE PLAX 2D LVIDd:         3.80 cm     Diastology LVIDs:         2.20 cm     LV e' medial:    4.35 cm/s LV PW:         1.10 cm     LV E/e' medial:  23.0 LV IVS:        1.40 cm     LV e' lateral:   8.16 cm/s                            LV E/e' lateral: 12.2  LV Volumes (MOD) LV vol d, MOD A2C: 60.0 ml LV vol d, MOD A4C: 63.0 ml LV vol s, MOD A2C: 26.4 ml LV vol s, MOD A4C: 31.8 ml LV SV MOD A2C:     33.6 ml LV SV MOD A4C:     63.0 ml LV SV MOD BP:      32.5 ml RIGHT VENTRICLE RV Basal diam:  3.50 cm RV Mid diam:    2.20 cm RV S prime:     9.79 cm/s TAPSE (M-mode): 1.7 cm LEFT ATRIUM             Index LA Vol (A2C):   54.0 ml 29.80 ml/m LA Vol (A4C):   79.8 ml 44.03 ml/m LA Biplane Vol: 67.8 ml 37.41 ml/m  AORTIC VALVE                   PULMONIC VALVE AV Vmax:           101.10 cm/s PV Vmax:  0.99 m/s AV Vmean:          65.500 cm/s PV Vmean:      66.200 cm/s AV VTI:            0.216 m     PV VTI:        0.217 m AV Peak Grad:      4.1 mmHg    PV Peak grad:  3.9 mmHg AV Mean Grad:      2.0 mmHg    PV Mean grad:  2.0 mmHg LVOT Vmax:         69.80 cm/s LVOT Vmean:        44.100 cm/s LVOT VTI:          0.152 m LVOT/AV VTI ratio: 0.71 AI PHT:            463 msec  AORTA Ao Asc diam: 3.30 cm MITRAL VALVE               TRICUSPID VALVE MV Area (PHT): 4.63 cm    TR Peak grad:   39.9 mmHg MV Peak  grad:  6.0 mmHg    TR Vmax:        316.00 cm/s MV Mean grad:  2.0 mmHg MV Vmax:       1.22 m/s    SHUNTS MV Vmean:      64.0 cm/s   Systemic VTI: 0.15 m MV Decel Time: 164 msec MV E velocity: 99.90 cm/s MV A velocity: 59.70 cm/s MV E/A ratio:  1.67 Oswaldo Milian MD Electronically signed by Oswaldo Milian MD Signature Date/Time: 06/08/2021/2:03:28 PM    Final    Disposition   Pt is being discharged home today in good condition.  Follow-up Plans & Appointments     Follow-up Information     Deberah Pelton, NP Follow up on 06/19/2021.   Specialty: Cardiology Why: at 2:45pm for your follow up appt with Dr. Ermalinda Memos' NP [place] information: [address] [place] [address] [phone]                Discharge Instructions     Amb Referral to Cardiac Rehabilitation   Complete by: As directed    Diagnosis:  Coronary Stents STEMI     After initial evaluation and assessments completed: Virtual Based Care may be provided alone or in conjunction with Phase 2 Cardiac Rehab based on patient barriers.: Yes   Call MD for:  difficulty breathing, headache or visual disturbances   Complete by: As directed    Call MD for:  persistant dizziness or light-headedness   Complete by: As directed    Call MD for:  redness, tenderness, or signs of infection (pain, swelling, redness, odor or green/yellow discharge around incision site)   Complete by: As directed    Diet - low sodium heart healthy   Complete by: As directed    Discharge instructions   Complete by: As directed    Radial Site Care Refer to this sheet in the next few weeks. These instructions provide you with information on caring for yourself after your procedure. Your caregiver may also give you more specific instructions. Your treatment has been planned according to current medical practices, but problems sometimes occur. Call your caregiver if you have any problems or questions after your  procedure. HOME CARE INSTRUCTIONS You may shower the day after the procedure. Remove the bandage (dressing) and gently wash the site with plain soap and water. Gently pat the site  dry.  Do not apply powder or lotion to the site.  Do not submerge the affected site in water for 3 to 5 days.  Inspect the site at least twice daily.  Do not flex or bend the affected arm for 24 hours.  No lifting over 5 pounds (2.3 kg) for 5 days after your procedure.  Do not drive home if you are discharged the same day of the procedure. Have someone else drive you.  You may drive 24 hours after the procedure unless otherwise instructed by your caregiver.  What to expect: Any bruising will usually fade within 1 to 2 weeks.  Blood that collects in the tissue (hematoma) may be painful to the touch. It should usually decrease in size and tenderness within 1 to 2 weeks.  SEEK IMMEDIATE MEDICAL CARE IF: You have unusual pain at the radial site.  You have redness, warmth, swelling, or pain at the radial site.  You have drainage (other than a small amount of blood on the dressing).  You have chills.  You have a fever or persistent symptoms for more than 72 hours.  You have a fever and your symptoms suddenly get worse.  Your arm becomes pale, cool, tingly, or numb.  You have heavy bleeding from the site. Hold pressure on the site.   PLEASE DO NOT MISS ANY DOSES OF YOUR BRILINTA!!!!! Also keep a log of you blood pressures and bring back to your follow up appt. Please call the office with any questions.   Patients taking blood thinners should generally stay away from medicines like ibuprofen, Advil, Motrin, naproxen, and Aleve due to risk of stomach bleeding. You may take Tylenol as directed or talk to your primary doctor about alternatives.  PLEASE ENSURE THAT YOU DO NOT RUN OUT OF YOUR BRILINTA. This medication is very important to remain on for at least one year. IF you have issues obtaining this medication due to  cost please CALL the office 3-5 business days prior to running out in order to prevent missing doses of this medication.   Increase activity slowly   Complete by: As directed        Discharge Medications   Allergies as of 06/10/2021       Reactions   Contrast Media [iodinated Contrast Media] Rash        Medication List     STOP taking these medications    hydrochlorothiazide 25 MG tablet Commonly known as: HYDRODIURIL   meloxicam 15 MG tablet Commonly known as: MOBIC   potassium chloride 10 MEQ tablet Commonly known as: KLOR-CON       TAKE these medications    aspirin 81 MG tablet Take 81 mg by mouth daily.   atorvastatin 40 MG tablet Commonly known as: LIPITOR Take 1 tablet (40 mg total) by mouth daily.   Brilinta 90 MG Tabs tablet Generic drug: ticagrelor Take 1 tablet (90 mg total) by mouth 2 (two) times daily.   gabapentin 300 MG capsule Commonly known as: NEURONTIN TAKE 1 CAPSULE BY MOUTH THREE TIMES A DAY What changed: See the new instructions.   metoprolol tartrate 25 MG tablet Commonly known as: LOPRESSOR Take 1 tablet (25 mg total) by mouth 2 (two) times daily.   multivitamin with minerals Tabs tablet Take 1 tablet by mouth every morning.   nitroGLYCERIN 0.4 MG SL tablet Commonly known as: Nitrostat Place 1 tablet (0.4 mg total) under the tongue every 5 (five) minutes as needed for chest pain.  pantoprazole 40 MG tablet Commonly known as: PROTONIX TAKE 1 TABLET BY MOUTH EVERY DAY   vitamin C 500 MG tablet Commonly known as: ASCORBIC ACID Take 500 mg by mouth daily. Reported on 10/27/2015         Outstanding Labs/Studies   N/a   Duration of Discharge Encounter   Greater than 30 minutes including physician time.  Signed, Shelva Majestic, MD 06/10/2021, 12:24 PM   Patient seen and examined. Agree with assessment and plan.  Patient feels well.  No recurrent chest pain. Rhytm regular without ectopy; tolerating metoprolol 25 mg bid.  Discussed procedures, medications and hospitalization with daughter.  OK for DC.   Troy Sine, MD, [facility] 06/10/2021 12:24 PM

## 2021-06-10 NOTE — Telephone Encounter (Signed)
-----   Message from Cheryln Manly, NP sent at 06/10/2021  8:55 AM EST ----- Regarding: TOC call Needs TOC call please

## 2021-06-10 NOTE — Telephone Encounter (Signed)
Hello,  The Pharmacy team is conducting a discharge transitions of care quality improvement initiative. The recommendations below are for your consideration.    Gabriel [name] is a 88 y.o. male (MRN: [id number], DOB: [birth year]) who was recently hospitalized on 06/06/2021 for ACS. They are anticipated to visit your clinic for post-discharge follow-up and may benefit from assistance with medication initiation and/or access.     Relevant medication access issues which may benefit from further intervention include: -Alerted patient that brilinta co-pay was $47/month - patient was concerned about cost - plan for 30 day of brilinta (since can use 30 day free card) but consider change to plavix at follow up appointment if still considered about cost   We appreciate your assistance with the implementation of these recommendations. Please let us know if there is anything we can help you with at this time.       Thanks!   Antonietta Jewel, PharmD, [place] Clinical Pharmacist  Phone: [phone] 06/10/2021 9:19 AM  Please check AMION for all [facility] phone numbers After 10:00 PM, call [place] [phone]

## 2021-06-11 ENCOUNTER — Telehealth: Payer: Self-pay | Admitting: Family Medicine

## 2021-06-11 ENCOUNTER — Other Ambulatory Visit: Payer: Self-pay

## 2021-06-11 ENCOUNTER — Other Ambulatory Visit (HOSPITAL_COMMUNITY): Payer: Self-pay

## 2021-06-11 MED ORDER — POTASSIUM CHLORIDE ER 10 MEQ PO TBCR
10.0000 meq | EXTENDED_RELEASE_TABLET | Freq: Every day | ORAL | 0 refills | Status: DC
  Filled 2021-06-11: qty 30, 30d supply, fill #0

## 2021-06-11 NOTE — Telephone Encounter (Signed)
Called and spoke with [name], she has some concerns about changes in the patients medication.     On 06/06/2021, patient had a heart attack.   While at the hospital some of his medications were changed, he no longer takes HCTZ, Meloxicam, and Potassium.      Daughter is very concerned about patient being taken off the Potassium.  While at the hospital he had to receive IV fluids with potassium added.        The nurse at the hospital told patient to "eat a banana".    New medications that patient was prescribed was Lipitor, Brintellix, and Nitroglycerin tablets.   Daughter is concerned should patient remain off these medications?    Please advise

## 2021-06-11 NOTE — Telephone Encounter (Signed)
I agree with stopping the HCTZ and the Meloxicam, but I recommend he get back on the potassium as before. Have him see [place] in 2 weeks to recheck some labs

## 2021-06-11 NOTE — Telephone Encounter (Signed)
Patient daughter [name] called in stating that patient was in the hospital on Saturday night due to a heart attack. She stated that the providers changed some of his medication. She's requesting for Dr. Sarajane Jews to review the medication and to give her a phone call back.  Patient could be contacted at [phone].  Please advise.

## 2021-06-11 NOTE — Telephone Encounter (Signed)
Spoke with pt daughter reviewed Dr Sarajane Jews advise, scheduled pt for a f/u appointment per Dr Sarajane Jews, Rx refill sent to pt pharmacy

## 2021-06-15 NOTE — Telephone Encounter (Signed)
Left message on patient  answer machine to call back

## 2021-06-15 NOTE — Telephone Encounter (Signed)
Call daughter Sherald Hess.  She states  patient is  at [facility].   RN informed daughter a message was left for patient to call back.      Daughter states patient will be coming the his appointment with patient 's other daughter Manuela Schwartz   [facility]  question were answered by daughter.   Patient's daughter [name] contacted regarding discharge from [facility] on 06/11/21.  Patient understands to follow up with provider Cleaver on 06/19/21 at 2:45 pm at [facility]. Patient understands discharge instructions? yes  Patient understands medications and regiment? yes  Patient understands to bring all medications to this visit? yes      Daughter wants to make sure the question is address about patient taking potassium on a daily basis.  Recently patient was hospitalized for low potassium   Will route to provider who will be seeing patient.

## 2021-06-16 ENCOUNTER — Telehealth: Payer: Self-pay | Admitting: Pharmacist

## 2021-06-16 ENCOUNTER — Telehealth: Payer: Self-pay | Admitting: Family Medicine

## 2021-06-16 ENCOUNTER — Telehealth (HOSPITAL_COMMUNITY): Payer: Self-pay

## 2021-06-16 NOTE — Telephone Encounter (Signed)
Please okay these orders. He had a recent MI

## 2021-06-16 NOTE — Telephone Encounter (Signed)
Spoke with Gabriel [name] with [facility], advised that Dr Sarajane Jews approved the verbal orders requested for PT,State that she faxed the orders to sign and the fax back to her. Awaiting for the order

## 2021-06-16 NOTE — Telephone Encounter (Signed)
Pharmacy Transitions of Care Follow-up Telephone Call  Date of discharge: 06/10/21  Discharge Diagnosis: STEMI  How have you been since you were released from the hospital? Doing well   Medication changes made at discharge: START taking: atorvastatin (LIPITOR) Brilinta (ticagrelor) metoprolol tartrate (LOPRESSOR) nitroGLYCERIN (Nitrostat) STOP taking: hydrochlorothiazide 25 MG tablet (HYDRODIURIL) meloxicam 15 MG tablet (MOBIC) potassium chloride 10 MEQ tablet (KLOR-CON)  Medication changes verified by the patient? Yes    Medication Accessibility:  Home Pharmacy: [id number] - [address], [place], [place] [address]    Was the patient provided with refills on discharged medications? Yes, 2   Have all prescriptions been transferred from [facility] to home pharmacy? No   Is the patient able to afford medications? Yes  Medication Review:  TICAGRELOR (BRILINTA) Ticagrelor 90 mg BID initiated on 06/10/21.  - Discussed importance of taking medication around the same time every day, - Reviewed potential DDIs with patient - Advised patient of medications to avoid (NSAIDs, aspirin maintenance doses>100 mg daily) - Educated that Tylenol (acetaminophen) will be the preferred analgesic to prevent risk of bleeding  - Emphasized importance of monitoring for signs and symptoms of bleeding (abnormal bruising, prolonged bleeding, nose bleeds, bleeding from gums, discolored urine, black tarry stools)  - Educated patient to notify doctor if shortness of breath or abnormal heartbeat occur - Advised patient to alert all providers of antiplatelet therapy prior to starting a new medication or having a procedure   Follow-up Appointments:  Two apts scheduled for 06/23/21 F/U visit with Dr.Stephen Sarajane Jews [facility] visit with Coletta Memos, NP   If their condition worsens, is the pt aware to call PCP or go to the Emergency Dept.? Yes  Final Patient Assessment: Pt is taking  medications well and understands them. He can afford the medications and has two apts scheduled for 2/28 to f/u with PCP and a TOC f/u. [facility] and is not interested in switching to a [facility] pharmacy at this time.

## 2021-06-16 NOTE — Telephone Encounter (Signed)
Pt insurance is active and benefits verified through Medicare A/B. Co-pay $0.00, DED $226.00/$226.00 met, out of pocket $0.00/$0.00 met, co-insurance 20%. No pre-authorization required. Passport, 06/16/21 @ 10:34AM, REF#[id number] °  °Will contact patient to see if he is interested in the Cardiac Rehab Program. If interested, patient will need to complete follow up appt. Once completed, patient will be contacted for scheduling upon review by the RN Navigator. °

## 2021-06-16 NOTE — Telephone Encounter (Signed)
[facility] is requesting verbal orders for patient. Forest Gleason is requesting PT, OT, and speech therapy regarding to him being hospitalized. Forest Gleason is asking for patient to be evaluated for medication management.  Forest Gleason could be contacted at [phone].  Verbal order could be faxed over [phone].  Please advise.

## 2021-06-16 NOTE — Telephone Encounter (Signed)
Called and spoke with in regards to CR, pt stated he is not interested at this time.   Closed referral

## 2021-06-18 ENCOUNTER — Telehealth: Payer: Self-pay | Admitting: Family Medicine

## 2021-06-18 NOTE — Telephone Encounter (Signed)
Pt signed orders faxed to the nurse at [facility] as requested, copy sent to scanning

## 2021-06-18 NOTE — Telephone Encounter (Signed)
Left a detailed message for Gabriel [name] regarding pt orders faxed to [facility] ( PT/OT/ST), advised to call the office with any questions, copy of the fax is on [employer]

## 2021-06-18 NOTE — Telephone Encounter (Signed)
Spoke with Helene Kelp with [facility], confirmed they received the signed orders for pt PT/OT/ST

## 2021-06-18 NOTE — Telephone Encounter (Signed)
[facility] call and stated she need a call back about a order that she didn't receive on the 21th she would like for you to give her a call at [phone]

## 2021-06-18 NOTE — Telephone Encounter (Signed)
Please see what this is about

## 2021-06-18 NOTE — Telephone Encounter (Signed)
See previous phone note form 06/16/21

## 2021-06-19 ENCOUNTER — Ambulatory Visit: Payer: Medicare Other | Admitting: General Practice

## 2021-06-19 DIAGNOSIS — R4189 Other symptoms and signs involving cognitive functions and awareness: Secondary | ICD-10-CM | POA: Diagnosis not present

## 2021-06-19 DIAGNOSIS — R29898 Other symptoms and signs involving the musculoskeletal system: Secondary | ICD-10-CM | POA: Diagnosis not present

## 2021-06-19 DIAGNOSIS — N3946 Mixed incontinence: Secondary | ICD-10-CM | POA: Diagnosis not present

## 2021-06-19 DIAGNOSIS — R41841 Cognitive communication deficit: Secondary | ICD-10-CM | POA: Diagnosis not present

## 2021-06-19 DIAGNOSIS — M6281 Muscle weakness (generalized): Secondary | ICD-10-CM | POA: Diagnosis not present

## 2021-06-19 DIAGNOSIS — R2681 Unsteadiness on feet: Secondary | ICD-10-CM | POA: Diagnosis not present

## 2021-06-19 DIAGNOSIS — I213 ST elevation (STEMI) myocardial infarction of unspecified site: Secondary | ICD-10-CM | POA: Diagnosis not present

## 2021-06-22 ENCOUNTER — Ambulatory Visit (INDEPENDENT_AMBULATORY_CARE_PROVIDER_SITE_OTHER): Payer: Medicare Other

## 2021-06-22 ENCOUNTER — Other Ambulatory Visit: Payer: Self-pay

## 2021-06-22 VITALS — Ht 67.0 in | Wt 155.0 lb

## 2021-06-22 DIAGNOSIS — Z0000 Encounter for general adult medical examination without abnormal findings: Secondary | ICD-10-CM

## 2021-06-22 NOTE — Patient Instructions (Signed)
Gabriel [name] , Thank you for taking time to come for your Medicare Wellness Visit. I appreciate your ongoing commitment to your health goals. Please review the following plan we discussed and let me know if I can assist you in the future.   Screening recommendations/referrals: Colonoscopy: No longer required due to age.   Recommended yearly ophthalmology/optometry visit for glaucoma screening and checkup Recommended yearly dental visit for hygiene and checkup  Vaccinations: Influenza vaccine: Due Repeat annually  Pneumococcal vaccine: Done 03/13/2008 and 09/23/2015 Tdap vaccine: Done 12/20/2007 Repeat in 10 years  Shingles vaccine: Done 03/11/2020 and 06/20/2020. Covid-19: Done 05/02/2019, 05/28/2019, 03/04/2020 and 09/23/2020   Advanced directives: Advance directive discussed with you today. Even though you declined this today, please call our office should you change your mind, and we can give you the proper paperwork for you to fill out.   Conditions/risks identified: KEEP UP THE GOOD WORK!!  Next appointment: Follow up in one year for your annual wellness visit. 2024.  Preventive Care 30 Years and Older, Male  Preventive care refers to lifestyle choices and visits with your health care provider that can promote health and wellness. What does preventive care include? A yearly physical exam. This is also called an annual well check. Dental exams once or twice a year. Routine eye exams. Ask your health care provider how often you should have your eyes checked. Personal lifestyle choices, including: Daily care of your teeth and gums. Regular physical activity. Eating a healthy diet. Avoiding tobacco and drug use. Limiting alcohol use. Practicing safe sex. Taking low doses of aspirin every day. Taking vitamin and mineral supplements as recommended by your health care provider. What happens during an annual well check? The services and screenings done by your health care provider during  your annual well check will depend on your age, overall health, lifestyle risk factors, and family history of disease. Counseling  Your health care provider may ask you questions about your: Alcohol use. Tobacco use. Drug use. Emotional well-being. Home and relationship well-being. Sexual activity. Eating habits. History of falls. Memory and ability to understand (cognition). Work and work Statistician. Screening  You may have the following tests or measurements: Height, weight, and BMI. Blood pressure. Lipid and cholesterol levels. These may be checked every 5 years, or more frequently if you are over 38 years old. Skin check. Lung cancer screening. You may have this screening every year starting at age 84 if you have a 30-pack-year history of smoking and currently smoke or have quit within the past 15 years. Fecal occult blood test (FOBT) of the stool. You may have this test every year starting at age 60. Flexible sigmoidoscopy or colonoscopy. You may have a sigmoidoscopy every 5 years or a colonoscopy every 10 years starting at age 74. Prostate cancer screening. Recommendations will vary depending on your family history and other risks. Hepatitis C blood test. Hepatitis B blood test. Sexually transmitted disease (STD) testing. Diabetes screening. This is done by checking your blood sugar (glucose) after you have not eaten for a while (fasting). You may have this done every 1-3 years. Abdominal aortic aneurysm (AAA) screening. You may need this if you are a current or former smoker. Osteoporosis. You may be screened starting at age 54 if you are at high risk. Talk with your health care provider about your test results, treatment options, and if necessary, the need for more tests. Vaccines  Your health care provider may recommend certain vaccines, such as: Influenza vaccine. This  is recommended every year. Tetanus, diphtheria, and acellular pertussis (Tdap, Td) vaccine. You may need  a Td booster every 10 years. Zoster vaccine. You may need this after age 21. Pneumococcal 13-valent conjugate (PCV13) vaccine. One dose is recommended after age 53. Pneumococcal polysaccharide (PPSV23) vaccine. One dose is recommended after age 35. Talk to your health care provider about which screenings and vaccines you need and how often you need them. This information is not intended to replace advice given to you by your health care provider. Make sure you discuss any questions you have with your health care provider. Document Released: 05/09/2015 Document Revised: 12/31/2015 Document Reviewed: 02/11/2015 Elsevier Interactive Patient Education  2017 [place] Prevention in the Home Falls can cause injuries. They can happen to people of all ages. There are many things you can do to make your home safe and to help prevent falls. What can I do on the outside of my home? Regularly fix the edges of walkways and driveways and fix any cracks. Remove anything that might make you trip as you walk through a door, such as a raised step or threshold. Trim any bushes or trees on the path to your home. Use bright outdoor lighting. Clear any walking paths of anything that might make someone trip, such as rocks or tools. Regularly check to see if handrails are loose or broken. Make sure that both sides of any steps have handrails. Any raised decks and porches should have guardrails on the edges. Have any leaves, snow, or ice cleared regularly. Use sand or salt on walking paths during winter. Clean up any spills in your garage right away. This includes oil or grease spills. What can I do in the bathroom? Use night lights. Install grab bars by the toilet and in the tub and shower. Do not use towel bars as grab bars. Use non-skid mats or decals in the tub or shower. If you need to sit down in the shower, use a plastic, non-slip stool. Keep the floor dry. Clean up any water that spills on the  floor as soon as it happens. Remove soap buildup in the tub or shower regularly. Attach bath mats securely with double-sided non-slip rug tape. Do not have throw rugs and other things on the floor that can make you trip. What can I do in the bedroom? Use night lights. Make sure that you have a light by your bed that is easy to reach. Do not use any sheets or blankets that are too big for your bed. They should not hang down onto the floor. Have a firm chair that has side arms. You can use this for support while you get dressed. Do not have throw rugs and other things on the floor that can make you trip. What can I do in the kitchen? Clean up any spills right away. Avoid walking on wet floors. Keep items that you use a lot in easy-to-reach places. If you need to reach something above you, use a strong step stool that has a grab bar. Keep electrical cords out of the way. Do not use floor polish or wax that makes floors slippery. If you must use wax, use non-skid floor wax. Do not have throw rugs and other things on the floor that can make you trip. What can I do with my stairs? Do not leave any items on the stairs. Make sure that there are handrails on both sides of the stairs and use them. Fix handrails that  are broken or loose. Make sure that handrails are as long as the stairways. Check any carpeting to make sure that it is firmly attached to the stairs. Fix any carpet that is loose or worn. Avoid having throw rugs at the top or bottom of the stairs. If you do have throw rugs, attach them to the floor with carpet tape. Make sure that you have a light switch at the top of the stairs and the bottom of the stairs. If you do not have them, ask someone to add them for you. What else can I do to help prevent falls? Wear shoes that: Do not have high heels. Have rubber bottoms. Are comfortable and fit you well. Are closed at the toe. Do not wear sandals. If you use a stepladder: Make sure that  it is fully opened. Do not climb a closed stepladder. Make sure that both sides of the stepladder are locked into place. Ask someone to hold it for you, if possible. Clearly mark and make sure that you can see: Any grab bars or handrails. First and last steps. Where the edge of each step is. Use tools that help you move around (mobility aids) if they are needed. These include: Canes. Walkers. Scooters. Crutches. Turn on the lights when you go into a dark area. Replace any light bulbs as soon as they burn out. Set up your furniture so you have a clear path. Avoid moving your furniture around. If any of your floors are uneven, fix them. If there are any pets around you, be aware of where they are. Review your medicines with your doctor. Some medicines can make you feel dizzy. This can increase your chance of falling. Ask your doctor what other things that you can do to help prevent falls. This information is not intended to replace advice given to you by your health care provider. Make sure you discuss any questions you have with your health care provider. Document Released: 02/06/2009 Document Revised: 09/18/2015 Document Reviewed: 05/17/2014 Elsevier Interactive Patient Education  2017 [employer].

## 2021-06-22 NOTE — Progress Notes (Addendum)
Subjective:   Gabriel [name] is a 88 y.o. male who presents for Medicare Annual/Subsequent preventive examination. Virtual Visit via Telephone Note  I connected with  Gabriel [name] on 06/22/21 at  2:45 PM EST by telephone and verified that I am speaking with the correct person using two identifiers.  Location: Patient: HOME Provider: [facility] Persons participating in the virtual visit: patient/Nurse Health Advisor   I discussed the limitations, risks, security and privacy concerns of performing an evaluation and management service by telephone and the availability of in person appointments. The patient expressed understanding and agreed to proceed.  Interactive audio and video telecommunications were attempted between this nurse and patient, however failed, due to patient having technical difficulties OR patient did not have access to video capability.  We continued and completed visit with audio only.  Some vital signs may be absent or patient reported.   Chriss Driver, LPN  Review of Systems     Cardiac Risk Factors include: advanced age (>78men, >20 women);hypertension;male gender;sedentary lifestyle     Objective:    Today's Vitals   06/22/21 1455 06/22/21 1459  Weight: 155 lb (70.3 kg)   Height: 5\' 7"  (1.702 m)   PainSc:  0-No pain   Body mass index is 24.28 kg/m.  Advanced Directives 06/22/2021 06/07/2021 11/28/2020 06/19/2020 02/21/2020 01/30/2018 10/20/2016  Does Patient Have a Medical Advance Directive? No No No Yes No Yes Yes  Type of Advance Directive - - Public librarian;Living will - - Living will  Does patient want to make changes to medical advance directive? - - - No - Patient declined - - -  Copy of [place] in Chart? - - - No - copy requested - - -  Would patient like information on creating a medical advance directive? No - Patient declined No - Patient declined - - No - Patient declined - -    Current Medications  (verified) Outpatient Encounter Medications as of 06/22/2021  Medication Sig   aspirin 81 MG tablet Take 81 mg by mouth daily.   atorvastatin (LIPITOR) 40 MG tablet Take 1 tablet (40 mg total) by mouth daily.   gabapentin (NEURONTIN) 300 MG capsule TAKE 1 CAPSULE BY MOUTH THREE TIMES A DAY (Patient taking differently: Take 300 mg by mouth 3 (three) times daily.)   metoprolol tartrate (LOPRESSOR) 25 MG tablet Take 1 tablet (25 mg total) by mouth 2 (two) times daily.   Multiple Vitamin (MULTIVITAMIN WITH MINERALS) TABS tablet Take 1 tablet by mouth every morning.   nitroGLYCERIN (NITROSTAT) 0.4 MG SL tablet Place 1 tablet (0.4 mg total) under the tongue every 5 (five) minutes as needed for chest pain.   pantoprazole (PROTONIX) 40 MG tablet TAKE 1 TABLET BY MOUTH EVERY DAY (Patient taking differently: Take 40 mg by mouth daily.)   potassium chloride (KLOR-CON 10) 10 MEQ tablet Take 1 tablet (10 mEq total) by mouth daily.   ticagrelor (BRILINTA) 90 MG TABS tablet Take 1 tablet (90 mg total) by mouth 2 (two) times daily.   vitamin C (ASCORBIC ACID) 500 MG tablet Take 500 mg by mouth daily. Reported on 10/27/2015   No facility-administered encounter medications on file as of 06/22/2021.    Allergies (verified) Contrast media [iodinated contrast media]   History: Past Medical History:  Diagnosis Date   Arthritis    Diverticulosis of colon    GERD (gastroesophageal reflux disease)    Hypertension    Past Surgical History:  Procedure Laterality Date   cataract extaction, right  6/09   per Dr. [name]   CATARACT EXTRACTION Left    COLONOSCOPY  09/23/04   per Dr. [name], clear, no repeats needed    CORONARY STENT INTERVENTION N/A 06/06/2021   Procedure: CORONARY STENT INTERVENTION;  Surgeon: [name], MD;  Location: [place] [facility];  Service: Cardiovascular;  Laterality: N/A;   CORONARY/GRAFT ACUTE MI REVASCULARIZATION N/A 06/06/2021   Procedure: Coronary/Graft Acute MI  Revascularization;  Surgeon: [name], MD;  Location: [place] [facility];  Service: Cardiovascular;  Laterality: N/A;   EGD and esophageal diatation,  05-10-11   per Dr. [name]     HERNIA REPAIR     LEFT HEART CATH AND CORONARY ANGIOGRAPHY N/A 06/06/2021   Procedure: LEFT HEART CATH AND CORONARY ANGIOGRAPHY;  Surgeon: [name], MD;  Location: [place] [facility];  Service: Cardiovascular;  Laterality: N/A;   rt inguinal hernia     x2   Family History  Problem Relation Age of Onset   Coronary artery disease Father    Colon cancer Neg Hx    Stomach cancer Neg Hx    Esophageal cancer Neg Hx    Social History   Socioeconomic History   Marital status: Widowed    Spouse name: Not on file   Number of children: 3   Years of education: Not on file   Highest education level: Not on file  Occupational History   Occupation: retired  Tobacco Use   Smoking status: Former    Packs/day: 15.00    Years: 0.50    Pack years: 7.50    Types: Cigarettes   Smokeless tobacco: Never   Tobacco comments:    smoked 18 and stopped and states he stopped 30's   Vaping Use   Vaping Use: Never used  Substance and Sexual Activity   Alcohol use: No    Alcohol/week: 0.0 standard drinks   Drug use: No   Sexual activity: Never    Birth control/protection: Abstinence  Other Topics Concern   Not on file  Social History Narrative   Lives in studio at [place] since 2009   3 daughters.    Social Determinants of Health   Financial Resource Strain: Low Risk    Difficulty of Paying Living Expenses: Not hard at all  Food Insecurity: No Food Insecurity   Worried About Charity fundraiser in the Last Year: Never true   [place] in the Last Year: Never true  Transportation Needs: No Transportation Needs   Lack of Transportation (Medical): No   Lack of Transportation (Non-Medical): No  Physical Activity: Insufficiently Active   Days of Exercise per Week: 3 days   Minutes  of Exercise per Session: 30 min  Stress: No Stress Concern Present   Feeling of Stress : Only a little  Social Connections: Moderately Integrated   Frequency of Communication with Friends and Family: More than three times a week   Frequency of Social Gatherings with Friends and Family: More than three times a week   Attends Religious Services: 1 to 4 times per year   Active Member of [employer] or Organizations: No   Attends Archivist Meetings: 1 to 4 times per year   Marital Status: Widowed    Tobacco Counseling Counseling given: Not Answered Tobacco comments: smoked 18 and stopped and states he stopped 30's    Clinical Intake:  Pre-visit preparation completed: Yes  Pain :  No/denies pain Pain Score: 0-No pain     BMI - recorded: 24.28 Nutritional Status: BMI of 19-24  Normal Nutritional Risks: None Diabetes: No  How often do you need to have someone help you when you read instructions, pamphlets, or other written materials from your doctor or pharmacy?: 1 - Never  Diabetic?No  Interpreter Needed?: No  Information entered by :: mj Liberato Stansbery, lpn   Activities of Daily Living In your present state of health, do you have any difficulty performing the following activities: 06/22/2021 06/07/2021  Hearing? Tempie Donning  Vision? N Y  Difficulty concentrating or making decisions? N N  Walking or climbing stairs? Y Y  Dressing or bathing? N Y  Doing errands, shopping? Y Y  Using the Toilet? N -  In the past six months, have you accidently leaked urine? N -  Do you have problems with loss of bowel control? N -  Managing your Medications? Y -  Managing your Finances? Y -  Housekeeping or managing your Housekeeping? Y -  Some recent data might be hidden    Patient Care Team: Laurey Morale, MD as PCP - General Claiborne Billings Joyice Faster, MD as PCP - Cardiology (Cardiology) Clent Jacks, MD as Consulting Physician (Ophthalmology) Zadie Rhine Clent Demark, MD as Consulting Physician  (Ophthalmology)  Indicate any recent Medical Services you may have received from other than [facility] providers in the past year (date may be approximate).     Assessment:   This is a routine wellness examination for [place].  Hearing/Vision screen Hearing Screening - Comments:: Wears hearing aids. Vision Screening - Comments:: Glasses. Dr. Katy Fitch. 2022.  Dietary issues and exercise activities discussed: Current Exercise Habits: Structured exercise class, Type of exercise: walking;Other - see comments (currently undergoing PT), Time (Minutes): 30, Frequency (Times/Week): 3, Weekly Exercise (Minutes/Week): 90, Intensity: Mild, Exercise limited by: cardiac condition(s);neurologic condition(s)   Goals Addressed             This Visit's Progress    Patient Stated   On track    Keep exercising; and stay engaged in the community        Depression Screen PHQ 2/9 Scores 06/22/2021 06/19/2020 05/30/2019 09/29/2018 01/30/2018 10/20/2016 10/17/2015  PHQ - 2 Score 0 0 0 0 0 0 0    Fall Risk Fall Risk  06/22/2021 06/19/2020 05/30/2019 03/15/2018 01/30/2018  Falls in the past year? 1 0 0 0 No  Comment - - - Emmi Telephone Survey: data to providers prior to load -  Number falls in past yr: 0 0 - - -  Injury with Fall? 0 0 - - -  Risk for fall due to : History of fall(s);Impaired balance/gait Impaired balance/gait;History of fall(s) Medication side effect - -  Follow up Falls prevention discussed Falls evaluation completed;Falls prevention discussed Education provided;Falls prevention discussed;Falls evaluation completed - -    FALL RISK PREVENTION PERTAINING TO THE HOME:  Any stairs in or around the home? No  If so, are there any without handrails? No  Home free of loose throw rugs in walkways, pet beds, electrical cords, etc? Yes  Adequate lighting in your home to reduce risk of falls? Yes   ASSISTIVE DEVICES UTILIZED TO PREVENT FALLS:  Life alert? Yes  Use of a cane, walker or w/c? Yes  Grab bars  in the bathroom? Yes  Shower chair or bench in shower? Yes  Elevated toilet seat or a handicapped toilet? Yes   TIMED UP AND GO:  Was the test performed?  No .  Phone visit.  Cognitive Function: MMSE - Mini Mental State Exam 01/30/2018 10/20/2016 10/17/2015  Not completed: (No Data) - -  Orientation to time - 4 5  Orientation to Place - 5 5  Registration - 3 3  Attention/ Calculation - 5 1  Recall - 3 3  Language- name 2 objects - 2 2  Language- repeat - 1 1  Language- follow 3 step command - 3 3  Language- read & follow direction - 1 1  Write a sentence - 1 1  Copy design - 1 1  Total score - 29 26     6CIT Screen 06/22/2021 05/30/2019  What Year? 0 points 0 points  What month? 0 points 0 points  What time? 0 points 0 points  Count back from 20 0 points 0 points  Months in reverse 0 points 0 points  Repeat phrase 0 points 0 points  Total Score 0 0    Immunizations Immunization History  Administered Date(s) Administered   Influenza Split 01/20/2012, 01/07/2013   Influenza Whole 02/09/2006, 01/11/2008   Influenza, High Dose Seasonal PF 12/30/2016, 12/27/2017, 01/08/2019   Influenza,inj,quad, With Preservative 12/29/2018   Influenza-Unspecified 01/17/2014, 01/14/2016, 12/30/2016   PFIZER Comirnaty(Gray Top)Covid-19 Tri-Sucrose Vaccine 05/28/2019, 03/04/2020, 09/23/2020   Pneumococcal Conjugate-13 09/23/2015   Pneumococcal Polysaccharide-23 03/13/2008   Td 12/20/2007   Unspecified SARS-COV-2 Vaccination 04/30/2019   Zoster Recombinat (Shingrix) 03/11/2020, 06/20/2020    TDAP status: Due, Education has been provided regarding the importance of this vaccine. Advised may receive this vaccine at local pharmacy or Health Dept. Aware to provide a copy of the vaccination record if obtained from local pharmacy or Health Dept. Verbalized acceptance and understanding.  Flu Vaccine status: Due, Education has been provided regarding the importance of this vaccine. Advised may receive  this vaccine at local pharmacy or Health Dept. Aware to provide a copy of the vaccination record if obtained from local pharmacy or Health Dept. Verbalized acceptance and understanding.  Pneumococcal vaccine status: Up to date  Covid-19 vaccine status: Completed vaccines  Qualifies for Shingles Vaccine? Yes   Zostavax completed Yes   Shingrix Completed?: Yes  Screening Tests Health Maintenance  Topic Date Due   FOOT EXAM  Never done   URINE MICROALBUMIN  Never done   TETANUS/TDAP  12/19/2017   OPHTHALMOLOGY EXAM  10/24/2018   COVID-19 Vaccine (5 - Booster) 11/18/2020   INFLUENZA VACCINE  11/24/2020   HEMOGLOBIN A1C  12/04/2021   Pneumonia Vaccine 20+ Years old  Completed   Zoster Vaccines- Shingrix  Completed   HPV VACCINES  Aged Out    Health Maintenance  Health Maintenance Due  Topic Date Due   FOOT EXAM  Never done   URINE MICROALBUMIN  Never done   TETANUS/TDAP  12/19/2017   OPHTHALMOLOGY EXAM  10/24/2018   COVID-19 Vaccine (5 - Booster) 11/18/2020   INFLUENZA VACCINE  11/24/2020    Colorectal cancer screening: No longer required.   Lung Cancer Screening: (Low Dose CT Chest recommended if Age 75-80 years, 30 pack-year currently smoking OR have quit w/in 15years.) does not qualify.  Additional Screening:  Hepatitis C Screening: does not qualify.  Vision Screening: Recommended annual ophthalmology exams for early detection of glaucoma and other disorders of the eye. Is the patient up to date with their annual eye exam?  Yes  Who is the provider or what is the name of the office in which the patient attends annual eye exams? Dr. Katy Fitch If pt is  not established with a provider, would they like to be referred to a provider to establish care? No .   Dental Screening: Recommended annual dental exams for proper oral hygiene  Community Resource Referral / Chronic Care Management: CRR required this visit?  No   CCM required this visit?  No      Plan:     I have  personally reviewed and noted the following in the patients chart:   Medical and social history Use of alcohol, tobacco or illicit drugs  Current medications and supplements including opioid prescriptions. Patient is not currently taking opioid prescriptions. Functional ability and status Nutritional status Physical activity Advanced directives List of other physicians Hospitalizations, surgeries, and ER visits in previous 12 months Vitals Screenings to include cognitive, depression, and falls Referrals and appointments  In addition, I have reviewed and discussed with patient certain preventive protocols, quality metrics, and best practice recommendations. A written personalized care plan for preventive services as well as general preventive health recommendations were provided to patient.     Chriss Driver, LPN   5/68/1275   Nurse Notes: Pt is up to date on age appropriate health maintenance and vaccines. 6CIT score of 0 I have read this note and agree with its contents. Alysia Penna, MD

## 2021-06-22 NOTE — Progress Notes (Signed)
Office Visit    Patient Name: Gabriel [name] Date of Encounter: 06/23/2021  Primary Care Provider:  Laurey Morale, MD Primary Cardiologist:  Shelva Majestic, MD  Chief Complaint    88 year old male with a history of CAD s/p STEMI, s/p DES-m-dLAD, chronic systolic heart failure, ischemic cardiomyopathy, paroxysmal atrial fibrillation, hypertension, hyperlipidemia, GERD and arthritis who presents for follow-up post hospitalization related to CAD.  Past Medical History    Past Medical History:  Diagnosis Date   Arthritis    Diverticulosis of colon    GERD (gastroesophageal reflux disease)    Hypertension    Past Surgical History:  Procedure Laterality Date   cataract extaction, right  6/09   per Dr. [name]   CATARACT EXTRACTION Left    COLONOSCOPY  09/23/04   per Dr. [name], clear, no repeats needed    CORONARY STENT INTERVENTION N/A 06/06/2021   Procedure: CORONARY STENT INTERVENTION;  Surgeon: [name], MD;  Location: [place] [facility];  Service: Cardiovascular;  Laterality: N/A;   CORONARY/GRAFT ACUTE MI REVASCULARIZATION N/A 06/06/2021   Procedure: Coronary/Graft Acute MI Revascularization;  Surgeon: [name], MD;  Location: [place] [facility];  Service: Cardiovascular;  Laterality: N/A;   EGD and esophageal diatation,  05-10-11   per Dr. [name]     HERNIA REPAIR     LEFT HEART CATH AND CORONARY ANGIOGRAPHY N/A 06/06/2021   Procedure: LEFT HEART CATH AND CORONARY ANGIOGRAPHY;  Surgeon: [name], MD;  Location: [place] [facility];  Service: Cardiovascular;  Laterality: N/A;   rt inguinal hernia     x2    Allergies  Allergies  Allergen Reactions   Contrast Media [Iodinated Contrast Media] Rash    History of Present Illness    88 year old male with the above past medical history including CAD s/p STEMI, s/p DES-m-dLAD, chronic systolic heart failure, ischemic cardiomyopathy, paroxysmal atrial fibrillation, hypertension, hyperlipidemia, GERD and  arthritis.  Patient presented to the ED on 06/06/2021 with a 2-day history of intermittent chest pain, EKG showed STEMI, troponin was elevated ([id number]). He was hospitalized from 06/06/2021 to 06/10/2021. He underwent emergent cardiac catheterization which revealed single-vessel CAD with thrombotic 90% mid LAD stenosis and 99% subtotal occlusion of the distal LAD, otherwise mild nonobstructive CAD in the left circumflex.  RCA was ectatic without significant stenosis.  He underwent successful PCI to mid and distal LAD using nonoverlapping DES (3.0 x 12 mm (mid LAD) and 2.0 x 15 mm (distal LAD) drug-eluting stents).  He was started on DAPT with aspirin and Brilinta.  Echocardiogram showed EF 40 to 45%, apical anterior wall with hypokinesis.  Initial EKG on presentation was concerning for new onset atrial fibrillation.  Repeat EKG more consistent with sinus bradycardia, with occasional junctional beats. Given advanced age, and need for DAPT, DOAC was not started as he was not considered an ideal candidate for triple therapy. He had no further episodes of atrial fibrillation during his hospitalization. He was discharged home on 06/10/2021 in stable condition.  He presents today for follow-up accompanied by his daughter.  Since his hospitalization has done well from a cardiac standpoint.  He denies any symptoms concerning for angina.  Overall, he reports feeling well and denies any specific concerns or complaints today.  Home Medications    Current Outpatient Medications  Medication Sig Dispense Refill   aspirin 81 MG tablet Take 81 mg by mouth daily.     gabapentin (NEURONTIN) 300 MG capsule TAKE 1 CAPSULE BY MOUTH THREE  TIMES A DAY (Patient taking differently: Take 300 mg by mouth 3 (three) times daily.) 270 capsule 1   Multiple Vitamin (MULTIVITAMIN WITH MINERALS) TABS tablet Take 1 tablet by mouth every morning.     nitroGLYCERIN (NITROSTAT) 0.4 MG SL tablet Place 1 tablet (0.4 mg total) under the tongue  every 5 (five) minutes as needed for chest pain. 25 tablet 3   pantoprazole (PROTONIX) 40 MG tablet TAKE 1 TABLET BY MOUTH EVERY DAY (Patient taking differently: Take 40 mg by mouth daily.) 90 tablet 3   potassium chloride (KLOR-CON 10) 10 MEQ tablet Take 1 tablet (10 mEq total) by mouth daily. 30 tablet 0   vitamin C (ASCORBIC ACID) 500 MG tablet Take 500 mg by mouth daily. Reported on 10/27/2015     atorvastatin (LIPITOR) 40 MG tablet Take 1 tablet (40 mg total) by mouth daily. 90 tablet 3   metoprolol tartrate (LOPRESSOR) 25 MG tablet Take 1 tablet (25 mg total) by mouth 2 (two) times daily. 180 tablet 3   ticagrelor (BRILINTA) 90 MG TABS tablet Take 1 tablet (90 mg total) by mouth 2 (two) times daily. 180 tablet 3   No current facility-administered medications for this visit.     Review of Systems    He denies chest pain, palpitations, dyspnea, pnd, orthopnea, n, v, dizziness, syncope, edema, weight gain, or early satiety. All other systems reviewed and are otherwise negative except as noted above.   Physical Exam    VS:  BP 130/62 (BP Location: Left Arm, Patient Position: Sitting, Cuff Size: Normal)    Pulse (!) 55    Ht 5\' 7"  (1.702 m)    Wt 156 lb 9.6 oz (71 kg)    BMI 24.53 kg/m   GEN: Well nourished, well developed, in no acute distress. HEENT: normal. Neck: Supple, no JVD, carotid bruits, or masses. Cardiac: RRR, no murmurs, rubs, or gallops. No clubbing, cyanosis, edema.  Radials/DP/PT 2+ and equal bilaterally.  Right radial cath site without bruising, bleeding, or hematoma. Respiratory:  Respirations regular and unlabored, clear to auscultation bilaterally. GI: Soft, nontender, nondistended, BS + x 4. MS: no deformity or atrophy. Skin: warm and dry, no rash. Neuro:  Strength and sensation are intact. Psych: Normal affect.  Accessory Clinical Findings    ECG personally reviewed by me today -sinus bradycardia with first-degree AV block, 55 bpm nonspecific T wave abnormality-  no acute changes.  Lab Results  Component Value Date   WBC 10.5 06/10/2021   HGB 13.4 06/10/2021   HCT 37.0 (L) 06/10/2021   MCV 89.2 06/10/2021   PLT 270 06/10/2021   Lab Results  Component Value Date   CREATININE 0.70 06/10/2021   BUN 13 06/10/2021   NA 132 (L) 06/10/2021   K 3.8 06/10/2021   CL 102 06/10/2021   CO2 21 (L) 06/10/2021   Lab Results  Component Value Date   ALT 30 06/06/2021   AST 47 (H) 06/06/2021   ALKPHOS 95 06/06/2021   BILITOT 1.6 (H) 06/06/2021   Lab Results  Component Value Date   CHOL 151 06/06/2021   HDL 44 06/06/2021   LDLCALC 77 06/06/2021   LDLDIRECT 72.0 09/27/2017   TRIG 151 (H) 06/06/2021   CHOLHDL 3.4 06/06/2021    Lab Results  Component Value Date   HGBA1C 5.0 06/06/2021    Assessment & Plan   1. CAD: S/p STEMI, DES x2 (mLAD and dLAD). Cath showed otherwise mild nonobstructive CAD in the left circumflex.  RCA was  ectatic without significant stenosis. Echocardiogram showed EF 40 to 45%, apical anterior wall with hypokinesis. Stable with no anginal symptoms. Continue aspirin, brilinta, metoprolol, and Lipitor.  2. Chronic systolic heart failure/ischemic cardiomyopathy: Recent echo as above.  Euvolemic and well compensated on exam.  Consider repeat echo in 2 to 3 months.  Due to advanced age and given recent STEMI, will defer further escalation of GDMT at this time and consider repeat echo in 2 to 3 months.  If EF remains reduced, can consider escalation of GDMT at that time.  3. Paroxysmal atrial fibrillation: This was a questionable diagnosis during recent admission, ultimately thought to be sinus bradycardia with occasional junctional beats. No documented recurrence.  He denies any palpitations, dizziness, dyspnea.  Should he have symptoms in the future could consider outpatient cardiac monitor. He is not an ideal candidate for triple therapy as he is on aspirin and Brilinta given recent MI and stenting and in setting of advanced age,  therefore, no DOAC at this time.  EKG today shows sinus bradycardia, first-degree AV block, 55 bpm.  He is asymptomatic with low heart rate.  Continue metoprolol.  4. Hypertension: BP well controlled. Continue current antihypertensive regimen.   5. Hyperlipidemia: LDL was 77 in 05/2020.  Continue atorvastatin.  Consider repeat lipids, LFTs at follow-up visit.  6. Disposition: Follow-up in 3 months.  Lenna Sciara, NP 06/23/2021, 4:38 PM

## 2021-06-23 ENCOUNTER — Ambulatory Visit (INDEPENDENT_AMBULATORY_CARE_PROVIDER_SITE_OTHER): Payer: Medicare Other | Admitting: Family Medicine

## 2021-06-23 ENCOUNTER — Ambulatory Visit (INDEPENDENT_AMBULATORY_CARE_PROVIDER_SITE_OTHER): Payer: Medicare Other | Admitting: Nurse Practitioner

## 2021-06-23 ENCOUNTER — Encounter (HOSPITAL_BASED_OUTPATIENT_CLINIC_OR_DEPARTMENT_OTHER): Payer: Self-pay | Admitting: Nurse Practitioner

## 2021-06-23 ENCOUNTER — Encounter: Payer: Self-pay | Admitting: Family Medicine

## 2021-06-23 VITALS — BP 128/70 | HR 52 | Temp 98.5°F | Wt 155.4 lb

## 2021-06-23 VITALS — BP 130/62 | HR 55 | Ht 67.0 in | Wt 156.6 lb

## 2021-06-23 DIAGNOSIS — I25118 Atherosclerotic heart disease of native coronary artery with other forms of angina pectoris: Secondary | ICD-10-CM

## 2021-06-23 DIAGNOSIS — I10 Essential (primary) hypertension: Secondary | ICD-10-CM | POA: Diagnosis not present

## 2021-06-23 DIAGNOSIS — E785 Hyperlipidemia, unspecified: Secondary | ICD-10-CM | POA: Diagnosis not present

## 2021-06-23 DIAGNOSIS — I480 Paroxysmal atrial fibrillation: Secondary | ICD-10-CM

## 2021-06-23 DIAGNOSIS — I5022 Chronic systolic (congestive) heart failure: Secondary | ICD-10-CM | POA: Diagnosis not present

## 2021-06-23 DIAGNOSIS — I2102 ST elevation (STEMI) myocardial infarction involving left anterior descending coronary artery: Secondary | ICD-10-CM

## 2021-06-23 MED ORDER — METOPROLOL TARTRATE 25 MG PO TABS: 25.0000 mg | ORAL_TABLET | Freq: Two times a day (BID) | ORAL | 3 refills | Status: DC

## 2021-06-23 MED ORDER — TICAGRELOR 90 MG PO TABS: 90.0000 mg | ORAL_TABLET | Freq: Two times a day (BID) | ORAL | 3 refills | Status: DC

## 2021-06-23 MED ORDER — ATORVASTATIN CALCIUM 40 MG PO TABS: 40.0000 mg | ORAL_TABLET | Freq: Every day | ORAL | 3 refills | Status: DC

## 2021-06-23 NOTE — Addendum Note (Signed)
Addended by: Gerald Stabs on: 06/23/2021 04:51 PM   Modules accepted: Orders

## 2021-06-23 NOTE — Patient Instructions (Signed)
Medication Instructions:  Your Physician recommend you continue on your current medication as directed.    *If you need a refill on your cardiac medications before your next appointment, please call your pharmacy*   Follow-Up: At [facility], you and your health needs are our priority.  As part of our continuing mission to provide you with exceptional heart care, we have created designated Provider Care Teams.  These Care Teams include your primary Cardiologist (physician) and Advanced Practice Providers (APPs -  Physician Assistants and Nurse Practitioners) who all work together to provide you with the care you need, when you need it.  We recommend signing up for the patient portal called "MyChart".  Sign up information is provided on this After Visit Summary.  MyChart is used to connect with patients for Virtual Visits (Telemedicine).  Patients are able to view lab/test results, encounter notes, upcoming appointments, etc.  Non-urgent messages can be sent to your provider as well.   To learn more about what you can do with MyChart, go to [email or web address].    Your next appointment:   3 month(s)  The format for your next appointment:   In Person  Provider:   Shelva Majestic, MD  or Diona Browner, NP {

## 2021-06-23 NOTE — Progress Notes (Signed)
° °  Subjective:    Patient ID: Gabriel [name], male    DOB: [birth year], 88 y.o.   MRN: [id number]  HPI Here with is daughter to follow up on a hospital stay from 06-06-21 to 06-10-21 for a STEMI. He presented with chest pain and SOB. During the cardiac catheterization  He was found to have 2 stenoses in the LAD, one 90% and the other 99%. These were treated with 2 drug eluting stents. The RCA and CA were fairly clear. An ECHO revealed regional wall motion abnormalities in the LV with an EF of 40-45%. He was placed on a beta blocker, and his statin was continued. He was sent back to [facility] on ASA and Brilinta. Since then he has felt great, no chest pain or SOB at all. His BP is stable. Appetite and energy levels is good.   Review of Systems  Constitutional: Negative.   Respiratory: Negative.    Cardiovascular: Negative.   Neurological: Negative.       Objective:   Physical Exam Constitutional:      Appearance: Normal appearance.     Comments: Using a rolling walker   Cardiovascular:     Rate and Rhythm: Normal rate and regular rhythm.     Pulses: Normal pulses.     Heart sounds: Normal heart sounds.  Pulmonary:     Effort: Pulmonary effort is normal.     Breath sounds: Normal breath sounds.  Musculoskeletal:     Right lower leg: No edema.     Left lower leg: No edema.  Neurological:     General: No focal deficit present.     Mental Status: He is alert and oriented to person, place, and time. Mental status is at baseline.          Assessment & Plan:  He is recovering well from a recent STEMI. He will follow up with Cardiology this afternoon. We will see him in June for a well exam. We spent a total of (35   ) minutes reviewing records and discussing these issues.  Alysia Penna, MD

## 2021-06-24 DIAGNOSIS — R2681 Unsteadiness on feet: Secondary | ICD-10-CM | POA: Diagnosis not present

## 2021-06-24 DIAGNOSIS — R29898 Other symptoms and signs involving the musculoskeletal system: Secondary | ICD-10-CM | POA: Diagnosis not present

## 2021-06-24 DIAGNOSIS — M6281 Muscle weakness (generalized): Secondary | ICD-10-CM | POA: Diagnosis not present

## 2021-06-24 DIAGNOSIS — R4189 Other symptoms and signs involving cognitive functions and awareness: Secondary | ICD-10-CM | POA: Diagnosis not present

## 2021-06-24 DIAGNOSIS — R41841 Cognitive communication deficit: Secondary | ICD-10-CM | POA: Diagnosis not present

## 2021-06-24 DIAGNOSIS — I213 ST elevation (STEMI) myocardial infarction of unspecified site: Secondary | ICD-10-CM | POA: Diagnosis not present

## 2021-06-24 DIAGNOSIS — N3946 Mixed incontinence: Secondary | ICD-10-CM | POA: Diagnosis not present

## 2021-06-25 ENCOUNTER — Other Ambulatory Visit (HOSPITAL_COMMUNITY): Payer: Self-pay

## 2021-06-25 DIAGNOSIS — R29898 Other symptoms and signs involving the musculoskeletal system: Secondary | ICD-10-CM | POA: Diagnosis not present

## 2021-06-25 DIAGNOSIS — R41841 Cognitive communication deficit: Secondary | ICD-10-CM | POA: Diagnosis not present

## 2021-06-25 DIAGNOSIS — M6281 Muscle weakness (generalized): Secondary | ICD-10-CM | POA: Diagnosis not present

## 2021-06-25 DIAGNOSIS — N3946 Mixed incontinence: Secondary | ICD-10-CM | POA: Diagnosis not present

## 2021-06-25 DIAGNOSIS — I213 ST elevation (STEMI) myocardial infarction of unspecified site: Secondary | ICD-10-CM | POA: Diagnosis not present

## 2021-06-25 DIAGNOSIS — R4189 Other symptoms and signs involving cognitive functions and awareness: Secondary | ICD-10-CM | POA: Diagnosis not present

## 2021-06-29 ENCOUNTER — Telehealth: Payer: Self-pay | Admitting: Family Medicine

## 2021-06-29 DIAGNOSIS — M6281 Muscle weakness (generalized): Secondary | ICD-10-CM | POA: Diagnosis not present

## 2021-06-29 DIAGNOSIS — R4189 Other symptoms and signs involving cognitive functions and awareness: Secondary | ICD-10-CM | POA: Diagnosis not present

## 2021-06-29 DIAGNOSIS — N3946 Mixed incontinence: Secondary | ICD-10-CM | POA: Diagnosis not present

## 2021-06-29 DIAGNOSIS — R41841 Cognitive communication deficit: Secondary | ICD-10-CM | POA: Diagnosis not present

## 2021-06-29 DIAGNOSIS — R29898 Other symptoms and signs involving the musculoskeletal system: Secondary | ICD-10-CM | POA: Diagnosis not present

## 2021-06-29 DIAGNOSIS — I213 ST elevation (STEMI) myocardial infarction of unspecified site: Secondary | ICD-10-CM | POA: Diagnosis not present

## 2021-06-29 NOTE — Telephone Encounter (Signed)
Patient has had 2 nosebleeds and is on [place].  BP is 130/70. ?

## 2021-06-29 NOTE — Telephone Encounter (Signed)
Please advise 

## 2021-06-30 NOTE — Telephone Encounter (Signed)
Lm for Gabriel [name] on confidential vm message from Dr. Sarajane Jews. ?

## 2021-06-30 NOTE — Telephone Encounter (Signed)
Noted. Any decisions about changing the Brilinta will need to be done by Cardiology. In the meantime have the patient use saline nasal sprays in each nostril TID ?

## 2021-07-01 DIAGNOSIS — M6281 Muscle weakness (generalized): Secondary | ICD-10-CM | POA: Diagnosis not present

## 2021-07-01 DIAGNOSIS — R4189 Other symptoms and signs involving cognitive functions and awareness: Secondary | ICD-10-CM | POA: Diagnosis not present

## 2021-07-01 DIAGNOSIS — N3946 Mixed incontinence: Secondary | ICD-10-CM | POA: Diagnosis not present

## 2021-07-01 DIAGNOSIS — R41841 Cognitive communication deficit: Secondary | ICD-10-CM | POA: Diagnosis not present

## 2021-07-01 DIAGNOSIS — R29898 Other symptoms and signs involving the musculoskeletal system: Secondary | ICD-10-CM | POA: Diagnosis not present

## 2021-07-01 DIAGNOSIS — I213 ST elevation (STEMI) myocardial infarction of unspecified site: Secondary | ICD-10-CM | POA: Diagnosis not present

## 2021-07-02 ENCOUNTER — Telehealth: Payer: Self-pay | Admitting: Family Medicine

## 2021-07-02 DIAGNOSIS — R41841 Cognitive communication deficit: Secondary | ICD-10-CM | POA: Diagnosis not present

## 2021-07-02 DIAGNOSIS — R29898 Other symptoms and signs involving the musculoskeletal system: Secondary | ICD-10-CM | POA: Diagnosis not present

## 2021-07-02 DIAGNOSIS — N3946 Mixed incontinence: Secondary | ICD-10-CM | POA: Diagnosis not present

## 2021-07-02 DIAGNOSIS — I213 ST elevation (STEMI) myocardial infarction of unspecified site: Secondary | ICD-10-CM | POA: Diagnosis not present

## 2021-07-02 DIAGNOSIS — R4189 Other symptoms and signs involving cognitive functions and awareness: Secondary | ICD-10-CM | POA: Diagnosis not present

## 2021-07-02 DIAGNOSIS — M6281 Muscle weakness (generalized): Secondary | ICD-10-CM | POA: Diagnosis not present

## 2021-07-02 NOTE — Telephone Encounter (Signed)
Olga Millers (RN) with [facility] called in requesting medication lists for patient. ? ?Medication could be faxed to [phone]. ? ?Clarene Critchley could be contacted at [phone]. ? ?Please advise ?

## 2021-07-03 ENCOUNTER — Telehealth: Payer: Self-pay | Admitting: Family Medicine

## 2021-07-03 NOTE — Telephone Encounter (Signed)
Faxed pt medication list as requested, called Gabriel [name] to confirm but left a message to return my call at the office. ?

## 2021-07-03 NOTE — Telephone Encounter (Signed)
Faxed pt medication list as requested, called [facility] to confirm but left a message to return my call at the office. ?

## 2021-07-03 NOTE — Telephone Encounter (Signed)
Pt medication list was faxed to Silvio Clayman with [facility] ?

## 2021-07-03 NOTE — Telephone Encounter (Signed)
Gabriel [name] with [facility] is calling and would like medication list to be fax to them at [phone] ?

## 2021-07-03 NOTE — Telephone Encounter (Signed)
Pt medication list was faxed and a confirmation received ?

## 2021-07-06 DIAGNOSIS — M6281 Muscle weakness (generalized): Secondary | ICD-10-CM | POA: Diagnosis not present

## 2021-07-06 DIAGNOSIS — R4189 Other symptoms and signs involving cognitive functions and awareness: Secondary | ICD-10-CM | POA: Diagnosis not present

## 2021-07-06 DIAGNOSIS — N3946 Mixed incontinence: Secondary | ICD-10-CM | POA: Diagnosis not present

## 2021-07-06 DIAGNOSIS — I213 ST elevation (STEMI) myocardial infarction of unspecified site: Secondary | ICD-10-CM | POA: Diagnosis not present

## 2021-07-06 DIAGNOSIS — R41841 Cognitive communication deficit: Secondary | ICD-10-CM | POA: Diagnosis not present

## 2021-07-06 DIAGNOSIS — R29898 Other symptoms and signs involving the musculoskeletal system: Secondary | ICD-10-CM | POA: Diagnosis not present

## 2021-07-07 ENCOUNTER — Other Ambulatory Visit (HOSPITAL_COMMUNITY): Payer: Self-pay

## 2021-07-08 DIAGNOSIS — R41841 Cognitive communication deficit: Secondary | ICD-10-CM | POA: Diagnosis not present

## 2021-07-08 DIAGNOSIS — R29898 Other symptoms and signs involving the musculoskeletal system: Secondary | ICD-10-CM | POA: Diagnosis not present

## 2021-07-08 DIAGNOSIS — I213 ST elevation (STEMI) myocardial infarction of unspecified site: Secondary | ICD-10-CM | POA: Diagnosis not present

## 2021-07-08 DIAGNOSIS — R4189 Other symptoms and signs involving cognitive functions and awareness: Secondary | ICD-10-CM | POA: Diagnosis not present

## 2021-07-08 DIAGNOSIS — N3946 Mixed incontinence: Secondary | ICD-10-CM | POA: Diagnosis not present

## 2021-07-08 DIAGNOSIS — M6281 Muscle weakness (generalized): Secondary | ICD-10-CM | POA: Diagnosis not present

## 2021-07-09 DIAGNOSIS — N3946 Mixed incontinence: Secondary | ICD-10-CM | POA: Diagnosis not present

## 2021-07-09 DIAGNOSIS — I213 ST elevation (STEMI) myocardial infarction of unspecified site: Secondary | ICD-10-CM | POA: Diagnosis not present

## 2021-07-09 DIAGNOSIS — R4189 Other symptoms and signs involving cognitive functions and awareness: Secondary | ICD-10-CM | POA: Diagnosis not present

## 2021-07-09 DIAGNOSIS — R29898 Other symptoms and signs involving the musculoskeletal system: Secondary | ICD-10-CM | POA: Diagnosis not present

## 2021-07-09 DIAGNOSIS — M6281 Muscle weakness (generalized): Secondary | ICD-10-CM | POA: Diagnosis not present

## 2021-07-09 DIAGNOSIS — R41841 Cognitive communication deficit: Secondary | ICD-10-CM | POA: Diagnosis not present

## 2021-07-21 DIAGNOSIS — Z85828 Personal history of other malignant neoplasm of skin: Secondary | ICD-10-CM | POA: Diagnosis not present

## 2021-07-21 DIAGNOSIS — D1801 Hemangioma of skin and subcutaneous tissue: Secondary | ICD-10-CM | POA: Diagnosis not present

## 2021-07-21 DIAGNOSIS — L821 Other seborrheic keratosis: Secondary | ICD-10-CM | POA: Diagnosis not present

## 2021-07-21 DIAGNOSIS — L570 Actinic keratosis: Secondary | ICD-10-CM | POA: Diagnosis not present

## 2021-07-21 DIAGNOSIS — L579 Skin changes due to chronic exposure to nonionizing radiation, unspecified: Secondary | ICD-10-CM | POA: Diagnosis not present

## 2021-07-21 DIAGNOSIS — L814 Other melanin hyperpigmentation: Secondary | ICD-10-CM | POA: Diagnosis not present

## 2021-07-22 ENCOUNTER — Telehealth: Payer: Self-pay | Admitting: Family Medicine

## 2021-07-22 NOTE — Telephone Encounter (Signed)
Gabriel [name] with [facility] called over requesting the patient diagnosis to be faxed over. ? ?Karlene Einstein could be contacted at [phone] ext [phone] ? ?Fax [phone] ?

## 2021-07-23 NOTE — Telephone Encounter (Signed)
Pt Diagnosis list faxed to the number provided ?

## 2021-07-27 DIAGNOSIS — R2681 Unsteadiness on feet: Secondary | ICD-10-CM | POA: Diagnosis not present

## 2021-07-27 DIAGNOSIS — I213 ST elevation (STEMI) myocardial infarction of unspecified site: Secondary | ICD-10-CM | POA: Diagnosis not present

## 2021-07-27 DIAGNOSIS — R29898 Other symptoms and signs involving the musculoskeletal system: Secondary | ICD-10-CM | POA: Diagnosis not present

## 2021-07-27 DIAGNOSIS — R296 Repeated falls: Secondary | ICD-10-CM | POA: Diagnosis not present

## 2021-07-27 DIAGNOSIS — M6281 Muscle weakness (generalized): Secondary | ICD-10-CM | POA: Diagnosis not present

## 2021-07-27 DIAGNOSIS — N3946 Mixed incontinence: Secondary | ICD-10-CM | POA: Diagnosis not present

## 2021-07-29 DIAGNOSIS — I213 ST elevation (STEMI) myocardial infarction of unspecified site: Secondary | ICD-10-CM | POA: Diagnosis not present

## 2021-07-29 DIAGNOSIS — N3946 Mixed incontinence: Secondary | ICD-10-CM | POA: Diagnosis not present

## 2021-07-29 DIAGNOSIS — R296 Repeated falls: Secondary | ICD-10-CM | POA: Diagnosis not present

## 2021-07-29 DIAGNOSIS — R29898 Other symptoms and signs involving the musculoskeletal system: Secondary | ICD-10-CM | POA: Diagnosis not present

## 2021-07-29 DIAGNOSIS — R2681 Unsteadiness on feet: Secondary | ICD-10-CM | POA: Diagnosis not present

## 2021-07-29 DIAGNOSIS — M6281 Muscle weakness (generalized): Secondary | ICD-10-CM | POA: Diagnosis not present

## 2021-07-30 DIAGNOSIS — R2681 Unsteadiness on feet: Secondary | ICD-10-CM | POA: Diagnosis not present

## 2021-07-30 DIAGNOSIS — R29898 Other symptoms and signs involving the musculoskeletal system: Secondary | ICD-10-CM | POA: Diagnosis not present

## 2021-07-30 DIAGNOSIS — I213 ST elevation (STEMI) myocardial infarction of unspecified site: Secondary | ICD-10-CM | POA: Diagnosis not present

## 2021-07-30 DIAGNOSIS — N3946 Mixed incontinence: Secondary | ICD-10-CM | POA: Diagnosis not present

## 2021-07-30 DIAGNOSIS — M6281 Muscle weakness (generalized): Secondary | ICD-10-CM | POA: Diagnosis not present

## 2021-07-30 DIAGNOSIS — R296 Repeated falls: Secondary | ICD-10-CM | POA: Diagnosis not present

## 2021-08-03 DIAGNOSIS — R296 Repeated falls: Secondary | ICD-10-CM | POA: Diagnosis not present

## 2021-08-03 DIAGNOSIS — N3946 Mixed incontinence: Secondary | ICD-10-CM | POA: Diagnosis not present

## 2021-08-03 DIAGNOSIS — R2681 Unsteadiness on feet: Secondary | ICD-10-CM | POA: Diagnosis not present

## 2021-08-03 DIAGNOSIS — I213 ST elevation (STEMI) myocardial infarction of unspecified site: Secondary | ICD-10-CM | POA: Diagnosis not present

## 2021-08-03 DIAGNOSIS — M6281 Muscle weakness (generalized): Secondary | ICD-10-CM | POA: Diagnosis not present

## 2021-08-03 DIAGNOSIS — R29898 Other symptoms and signs involving the musculoskeletal system: Secondary | ICD-10-CM | POA: Diagnosis not present

## 2021-08-05 DIAGNOSIS — N3946 Mixed incontinence: Secondary | ICD-10-CM | POA: Diagnosis not present

## 2021-08-05 DIAGNOSIS — I213 ST elevation (STEMI) myocardial infarction of unspecified site: Secondary | ICD-10-CM | POA: Diagnosis not present

## 2021-08-05 DIAGNOSIS — R29898 Other symptoms and signs involving the musculoskeletal system: Secondary | ICD-10-CM | POA: Diagnosis not present

## 2021-08-05 DIAGNOSIS — R296 Repeated falls: Secondary | ICD-10-CM | POA: Diagnosis not present

## 2021-08-05 DIAGNOSIS — M6281 Muscle weakness (generalized): Secondary | ICD-10-CM | POA: Diagnosis not present

## 2021-08-05 DIAGNOSIS — R2681 Unsteadiness on feet: Secondary | ICD-10-CM | POA: Diagnosis not present

## 2021-08-06 DIAGNOSIS — I213 ST elevation (STEMI) myocardial infarction of unspecified site: Secondary | ICD-10-CM | POA: Diagnosis not present

## 2021-08-06 DIAGNOSIS — R296 Repeated falls: Secondary | ICD-10-CM | POA: Diagnosis not present

## 2021-08-06 DIAGNOSIS — N3946 Mixed incontinence: Secondary | ICD-10-CM | POA: Diagnosis not present

## 2021-08-06 DIAGNOSIS — R2681 Unsteadiness on feet: Secondary | ICD-10-CM | POA: Diagnosis not present

## 2021-08-06 DIAGNOSIS — M6281 Muscle weakness (generalized): Secondary | ICD-10-CM | POA: Diagnosis not present

## 2021-08-06 DIAGNOSIS — R29898 Other symptoms and signs involving the musculoskeletal system: Secondary | ICD-10-CM | POA: Diagnosis not present

## 2021-08-10 DIAGNOSIS — R2681 Unsteadiness on feet: Secondary | ICD-10-CM | POA: Diagnosis not present

## 2021-08-10 DIAGNOSIS — I213 ST elevation (STEMI) myocardial infarction of unspecified site: Secondary | ICD-10-CM | POA: Diagnosis not present

## 2021-08-10 DIAGNOSIS — N3946 Mixed incontinence: Secondary | ICD-10-CM | POA: Diagnosis not present

## 2021-08-10 DIAGNOSIS — R29898 Other symptoms and signs involving the musculoskeletal system: Secondary | ICD-10-CM | POA: Diagnosis not present

## 2021-08-10 DIAGNOSIS — M6281 Muscle weakness (generalized): Secondary | ICD-10-CM | POA: Diagnosis not present

## 2021-08-10 DIAGNOSIS — R296 Repeated falls: Secondary | ICD-10-CM | POA: Diagnosis not present

## 2021-08-11 DIAGNOSIS — L570 Actinic keratosis: Secondary | ICD-10-CM | POA: Diagnosis not present

## 2021-08-11 DIAGNOSIS — D1801 Hemangioma of skin and subcutaneous tissue: Secondary | ICD-10-CM | POA: Diagnosis not present

## 2021-08-11 DIAGNOSIS — L821 Other seborrheic keratosis: Secondary | ICD-10-CM | POA: Diagnosis not present

## 2021-08-11 DIAGNOSIS — Z85828 Personal history of other malignant neoplasm of skin: Secondary | ICD-10-CM | POA: Diagnosis not present

## 2021-08-11 DIAGNOSIS — D485 Neoplasm of uncertain behavior of skin: Secondary | ICD-10-CM | POA: Diagnosis not present

## 2021-08-11 DIAGNOSIS — L814 Other melanin hyperpigmentation: Secondary | ICD-10-CM | POA: Diagnosis not present

## 2021-08-12 DIAGNOSIS — I213 ST elevation (STEMI) myocardial infarction of unspecified site: Secondary | ICD-10-CM | POA: Diagnosis not present

## 2021-08-12 DIAGNOSIS — M6281 Muscle weakness (generalized): Secondary | ICD-10-CM | POA: Diagnosis not present

## 2021-08-12 DIAGNOSIS — R2681 Unsteadiness on feet: Secondary | ICD-10-CM | POA: Diagnosis not present

## 2021-08-12 DIAGNOSIS — N3946 Mixed incontinence: Secondary | ICD-10-CM | POA: Diagnosis not present

## 2021-08-12 DIAGNOSIS — R29898 Other symptoms and signs involving the musculoskeletal system: Secondary | ICD-10-CM | POA: Diagnosis not present

## 2021-08-12 DIAGNOSIS — R296 Repeated falls: Secondary | ICD-10-CM | POA: Diagnosis not present

## 2021-08-13 DIAGNOSIS — R2681 Unsteadiness on feet: Secondary | ICD-10-CM | POA: Diagnosis not present

## 2021-08-13 DIAGNOSIS — M6281 Muscle weakness (generalized): Secondary | ICD-10-CM | POA: Diagnosis not present

## 2021-08-13 DIAGNOSIS — N3946 Mixed incontinence: Secondary | ICD-10-CM | POA: Diagnosis not present

## 2021-08-13 DIAGNOSIS — R296 Repeated falls: Secondary | ICD-10-CM | POA: Diagnosis not present

## 2021-08-13 DIAGNOSIS — R29898 Other symptoms and signs involving the musculoskeletal system: Secondary | ICD-10-CM | POA: Diagnosis not present

## 2021-08-13 DIAGNOSIS — I213 ST elevation (STEMI) myocardial infarction of unspecified site: Secondary | ICD-10-CM | POA: Diagnosis not present

## 2021-08-14 ENCOUNTER — Other Ambulatory Visit (HOSPITAL_COMMUNITY): Payer: Self-pay

## 2021-08-17 DIAGNOSIS — M6281 Muscle weakness (generalized): Secondary | ICD-10-CM | POA: Diagnosis not present

## 2021-08-17 DIAGNOSIS — N3946 Mixed incontinence: Secondary | ICD-10-CM | POA: Diagnosis not present

## 2021-08-17 DIAGNOSIS — R296 Repeated falls: Secondary | ICD-10-CM | POA: Diagnosis not present

## 2021-08-17 DIAGNOSIS — I213 ST elevation (STEMI) myocardial infarction of unspecified site: Secondary | ICD-10-CM | POA: Diagnosis not present

## 2021-08-17 DIAGNOSIS — R2681 Unsteadiness on feet: Secondary | ICD-10-CM | POA: Diagnosis not present

## 2021-08-17 DIAGNOSIS — R29898 Other symptoms and signs involving the musculoskeletal system: Secondary | ICD-10-CM | POA: Diagnosis not present

## 2021-08-19 DIAGNOSIS — M6281 Muscle weakness (generalized): Secondary | ICD-10-CM | POA: Diagnosis not present

## 2021-08-19 DIAGNOSIS — N3946 Mixed incontinence: Secondary | ICD-10-CM | POA: Diagnosis not present

## 2021-08-19 DIAGNOSIS — R29898 Other symptoms and signs involving the musculoskeletal system: Secondary | ICD-10-CM | POA: Diagnosis not present

## 2021-08-19 DIAGNOSIS — I213 ST elevation (STEMI) myocardial infarction of unspecified site: Secondary | ICD-10-CM | POA: Diagnosis not present

## 2021-08-19 DIAGNOSIS — R2681 Unsteadiness on feet: Secondary | ICD-10-CM | POA: Diagnosis not present

## 2021-08-19 DIAGNOSIS — R296 Repeated falls: Secondary | ICD-10-CM | POA: Diagnosis not present

## 2021-08-20 DIAGNOSIS — M6281 Muscle weakness (generalized): Secondary | ICD-10-CM | POA: Diagnosis not present

## 2021-08-20 DIAGNOSIS — R296 Repeated falls: Secondary | ICD-10-CM | POA: Diagnosis not present

## 2021-08-20 DIAGNOSIS — R29898 Other symptoms and signs involving the musculoskeletal system: Secondary | ICD-10-CM | POA: Diagnosis not present

## 2021-08-20 DIAGNOSIS — N3946 Mixed incontinence: Secondary | ICD-10-CM | POA: Diagnosis not present

## 2021-08-20 DIAGNOSIS — R2681 Unsteadiness on feet: Secondary | ICD-10-CM | POA: Diagnosis not present

## 2021-08-20 DIAGNOSIS — I213 ST elevation (STEMI) myocardial infarction of unspecified site: Secondary | ICD-10-CM | POA: Diagnosis not present

## 2021-08-24 DIAGNOSIS — M6281 Muscle weakness (generalized): Secondary | ICD-10-CM | POA: Diagnosis not present

## 2021-08-24 DIAGNOSIS — R296 Repeated falls: Secondary | ICD-10-CM | POA: Diagnosis not present

## 2021-08-24 DIAGNOSIS — N3946 Mixed incontinence: Secondary | ICD-10-CM | POA: Diagnosis not present

## 2021-08-24 DIAGNOSIS — R2681 Unsteadiness on feet: Secondary | ICD-10-CM | POA: Diagnosis not present

## 2021-08-24 DIAGNOSIS — I213 ST elevation (STEMI) myocardial infarction of unspecified site: Secondary | ICD-10-CM | POA: Diagnosis not present

## 2021-08-24 DIAGNOSIS — R29898 Other symptoms and signs involving the musculoskeletal system: Secondary | ICD-10-CM | POA: Diagnosis not present

## 2021-08-26 DIAGNOSIS — N3946 Mixed incontinence: Secondary | ICD-10-CM | POA: Diagnosis not present

## 2021-08-26 DIAGNOSIS — I213 ST elevation (STEMI) myocardial infarction of unspecified site: Secondary | ICD-10-CM | POA: Diagnosis not present

## 2021-08-26 DIAGNOSIS — R29898 Other symptoms and signs involving the musculoskeletal system: Secondary | ICD-10-CM | POA: Diagnosis not present

## 2021-08-26 DIAGNOSIS — M6281 Muscle weakness (generalized): Secondary | ICD-10-CM | POA: Diagnosis not present

## 2021-08-26 DIAGNOSIS — R296 Repeated falls: Secondary | ICD-10-CM | POA: Diagnosis not present

## 2021-08-26 DIAGNOSIS — R2681 Unsteadiness on feet: Secondary | ICD-10-CM | POA: Diagnosis not present

## 2021-08-28 DIAGNOSIS — R29898 Other symptoms and signs involving the musculoskeletal system: Secondary | ICD-10-CM | POA: Diagnosis not present

## 2021-08-28 DIAGNOSIS — R296 Repeated falls: Secondary | ICD-10-CM | POA: Diagnosis not present

## 2021-08-28 DIAGNOSIS — I213 ST elevation (STEMI) myocardial infarction of unspecified site: Secondary | ICD-10-CM | POA: Diagnosis not present

## 2021-08-28 DIAGNOSIS — R2681 Unsteadiness on feet: Secondary | ICD-10-CM | POA: Diagnosis not present

## 2021-08-28 DIAGNOSIS — N3946 Mixed incontinence: Secondary | ICD-10-CM | POA: Diagnosis not present

## 2021-08-28 DIAGNOSIS — M6281 Muscle weakness (generalized): Secondary | ICD-10-CM | POA: Diagnosis not present

## 2021-08-31 DIAGNOSIS — R2681 Unsteadiness on feet: Secondary | ICD-10-CM | POA: Diagnosis not present

## 2021-08-31 DIAGNOSIS — R296 Repeated falls: Secondary | ICD-10-CM | POA: Diagnosis not present

## 2021-08-31 DIAGNOSIS — N3946 Mixed incontinence: Secondary | ICD-10-CM | POA: Diagnosis not present

## 2021-08-31 DIAGNOSIS — M6281 Muscle weakness (generalized): Secondary | ICD-10-CM | POA: Diagnosis not present

## 2021-08-31 DIAGNOSIS — R29898 Other symptoms and signs involving the musculoskeletal system: Secondary | ICD-10-CM | POA: Diagnosis not present

## 2021-08-31 DIAGNOSIS — I213 ST elevation (STEMI) myocardial infarction of unspecified site: Secondary | ICD-10-CM | POA: Diagnosis not present

## 2021-09-01 DIAGNOSIS — C44619 Basal cell carcinoma of skin of left upper limb, including shoulder: Secondary | ICD-10-CM | POA: Diagnosis not present

## 2021-09-01 DIAGNOSIS — D485 Neoplasm of uncertain behavior of skin: Secondary | ICD-10-CM | POA: Diagnosis not present

## 2021-09-01 DIAGNOSIS — L814 Other melanin hyperpigmentation: Secondary | ICD-10-CM | POA: Diagnosis not present

## 2021-09-01 DIAGNOSIS — L821 Other seborrheic keratosis: Secondary | ICD-10-CM | POA: Diagnosis not present

## 2021-09-01 DIAGNOSIS — Z85828 Personal history of other malignant neoplasm of skin: Secondary | ICD-10-CM | POA: Diagnosis not present

## 2021-09-01 DIAGNOSIS — C4442 Squamous cell carcinoma of skin of scalp and neck: Secondary | ICD-10-CM | POA: Diagnosis not present

## 2021-09-01 DIAGNOSIS — L570 Actinic keratosis: Secondary | ICD-10-CM | POA: Diagnosis not present

## 2021-09-01 NOTE — Progress Notes (Signed)
? ? ?Office Visit  ?  ?Patient Name: Gabriel [name] ?Date of Encounter: 09/02/2021 ? ?Primary Care Provider:  Laurey Morale, MD ?Primary Cardiologist:  Shelva Majestic, MD ? ?Chief Complaint  ?  ?88 year old male with a history of CAD s/p STEMI, s/p DES-m-dLAD, chronic systolic heart failure, ischemic cardiomyopathy, paroxysmal atrial fibrillation, hypertension, hyperlipidemia, GERD and arthritis who presents for follow-up related to CAD. ? ?Past Medical History  ?  ?Past Medical History:  ?Diagnosis Date  ? Arthritis   ? Diverticulosis of colon   ? External thrombosed hemorrhoids 06/11/2009  ? Qualifier: Diagnosis of  By: Sarajane Jews MD, Ishmael Holter   ? GERD (gastroesophageal reflux disease)   ? Hypertension   ? ?Past Surgical History:  ?Procedure Laterality Date  ? cataract extaction, right  6/09  ? per Dr. [name]  ? CATARACT EXTRACTION Left   ? COLONOSCOPY  09/23/04  ? per Dr. [name], clear, no repeats needed   ? CORONARY STENT INTERVENTION N/A 06/06/2021  ? Procedure: CORONARY STENT INTERVENTION;  Surgeon: [name], MD;  Location: [facility];  Service: Cardiovascular;  Laterality: N/A;  ? CORONARY/GRAFT ACUTE MI REVASCULARIZATION N/A 06/06/2021  ? Procedure: Coronary/Graft Acute MI Revascularization;  Surgeon: [name], MD;  Location: [facility];  Service: Cardiovascular;  Laterality: N/A;  ? EGD and esophageal diatation,  05-10-11  ? per Dr. [name]    ? HERNIA REPAIR    ? LEFT HEART CATH AND CORONARY ANGIOGRAPHY N/A 06/06/2021  ? Procedure: LEFT HEART CATH AND CORONARY ANGIOGRAPHY;  Surgeon: [name], MD;  Location: [facility];  Service: Cardiovascular;  Laterality: N/A;  ? rt inguinal hernia    ? x2  ? ? ?Allergies ? ?Allergies  ?Allergen Reactions  ? Contrast Media [Iodinated Contrast Media] Rash  ? ? ?History of Present Illness  ?  ?88 year old male with the above past medical history including CAD s/p STEMI, s/p DES-m-dLAD, chronic systolic heart failure, ischemic  cardiomyopathy, paroxysmal atrial fibrillation, hypertension, hyperlipidemia, GERD and arthritis. ?  ?Patient was hospitalized in February 2023 in the setting of STEMI. He underwent emergent cardiac catheterization which revealed single-vessel CAD with thrombotic 90% mid LAD stenosis and 99% subtotal occlusion of the distal LAD, otherwise mild nonobstructive CAD in the left circumflex. RCA was ectatic without significant stenosis.  He underwent successful PCI to mid and distal LAD using nonoverlapping DES (3.0 x 12 mm (mid LAD) and 2.0 x 15 mm (distal LAD) drug-eluting stents).  He was started on DAPT with aspirin and Brilinta.  Echocardiogram showed EF 40 to 45%, apical anterior wall with hypokinesis.  Initial EKG on presentation was concerning for new onset atrial fibrillation.  Repeat EKG more consistent with sinus bradycardia, with occasional junctional beats. Given advanced age, and need for DAPT, DOAC was not started as he was not considered an ideal candidate for triple therapy. He had no further episodes of atrial fibrillation during his hospitalization. He was last seen in the office on 06/23/2021 and was stable from a cardiac standpoint.  He denied symptoms concerning for angina.   ? ?He presents today for follow-up accompanied by his daughter. Since his last visit he has done well from a cardiac standpoint. He denies symptoms concerning for angina, denies palpitations, dyspnea, edema, weight gain, pnd, orthopnea. Overall, he reports feeling well and denies any new concerns today.  ? ?Home Medications  ?  ?Current Outpatient Medications  ?Medication Sig Dispense Refill  ? aspirin 81 MG tablet Take 81  mg by mouth daily.    ? atorvastatin (LIPITOR) 40 MG tablet Take 1 tablet (40 mg total) by mouth daily. 90 tablet 3  ? gabapentin (NEURONTIN) 300 MG capsule TAKE 1 CAPSULE BY MOUTH THREE TIMES A DAY (Patient taking differently: Take 300 mg by mouth 3 (three) times daily.) 270 capsule 1  ? metoprolol tartrate  (LOPRESSOR) 25 MG tablet Take 1 tablet (25 mg total) by mouth 2 (two) times daily. 180 tablet 3  ? Multiple Vitamin (MULTIVITAMIN WITH MINERALS) TABS tablet Take 1 tablet by mouth every morning.    ? nitroGLYCERIN (NITROSTAT) 0.4 MG SL tablet Place 1 tablet (0.4 mg total) under the tongue every 5 (five) minutes as needed for chest pain. 25 tablet 3  ? pantoprazole (PROTONIX) 40 MG tablet TAKE 1 TABLET BY MOUTH EVERY DAY (Patient taking differently: Take 40 mg by mouth daily.) 90 tablet 3  ? potassium chloride (KLOR-CON 10) 10 MEQ tablet Take 1 tablet (10 mEq total) by mouth daily. 30 tablet 0  ? ticagrelor (BRILINTA) 90 MG TABS tablet Take 1 tablet (90 mg total) by mouth 2 (two) times daily. 180 tablet 3  ? vitamin C (ASCORBIC ACID) 500 MG tablet Take 500 mg by mouth daily. Reported on 10/27/2015    ? ?No current facility-administered medications for this visit.  ?  ? ?Review of Systems  ?  ?He denies chest pain, palpitations, dyspnea, pnd, orthopnea, n, v, dizziness, syncope, edema, weight gain, or early satiety. All other systems reviewed and are otherwise negative except as noted above.  ? ?Physical Exam  ?  ?VS:  BP 122/80   Pulse (!) 56   Ht '5\' 7"'$  (1.702 m)   Wt 156 lb 12.8 oz (71.1 kg)   SpO2 98%   BMI 24.56 kg/m?  ?GEN: Well nourished, well developed, in no acute distress. ?HEENT: normal. ?Neck: Supple, no JVD, carotid bruits, or masses. ?Cardiac: RRR, no murmurs, rubs, or gallops. No clubbing, cyanosis, edema.  Radials/DP/PT 2+ and equal bilaterally.  ?Respiratory:  Respirations regular and unlabored, clear to auscultation bilaterally. ?GI: Soft, nontender, nondistended, BS + x 4. ?MS: no deformity or atrophy. ?Skin: warm and dry, no rash. ?Neuro:  Strength and sensation are intact. ?Psych: Normal affect. ? ?Accessory Clinical Findings  ?  ?ECG personally reviewed by me today - No EKG in office today - no acute changes. ? ?Lab Results  ?Component Value Date  ? WBC 10.5 06/10/2021  ? HGB 13.4 06/10/2021  ?  HCT 37.0 (L) 06/10/2021  ? MCV 89.2 06/10/2021  ? PLT 270 06/10/2021  ? ?Lab Results  ?Component Value Date  ? CREATININE 0.70 06/10/2021  ? BUN 13 06/10/2021  ? NA 132 (L) 06/10/2021  ? K 3.8 06/10/2021  ? CL 102 06/10/2021  ? CO2 21 (L) 06/10/2021  ? ?Lab Results  ?Component Value Date  ? ALT 30 06/06/2021  ? AST 47 (H) 06/06/2021  ? ALKPHOS 95 06/06/2021  ? BILITOT 1.6 (H) 06/06/2021  ? ?Lab Results  ?Component Value Date  ? CHOL 151 06/06/2021  ? HDL 44 06/06/2021  ? Murphy 77 06/06/2021  ? LDLDIRECT 72.0 09/27/2017  ? TRIG 151 (H) 06/06/2021  ? CHOLHDL 3.4 06/06/2021  ?  ?Lab Results  ?Component Value Date  ? HGBA1C 5.0 06/06/2021  ? ? ?Assessment & Plan  ?  ?1. CAD: S/p STEMI, DES x2 (mLAD and dLAD). Cath showed otherwise mild nonobstructive CAD in the left circumflex. RCA was ectatic without significant stenosis. Echo showed  EF 40 to 45%, apical anterior wall with hypokinesis. Stable with no anginal symptoms. Continue aspirin, brilinta, metoprolol, and Lipitor. ?  ?2. Chronic systolic heart failure/ischemic cardiomyopathy: Most recent echo as above. Euvolemic and well compensated on exam. Will repeat echo to reassess LV function post MI. If EF remains reduced, can consider possible escalation of GDMT at that time. ? ?3. Paroxysmal atrial fibrillation: This was a questionable diagnosis during recent admission, ultimately thought to be sinus bradycardia with occasional junctional beats. No documented recurrence. He denies any palpitations, dizziness, dyspnea. Should he have symptoms in the future could consider outpatient cardiac monitor. He is not an ideal candidate for triple therapy as he is on aspirin and Brilinta given recent MI and stenting and in setting of advanced age, therefore, no DOAC at this time. Continue metoprolol. ?  ?4. Hypertension: BP elevated upon arrival. Recheck BP at goal.  Continue to monitor BP and report BP consistently > 130-140/80. Continue current antihypertensive regimen.  ? ?5.  Hyperlipidemia: LDL was 77 in 05/2021. Continue atorvastatin.  ?  ?6. Disposition: Follow-up in 3-4 months with Dr. Claiborne Billings.  ?  ?Lenna Sciara, NP ?09/02/2021, 2:15 PM ?  ?

## 2021-09-02 ENCOUNTER — Ambulatory Visit (INDEPENDENT_AMBULATORY_CARE_PROVIDER_SITE_OTHER): Payer: Medicare Other | Admitting: Nurse Practitioner

## 2021-09-02 ENCOUNTER — Encounter: Payer: Self-pay | Admitting: Nurse Practitioner

## 2021-09-02 VITALS — BP 122/80 | HR 56 | Ht 67.0 in | Wt 156.8 lb

## 2021-09-02 DIAGNOSIS — I255 Ischemic cardiomyopathy: Secondary | ICD-10-CM

## 2021-09-02 DIAGNOSIS — I480 Paroxysmal atrial fibrillation: Secondary | ICD-10-CM

## 2021-09-02 DIAGNOSIS — I2510 Atherosclerotic heart disease of native coronary artery without angina pectoris: Secondary | ICD-10-CM

## 2021-09-02 DIAGNOSIS — I10 Essential (primary) hypertension: Secondary | ICD-10-CM

## 2021-09-02 DIAGNOSIS — I5022 Chronic systolic (congestive) heart failure: Secondary | ICD-10-CM

## 2021-09-02 DIAGNOSIS — E785 Hyperlipidemia, unspecified: Secondary | ICD-10-CM | POA: Diagnosis not present

## 2021-09-02 NOTE — Patient Instructions (Addendum)
Medication Instructions:  ?Your physician recommends that you continue on your current medications as directed. Please refer to the Current Medication list given to you today.  ? ?*If you need a refill on your cardiac medications before your next appointment, please call your pharmacy* ? ? ?Lab Work: ?NONE ordered at this time of appointment  ? ? ?If you have labs (blood work) drawn today and your tests are completely normal, you will receive your results only by: ?MyChart Message (if you have MyChart) OR ?A paper copy in the mail ?If you have any lab test that is abnormal or we need to change your treatment, we will call you to review the results. ? ? ?Testing/Procedures: ?Your physician has requested that you have an echocardiogram. Echocardiography is a painless test that uses sound waves to create images of your heart. It provides your doctor with information about the size and shape of your heart and how well your heart?s chambers and valves are working. This procedure takes approximately one hour. There are no restrictions for this procedure.  ? ? ? ?Follow-Up: ?At [facility], you and your health needs are our priority.  As part of our continuing mission to provide you with exceptional heart care, we have created designated Provider Care Teams.  These Care Teams include your primary Cardiologist (physician) and Advanced Practice Providers (APPs -  Physician Assistants and Nurse Practitioners) who all work together to provide you with the care you need, when you need it. ? ?We recommend signing up for the patient portal called "MyChart".  Sign up information is provided on this After Visit Summary.  MyChart is used to connect with patients for Virtual Visits (Telemedicine).  Patients are able to view lab/test results, encounter notes, upcoming appointments, etc.  Non-urgent messages can be sent to your provider as well.   ?To learn more about what you can do with MyChart, go to [email or web address].    ? ?Your next appointment:   ?3-4 month(s) ? ?The format for your next appointment:   ?In Person ? ?Provider:   ?Shelva Majestic, MD        ? ? ?Other Instructions ?Monitor Blood Pressure. Report Blood Pressure consistently greater than 130/80 ? ?Important Information About Sugar ? ? ? ? ? ? ?

## 2021-09-03 DIAGNOSIS — M6281 Muscle weakness (generalized): Secondary | ICD-10-CM | POA: Diagnosis not present

## 2021-09-03 DIAGNOSIS — R29898 Other symptoms and signs involving the musculoskeletal system: Secondary | ICD-10-CM | POA: Diagnosis not present

## 2021-09-03 DIAGNOSIS — N3946 Mixed incontinence: Secondary | ICD-10-CM | POA: Diagnosis not present

## 2021-09-03 DIAGNOSIS — I213 ST elevation (STEMI) myocardial infarction of unspecified site: Secondary | ICD-10-CM | POA: Diagnosis not present

## 2021-09-03 DIAGNOSIS — R2681 Unsteadiness on feet: Secondary | ICD-10-CM | POA: Diagnosis not present

## 2021-09-03 DIAGNOSIS — R296 Repeated falls: Secondary | ICD-10-CM | POA: Diagnosis not present

## 2021-09-04 DIAGNOSIS — R2681 Unsteadiness on feet: Secondary | ICD-10-CM | POA: Diagnosis not present

## 2021-09-04 DIAGNOSIS — I213 ST elevation (STEMI) myocardial infarction of unspecified site: Secondary | ICD-10-CM | POA: Diagnosis not present

## 2021-09-04 DIAGNOSIS — M6281 Muscle weakness (generalized): Secondary | ICD-10-CM | POA: Diagnosis not present

## 2021-09-04 DIAGNOSIS — R296 Repeated falls: Secondary | ICD-10-CM | POA: Diagnosis not present

## 2021-09-04 DIAGNOSIS — N3946 Mixed incontinence: Secondary | ICD-10-CM | POA: Diagnosis not present

## 2021-09-04 DIAGNOSIS — R29898 Other symptoms and signs involving the musculoskeletal system: Secondary | ICD-10-CM | POA: Diagnosis not present

## 2021-09-07 DIAGNOSIS — M6281 Muscle weakness (generalized): Secondary | ICD-10-CM | POA: Diagnosis not present

## 2021-09-07 DIAGNOSIS — I213 ST elevation (STEMI) myocardial infarction of unspecified site: Secondary | ICD-10-CM | POA: Diagnosis not present

## 2021-09-07 DIAGNOSIS — R2681 Unsteadiness on feet: Secondary | ICD-10-CM | POA: Diagnosis not present

## 2021-09-07 DIAGNOSIS — R29898 Other symptoms and signs involving the musculoskeletal system: Secondary | ICD-10-CM | POA: Diagnosis not present

## 2021-09-07 DIAGNOSIS — R296 Repeated falls: Secondary | ICD-10-CM | POA: Diagnosis not present

## 2021-09-07 DIAGNOSIS — N3946 Mixed incontinence: Secondary | ICD-10-CM | POA: Diagnosis not present

## 2021-09-09 DIAGNOSIS — R296 Repeated falls: Secondary | ICD-10-CM | POA: Diagnosis not present

## 2021-09-09 DIAGNOSIS — R29898 Other symptoms and signs involving the musculoskeletal system: Secondary | ICD-10-CM | POA: Diagnosis not present

## 2021-09-09 DIAGNOSIS — N3946 Mixed incontinence: Secondary | ICD-10-CM | POA: Diagnosis not present

## 2021-09-09 DIAGNOSIS — I213 ST elevation (STEMI) myocardial infarction of unspecified site: Secondary | ICD-10-CM | POA: Diagnosis not present

## 2021-09-09 DIAGNOSIS — R2681 Unsteadiness on feet: Secondary | ICD-10-CM | POA: Diagnosis not present

## 2021-09-09 DIAGNOSIS — M6281 Muscle weakness (generalized): Secondary | ICD-10-CM | POA: Diagnosis not present

## 2021-09-15 DIAGNOSIS — C4442 Squamous cell carcinoma of skin of scalp and neck: Secondary | ICD-10-CM | POA: Diagnosis not present

## 2021-09-15 DIAGNOSIS — L570 Actinic keratosis: Secondary | ICD-10-CM | POA: Diagnosis not present

## 2021-09-15 DIAGNOSIS — Z85828 Personal history of other malignant neoplasm of skin: Secondary | ICD-10-CM | POA: Diagnosis not present

## 2021-09-15 DIAGNOSIS — C44619 Basal cell carcinoma of skin of left upper limb, including shoulder: Secondary | ICD-10-CM | POA: Diagnosis not present

## 2021-09-15 DIAGNOSIS — L821 Other seborrheic keratosis: Secondary | ICD-10-CM | POA: Diagnosis not present

## 2021-09-22 ENCOUNTER — Other Ambulatory Visit: Payer: Self-pay

## 2021-09-22 DIAGNOSIS — K219 Gastro-esophageal reflux disease without esophagitis: Secondary | ICD-10-CM

## 2021-09-22 MED ORDER — PANTOPRAZOLE SODIUM 40 MG PO TBEC: 40.0000 mg | DELAYED_RELEASE_TABLET | Freq: Every day | ORAL | 0 refills | Status: DC

## 2021-09-23 ENCOUNTER — Ambulatory Visit (HOSPITAL_COMMUNITY): Payer: Medicare Other | Attending: Cardiology

## 2021-09-23 DIAGNOSIS — I5022 Chronic systolic (congestive) heart failure: Secondary | ICD-10-CM | POA: Diagnosis not present

## 2021-09-23 DIAGNOSIS — I2510 Atherosclerotic heart disease of native coronary artery without angina pectoris: Secondary | ICD-10-CM | POA: Insufficient documentation

## 2021-09-23 LAB — ECHOCARDIOGRAM COMPLETE
Area-P 1/2: 2.68 cm2
S' Lateral: 1.7 cm

## 2021-09-25 ENCOUNTER — Telehealth: Payer: Self-pay

## 2021-09-25 NOTE — Telephone Encounter (Signed)
Spoke with pt. Pt was notified of echocardiogram results. Pt will continue his current medications and follow up as planned.

## 2021-10-06 DIAGNOSIS — C44619 Basal cell carcinoma of skin of left upper limb, including shoulder: Secondary | ICD-10-CM | POA: Diagnosis not present

## 2021-10-14 ENCOUNTER — Ambulatory Visit (INDEPENDENT_AMBULATORY_CARE_PROVIDER_SITE_OTHER): Payer: Medicare Other | Admitting: Family Medicine

## 2021-10-14 ENCOUNTER — Encounter: Payer: Self-pay | Admitting: Family Medicine

## 2021-10-14 VITALS — BP 120/68 | HR 84 | Temp 98.4°F | Ht 67.0 in | Wt 156.6 lb

## 2021-10-14 DIAGNOSIS — I255 Ischemic cardiomyopathy: Secondary | ICD-10-CM

## 2021-10-14 DIAGNOSIS — I2102 ST elevation (STEMI) myocardial infarction involving left anterior descending coronary artery: Secondary | ICD-10-CM | POA: Diagnosis not present

## 2021-10-14 DIAGNOSIS — I10 Essential (primary) hypertension: Secondary | ICD-10-CM | POA: Diagnosis not present

## 2021-10-14 DIAGNOSIS — R739 Hyperglycemia, unspecified: Secondary | ICD-10-CM

## 2021-10-14 DIAGNOSIS — G8929 Other chronic pain: Secondary | ICD-10-CM

## 2021-10-14 DIAGNOSIS — N401 Enlarged prostate with lower urinary tract symptoms: Secondary | ICD-10-CM | POA: Diagnosis not present

## 2021-10-14 DIAGNOSIS — E785 Hyperlipidemia, unspecified: Secondary | ICD-10-CM | POA: Diagnosis not present

## 2021-10-14 DIAGNOSIS — N138 Other obstructive and reflux uropathy: Secondary | ICD-10-CM | POA: Diagnosis not present

## 2021-10-14 DIAGNOSIS — G609 Hereditary and idiopathic neuropathy, unspecified: Secondary | ICD-10-CM

## 2021-10-14 DIAGNOSIS — K219 Gastro-esophageal reflux disease without esophagitis: Secondary | ICD-10-CM | POA: Diagnosis not present

## 2021-10-14 DIAGNOSIS — M25561 Pain in right knee: Secondary | ICD-10-CM | POA: Diagnosis not present

## 2021-10-14 DIAGNOSIS — M159 Polyosteoarthritis, unspecified: Secondary | ICD-10-CM | POA: Diagnosis not present

## 2021-10-14 DIAGNOSIS — I480 Paroxysmal atrial fibrillation: Secondary | ICD-10-CM | POA: Diagnosis not present

## 2021-10-14 DIAGNOSIS — R1314 Dysphagia, pharyngoesophageal phase: Secondary | ICD-10-CM

## 2021-10-14 LAB — CBC WITH DIFFERENTIAL/PLATELET
Basophils Absolute: 0.1 10*3/uL (ref 0.0–0.1)
Basophils Relative: 0.9 % (ref 0.0–3.0)
Eosinophils Absolute: 0.3 10*3/uL (ref 0.0–0.7)
Eosinophils Relative: 3.4 % (ref 0.0–5.0)
HCT: 43.4 % (ref 39.0–52.0)
Hemoglobin: 14.8 g/dL (ref 13.0–17.0)
Lymphocytes Relative: 22.7 % (ref 12.0–46.0)
Lymphs Abs: 2 10*3/uL (ref 0.7–4.0)
MCHC: 34 g/dL (ref 30.0–36.0)
MCV: 92.9 fl (ref 78.0–100.0)
Monocytes Absolute: 0.7 10*3/uL (ref 0.1–1.0)
Monocytes Relative: 8.1 % (ref 3.0–12.0)
Neutro Abs: 5.8 10*3/uL (ref 1.4–7.7)
Neutrophils Relative %: 64.9 % (ref 43.0–77.0)
Platelets: 283 10*3/uL (ref 150.0–400.0)
RBC: 4.67 Mil/uL (ref 4.22–5.81)
RDW: 14.6 % (ref 11.5–15.5)
WBC: 9 10*3/uL (ref 4.0–10.5)

## 2021-10-14 LAB — HEPATIC FUNCTION PANEL
ALT: 36 U/L (ref 0–53)
AST: 40 U/L — ABNORMAL HIGH (ref 0–37)
Albumin: 3.9 g/dL (ref 3.5–5.2)
Alkaline Phosphatase: 126 U/L — ABNORMAL HIGH (ref 39–117)
Bilirubin, Direct: 0.4 mg/dL — ABNORMAL HIGH (ref 0.0–0.3)
Total Bilirubin: 1.8 mg/dL — ABNORMAL HIGH (ref 0.2–1.2)
Total Protein: 6.9 g/dL (ref 6.0–8.3)

## 2021-10-14 LAB — BASIC METABOLIC PANEL
BUN: 12 mg/dL (ref 6–23)
CO2: 27 mEq/L (ref 19–32)
Calcium: 9.5 mg/dL (ref 8.4–10.5)
Chloride: 101 mEq/L (ref 96–112)
Creatinine, Ser: 0.58 mg/dL (ref 0.40–1.50)
GFR: 83.18 mL/min (ref >60.00)
Glucose, Bld: 102 mg/dL — ABNORMAL HIGH (ref 70–99)
Potassium: 4.6 mEq/L (ref 3.5–5.1)
Sodium: 134 mEq/L — ABNORMAL LOW (ref 135–145)

## 2021-10-14 LAB — LIPID PANEL
Cholesterol: 93 mg/dL (ref 0–200)
HDL: 45.6 mg/dL (ref >39.00)
LDL Cholesterol: 33 mg/dL (ref 0–99)
NonHDL: 47.08
Total CHOL/HDL Ratio: 2
Triglycerides: 71 mg/dL (ref 0.0–149.0)
VLDL: 14.2 mg/dL (ref 0.0–40.0)

## 2021-10-14 LAB — HEMOGLOBIN A1C: Hgb A1c MFr Bld: 5.5 % (ref 4.6–6.5)

## 2021-10-14 LAB — TSH: TSH: 3.81 u[IU]/mL (ref 0.35–5.50)

## 2021-10-14 NOTE — Progress Notes (Signed)
Subjective:    Patient ID: Gabriel [name], male    DOB: August 19, 1924, 88 y.o.   MRN: [id number]  HPI Here with his daughter [name] to follow up on issues. His main complaint today is pain in the right knee. This greatly increased about a month ago. No recent trauma. He has known OA. He can walk short distances with a walker, but he prefers a wheelchair. No locking or giving way. Otherwise his BP has been stable. He has no trouble swallowing and his appetite is good. His GERD is well controlled. He sees Cardiology several times a year.    Review of Systems  Constitutional: Negative.   HENT: Negative.    Eyes: Negative.   Respiratory: Negative.    Cardiovascular: Negative.   Gastrointestinal: Negative.   Genitourinary: Negative.   Musculoskeletal:  Positive for arthralgias.  Skin: Negative.   Neurological: Negative.   Psychiatric/Behavioral: Negative.         Objective:   Physical Exam Constitutional:      General: He is not in acute distress.    Appearance: Normal appearance. He is well-developed. He is not diaphoretic.  HENT:     Head: Normocephalic and atraumatic.     Right Ear: External ear normal.     Left Ear: External ear normal.     Nose: Nose normal.     Mouth/Throat:     Pharynx: No oropharyngeal exudate.  Eyes:     General: No scleral icterus.       Right eye: No discharge.        Left eye: No discharge.     Conjunctiva/sclera: Conjunctivae normal.     Pupils: Pupils are equal, round, and reactive to light.  Neck:     Thyroid: No thyromegaly.     Vascular: No JVD.     Trachea: No tracheal deviation.  Cardiovascular:     Rate and Rhythm: Normal rate and regular rhythm.     Heart sounds: Normal heart sounds. No murmur heard.    No friction rub. No gallop.  Pulmonary:     Effort: Pulmonary effort is normal. No respiratory distress.     Breath sounds: Normal breath sounds. No wheezing or rales.  Chest:     Chest wall: No tenderness.  Abdominal:     General:  Bowel sounds are normal. There is no distension.     Palpations: Abdomen is soft. There is no mass.     Tenderness: There is no abdominal tenderness. There is no guarding or rebound.  Genitourinary:    Penis: No tenderness.   Musculoskeletal:        General: Normal range of motion.     Cervical back: Neck supple.     Comments: He is tender along the lateral joint space of the right knee, and there is a lot of crepitus. ROM is full   Lymphadenopathy:     Cervical: No cervical adenopathy.  Skin:    General: Skin is warm and dry.     Coloration: Skin is not pale.     Findings: No erythema or rash.  Neurological:     Mental Status: He is alert and oriented to person, place, and time.     Cranial Nerves: No cranial nerve deficit.     Motor: No abnormal muscle tone.     Coordination: Coordination normal.     Deep Tendon Reflexes: Reflexes are normal and symmetric. Reflexes normal.  Psychiatric:        Behavior:  Behavior normal.        Thought Content: Thought content normal.        Judgment: Judgment normal.           Assessment & Plan:  The OA in his right knee has gotten worse, so we will refer him to see Orthopedics. His GERD and CAD are stable. His HTN is well controlled. We will get fasting labs today to check lipids, A1c, etc. We spent a total of ( 35  ) minutes reviewing records and discussing these issues.  Alysia Penna, MD

## 2021-10-20 DIAGNOSIS — C44619 Basal cell carcinoma of skin of left upper limb, including shoulder: Secondary | ICD-10-CM | POA: Diagnosis not present

## 2021-10-20 DIAGNOSIS — Z85828 Personal history of other malignant neoplasm of skin: Secondary | ICD-10-CM | POA: Diagnosis not present

## 2021-10-20 DIAGNOSIS — C4442 Squamous cell carcinoma of skin of scalp and neck: Secondary | ICD-10-CM | POA: Diagnosis not present

## 2021-10-20 DIAGNOSIS — L570 Actinic keratosis: Secondary | ICD-10-CM | POA: Diagnosis not present

## 2021-11-02 ENCOUNTER — Telehealth: Payer: Self-pay | Admitting: Family Medicine

## 2021-11-02 ENCOUNTER — Inpatient Hospital Stay (HOSPITAL_COMMUNITY)
Admission: EM | Admit: 2021-11-02 | Discharge: 2021-11-05 | DRG: 177 | Disposition: A | Discharge status: Nursing Home (Includes ICF) | Payer: Medicare Other | Source: Skilled Nursing Facility | Attending: Internal Medicine | Admitting: Internal Medicine

## 2021-11-02 ENCOUNTER — Encounter (HOSPITAL_COMMUNITY): Payer: Self-pay | Admitting: Internal Medicine

## 2021-11-02 ENCOUNTER — Other Ambulatory Visit: Payer: Self-pay

## 2021-11-02 ENCOUNTER — Emergency Department (HOSPITAL_COMMUNITY): Payer: Medicare Other

## 2021-11-02 DIAGNOSIS — R001 Bradycardia, unspecified: Secondary | ICD-10-CM | POA: Diagnosis not present

## 2021-11-02 DIAGNOSIS — I110 Hypertensive heart disease with heart failure: Secondary | ICD-10-CM | POA: Diagnosis not present

## 2021-11-02 DIAGNOSIS — K449 Diaphragmatic hernia without obstruction or gangrene: Secondary | ICD-10-CM | POA: Diagnosis not present

## 2021-11-02 DIAGNOSIS — Z91041 Radiographic dye allergy status: Secondary | ICD-10-CM

## 2021-11-02 DIAGNOSIS — I480 Paroxysmal atrial fibrillation: Secondary | ICD-10-CM | POA: Diagnosis present

## 2021-11-02 DIAGNOSIS — N401 Enlarged prostate with lower urinary tract symptoms: Secondary | ICD-10-CM | POA: Diagnosis present

## 2021-11-02 DIAGNOSIS — Z66 Do not resuscitate: Secondary | ICD-10-CM | POA: Diagnosis not present

## 2021-11-02 DIAGNOSIS — G629 Polyneuropathy, unspecified: Secondary | ICD-10-CM | POA: Diagnosis not present

## 2021-11-02 DIAGNOSIS — I959 Hypotension, unspecified: Secondary | ICD-10-CM | POA: Diagnosis not present

## 2021-11-02 DIAGNOSIS — Z7902 Long term (current) use of antithrombotics/antiplatelets: Secondary | ICD-10-CM

## 2021-11-02 DIAGNOSIS — Z8249 Family history of ischemic heart disease and other diseases of the circulatory system: Secondary | ICD-10-CM

## 2021-11-02 DIAGNOSIS — R1311 Dysphagia, oral phase: Secondary | ICD-10-CM | POA: Diagnosis not present

## 2021-11-02 DIAGNOSIS — I255 Ischemic cardiomyopathy: Secondary | ICD-10-CM | POA: Diagnosis present

## 2021-11-02 DIAGNOSIS — R0603 Acute respiratory distress: Secondary | ICD-10-CM | POA: Diagnosis not present

## 2021-11-02 DIAGNOSIS — K219 Gastro-esophageal reflux disease without esophagitis: Secondary | ICD-10-CM | POA: Diagnosis present

## 2021-11-02 DIAGNOSIS — I252 Old myocardial infarction: Secondary | ICD-10-CM | POA: Diagnosis not present

## 2021-11-02 DIAGNOSIS — J1282 Pneumonia due to coronavirus disease 2019: Secondary | ICD-10-CM | POA: Diagnosis not present

## 2021-11-02 DIAGNOSIS — E861 Hypovolemia: Secondary | ICD-10-CM | POA: Diagnosis not present

## 2021-11-02 DIAGNOSIS — I2699 Other pulmonary embolism without acute cor pulmonale: Secondary | ICD-10-CM | POA: Diagnosis not present

## 2021-11-02 DIAGNOSIS — E871 Hypo-osmolality and hyponatremia: Secondary | ICD-10-CM | POA: Diagnosis present

## 2021-11-02 DIAGNOSIS — I5042 Chronic combined systolic (congestive) and diastolic (congestive) heart failure: Secondary | ICD-10-CM | POA: Diagnosis present

## 2021-11-02 DIAGNOSIS — R531 Weakness: Principal | ICD-10-CM

## 2021-11-02 DIAGNOSIS — E876 Hypokalemia: Secondary | ICD-10-CM | POA: Diagnosis present

## 2021-11-02 DIAGNOSIS — I10 Essential (primary) hypertension: Secondary | ICD-10-CM | POA: Diagnosis not present

## 2021-11-02 DIAGNOSIS — I482 Chronic atrial fibrillation, unspecified: Secondary | ICD-10-CM | POA: Diagnosis present

## 2021-11-02 DIAGNOSIS — R748 Abnormal levels of other serum enzymes: Secondary | ICD-10-CM | POA: Diagnosis not present

## 2021-11-02 DIAGNOSIS — R29898 Other symptoms and signs involving the musculoskeletal system: Secondary | ICD-10-CM | POA: Diagnosis not present

## 2021-11-02 DIAGNOSIS — Z955 Presence of coronary angioplasty implant and graft: Secondary | ICD-10-CM | POA: Diagnosis not present

## 2021-11-02 DIAGNOSIS — I2510 Atherosclerotic heart disease of native coronary artery without angina pectoris: Secondary | ICD-10-CM | POA: Diagnosis not present

## 2021-11-02 DIAGNOSIS — Z87891 Personal history of nicotine dependence: Secondary | ICD-10-CM | POA: Diagnosis not present

## 2021-11-02 DIAGNOSIS — M6281 Muscle weakness (generalized): Secondary | ICD-10-CM | POA: Diagnosis not present

## 2021-11-02 DIAGNOSIS — U071 COVID-19: Secondary | ICD-10-CM | POA: Diagnosis not present

## 2021-11-02 DIAGNOSIS — N138 Other obstructive and reflux uropathy: Secondary | ICD-10-CM | POA: Diagnosis present

## 2021-11-02 DIAGNOSIS — R070 Pain in throat: Secondary | ICD-10-CM | POA: Diagnosis not present

## 2021-11-02 DIAGNOSIS — R419 Unspecified symptoms and signs involving cognitive functions and awareness: Secondary | ICD-10-CM | POA: Diagnosis not present

## 2021-11-02 DIAGNOSIS — J9601 Acute respiratory failure with hypoxia: Secondary | ICD-10-CM | POA: Diagnosis present

## 2021-11-02 DIAGNOSIS — Z7982 Long term (current) use of aspirin: Secondary | ICD-10-CM

## 2021-11-02 DIAGNOSIS — J9600 Acute respiratory failure, unspecified whether with hypoxia or hypercapnia: Secondary | ICD-10-CM | POA: Diagnosis not present

## 2021-11-02 DIAGNOSIS — Z9181 History of falling: Secondary | ICD-10-CM | POA: Diagnosis not present

## 2021-11-02 DIAGNOSIS — R509 Fever, unspecified: Secondary | ICD-10-CM | POA: Diagnosis not present

## 2021-11-02 DIAGNOSIS — R778 Other specified abnormalities of plasma proteins: Secondary | ICD-10-CM | POA: Diagnosis present

## 2021-11-02 DIAGNOSIS — W19XXXA Unspecified fall, initial encounter: Secondary | ICD-10-CM | POA: Diagnosis not present

## 2021-11-02 DIAGNOSIS — R600 Localized edema: Secondary | ICD-10-CM | POA: Diagnosis not present

## 2021-11-02 DIAGNOSIS — R1312 Dysphagia, oropharyngeal phase: Secondary | ICD-10-CM | POA: Diagnosis not present

## 2021-11-02 DIAGNOSIS — E785 Hyperlipidemia, unspecified: Secondary | ICD-10-CM | POA: Diagnosis not present

## 2021-11-02 DIAGNOSIS — R609 Edema, unspecified: Secondary | ICD-10-CM | POA: Diagnosis not present

## 2021-11-02 DIAGNOSIS — Z9861 Coronary angioplasty status: Secondary | ICD-10-CM

## 2021-11-02 DIAGNOSIS — Z79899 Other long term (current) drug therapy: Secondary | ICD-10-CM

## 2021-11-02 DIAGNOSIS — R262 Difficulty in walking, not elsewhere classified: Secondary | ICD-10-CM | POA: Diagnosis not present

## 2021-11-02 LAB — CBC WITH DIFFERENTIAL/PLATELET
Abs Immature Granulocytes: 0.04 10*3/uL (ref 0.00–0.07)
Basophils Absolute: 0.1 10*3/uL (ref 0.0–0.1)
Basophils Relative: 1 %
Eosinophils Absolute: 0 10*3/uL (ref 0.0–0.5)
Eosinophils Relative: 0 %
HCT: 35.9 % — ABNORMAL LOW (ref 39.0–52.0)
Hemoglobin: 12.3 g/dL — ABNORMAL LOW (ref 13.0–17.0)
Immature Granulocytes: 1 %
Lymphocytes Relative: 14 %
Lymphs Abs: 1 10*3/uL (ref 0.7–4.0)
MCH: 32 pg (ref 26.0–34.0)
MCHC: 34.3 g/dL (ref 30.0–36.0)
MCV: 93.5 fL (ref 80.0–100.0)
Monocytes Absolute: 1.2 10*3/uL — ABNORMAL HIGH (ref 0.1–1.0)
Monocytes Relative: 17 %
Neutro Abs: 4.7 10*3/uL (ref 1.7–7.7)
Neutrophils Relative %: 67 %
Platelets: 177 10*3/uL (ref 150–400)
RBC: 3.84 MIL/uL — ABNORMAL LOW (ref 4.22–5.81)
RDW: 13.8 % (ref 11.5–15.5)
WBC: 7 10*3/uL (ref 4.0–10.5)
nRBC: 0 % (ref 0.0–0.2)

## 2021-11-02 LAB — BLOOD GAS, VENOUS
Acid-base deficit: 11.5 mmol/L — ABNORMAL HIGH (ref 0.0–2.0)
Bicarbonate: 13.9 mmol/L — ABNORMAL LOW (ref 20.0–28.0)
O2 Saturation: 61 %
Patient temperature: 37
pCO2, Ven: 29 mmHg — ABNORMAL LOW (ref 44–60)
pH, Ven: 7.29 (ref 7.25–7.43)
pO2, Ven: 37 mmHg (ref 32–45)

## 2021-11-02 LAB — BASIC METABOLIC PANEL
Anion gap: 9 (ref 5–15)
BUN: 18 mg/dL (ref 8–23)
CO2: 18 mmol/L — ABNORMAL LOW (ref 22–32)
Calcium: 8.5 mg/dL — ABNORMAL LOW (ref 8.9–10.3)
Chloride: 105 mmol/L (ref 98–111)
Creatinine, Ser: 0.72 mg/dL (ref 0.61–1.24)
GFR, Estimated: >60 mL/min (ref >60)
Glucose, Bld: 116 mg/dL — ABNORMAL HIGH (ref 70–99)
Potassium: 3.7 mmol/L (ref 3.5–5.1)
Sodium: 132 mmol/L — ABNORMAL LOW (ref 135–145)

## 2021-11-02 LAB — RESP PANEL BY RT-PCR (FLU A&B, COVID) ARPGX2
Influenza A by PCR: NEGATIVE
Influenza B by PCR: NEGATIVE
SARS Coronavirus 2 by RT PCR: POSITIVE — AB

## 2021-11-02 LAB — TROPONIN I (HIGH SENSITIVITY)
Troponin I (High Sensitivity): 29 ng/L — ABNORMAL HIGH (ref <18)
Troponin I (High Sensitivity): 18 ng/L — ABNORMAL HIGH (ref <18)

## 2021-11-02 LAB — LACTATE DEHYDROGENASE: LDH: 216 U/L — ABNORMAL HIGH (ref 98–192)

## 2021-11-02 LAB — BRAIN NATRIURETIC PEPTIDE: B Natriuretic Peptide: 256 pg/mL — ABNORMAL HIGH (ref 0.0–100.0)

## 2021-11-02 LAB — D-DIMER, QUANTITATIVE: D-Dimer, Quant: 2.21 ug/mL-FEU — ABNORMAL HIGH (ref 0.00–0.50)

## 2021-11-02 LAB — PHOSPHORUS: Phosphorus: 3.3 mg/dL (ref 2.5–4.6)

## 2021-11-02 LAB — C-REACTIVE PROTEIN: CRP: 4.8 mg/dL — ABNORMAL HIGH (ref <1.0)

## 2021-11-02 LAB — OSMOLALITY: Osmolality: 276 mOsm/kg (ref 275–295)

## 2021-11-02 LAB — CK: Total CK: 495 U/L — ABNORMAL HIGH (ref 49–397)

## 2021-11-02 LAB — TSH: TSH: 2.039 u[IU]/mL (ref 0.350–4.500)

## 2021-11-02 LAB — FERRITIN: Ferritin: 228 ng/mL (ref 24–336)

## 2021-11-02 LAB — PROCALCITONIN: Procalcitonin: <0.1 ng/mL

## 2021-11-02 LAB — MAGNESIUM: Magnesium: 1.9 mg/dL (ref 1.7–2.4)

## 2021-11-02 MED ORDER — METHYLPREDNISOLONE SODIUM SUCC 40 MG IJ SOLR
40.0000 mg | Freq: Once | INTRAMUSCULAR | Status: AC
Start: 2021-11-02 — End: 2021-11-02
  Administered 2021-11-02: 40 mg via INTRAVENOUS
  Filled 2021-11-02: qty 1

## 2021-11-02 MED ORDER — PREDNISONE 50 MG PO TABS
50.0000 mg | ORAL_TABLET | Freq: Every day | ORAL | Status: DC
Start: 2021-11-06 — End: 2021-11-03

## 2021-11-02 MED ORDER — GABAPENTIN 300 MG PO CAPS
300.0000 mg | ORAL_CAPSULE | Freq: Three times a day (TID) | ORAL | Status: DC
  Administered 2021-11-02 – 2021-11-05 (×8): 300 mg via ORAL
  Filled 2021-11-02 (×8): qty 1

## 2021-11-02 MED ORDER — SODIUM CHLORIDE 0.9% FLUSH
3.0000 mL | Freq: Two times a day (BID) | INTRAVENOUS | Status: DC
  Administered 2021-11-02: 3 mL via INTRAVENOUS

## 2021-11-02 MED ORDER — ONDANSETRON HCL 4 MG/2ML IJ SOLN: 4.0000 mg | Freq: Four times a day (QID) | INTRAMUSCULAR | Status: DC | PRN

## 2021-11-02 MED ORDER — ONDANSETRON HCL 4 MG PO TABS: 4.0000 mg | ORAL_TABLET | Freq: Four times a day (QID) | ORAL | Status: DC | PRN

## 2021-11-02 MED ORDER — DEXAMETHASONE SODIUM PHOSPHATE 10 MG/ML IJ SOLN
10.0000 mg | Freq: Once | INTRAMUSCULAR | Status: AC
  Administered 2021-11-02: 10 mg via INTRAVENOUS
  Filled 2021-11-02: qty 1

## 2021-11-02 MED ORDER — SODIUM CHLORIDE 0.9 % IV SOLN: 250.0000 mL | INTRAVENOUS | Status: DC | PRN

## 2021-11-02 MED ORDER — HYDROCODONE-ACETAMINOPHEN 5-325 MG PO TABS: 1.0000 | ORAL_TABLET | ORAL | Status: DC | PRN

## 2021-11-02 MED ORDER — GUAIFENESIN ER 600 MG PO TB12
600.0000 mg | ORAL_TABLET | Freq: Two times a day (BID) | ORAL | Status: DC
  Administered 2021-11-02 – 2021-11-03 (×2): 600 mg via ORAL
  Filled 2021-11-02 (×2): qty 1

## 2021-11-02 MED ORDER — METHYLPREDNISOLONE SODIUM SUCC 40 MG IJ SOLR
0.5000 mg/kg | Freq: Two times a day (BID) | INTRAMUSCULAR | Status: DC
  Administered 2021-11-03: 35.6 mg via INTRAVENOUS
  Filled 2021-11-02: qty 1

## 2021-11-02 MED ORDER — ALBUTEROL SULFATE (2.5 MG/3ML) 0.083% IN NEBU: 2.5000 mg | INHALATION_SOLUTION | RESPIRATORY_TRACT | Status: DC | PRN

## 2021-11-02 MED ORDER — ATORVASTATIN CALCIUM 40 MG PO TABS
40.0000 mg | ORAL_TABLET | Freq: Every day | ORAL | Status: DC
  Administered 2021-11-03 – 2021-11-05 (×3): 40 mg via ORAL
  Filled 2021-11-02 (×3): qty 1

## 2021-11-02 MED ORDER — SODIUM CHLORIDE 0.9% FLUSH
3.0000 mL | Freq: Two times a day (BID) | INTRAVENOUS | Status: DC
Start: 2021-11-02 — End: 2021-11-05
  Administered 2021-11-02 – 2021-11-05 (×5): 3 mL via INTRAVENOUS

## 2021-11-02 MED ORDER — DIPHENHYDRAMINE HCL 25 MG PO CAPS
50.0000 mg | ORAL_CAPSULE | Freq: Once | ORAL | Status: AC
  Administered 2021-11-03: 50 mg via ORAL
  Filled 2021-11-02: qty 2

## 2021-11-02 MED ORDER — TICAGRELOR 90 MG PO TABS
90.0000 mg | ORAL_TABLET | Freq: Two times a day (BID) | ORAL | Status: DC
Start: 2021-11-02 — End: 2021-11-05
  Administered 2021-11-02 – 2021-11-05 (×6): 90 mg via ORAL
  Filled 2021-11-02 (×6): qty 1

## 2021-11-02 MED ORDER — PANTOPRAZOLE SODIUM 40 MG PO TBEC
40.0000 mg | DELAYED_RELEASE_TABLET | Freq: Every day | ORAL | Status: DC
  Administered 2021-11-03 – 2021-11-05 (×3): 40 mg via ORAL
  Filled 2021-11-02 (×3): qty 1

## 2021-11-02 MED ORDER — DIPHENHYDRAMINE HCL 50 MG/ML IJ SOLN: 50.0000 mg | Freq: Once | INTRAMUSCULAR | Status: AC

## 2021-11-02 MED ORDER — SODIUM CHLORIDE 0.9% FLUSH
3.0000 mL | INTRAVENOUS | Status: DC | PRN
Start: 2021-11-02 — End: 2021-11-03

## 2021-11-02 MED ORDER — SODIUM CHLORIDE 0.9 % IV SOLN
100.0000 mg | Freq: Every day | INTRAVENOUS | Status: DC
  Filled 2021-11-02: qty 20

## 2021-11-02 MED ORDER — ACETAMINOPHEN 325 MG PO TABS: 650.0000 mg | ORAL_TABLET | Freq: Four times a day (QID) | ORAL | Status: DC | PRN

## 2021-11-02 MED ORDER — SODIUM CHLORIDE 0.9% FLUSH: 3.0000 mL | INTRAVENOUS | Status: DC | PRN

## 2021-11-02 MED ORDER — SODIUM CHLORIDE 0.9 % IV SOLN
200.0000 mg | Freq: Once | INTRAVENOUS | Status: AC
  Administered 2021-11-02: 200 mg via INTRAVENOUS
  Filled 2021-11-02: qty 40

## 2021-11-02 MED ORDER — ASPIRIN 81 MG PO CHEW
81.0000 mg | CHEWABLE_TABLET | Freq: Every day | ORAL | Status: DC
  Administered 2021-11-03 – 2021-11-04 (×2): 81 mg via ORAL
  Filled 2021-11-02 (×2): qty 1

## 2021-11-02 MED ORDER — GUAIFENESIN-DM 100-10 MG/5ML PO SYRP: 10.0000 mL | ORAL_SOLUTION | ORAL | Status: DC | PRN

## 2021-11-02 MED ORDER — SODIUM CHLORIDE 0.9 % IV SOLN: INTRAVENOUS | Status: DC

## 2021-11-02 NOTE — Progress Notes (Signed)
Pt instructed on use of Flutter Valve.  Pt demonstrated with good effort and technique.  

## 2021-11-02 NOTE — Assessment & Plan Note (Addendum)
Continue aspirin 81 mg daily continue Lipitor 40 mg daily  continue Brilinta Hold metoprolol given bradycardia

## 2021-11-02 NOTE — H&P (Signed)
Gabriel [name]:[id number] DOB: 01/12/1925 DOA: 11/02/2021     PCP: Laurey Morale, MD   Outpatient Specialists:  CARDSLarina Earthly NP, Shelva Majestic    Patient arrived to ER on 11/02/21 at 1549 Referred by Attending Regan Lemming, MD   Patient [facility]:     [facility]  Chief Complaint:   Chief Complaint  Patient presents with   Weakness    HPI: Gabriel [name] is a 88 y.o. male with medical history significant of CAD, hypertension BPH dysphagia and GERD HLD neuropathy paroxysmal atrial fibrillation, ischemic cardiomyopathy    Presented with   generalized weakness and fall incidental COVID Complaining of fatigue for the past day had a fall this morning did not hit his head no LOC.  He had an episode of incontinence associated that is also has been having low-grade fevers incidentally at the facility was found to be COVID-positive.  Transported to [facility] patient has known history of CAD but no chest pain or shortness of breath no dysuria abdominal pain   Per family he was not even coughing that much but has been having some sore throat and that is why his COVID test was done. He has not had any chest pain or shortness of breath he does have mild lower extremity swelling for which they were going to take him to orthopedics Left worse than right   Initial COVID TEST    POSITIVE,     Lab Results  Component Value Date   [place] (A) 11/02/2021   [place] NEGATIVE 06/06/2021     Regarding pertinent Chronic problems:     Hyperlipidemia - on statins Lipitor (atorvastatin)  Lipid Panel     Component Value Date/Time   CHOL 93 10/14/2021 1141   TRIG 71.0 10/14/2021 1141   HDL 45.60 10/14/2021 1141   CHOLHDL 2 10/14/2021 1141   VLDL 14.2 10/14/2021 1141   LDLCALC 33 10/14/2021 1141   LDLDIRECT 72.0 09/27/2017 0939     HTN on metoprolol  GERD on Protonix   chronic CHF diastolic/systolic/ combined - last echo done in February  showed EF 40-45% with apical anterior wall motion hypokinesis    CAD status post STEMI status post DES MID LAD-  On Aspirin, statin, betablocker, Brilinta                - *followed by cardiology                - last cardiac cath February 2023 Status post DES secondary to STEMI CAD with thrombotic 90% mid LAD stenosis and 99% subtotal occlusion of the distal LAD, otherwise mild nonobstructive CAD in the left circumflex        A. Fib -  - CHA2DS2 vas score  6 cardiology flutter was mostly sinus bradycardia with occasional junctional beats.  No recurrence not on anticoagulation to avoid triple therapy on metoprolol only     While in ER: Clinical Course as of 11/02/21 1939  Mon Nov 02, 2021  1827 Troponin I (High Sensitivity)(!): 29 [JL]    Clinical Course User Index [JL] Regan Lemming, MD   Noted to be hypoxic requiring 2 L    CXR -patchy and heterogeneous bilateral lower lung opacities left greater than leg suspicious for pneumonia settings COVID   CTA chest -ordered  Following Medications were ordered in ER: Medications  dexamethasone (DECADRON) injection 10 mg (10 mg Intravenous Given 11/02/21 1901)  ED Triage Vitals  Enc Vitals Group     BP 11/02/21 1618 (!) 121/54     Pulse Rate 11/02/21 1618 64     Resp 11/02/21 1618 20     Temp 11/02/21 1618 98.9 F (37.2 C)     Temp Source 11/02/21 1618 Oral     SpO2 11/02/21 1618 92 %     Weight --      Height --      Head Circumference --      Peak Flow --      Pain Score 11/02/21 1610 6     Pain Loc --      Pain Edu? --      Excl. in [place]? --   TMAX(24)@     _________________________________________ Significant initial  Findings: Abnormal Labs Reviewed  CBC WITH DIFFERENTIAL/PLATELET - Abnormal; Notable for the following components:      Result Value   RBC 3.84 (*)    Hemoglobin 12.3 (*)    HCT 35.9 (*)    Monocytes Absolute 1.2 (*)    All other components within normal limits  BASIC METABOLIC PANEL -  Abnormal; Notable for the following components:   Sodium 132 (*)    CO2 18 (*)    Glucose, Bld 116 (*)    Calcium 8.5 (*)    All other components within normal limits  TROPONIN I (HIGH SENSITIVITY) - Abnormal; Notable for the following components:   Troponin I (High Sensitivity) 29 (*)    All other components within normal limits     ____________________ Troponin 29 ECG: Ordered Personally reviewed and interpreted by me showing: HR : 55 Rhythm:  A.fib.  Ischemic non acute QTC 441    The recent clinical data is shown below. Vitals:   11/02/21 1900 11/02/21 1915 11/02/21 1930 11/02/21 1932  BP: (!) 149/63 (!) 141/95  (!) 141/95  Pulse:  (!) 115 82 82  Resp:    17  Temp:      TempSrc:      SpO2: 100% 90% 94% 95%    WBC     Component Value Date/Time   WBC 7.0 11/02/2021 1640   LYMPHSABS 1.0 11/02/2021 1640   MONOABS 1.2 (H) 11/02/2021 1640   EOSABS 0.0 11/02/2021 1640   BASOSABS 0.1 11/02/2021 1640      Procalcitonin <0.1   UA  ordered     Results for orders placed or performed during the hospital encounter of 11/02/21  Resp Panel by RT-PCR (Flu A&B, Covid) Anterior Nasal Swab     Status: Abnormal   Collection Time: 11/02/21  7:18 PM   Specimen: Anterior Nasal Swab  Result Value Ref Range Status   SARS Coronavirus 2 by RT PCR POSITIVE (A) NEGATIVE Final         Influenza A by PCR NEGATIVE NEGATIVE Final   Influenza B by PCR NEGATIVE NEGATIVE Final          _______________________________________________ Hospitalist was called for admission for   Weakness  COVID-19  Acute respiratory failure with hypoxia ([place])     The following Work up has been ordered so far:  Orders Placed This Encounter  Procedures   Resp Panel by RT-PCR (Flu A&B, Covid) Anterior Nasal Swab   DG Chest Portable 1 View   CBC with Differential   Basic metabolic panel   Magnesium   Urinalysis, Routine w reflex microscopic   Check Pulse Oximetry while ambulating   Consult to  hospitalist   Airborne  and Contact precautions   ED EKG     OTHER Significant initial  Findings:  labs showing:    Recent Labs  Lab 11/02/21 1640 11/02/21 1648  NA 132*  --   K 3.7  --   CO2 18*  --   GLUCOSE 116*  --   BUN 18  --   CREATININE 0.72  --   CALCIUM 8.5*  --   MG 1.9  --   PHOS  --  3.3    Cr   stable,    Lab Results  Component Value Date   CREATININE 0.72 11/02/2021   CREATININE 0.58 10/14/2021   CREATININE 0.70 06/10/2021    No results for input(s): "AST", "ALT", "ALKPHOS", "BILITOT", "PROT", "ALBUMIN" in the last 168 hours. Lab Results  Component Value Date   CALCIUM 8.5 (L) 11/02/2021        Plt: Lab Results  Component Value Date   PLT 177 11/02/2021     COVID-19 Labs  Recent Labs    11/02/21 1648  DDIMER 2.21*  FERRITIN 228  LDH 216*  CRP 4.8*    Lab Results  Component Value Date   SARSCOV2NAA NEGATIVE 06/06/2021     pH, Ven 7.29 Acid-base deficit 11.5 High  mmol/L  pCO2, Ven 29 Low  mmHg O2 Saturation 61 %  pO2, Ven 37 mmHg           Recent Labs  Lab 11/02/21 1640  WBC 7.0  NEUTROABS 4.7  HGB 12.3*  HCT 35.9*  MCV 93.5  PLT 177    HG/HCT stable,       Component Value Date/Time   HGB 12.3 (L) 11/02/2021 1640   HCT 35.9 (L) 11/02/2021 1640   MCV 93.5 11/02/2021 1640       Cardiac Panel (last 3 results) Recent Labs    11/02/21 1855  CKTOTAL 495*    .car BNP (last 3 results) Recent Labs    11/02/21 1640  BNP 256.0*     DM  labs:  HbA1C: Recent Labs    06/06/21 2240 10/14/21 1141  HGBA1C 5.0 5.5      Cultures: No results found for: "SDES", "SPECREQUEST", "CULT", "REPTSTATUS"   Radiological Exams on Admission: DG Chest Portable 1 View  Result Date: 11/02/2021 CLINICAL DATA:  COVID.  Weakness, fever and chills. EXAM: PORTABLE CHEST 1 VIEW COMPARISON:  Chest radiograph 06/07/2021 FINDINGS: Patchy and heterogeneous bilateral lower lung zone opacities, left greater than right. Lower lung  volumes from prior exam. Upper normal heart size. Aortic atherosclerosis. No pulmonary edema, large pleural effusion, or pneumothorax. No acute osseous findings. IMPRESSION: Patchy and heterogeneous bilateral lower lung zone opacities, left greater than right, suspicious for pneumonia in the setting of COVID. Electronically Signed   By: Keith Rake M.D.   On: 11/02/2021 17:09   _______________________________________________________________________________________________________ Latest   Blood pressure (!) 141/95, pulse 82, temperature 98.9 F (37.2 C), temperature source Oral, resp. rate 17, SpO2 95 %.   Vitals  labs and radiology finding personally reviewed  Review of Systems:    Pertinent positives include:  chills, fatigue,  Constitutional:  No weight loss, night sweats, Fevers,  weight loss  HEENT:  No headaches, Difficulty swallowing,Tooth/dental problems,Sore throat,  No sneezing, itching, ear ache, nasal congestion, post nasal drip,  Cardio-vascular:  No chest pain, Orthopnea, PND, anasarca, dizziness, palpitations.no Bilateral lower extremity swelling  GI:  No heartburn, indigestion, abdominal pain, nausea, vomiting, diarrhea, change in bowel habits, loss of appetite, melena, blood in  stool, hematemesis Resp:  no shortness of breath at rest. No dyspnea on exertion, No excess mucus, no productive cough, No non-productive cough, No coughing up of blood.No change in color of mucus.No wheezing. Skin:  no rash or lesions. No jaundice GU:  no dysuria, change in color of urine, no urgency or frequency. No straining to urinate.  No flank pain.  Musculoskeletal:  No joint pain or no joint swelling. No decreased range of motion. No back pain.  Psych:  No change in mood or affect. No depression or anxiety. No memory loss.  Neuro: no localizing neurological complaints, no tingling, no weakness, no double vision, no gait abnormality, no slurred speech, no confusion  All systems  reviewed and apart from [place] all are negative _______________________________________________________________________________________________ Past Medical History:   Past Medical History:  Diagnosis Date   Arthritis    Diverticulosis of colon    External thrombosed hemorrhoids 06/11/2009   Qualifier: Diagnosis of  By: Sarajane Jews MD, Ishmael Holter    GERD (gastroesophageal reflux disease)    Hypertension       Past Surgical History:  Procedure Laterality Date   cataract extaction, right  6/09   per Dr. [name]   CATARACT EXTRACTION Left    COLONOSCOPY  09/23/04   per Dr. [name], clear, no repeats needed    CORONARY STENT INTERVENTION N/A 06/06/2021   Procedure: CORONARY STENT INTERVENTION;  Surgeon: [name], MD;  Location: [facility];  Service: Cardiovascular;  Laterality: N/A;   CORONARY/GRAFT ACUTE MI REVASCULARIZATION N/A 06/06/2021   Procedure: Coronary/Graft Acute MI Revascularization;  Surgeon: [name], MD;  Location: [facility];  Service: Cardiovascular;  Laterality: N/A;   EGD and esophageal diatation,  05-10-11   per Dr. [name]     HERNIA REPAIR     LEFT HEART CATH AND CORONARY ANGIOGRAPHY N/A 06/06/2021   Procedure: LEFT HEART CATH AND CORONARY ANGIOGRAPHY;  Surgeon: [name], MD;  Location: [facility];  Service: Cardiovascular;  Laterality: N/A;   rt inguinal hernia     x2    Social History:      reports that he has quit smoking. His smoking use included cigarettes. He has a 7.50 pack-year smoking history. He has never used smokeless tobacco. He reports that he does not drink alcohol and does not use drugs.     Family History:   Family History  Problem Relation Age of Onset   Coronary artery disease Father    Colon cancer Neg Hx    Stomach cancer Neg Hx    Esophageal cancer Neg Hx    ______________________________________________________________________________________________ Allergies: Allergies  Allergen Reactions    Contrast Media [Iodinated Contrast Media] Rash     Prior to Admission medications   Medication Sig Start Date End Date Taking? Authorizing Provider  aspirin 81 MG tablet Take 81 mg by mouth daily.    [provider]  atorvastatin (LIPITOR) 40 MG tablet Take 1 tablet (40 mg total) by mouth daily. 06/23/21   Lenna Sciara, NP  gabapentin (NEURONTIN) 300 MG capsule TAKE 1 CAPSULE BY MOUTH THREE TIMES A DAY Patient taking differently: Take 300 mg by mouth 3 (three) times daily. 02/11/21   Laurey Morale, MD  metoprolol tartrate (LOPRESSOR) 25 MG tablet Take 1 tablet (25 mg total) by mouth 2 (two) times daily. 06/23/21   Lenna Sciara, NP  Multiple Vitamin (MULTIVITAMIN WITH MINERALS) TABS tablet Take 1 tablet by mouth every morning.    [provider]  nitroGLYCERIN (NITROSTAT) 0.4 MG SL tablet Place 1 tablet (0.4 mg total) under the tongue every 5 (five) minutes as needed for chest pain. 06/10/21 06/10/22  Cheryln Manly, NP  pantoprazole (PROTONIX) 40 MG tablet Take 1 tablet (40 mg total) by mouth daily. 09/22/21   Laurey Morale, MD  potassium chloride (KLOR-CON 10) 10 MEQ tablet Take 1 tablet (10 mEq total) by mouth daily. 06/11/21   Laurey Morale, MD  ticagrelor (BRILINTA) 90 MG TABS tablet Take 1 tablet (90 mg total) by mouth 2 (two) times daily. 06/23/21   Lenna Sciara, NP  vitamin C (ASCORBIC ACID) 500 MG tablet Take 500 mg by mouth daily. Reported on 10/27/2015    [provider]    ___________________________________________________________________________________________________ Physical Exam:    11/02/2021    7:32 PM 11/02/2021    7:30 PM 11/02/2021    7:15 PM  Vitals with BMI  Systolic 353  299  Diastolic 95  95  Pulse 82 82 115     1. General:  in No  Acute distress   Chronically ill   -appearing 2. Psychological: Alert and   Oriented 3. Head/ENT:   Dry Mucous Membranes                          Head Non traumatic, neck supple                           Poor Dentition 4. SKIN: normal  Skin turgor,  Skin clean Dry and intact no rash 5. Heart: Regular rate and rhythm no  Murmur, no Rub or gallop 6. Lungs: , no wheezes or crackles   7. Abdomen: Soft,  non-tender, Non distended   obese  bowel sounds present 8. Lower extremities: no clubbing, cyanosis, trace edema L>R 9. Neurologically Grossly intact, moving all 4 extremities equally   10. MSK: Normal range of motion    Chart has been reviewed  ______________________________________________________________________________________________  Assessment/Plan  88 y.o. male with medical history significant of CAD, hypertension BPH dysphagia and GERD HLD neuropathy paroxysmal atrial fibrillation, ischemic cardiomyopathy    Admitted for   Weakness  COVID-19, Acute respiratory failure with hypoxia (HCC) Bradycardia   Present on Admission:  Acute respiratory failure due to COVID-19 [facility])  Essential hypertension  GERD  Hyperlipidemia  Paroxysmal atrial fibrillation ([place])  COVID-19 virus infection  Hyponatremia  Bradycardia  BPH with urinary obstruction  Elevated CK  Edema of left lower leg  Elevated troponin   Acute respiratory failure due to COVID-19 [facility])  this patient has acute respiratory failure with Hypoxia  as documented by the presence of following: O2 saturatio< 90% on RA Likely due to:   COVID pneumonia Provide O2 therapy and titrate as needed  Continuous pulse ox  check Pulse ox with ambulation prior to discharge   may need  TC consult for home O2 set up  Prone if able  flutter valve ordered  Given hypoxia and elevated D-dimer we will check CTA patient will need to be premedicated for the   Essential hypertension Hold metoprolol for now  GERD Continue Protonix 40 mg daily  Hyperlipidemia Continue Lipitor 40 mg daily  Paroxysmal atrial fibrillation (HCC) Not on anticoagulation  We will hold metoprolol given bradycardia  CAD S/P percutaneous coronary  angioplasty Continue aspirin 81 mg daily continue Lipitor 40 mg daily  continue Brilinta Hold metoprolol given bradycardia  COVID-19 virus infection Treated with remdesivir and steroids given hypoxia and evidence of infiltrates on chest x-ray. Obtain inflammatory markers. Prone as needed oxygen as needed   Hyponatremia Obtain urine electrolytes and follow closely avoid over aggressive fluid rehydration in setting of COVID  Bradycardia Hold metoprolol BP stable No complaints of chest pain.  Will notify cardiology patient is being admitted Monitor in stepdown progressive given bradycardia Check TSH.  Check echogram   BPH with urinary obstruction Monitor fluid output monitor for any sign of obstruction  Elevated CK Rehydrate and recheck in a.m.  Edema of left lower leg Elevated D-dimer leg edema left versus right but both bilaterally.  We will check Dopplers  Elevated troponin Continue to monitor.  Patient denies any chest pain known history of CAD recent stent continue aspirin and Brilinta. Notify cardiology of patient's been admitted Troponin much lower than when patient had a STEMI which is reassuring with good trend EKG showing A-fib with slow rate no evidence of STEMI or acute ischemia at this time   Other plan as per orders.  DVT prophylaxis:  SCD     Code Status:    Code Status: Prior FULL CODE   as per patien family  I had personally discussed CODE STATUS with patient and family     Family Communication:   Family at  Bedside  plan of care was discussed  with   Daughter,   Disposition Plan:                              Back to current facility when stable                               Following barriers for discharge:                            Electrolytes corrected                                                         Will need consultants to evaluate patient prior to discharge                       Would benefit from PT/OT eval prior to DC  Ordered                    Swallow eval - SLP ordered                                      Transition of care consulted                   Nutrition    consulted                                      Consults called:  Attempted to call cardiology we will email them as well  Admission status:  ED Disposition     ED Disposition  Admit   Condition  --  Comment  Hospital Area: [facility] [[id number]]  Level of Care: Progressive [102]  Admit to Progressive based on following criteria: CARDIOVASCULAR & THORACIC of moderate stability with acute coronary syndrome symptoms/low risk myocardial infarction/hypertensive urgency/arrhythmias/heart failure potentially compromising stability and stable post cardiovascular intervention patients.  Admit to Progressive based on following criteria: RESPIRATORY PROBLEMS hypoxemic/hypercapnic respiratory failure that is responsive to NIPPV (BiPAP) or High Flow Nasal Cannula (6-80 lpm). Frequent assessment/intervention, no > Q2 hrs < Q4 hrs, to maintain oxygenation and pulmonary hygiene.  May admit patient to [facility] or [facility] if equivalent level of care is available:: No  Covid Evaluation: Confirmed COVID Positive  Diagnosis: Acute respiratory failure due to COVID-19 [facility] [[id number]]  Admitting Physician: Toy Baker [[id number]]  Attending Physician: Toy Baker [[id number]]  Certification:: I certify this patient will need inpatient services for at least 2 midnights  Estimated Length of Stay: 2           inpatient     I Expect 2 midnight stay secondary to severity of patient's current illness need for inpatient interventions justified by the following:  hemodynamic instability despite optimal treatment ( hypoxia,  )  Severe lab/radiological/exam abnormalities including:   Bradycardia and extensive comorbidities including:  CAD   That are currently affecting medical management.   I expect  patient to be hospitalized for 2  midnights requiring inpatient medical care.  Patient is at high risk for adverse outcome (such as loss of life or disability) if not treated.  Indication for inpatient stay as follows:    New or worsening hypoxia   Need for   IV fluids, IV viral    Level of care      progressive tele indefinitely please discontinue once patient no longer qualifies COVID-19 Labs    Lab Results  Component Value Date   [place] 06/06/2021     Precautions: admitted as covid positive Airborne and Contact precautions    Kamsiyochukwu Buist 11/03/2021, 12:04 AM    [facility]     after 2 AM please page floor coverage PA If 7AM-7PM, please contact the day team taking care of the patient using [email or web address]   Patient was evaluated in the context of the global COVID-19 pandemic, which necessitated consideration that the patient might be at risk for infection with the SARS-CoV-2 virus that causes COVID-19. Institutional protocols and algorithms that pertain to the evaluation of patients at risk for COVID-19 are in a state of rapid change based on information released by regulatory bodies including the CDC and federal and state organizations. These policies and algorithms were followed during the patient's care.

## 2021-11-02 NOTE — Assessment & Plan Note (Addendum)
Hold metoprolol BP stable No complaints of chest pain.  Will notify cardiology patient is being admitted Monitor in stepdown progressive given bradycardia Check TSH.  Check echogram

## 2021-11-02 NOTE — ED Provider Notes (Signed)
[facility] DEPT Provider Note   CSN: [id number] Arrival date & time: 11/02/21  1549     History  Chief Complaint  Patient presents with   Weakness    Gabriel [name] is a 88 y.o. male.   Weakness Associated symptoms: fever     88 year old male with medical history significant for STEMI status post revascularization in February 2023 with successful PCI to the mid and distal LAD with drug-eluting stent placement, on aspirin and Brilinta who presents to the emergency department with fatigue and weakness over the past day.  The patient fell this morning was having overall generalized weakness.  No head trauma or loss of consciousness.  Did have an episode of incontinence.  Has had low-grade fevers at home.  Was tested after his fall at his facility and was found to be incidentally COVID-positive.  He was then transported to the [facility] long emergency department for further management.  On arrival, he complained of diffuse weakness, no focal weakness, generalized fatigue.  He denied any chest pain or shortness of breath.  He denied any dysuria or abdominal pain.  Home Medications Prior to Admission medications   Medication Sig Start Date End Date Taking? Authorizing Provider  aspirin 81 MG tablet Take 81 mg by mouth daily.    [provider]  atorvastatin (LIPITOR) 40 MG tablet Take 1 tablet (40 mg total) by mouth daily. 06/23/21   Lenna Sciara, NP  gabapentin (NEURONTIN) 300 MG capsule TAKE 1 CAPSULE BY MOUTH THREE TIMES A DAY Patient taking differently: Take 300 mg by mouth 3 (three) times daily. 02/11/21   Laurey Morale, MD  metoprolol tartrate (LOPRESSOR) 25 MG tablet Take 1 tablet (25 mg total) by mouth 2 (two) times daily. 06/23/21   Lenna Sciara, NP  Multiple Vitamin (MULTIVITAMIN WITH MINERALS) TABS tablet Take 1 tablet by mouth every morning.    [provider]  nitroGLYCERIN (NITROSTAT) 0.4 MG SL tablet Place 1 tablet (0.4 mg  total) under the tongue every 5 (five) minutes as needed for chest pain. 06/10/21 06/10/22  Cheryln Manly, NP  pantoprazole (PROTONIX) 40 MG tablet Take 1 tablet (40 mg total) by mouth daily. 09/22/21   Laurey Morale, MD  potassium chloride (KLOR-CON 10) 10 MEQ tablet Take 1 tablet (10 mEq total) by mouth daily. 06/11/21   Laurey Morale, MD  ticagrelor (BRILINTA) 90 MG TABS tablet Take 1 tablet (90 mg total) by mouth 2 (two) times daily. 06/23/21   Lenna Sciara, NP  vitamin C (ASCORBIC ACID) 500 MG tablet Take 500 mg by mouth daily. Reported on 10/27/2015    [provider]      Allergies    Contrast media [iodinated contrast media]    Review of Systems   Review of Systems  Constitutional:  Positive for fatigue and fever.  Neurological:  Positive for weakness.    Physical Exam Updated Vital Signs BP (!) 121/54   Pulse 64   Temp 98.9 F (37.2 C) (Oral)   Resp 20   SpO2 92%  Physical Exam Vitals and nursing note reviewed.  Constitutional:      General: He is not in acute distress.    Appearance: He is well-developed.  HENT:     Head: Normocephalic and atraumatic.  Eyes:     Conjunctiva/sclera: Conjunctivae normal.  Cardiovascular:     Rate and Rhythm: Normal rate and regular rhythm.  Pulmonary:     Effort: Pulmonary effort  is normal. No respiratory distress.     Breath sounds: Normal breath sounds.  Abdominal:     Palpations: Abdomen is soft.     Tenderness: There is no abdominal tenderness.  Musculoskeletal:        General: No swelling.     Cervical back: Neck supple.  Skin:    General: Skin is warm and dry.     Capillary Refill: Capillary refill takes less than 2 seconds.  Neurological:     Mental Status: He is alert.  Psychiatric:        Mood and Affect: Mood normal.     ED Results / Procedures / Treatments   Labs (all labs ordered are listed, but only abnormal results are displayed) Labs Reviewed  CBC WITH DIFFERENTIAL/PLATELET - Abnormal;  Notable for the following components:      Result Value   RBC 3.84 (*)    Hemoglobin 12.3 (*)    HCT 35.9 (*)    Monocytes Absolute 1.2 (*)    All other components within normal limits  BASIC METABOLIC PANEL - Abnormal; Notable for the following components:   Sodium 132 (*)    CO2 18 (*)    Glucose, Bld 116 (*)    Calcium 8.5 (*)    All other components within normal limits  TROPONIN I (HIGH SENSITIVITY) - Abnormal; Notable for the following components:   Troponin I (High Sensitivity) 29 (*)    All other components within normal limits  RESP PANEL BY RT-PCR (FLU A&B, COVID) [id number]  MAGNESIUM  URINALYSIS, ROUTINE W REFLEX MICROSCOPIC  TROPONIN I (HIGH SENSITIVITY)    EKG EKG Interpretation  Date/Time:  Monday November 02 2021 16:21:53 EDT Ventricular Rate:  51 PR Interval:  253 QRS Duration: 89 QT Interval:  413 QTC Calculation: 381 R Axis:   6 Text Interpretation: Sinus bradycardia Prolonged PR interval Probable anteroseptal infarct, old Reconfirmed by Regan Lemming ([id number]) on 11/02/2021 6:31:56 PM  Radiology DG Chest Portable 1 View  Result Date: 11/02/2021 CLINICAL DATA:  COVID.  Weakness, fever and chills. EXAM: PORTABLE CHEST 1 VIEW COMPARISON:  Chest radiograph 06/07/2021 FINDINGS: Patchy and heterogeneous bilateral lower lung zone opacities, left greater than right. Lower lung volumes from prior exam. Upper normal heart size. Aortic atherosclerosis. No pulmonary edema, large pleural effusion, or pneumothorax. No acute osseous findings. IMPRESSION: Patchy and heterogeneous bilateral lower lung zone opacities, left greater than right, suspicious for pneumonia in the setting of COVID. Electronically Signed   By: Keith Rake M.D.   On: 11/02/2021 17:09    Procedures Procedures    Medications Ordered in ED Medications  dexamethasone (DECADRON) injection 10 mg (has no administration in time range)    ED Course/ Medical Decision Making/ A&P Clinical Course as of  11/02/21 1833  Mon Nov 02, 2021  1827 Troponin I (High Sensitivity)(!): 29 [JL]    Clinical Course User Index [JL] Regan Lemming, MD                           Medical Decision Making Amount and/or Complexity of Data Reviewed Labs: ordered. Decision-making details documented in ED Course. Radiology: ordered.  Risk Prescription drug management. Decision regarding hospitalization.   88 year old male with medical history significant for STEMI status post revascularization in February 2023 with successful PCI to the mid and distal LAD with drug-eluting stent placement, on aspirin and Brilinta who presents to the emergency department with fatigue and weakness over the past  day.  The patient fell this morning was having overall generalized weakness.  No head trauma or loss of consciousness.  Did have an episode of incontinence.  Has had low-grade fevers at home.  Was tested after his fall at his facility and was found to be incidentally COVID-positive.  He was then transported to the [facility] long emergency department for further management.  On arrival, he complained of diffuse weakness, no focal weakness, generalized fatigue.  He denied any chest pain or shortness of breath.  He denied any dysuria or abdominal pain.  On arrival, the patient was hypoxic, O2 sats between 85 and 92% while resting in bed, subsequently placed on 2 L O2 via nasal cannula.  The patient was afebrile, temperature 98.9, not tachycardic P64, sinus bradycardia incidentally noted on cardiac telemetry.  An EKG was performed which revealed sinus bradycardia, ventricular rate 5 1, prolonged PR, no acute ischemic changes.  Chest x-ray was performed and revealed a multifocal pneumonia compatible with the patient's recent diagnosis of COVID-19.  Laboratory evaluation significant for CBC without a leukocytosis, mild anemia, 0.3, magnesium 1.9, initial troponin elevated to 29, BMP without significant electrolyte abnormality, mild  non-anion gap acidosis with a bicarbonate of 18, anion gap 9, sodium 132, blood glucose 116.  Repeat troponin was ordered, urinalysis was ordered and a COVID-19 and influenza PCR testing was ordered to confirm.  Given the patient's hypoxic respiratory failure in the setting of COVID-19, Decadron IV was ordered.  Low concern for ACS at this time as the patient denies any nausea, chest pain or shortness of breath.  Repeat troponin pending.  No evidence of acute ischemia noted on EKG.  Plan to follow-up urinalysis, troponin results inpatient.  Dr. Roel Cluck consulted for admission and accepted the patient admission.   Final Clinical Impression(s) / ED Diagnoses Final diagnoses:  Weakness  COVID-19  Acute respiratory failure with hypoxia [facility])    Rx / DC Orders ED Discharge Orders     None         Regan Lemming, MD 11/02/21 [phone]

## 2021-11-02 NOTE — Telephone Encounter (Signed)
Gabriel [name] with triage nurse is calling to let md know the pt will be going to ER

## 2021-11-02 NOTE — Telephone Encounter (Signed)
[facility]. Patient fell this morning and having overall weekness. Normally is able to do everything himself, but is unable to trnasfer himself, dress himself or make it to bathroom. Did have an incontinence episode.   Persina and patient were sent to triage nurse who recommend he head to the hospital, but they will need an order faxed over (so they can have it on file) from Dr.Fry to get him to hospital.      Good fax number is [phone]  Good callback for [facility] ext [phone]   Please advise

## 2021-11-02 NOTE — Assessment & Plan Note (Signed)
Treated with remdesivir and steroids given hypoxia and evidence of infiltrates on chest x-ray. Obtain inflammatory markers. Prone as needed oxygen as needed

## 2021-11-02 NOTE — Assessment & Plan Note (Signed)
Monitor fluid output monitor for any sign of obstruction

## 2021-11-02 NOTE — Assessment & Plan Note (Addendum)
this patient has acute respiratory failure with Hypoxia  as documented by the presence of following: O2 saturatio< 90% on RA Likely due to:   COVID pneumonia Provide O2 therapy and titrate as needed  Continuous pulse ox  check Pulse ox with ambulation prior to discharge   may need  TC consult for home O2 set up  Prone if able  flutter valve ordered  Given hypoxia and elevated D-dimer we will check CTA patient will need to be premedicated for the

## 2021-11-02 NOTE — Assessment & Plan Note (Signed)
Rehydrate and recheck in a.m.

## 2021-11-02 NOTE — Assessment & Plan Note (Signed)
Continue Protonix 40 mg daily

## 2021-11-02 NOTE — Subjective & Objective (Signed)
Complaining of fatigue for the past day had a fall this morning did not hit his head no LOC.  He had an episode of incontinence associated that is also has been having low-grade fevers incidentally at the facility was found to be COVID-positive.  Transported to [facility] patient has known history of CAD but no chest pain or shortness of breath no dysuria abdominal pain

## 2021-11-02 NOTE — Assessment & Plan Note (Signed)
Obtain urine electrolytes and follow closely avoid over aggressive fluid rehydration in setting of COVID

## 2021-11-02 NOTE — ED Notes (Signed)
ED TO INPATIENT HANDOFF REPORT  Name/Age/Gender Gabriel [name] 88 y.o. male  Code Status    Code Status Orders  (From admission, onward)           Start     Ordered   11/02/21 1950  Full code  Continuous        11/02/21 1952           Code Status History     Date Active Date Inactive Code Status Order ID Comments User Context   06/07/2021 0024 06/10/2021 1820 Full Code [id number]  [name], MD Inpatient       Home/SNF/Other Independent Living  Chief Complaint Acute respiratory failure due to COVID-19 ([place]) [U07.1, J96.00]  Level of Care/Admitting Diagnosis ED Disposition     ED Disposition  Admit   Condition  --   [place]: [place] [[id number]]  Level of Care: Progressive [102]  Admit to Progressive based on following criteria: CARDIOVASCULAR & THORACIC of moderate stability with acute coronary syndrome symptoms/low risk myocardial infarction/hypertensive urgency/arrhythmias/heart failure potentially compromising stability and stable post cardiovascular intervention patients.  Admit to Progressive based on following criteria: RESPIRATORY PROBLEMS hypoxemic/hypercapnic respiratory failure that is responsive to NIPPV (BiPAP) or High Flow Nasal Cannula (6-80 lpm). Frequent assessment/intervention, no > Q2 hrs < Q4 hrs, to maintain oxygenation and pulmonary hygiene.  May admit patient to [facility] or [facility] if equivalent level of care is available:: No  Covid Evaluation: Confirmed COVID Positive  Diagnosis: Acute respiratory failure due to COVID-19 [facility]) [[id number]]  Admitting Physician: Toy Baker [[id number]]  Attending Physician: Toy Baker [[id number]]  Certification:: I certify this patient will need inpatient services for at least 2 midnights  Estimated Length of Stay: 2          Medical History Past Medical History:  Diagnosis Date   Arthritis    Diverticulosis of colon    External thrombosed  hemorrhoids 06/11/2009   Qualifier: Diagnosis of  By: Sarajane Jews MD, Annie Main A    GERD (gastroesophageal reflux disease)    Hypertension     Allergies Allergies  Allergen Reactions   Contrast Media [Iodinated Contrast Media] Rash    IV Location/Drains/Wounds Patient Lines/Drains/Airways Status     Active Line/Drains/Airways     Name Placement date Placement time Site Days   Peripheral IV 11/02/21 20 G Left Antecubital 11/02/21  1700  Antecubital  less than 1            Labs/Imaging Results for orders placed or performed during the hospital encounter of 11/02/21 (from the past 48 hour(s))  CBC with Differential     Status: Abnormal   Collection Time: 11/02/21  4:40 PM  Result Value Ref Range   WBC 7.0 4.0 - 10.5 K/uL   RBC 3.84 (L) 4.22 - 5.81 MIL/uL   Hemoglobin 12.3 (L) 13.0 - 17.0 g/dL   HCT 35.9 (L) 39.0 - 52.0 %   MCV 93.5 80.0 - 100.0 fL   MCH 32.0 26.0 - 34.0 pg   MCHC 34.3 30.0 - 36.0 g/dL   RDW 13.8 11.5 - 15.5 %   Platelets 177 150 - 400 K/uL   nRBC 0.0 0.0 - 0.2 %   Neutrophils Relative % 67 %   Neutro Abs 4.7 1.7 - 7.7 K/uL   Lymphocytes Relative 14 %   Lymphs Abs 1.0 0.7 - 4.0 K/uL   Monocytes Relative 17 %   Monocytes Absolute 1.2 (H) 0.1 - 1.0 K/uL  Eosinophils Relative 0 %   Eosinophils Absolute 0.0 0.0 - 0.5 K/uL   Basophils Relative 1 %   Basophils Absolute 0.1 0.0 - 0.1 K/uL   Immature Granulocytes 1 %   Abs Immature Granulocytes 0.04 0.00 - 0.07 K/uL    Comment: Performed at [facility], [place] [address], [place], [place] [address]  Basic metabolic panel     Status: Abnormal   Collection Time: 11/02/21  4:40 PM  Result Value Ref Range   Sodium 132 (L) 135 - 145 mmol/L   Potassium 3.7 3.5 - 5.1 mmol/L   Chloride 105 98 - 111 mmol/L   CO2 18 (L) 22 - 32 mmol/L   Glucose, Bld 116 (H) 70 - 99 mg/dL    Comment: Glucose reference range applies only to samples taken after fasting for at least 8 hours.   BUN 18 8 - 23 mg/dL    Creatinine, Ser 0.72 0.61 - 1.24 mg/dL   Calcium 8.5 (L) 8.9 - 10.3 mg/dL   GFR, Estimated >60 >60 mL/min    Comment: (NOTE) Calculated using the CKD-EPI Creatinine Equation (2021)    Anion gap 9 5 - 15    Comment: Performed at [facility], [place] [address] [place], [place] [address]  Magnesium     Status: None   Collection Time: 11/02/21  4:40 PM  Result Value Ref Range   Magnesium 1.9 1.7 - 2.4 mg/dL    Comment: Performed at [facility], [place] [address], [place], [place] [address]  Troponin I (High Sensitivity)     Status: Abnormal   Collection Time: 11/02/21  4:40 PM  Result Value Ref Range   Troponin I (High Sensitivity) 29 (H) <18 ng/L    Comment: (NOTE) Elevated high sensitivity troponin I (hsTnI) values and significant  changes across serial measurements may suggest ACS but many other  chronic and acute conditions are known to elevate hsTnI results.  Refer to the "Links" section for chest pain algorithms and additional  guidance. Performed at [facility], [place] [address], [place], [place] [address]   Troponin I (High Sensitivity)     Status: Abnormal   Collection Time: 11/02/21  6:55 PM  Result Value Ref Range   Troponin I (High Sensitivity) 18 (H) <18 ng/L    Comment: (NOTE) Elevated high sensitivity troponin I (hsTnI) values and significant  changes across serial measurements may suggest ACS but many other  chronic and acute conditions are known to elevate hsTnI results.  Refer to the "Links" section for chest pain algorithms and additional  guidance. Performed at [facility], [place] [address], [place], [place] [address]    DG Chest Portable 1 View  Result Date: 11/02/2021 CLINICAL DATA:  COVID.  Weakness, fever and chills. EXAM: PORTABLE CHEST 1 VIEW COMPARISON:  Chest radiograph 06/07/2021 FINDINGS: Patchy and heterogeneous bilateral lower lung zone opacities, left greater than right. Lower  lung volumes from prior exam. Upper normal heart size. Aortic atherosclerosis. No pulmonary edema, large pleural effusion, or pneumothorax. No acute osseous findings. IMPRESSION: Patchy and heterogeneous bilateral lower lung zone opacities, left greater than right, suspicious for pneumonia in the setting of COVID. Electronically Signed   By: Keith Rake M.D.   On: 11/02/2021 17:09    Pending Labs Unresulted Labs (From admission, onward)     Start     Ordered   11/03/21 0500  CBC with Differential/Platelet  Daily at 5am,   R  11/02/21 1952   11/03/21 0500  Comprehensive metabolic panel  Daily at 5am,   R      11/02/21 1952   11/03/21 0500  C-reactive protein  Daily at 5am,   R      11/02/21 1952   11/03/21 0500  D-dimer, quantitative  Daily at 5am,   R      11/02/21 1952   11/03/21 0500  Ferritin  Daily at 5am,   R      11/02/21 1952   11/03/21 0500  Magnesium  Daily at 5am,   R      11/02/21 1952   11/03/21 0500  Phosphorus  Daily at 5am,   R      11/02/21 1952   11/02/21 1954  Blood gas, venous  ONCE - STAT,   R       Question:  Release to patient  Answer:  Immediate   11/02/21 1953   11/02/21 1954  CK  Add-on,   AD        11/02/21 1953   11/02/21 1954  Creatinine, urine, random  Once,   R        11/02/21 1953   11/02/21 1954  Magnesium  Add-on,   AD        11/02/21 1953   11/02/21 1954  Phosphorus  Add-on,   AD        11/02/21 1953   11/02/21 1954  Osmolality  Add-on,   AD        11/02/21 1953   11/02/21 1954  Osmolality, urine  Once,   R        11/02/21 1953   11/02/21 1954  Sodium, urine, random  Once,   R        11/02/21 1953   11/02/21 1954  TSH  Add-on,   AD        11/02/21 1953   11/02/21 1954  Urinalysis, Complete w Microscopic  Once,   R       Question:  Release to patient  Answer:  Immediate   11/02/21 1953   11/02/21 1951  Brain natriuretic peptide  Add-on,   AD        11/02/21 1952   11/02/21 1951  C-reactive protein  Add-on,   AD        11/02/21  1952   11/02/21 1951  Comprehensive metabolic panel  Add-on,   AD        11/02/21 1952   11/02/21 1951  D-dimer, quantitative  Add-on,   AD        11/02/21 1952   11/02/21 1951  Ferritin  Add-on,   AD        11/02/21 1952   11/02/21 1951  Procalcitonin  Add-on,   AD        11/02/21 1952   11/02/21 1951  Lactate dehydrogenase  Add-on,   AD        11/02/21 1952   11/02/21 1828  Urinalysis, Routine w reflex microscopic Anterior Nasal Swab  Once,   URGENT        11/02/21 1827   11/02/21 1808  Resp Panel by RT-PCR (Flu A&B, Covid) Anterior Nasal Swab  (Tier 2 - Symptomatic Resp Panel by RT-PCR (Flu A&B, Covid))  Once,   URGENT        11/02/21 1807   Signed and Held  Comprehensive metabolic panel  Tomorrow morning,   R       Question:  Release to patient  Answer:  Immediate   Signed and Held   Signed and Held  CBC  Tomorrow morning,   R       Question:  Release to patient  Answer:  Immediate   Signed and Held            Vitals/Pain Today's Vitals   11/02/21 1900 11/02/21 1915 11/02/21 1930 11/02/21 1932  BP: (!) 149/63 (!) 141/95  (!) 141/95  Pulse:  (!) 115 82 82  Resp:    17  Temp:      TempSrc:      SpO2: 100% 90% 94% 95%  PainSc:        Isolation Precautions Airborne and Contact precautions  Medications Medications  sodium chloride flush (NS) 0.9 % injection 3 mL (has no administration in time range)  sodium chloride flush (NS) 0.9 % injection 3 mL (has no administration in time range)  sodium chloride flush (NS) 0.9 % injection 3 mL (has no administration in time range)  0.9 %  sodium chloride infusion (has no administration in time range)  guaiFENesin-dextromethorphan (ROBITUSSIN DM) 100-10 MG/5ML syrup 10 mL (has no administration in time range)  acetaminophen (TYLENOL) tablet 650 mg (has no administration in time range)  HYDROcodone-acetaminophen (NORCO/VICODIN) 5-325 MG per tablet 1-2 tablet (has no administration in time range)  ondansetron (ZOFRAN) tablet 4 mg  (has no administration in time range)    Or  ondansetron (ZOFRAN) injection 4 mg (has no administration in time range)  remdesivir 200 mg in sodium chloride 0.9% 250 mL IVPB (has no administration in time range)    Followed by  remdesivir 100 mg in sodium chloride 0.9 % 100 mL IVPB (has no administration in time range)  methylPREDNISolone sodium succinate (SOLU-MEDROL) injection 0.5 mg/kg (has no administration in time range)    Followed by  predniSONE (DELTASONE) tablet 50 mg (has no administration in time range)  dexamethasone (DECADRON) injection 10 mg (10 mg Intravenous Given 11/02/21 1901)    Mobility walks with person assist

## 2021-11-02 NOTE — Assessment & Plan Note (Addendum)
Not on anticoagulation  We will hold metoprolol given bradycardia

## 2021-11-02 NOTE — ED Triage Notes (Signed)
Patient BIB EMS from [facility] with weakness. Per report patient fell this morning and was found to have fever and chills. Per report patient had tylenol and cough medicine at the facility. Per report pt was positive for covid at the facility. Patient denies N/V/D. Pt A/Ox4.  BP 102/44 HR 75 RR 20 O2sat 95% on RA

## 2021-11-02 NOTE — Assessment & Plan Note (Signed)
Elevated D-dimer leg edema left versus right but both bilaterally.  We will check Dopplers

## 2021-11-02 NOTE — Assessment & Plan Note (Addendum)
Hold metoprolol fo rnow

## 2021-11-02 NOTE — Assessment & Plan Note (Signed)
-  Continue Lipitor 40 mg daily ?

## 2021-11-03 ENCOUNTER — Inpatient Hospital Stay (HOSPITAL_COMMUNITY): Payer: Medicare Other

## 2021-11-03 ENCOUNTER — Encounter (HOSPITAL_COMMUNITY): Payer: Self-pay | Admitting: Internal Medicine

## 2021-11-03 DIAGNOSIS — R609 Edema, unspecified: Secondary | ICD-10-CM | POA: Diagnosis not present

## 2021-11-03 DIAGNOSIS — U071 COVID-19: Secondary | ICD-10-CM | POA: Diagnosis not present

## 2021-11-03 DIAGNOSIS — J9600 Acute respiratory failure, unspecified whether with hypoxia or hypercapnia: Secondary | ICD-10-CM | POA: Diagnosis not present

## 2021-11-03 DIAGNOSIS — R778 Other specified abnormalities of plasma proteins: Secondary | ICD-10-CM | POA: Diagnosis not present

## 2021-11-03 DIAGNOSIS — R0603 Acute respiratory distress: Secondary | ICD-10-CM

## 2021-11-03 LAB — MAGNESIUM
Magnesium: 1.3 mg/dL — ABNORMAL LOW (ref 1.7–2.4)
Magnesium: 1.9 mg/dL (ref 1.7–2.4)

## 2021-11-03 LAB — BASIC METABOLIC PANEL
Anion gap: 7 (ref 5–15)
BUN: 16 mg/dL (ref 8–23)
CO2: 21 mmol/L — ABNORMAL LOW (ref 22–32)
Calcium: 8.3 mg/dL — ABNORMAL LOW (ref 8.9–10.3)
Chloride: 104 mmol/L (ref 98–111)
Creatinine, Ser: 0.52 mg/dL — ABNORMAL LOW (ref 0.61–1.24)
GFR, Estimated: >60 mL/min (ref >60)
Glucose, Bld: 162 mg/dL — ABNORMAL HIGH (ref 70–99)
Potassium: 4 mmol/L (ref 3.5–5.1)
Sodium: 132 mmol/L — ABNORMAL LOW (ref 135–145)

## 2021-11-03 LAB — COMPREHENSIVE METABOLIC PANEL
ALT: 24 U/L (ref 0–44)
ALT: 39 U/L (ref 0–44)
AST: 45 U/L — ABNORMAL HIGH (ref 15–41)
AST: 61 U/L — ABNORMAL HIGH (ref 15–41)
Albumin: 1.8 g/dL — ABNORMAL LOW (ref 3.5–5.0)
Albumin: 3.1 g/dL — ABNORMAL LOW (ref 3.5–5.0)
Alkaline Phosphatase: 52 U/L (ref 38–126)
Alkaline Phosphatase: 83 U/L (ref 38–126)
Anion gap: 7 (ref 5–15)
Anion gap: 8 (ref 5–15)
BUN: 11 mg/dL (ref 8–23)
BUN: 16 mg/dL (ref 8–23)
CO2: 12 mmol/L — ABNORMAL LOW (ref 22–32)
CO2: 21 mmol/L — ABNORMAL LOW (ref 22–32)
Calcium: 5.6 mg/dL — CL (ref 8.9–10.3)
Calcium: 8.3 mg/dL — ABNORMAL LOW (ref 8.9–10.3)
Chloride: 120 mmol/L — ABNORMAL HIGH (ref 98–111)
Chloride: 104 mmol/L (ref 98–111)
Creatinine, Ser: 0.36 mg/dL — ABNORMAL LOW (ref 0.61–1.24)
Creatinine, Ser: 0.51 mg/dL — ABNORMAL LOW (ref 0.61–1.24)
GFR, Estimated: >60 mL/min (ref >60)
GFR, Estimated: >60 mL/min (ref >60)
Glucose, Bld: 97 mg/dL (ref 70–99)
Glucose, Bld: 155 mg/dL — ABNORMAL HIGH (ref 70–99)
Potassium: 2.4 mmol/L — CL (ref 3.5–5.1)
Potassium: 4.1 mmol/L (ref 3.5–5.1)
Sodium: 139 mmol/L (ref 135–145)
Sodium: 133 mmol/L — ABNORMAL LOW (ref 135–145)
Total Bilirubin: 1.3 mg/dL — ABNORMAL HIGH (ref 0.3–1.2)
Total Bilirubin: 1.5 mg/dL — ABNORMAL HIGH (ref 0.3–1.2)
Total Protein: 3.7 g/dL — ABNORMAL LOW (ref 6.5–8.1)
Total Protein: 5.9 g/dL — ABNORMAL LOW (ref 6.5–8.1)

## 2021-11-03 LAB — CBC
HCT: 35.6 % — ABNORMAL LOW (ref 39.0–52.0)
Hemoglobin: 12.1 g/dL — ABNORMAL LOW (ref 13.0–17.0)
MCH: 31.3 pg (ref 26.0–34.0)
MCHC: 34 g/dL (ref 30.0–36.0)
MCV: 92 fL (ref 80.0–100.0)
Platelets: 181 10*3/uL (ref 150–400)
RBC: 3.87 MIL/uL — ABNORMAL LOW (ref 4.22–5.81)
RDW: 13.6 % (ref 11.5–15.5)
WBC: 12 10*3/uL — ABNORMAL HIGH (ref 4.0–10.5)
nRBC: 0 % (ref 0.0–0.2)

## 2021-11-03 LAB — PHOSPHORUS: Phosphorus: 4.4 mg/dL (ref 2.5–4.6)

## 2021-11-03 LAB — CBC WITH DIFFERENTIAL/PLATELET
Abs Immature Granulocytes: 0.02 10*3/uL (ref 0.00–0.07)
Basophils Absolute: 0 10*3/uL (ref 0.0–0.1)
Basophils Relative: 0 %
Eosinophils Absolute: 0 10*3/uL (ref 0.0–0.5)
Eosinophils Relative: 0 %
HCT: 37 % — ABNORMAL LOW (ref 39.0–52.0)
Hemoglobin: 12.7 g/dL — ABNORMAL LOW (ref 13.0–17.0)
Immature Granulocytes: 1 %
Lymphocytes Relative: 18 %
Lymphs Abs: 0.7 10*3/uL (ref 0.7–4.0)
MCH: 31.8 pg (ref 26.0–34.0)
MCHC: 34.3 g/dL (ref 30.0–36.0)
MCV: 92.7 fL (ref 80.0–100.0)
Monocytes Absolute: 0.1 10*3/uL (ref 0.1–1.0)
Monocytes Relative: 4 %
Neutro Abs: 2.9 10*3/uL (ref 1.7–7.7)
Neutrophils Relative %: 77 %
Platelets: 163 10*3/uL (ref 150–400)
RBC: 3.99 MIL/uL — ABNORMAL LOW (ref 4.22–5.81)
RDW: 13.6 % (ref 11.5–15.5)
WBC: 3.8 10*3/uL — ABNORMAL LOW (ref 4.0–10.5)
nRBC: 0 % (ref 0.0–0.2)

## 2021-11-03 LAB — ECHOCARDIOGRAM COMPLETE
AR max vel: 2.24 cm2
AV Peak grad: 4.8 mmHg
Ao pk vel: 1.09 m/s
Area-P 1/2: 2.72 cm2
Calc EF: 62.2 %
Height: 67 in
P 1/2 time: 721 msec
S' Lateral: 2.4 cm
Single Plane A2C EF: 63.1 %
Single Plane A4C EF: 62.9 %
Weight: 2504.43 oz

## 2021-11-03 LAB — TROPONIN I (HIGH SENSITIVITY)
Troponin I (High Sensitivity): 14 ng/L (ref <18)
Troponin I (High Sensitivity): 22 ng/L — ABNORMAL HIGH (ref <18)
Troponin I (High Sensitivity): 15 ng/L (ref <18)

## 2021-11-03 LAB — D-DIMER, QUANTITATIVE: D-Dimer, Quant: 1.4 ug/mL-FEU — ABNORMAL HIGH (ref 0.00–0.50)

## 2021-11-03 LAB — C-REACTIVE PROTEIN: CRP: 6.1 mg/dL — ABNORMAL HIGH (ref <1.0)

## 2021-11-03 LAB — BRAIN NATRIURETIC PEPTIDE: B Natriuretic Peptide: 368.7 pg/mL — ABNORMAL HIGH (ref 0.0–100.0)

## 2021-11-03 LAB — CK: Total CK: 382 U/L (ref 49–397)

## 2021-11-03 LAB — FERRITIN: Ferritin: 256 ng/mL (ref 24–336)

## 2021-11-03 LAB — PROTIME-INR
INR: 1.2 (ref 0.8–1.2)
Prothrombin Time: 15.4 seconds — ABNORMAL HIGH (ref 11.4–15.2)

## 2021-11-03 LAB — MRSA NEXT GEN BY PCR, NASAL: MRSA by PCR Next Gen: NOT DETECTED

## 2021-11-03 LAB — APTT: aPTT: 36 seconds (ref 24–36)

## 2021-11-03 LAB — HEPARIN LEVEL (UNFRACTIONATED): Heparin Unfractionated: 0.55 IU/mL (ref 0.30–0.70)

## 2021-11-03 MED ORDER — SODIUM CHLORIDE (PF) 0.9 % IJ SOLN
INTRAMUSCULAR | Status: AC
  Filled 2021-11-03: qty 50

## 2021-11-03 MED ORDER — GUAIFENESIN ER 600 MG PO TB12
600.0000 mg | ORAL_TABLET | Freq: Two times a day (BID) | ORAL | Status: DC | PRN
  Administered 2021-11-04 – 2021-11-05 (×2): 600 mg via ORAL
  Filled 2021-11-03 (×2): qty 1

## 2021-11-03 MED ORDER — IOHEXOL 350 MG/ML SOLN
75.0000 mL | Freq: Once | INTRAVENOUS | Status: AC | PRN
Start: 2021-11-03 — End: 2021-11-03
  Administered 2021-11-03: 75 mL via INTRAVENOUS

## 2021-11-03 MED ORDER — HEPARIN (PORCINE) 25000 UT/250ML-% IV SOLN
1100.0000 [IU]/h | INTRAVENOUS | Status: DC
  Administered 2021-11-03: 1100 [IU]/h via INTRAVENOUS
  Filled 2021-11-03: qty 250

## 2021-11-03 MED ORDER — METHYLPREDNISOLONE SODIUM SUCC 40 MG IJ SOLR
40.0000 mg | Freq: Two times a day (BID) | INTRAMUSCULAR | Status: DC
  Administered 2021-11-03 – 2021-11-04 (×2): 40 mg via INTRAVENOUS
  Filled 2021-11-03 (×2): qty 1

## 2021-11-03 MED ORDER — PREDNISONE 50 MG PO TABS: 50.0000 mg | ORAL_TABLET | Freq: Every day | ORAL | Status: DC

## 2021-11-03 MED ORDER — POTASSIUM CHLORIDE CRYS ER 20 MEQ PO TBCR
40.0000 meq | EXTENDED_RELEASE_TABLET | Freq: Once | ORAL | Status: AC
Start: 2021-11-03 — End: 2021-11-03
  Administered 2021-11-03: 40 meq via ORAL
  Filled 2021-11-03: qty 2

## 2021-11-03 MED ORDER — "THROMBI-PAD 3""X3"" EX PADS"
1.0000 | MEDICATED_PAD | Freq: Once | CUTANEOUS | Status: AC
  Administered 2021-11-03: 1 via TOPICAL
  Filled 2021-11-03: qty 1

## 2021-11-03 MED ORDER — HEPARIN BOLUS VIA INFUSION
2000.0000 [IU] | Freq: Once | INTRAVENOUS | Status: AC
  Administered 2021-11-03: 2000 [IU] via INTRAVENOUS
  Filled 2021-11-03: qty 2000

## 2021-11-03 NOTE — Progress Notes (Signed)
Patient accidentally pulled IV line out of Right Antecubital site. Patient on Heparin Drip Pressure held to site and dressing applied.

## 2021-11-03 NOTE — Progress Notes (Signed)
Patient continues to bleed from right antecubital site. Rapid Response Nurse called to assist and holding pressure to area. MD updated via phone and Thrombi-Pad applied to the site. Heparin Drip to be discontinued per MD. Monitor Patient and site closely.

## 2021-11-03 NOTE — Progress Notes (Signed)
Dressing to Right Forearm with Thrombi patch and dressing remains dry at this time

## 2021-11-03 NOTE — Progress Notes (Signed)
Gabriel [name]  BZJ:[id number] DOB: [birth year] DOA: 11/02/2021 PCP: Laurey Morale, MD    Brief Narrative:  88 year old with a history of CAD, HTN, BPH, chronic dysphagia, paroxysmal atrial fibrillation, and ischemic cardiomyopathy who presented to the ER with generalized weakness over a 24-hour period and a mechanical fall.  He had been experiencing low-grade fevers at his facility and was found to be COVID-positive.  Consultants:  None  Goals of Care:  Code Status: Full Code   DVT prophylaxis: IV heparin  Interim Hx: Alert conversant and in good spirits at time of my evaluation.  Denies chest pain nausea vomiting or even shortness of breath.  No abdominal pain.  Does report recent unilateral lower extremity edema which appears to have much improved over the last 24 hours.  No known sick contacts.  Assessment & Plan:  COVID pneumonia - acute hypoxic resp failure ruled out  Infiltrates noted on CXR, but I can find no clear documentation of actual respiratory distress or hypoxia - stop remdesivir as I do not feel he has a severe CoViD PNA, and I do not think this will be of significant benefit to him - stop steroid after 3 days of use if resp status remains stable - Paxlovid not appropriate as pt will need DOAC for tx of PE - no role for actemra/baricitinib given mild clinical case  Recent Labs  Lab 11/02/21 1648 11/02/21 2130 11/03/21 0537  DDIMER 2.21*  --  1.40*  FERRITIN 228  --  256  CRP 4.8*  --   --   ALT  --  24 75  PROCALCITON <0.10  --   --     Right lobar and segmental acute pulmonary emboli Was being anticoagulated with IV heparin but then he dislodged his own IV accidentally and suffered prolonged difficult to control bleeding -given that PEs are submassive we are holding anticoagulation for the rest of today in the interest of preventing further hemorrhage and will resume in the a.m. with either Lovenox, DOAC, or heparin drip depending upon his clinical stability  -given advanced age and risk of bleeding as well as need for aspirin and Brilinta we will treat for a 14-monthcourse only with DOAC  Hyponatremia Due to volume depletion -continue volume expansion and follow  Sinus bradycardia Beta-blocker on hold  Edema of left lower extremity Venous duplex without evidence of clot - suspect clot was present previously based on clinical history and has now migrated to lungs -it is reassuring there is no persistent clot at present  CAD status post DES to mid LAD Last cardiac cath February 2023 -continue with aspirin, Brilinta, statin, beta-blocker -stable at this time  Chronic paroxysmal atrial fibrillation Not on anticoagulation due to need for aspirin p.o. Brilinta and high risk of triple therapy in patient with advanced age   Chronic combined systolic and diastolic CHF TTE February 2023 noted EF 40-45% -TTE this admission notes EF improved to 65-70% with no wall motion abnormalities but persisting grade 2 diastolic CHF -no evidence of RV dysfunction  HTN resume metoprolol as blood pressure and heart rate allow  HLD Continue Lipitor  Family Communication: Spoke with daughter at bedside Disposition: Lives in a facility -should be able to return to the same facility depending upon COVID restrictions there   Objective: Blood pressure (!) 151/63, pulse (!) 50, temperature (!) 97.3 F (36.3 C), resp. rate 18, height '5\' 7"'$  (1.702 m), weight 71 kg, SpO2 97 %.  Intake/Output Summary (Last 24  hours) at 11/03/2021 0160 Last data filed at 11/03/2021 0601 Gross per 24 hour  Intake --  Output 900 ml  Net -900 ml   Filed Weights   11/02/21 2200 11/03/21 0844  Weight: 71 kg 71 kg    Examination: General: No acute respiratory distress Lungs: Clear to auscultation bilaterally without wheezes or crackles Cardiovascular: Mildly bradycardic but regular without murmur or rub Abdomen: Nontender, nondistended, soft, bowel sounds positive, no rebound, no  ascites, no appreciable mass Extremities: No significant cyanosis, clubbing, or edema bilateral lower extremities  CBC: Recent Labs  Lab 11/02/21 1640 11/03/21 0537  WBC 7.0 3.8*  NEUTROABS 4.7 2.9  HGB 12.3* 12.7*  HCT 35.9* 37.0*  MCV 93.5 92.7  PLT 177 109   Basic Metabolic Panel: Recent Labs  Lab 11/02/21 1640 11/02/21 1648 11/02/21 2130 11/03/21 0537  NA 132*  --  139 133*  132*  K 3.7  --  2.4* 4.1  4.0  CL 105  --  120* 104  104  CO2 18*  --  12* 21*  21*  GLUCOSE 116*  --  97 155*  162*  BUN 18  --  '11 16  16  '$ CREATININE 0.72  --  0.36* 0.51*  0.52*  CALCIUM 8.5*  --  5.6* 8.3*  8.3*  MG 1.9  --  1.3* 1.9  PHOS  --  3.3  --  4.4   GFR: Estimated Creatinine Clearance: 51.6 mL/min (A) (by C-G formula based on SCr of 0.51 mg/dL (L)).  Liver Function Tests: Recent Labs  Lab 11/02/21 2130 11/03/21 0537  AST 45* 61*  ALT 24 39  ALKPHOS 52 83  BILITOT 1.3* 1.5*  PROT 3.7* 5.9*  ALBUMIN 1.8* 3.1*     Coagulation Profile: Recent Labs  Lab 11/03/21 0537  INR 1.2    Scheduled Meds:  aspirin  81 mg Oral Daily   atorvastatin  40 mg Oral Daily   gabapentin  300 mg Oral TID   guaiFENesin  600 mg Oral BID   methylPREDNISolone (SOLU-MEDROL) injection  0.5 mg/kg Intravenous Q12H   Followed by   Derrill Memo ON 11/06/2021] predniSONE  50 mg Oral Daily   pantoprazole  40 mg Oral Daily   sodium chloride (PF)       sodium chloride flush  3 mL Intravenous Q12H   sodium chloride flush  3 mL Intravenous Q12H   sodium chloride flush  3 mL Intravenous Q12H   ticagrelor  90 mg Oral BID   Continuous Infusions:  sodium chloride     sodium chloride     sodium chloride 100 mL/hr at 11/03/21 0132   heparin 1,100 Units/hr (11/03/21 0601)   remdesivir 100 mg in sodium chloride 0.9 % 100 mL IVPB       LOS: 1 day   Cherene Altes, MD [facility] Office  [phone] Pager - Text Page per Amion  If 7PM-7AM, please contact night-coverage per  Amion 11/03/2021, 9:22 AM

## 2021-11-03 NOTE — Assessment & Plan Note (Signed)
Continue to monitor.  Patient denies any chest pain known history of CAD recent stent continue aspirin and Brilinta. Notify cardiology of patient's been admitted Troponin much lower than when patient had a STEMI which is reassuring with good trend EKG showing A-fib with slow rate no evidence of STEMI or acute ischemia at this time

## 2021-11-03 NOTE — Evaluation (Signed)
Physical Therapy Evaluation Patient Details Name: Gabriel [name] MRN: [id number] DOB: [birth year] Today's Date: 11/03/2021  History of Present Illness  88 yo male admitted with COVID, weakness, fall at his facility. Hx of neuropathy, OA, CAD, BPH, STEMI s/p revascularization 05/2021, Afib  Clinical Impression  On eval, pt was Min guard assist for mobility. He walked ~10 feet around the room. O2 96% on RA during session. Pt tolerated activity well. Daughter was present in room. Discussed d/c plan-pt will return to ALF. He is agreeable to HHPT f/u. Will plan to follow pt during this hospital stay.        Recommendations for follow up therapy are one component of a multi-disciplinary discharge planning process, led by the attending physician.  Recommendations may be updated based on patient status, additional functional criteria and insurance authorization.  Follow Up Recommendations Home health PT      Assistance Recommended at Discharge PRN  Patient can return home with the following  A little help with walking and/or transfers;A little help with bathing/dressing/bathroom;Assist for transportation;Assistance with cooking/housework;Help with stairs or ramp for entrance    Equipment Recommendations None recommended by PT  Recommendations for Other Services       Functional Status Assessment Patient has had a recent decline in their functional status and demonstrates the ability to make significant improvements in function in a reasonable and predictable amount of time.     Precautions / Restrictions Precautions Precautions: Fall Precaution Comments: COVID Restrictions Weight Bearing Restrictions: No      Mobility  Bed Mobility Overal bed mobility: Needs Assistance Bed Mobility: Supine to Sit, Sit to Supine     Supine to sit: Min guard, HOB elevated Sit to supine: Min guard, HOB elevated   General bed mobility comments: Increased time. Cues provided. (Pt has adjustable bed at  home). Assisted back to bed 2* pt has some difficulty rising from low surfaces.     Transfers Overall transfer level: Needs assistance Equipment used: Rolling walker (2 wheels) Transfers: Sit to/from Stand Sit to Stand: Min guard, From elevated surface           General transfer comment: Min guard for safety. Increased time. Cues for safety. Bed elevated (pt has adjustable bed at home)    Ambulation/Gait Ambulation/Gait assistance: Min guard Gait Distance (Feet): 10 Feet Assistive device: Rolling walker (2 wheels) Gait Pattern/deviations: Step-through pattern, Decreased stride length       General Gait Details: Min guard for safety. No LOB with RW use. O2 96% on RA  Stairs            Wheelchair Mobility    Modified Rankin (Stroke Patients Only)       Balance Overall balance assessment: Needs assistance, History of Falls         Standing balance support: Bilateral upper extremity supported, Reliant on assistive device for balance Standing balance-Leahy Scale: Poor                               Pertinent Vitals/Pain Pain Assessment Pain Assessment: No/denies pain    Home Living Family/patient expects to be discharged to:: Assisted living                 Home Equipment: Rollator (4 wheels)      Prior Function Prior Level of Function : Needs assist             Mobility Comments: uses walker for  short distance in room; otherwise uses scooter (stopped therapy about a month prior to admission) ADLs Comments: assist with bathing     Hand Dominance        Extremity/Trunk Assessment   Upper Extremity Assessment Upper Extremity Assessment: Defer to OT evaluation    Lower Extremity Assessment Lower Extremity Assessment: Generalized weakness ("bad knees")    Cervical / Trunk Assessment Cervical / Trunk Assessment: Normal  Communication   Communication: HOH  Cognition Arousal/Alertness: Awake/alert Behavior During  Therapy: WFL for tasks assessed/performed Overall Cognitive Status: Within Functional Limits for tasks assessed                                 General Comments: very HOH!        General Comments      Exercises     Assessment/Plan    PT Assessment Patient needs continued PT services  PT Problem List Decreased strength;Decreased balance;Decreased activity tolerance;Decreased mobility;Decreased knowledge of use of DME       PT Treatment Interventions DME instruction;Gait training;Functional mobility training;Therapeutic activities;Balance training;Patient/family education;Therapeutic exercise    PT Goals (Current goals can be found in the Care Plan section)  Acute Rehab PT Goals Patient Stated Goal: to get better PT Goal Formulation: With patient Time For Goal Achievement: 11/17/21 Potential to Achieve Goals: Good    Frequency Min 3X/week     Co-evaluation               AM-PAC PT "6 Clicks" Mobility  Outcome Measure Help needed turning from your back to your side while in a flat bed without using bedrails?: A Little Help needed moving from lying on your back to sitting on the side of a flat bed without using bedrails?: A Little Help needed moving to and from a bed to a chair (including a wheelchair)?: A Little Help needed standing up from a chair using your arms (e.g., wheelchair or bedside chair)?: A Little Help needed to walk in hospital room?: A Little Help needed climbing 3-5 steps with a railing? : A Little 6 Click Score: 18    End of Session Equipment Utilized During Treatment: Gait belt Activity Tolerance: Patient tolerated treatment well Patient left: in bed;with call bell/phone within reach;with family/visitor present   PT Visit Diagnosis: Muscle weakness (generalized) (M62.81);Difficulty in walking, not elsewhere classified (R26.2)    Time: 1030-1101 PT Time Calculation (min) (ACUTE ONLY): 31 min   Charges:   PT Evaluation $PT  Eval Moderate Complexity: 1 Mod PT Treatments $Gait Training: 8-22 mins           Doreatha Massed, PT Acute Rehabilitation  Office: [phone] Pager: [phone]

## 2021-11-03 NOTE — Progress Notes (Signed)
Bilateral lower extremity venous duplex completed. Refer to "CV Proc" under chart review to view preliminary results.  11/03/2021 2:29 PM Kelby Aline., MHA, RVT, RDCS, RDMS

## 2021-11-03 NOTE — Evaluation (Signed)
Occupational Therapy Evaluation Patient Details Name: Gabriel [name] MRN: [id number] DOB: 09-Nov-1924 Today's Date: 11/03/2021   History of Present Illness Gabriel [name] is a 88 y.o. male admitted 11/02/21 with generalized weakness, fall and COVID, BLE edema L>R. PMH includes CAD, hypertension BPH dysphagia and GERD HLD neuropathy paroxysmal atrial fibrillation, ischemic cardiomyopathy, STEMI status post revascularization in February 2023   Clinical Impression   Pt is resident at [facility] in ALF, typically gets around with Rollator/Scooter and gets assist for bathing from staff. Today he presents with generalized weakness, decreased activity tolerance. Recently worked with PT sp fatigued from that session. He was able to demonstrate bed mobility at min guard with increased time, once sitting EOB he engaged in grooming tasks with set up. Sitting balance was supervision level EOB. He was able to take steps up the bed with RW at min guard assist level. On RA throughout, At this time recommend continued OT in the acute setting as well as post-acute at the [facility] level to maximize safety and independence in ADL and functional transfers and return to PLOF/independence.       Recommendations for follow up therapy are one component of a multi-disciplinary discharge planning process, led by the attending physician.  Recommendations may be updated based on patient status, additional functional criteria and insurance authorization.   Follow Up Recommendations  Home health OT    Assistance Recommended at Discharge Intermittent Supervision/Assistance  Patient can return home with the following A little help with walking and/or transfers;A lot of help with bathing/dressing/bathroom;Assistance with cooking/housework;Assist for transportation;Help with stairs or ramp for entrance    Functional Status Assessment  Patient has had a recent decline in their functional status and demonstrates the ability to make  significant improvements in function in a reasonable and predictable amount of time.  Equipment Recommendations  None recommended by OT ([facility])    Recommendations for Other Services PT consult     Precautions / Restrictions Precautions Precautions: Fall Precaution Comments: COVID Restrictions Weight Bearing Restrictions: No      Mobility Bed Mobility Overal bed mobility: Needs Assistance Bed Mobility: Supine to Sit, Sit to Supine     Supine to sit: Min guard, HOB elevated Sit to supine: Min guard, HOB elevated   General bed mobility comments: Increased time. Cues provided. (Pt has adjustable bed at home)    Transfers Overall transfer level: Needs assistance Equipment used: Rolling walker (2 wheels) Transfers: Sit to/from Stand Sit to Stand: Min guard, From elevated surface           General transfer comment: Min guard for safety. Increased time. Cues for safety. Bed elevated (pt has adjustable bed at home)      Balance Overall balance assessment: Needs assistance, History of Falls Sitting-balance support: Feet supported Sitting balance-Leahy Scale: Fair Sitting balance - Comments: supervision sitting EOB   Standing balance support: Bilateral upper extremity supported, Reliant on assistive device for balance Standing balance-Leahy Scale: Poor                             ADL either performed or assessed with clinical judgement   ADL Overall ADL's : Needs assistance/impaired Eating/Feeding: Set up   Grooming: Wash/dry hands;Wash/dry face;Set up;Sitting Grooming Details (indicate cue type and reason): EOB Upper Body Bathing: Moderate assistance   Lower Body Bathing: Moderate assistance Lower Body Bathing Details (indicate cue type and reason): gets assist at baseline  for bathing from ALF staff Upper Body Dressing : Minimal assistance   Lower Body Dressing: Moderate assistance;Sit to/from stand   Toilet Transfer: Minimal  assistance;Stand-pivot;BSC/3in1;Rolling walker (2 wheels) Toilet Transfer Details (indicate cue type and reason): requires bed be elevated Toileting- Clothing Manipulation and Hygiene: Minimal assistance;Sit to/from stand       Functional mobility during ADLs: Min guard;Minimal assistance;Rolling walker (2 wheels) (steps up the bed only) General ADL Comments: generalized weakness, decreased activity tolerance for ADL     Vision Baseline Vision/History: 1 Wears glasses Patient Visual Report: No change from baseline       Perception     Praxis      Pertinent Vitals/Pain Pain Assessment Pain Assessment: No/denies pain     Hand Dominance     Extremity/Trunk Assessment Upper Extremity Assessment Upper Extremity Assessment: Generalized weakness   Lower Extremity Assessment Lower Extremity Assessment: Defer to PT evaluation   Cervical / Trunk Assessment Cervical / Trunk Assessment: Normal   Communication Communication Communication: HOH   Cognition Arousal/Alertness: Awake/alert Behavior During Therapy: WFL for tasks assessed/performed Overall Cognitive Status: Within Functional Limits for tasks assessed                                 General Comments: very HOH!     General Comments  Daughter present throughout    Exercises     Shoulder Instructions      Home Living Family/patient expects to be discharged to:: Assisted living                             Home Equipment: Rollator (4 wheels)   Additional Comments: [facility]      Prior Functioning/Environment Prior Level of Function : Needs assist       Physical Assist : ADLs (physical)   ADLs (physical): Bathing;IADLs Mobility Comments: uses walker for short distance in room; otherwise uses scooter (stopped therapy about a month prior to admission) ADLs Comments: assist with bathing        OT Problem List: Decreased activity tolerance;Impaired balance (sitting and/or  standing);Decreased knowledge of use of DME or AE      OT Treatment/Interventions: Self-care/ADL training;Energy conservation;DME and/or AE instruction;Therapeutic activities;Patient/family education;Balance training    OT Goals(Current goals can be found in the care plan section) Acute Rehab OT Goals Patient Stated Goal: get stronger again OT Goal Formulation: With patient/family Time For Goal Achievement: 11/17/21 Potential to Achieve Goals: Good ADL Goals Pt Will Perform Grooming: with modified independence;sitting Pt Will Perform Upper Body Dressing: with set-up;sitting Pt Will Perform Lower Body Dressing: with set-up;sit to/from stand Pt Will Transfer to Toilet: with supervision;ambulating Pt Will Perform Toileting - Clothing Manipulation and hygiene: with supervision;sit to/from stand Additional ADL Goal #1: Pt will perform bed mobility as precursor to participation in ADL at supervision level  OT Frequency: Min 2X/week    Co-evaluation              AM-PAC OT "6 Clicks" Daily Activity     Outcome Measure Help from another person eating meals?: A Little Help from another person taking care of personal grooming?: A Little Help from another person toileting, which includes using toliet, bedpan, or urinal?: A Little Help from another person bathing (including washing, rinsing, drying)?: A Lot Help from another person to put on and taking off regular upper body clothing?: A Little Help from  another person to put on and taking off regular lower body clothing?: A Little 6 Click Score: 17   End of Session Equipment Utilized During Treatment: Gait belt;Rolling walker (2 wheels) Nurse Communication: Mobility status;Precautions  Activity Tolerance: Patient tolerated treatment well Patient left: in bed;with call bell/phone within reach;with bed alarm set;with family/visitor present  OT Visit Diagnosis: Unsteadiness on feet (R26.81);History of falling (Z91.81);Muscle weakness  (generalized) (M62.81)                Time: 0370-4888 OT Time Calculation (min): 22 min Charges:  OT General Charges $OT Visit: 1 Visit OT Evaluation $OT Eval Moderate Complexity: [place] OTR/L Acute Rehabilitation Services Office: [place] 11/03/2021, 1:24 PM

## 2021-11-03 NOTE — Telephone Encounter (Signed)
The order is ready to fax

## 2021-11-03 NOTE — Progress Notes (Signed)
CRITICAL VALUE STICKER  CRITICAL VALUE: Potassium 2.4  Calcium 5.6  RECEIVER (on-site recipient of call):    Juliann Pares, RN  [place] NOTIFIED:  11/03/21 @ 0035   MESSENGER (representative from lab):  A. Mohamed  MD NOTIFIED: Gershon Cull, NP  TIME OF NOTIFICATION: 0040  RESPONSE:  see new orders

## 2021-11-03 NOTE — Progress Notes (Signed)
ANTICOAGULATION CONSULT NOTE - Initial Consult  Pharmacy Consult for heparin Indication: pulmonary embolus  Allergies  Allergen Reactions   Contrast Media [Iodinated Contrast Media] Rash    Patient Measurements: Weight: 71 kg (156 lb 8.4 oz) Heparin Dosing Weight: 71kg  Vital Signs: Temp: 97.3 F (36.3 C) (07/11 0335) BP: 151/63 (07/11 0335) Pulse Rate: 50 (07/11 0335)  Labs: Recent Labs    11/02/21 1640 11/02/21 1855 11/02/21 2130 11/02/21 2303  HGB 12.3*  --   --   --   HCT 35.9*  --   --   --   PLT 177  --   --   --   CREATININE 0.72  --  0.36*  --   CKTOTAL  --  495*  --   --   TROPONINIHS 29* 18* 14 22*    Estimated Creatinine Clearance: 51.6 mL/min (A) (by C-G formula based on SCr of 0.36 mg/dL (L)).   Medical History: Past Medical History:  Diagnosis Date   Arthritis    Diverticulosis of colon    External thrombosed hemorrhoids 06/11/2009   Qualifier: Diagnosis of  By: Sarajane Jews MD, Ishmael Holter    GERD (gastroesophageal reflux disease)    Hypertension      Assessment:  88 y.o. male with medical history significant of CAD, hypertension BPH dysphagia and GERD HLD neuropathy paroxysmal atrial fibrillation, ischemic cardiomyopathy.  Pharmacy consulted to dose heparin drip for PE.  No AC noted  Hgb 12.3, Plts177, DDimer 2.21, Scr 0.36  Goal of Therapy:  Heparin level 0.3-0.7 units/ml Monitor platelets by anticoagulation protocol: Yes   Plan:  Heparin bolus 2000 units x 1 Start heparin drip at 1100 units/hr Heparin level in 8 hours Daily CBC   Dolly Rias RPh 11/03/2021, 4:44 AM

## 2021-11-03 NOTE — TOC Initial Note (Addendum)
Transition of Care [facility]) - Initial/Assessment Note    Patient Details  Name: Gabriel [name] MRN: [id number] Date of Birth: 04-09-1925  Transition of [facility]) CM/SW Contact:    Dessa Phi, RN Phone Number: 11/03/2021, 2:09 PM  Clinical Narrative:[facility] [place]-[facility]-rep Katie following for return-she will check on quarantine number of days for covid.  Facility has own HHPT/OT.Family to transport back on own if needed.                 Expected Discharge Plan: Assisted Living Barriers to Discharge: Continued Medical Work up   Patient Goals and CMS Choice        Expected Discharge Plan and Services Expected Discharge Plan: Assisted Living                                              Prior Living Arrangements/Services                       Activities of Daily Living Home Assistive Devices/Equipment: Electric scooter, Eyeglasses, Hearing aid ADL Screening (condition at time of admission) Patient's cognitive ability adequate to safely complete daily activities?: Yes Is the patient deaf or have difficulty hearing?: Yes Does the patient have difficulty seeing, even when wearing glasses/contacts?: No Does the patient have difficulty concentrating, remembering, or making decisions?: No Patient able to express need for assistance with ADLs?: Yes Does the patient have difficulty dressing or bathing?: Yes Independently performs ADLs?: No Communication: Independent Dressing (OT): Independent Grooming: Independent Feeding: Independent Bathing: Needs assistance Is this a change from baseline?: Pre-admission baseline Toileting: Independent In/Out Bed: Independent Walks in Home: Independent Does the patient have difficulty walking or climbing stairs?: Yes Weakness of Legs: None Weakness of Arms/Hands: Both  Permission Sought/Granted                  Emotional Assessment              Admission diagnosis:  Weakness  [R53.1] Acute respiratory failure with hypoxia ([place]) [J96.01] Acute respiratory failure due to COVID-19 ([place]) [U07.1, J96.00] COVID-19 [U07.1] Patient Active Problem List   Diagnosis Date Noted   Acute respiratory failure due to COVID-19 ([place]) 11/02/2021   CAD S/P percutaneous coronary angioplasty 11/02/2021   COVID-19 virus infection 11/02/2021   Hyponatremia 11/02/2021   Elevated CK 11/02/2021   Edema of left lower leg 11/02/2021   Elevated troponin 11/02/2021   Hyperlipidemia 06/10/2021   Paroxysmal atrial fibrillation ([place]) 06/10/2021   STEMI involving left anterior descending coronary artery ([place]) 06/07/2021   STEMI (ST elevation myocardial infarction) ([place]) 06/06/2021   Osteoarthritis 03/21/2018   Neuropathy, peripheral 05/31/2017   BPH with urinary obstruction 09/23/2015   Dysphagia 05/07/2011   Allergic rhinitis 07/09/2009   EXTERNAL THROMBOSED HEMORRHOIDS 06/11/2009   Bradycardia 08/23/2007   DIVERTICULOSIS, COLON 08/23/2007   Essential hypertension 09/14/2006   GERD 09/14/2006   PCP:  Laurey Morale, MD Pharmacy:   [place], [place] - [place] [address] [place] [address] Phone: [phone] Fax: [phone]     Social Determinants of Health (SDOH) Interventions    Readmission Risk Interventions     No data to display

## 2021-11-03 NOTE — Telephone Encounter (Signed)
Called and spoke with an agent at [facility], state that Gabriel [name] was off today and that she was covering for her, advised that Dr Sarajane Jews approved for pt to be transported to the Hospital, state that pt was already sent to the hospital, fax confirmation was faxed to [facility]

## 2021-11-04 ENCOUNTER — Telehealth: Payer: Self-pay

## 2021-11-04 DIAGNOSIS — U071 COVID-19: Secondary | ICD-10-CM | POA: Diagnosis not present

## 2021-11-04 DIAGNOSIS — J9600 Acute respiratory failure, unspecified whether with hypoxia or hypercapnia: Secondary | ICD-10-CM | POA: Diagnosis not present

## 2021-11-04 LAB — COMPREHENSIVE METABOLIC PANEL
ALT: 37 U/L (ref 0–44)
AST: 63 U/L — ABNORMAL HIGH (ref 15–41)
Albumin: 2.7 g/dL — ABNORMAL LOW (ref 3.5–5.0)
Alkaline Phosphatase: 74 U/L (ref 38–126)
Anion gap: 7 (ref 5–15)
BUN: 16 mg/dL (ref 8–23)
CO2: 19 mmol/L — ABNORMAL LOW (ref 22–32)
Calcium: 8 mg/dL — ABNORMAL LOW (ref 8.9–10.3)
Chloride: 108 mmol/L (ref 98–111)
Creatinine, Ser: 0.51 mg/dL — ABNORMAL LOW (ref 0.61–1.24)
GFR, Estimated: >60 mL/min (ref >60)
Glucose, Bld: 154 mg/dL — ABNORMAL HIGH (ref 70–99)
Potassium: 3.8 mmol/L (ref 3.5–5.1)
Sodium: 134 mmol/L — ABNORMAL LOW (ref 135–145)
Total Bilirubin: 0.8 mg/dL (ref 0.3–1.2)
Total Protein: 5.2 g/dL — ABNORMAL LOW (ref 6.5–8.1)

## 2021-11-04 LAB — CBC
HCT: 33.3 % — ABNORMAL LOW (ref 39.0–52.0)
Hemoglobin: 11.4 g/dL — ABNORMAL LOW (ref 13.0–17.0)
MCH: 31.8 pg (ref 26.0–34.0)
MCHC: 34.2 g/dL (ref 30.0–36.0)
MCV: 93 fL (ref 80.0–100.0)
Platelets: 157 10*3/uL (ref 150–400)
RBC: 3.58 MIL/uL — ABNORMAL LOW (ref 4.22–5.81)
RDW: 13.7 % (ref 11.5–15.5)
WBC: 9.7 10*3/uL (ref 4.0–10.5)
nRBC: 0 % (ref 0.0–0.2)

## 2021-11-04 MED ORDER — ENOXAPARIN SODIUM 80 MG/0.8ML IJ SOSY
1.0000 mg/kg | PREFILLED_SYRINGE | Freq: Two times a day (BID) | INTRAMUSCULAR | Status: DC
  Administered 2021-11-04 – 2021-11-05 (×3): 70 mg via SUBCUTANEOUS
  Filled 2021-11-04 (×3): qty 0.8

## 2021-11-04 MED ORDER — ENSURE ENLIVE PO LIQD
237.0000 mL | Freq: Two times a day (BID) | ORAL | Status: DC
  Administered 2021-11-04 – 2021-11-05 (×2): 237 mL via ORAL

## 2021-11-04 NOTE — Progress Notes (Addendum)
[facility] PROGRESS NOTE    Progress Note  Gabriel [name]  JEH:[id number] DOB: Aug 09, 1924 DOA: 11/02/2021 PCP: Laurey Morale, MD     Brief Narrative:   Gabriel [name] is an 88 y.o. male past medical history of CAD, essential hypertension BPH chronic dysphagia, paroxysmal atrial fibrillation not on anticoagulation, neuropathy presents to the ED with generalized weakness and mechanical fall that happened 24 hours prior to admission was also experiencing low-grade fever was found to be COVID-positive at facility.    Assessment/Plan:   COVID-pneumonia: Agree with holding remdesivir and Paxlovid. We will try to wean off steroids. Wean to room air, out of bed to chair.  Right segmental acute pulmonary embolism: Started on IV heparin patient dislodges on IV and had difficult to control bleeding. Heparin was stopped for 12 hours. Started on Lovenox. Discussed with cardiology recommended to drop the aspirin, continue Lovenox and Brilinta. If he has no further bleeding on this can transition to Eliquis low-dose and continue Brilinta.  Hyponatremia: Likely due to hypovolemia slowly with fluid resuscitation. KVO IV fluids. We will allow oral hydration.  Bradycardia: Continue to hold beta-blocker. Check a twelve-lead EKG to rule out A-fib.  Edema of the left lower extremity: Venous dark plaques without evidence of DVT.  CAD status post DES stent to the LAD: Left heart cath in June 10, 2021 currently on aspirin Brilinta statin. Discussed with cardiology discontinue aspirin as he will be on a DOAC  Chronic paroxysmal atrial fibrillation: Rate control will have to be on Brilinta and a DOAC discussed with cardiology will need to follow-up with Dr. Claiborne Billings as an outpatient.  Chronic combined systolic and diastolic heart failure: With an EF of 40% back in February 2023. Continue current home regimen.  Hypokalemia: Repleted orally, now resolved.  Essential  hypertension: Metoprolol held due to bradycardia.  Hyperlipidemia: Continue statins.  Elevated troponins: Likely due to PE not trending down.    DVT prophylaxis: lovenox Family Communication:daughter Status is: Inpatient Remains inpatient appropriate because: Acute PE    Code Status:     Code Status Orders  (From admission, onward)           Start     Ordered   11/02/21 2342  Full code  Continuous        11/02/21 2341           Code Status History     Date Active Date Inactive Code Status Order ID Comments User Context   11/02/2021 2336 11/02/2021 2341 DNR [id number]  Toy Baker, MD Inpatient   11/02/2021 2240 11/02/2021 2336 Full Code [id number]  Toy Baker, MD ED   11/02/2021 1952 11/02/2021 2240 Full Code [id number]  Toy Baker, MD ED   06/07/2021 0024 06/10/2021 1820 Full Code [id number]  End, Harrell Gave, MD Inpatient      Advance Directive Documentation    Flowsheet Row Most Recent Value  Type of Advance Directive Living will  Pre-existing out of facility DNR order (yellow form or pink MOST form) --  "MOST" Form in Place? --         IV Access:   Peripheral IV   Procedures and diagnostic studies:   VAS [place] LOWER EXTREMITY VENOUS (DVT)  Result Date: 11/03/2021  Lower Venous DVT Study Patient Name:  Gabriel [name]  Date of Exam:   11/03/2021 Medical Rec #: [id number]        Accession #:    [id number] Date of Birth: 07/26/1924  Patient Gender: M Patient Age:   11 years Exam Location:  [facility] Procedure:      VAS [place] LOWER EXTREMITY VENOUS (DVT) Referring Phys: Nyoka Lint DOUTOVA --------------------------------------------------------------------------------  Indications: Edema, and Covid positive.  Limitations: Poor ultrasound/tissue interface. Comparison Study: No prior study Performing Technologist: Maudry Mayhew MHA, RDMS, RVT, RDCS  Examination Guidelines: A complete evaluation includes B-mode imaging,  spectral Doppler, color Doppler, and power Doppler as needed of all accessible portions of each vessel. Bilateral testing is considered an integral part of a complete examination. Limited examinations for reoccurring indications may be performed as noted. The reflux portion of the exam is performed with the patient in reverse Trendelenburg.  +---------+---------------+---------+-----------+----------+--------------+ RIGHT    CompressibilityPhasicitySpontaneityPropertiesThrombus Aging +---------+---------------+---------+-----------+----------+--------------+ CFV      Full           Yes      Yes                                 +---------+---------------+---------+-----------+----------+--------------+ SFJ      Full                                                        +---------+---------------+---------+-----------+----------+--------------+ FV Prox  Full                                                        +---------+---------------+---------+-----------+----------+--------------+ FV Mid   Full                                                        +---------+---------------+---------+-----------+----------+--------------+ FV DistalFull                                                        +---------+---------------+---------+-----------+----------+--------------+ PFV      Full                                                        +---------+---------------+---------+-----------+----------+--------------+ POP      Full           Yes      Yes                                 +---------+---------------+---------+-----------+----------+--------------+   Right Technical Findings: Not visualized segments include PTV, peroneal veins.  +---------+---------------+---------+-----------+----------+--------------+ LEFT     CompressibilityPhasicitySpontaneityPropertiesThrombus Aging +---------+---------------+---------+-----------+----------+--------------+  CFV      Full           Yes      Yes                                 +---------+---------------+---------+-----------+----------+--------------+  SFJ      Full                                                        +---------+---------------+---------+-----------+----------+--------------+ FV Prox  Full                                                        +---------+---------------+---------+-----------+----------+--------------+ FV Mid   Full                                                        +---------+---------------+---------+-----------+----------+--------------+ FV DistalFull                                                        +---------+---------------+---------+-----------+----------+--------------+ PFV      Full                                                        +---------+---------------+---------+-----------+----------+--------------+ POP      Full           Yes      Yes                                 +---------+---------------+---------+-----------+----------+--------------+ PTV      Full                                                        +---------+---------------+---------+-----------+----------+--------------+ PERO     Full                                                        +---------+---------------+---------+-----------+----------+--------------+     Summary: RIGHT: - There is no evidence of deep vein thrombosis in the lower extremity. However, portions of this examination were limited- see technologist comments above.  - No cystic structure found in the popliteal fossa.  LEFT: - There is no evidence of deep vein thrombosis in the lower extremity.  - No cystic structure found in the popliteal fossa.  *See table(s) above for measurements and observations. Electronically signed by Servando Snare MD on 11/03/2021 at 2:59:45 PM.    Final    ECHOCARDIOGRAM COMPLETE  Result Date: 11/03/2021    ECHOCARDIOGRAM REPORT    Patient Name:   Gabriel [name] Date  of Exam: 11/03/2021 Medical Rec #:  [id number]       Height:       67.0 in Accession #:    [id number]      Weight:       156.5 lb Date of Birth:  March 20, 1925       BSA:          1.822 m Patient Age:    89 years        BP:           151/63 mmHg Patient Gender: M               HR:           59 bpm. Exam Location:  Inpatient Procedure: 2D Echo, Cardiac Doppler and Color Doppler Indications:    Elevated troponin  History:        Patient has prior history of Echocardiogram examinations. CAD,                 Arrythmias:Atrial Fibrillation; Risk Factors:Hypertension.  Sonographer:    Jyl Heinz Referring Phys: [place]  1. Left ventricular ejection fraction, by estimation, is 65 to 70%. The left ventricle has normal function. The left ventricle has no regional wall motion abnormalities. Left ventricular diastolic parameters are consistent with Grade II diastolic dysfunction (pseudonormalization). Elevated left atrial pressure.  2. Right ventricular systolic function is normal. The right ventricular size is normal. There is mildly elevated pulmonary artery systolic pressure. The estimated right ventricular systolic pressure is 69.6 mmHg.  3. Left atrial size was mildly dilated.  4. The mitral valve is normal in structure. Mild mitral valve regurgitation.  5. The aortic valve is tricuspid. There is mild calcification of the aortic valve. There is mild thickening of the aortic valve. Aortic valve regurgitation is trivial. Aortic valve sclerosis is present, with no evidence of aortic valve stenosis.  6. The inferior vena cava is dilated in size with >50% respiratory variability, suggesting right atrial pressure of 8 mmHg. Comparison(s): Prior images reviewed side by side. The left ventricular function has improved. The left ventricular wall motion abnormality is improved. FINDINGS  Left Ventricle: Left ventricular ejection fraction, by estimation, is 65 to 70%. The  left ventricle has normal function. The left ventricle has no regional wall motion abnormalities. The left ventricular internal cavity size was normal in size. There is  borderline concentric left ventricular hypertrophy. Left ventricular diastolic parameters are consistent with Grade II diastolic dysfunction (pseudonormalization). Elevated left atrial pressure. Right Ventricle: The right ventricular size is normal. No increase in right ventricular wall thickness. Right ventricular systolic function is normal. There is mildly elevated pulmonary artery systolic pressure. The tricuspid regurgitant velocity is 2.81  m/s, and with an assumed right atrial pressure of 8 mmHg, the estimated right ventricular systolic pressure is 29.5 mmHg. Left Atrium: Left atrial size was mildly dilated. Right Atrium: Right atrial size was normal in size. Prominent Eustachian valve. Pericardium: There is no evidence of pericardial effusion. Mitral Valve: The mitral valve is normal in structure. Mild mitral valve regurgitation, with centrally-directed jet. Tricuspid Valve: The tricuspid valve is normal in structure. Tricuspid valve regurgitation is mild. Aortic Valve: The aortic valve is tricuspid. There is mild calcification of the aortic valve. There is mild thickening of the aortic valve. Aortic valve regurgitation is trivial. Aortic regurgitation PHT measures 721 msec. Aortic valve sclerosis is present, with no evidence of aortic valve stenosis. Aortic valve peak gradient measures 4.8 mmHg. Pulmonic  Valve: The pulmonic valve was grossly normal. Pulmonic valve regurgitation is not visualized. Aorta: The aortic root and ascending aorta are structurally normal, with no evidence of dilitation. Venous: The inferior vena cava is dilated in size with greater than 50% respiratory variability, suggesting right atrial pressure of 8 mmHg. IAS/Shunts: No atrial level shunt detected by color flow Doppler.  LEFT VENTRICLE PLAX 2D LVIDd:         3.90  cm     Diastology LVIDs:         2.40 cm     LV e' medial:    4.79 cm/s LV PW:         1.10 cm     LV E/e' medial:  17.7 LV IVS:        1.20 cm     LV e' lateral:   6.53 cm/s LVOT diam:     2.00 cm     LV E/e' lateral: 13.0 LV SV:         56 LV SV Index:   31 LVOT Area:     3.14 cm  LV Volumes (MOD) LV vol d, MOD A2C: 98.5 ml LV vol d, MOD A4C: 99.1 ml LV vol s, MOD A2C: 36.3 ml LV vol s, MOD A4C: 36.8 ml LV SV MOD A2C:     62.2 ml LV SV MOD A4C:     99.1 ml LV SV MOD BP:      62.0 ml RIGHT VENTRICLE             IVC RV Basal diam:  3.40 cm     IVC diam: 2.20 cm RV Mid diam:    2.40 cm RV S prime:     10.90 cm/s TAPSE (M-mode): 1.9 cm LEFT ATRIUM             Index        RIGHT ATRIUM           Index LA diam:        4.00 cm 2.20 cm/m   RA Area:     13.80 cm LA Vol (A2C):   74.7 ml 40.99 ml/m  RA Volume:   33.70 ml  18.49 ml/m LA Vol (A4C):   70.9 ml 38.91 ml/m LA Biplane Vol: 73.6 ml 40.39 ml/m  AORTIC VALVE AV Area (Vmax): 2.24 cm AV Vmax:        109.00 cm/s AV Peak Grad:   4.8 mmHg LVOT Vmax:      77.70 cm/s LVOT Vmean:     51.900 cm/s LVOT VTI:       0.178 m AI PHT:         721 msec  AORTA Ao Root diam: 3.30 cm Ao Asc diam:  3.10 cm MITRAL VALVE               TRICUSPID VALVE MV Area (PHT): 2.72 cm    TR Peak grad:   31.6 mmHg MV Decel Time: 279 msec    TR Vmax:        281.00 cm/s MV E velocity: 84.84 cm/s MV A velocity: 89.10 cm/s  SHUNTS MV E/A ratio:  0.95        Systemic VTI:  0.18 m                            Systemic Diam: 2.00 cm Dani Gobble Croitoru MD Electronically signed by Sanda Klein MD Signature Date/Time: 11/03/2021/9:02:33 AM  Final    CT Angio Chest PE W and/or Wo Contrast  Addendum Date: 11/03/2021   ADDENDUM REPORT: 11/03/2021 04:08 ADDENDUM: These results were called by telephone at the time of interpretation on 11/03/2021 at 4:02 am to provider Yvetta Coder, the floor nurse caring for the patient, who verbally acknowledged these results. Electronically Signed   By: Julian Hy M.D.    On: 11/03/2021 04:08   Result Date: 11/03/2021 CLINICAL DATA:  Fever/chills, COVID, positive D-dimer. EXAM: CT ANGIOGRAPHY CHEST WITH CONTRAST TECHNIQUE: Multidetector CT imaging of the chest was performed using the standard protocol during bolus administration of intravenous contrast. Multiplanar CT image reconstructions and MIPs were obtained to evaluate the vascular anatomy. RADIATION DOSE REDUCTION: This exam was performed according to the departmental dose-optimization program which includes automated exposure control, adjustment of the mA and/or kV according to patient size and/or use of iterative reconstruction technique. CONTRAST:  33m OMNIPAQUE IOHEXOL 350 MG/ML SOLN COMPARISON:  Chest radiograph dated 11/02/2021. CT chest dated 09/04/2013. FINDINGS: Cardiovascular: Satisfactory opacification of the bilateral pulmonary arteries to the lobar level. Evaluation is constrained by motion degradation, particularly in the bilateral lower lobes. Within that constraint, the study is positive for lobar and segmental pulmonary emboli in branches of the right upper lobe (series 5/images 98, 101, and 109). Overall clot burden is small. The heart is normal in size. Elevated RV to LV ratio (1.3), raising the possibility of right heart strain. Retrograde opacification of the infrahepatic IVC. Although not tailored for evaluation of the thoracic aorta, there is no evidence thoracic aortic aneurysm. Atherosclerotic calcifications of the aortic arch. Mediastinum/Nodes: No suspicious mediastinal lymphadenopathy. Visualized thyroid is unremarkable. Lungs/Pleura: Evaluation of the lung parenchyma is constrained by respiratory motion. Within that constraint, there are no suspicious pulmonary nodules. Mild subpleural patchy opacities in the lungs bilaterally, lower lobe predominant, possibly related to the patient's known COVID pneumonia. No pleural effusion or pneumothorax. Upper Abdomen: Visualized upper abdomen is notable  for a small hiatal hernia. Vascular calcifications. Musculoskeletal: Degenerative changes of the lower thoracic spine. Review of the MIP images confirms the above findings. IMPRESSION: Lobar and segmental pulmonary emboli in branches of the right upper lobe. Overall clot burden is small. Elevated RV to LV ratio (1.3), raising the possibility of right heart strain. Positive for acute PE with CT evidence of right heart strain (RV/LV Ratio = 1.3) consistent with at least submassive (intermediate risk) PE. The presence of right heart strain has been associated with an increased risk of morbidity and mortality. Please refer to the "Code PE Focused" order set in EPIC. Mild subpleural patchy opacities in the lungs bilaterally, lower lobe predominant, possibly related to the patient's known COVID pneumonia. Aortic Atherosclerosis (ICD10-I70.0). Electronically Signed: By: SJulian HyM.D. On: 11/03/2021 03:37   DG Chest Portable 1 View  Result Date: 11/02/2021 CLINICAL DATA:  COVID.  Weakness, fever and chills. EXAM: PORTABLE CHEST 1 VIEW COMPARISON:  Chest radiograph 06/07/2021 FINDINGS: Patchy and heterogeneous bilateral lower lung zone opacities, left greater than right. Lower lung volumes from prior exam. Upper normal heart size. Aortic atherosclerosis. No pulmonary edema, large pleural effusion, or pneumothorax. No acute osseous findings. IMPRESSION: Patchy and heterogeneous bilateral lower lung zone opacities, left greater than right, suspicious for pneumonia in the setting of COVID. Electronically Signed   By: MKeith RakeM.D.   On: 11/02/2021 17:09     Medical Consultants:   None.   Subjective:    Gabriel [name]   Objective:    Vitals:  11/03/21 1400 11/03/21 1500 11/03/21 2151 11/04/21 0414  BP: (!) 144/64 (!) 145/68 132/65 (!) 132/59  Pulse: 63 64 (!) 57 (!) 50  Resp: '20 20 20 18  '$ Temp: 98.6 F (37 C) 98.2 F (36.8 C) 98.4 F (36.9 C) (!) 97.5 F (36.4 C)  TempSrc: Oral  Oral    SpO2: 96% 94% 97% 97%  Weight:      Height:       SpO2: 97 % O2 Flow Rate (L/min): 2 L/min   Intake/Output Summary (Last 24 hours) at 11/04/2021 0833 Last data filed at 11/04/2021 3009 Gross per 24 hour  Intake 1540.14 ml  Output 2000 ml  Net -459.86 ml   Filed Weights   11/02/21 2200 11/03/21 0844  Weight: 71 kg 71 kg    Exam: General exam: In no acute distress. Respiratory system: Good air movement and clear to auscultation. Cardiovascular system: S1 & S2 heard, RRR. No JVD, murmurs, rubs, gallops or clicks.  Gastrointestinal system: Abdomen is nondistended, soft and nontender.  Central nervous system: Alert and oriented. No focal neurological deficits. Extremities: No pedal edema. Skin: No rashes, lesions or ulcers Psychiatry: Judgement and insight appear normal. Mood & affect appropriate.    Data Reviewed:    Labs: Basic Metabolic Panel: Recent Labs  Lab 11/02/21 1640 11/02/21 1648 11/02/21 2130 11/03/21 0537 11/04/21 0548  NA 132*  --  139 133*  132* 134*  K 3.7  --  2.4* 4.1  4.0 3.8  CL 105  --  120* 104  104 108  CO2 18*  --  12* 21*  21* 19*  GLUCOSE 116*  --  97 155*  162* 154*  BUN 18  --  '11 16  16 16  '$ CREATININE 0.72  --  0.36* 0.51*  0.52* 0.51*  CALCIUM 8.5*  --  5.6* 8.3*  8.3* 8.0*  MG 1.9  --  1.3* 1.9  --   PHOS  --  3.3  --  4.4  --    GFR Estimated Creatinine Clearance: 51.6 mL/min (A) (by C-G formula based on SCr of 0.51 mg/dL (L)). Liver Function Tests: Recent Labs  Lab 11/02/21 2130 11/03/21 0537 11/04/21 0548  AST 45* 61* 63*  ALT 24 39 37  ALKPHOS 52 83 74  BILITOT 1.3* 1.5* 0.8  PROT 3.7* 5.9* 5.2*  ALBUMIN 1.8* 3.1* 2.7*   No results for input(s): "LIPASE", "AMYLASE" in the last 168 hours. No results for input(s): "AMMONIA" in the last 168 hours. Coagulation profile Recent Labs  Lab 11/03/21 0537  INR 1.2   COVID-19 Labs  Recent Labs    11/02/21 1648 11/03/21 0537  DDIMER 2.21* 1.40*   FERRITIN 228 256  LDH 216*  --   CRP 4.8* 6.1*    Lab Results  Component Value Date   SARSCOV2NAA POSITIVE (A) 11/02/2021   [place] NEGATIVE 06/06/2021    CBC: Recent Labs  Lab 11/02/21 1640 11/03/21 0537 11/03/21 1737 11/04/21 0548  WBC 7.0 3.8* 12.0* 9.7  NEUTROABS 4.7 2.9  --   --   HGB 12.3* 12.7* 12.1* 11.4*  HCT 35.9* 37.0* 35.6* 33.3*  MCV 93.5 92.7 92.0 93.0  PLT 177 163 181 157   Cardiac Enzymes: Recent Labs  Lab 11/02/21 1855 11/03/21 0537  CKTOTAL 495* 382   BNP (last 3 results) No results for input(s): "PROBNP" in the last 8760 hours. CBG: No results for input(s): "GLUCAP" in the last 168 hours. D-Dimer: Recent Labs    11/02/21  1648 11/03/21 0537  DDIMER 2.21* 1.40*   Hgb A1c: No results for input(s): "HGBA1C" in the last 72 hours. Lipid Profile: No results for input(s): "CHOL", "HDL", "LDLCALC", "TRIG", "CHOLHDL", "LDLDIRECT" in the last 72 hours. Thyroid function studies: Recent Labs    11/02/21 1648  TSH 2.039   Anemia work up: Recent Labs    11/02/21 1648 11/03/21 0537  FERRITIN 228 256   Sepsis Labs: Recent Labs  Lab 11/02/21 1640 11/02/21 1648 11/03/21 0537 11/03/21 1737 11/04/21 0548  PROCALCITON  --  <0.10  --   --   --   WBC 7.0  --  3.8* 12.0* 9.7   Microbiology Recent Results (from the past 240 hour(s))  Resp Panel by RT-PCR (Flu A&B, Covid) Anterior Nasal Swab     Status: Abnormal   Collection Time: 11/02/21  7:18 PM   Specimen: Anterior Nasal Swab  Result Value Ref Range Status   SARS Coronavirus 2 by RT PCR POSITIVE (A) NEGATIVE Final    Comment: (NOTE) SARS-CoV-2 target nucleic acids are DETECTED.  The SARS-CoV-2 RNA is generally detectable in upper respiratory specimens during the acute phase of infection. Positive results are indicative of the presence of the identified virus, but do not rule out bacterial infection or co-infection with other pathogens not detected by the test. Clinical correlation  with patient history and other diagnostic information is necessary to determine patient infection status. The expected result is Negative.  Fact Sheet for Patients: EntrepreneurPulse.com.au  Fact Sheet for Healthcare Providers: IncredibleEmployment.be  This test is not yet approved or cleared by the [place] FDA and  has been authorized for detection and/or diagnosis of SARS-CoV-2 by FDA under an Emergency Use Authorization (EUA).  This EUA will remain in effect (meaning this test can be used) for the duration of  the COVID-19 declaration under Section 564(b)(1) of the A ct, 21 U.S.C. section 360bbb-3(b)(1), unless the authorization is terminated or revoked sooner.     Influenza A by PCR NEGATIVE NEGATIVE Final   Influenza B by PCR NEGATIVE NEGATIVE Final    Comment: (NOTE) The Xpert Xpress SARS-CoV-2/FLU/RSV plus assay is intended as an aid in the diagnosis of influenza from Nasopharyngeal swab specimens and should not be used as a sole basis for treatment. Nasal washings and aspirates are unacceptable for Xpert Xpress SARS-CoV-2/FLU/RSV testing.  Fact Sheet for Patients: EntrepreneurPulse.com.au  Fact Sheet for Healthcare Providers: IncredibleEmployment.be  This test is not yet approved or cleared by the [place] FDA and has been authorized for detection and/or diagnosis of SARS-CoV-2 by FDA under an Emergency Use Authorization (EUA). This EUA will remain in effect (meaning this test can be used) for the duration of the COVID-19 declaration under Section 564(b)(1) of the Act, 21 U.S.C. section 360bbb-3(b)(1), unless the authorization is terminated or revoked.  Performed at [facility], [place] [address], [place], [place] [address]   MRSA Next Gen by PCR, Nasal     Status: None   Collection Time: 11/03/21  1:17 AM   Specimen: Nasal Mucosa; Nasal Swab  Result Value Ref  Range Status   MRSA by PCR Next Gen NOT DETECTED NOT DETECTED Final    Comment: (NOTE) The GeneXpert MRSA Assay (FDA approved for NASAL specimens only), is one component of a comprehensive MRSA colonization surveillance program. It is not intended to diagnose MRSA infection nor to guide or monitor treatment for MRSA infections. Test performance is not FDA approved in patients less than 38 years old. Performed  at [facility], [place] [address], [place], [place] [address]      Medications:    aspirin  81 mg Oral Daily   atorvastatin  40 mg Oral Daily   gabapentin  300 mg Oral TID   methylPREDNISolone (SOLU-MEDROL) injection  40 mg Intravenous Q12H   pantoprazole  40 mg Oral Daily   sodium chloride flush  3 mL Intravenous Q12H   ticagrelor  90 mg Oral BID   Continuous Infusions:  sodium chloride 50 mL/hr at 11/04/21 0811      LOS: 2 days   Charlynne Cousins  [facility]  11/04/2021, 8:33 AM

## 2021-11-04 NOTE — Progress Notes (Signed)
Initial Nutrition Assessment  DOCUMENTATION CODES:   Not applicable  INTERVENTION:  - will order Ensure Plus High Protein BID, each supplement provides 350 kcal and 20 grams of protein.  - complete NFPE when feasible.    NUTRITION DIAGNOSIS:   Increased nutrient needs related to acute illness as evidenced by estimated needs.  GOAL:   Patient will meet greater than or equal to 90% of their needs  MONITOR:   PO intake, Supplement acceptance, Labs, Weight trends  REASON FOR ASSESSMENT:   Consult Assessment of nutrition requirement/status  ASSESSMENT:   88 y.o. male with medical history of CAD, HTN, BPH, dysphagia, GERD, HLD, neuropathy, afib, and ischemic cardiomyopathy. Patient presented to the ED due to generalized weakness, fall, sore throat, mild BLE and in the ED was found to incidentally be positive for COVID-19. Patient was admitted for acute respiratory failure d/t COVID-19.  Patient was able to eat 75% of breakfast and 50% of lunch yesterday. No documented intakes of dinner yesterday or meals today.  He has not been seen by a [place] RD at any time in the past.  Weight yesterday was 156 lb and weight has been stable since 11/28/20. Mild pitting edema to BLE documented in the edema section of flow sheet.    Labs reviewed; Na: 134 mmol/l, creatinine: 0.51 mg/dl, Ca: 8 mg/dl.  Medications reviewed; 40 mg oral protonix/day.     NUTRITION - FOCUSED PHYSICAL EXAM:  Unable at this time.  Diet Order:   Diet Order             Diet regular Room service appropriate? Yes; Fluid consistency: Thin  Diet effective now                   EDUCATION NEEDS:   No education needs have been identified at this time  Skin:  Skin Assessment: Skin Integrity Issues: Skin Integrity Issues:: Stage II Stage II: mid-buttocks  Last BM:  7/12 (type 3 x1, small amount)  Height:   Ht Readings from Last 1 Encounters:  11/03/21 '5\' 7"'$  (1.702 m)    Weight:   Wt  Readings from Last 1 Encounters:  11/03/21 71 kg     BMI:  Body mass index is 24.52 kg/m.  Estimated Nutritional Needs:  Kcal:  1800-2000 kcal Protein:  80-95 grams Fluid:  >/= 2 L/day      Jarome Matin, MS, RD, LDN, CNSC Registered Dietitian II Inpatient Clinical Nutrition RD pager # and on-call/weekend pager # available in [facility]

## 2021-11-04 NOTE — Progress Notes (Signed)
ANTICOAGULATION CONSULT NOTE  Pharmacy Consult for Enoxaparin Indication: pulmonary embolus  Allergies  Allergen Reactions   Contrast Media [Iodinated Contrast Media] Rash    Patient Measurements: Height: '5\' 7"'$  (170.2 cm) Weight: 71 kg (156 lb 8.4 oz) IBW/kg (Calculated) : 66.1   Vital Signs: Temp: 97.5 F (36.4 C) (07/12 0414) BP: 132/59 (07/12 0414) Pulse Rate: 50 (07/12 0414)  Labs: Recent Labs    11/02/21 1855 11/02/21 2130 11/02/21 2303 11/03/21 0537 11/03/21 1421 11/03/21 1737 11/04/21 0548  HGB  --   --   --  12.7*  --  12.1* 11.4*  HCT  --   --   --  37.0*  --  35.6* 33.3*  PLT  --   --   --  163  --  181 157  APTT  --   --   --  36  --   --   --   LABPROT  --   --   --  15.4*  --   --   --   INR  --   --   --  1.2  --   --   --   HEPARINUNFRC  --   --   --   --  0.55  --   --   CREATININE  --  0.36*  --  0.51*  0.52*  --   --  0.51*  CKTOTAL 495*  --   --  382  --   --   --   TROPONINIHS 18* 14 22* 15  --   --   --      Estimated Creatinine Clearance: 51.6 mL/min (A) (by C-G formula based on SCr of 0.51 mg/dL (L)).   Medical History: Past Medical History:  Diagnosis Date   Arthritis    Diverticulosis of colon    External thrombosed hemorrhoids 06/11/2009   Qualifier: Diagnosis of  By: Sarajane Jews MD, Ishmael Holter    GERD (gastroesophageal reflux disease)    Hypertension      Assessment: 88 y/o male with PMH of CAD s/p DES to LAD, HTN, BPH, GERD, paroxysmal atrial fibrillation not on anticoagulation PTA who presented to [facility] ED on 11/02/21 with weakness and mechanical fall. Patient found to be COVID positive. D-dimer elevated. CTa chest + for PE. Pharmacy initially consulted for IV heparin dosing. On 7/11, patient dislodged his own IV accidentally and suffered prolonged difficult to control bleeding, so heparin was held. Today, no further bleeding, so patient being started on Enoxaparin with plans to transition to Apixaban if no further bleeding on Enoxaparin.  Per notes, Cardiology recommending to stop Aspirin and continue Ticagrelor due to need for DOAC. CBC: Hgb 11.4, Pltc WNL. SCr 0.51 with CrCl ~ 52 ml/min.   Goal of Therapy:  Anti-Xa level 0.6-1 units/ml 4hrs after LMWH dose given Monitor platelets by anticoagulation protocol: Yes   Plan:  Enoxaparin '1mg'$ /kg ('70mg'$ ) SQ q12h Monitor CBC and for s/sx of bleeding   Lindell Spar, PharmD, BCPS Clinical Pharmacist  11/04/2021, 8:57 AM

## 2021-11-04 NOTE — Telephone Encounter (Signed)
The office received pt medical Records for Dr Sarajane Jews to sign, Shari Heritage is complete and has was mailed out to pt address provided, copy send to scanning

## 2021-11-05 ENCOUNTER — Other Ambulatory Visit (HOSPITAL_COMMUNITY): Payer: Self-pay

## 2021-11-05 DIAGNOSIS — R262 Difficulty in walking, not elsewhere classified: Secondary | ICD-10-CM | POA: Diagnosis not present

## 2021-11-05 DIAGNOSIS — R419 Unspecified symptoms and signs involving cognitive functions and awareness: Secondary | ICD-10-CM | POA: Diagnosis not present

## 2021-11-05 DIAGNOSIS — J9601 Acute respiratory failure with hypoxia: Secondary | ICD-10-CM | POA: Diagnosis not present

## 2021-11-05 DIAGNOSIS — N401 Enlarged prostate with lower urinary tract symptoms: Secondary | ICD-10-CM | POA: Diagnosis not present

## 2021-11-05 DIAGNOSIS — U071 COVID-19: Secondary | ICD-10-CM | POA: Diagnosis not present

## 2021-11-05 DIAGNOSIS — R29898 Other symptoms and signs involving the musculoskeletal system: Secondary | ICD-10-CM | POA: Diagnosis not present

## 2021-11-05 DIAGNOSIS — M6281 Muscle weakness (generalized): Secondary | ICD-10-CM | POA: Diagnosis not present

## 2021-11-05 DIAGNOSIS — R1312 Dysphagia, oropharyngeal phase: Secondary | ICD-10-CM | POA: Diagnosis not present

## 2021-11-05 DIAGNOSIS — Z9181 History of falling: Secondary | ICD-10-CM | POA: Diagnosis not present

## 2021-11-05 DIAGNOSIS — R1311 Dysphagia, oral phase: Secondary | ICD-10-CM | POA: Diagnosis not present

## 2021-11-05 DIAGNOSIS — R001 Bradycardia, unspecified: Secondary | ICD-10-CM | POA: Diagnosis not present

## 2021-11-05 DIAGNOSIS — J9600 Acute respiratory failure, unspecified whether with hypoxia or hypercapnia: Secondary | ICD-10-CM | POA: Diagnosis not present

## 2021-11-05 DIAGNOSIS — K219 Gastro-esophageal reflux disease without esophagitis: Secondary | ICD-10-CM | POA: Diagnosis not present

## 2021-11-05 LAB — CBC
HCT: 32.7 % — ABNORMAL LOW (ref 39.0–52.0)
Hemoglobin: 11.1 g/dL — ABNORMAL LOW (ref 13.0–17.0)
MCH: 31.9 pg (ref 26.0–34.0)
MCHC: 33.9 g/dL (ref 30.0–36.0)
MCV: 94 fL (ref 80.0–100.0)
Platelets: 162 10*3/uL (ref 150–400)
RBC: 3.48 MIL/uL — ABNORMAL LOW (ref 4.22–5.81)
RDW: 13.8 % (ref 11.5–15.5)
WBC: 9 10*3/uL (ref 4.0–10.5)
nRBC: 0 % (ref 0.0–0.2)

## 2021-11-05 MED ORDER — APIXABAN 5 MG PO TABS: 5.0000 mg | ORAL_TABLET | Freq: Two times a day (BID) | ORAL | Status: DC

## 2021-11-05 MED ORDER — METOPROLOL TARTRATE 25 MG PO TABS
25.0000 mg | ORAL_TABLET | Freq: Two times a day (BID) | ORAL | Status: DC
  Administered 2021-11-05: 25 mg via ORAL
  Filled 2021-11-05: qty 1

## 2021-11-05 MED ORDER — APIXABAN 5 MG PO TABS
5.0000 mg | ORAL_TABLET | Freq: Two times a day (BID) | ORAL | 0 refills | Status: DC
  Filled 2021-11-05: qty 60, 30d supply, fill #0

## 2021-11-05 NOTE — Progress Notes (Signed)
Physical Therapy Treatment Patient Details Name: Gabriel [name] MRN: [id number] DOB: 09/30/1924 Today's Date: 11/05/2021   History of Present Illness Gabriel [name] is a 88 y.o. male admitted 11/02/21 with generalized weakness, fall and COVID, PE, BLE edema L>R. PMH includes CAD, hypertension BPH dysphagia and GERD HLD neuropathy paroxysmal atrial fibrillation, ischemic cardiomyopathy, STEMI status post revascularization in February 2023    PT Comments    Patient make good progress, ambulated x 62' with RW, supervision. SPO2 100% .Patient to return to [facility] with PT.  Recommendations for follow up therapy are one component of a multi-disciplinary discharge planning process, led by the attending physician.  Recommendations may be updated based on patient status, additional functional criteria and insurance authorization.  Follow Up Recommendations  Home health PT     Assistance Recommended at Discharge PRN  Patient can return home with the following A little help with walking and/or transfers;A little help with bathing/dressing/bathroom;Assist for transportation;Assistance with cooking/housework;Help with stairs or ramp for entrance   Equipment Recommendations  None recommended by PT    Recommendations for Other Services       Precautions / Restrictions Precautions Precautions: Fall Precaution Comments: COVID     Mobility  Bed Mobility         Supine to sit: Supervision Sit to supine: Supervision        Transfers Overall transfer level: Needs assistance Equipment used: Rolling walker (2 wheels) Transfers: Sit to/from Stand Sit to Stand: Supervision                Ambulation/Gait Ambulation/Gait assistance: Supervision Gait Distance (Feet): 50 Feet Assistive device: Rolling walker (2 wheels) Gait Pattern/deviations: Step-through pattern, Decreased stride length Gait velocity: decr     General Gait Details: ambulated around room, able to turn,  back up with no loss of balance. Severe right knee genu recurvatum   Stairs             Wheelchair Mobility    Modified Rankin (Stroke Patients Only)       Balance   Sitting-balance support: Feet supported Sitting balance-Leahy Scale: Good       Standing balance-Leahy Scale: Fair Standing balance comment: stands at sink and washes hands, not holding                            Cognition Arousal/Alertness: Awake/alert Behavior During Therapy: WFL for tasks assessed/performed Overall Cognitive Status: Within Functional Limits for tasks assessed                                 General Comments: very HOH!        Exercises      General Comments        Pertinent Vitals/Pain Pain Assessment Pain Assessment: Faces Faces Pain Scale: Hurts little more Pain Location: right knee Pain Descriptors / Indicators: Discomfort Pain Intervention(s): Monitored during session    Home Living                          Prior Function            PT Goals (current goals can now be found in the care plan section) Progress towards PT goals: Progressing toward goals    Frequency    Min 3X/week      PT Plan Current plan remains appropriate  Co-evaluation              AM-PAC PT "6 Clicks" Mobility   Outcome Measure  Help needed turning from your back to your side while in a flat bed without using bedrails?: None Help needed moving from lying on your back to sitting on the side of a flat bed without using bedrails?: None Help needed moving to and from a bed to a chair (including a wheelchair)?: A Little Help needed standing up from a chair using your arms (e.g., wheelchair or bedside chair)?: A Little Help needed to walk in hospital room?: A Little Help needed climbing 3-5 steps with a railing? : A Little 6 Click Score: 20    End of Session Equipment Utilized During Treatment: Gait belt Activity Tolerance: Patient  tolerated treatment well Patient left: in bed;with call bell/phone within reach;with family/visitor present;with bed alarm set Nurse Communication: Mobility status PT Visit Diagnosis: Muscle weakness (generalized) (M62.81);Difficulty in walking, not elsewhere classified (R26.2)     Time: 7062-3762 PT Time Calculation (min) (ACUTE ONLY): 29 min  Charges:  $Gait Training: 23-37 mins                     [place] Office [phone] Weekend pager-[phone]    Claretha Cooper 11/05/2021, 11:13 AM

## 2021-11-05 NOTE — Discharge Instructions (Addendum)
Information on my medicine - ELIQUIS (apixaban)  This medication education was reviewed with me or my healthcare representative as part of my discharge preparation.  The pharmacist that spoke with me during my hospital stay was:  Barnet Pall, Student-PharmD  Why was Eliquis prescribed for you? Eliquis was prescribed to treat blood clots that may have been found in the veins of your legs (deep vein thrombosis) or in your lungs (pulmonary embolism) and to reduce the risk of them occurring again.  What do You need to know about Eliquis ?  The dose is ONE 5 mg tablet taken TWICE daily.  Eliquis may be taken with or without food.   Try to take the dose about the same time in the morning and in the evening. If you have difficulty swallowing the tablet whole please discuss with your pharmacist how to take the medication safely.  Take Eliquis exactly as prescribed and DO NOT stop taking Eliquis without talking to the doctor who prescribed the medication.  Stopping may increase your risk of developing a new blood clot.  Refill your prescription before you run out.  After discharge, you should have regular check-up appointments with your healthcare provider that is prescribing your Eliquis.    What do you do if you miss a dose? If a dose of ELIQUIS is not taken at the scheduled time, take it as soon as possible on the same day and twice-daily administration should be resumed. The dose should not be doubled to make up for a missed dose.  Important Safety Information A possible side effect of Eliquis is bleeding. You should call your healthcare provider right away if you experience any of the following: Bleeding from an injury or your nose that does not stop. Unusual colored urine (red or dark brown) or unusual colored stools (red or black). Unusual bruising for unknown reasons. A serious fall or if you hit your head (even if there is no bleeding).  Some medicines may interact with  Eliquis and might increase your risk of bleeding or clotting while on Eliquis. To help avoid this, consult your healthcare provider or pharmacist prior to using any new prescription or non-prescription medications, including herbals, vitamins, non-steroidal anti-inflammatory drugs (NSAIDs) and supplements.  This website has more information on Eliquis (apixaban): http://www.eliquis.com/eliquis/home

## 2021-11-05 NOTE — TOC Transition Note (Addendum)
Transition of Care [facility]) - CM/SW Discharge Note   Patient Details  Name: Gabriel [name] MRN: [id number] Date of Birth: [birth year]  [facility] CM/SW Contact:  Dessa Phi, RN Phone Number: 11/05/2021, 10:29 AM   Clinical Narrative:  d/c back to [facility] await response from rep,  confirm w/dtr [name] of her ability to transport back to ALF as stated in prior note, signature of MD on fl2,rm#,report tel# prior d/c.  -10:58a-per [facility] rep Katie-quarantine until 7/20 in rm#[phone],nsg call report tel#[phone]. Per nsg dtr [name] to transport back. No further CM needs.    Final next level of care: Assisted Living Barriers to Discharge: No Barriers Identified   Patient Goals and CMS Choice Patient states their goals for this hospitalization and ongoing recovery are:: ALF CMS Medicare.gov Compare Post Acute Care list provided to:: Patient Represenative (must comment) Choice offered to / list presented to : Adult Children  Discharge Placement                       Discharge Plan and Services   Discharge Planning Services: CM Consult Post Acute Care Choice: Home Health                               Social Determinants of Health (SDOH) Interventions     Readmission Risk Interventions     No data to display

## 2021-11-05 NOTE — NC FL2 (Signed)
[place] MEDICAID FL2 LEVEL OF CARE SCREENING TOOL     IDENTIFICATION  Patient Name: Gabriel [name] Birthdate: [birth year] Sex: male Admission Date (Current Location): 11/02/2021  [facility] and [place] Number:      Facility and Address:  [facility],  [address], [place]      Provider Number: [id number]  Attending Physician Name and Address:  Charlynne Cousins, MD  Relative Name and Phone Number:  [name] Garret(dtr)t [phone]    Current Level of Care: Hospital Recommended Level of Care: Assisted Living Facility Prior Approval Number:    Date Approved/Denied:   PASRR Number:    Discharge Plan: Other (Comment) ([facility] ALF)    Current Diagnoses: Patient Active Problem List   Diagnosis Date Noted   Acute respiratory failure due to COVID-19 ([place]) 11/02/2021   CAD S/P percutaneous coronary angioplasty 11/02/2021   COVID-19 virus infection 11/02/2021   Hyponatremia 11/02/2021   Elevated CK 11/02/2021   Edema of left lower leg 11/02/2021   Elevated troponin 11/02/2021   Hyperlipidemia 06/10/2021   Paroxysmal atrial fibrillation ([place]) 06/10/2021   STEMI involving left anterior descending coronary artery ([place]) 06/07/2021   STEMI (ST elevation myocardial infarction) ([place]) 06/06/2021   Osteoarthritis 03/21/2018   Neuropathy, peripheral 05/31/2017   BPH with urinary obstruction 09/23/2015   Dysphagia 05/07/2011   Allergic rhinitis 07/09/2009   EXTERNAL THROMBOSED HEMORRHOIDS 06/11/2009   Bradycardia 08/23/2007   DIVERTICULOSIS, COLON 08/23/2007   Essential hypertension 09/14/2006   GERD 09/14/2006    Orientation RESPIRATION BLADDER Height & Weight     Self, Time, Situation, Place  Normal Continent Weight: 71 kg Height:  '5\' 7"'$  (170.2 cm)  BEHAVIORAL SYMPTOMS/MOOD NEUROLOGICAL BOWEL NUTRITION STATUS      Continent Diet (Regular)  AMBULATORY STATUS COMMUNICATION OF NEEDS Skin   Limited Assist Verbally Normal                        Personal Care Assistance Level of Assistance  Bathing, Feeding, Dressing Bathing Assistance: Limited assistance Feeding assistance: Limited assistance Dressing Assistance: Limited assistance     Functional Limitations Info  Sight, Hearing, Speech Sight Info: Impaired (eyeglasses) Hearing Info: Adequate Speech Info: Adequate    SPECIAL CARE FACTORS FREQUENCY                       Contractures Contractures Info: Not present    Additional Factors Info  Code Status, Allergies Code Status Info:  (Full) Allergies Info:  (Contrast media)           Current Medications (11/05/2021):  This is the current hospital active medication list Current Facility-Administered Medications  Medication Dose Route Frequency Provider Last Rate Last Admin   0.9 %  sodium chloride infusion   Intravenous Continuous Charlynne Cousins, MD 10 mL/hr at 11/04/21 0934 Rate Change at 11/04/21 0934   acetaminophen (TYLENOL) tablet 650 mg  650 mg Oral Q6H PRN Toy Baker, MD       albuterol (PROVENTIL) (2.5 MG/3ML) 0.083% nebulizer solution 2.5 mg  2.5 mg Nebulization Q2H PRN Doutova, Anastassia, MD       apixaban (ELIQUIS) tablet 5 mg  5 mg Oral BID Charlynne Cousins, MD       atorvastatin (LIPITOR) tablet 40 mg  40 mg Oral Daily Doutova, Nyoka Lint, MD   40 mg at 11/05/21 0755   feeding supplement (ENSURE ENLIVE / ENSURE PLUS) liquid 237 mL  237 mL Oral  BID BM Charlynne Cousins, MD   237 mL at 11/05/21 0753   gabapentin (NEURONTIN) capsule 300 mg  300 mg Oral TID Toy Baker, MD   300 mg at 11/05/21 0755   guaiFENesin (MUCINEX) 12 hr tablet 600 mg  600 mg Oral BID PRN Cherene Altes, MD   600 mg at 11/05/21 0755   guaiFENesin-dextromethorphan (ROBITUSSIN DM) 100-10 MG/5ML syrup 10 mL  10 mL Oral Q4H PRN Toy Baker, MD       HYDROcodone-acetaminophen (NORCO/VICODIN) 5-325 MG per tablet 1-2 tablet  1-2 tablet Oral Q4H PRN Toy Baker, MD       metoprolol  tartrate (LOPRESSOR) tablet 25 mg  25 mg Oral BID Charlynne Cousins, MD   25 mg at 11/05/21 0949   ondansetron (ZOFRAN) tablet 4 mg  4 mg Oral Q6H PRN Toy Baker, MD       Or   ondansetron (ZOFRAN) injection 4 mg  4 mg Intravenous Q6H PRN Doutova, Anastassia, MD       pantoprazole (PROTONIX) EC tablet 40 mg  40 mg Oral Daily Doutova, Anastassia, MD   40 mg at 11/05/21 0755   sodium chloride flush (NS) 0.9 % injection 3 mL  3 mL Intravenous Q12H Doutova, Anastassia, MD   3 mL at 11/05/21 0800   ticagrelor (BRILINTA) tablet 90 mg  90 mg Oral BID Toy Baker, MD   90 mg at 11/05/21 7017     Discharge Medications: Please see discharge summary for a list of discharge medications.  Relevant Imaging Results:  Relevant Lab Results:   Additional Information ss#[id number] [place], [place]

## 2021-11-05 NOTE — Plan of Care (Signed)

## 2021-11-05 NOTE — Discharge Summary (Signed)
Physician Discharge Summary  BRIGG CAPE HOZ:[id number] DOB: 09-May-1924 DOA: 11/02/2021  PCP: Laurey Morale, MD  Admit date: 11/02/2021 Discharge date: 11/05/2021  Admitted From: ALF Disposition:  ALF  Recommendations for Outpatient Follow-up:  Follow up with PCP in 1-2 weeks Please obtain BMP/CBC in one week   Home Health:Yes Equipment/Devices:none  Discharge Condition:Stable CODE STATUS:DNR Diet recommendation: Heart Healthy   Brief/Interim Summary: 88 y.o. male past medical history of CAD, essential hypertension BPH chronic dysphagia, paroxysmal atrial fibrillation not on anticoagulation, neuropathy presents to the ED with generalized weakness and mechanical fall that happened 24 hours prior to admission was also experiencing low-grade fever was found to be COVID-positive at facility.  Discharge Diagnoses:  Principal Problem:   Acute respiratory failure due to COVID-19 [facility]) Active Problems:   COVID-19 virus infection   Bradycardia   CAD S/P percutaneous coronary angioplasty   GERD   Essential hypertension   Hyperlipidemia   Paroxysmal atrial fibrillation (HCC)   BPH with urinary obstruction   Hyponatremia   Elevated CK   Edema of left lower leg   Elevated troponin  COVID-pneumonia: He was initially started on steroids and remdesivir placed on oxygen but we were able to wean him to room air in less than 24 hours, remdesivir was stopped he was weaned off steroids. Physical therapy evaluated the patient and he will require home health PT.  Right segmental acute pulmonary embolism: He was started on IV heparin which she tolerated well. Had an episode of bleeding from his IV. He was transitioned to oral Eliquis. Aspirin was discontinued as this was discussed with a cardiologist. He will continue Brilinta.  Hypovolemic hyponatremia: Resolved with fluid resuscitation.  A-fib/bradycardia: His beta-blockers were held due to bradycardia. Once it resolved it was  resumed.  Left lower extremity edema: Lower extremity ultrasound was negative for DVT.  CAD status post DES stent to the LAD: With a left heart cath in February 2023 was previously on aspirin Brilinta and statins. It was discussed with the cardiologist, aspirin was discontinued he will continue Brilinta and DOAC.  Chronic atrial fibrillation: Rate controlled on metoprolol we will follow-up with Dr. Claiborne Billings as an outpatient.  Hypokalemia: Replete orally now resolved.  Central hypertension: No changes made continue current medications.  Hyperlipidemia: Continue statins.  Discharge Instructions  Discharge Instructions     Diet - low sodium heart healthy   Complete by: As directed    Increase activity slowly   Complete by: As directed    No wound care   Complete by: As directed       Allergies as of 11/05/2021       Reactions   Contrast Media [iodinated Contrast Media] Rash        Medication List     STOP taking these medications    aspirin 81 MG tablet       TAKE these medications    acetaminophen 325 MG tablet Commonly known as: TYLENOL Take 650 mg by mouth every 6 (six) hours as needed for mild pain or headache.   apixaban 5 MG Tabs tablet Commonly known as: ELIQUIS Take 1 tablet (5 mg total) by mouth 2 (two) times daily.   atorvastatin 40 MG tablet Commonly known as: LIPITOR Take 1 tablet (40 mg total) by mouth daily.   gabapentin 300 MG capsule Commonly known as: NEURONTIN TAKE 1 CAPSULE BY MOUTH THREE TIMES A DAY What changed: See the new instructions.   metoprolol tartrate 25 MG tablet Commonly known as:  LOPRESSOR Take 1 tablet (25 mg total) by mouth 2 (two) times daily.   nitroGLYCERIN 0.4 MG SL tablet Commonly known as: Nitrostat Place 1 tablet (0.4 mg total) under the tongue every 5 (five) minutes as needed for chest pain.   pantoprazole 40 MG tablet Commonly known as: PROTONIX Take 1 tablet (40 mg total) by mouth daily.    potassium chloride 10 MEQ tablet Commonly known as: Klor-Con 10 Take 1 tablet (10 mEq total) by mouth daily.   ticagrelor 90 MG Tabs tablet Commonly known as: BRILINTA Take 1 tablet (90 mg total) by mouth 2 (two) times daily.   vitamin C 500 MG tablet Commonly known as: ASCORBIC ACID Take 500 mg by mouth daily. Reported on 10/27/2015        Allergies  Allergen Reactions   Contrast Media [Iodinated Contrast Media] Rash    Consultations: None  Procedures/Studies: VAS [place] LOWER EXTREMITY VENOUS (DVT)  Result Date: 11/03/2021  Lower Venous DVT Study Patient Name:  DACEN FRAYRE  Date of Exam:   11/03/2021 Medical Rec #: [id number]        Accession #:    [id number] Date of Birth: 02-19-1925        Patient Gender: M Patient Age:   60 years Exam Location:  [facility] Procedure:      VAS [place] LOWER EXTREMITY VENOUS (DVT) Referring Phys: Nyoka Lint DOUTOVA --------------------------------------------------------------------------------  Indications: Edema, and Covid positive.  Limitations: Poor ultrasound/tissue interface. Comparison Study: No prior study Performing Technologist: Maudry Mayhew MHA, RDMS, RVT, RDCS  Examination Guidelines: A complete evaluation includes B-mode imaging, spectral Doppler, color Doppler, and power Doppler as needed of all accessible portions of each vessel. Bilateral testing is considered an integral part of a complete examination. Limited examinations for reoccurring indications may be performed as noted. The reflux portion of the exam is performed with the patient in reverse Trendelenburg.  +---------+---------------+---------+-----------+----------+--------------+ RIGHT    CompressibilityPhasicitySpontaneityPropertiesThrombus Aging +---------+---------------+---------+-----------+----------+--------------+ CFV      Full           Yes      Yes                                  +---------+---------------+---------+-----------+----------+--------------+ SFJ      Full                                                        +---------+---------------+---------+-----------+----------+--------------+ FV Prox  Full                                                        +---------+---------------+---------+-----------+----------+--------------+ FV Mid   Full                                                        +---------+---------------+---------+-----------+----------+--------------+ FV DistalFull                                                        +---------+---------------+---------+-----------+----------+--------------+  PFV      Full                                                        +---------+---------------+---------+-----------+----------+--------------+ POP      Full           Yes      Yes                                 +---------+---------------+---------+-----------+----------+--------------+   Right Technical Findings: Not visualized segments include PTV, peroneal veins.  +---------+---------------+---------+-----------+----------+--------------+ LEFT     CompressibilityPhasicitySpontaneityPropertiesThrombus Aging +---------+---------------+---------+-----------+----------+--------------+ CFV      Full           Yes      Yes                                 +---------+---------------+---------+-----------+----------+--------------+ SFJ      Full                                                        +---------+---------------+---------+-----------+----------+--------------+ FV Prox  Full                                                        +---------+---------------+---------+-----------+----------+--------------+ FV Mid   Full                                                        +---------+---------------+---------+-----------+----------+--------------+ FV DistalFull                                                         +---------+---------------+---------+-----------+----------+--------------+ PFV      Full                                                        +---------+---------------+---------+-----------+----------+--------------+ POP      Full           Yes      Yes                                 +---------+---------------+---------+-----------+----------+--------------+ PTV      Full                                                        +---------+---------------+---------+-----------+----------+--------------+  PERO     Full                                                        +---------+---------------+---------+-----------+----------+--------------+     Summary: RIGHT: - There is no evidence of deep vein thrombosis in the lower extremity. However, portions of this examination were limited- see technologist comments above.  - No cystic structure found in the popliteal fossa.  LEFT: - There is no evidence of deep vein thrombosis in the lower extremity.  - No cystic structure found in the popliteal fossa.  *See table(s) above for measurements and observations. Electronically signed by Servando Snare MD on 11/03/2021 at 2:59:45 PM.    Final    ECHOCARDIOGRAM COMPLETE  Result Date: 11/03/2021    ECHOCARDIOGRAM REPORT   Patient Name:   SANTOS SOLLENBERGER Date of Exam: 11/03/2021 Medical Rec #:  [id number]       Height:       67.0 in Accession #:    [id number]      Weight:       156.5 lb Date of Birth:  10/10/1924       BSA:          1.822 m Patient Age:    89 years        BP:           151/63 mmHg Patient Gender: M               HR:           59 bpm. Exam Location:  Inpatient Procedure: 2D Echo, Cardiac Doppler and Color Doppler Indications:    Elevated troponin  History:        Patient has prior history of Echocardiogram examinations. CAD,                 Arrythmias:Atrial Fibrillation; Risk Factors:Hypertension.  Sonographer:    Jyl Heinz Referring Phys: [place]  1. Left ventricular ejection fraction, by estimation, is 65 to 70%. The left ventricle has normal function. The left ventricle has no regional wall motion abnormalities. Left ventricular diastolic parameters are consistent with Grade II diastolic dysfunction (pseudonormalization). Elevated left atrial pressure.  2. Right ventricular systolic function is normal. The right ventricular size is normal. There is mildly elevated pulmonary artery systolic pressure. The estimated right ventricular systolic pressure is 96.2 mmHg.  3. Left atrial size was mildly dilated.  4. The mitral valve is normal in structure. Mild mitral valve regurgitation.  5. The aortic valve is tricuspid. There is mild calcification of the aortic valve. There is mild thickening of the aortic valve. Aortic valve regurgitation is trivial. Aortic valve sclerosis is present, with no evidence of aortic valve stenosis.  6. The inferior vena cava is dilated in size with >50% respiratory variability, suggesting right atrial pressure of 8 mmHg. Comparison(s): Prior images reviewed side by side. The left ventricular function has improved. The left ventricular wall motion abnormality is improved. FINDINGS  Left Ventricle: Left ventricular ejection fraction, by estimation, is 65 to 70%. The left ventricle has normal function. The left ventricle has no regional wall motion abnormalities. The left ventricular internal cavity size was normal in size. There is  borderline concentric left ventricular hypertrophy. Left ventricular diastolic parameters are consistent with Grade II diastolic  dysfunction (pseudonormalization). Elevated left atrial pressure. Right Ventricle: The right ventricular size is normal. No increase in right ventricular wall thickness. Right ventricular systolic function is normal. There is mildly elevated pulmonary artery systolic pressure. The tricuspid regurgitant velocity is 2.81  m/s, and with an assumed right  atrial pressure of 8 mmHg, the estimated right ventricular systolic pressure is 11.9 mmHg. Left Atrium: Left atrial size was mildly dilated. Right Atrium: Right atrial size was normal in size. Prominent Eustachian valve. Pericardium: There is no evidence of pericardial effusion. Mitral Valve: The mitral valve is normal in structure. Mild mitral valve regurgitation, with centrally-directed jet. Tricuspid Valve: The tricuspid valve is normal in structure. Tricuspid valve regurgitation is mild. Aortic Valve: The aortic valve is tricuspid. There is mild calcification of the aortic valve. There is mild thickening of the aortic valve. Aortic valve regurgitation is trivial. Aortic regurgitation PHT measures 721 msec. Aortic valve sclerosis is present, with no evidence of aortic valve stenosis. Aortic valve peak gradient measures 4.8 mmHg. Pulmonic Valve: The pulmonic valve was grossly normal. Pulmonic valve regurgitation is not visualized. Aorta: The aortic root and ascending aorta are structurally normal, with no evidence of dilitation. Venous: The inferior vena cava is dilated in size with greater than 50% respiratory variability, suggesting right atrial pressure of 8 mmHg. IAS/Shunts: No atrial level shunt detected by color flow Doppler.  LEFT VENTRICLE PLAX 2D LVIDd:         3.90 cm     Diastology LVIDs:         2.40 cm     LV e' medial:    4.79 cm/s LV PW:         1.10 cm     LV E/e' medial:  17.7 LV IVS:        1.20 cm     LV e' lateral:   6.53 cm/s LVOT diam:     2.00 cm     LV E/e' lateral: 13.0 LV SV:         56 LV SV Index:   31 LVOT Area:     3.14 cm  LV Volumes (MOD) LV vol d, MOD A2C: 98.5 ml LV vol d, MOD A4C: 99.1 ml LV vol s, MOD A2C: 36.3 ml LV vol s, MOD A4C: 36.8 ml LV SV MOD A2C:     62.2 ml LV SV MOD A4C:     99.1 ml LV SV MOD BP:      62.0 ml RIGHT VENTRICLE             IVC RV Basal diam:  3.40 cm     IVC diam: 2.20 cm RV Mid diam:    2.40 cm RV S prime:     10.90 cm/s TAPSE (M-mode): 1.9 cm LEFT  ATRIUM             Index        RIGHT ATRIUM           Index LA diam:        4.00 cm 2.20 cm/m   RA Area:     13.80 cm LA Vol (A2C):   74.7 ml 40.99 ml/m  RA Volume:   33.70 ml  18.49 ml/m LA Vol (A4C):   70.9 ml 38.91 ml/m LA Biplane Vol: 73.6 ml 40.39 ml/m  AORTIC VALVE AV Area (Vmax): 2.24 cm AV Vmax:        109.00 cm/s AV Peak Grad:   4.8 mmHg LVOT Vmax:  77.70 cm/s LVOT Vmean:     51.900 cm/s LVOT VTI:       0.178 m AI PHT:         721 msec  AORTA Ao Root diam: 3.30 cm Ao Asc diam:  3.10 cm MITRAL VALVE               TRICUSPID VALVE MV Area (PHT): 2.72 cm    TR Peak grad:   31.6 mmHg MV Decel Time: 279 msec    TR Vmax:        281.00 cm/s MV E velocity: 84.84 cm/s MV A velocity: 89.10 cm/s  SHUNTS MV E/A ratio:  0.95        Systemic VTI:  0.18 m                            Systemic Diam: 2.00 cm Sanda Klein MD Electronically signed by Sanda Klein MD Signature Date/Time: 11/03/2021/9:02:33 AM    Final    CT Angio Chest PE W and/or Wo Contrast  Addendum Date: 11/03/2021   ADDENDUM REPORT: 11/03/2021 04:08 ADDENDUM: These results were called by telephone at the time of interpretation on 11/03/2021 at 4:02 am to provider Yvetta Coder, the floor nurse caring for the patient, who verbally acknowledged these results. Electronically Signed   By: Julian Hy M.D.   On: 11/03/2021 04:08   Result Date: 11/03/2021 CLINICAL DATA:  Fever/chills, COVID, positive D-dimer. EXAM: CT ANGIOGRAPHY CHEST WITH CONTRAST TECHNIQUE: Multidetector CT imaging of the chest was performed using the standard protocol during bolus administration of intravenous contrast. Multiplanar CT image reconstructions and MIPs were obtained to evaluate the vascular anatomy. RADIATION DOSE REDUCTION: This exam was performed according to the departmental dose-optimization program which includes automated exposure control, adjustment of the mA and/or kV according to patient size and/or use of iterative reconstruction technique. CONTRAST:   74m OMNIPAQUE IOHEXOL 350 MG/ML SOLN COMPARISON:  Chest radiograph dated 11/02/2021. CT chest dated 09/04/2013. FINDINGS: Cardiovascular: Satisfactory opacification of the bilateral pulmonary arteries to the lobar level. Evaluation is constrained by motion degradation, particularly in the bilateral lower lobes. Within that constraint, the study is positive for lobar and segmental pulmonary emboli in branches of the right upper lobe (series 5/images 98, 101, and 109). Overall clot burden is small. The heart is normal in size. Elevated RV to LV ratio (1.3), raising the possibility of right heart strain. Retrograde opacification of the infrahepatic IVC. Although not tailored for evaluation of the thoracic aorta, there is no evidence thoracic aortic aneurysm. Atherosclerotic calcifications of the aortic arch. Mediastinum/Nodes: No suspicious mediastinal lymphadenopathy. Visualized thyroid is unremarkable. Lungs/Pleura: Evaluation of the lung parenchyma is constrained by respiratory motion. Within that constraint, there are no suspicious pulmonary nodules. Mild subpleural patchy opacities in the lungs bilaterally, lower lobe predominant, possibly related to the patient's known COVID pneumonia. No pleural effusion or pneumothorax. Upper Abdomen: Visualized upper abdomen is notable for a small hiatal hernia. Vascular calcifications. Musculoskeletal: Degenerative changes of the lower thoracic spine. Review of the MIP images confirms the above findings. IMPRESSION: Lobar and segmental pulmonary emboli in branches of the right upper lobe. Overall clot burden is small. Elevated RV to LV ratio (1.3), raising the possibility of right heart strain. Positive for acute PE with CT evidence of right heart strain (RV/LV Ratio = 1.3) consistent with at least submassive (intermediate risk) PE. The presence of right heart strain has been associated with an increased risk of morbidity  and mortality. Please refer to the "Code PE Focused"  order set in EPIC. Mild subpleural patchy opacities in the lungs bilaterally, lower lobe predominant, possibly related to the patient's known COVID pneumonia. Aortic Atherosclerosis (ICD10-I70.0). Electronically Signed: By: Julian Hy M.D. On: 11/03/2021 03:37   DG Chest Portable 1 View  Result Date: 11/02/2021 CLINICAL DATA:  COVID.  Weakness, fever and chills. EXAM: PORTABLE CHEST 1 VIEW COMPARISON:  Chest radiograph 06/07/2021 FINDINGS: Patchy and heterogeneous bilateral lower lung zone opacities, left greater than right. Lower lung volumes from prior exam. Upper normal heart size. Aortic atherosclerosis. No pulmonary edema, large pleural effusion, or pneumothorax. No acute osseous findings. IMPRESSION: Patchy and heterogeneous bilateral lower lung zone opacities, left greater than right, suspicious for pneumonia in the setting of COVID. Electronically Signed   By: Keith Rake M.D.   On: 11/02/2021 17:09   (Echo, Carotid, EGD, Colonoscopy, ERCP)    Subjective:   Discharge Exam: Vitals:   11/04/21 2151 11/05/21 0358  BP: 138/69 139/61  Pulse: (!) 57 (!) 47  Resp: 16 16  Temp: 98.5 F (36.9 C) 97.8 F (36.6 C)  SpO2: 97% 97%   Vitals:   11/04/21 0414 11/04/21 1447 11/04/21 2151 11/05/21 0358  BP: (!) 132/59 (!) 139/50 138/69 139/61  Pulse: (!) 50 (!) 55 (!) 57 (!) 47  Resp: '18 20 16 16  '$ Temp: (!) 97.5 F (36.4 C) 97.9 F (36.6 C) 98.5 F (36.9 C) 97.8 F (36.6 C)  TempSrc:   Oral Oral  SpO2: 97% 95% 97% 97%  Weight:      Height:        General: Pt is alert, awake, not in acute distress Cardiovascular: RRR, S1/S2 +, no rubs, no gallops Respiratory: CTA bilaterally, no wheezing, no rhonchi Abdominal: Soft, NT, ND, bowel sounds + Extremities: no edema, no cyanosis    The results of significant diagnostics from this hospitalization (including imaging, microbiology, ancillary and laboratory) are listed below for reference.     Microbiology: Recent Results  (from the past 240 hour(s))  Resp Panel by RT-PCR (Flu A&B, Covid) Anterior Nasal Swab     Status: Abnormal   Collection Time: 11/02/21  7:18 PM   Specimen: Anterior Nasal Swab  Result Value Ref Range Status   SARS Coronavirus 2 by RT PCR POSITIVE (A) NEGATIVE Final    Comment: (NOTE) SARS-CoV-2 target nucleic acids are DETECTED.  The SARS-CoV-2 RNA is generally detectable in upper respiratory specimens during the acute phase of infection. Positive results are indicative of the presence of the identified virus, but do not rule out bacterial infection or co-infection with other pathogens not detected by the test. Clinical correlation with patient history and other diagnostic information is necessary to determine patient infection status. The expected result is Negative.  Fact Sheet for Patients: EntrepreneurPulse.com.au  Fact Sheet for Healthcare Providers: IncredibleEmployment.be  This test is not yet approved or cleared by the [place] FDA and  has been authorized for detection and/or diagnosis of SARS-CoV-2 by FDA under an Emergency Use Authorization (EUA).  This EUA will remain in effect (meaning this test can be used) for the duration of  the COVID-19 declaration under Section 564(b)(1) of the A ct, 21 U.S.C. section 360bbb-3(b)(1), unless the authorization is terminated or revoked sooner.     Influenza A by PCR NEGATIVE NEGATIVE Final   Influenza B by PCR NEGATIVE NEGATIVE Final    Comment: (NOTE) The Xpert Xpress SARS-CoV-2/FLU/RSV plus assay is intended as an aid  in the diagnosis of influenza from Nasopharyngeal swab specimens and should not be used as a sole basis for treatment. Nasal washings and aspirates are unacceptable for Xpert Xpress SARS-CoV-2/FLU/RSV testing.  Fact Sheet for Patients: EntrepreneurPulse.com.au  Fact Sheet for Healthcare Providers: IncredibleEmployment.be  This  test is not yet approved or cleared by the [place] FDA and has been authorized for detection and/or diagnosis of SARS-CoV-2 by FDA under an Emergency Use Authorization (EUA). This EUA will remain in effect (meaning this test can be used) for the duration of the COVID-19 declaration under Section 564(b)(1) of the Act, 21 U.S.C. section 360bbb-3(b)(1), unless the authorization is terminated or revoked.  Performed at [facility], [place] [address], [place], [place] [address]   MRSA Next Gen by PCR, Nasal     Status: None   Collection Time: 11/03/21  1:17 AM   Specimen: Nasal Mucosa; Nasal Swab  Result Value Ref Range Status   MRSA by PCR Next Gen NOT DETECTED NOT DETECTED Final    Comment: (NOTE) The GeneXpert MRSA Assay (FDA approved for NASAL specimens only), is one component of a comprehensive MRSA colonization surveillance program. It is not intended to diagnose MRSA infection nor to guide or monitor treatment for MRSA infections. Test performance is not FDA approved in patients less than 68 years old. Performed at [facility], [place] [address] [place], [place] [address]      Labs: BNP (last 3 results) Recent Labs    11/02/21 1640 11/03/21 0537  BNP 256.0* 151.7*   Basic Metabolic Panel: Recent Labs  Lab 11/02/21 1640 11/02/21 1648 11/02/21 2130 11/03/21 0537 11/04/21 0548  NA 132*  --  139 133*  132* 134*  K 3.7  --  2.4* 4.1  4.0 3.8  CL 105  --  120* 104  104 108  CO2 18*  --  12* 21*  21* 19*  GLUCOSE 116*  --  97 155*  162* 154*  BUN 18  --  '11 16  16 16  '$ CREATININE 0.72  --  0.36* 0.51*  0.52* 0.51*  CALCIUM 8.5*  --  5.6* 8.3*  8.3* 8.0*  MG 1.9  --  1.3* 1.9  --   PHOS  --  3.3  --  4.4  --    Liver Function Tests: Recent Labs  Lab 11/02/21 2130 11/03/21 0537 11/04/21 0548  AST 45* 61* 63*  ALT 24 39 37  ALKPHOS 52 83 74  BILITOT 1.3* 1.5* 0.8  PROT 3.7* 5.9* 5.2*  ALBUMIN 1.8* 3.1* 2.7*   No  results for input(s): "LIPASE", "AMYLASE" in the last 168 hours. No results for input(s): "AMMONIA" in the last 168 hours. CBC: Recent Labs  Lab 11/02/21 1640 11/03/21 0537 11/03/21 1737 11/04/21 0548 11/05/21 0450  WBC 7.0 3.8* 12.0* 9.7 9.0  NEUTROABS 4.7 2.9  --   --   --   HGB 12.3* 12.7* 12.1* 11.4* 11.1*  HCT 35.9* 37.0* 35.6* 33.3* 32.7*  MCV 93.5 92.7 92.0 93.0 94.0  PLT 177 163 181 157 162   Cardiac Enzymes: Recent Labs  Lab 11/02/21 1855 11/03/21 0537  CKTOTAL 495* 382   BNP: Invalid input(s): "POCBNP" CBG: No results for input(s): "GLUCAP" in the last 168 hours. D-Dimer Recent Labs    11/02/21 1648 11/03/21 0537  DDIMER 2.21* 1.40*   Hgb A1c No results for input(s): "HGBA1C" in the last 72 hours. Lipid Profile No results for input(s): "CHOL", "HDL", "LDLCALC", "TRIG", "CHOLHDL", "  LDLDIRECT" in the last 72 hours. Thyroid function studies Recent Labs    11/02/21 1648  TSH 2.039   Anemia work up Recent Labs    11/02/21 1648 11/03/21 0537  FERRITIN 228 256   Urinalysis    Component Value Date/Time   BILIRUBINUR negative 01/02/2018 1138   PROTEINUR Positive (A) 01/02/2018 1138   UROBILINOGEN 2.0 (A) 01/02/2018 1138   NITRITE negative 01/02/2018 1138   LEUKOCYTESUR Negative 01/02/2018 1138   Sepsis Labs Recent Labs  Lab 11/03/21 0537 11/03/21 1737 11/04/21 0548 11/05/21 0450  WBC 3.8* 12.0* 9.7 9.0   Microbiology Recent Results (from the past 240 hour(s))  Resp Panel by RT-PCR (Flu A&B, Covid) Anterior Nasal Swab     Status: Abnormal   Collection Time: 11/02/21  7:18 PM   Specimen: Anterior Nasal Swab  Result Value Ref Range Status   SARS Coronavirus 2 by RT PCR POSITIVE (A) NEGATIVE Final    Comment: (NOTE) SARS-CoV-2 target nucleic acids are DETECTED.  The SARS-CoV-2 RNA is generally detectable in upper respiratory specimens during the acute phase of infection. Positive results are indicative of the presence of the identified  virus, but do not rule out bacterial infection or co-infection with other pathogens not detected by the test. Clinical correlation with patient history and other diagnostic information is necessary to determine patient infection status. The expected result is Negative.  Fact Sheet for Patients: EntrepreneurPulse.com.au  Fact Sheet for Healthcare Providers: IncredibleEmployment.be  This test is not yet approved or cleared by the [place] FDA and  has been authorized for detection and/or diagnosis of SARS-CoV-2 by FDA under an Emergency Use Authorization (EUA).  This EUA will remain in effect (meaning this test can be used) for the duration of  the COVID-19 declaration under Section 564(b)(1) of the A ct, 21 U.S.C. section 360bbb-3(b)(1), unless the authorization is terminated or revoked sooner.     Influenza A by PCR NEGATIVE NEGATIVE Final   Influenza B by PCR NEGATIVE NEGATIVE Final    Comment: (NOTE) The Xpert Xpress SARS-CoV-2/FLU/RSV plus assay is intended as an aid in the diagnosis of influenza from Nasopharyngeal swab specimens and should not be used as a sole basis for treatment. Nasal washings and aspirates are unacceptable for Xpert Xpress SARS-CoV-2/FLU/RSV testing.  Fact Sheet for Patients: EntrepreneurPulse.com.au  Fact Sheet for Healthcare Providers: IncredibleEmployment.be  This test is not yet approved or cleared by the [place] FDA and has been authorized for detection and/or diagnosis of SARS-CoV-2 by FDA under an Emergency Use Authorization (EUA). This EUA will remain in effect (meaning this test can be used) for the duration of the COVID-19 declaration under Section 564(b)(1) of the Act, 21 U.S.C. section 360bbb-3(b)(1), unless the authorization is terminated or revoked.  Performed at [facility], [place] [address], [place], [place] [address]   MRSA Next  Gen by PCR, Nasal     Status: None   Collection Time: 11/03/21  1:17 AM   Specimen: Nasal Mucosa; Nasal Swab  Result Value Ref Range Status   MRSA by PCR Next Gen NOT DETECTED NOT DETECTED Final    Comment: (NOTE) The GeneXpert MRSA Assay (FDA approved for NASAL specimens only), is one component of a comprehensive MRSA colonization surveillance program. It is not intended to diagnose MRSA infection nor to guide or monitor treatment for MRSA infections. Test performance is not FDA approved in patients less than 2 years old. Performed at [facility], [place] [address] [place], [place]  [address]     SIGNED:   Charlynne Cousins, MD  [facility] 11/05/2021, 9:48 AM Pager   If 7PM-7AM, please contact night-coverage [email or web address] Password [email or web address]

## 2021-11-05 NOTE — Progress Notes (Signed)
[facility] PROGRESS NOTE    Progress Note  Gabriel [name]  HAL:[id number] DOB: 11/13/1924 DOA: 11/02/2021 PCP: Laurey Morale, MD     Brief Narrative:   Gabriel [name] is an 88 y.o. male past medical history of CAD, essential hypertension BPH chronic dysphagia, paroxysmal atrial fibrillation not on anticoagulation, neuropathy presents to the ED with generalized weakness and mechanical fall that happened 24 hours prior to admission was also experiencing low-grade fever was found to be COVID-positive at facility.    Assessment/Plan:   COVID-pneumonia: Agree with holding remdesivir and Paxlovid. We will try to wean off steroids. Wean to room air, out of bed to chair.  Right segmental acute pulmonary embolism: As tolerated Lovenox we will transition him to oral Eliquis and monitor for 24 hours. Discussed with cardiology recommended to drop the aspirin, continue Lovenox and Brilinta. If he has no further bleeding on this can transition to Eliquis low-dose and continue Brilinta.  Hyponatremia: Likely due to hypovolemia slowly with fluid resuscitation. KVO IV fluids. We will allow oral hydration.  Bradycardia: Continue to hold beta-blocker. Check a twelve-lead EKG to rule out A-fib.  Edema of the left lower extremity: Venous dark plaques without evidence of DVT.  CAD status post DES stent to the LAD: Left heart cath in June 10, 2021 currently on aspirin Brilinta statin. Discussed with cardiology discontinue aspirin as he will be on a DOAC  Chronic paroxysmal atrial fibrillation: Rate control will have to be on Brilinta and a DOAC discussed with cardiology will need to follow-up with Dr. Claiborne Billings as an outpatient.  Chronic combined systolic and diastolic heart failure: With an EF of 40% back in February 2023. Continue current home regimen.  Hypokalemia: Repleted orally, now resolved.  Essential hypertension: Metoprolol held due to  bradycardia.  Hyperlipidemia: Continue statins.  Elevated troponins: Likely due to PE not trending down.    DVT prophylaxis: lovenox Family Communication:daughter Status is: Inpatient Remains inpatient appropriate because: Acute PE    Code Status:     Code Status Orders  (From admission, onward)           Start     Ordered   11/02/21 2342  Full code  Continuous        11/02/21 2341           Code Status History     Date Active Date Inactive Code Status Order ID Comments User Context   11/02/2021 2336 11/02/2021 2341 DNR [id number]  Toy Baker, MD Inpatient   11/02/2021 2240 11/02/2021 2336 Full Code [id number]  Toy Baker, MD ED   11/02/2021 1952 11/02/2021 2240 Full Code [id number]  Toy Baker, MD ED   06/07/2021 0024 06/10/2021 1820 Full Code [id number]  End, Harrell Gave, MD Inpatient      Advance Directive Documentation    Flowsheet Row Most Recent Value  Type of Advance Directive Living will  Pre-existing out of facility DNR order (yellow form or pink MOST form) --  "MOST" Form in Place? --         IV Access:   Peripheral IV   Procedures and diagnostic studies:   VAS [place] LOWER EXTREMITY VENOUS (DVT)  Result Date: 11/03/2021  Lower Venous DVT Study Patient Name:  Gabriel [name]  Date of Exam:   11/03/2021 Medical Rec #: [id number]        Accession #:    [id number] Date of Birth: 1924-05-13        Patient Gender: M Patient Age:  88 years Exam Location:  [facility] Procedure:      VAS [place] LOWER EXTREMITY VENOUS (DVT) Referring Phys: ANASTASSIA DOUTOVA --------------------------------------------------------------------------------  Indications: Edema, and Covid positive.  Limitations: Poor ultrasound/tissue interface. Comparison Study: No prior study Performing Technologist: Maudry Mayhew MHA, RDMS, RVT, RDCS  Examination Guidelines: A complete evaluation includes B-mode imaging, spectral Doppler, color Doppler, and power  Doppler as needed of all accessible portions of each vessel. Bilateral testing is considered an integral part of a complete examination. Limited examinations for reoccurring indications may be performed as noted. The reflux portion of the exam is performed with the patient in reverse Trendelenburg.  +---------+---------------+---------+-----------+----------+--------------+ RIGHT    CompressibilityPhasicitySpontaneityPropertiesThrombus Aging +---------+---------------+---------+-----------+----------+--------------+ CFV      Full           Yes      Yes                                 +---------+---------------+---------+-----------+----------+--------------+ SFJ      Full                                                        +---------+---------------+---------+-----------+----------+--------------+ FV Prox  Full                                                        +---------+---------------+---------+-----------+----------+--------------+ FV Mid   Full                                                        +---------+---------------+---------+-----------+----------+--------------+ FV DistalFull                                                        +---------+---------------+---------+-----------+----------+--------------+ PFV      Full                                                        +---------+---------------+---------+-----------+----------+--------------+ POP      Full           Yes      Yes                                 +---------+---------------+---------+-----------+----------+--------------+   Right Technical Findings: Not visualized segments include PTV, peroneal veins.  +---------+---------------+---------+-----------+----------+--------------+ LEFT     CompressibilityPhasicitySpontaneityPropertiesThrombus Aging +---------+---------------+---------+-----------+----------+--------------+ CFV      Full           Yes      Yes                                  +---------+---------------+---------+-----------+----------+--------------+  SFJ      Full                                                        +---------+---------------+---------+-----------+----------+--------------+ FV Prox  Full                                                        +---------+---------------+---------+-----------+----------+--------------+ FV Mid   Full                                                        +---------+---------------+---------+-----------+----------+--------------+ FV DistalFull                                                        +---------+---------------+---------+-----------+----------+--------------+ PFV      Full                                                        +---------+---------------+---------+-----------+----------+--------------+ POP      Full           Yes      Yes                                 +---------+---------------+---------+-----------+----------+--------------+ PTV      Full                                                        +---------+---------------+---------+-----------+----------+--------------+ PERO     Full                                                        +---------+---------------+---------+-----------+----------+--------------+     Summary: RIGHT: - There is no evidence of deep vein thrombosis in the lower extremity. However, portions of this examination were limited- see technologist comments above.  - No cystic structure found in the popliteal fossa.  LEFT: - There is no evidence of deep vein thrombosis in the lower extremity.  - No cystic structure found in the popliteal fossa.  *See table(s) above for measurements and observations. Electronically signed by Servando Snare MD on 11/03/2021 at 2:59:45 PM.    Final      Medical Consultants:   None.   Subjective:    Gabriel [name]   Objective:  Vitals:   11/04/21 0414 11/04/21  1447 11/04/21 2151 11/05/21 0358  BP: (!) 132/59 (!) 139/50 138/69 139/61  Pulse: (!) 50 (!) 55 (!) 57 (!) 47  Resp: '18 20 16 16  '$ Temp: (!) 97.5 F (36.4 C) 97.9 F (36.6 C) 98.5 F (36.9 C) 97.8 F (36.6 C)  TempSrc:   Oral Oral  SpO2: 97% 95% 97% 97%  Weight:      Height:       SpO2: 97 % O2 Flow Rate (L/min): 2 L/min   Intake/Output Summary (Last 24 hours) at 11/05/2021 0936 Last data filed at 11/05/2021 0800 Gross per 24 hour  Intake 240 ml  Output 950 ml  Net -710 ml    Filed Weights   11/02/21 2200 11/03/21 0844  Weight: 71 kg 71 kg    Exam: General exam: In no acute distress. Respiratory system: Good air movement and clear to auscultation. Cardiovascular system: S1 & S2 heard, RRR. No JVD, murmurs, rubs, gallops or clicks.  Gastrointestinal system: Abdomen is nondistended, soft and nontender.  Central nervous system: Alert and oriented. No focal neurological deficits. Extremities: No pedal edema. Skin: No rashes, lesions or ulcers Psychiatry: Judgement and insight appear normal. Mood & affect appropriate.    Data Reviewed:    Labs: Basic Metabolic Panel: Recent Labs  Lab 11/02/21 1640 11/02/21 1648 11/02/21 2130 11/03/21 0537 11/04/21 0548  NA 132*  --  139 133*  132* 134*  K 3.7  --  2.4* 4.1  4.0 3.8  CL 105  --  120* 104  104 108  CO2 18*  --  12* 21*  21* 19*  GLUCOSE 116*  --  97 155*  162* 154*  BUN 18  --  '11 16  16 16  '$ CREATININE 0.72  --  0.36* 0.51*  0.52* 0.51*  CALCIUM 8.5*  --  5.6* 8.3*  8.3* 8.0*  MG 1.9  --  1.3* 1.9  --   PHOS  --  3.3  --  4.4  --     GFR Estimated Creatinine Clearance: 51.6 mL/min (A) (by C-G formula based on SCr of 0.51 mg/dL (L)). Liver Function Tests: Recent Labs  Lab 11/02/21 2130 11/03/21 0537 11/04/21 0548  AST 45* 61* 63*  ALT 24 39 37  ALKPHOS 52 83 74  BILITOT 1.3* 1.5* 0.8  PROT 3.7* 5.9* 5.2*  ALBUMIN 1.8* 3.1* 2.7*    No results for input(s): "LIPASE", "AMYLASE" in the last  168 hours. No results for input(s): "AMMONIA" in the last 168 hours. Coagulation profile Recent Labs  Lab 11/03/21 0537  INR 1.2    COVID-19 Labs  Recent Labs    11/02/21 1648 11/03/21 0537  DDIMER 2.21* 1.40*  FERRITIN 228 256  LDH 216*  --   CRP 4.8* 6.1*     Lab Results  Component Value Date   SARSCOV2NAA POSITIVE (A) 11/02/2021   [place] NEGATIVE 06/06/2021    CBC: Recent Labs  Lab 11/02/21 1640 11/03/21 0537 11/03/21 1737 11/04/21 0548 11/05/21 0450  WBC 7.0 3.8* 12.0* 9.7 9.0  NEUTROABS 4.7 2.9  --   --   --   HGB 12.3* 12.7* 12.1* 11.4* 11.1*  HCT 35.9* 37.0* 35.6* 33.3* 32.7*  MCV 93.5 92.7 92.0 93.0 94.0  PLT 177 163 181 157 162    Cardiac Enzymes: Recent Labs  Lab 11/02/21 1855 11/03/21 0537  CKTOTAL 495* 382    BNP (last 3 results) No results for input(s): "PROBNP" in the  last 8760 hours. CBG: No results for input(s): "GLUCAP" in the last 168 hours. D-Dimer: Recent Labs    11/02/21 1648 11/03/21 0537  DDIMER 2.21* 1.40*    Hgb A1c: No results for input(s): "HGBA1C" in the last 72 hours. Lipid Profile: No results for input(s): "CHOL", "HDL", "LDLCALC", "TRIG", "CHOLHDL", "LDLDIRECT" in the last 72 hours. Thyroid function studies: Recent Labs    11/02/21 1648  TSH 2.039    Anemia work up: Recent Labs    11/02/21 1648 11/03/21 0537  FERRITIN 228 256    Sepsis Labs: Recent Labs  Lab 11/02/21 1648 11/03/21 0537 11/03/21 1737 11/04/21 0548 11/05/21 0450  PROCALCITON <0.10  --   --   --   --   WBC  --  3.8* 12.0* 9.7 9.0    Microbiology Recent Results (from the past 240 hour(s))  Resp Panel by RT-PCR (Flu A&B, Covid) Anterior Nasal Swab     Status: Abnormal   Collection Time: 11/02/21  7:18 PM   Specimen: Anterior Nasal Swab  Result Value Ref Range Status   SARS Coronavirus 2 by RT PCR POSITIVE (A) NEGATIVE Final    Comment: (NOTE) SARS-CoV-2 target nucleic acids are DETECTED.  The SARS-CoV-2 RNA is  generally detectable in upper respiratory specimens during the acute phase of infection. Positive results are indicative of the presence of the identified virus, but do not rule out bacterial infection or co-infection with other pathogens not detected by the test. Clinical correlation with patient history and other diagnostic information is necessary to determine patient infection status. The expected result is Negative.  Fact Sheet for Patients: EntrepreneurPulse.com.au  Fact Sheet for Healthcare Providers: IncredibleEmployment.be  This test is not yet approved or cleared by the [place] FDA and  has been authorized for detection and/or diagnosis of SARS-CoV-2 by FDA under an Emergency Use Authorization (EUA).  This EUA will remain in effect (meaning this test can be used) for the duration of  the COVID-19 declaration under Section 564(b)(1) of the A ct, 21 U.S.C. section 360bbb-3(b)(1), unless the authorization is terminated or revoked sooner.     Influenza A by PCR NEGATIVE NEGATIVE Final   Influenza B by PCR NEGATIVE NEGATIVE Final    Comment: (NOTE) The Xpert Xpress SARS-CoV-2/FLU/RSV plus assay is intended as an aid in the diagnosis of influenza from Nasopharyngeal swab specimens and should not be used as a sole basis for treatment. Nasal washings and aspirates are unacceptable for Xpert Xpress SARS-CoV-2/FLU/RSV testing.  Fact Sheet for Patients: EntrepreneurPulse.com.au  Fact Sheet for Healthcare Providers: IncredibleEmployment.be  This test is not yet approved or cleared by the [place] FDA and has been authorized for detection and/or diagnosis of SARS-CoV-2 by FDA under an Emergency Use Authorization (EUA). This EUA will remain in effect (meaning this test can be used) for the duration of the COVID-19 declaration under Section 564(b)(1) of the Act, 21 U.S.C. section 360bbb-3(b)(1),  unless the authorization is terminated or revoked.  Performed at [facility], [place] [address], [place], [place] [address]   MRSA Next Gen by PCR, Nasal     Status: None   Collection Time: 11/03/21  1:17 AM   Specimen: Nasal Mucosa; Nasal Swab  Result Value Ref Range Status   MRSA by PCR Next Gen NOT DETECTED NOT DETECTED Final    Comment: (NOTE) The GeneXpert MRSA Assay (FDA approved for NASAL specimens only), is one component of a comprehensive MRSA colonization surveillance program. It is not intended to diagnose  MRSA infection nor to guide or monitor treatment for MRSA infections. Test performance is not FDA approved in patients less than 72 years old. Performed at [facility], [place] [address] [place], [place] [address]      Medications:    atorvastatin  40 mg Oral Daily   enoxaparin (LOVENOX) injection  1 mg/kg Subcutaneous Q12H   feeding supplement  237 mL Oral BID BM   gabapentin  300 mg Oral TID   pantoprazole  40 mg Oral Daily   sodium chloride flush  3 mL Intravenous Q12H   ticagrelor  90 mg Oral BID   Continuous Infusions:  sodium chloride 10 mL/hr at 11/04/21 0934      LOS: 3 days   Charlynne Cousins  [facility]  11/05/2021, 9:36 AM

## 2021-11-06 DIAGNOSIS — J9600 Acute respiratory failure, unspecified whether with hypoxia or hypercapnia: Secondary | ICD-10-CM | POA: Diagnosis not present

## 2021-11-06 DIAGNOSIS — R1311 Dysphagia, oral phase: Secondary | ICD-10-CM | POA: Diagnosis not present

## 2021-11-06 DIAGNOSIS — R1312 Dysphagia, oropharyngeal phase: Secondary | ICD-10-CM | POA: Diagnosis not present

## 2021-11-06 DIAGNOSIS — R419 Unspecified symptoms and signs involving cognitive functions and awareness: Secondary | ICD-10-CM | POA: Diagnosis not present

## 2021-11-06 DIAGNOSIS — K219 Gastro-esophageal reflux disease without esophagitis: Secondary | ICD-10-CM | POA: Diagnosis not present

## 2021-11-06 DIAGNOSIS — U071 COVID-19: Secondary | ICD-10-CM | POA: Diagnosis not present

## 2021-11-09 DIAGNOSIS — R1312 Dysphagia, oropharyngeal phase: Secondary | ICD-10-CM | POA: Diagnosis not present

## 2021-11-09 DIAGNOSIS — K219 Gastro-esophageal reflux disease without esophagitis: Secondary | ICD-10-CM | POA: Diagnosis not present

## 2021-11-09 DIAGNOSIS — R419 Unspecified symptoms and signs involving cognitive functions and awareness: Secondary | ICD-10-CM | POA: Diagnosis not present

## 2021-11-09 DIAGNOSIS — U071 COVID-19: Secondary | ICD-10-CM | POA: Diagnosis not present

## 2021-11-09 DIAGNOSIS — R1311 Dysphagia, oral phase: Secondary | ICD-10-CM | POA: Diagnosis not present

## 2021-11-09 DIAGNOSIS — J9600 Acute respiratory failure, unspecified whether with hypoxia or hypercapnia: Secondary | ICD-10-CM | POA: Diagnosis not present

## 2021-11-10 DIAGNOSIS — R1312 Dysphagia, oropharyngeal phase: Secondary | ICD-10-CM | POA: Diagnosis not present

## 2021-11-10 DIAGNOSIS — R1311 Dysphagia, oral phase: Secondary | ICD-10-CM | POA: Diagnosis not present

## 2021-11-10 DIAGNOSIS — K219 Gastro-esophageal reflux disease without esophagitis: Secondary | ICD-10-CM | POA: Diagnosis not present

## 2021-11-10 DIAGNOSIS — U071 COVID-19: Secondary | ICD-10-CM | POA: Diagnosis not present

## 2021-11-10 DIAGNOSIS — R419 Unspecified symptoms and signs involving cognitive functions and awareness: Secondary | ICD-10-CM | POA: Diagnosis not present

## 2021-11-10 DIAGNOSIS — J9600 Acute respiratory failure, unspecified whether with hypoxia or hypercapnia: Secondary | ICD-10-CM | POA: Diagnosis not present

## 2021-11-11 DIAGNOSIS — R419 Unspecified symptoms and signs involving cognitive functions and awareness: Secondary | ICD-10-CM | POA: Diagnosis not present

## 2021-11-11 DIAGNOSIS — J9600 Acute respiratory failure, unspecified whether with hypoxia or hypercapnia: Secondary | ICD-10-CM | POA: Diagnosis not present

## 2021-11-11 DIAGNOSIS — R1312 Dysphagia, oropharyngeal phase: Secondary | ICD-10-CM | POA: Diagnosis not present

## 2021-11-11 DIAGNOSIS — K219 Gastro-esophageal reflux disease without esophagitis: Secondary | ICD-10-CM | POA: Diagnosis not present

## 2021-11-11 DIAGNOSIS — R1311 Dysphagia, oral phase: Secondary | ICD-10-CM | POA: Diagnosis not present

## 2021-11-11 DIAGNOSIS — U071 COVID-19: Secondary | ICD-10-CM | POA: Diagnosis not present

## 2021-11-12 ENCOUNTER — Other Ambulatory Visit: Payer: Self-pay

## 2021-11-12 DIAGNOSIS — R1311 Dysphagia, oral phase: Secondary | ICD-10-CM | POA: Diagnosis not present

## 2021-11-12 DIAGNOSIS — K219 Gastro-esophageal reflux disease without esophagitis: Secondary | ICD-10-CM | POA: Diagnosis not present

## 2021-11-12 DIAGNOSIS — R419 Unspecified symptoms and signs involving cognitive functions and awareness: Secondary | ICD-10-CM | POA: Diagnosis not present

## 2021-11-12 DIAGNOSIS — R1312 Dysphagia, oropharyngeal phase: Secondary | ICD-10-CM | POA: Diagnosis not present

## 2021-11-12 DIAGNOSIS — J9600 Acute respiratory failure, unspecified whether with hypoxia or hypercapnia: Secondary | ICD-10-CM | POA: Diagnosis not present

## 2021-11-12 DIAGNOSIS — U071 COVID-19: Secondary | ICD-10-CM | POA: Diagnosis not present

## 2021-11-12 MED ORDER — METOPROLOL TARTRATE 25 MG PO TABS: 25.0000 mg | ORAL_TABLET | Freq: Two times a day (BID) | ORAL | 3 refills | Status: DC

## 2021-11-13 ENCOUNTER — Other Ambulatory Visit: Payer: Self-pay

## 2021-11-13 DIAGNOSIS — K219 Gastro-esophageal reflux disease without esophagitis: Secondary | ICD-10-CM | POA: Diagnosis not present

## 2021-11-13 DIAGNOSIS — R1311 Dysphagia, oral phase: Secondary | ICD-10-CM | POA: Diagnosis not present

## 2021-11-13 DIAGNOSIS — J9600 Acute respiratory failure, unspecified whether with hypoxia or hypercapnia: Secondary | ICD-10-CM | POA: Diagnosis not present

## 2021-11-13 DIAGNOSIS — R1312 Dysphagia, oropharyngeal phase: Secondary | ICD-10-CM | POA: Diagnosis not present

## 2021-11-13 DIAGNOSIS — U071 COVID-19: Secondary | ICD-10-CM | POA: Diagnosis not present

## 2021-11-13 DIAGNOSIS — R419 Unspecified symptoms and signs involving cognitive functions and awareness: Secondary | ICD-10-CM | POA: Diagnosis not present

## 2021-11-13 MED ORDER — TICAGRELOR 90 MG PO TABS: 90.0000 mg | ORAL_TABLET | Freq: Two times a day (BID) | ORAL | 3 refills | Status: DC

## 2021-11-13 NOTE — Telephone Encounter (Signed)
Rx sent 

## 2021-11-13 NOTE — Telephone Encounter (Signed)
Yes, call in #[phone] with 3 rf

## 2021-11-15 DIAGNOSIS — R1312 Dysphagia, oropharyngeal phase: Secondary | ICD-10-CM | POA: Diagnosis not present

## 2021-11-15 DIAGNOSIS — R1311 Dysphagia, oral phase: Secondary | ICD-10-CM | POA: Diagnosis not present

## 2021-11-15 DIAGNOSIS — J9600 Acute respiratory failure, unspecified whether with hypoxia or hypercapnia: Secondary | ICD-10-CM | POA: Diagnosis not present

## 2021-11-15 DIAGNOSIS — R419 Unspecified symptoms and signs involving cognitive functions and awareness: Secondary | ICD-10-CM | POA: Diagnosis not present

## 2021-11-15 DIAGNOSIS — U071 COVID-19: Secondary | ICD-10-CM | POA: Diagnosis not present

## 2021-11-15 DIAGNOSIS — K219 Gastro-esophageal reflux disease without esophagitis: Secondary | ICD-10-CM | POA: Diagnosis not present

## 2021-11-16 DIAGNOSIS — U071 COVID-19: Secondary | ICD-10-CM | POA: Diagnosis not present

## 2021-11-16 DIAGNOSIS — R1311 Dysphagia, oral phase: Secondary | ICD-10-CM | POA: Diagnosis not present

## 2021-11-16 DIAGNOSIS — J9600 Acute respiratory failure, unspecified whether with hypoxia or hypercapnia: Secondary | ICD-10-CM | POA: Diagnosis not present

## 2021-11-16 DIAGNOSIS — R1312 Dysphagia, oropharyngeal phase: Secondary | ICD-10-CM | POA: Diagnosis not present

## 2021-11-16 DIAGNOSIS — R419 Unspecified symptoms and signs involving cognitive functions and awareness: Secondary | ICD-10-CM | POA: Diagnosis not present

## 2021-11-16 DIAGNOSIS — K219 Gastro-esophageal reflux disease without esophagitis: Secondary | ICD-10-CM | POA: Diagnosis not present

## 2021-11-17 DIAGNOSIS — C44619 Basal cell carcinoma of skin of left upper limb, including shoulder: Secondary | ICD-10-CM | POA: Diagnosis not present

## 2021-11-17 DIAGNOSIS — R1311 Dysphagia, oral phase: Secondary | ICD-10-CM | POA: Diagnosis not present

## 2021-11-17 DIAGNOSIS — K219 Gastro-esophageal reflux disease without esophagitis: Secondary | ICD-10-CM | POA: Diagnosis not present

## 2021-11-17 DIAGNOSIS — U071 COVID-19: Secondary | ICD-10-CM | POA: Diagnosis not present

## 2021-11-17 DIAGNOSIS — R1312 Dysphagia, oropharyngeal phase: Secondary | ICD-10-CM | POA: Diagnosis not present

## 2021-11-17 DIAGNOSIS — L570 Actinic keratosis: Secondary | ICD-10-CM | POA: Diagnosis not present

## 2021-11-17 DIAGNOSIS — R419 Unspecified symptoms and signs involving cognitive functions and awareness: Secondary | ICD-10-CM | POA: Diagnosis not present

## 2021-11-17 DIAGNOSIS — C4442 Squamous cell carcinoma of skin of scalp and neck: Secondary | ICD-10-CM | POA: Diagnosis not present

## 2021-11-17 DIAGNOSIS — Z85828 Personal history of other malignant neoplasm of skin: Secondary | ICD-10-CM | POA: Diagnosis not present

## 2021-11-17 DIAGNOSIS — J9600 Acute respiratory failure, unspecified whether with hypoxia or hypercapnia: Secondary | ICD-10-CM | POA: Diagnosis not present

## 2021-11-18 DIAGNOSIS — J9600 Acute respiratory failure, unspecified whether with hypoxia or hypercapnia: Secondary | ICD-10-CM | POA: Diagnosis not present

## 2021-11-18 DIAGNOSIS — R1311 Dysphagia, oral phase: Secondary | ICD-10-CM | POA: Diagnosis not present

## 2021-11-18 DIAGNOSIS — R419 Unspecified symptoms and signs involving cognitive functions and awareness: Secondary | ICD-10-CM | POA: Diagnosis not present

## 2021-11-18 DIAGNOSIS — R1312 Dysphagia, oropharyngeal phase: Secondary | ICD-10-CM | POA: Diagnosis not present

## 2021-11-18 DIAGNOSIS — U071 COVID-19: Secondary | ICD-10-CM | POA: Diagnosis not present

## 2021-11-18 DIAGNOSIS — K219 Gastro-esophageal reflux disease without esophagitis: Secondary | ICD-10-CM | POA: Diagnosis not present

## 2021-11-19 DIAGNOSIS — R419 Unspecified symptoms and signs involving cognitive functions and awareness: Secondary | ICD-10-CM | POA: Diagnosis not present

## 2021-11-19 DIAGNOSIS — U071 COVID-19: Secondary | ICD-10-CM | POA: Diagnosis not present

## 2021-11-19 DIAGNOSIS — K219 Gastro-esophageal reflux disease without esophagitis: Secondary | ICD-10-CM | POA: Diagnosis not present

## 2021-11-19 DIAGNOSIS — R1312 Dysphagia, oropharyngeal phase: Secondary | ICD-10-CM | POA: Diagnosis not present

## 2021-11-19 DIAGNOSIS — R1311 Dysphagia, oral phase: Secondary | ICD-10-CM | POA: Diagnosis not present

## 2021-11-19 DIAGNOSIS — J9600 Acute respiratory failure, unspecified whether with hypoxia or hypercapnia: Secondary | ICD-10-CM | POA: Diagnosis not present

## 2021-11-20 DIAGNOSIS — R1312 Dysphagia, oropharyngeal phase: Secondary | ICD-10-CM | POA: Diagnosis not present

## 2021-11-20 DIAGNOSIS — U071 COVID-19: Secondary | ICD-10-CM | POA: Diagnosis not present

## 2021-11-20 DIAGNOSIS — R419 Unspecified symptoms and signs involving cognitive functions and awareness: Secondary | ICD-10-CM | POA: Diagnosis not present

## 2021-11-20 DIAGNOSIS — R1311 Dysphagia, oral phase: Secondary | ICD-10-CM | POA: Diagnosis not present

## 2021-11-20 DIAGNOSIS — K219 Gastro-esophageal reflux disease without esophagitis: Secondary | ICD-10-CM | POA: Diagnosis not present

## 2021-11-20 DIAGNOSIS — J9600 Acute respiratory failure, unspecified whether with hypoxia or hypercapnia: Secondary | ICD-10-CM | POA: Diagnosis not present

## 2021-11-23 DIAGNOSIS — K219 Gastro-esophageal reflux disease without esophagitis: Secondary | ICD-10-CM | POA: Diagnosis not present

## 2021-11-23 DIAGNOSIS — U071 COVID-19: Secondary | ICD-10-CM | POA: Diagnosis not present

## 2021-11-23 DIAGNOSIS — R419 Unspecified symptoms and signs involving cognitive functions and awareness: Secondary | ICD-10-CM | POA: Diagnosis not present

## 2021-11-23 DIAGNOSIS — R1312 Dysphagia, oropharyngeal phase: Secondary | ICD-10-CM | POA: Diagnosis not present

## 2021-11-23 DIAGNOSIS — R1311 Dysphagia, oral phase: Secondary | ICD-10-CM | POA: Diagnosis not present

## 2021-11-23 DIAGNOSIS — J9600 Acute respiratory failure, unspecified whether with hypoxia or hypercapnia: Secondary | ICD-10-CM | POA: Diagnosis not present

## 2021-11-25 DIAGNOSIS — R262 Difficulty in walking, not elsewhere classified: Secondary | ICD-10-CM | POA: Diagnosis not present

## 2021-11-25 DIAGNOSIS — U071 COVID-19: Secondary | ICD-10-CM | POA: Diagnosis not present

## 2021-11-25 DIAGNOSIS — R1311 Dysphagia, oral phase: Secondary | ICD-10-CM | POA: Diagnosis not present

## 2021-11-25 DIAGNOSIS — R1312 Dysphagia, oropharyngeal phase: Secondary | ICD-10-CM | POA: Diagnosis not present

## 2021-11-25 DIAGNOSIS — J9600 Acute respiratory failure, unspecified whether with hypoxia or hypercapnia: Secondary | ICD-10-CM | POA: Diagnosis not present

## 2021-11-25 DIAGNOSIS — M6281 Muscle weakness (generalized): Secondary | ICD-10-CM | POA: Diagnosis not present

## 2021-11-25 DIAGNOSIS — R29898 Other symptoms and signs involving the musculoskeletal system: Secondary | ICD-10-CM | POA: Diagnosis not present

## 2021-11-25 DIAGNOSIS — K219 Gastro-esophageal reflux disease without esophagitis: Secondary | ICD-10-CM | POA: Diagnosis not present

## 2021-11-25 DIAGNOSIS — Z9181 History of falling: Secondary | ICD-10-CM | POA: Diagnosis not present

## 2021-11-25 DIAGNOSIS — R419 Unspecified symptoms and signs involving cognitive functions and awareness: Secondary | ICD-10-CM | POA: Diagnosis not present

## 2021-11-26 DIAGNOSIS — R1311 Dysphagia, oral phase: Secondary | ICD-10-CM | POA: Diagnosis not present

## 2021-11-26 DIAGNOSIS — K219 Gastro-esophageal reflux disease without esophagitis: Secondary | ICD-10-CM | POA: Diagnosis not present

## 2021-11-26 DIAGNOSIS — J9600 Acute respiratory failure, unspecified whether with hypoxia or hypercapnia: Secondary | ICD-10-CM | POA: Diagnosis not present

## 2021-11-26 DIAGNOSIS — R1312 Dysphagia, oropharyngeal phase: Secondary | ICD-10-CM | POA: Diagnosis not present

## 2021-11-26 DIAGNOSIS — U071 COVID-19: Secondary | ICD-10-CM | POA: Diagnosis not present

## 2021-11-26 DIAGNOSIS — R419 Unspecified symptoms and signs involving cognitive functions and awareness: Secondary | ICD-10-CM | POA: Diagnosis not present

## 2021-11-27 DIAGNOSIS — J9600 Acute respiratory failure, unspecified whether with hypoxia or hypercapnia: Secondary | ICD-10-CM | POA: Diagnosis not present

## 2021-11-27 DIAGNOSIS — U071 COVID-19: Secondary | ICD-10-CM | POA: Diagnosis not present

## 2021-11-27 DIAGNOSIS — R1312 Dysphagia, oropharyngeal phase: Secondary | ICD-10-CM | POA: Diagnosis not present

## 2021-11-27 DIAGNOSIS — K219 Gastro-esophageal reflux disease without esophagitis: Secondary | ICD-10-CM | POA: Diagnosis not present

## 2021-11-27 DIAGNOSIS — R1311 Dysphagia, oral phase: Secondary | ICD-10-CM | POA: Diagnosis not present

## 2021-11-27 DIAGNOSIS — R419 Unspecified symptoms and signs involving cognitive functions and awareness: Secondary | ICD-10-CM | POA: Diagnosis not present

## 2021-11-30 DIAGNOSIS — R1311 Dysphagia, oral phase: Secondary | ICD-10-CM | POA: Diagnosis not present

## 2021-11-30 DIAGNOSIS — K219 Gastro-esophageal reflux disease without esophagitis: Secondary | ICD-10-CM | POA: Diagnosis not present

## 2021-11-30 DIAGNOSIS — R1312 Dysphagia, oropharyngeal phase: Secondary | ICD-10-CM | POA: Diagnosis not present

## 2021-11-30 DIAGNOSIS — J9600 Acute respiratory failure, unspecified whether with hypoxia or hypercapnia: Secondary | ICD-10-CM | POA: Diagnosis not present

## 2021-11-30 DIAGNOSIS — U071 COVID-19: Secondary | ICD-10-CM | POA: Diagnosis not present

## 2021-11-30 DIAGNOSIS — R419 Unspecified symptoms and signs involving cognitive functions and awareness: Secondary | ICD-10-CM | POA: Diagnosis not present

## 2021-12-01 DIAGNOSIS — K219 Gastro-esophageal reflux disease without esophagitis: Secondary | ICD-10-CM | POA: Diagnosis not present

## 2021-12-01 DIAGNOSIS — R419 Unspecified symptoms and signs involving cognitive functions and awareness: Secondary | ICD-10-CM | POA: Diagnosis not present

## 2021-12-01 DIAGNOSIS — U071 COVID-19: Secondary | ICD-10-CM | POA: Diagnosis not present

## 2021-12-01 DIAGNOSIS — R1311 Dysphagia, oral phase: Secondary | ICD-10-CM | POA: Diagnosis not present

## 2021-12-01 DIAGNOSIS — R1312 Dysphagia, oropharyngeal phase: Secondary | ICD-10-CM | POA: Diagnosis not present

## 2021-12-01 DIAGNOSIS — J9600 Acute respiratory failure, unspecified whether with hypoxia or hypercapnia: Secondary | ICD-10-CM | POA: Diagnosis not present

## 2021-12-02 DIAGNOSIS — R419 Unspecified symptoms and signs involving cognitive functions and awareness: Secondary | ICD-10-CM | POA: Diagnosis not present

## 2021-12-02 DIAGNOSIS — U071 COVID-19: Secondary | ICD-10-CM | POA: Diagnosis not present

## 2021-12-02 DIAGNOSIS — R1312 Dysphagia, oropharyngeal phase: Secondary | ICD-10-CM | POA: Diagnosis not present

## 2021-12-02 DIAGNOSIS — R1311 Dysphagia, oral phase: Secondary | ICD-10-CM | POA: Diagnosis not present

## 2021-12-02 DIAGNOSIS — J9600 Acute respiratory failure, unspecified whether with hypoxia or hypercapnia: Secondary | ICD-10-CM | POA: Diagnosis not present

## 2021-12-02 DIAGNOSIS — K219 Gastro-esophageal reflux disease without esophagitis: Secondary | ICD-10-CM | POA: Diagnosis not present

## 2021-12-03 DIAGNOSIS — J9600 Acute respiratory failure, unspecified whether with hypoxia or hypercapnia: Secondary | ICD-10-CM | POA: Diagnosis not present

## 2021-12-03 DIAGNOSIS — U071 COVID-19: Secondary | ICD-10-CM | POA: Diagnosis not present

## 2021-12-03 DIAGNOSIS — R1311 Dysphagia, oral phase: Secondary | ICD-10-CM | POA: Diagnosis not present

## 2021-12-03 DIAGNOSIS — R419 Unspecified symptoms and signs involving cognitive functions and awareness: Secondary | ICD-10-CM | POA: Diagnosis not present

## 2021-12-03 DIAGNOSIS — K219 Gastro-esophageal reflux disease without esophagitis: Secondary | ICD-10-CM | POA: Diagnosis not present

## 2021-12-03 DIAGNOSIS — R1312 Dysphagia, oropharyngeal phase: Secondary | ICD-10-CM | POA: Diagnosis not present

## 2021-12-04 DIAGNOSIS — R1312 Dysphagia, oropharyngeal phase: Secondary | ICD-10-CM | POA: Diagnosis not present

## 2021-12-04 DIAGNOSIS — K219 Gastro-esophageal reflux disease without esophagitis: Secondary | ICD-10-CM | POA: Diagnosis not present

## 2021-12-04 DIAGNOSIS — R419 Unspecified symptoms and signs involving cognitive functions and awareness: Secondary | ICD-10-CM | POA: Diagnosis not present

## 2021-12-04 DIAGNOSIS — R1311 Dysphagia, oral phase: Secondary | ICD-10-CM | POA: Diagnosis not present

## 2021-12-04 DIAGNOSIS — U071 COVID-19: Secondary | ICD-10-CM | POA: Diagnosis not present

## 2021-12-04 DIAGNOSIS — J9600 Acute respiratory failure, unspecified whether with hypoxia or hypercapnia: Secondary | ICD-10-CM | POA: Diagnosis not present

## 2021-12-07 ENCOUNTER — Ambulatory Visit: Payer: Medicare Other | Admitting: Cardiovascular Disease

## 2021-12-07 DIAGNOSIS — K219 Gastro-esophageal reflux disease without esophagitis: Secondary | ICD-10-CM | POA: Diagnosis not present

## 2021-12-07 DIAGNOSIS — R419 Unspecified symptoms and signs involving cognitive functions and awareness: Secondary | ICD-10-CM | POA: Diagnosis not present

## 2021-12-07 DIAGNOSIS — J9600 Acute respiratory failure, unspecified whether with hypoxia or hypercapnia: Secondary | ICD-10-CM | POA: Diagnosis not present

## 2021-12-07 DIAGNOSIS — U071 COVID-19: Secondary | ICD-10-CM | POA: Diagnosis not present

## 2021-12-07 DIAGNOSIS — R1312 Dysphagia, oropharyngeal phase: Secondary | ICD-10-CM | POA: Diagnosis not present

## 2021-12-07 DIAGNOSIS — R1311 Dysphagia, oral phase: Secondary | ICD-10-CM | POA: Diagnosis not present

## 2021-12-08 ENCOUNTER — Telehealth (HOSPITAL_COMMUNITY): Payer: Self-pay

## 2021-12-08 DIAGNOSIS — H0102B Squamous blepharitis left eye, upper and lower eyelids: Secondary | ICD-10-CM | POA: Diagnosis not present

## 2021-12-08 DIAGNOSIS — H0102A Squamous blepharitis right eye, upper and lower eyelids: Secondary | ICD-10-CM | POA: Diagnosis not present

## 2021-12-08 DIAGNOSIS — H02831 Dermatochalasis of right upper eyelid: Secondary | ICD-10-CM | POA: Diagnosis not present

## 2021-12-08 DIAGNOSIS — D3131 Benign neoplasm of right choroid: Secondary | ICD-10-CM | POA: Diagnosis not present

## 2021-12-08 DIAGNOSIS — H40013 Open angle with borderline findings, low risk, bilateral: Secondary | ICD-10-CM | POA: Diagnosis not present

## 2021-12-08 DIAGNOSIS — H02834 Dermatochalasis of left upper eyelid: Secondary | ICD-10-CM | POA: Diagnosis not present

## 2021-12-08 DIAGNOSIS — Z961 Presence of intraocular lens: Secondary | ICD-10-CM | POA: Diagnosis not present

## 2021-12-08 DIAGNOSIS — H353131 Nonexudative age-related macular degeneration, bilateral, early dry stage: Secondary | ICD-10-CM | POA: Diagnosis not present

## 2021-12-08 DIAGNOSIS — H04123 Dry eye syndrome of bilateral lacrimal glands: Secondary | ICD-10-CM | POA: Diagnosis not present

## 2021-12-08 NOTE — Telephone Encounter (Signed)
Transitions of Care Pharmacy  ° °Call attempted for a pharmacy transitions of care follow-up. HIPAA appropriate voicemail was left with call back information provided.  ° °Call attempt #1. Will follow-up in 2-3 days.  °  °

## 2021-12-09 DIAGNOSIS — J9600 Acute respiratory failure, unspecified whether with hypoxia or hypercapnia: Secondary | ICD-10-CM | POA: Diagnosis not present

## 2021-12-09 DIAGNOSIS — U071 COVID-19: Secondary | ICD-10-CM | POA: Diagnosis not present

## 2021-12-09 DIAGNOSIS — K219 Gastro-esophageal reflux disease without esophagitis: Secondary | ICD-10-CM | POA: Diagnosis not present

## 2021-12-09 DIAGNOSIS — R1312 Dysphagia, oropharyngeal phase: Secondary | ICD-10-CM | POA: Diagnosis not present

## 2021-12-09 DIAGNOSIS — R419 Unspecified symptoms and signs involving cognitive functions and awareness: Secondary | ICD-10-CM | POA: Diagnosis not present

## 2021-12-09 DIAGNOSIS — R1311 Dysphagia, oral phase: Secondary | ICD-10-CM | POA: Diagnosis not present

## 2021-12-10 ENCOUNTER — Telehealth (HOSPITAL_COMMUNITY): Payer: Self-pay

## 2021-12-10 NOTE — Telephone Encounter (Signed)
Pharmacy Transitions of Care Follow-up Telephone Call  Date of discharge: 11/05/2021  Discharge Diagnosis: Atrial Fibrillation   How have you been since you were released from the hospital? Patient has been doing well since discharge. He has no questions or concerns regarding his medications. He currently resides at [facility] in [place], and they take care of all of his medications.    Medication changes made at discharge: START taking: Eliquis (apixaban)   STOP taking: aspirin 81 MG tablet   Medication changes verified by the patient? Yes

## 2021-12-11 DIAGNOSIS — K219 Gastro-esophageal reflux disease without esophagitis: Secondary | ICD-10-CM | POA: Diagnosis not present

## 2021-12-11 DIAGNOSIS — J9600 Acute respiratory failure, unspecified whether with hypoxia or hypercapnia: Secondary | ICD-10-CM | POA: Diagnosis not present

## 2021-12-11 DIAGNOSIS — U071 COVID-19: Secondary | ICD-10-CM | POA: Diagnosis not present

## 2021-12-11 DIAGNOSIS — R419 Unspecified symptoms and signs involving cognitive functions and awareness: Secondary | ICD-10-CM | POA: Diagnosis not present

## 2021-12-11 DIAGNOSIS — R1311 Dysphagia, oral phase: Secondary | ICD-10-CM | POA: Diagnosis not present

## 2021-12-11 DIAGNOSIS — R1312 Dysphagia, oropharyngeal phase: Secondary | ICD-10-CM | POA: Diagnosis not present

## 2021-12-12 DIAGNOSIS — R1311 Dysphagia, oral phase: Secondary | ICD-10-CM | POA: Diagnosis not present

## 2021-12-12 DIAGNOSIS — R419 Unspecified symptoms and signs involving cognitive functions and awareness: Secondary | ICD-10-CM | POA: Diagnosis not present

## 2021-12-12 DIAGNOSIS — K219 Gastro-esophageal reflux disease without esophagitis: Secondary | ICD-10-CM | POA: Diagnosis not present

## 2021-12-12 DIAGNOSIS — U071 COVID-19: Secondary | ICD-10-CM | POA: Diagnosis not present

## 2021-12-12 DIAGNOSIS — R1312 Dysphagia, oropharyngeal phase: Secondary | ICD-10-CM | POA: Diagnosis not present

## 2021-12-12 DIAGNOSIS — J9600 Acute respiratory failure, unspecified whether with hypoxia or hypercapnia: Secondary | ICD-10-CM | POA: Diagnosis not present

## 2021-12-14 DIAGNOSIS — R1311 Dysphagia, oral phase: Secondary | ICD-10-CM | POA: Diagnosis not present

## 2021-12-14 DIAGNOSIS — J9600 Acute respiratory failure, unspecified whether with hypoxia or hypercapnia: Secondary | ICD-10-CM | POA: Diagnosis not present

## 2021-12-14 DIAGNOSIS — U071 COVID-19: Secondary | ICD-10-CM | POA: Diagnosis not present

## 2021-12-14 DIAGNOSIS — R1312 Dysphagia, oropharyngeal phase: Secondary | ICD-10-CM | POA: Diagnosis not present

## 2021-12-14 DIAGNOSIS — R419 Unspecified symptoms and signs involving cognitive functions and awareness: Secondary | ICD-10-CM | POA: Diagnosis not present

## 2021-12-14 DIAGNOSIS — K219 Gastro-esophageal reflux disease without esophagitis: Secondary | ICD-10-CM | POA: Diagnosis not present

## 2021-12-15 DIAGNOSIS — R1311 Dysphagia, oral phase: Secondary | ICD-10-CM | POA: Diagnosis not present

## 2021-12-15 DIAGNOSIS — K219 Gastro-esophageal reflux disease without esophagitis: Secondary | ICD-10-CM | POA: Diagnosis not present

## 2021-12-15 DIAGNOSIS — R1312 Dysphagia, oropharyngeal phase: Secondary | ICD-10-CM | POA: Diagnosis not present

## 2021-12-15 DIAGNOSIS — R419 Unspecified symptoms and signs involving cognitive functions and awareness: Secondary | ICD-10-CM | POA: Diagnosis not present

## 2021-12-15 DIAGNOSIS — U071 COVID-19: Secondary | ICD-10-CM | POA: Diagnosis not present

## 2021-12-15 DIAGNOSIS — J9600 Acute respiratory failure, unspecified whether with hypoxia or hypercapnia: Secondary | ICD-10-CM | POA: Diagnosis not present

## 2021-12-16 DIAGNOSIS — R1312 Dysphagia, oropharyngeal phase: Secondary | ICD-10-CM | POA: Diagnosis not present

## 2021-12-16 DIAGNOSIS — R1311 Dysphagia, oral phase: Secondary | ICD-10-CM | POA: Diagnosis not present

## 2021-12-16 DIAGNOSIS — K219 Gastro-esophageal reflux disease without esophagitis: Secondary | ICD-10-CM | POA: Diagnosis not present

## 2021-12-16 DIAGNOSIS — R419 Unspecified symptoms and signs involving cognitive functions and awareness: Secondary | ICD-10-CM | POA: Diagnosis not present

## 2021-12-16 DIAGNOSIS — J9600 Acute respiratory failure, unspecified whether with hypoxia or hypercapnia: Secondary | ICD-10-CM | POA: Diagnosis not present

## 2021-12-16 DIAGNOSIS — U071 COVID-19: Secondary | ICD-10-CM | POA: Diagnosis not present

## 2021-12-17 DIAGNOSIS — U071 COVID-19: Secondary | ICD-10-CM | POA: Diagnosis not present

## 2021-12-17 DIAGNOSIS — K219 Gastro-esophageal reflux disease without esophagitis: Secondary | ICD-10-CM | POA: Diagnosis not present

## 2021-12-17 DIAGNOSIS — R1311 Dysphagia, oral phase: Secondary | ICD-10-CM | POA: Diagnosis not present

## 2021-12-17 DIAGNOSIS — R419 Unspecified symptoms and signs involving cognitive functions and awareness: Secondary | ICD-10-CM | POA: Diagnosis not present

## 2021-12-17 DIAGNOSIS — J9600 Acute respiratory failure, unspecified whether with hypoxia or hypercapnia: Secondary | ICD-10-CM | POA: Diagnosis not present

## 2021-12-17 DIAGNOSIS — R1312 Dysphagia, oropharyngeal phase: Secondary | ICD-10-CM | POA: Diagnosis not present

## 2021-12-18 DIAGNOSIS — R1312 Dysphagia, oropharyngeal phase: Secondary | ICD-10-CM | POA: Diagnosis not present

## 2021-12-18 DIAGNOSIS — R1311 Dysphagia, oral phase: Secondary | ICD-10-CM | POA: Diagnosis not present

## 2021-12-18 DIAGNOSIS — R419 Unspecified symptoms and signs involving cognitive functions and awareness: Secondary | ICD-10-CM | POA: Diagnosis not present

## 2021-12-18 DIAGNOSIS — K219 Gastro-esophageal reflux disease without esophagitis: Secondary | ICD-10-CM | POA: Diagnosis not present

## 2021-12-18 DIAGNOSIS — J9600 Acute respiratory failure, unspecified whether with hypoxia or hypercapnia: Secondary | ICD-10-CM | POA: Diagnosis not present

## 2021-12-18 DIAGNOSIS — U071 COVID-19: Secondary | ICD-10-CM | POA: Diagnosis not present

## 2021-12-21 ENCOUNTER — Ambulatory Visit: Payer: Medicare Other | Admitting: Cardiovascular Disease

## 2021-12-21 DIAGNOSIS — R1311 Dysphagia, oral phase: Secondary | ICD-10-CM | POA: Diagnosis not present

## 2021-12-21 DIAGNOSIS — K219 Gastro-esophageal reflux disease without esophagitis: Secondary | ICD-10-CM | POA: Diagnosis not present

## 2021-12-21 DIAGNOSIS — J9600 Acute respiratory failure, unspecified whether with hypoxia or hypercapnia: Secondary | ICD-10-CM | POA: Diagnosis not present

## 2021-12-21 DIAGNOSIS — R1312 Dysphagia, oropharyngeal phase: Secondary | ICD-10-CM | POA: Diagnosis not present

## 2021-12-21 DIAGNOSIS — U071 COVID-19: Secondary | ICD-10-CM | POA: Diagnosis not present

## 2021-12-21 DIAGNOSIS — R419 Unspecified symptoms and signs involving cognitive functions and awareness: Secondary | ICD-10-CM | POA: Diagnosis not present

## 2021-12-22 DIAGNOSIS — K219 Gastro-esophageal reflux disease without esophagitis: Secondary | ICD-10-CM | POA: Diagnosis not present

## 2021-12-22 DIAGNOSIS — R1311 Dysphagia, oral phase: Secondary | ICD-10-CM | POA: Diagnosis not present

## 2021-12-22 DIAGNOSIS — Z85828 Personal history of other malignant neoplasm of skin: Secondary | ICD-10-CM | POA: Diagnosis not present

## 2021-12-22 DIAGNOSIS — U071 COVID-19: Secondary | ICD-10-CM | POA: Diagnosis not present

## 2021-12-22 DIAGNOSIS — L579 Skin changes due to chronic exposure to nonionizing radiation, unspecified: Secondary | ICD-10-CM | POA: Diagnosis not present

## 2021-12-22 DIAGNOSIS — J9600 Acute respiratory failure, unspecified whether with hypoxia or hypercapnia: Secondary | ICD-10-CM | POA: Diagnosis not present

## 2021-12-22 DIAGNOSIS — C4442 Squamous cell carcinoma of skin of scalp and neck: Secondary | ICD-10-CM | POA: Diagnosis not present

## 2021-12-22 DIAGNOSIS — C44619 Basal cell carcinoma of skin of left upper limb, including shoulder: Secondary | ICD-10-CM | POA: Diagnosis not present

## 2021-12-22 DIAGNOSIS — R419 Unspecified symptoms and signs involving cognitive functions and awareness: Secondary | ICD-10-CM | POA: Diagnosis not present

## 2021-12-22 DIAGNOSIS — R1312 Dysphagia, oropharyngeal phase: Secondary | ICD-10-CM | POA: Diagnosis not present

## 2021-12-22 DIAGNOSIS — L570 Actinic keratosis: Secondary | ICD-10-CM | POA: Diagnosis not present

## 2021-12-23 ENCOUNTER — Telehealth: Payer: Self-pay | Admitting: Family Medicine

## 2021-12-23 ENCOUNTER — Encounter: Payer: Self-pay | Admitting: Cardiovascular Disease

## 2021-12-23 ENCOUNTER — Ambulatory Visit: Payer: Medicare Other | Attending: Cardiovascular Disease | Admitting: Cardiovascular Disease

## 2021-12-23 DIAGNOSIS — I2102 ST elevation (STEMI) myocardial infarction involving left anterior descending coronary artery: Secondary | ICD-10-CM | POA: Diagnosis not present

## 2021-12-23 DIAGNOSIS — I480 Paroxysmal atrial fibrillation: Secondary | ICD-10-CM

## 2021-12-23 DIAGNOSIS — J9600 Acute respiratory failure, unspecified whether with hypoxia or hypercapnia: Secondary | ICD-10-CM | POA: Diagnosis not present

## 2021-12-23 DIAGNOSIS — I10 Essential (primary) hypertension: Secondary | ICD-10-CM | POA: Diagnosis not present

## 2021-12-23 DIAGNOSIS — R1312 Dysphagia, oropharyngeal phase: Secondary | ICD-10-CM | POA: Diagnosis not present

## 2021-12-23 DIAGNOSIS — Z79899 Other long term (current) drug therapy: Secondary | ICD-10-CM

## 2021-12-23 DIAGNOSIS — U071 COVID-19: Secondary | ICD-10-CM | POA: Diagnosis not present

## 2021-12-23 DIAGNOSIS — R1311 Dysphagia, oral phase: Secondary | ICD-10-CM | POA: Diagnosis not present

## 2021-12-23 DIAGNOSIS — E785 Hyperlipidemia, unspecified: Secondary | ICD-10-CM

## 2021-12-23 DIAGNOSIS — K219 Gastro-esophageal reflux disease without esophagitis: Secondary | ICD-10-CM | POA: Diagnosis not present

## 2021-12-23 DIAGNOSIS — I2699 Other pulmonary embolism without acute cor pulmonale: Secondary | ICD-10-CM

## 2021-12-23 DIAGNOSIS — I255 Ischemic cardiomyopathy: Secondary | ICD-10-CM | POA: Diagnosis not present

## 2021-12-23 DIAGNOSIS — I358 Other nonrheumatic aortic valve disorders: Secondary | ICD-10-CM

## 2021-12-23 DIAGNOSIS — I2510 Atherosclerotic heart disease of native coronary artery without angina pectoris: Secondary | ICD-10-CM

## 2021-12-23 DIAGNOSIS — R419 Unspecified symptoms and signs involving cognitive functions and awareness: Secondary | ICD-10-CM | POA: Diagnosis not present

## 2021-12-23 DIAGNOSIS — Z9861 Coronary angioplasty status: Secondary | ICD-10-CM | POA: Diagnosis not present

## 2021-12-23 MED ORDER — CLOPIDOGREL BISULFATE 75 MG PO TABS: 75.0000 mg | ORAL_TABLET | Freq: Every day | ORAL | 3 refills | Status: DC

## 2021-12-23 MED ORDER — METOPROLOL TARTRATE 25 MG PO TABS
ORAL_TABLET | ORAL | 3 refills | Status: DC
Start: 2021-12-23 — End: 2022-11-10

## 2021-12-23 NOTE — Patient Instructions (Signed)
Medication Instructions:  STOP Brilinta (do not take dose tonight)   START Plavix (08/31) 150 mg, then decrease to 75 mg daily thereafter (starting 09/01)  Decrease Metoprolol to 25 mg AM, and 12.5 mg PM.   *If you need a refill on your cardiac medications before your next appointment, please call your pharmacy*   Follow-Up: At [facility], you and your health needs are our priority.  As part of our continuing mission to provide you with exceptional heart care, we have created designated Provider Care Teams.  These Care Teams include your primary Cardiologist (physician) and Advanced Practice Providers (APPs -  Physician Assistants and Nurse Practitioners) who all work together to provide you with the care you need, when you need it.  We recommend signing up for the patient portal called "MyChart".  Sign up information is provided on this After Visit Summary.  MyChart is used to connect with patients for Virtual Visits (Telemedicine).  Patients are able to view lab/test results, encounter notes, upcoming appointments, etc.  Non-urgent messages can be sent to your provider as well.   To learn more about what you can do with MyChart, go to [email or web address].    Your next appointment:   3-4 month(s)  The format for your next appointment:   In Person  Provider:   Shelva Majestic, MD

## 2021-12-23 NOTE — Progress Notes (Unsigned)
     Cardiology Office Note    Date:  12/24/2021   ID:  Gabriel [name], DOB 04/02/1925, MRN [id number]  PCP:  Fry, Stephen A, MD  Cardiologist:  Thomas Kelly, MD   Initial office visit with me following his February 2023 hospitalization.   History of Present Illness:  Gabriel [name] is a 88 y.o. male who presents for his initial office evaluation with me following his February 2023 hospitalization.  Gabriel [name] has a history of hypertension, GERD, who was hospitalized in February 2023 in the setting of ST segment elevation myocardial infarction.  He was found to have thrombotic 90% mid LAD stenosis with 99% subtotal occlusion of the mid distal LAD with otherwise mild nonobstructive CAD in the circumflex and with ectasia without significant stenosis in the RCA.  He underwent emergent catheterization by Dr. And and underwent successful DES stenting with nonoverlapping 3.0 x 12 mm and 2.0 x 15 mm Medtronic Onyx stents.  He was started on DAPT PT with aspirin and Brilinta.  An echo Doppler study showed EF 40 to 45% with apical anterior wall hypokinesis.  An initial ECG raises concern for possible A-fib but repeat ECG shows sinus bradycardia with occasional junctional beats.  DOAC was not started and he ultimately stabilized and was discharged on aspirin/Brilinta.  [facility].  Following his hospitalization, he apparently had fallen out of bed on several occasions and ultimately was transition to assisted living.  He has seen Emily Monge, NP on 2 occasions following his hospitalization and last saw her on Sep 02, 2021.  At that time he was stable I continue to be on aspirin/Brilinta, metoprolol tartrate 25 mg twice a day, atorvastatin 40 mg daily, in addition to gabapentin and pantoprazole.  Apparently, he was hospitalized on November 02, 2021 after development of COVID with COVID-pneumonia.  CT angio of his chest and was done which revealed  lobar and segmental pulmonary emboli in branches of the right upper lobe with small overall clot burden. There was CT evidence for right heart strain.  There was mild subpleural patchy opacities in the lungs bilaterally, lower lobe predominant felt related to his COVID-pneumonia.  During that hospitalization, he was started on Eliquis and advised to stop taking aspirin but to continue ticagrelor.  An echo Doppler study on November 02, 2021 showed hyperdynamic LV function with EF at 65 to 70% without wall motion abnormalities.  There was grade 2 diastolic dysfunction.  He had mildly elevated PA systolic pressure 39.6 mm.  Left atrium was mildly dilated.  There was mild mitral regurgitation.  There was mild aortic sclerosis.  Presently, he is here with his daughter.  [facility] has [facility].  He walks with a walker and also rides a scooter.  He has been participating in physical therapy 3 days/week.  His primary provider is Dr. Stephen Fry.  He denies any chest tightness or awareness of palpitations.  He denies presyncope or syncope.  He presents for his initial office evaluation with me.   Past Medical History:  Diagnosis Date   Arthritis    Diverticulosis of colon    External thrombosed hemorrhoids 06/11/2009   Qualifier: Diagnosis of  By: Fry MD, Stephen A    GERD (gastroesophageal reflux disease)    Hypertension     Past Surgical History:  Procedure Laterality Date   cataract extaction, right  6/09     per Dr. Grant   CATARACT EXTRACTION Left    COLONOSCOPY  09/23/04   per Dr. Weissman, clear, no repeats needed    CORONARY STENT INTERVENTION N/A 06/06/2021   Procedure: CORONARY STENT INTERVENTION;  Surgeon: End, Christopher, MD;  Location: [facility];  Service: Cardiovascular;  Laterality: N/A;   CORONARY/GRAFT ACUTE MI REVASCULARIZATION N/A 06/06/2021   Procedure: Coronary/Graft Acute MI Revascularization;  Surgeon: End, Christopher, MD;  Location: [facility]  [facility];  Service: Cardiovascular;  Laterality: N/A;   EGD and esophageal diatation,  05-10-11   per Dr. Jacobs     HERNIA REPAIR     LEFT HEART CATH AND CORONARY ANGIOGRAPHY N/A 06/06/2021   Procedure: LEFT HEART CATH AND CORONARY ANGIOGRAPHY;  Surgeon: End, Christopher, MD;  Location: [facility] CV [facility];  Service: Cardiovascular;  Laterality: N/A;   rt inguinal hernia     x2    Current Medications: Outpatient Medications Prior to Visit  Medication Sig Dispense Refill   acetaminophen (TYLENOL) 325 MG tablet Take 650 mg by mouth every 6 (six) hours as needed for mild pain or headache.     apixaban (ELIQUIS) 5 MG TABS tablet Take 1 tablet (5 mg total) by mouth 2 (two) times daily. 60 tablet 0   atorvastatin (LIPITOR) 40 MG tablet Take 1 tablet (40 mg total) by mouth daily. 90 tablet 3   gabapentin (NEURONTIN) 300 MG capsule TAKE 1 CAPSULE BY MOUTH THREE TIMES A DAY (Patient taking differently: Take 300 mg by mouth 3 (three) times daily.) 270 capsule 1   nitroGLYCERIN (NITROSTAT) 0.4 MG SL tablet Place 1 tablet (0.4 mg total) under the tongue every 5 (five) minutes as needed for chest pain. 25 tablet 3   pantoprazole (PROTONIX) 40 MG tablet Take 1 tablet (40 mg total) by mouth daily. 30 tablet 0   potassium chloride (KLOR-CON 10) 10 MEQ tablet Take 1 tablet (10 mEq total) by mouth daily. 30 tablet 0   vitamin C (ASCORBIC ACID) 500 MG tablet Take 500 mg by mouth daily. Reported on 10/27/2015     metoprolol tartrate (LOPRESSOR) 25 MG tablet Take 1 tablet (25 mg total) by mouth 2 (two) times daily. 180 tablet 3   ticagrelor (BRILINTA) 90 MG TABS tablet Take 1 tablet (90 mg total) by mouth 2 (two) times daily. 180 tablet 3   No facility-administered medications prior to visit.     Allergies:   Contrast media [iodinated contrast media]   Social History   Socioeconomic History   Marital status: Widowed    Spouse name: Not on file   Number of children: 3   Years of education: Not on file    Highest education level: Not on file  Occupational History   Occupation: retired  Tobacco Use   Smoking status: Former    Packs/day: 15.00    Years: 0.50    Total pack years: 7.50    Types: Cigarettes   Smokeless tobacco: Never   Tobacco comments:    smoked 18 and stopped and states he stopped 30's   Vaping Use   Vaping Use: Never used  Substance and Sexual Activity   Alcohol use: No    Alcohol/week: 0.0 standard drinks of alcohol   Drug use: No   Sexual activity: Never    Birth control/protection: Abstinence  Other Topics Concern   Not on file  Social History Narrative   Lives in studio at [facility]   Widowed since 2009, was married x 57 years.      3 daughters.    Social Determinants of Health   Financial Resource Strain: Low Risk  (06/22/2021)   Overall Financial Resource Strain (CARDIA)    Difficulty of Paying Living Expenses: Not hard at all  Food Insecurity: No Food Insecurity (06/22/2021)   Hunger Vital Sign    Worried About Running Out of Food in the Last Year: Never true    Ran Out of Food in the Last Year: Never true  Transportation Needs: No Transportation Needs (06/22/2021)   PRAPARE - Transportation    Lack of Transportation (Medical): No    Lack of Transportation (Non-Medical): No  Physical Activity: Insufficiently Active (06/22/2021)   Exercise Vital Sign    Days of Exercise per Week: 3 days    Minutes of Exercise per Session: 30 min  Stress: No Stress Concern Present (06/22/2021)   [employer] - Occupational Stress Questionnaire    Feeling of Stress : Only a little  Social Connections: Moderately Integrated (06/22/2021)   Social Connection and Isolation Panel [NHANES]    Frequency of Communication with Friends and Family: More than three times a week    Frequency of Social Gatherings with Friends and Family: More than three times a week    Attends Religious Services: 1 to 4 times per year    Active Member of Clubs or  Organizations: No    Attends Club or Organization Meetings: 1 to 4 times per year    Marital Status: Widowed     Family History:  The patient's family history includes Coronary artery disease in his father.  His parents are deceased.  ROS General: Negative; No fevers, chills, or night sweats;  HEENT: Negative; No changes in vision or hearing, sinus congestion, difficulty swallowing Pulmonary: COVID-pneumonia with lobar and segmental pulmonary emboli in branches of the right upper lobe with small clot burden July 2023 Cardiovascular: See HPI GI: Negative; No nausea, vomiting, diarrhea, or abdominal pain GU: Negative; No dysuria, hematuria, or difficulty voiding Musculoskeletal: Arthritis Hematologic/Oncology: Negative; no easy bruising, bleeding Endocrine: Negative; no heat/cold intolerance; no diabetes Neuro: Negative; no changes in balance, headaches Skin: Negative; No rashes or skin lesions Psychiatric: Negative; No behavioral problems, depression Sleep: Negative; No snoring, daytime sleepiness, hypersomnolence, bruxism, restless legs, hypnogognic hallucinations, no cataplexy Other comprehensive 14 point system review is negative.   PHYSICAL EXAM:   VS:  BP (!) 130/54   Pulse 60   Ht 5' 7" (1.702 m)   Wt 160 lb 6.4 oz (72.8 kg)   BMI 25.12 kg/m     Repeat blood pressure by me was 120/58  Wt Readings from Last 3 Encounters:  12/23/21 160 lb 6.4 oz (72.8 kg)  11/03/21 156 lb 8.4 oz (71 kg)  10/14/21 156 lb 9.6 oz (71 kg)    General: Alert, oriented, no distress.  Skin: normal turgor, no rashes, warm and dry HEENT: Normocephalic, atraumatic. Pupils equal round and reactive to light; sclera anicteric; extraocular muscles intact; Nose without nasal septal hypertrophy Mouth/Parynx benign; Mallinpatti scale 3 Neck: No JVD, no carotid bruits; normal carotid upstroke Lungs: clear to ausculatation and percussion; no wheezing or rales Chest wall: without tenderness to  palpitation Heart: PMI not displaced, RRR, s1 s2 normal, 1/6 systolic murmur, no diastolic murmur, no rubs, gallops, thrills, or heaves Abdomen: soft, nontender; no hepatosplenomehaly, BS+; abdominal aorta nontender and not dilated by palpation. Back: no CVA tenderness Pulses 2+ Musculoskeletal: full range of motion, normal strength, no joint deformities Extremities: no clubbing cyanosis or edema,   Homan's sign negative  Neurologic: grossly nonfocal; Cranial nerves grossly wnl Psychologic: Normal mood and affect   Studies/Labs Reviewed:   December 23, 2021 ECG (independently read by me): Probable sinus rhythm at 60  QS V1-2  Recent Labs:    Latest Ref Rng & Units 11/04/2021    5:48 AM 11/03/2021    5:37 AM 11/02/2021    9:30 PM  BMP  Glucose 70 - 99 mg/dL 154  155    162  97   BUN 8 - 23 mg/dL _0 Creatinine 0.61 - 1.24 mg/dL 0.51  0.51    0.52  0.36   Sodium 135 - 145 mmol/L 134  133    132  139   Potassium 3.5 - 5.1 mmol/L 3.8  4.1    4.0  2.4   Chloride 98 - 111 mmol/L 108  104    104  120   CO2 22 - 32 mmol/L _1 Calcium 8.9 - 10.3 mg/dL 8.0  8.3    8.3  5.6         Latest Ref Rng & Units 11/04/2021    5:48 AM 11/03/2021    5:37 AM 11/02/2021    9:30 PM  Hepatic Function  Total Protein 6.5 - 8.1 g/dL 5.2  5.9  3.7   Albumin 3.5 - 5.0 g/dL 2.7  3.1  1.8   AST 15 - 41 U/L 63  61  45   ALT 0 - 44 U/L 37  39  24   Alk Phosphatase 38 - 126 U/L 74  83  52   Total Bilirubin 0.3 - 1.2 mg/dL 0.8  1.5  1.3        Latest Ref Rng & Units 11/05/2021    4:50 AM 11/04/2021    5:48 AM 11/03/2021    5:37 PM  CBC  WBC 4.0 - 10.5 K/uL 9.0  9.7  12.0   Hemoglobin 13.0 - 17.0 g/dL 11.1  11.4  12.1   Hematocrit 39.0 - 52.0 % 32.7  33.3  35.6   Platelets 150 - 400 K/uL 162  157  181    Lab Results  Component Value Date   MCV 94.0 11/05/2021   MCV 93.0 11/04/2021   MCV 92.0 11/03/2021   Lab Results  Component Value Date   TSH 2.039 11/02/2021    Lab Results  Component Value Date   HGBA1C 5.5 10/14/2021     BNP    Component Value Date/Time   BNP 368.7 (H) 11/03/2021 0537    ProBNP No results found for: "PROBNP"   Lipid Panel     Component Value Date/Time   CHOL 93 10/14/2021 1141   TRIG 71.0 10/14/2021 1141   HDL 45.60 10/14/2021 1141   CHOLHDL 2 10/14/2021 1141   VLDL 14.2 10/14/2021 1141   LDLCALC 33 10/14/2021 1141   LDLDIRECT 72.0 09/27/2017 0939     RADIOLOGY: No results found.   Additional studies/ records that were reviewed today include:   CATH/PCI: 06/06/2021 Conclusions: Severe single-vessel coronary artery disease with thrombotic 90% mid LAD stenosis and 99% subtotal occlusion of the distal LAD with TIMI-1 flow.  Mild, nonobstructive coronary artery disease noted in the LCx.  RCA is ectatic without significant stenosis. Normal left ventricular filling pressure (LVEDP ~7 mmHg). Successful PCI to mid and distal LAD using nonoverlapping Onyx Frontier 3.0 x 12 mm (mid  LAD) and 2.0 x 15 mm (distal LAD) drug-eluting stents with 0% residual stenosis and TIMI-2 flow into the distal vessel.  Poor reflow most likely related to microvascular dysfunction from late presentation.   Recommendations: Admit to 2H-ICU for post STEMI monitoring/care. Complete 2 hours of cangrelor infusion. Dual antiplatelet therapy with aspirin and ticagrelor for at least 12 months.  If atrial fibrillation persists during the hospitalization, we may need to consider transitioning to clopidogrel plus NOAC prior to discharge. Gentle post-catheterization hydration, given normal LVEDP but likely reduced LVEF. Obtain echocardiogram. Aggressive secondary prevention of coronary artery disease.    Intervention    ASSESSMENT:    1. ST elevation myocardial infarction involving left anterior descending (LAD) coronary artery [facility]): June 06, 2021   2. CAD S/P percutaneous coronary angioplasty   3. Essential hypertension   4.  Paroxysmal atrial fibrillation (HCC)   5. Hyperlipidemia LDL goal <70   6. Pulmonary embolism, unspecified chronicity, unspecified pulmonary embolism type, unspecified whether acute cor pulmonale present ([place])   7. Acute respiratory failure due to COVID-19 [facility]): July 2023   8. Aortic valve sclerosis   9. Medication management     PLAN:  Gabriel [name] is a very pleasant 88 year old gentleman who has a history of hypertension, hyperlipidemia, presented to [facility] on [facility] 06, 2021 with a STEMI involving his LAD.  He underwent successful emergent cardiac catheterization and stenting of his proximal to mid and mid distal LAD with DES stents.  He was treated with DAPT with aspirin/Brilinta as well as lipid-lowering therapy and beta-blocker therapy.  Apparently, [facility], he had fallen out his bed on several occasions and as result was transition to assisted living.  When last seen by Diona Browner, NP in May 2023, he was maintaining sinus rhythm.  Apparently, he developed COVID-pneumonia in July 2023 and was admitted to the hospital on November 02, 2021.  An echo Doppler study revealed normalization of LV function with EF at 60 to 65% with grade 2 diastolic dysfunction, mildly dilated left atrium, mild MR, and mild aortic sclerosis.  A chest CT demonstrated findings consistent with pneumonia and he was started on steroids M&M severe placed and placed on oxygen.  A chest CT however demonstrated lobar and segmental pulmonary emboli in branches of the right upper lobe with small overall clot burden.  There was a suggestion of possible right heart strain.  Apparently during his hospitalization he was started on Eliquis for anticoagulation, aspirin was discontinued, and he was told to maintain ticagrelor.  Subsequently, he has been stable and denies any significant bleeding episodes.  I have not seen him since his initial hospitalization in February 2023.  Presently, he is  without chest pain or shortness of breath.  It took me some time to discern what has transpired since his initial hospitalization.  Presently, I have recommended he discontinue ticagrelor since he is on Eliquis due to potential increased risk for bleed and have recommended substitution with clopidogrel.  He will take 150 mg tomorrow and then 75 mg daily.  He is 88 years old and does have a bleed risk.  It is only 2 months since his documented subsegmental pulmonary emboli in branches of his right upper lobe at which time he had small clot burden.  His ECG today confirms probable sinus rhythm with very low amplitude P waves.  Particularly with his age of 53 and fall risk, it may be possible to ultimately discontinue anticoagulation after 6 months  following his PE rather than treat indefinitely and if so at that time aspirin can be reinstituted with clopidogrel.  In the past he has been demonstrated to have some junctional bradycardia.  He has been on metoprolol tartrate 25 mg twice a day.  I have recommended he continue to take 25 mg in the morning but reduce his evening dose to 12.5 mg.  He has trace ankle swelling.  Blood pressure today is stable at 120/58.  He is on atorvastatin 40 mg for hyperlipidemia with target LDL less than 70.  He continues to be on pantoprazole 40 mg for GERD and takes gabapentin for neuropathy.  I have recommended a follow-up evaluation in 3 to 4 months and further recommendations will be made at that time.   Time spent: 45 minutes sneezing Medication Adjustments/Labs and Tests Ordered: Current medicines are reviewed at length with the patient today.  Concerns regarding medicines are outlined above.  Medication changes, Labs and Tests ordered today are listed in the Patient Instructions below. Patient Instructions  Medication Instructions:  STOP Brilinta (do not take dose tonight)   START Plavix (08/31) 150 mg, then decrease to 75 mg daily thereafter (starting 09/01)  Decrease  Metoprolol to 25 mg AM, and 12.5 mg PM.   *If you need a refill on your cardiac medications before your next appointment, please call your pharmacy*   Follow-Up: At [facility], you and your health needs are our priority.  As part of our continuing mission to provide you with exceptional heart care, we have created designated Provider Care Teams.  These Care Teams include your primary Cardiologist (physician) and Advanced Practice Providers (APPs -  Physician Assistants and Nurse Practitioners) who all work together to provide you with the care you need, when you need it.  We recommend signing up for the patient portal called "MyChart".  Sign up information is provided on this After Visit Summary.  MyChart is used to connect with patients for Virtual Visits (Telemedicine).  Patients are able to view lab/test results, encounter notes, upcoming appointments, etc.  Non-urgent messages can be sent to your provider as well.   To learn more about what you can do with MyChart, go to [email or web address].    Your next appointment:   3-4 month(s)  The format for your next appointment:   In Person  Provider:   Thomas Kelly, MD          Signed, Thomas Kelly, MD  12/24/2021 11:37 AM    [facility] [address], [address], [place], [place]  [address] Phone: [phone]    

## 2021-12-23 NOTE — Telephone Encounter (Signed)
Right knee stiff and painful, walks slow with walker, painful to walk. Wants to know if a cortisone shot would benefit him or another treatment.

## 2021-12-23 NOTE — Telephone Encounter (Signed)
Please advise 

## 2021-12-24 ENCOUNTER — Encounter: Payer: Self-pay | Admitting: Cardiovascular Disease

## 2021-12-24 DIAGNOSIS — U071 COVID-19: Secondary | ICD-10-CM | POA: Diagnosis not present

## 2021-12-24 DIAGNOSIS — R1311 Dysphagia, oral phase: Secondary | ICD-10-CM | POA: Diagnosis not present

## 2021-12-24 DIAGNOSIS — R1312 Dysphagia, oropharyngeal phase: Secondary | ICD-10-CM | POA: Diagnosis not present

## 2021-12-24 DIAGNOSIS — J9600 Acute respiratory failure, unspecified whether with hypoxia or hypercapnia: Secondary | ICD-10-CM | POA: Diagnosis not present

## 2021-12-24 DIAGNOSIS — R419 Unspecified symptoms and signs involving cognitive functions and awareness: Secondary | ICD-10-CM | POA: Diagnosis not present

## 2021-12-24 DIAGNOSIS — K219 Gastro-esophageal reflux disease without esophagitis: Secondary | ICD-10-CM | POA: Diagnosis not present

## 2021-12-25 DIAGNOSIS — R419 Unspecified symptoms and signs involving cognitive functions and awareness: Secondary | ICD-10-CM | POA: Diagnosis not present

## 2021-12-25 DIAGNOSIS — K219 Gastro-esophageal reflux disease without esophagitis: Secondary | ICD-10-CM | POA: Diagnosis not present

## 2021-12-25 DIAGNOSIS — J9600 Acute respiratory failure, unspecified whether with hypoxia or hypercapnia: Secondary | ICD-10-CM | POA: Diagnosis not present

## 2021-12-25 DIAGNOSIS — Z9181 History of falling: Secondary | ICD-10-CM | POA: Diagnosis not present

## 2021-12-25 DIAGNOSIS — R1312 Dysphagia, oropharyngeal phase: Secondary | ICD-10-CM | POA: Diagnosis not present

## 2021-12-25 DIAGNOSIS — M6281 Muscle weakness (generalized): Secondary | ICD-10-CM | POA: Diagnosis not present

## 2021-12-25 DIAGNOSIS — R262 Difficulty in walking, not elsewhere classified: Secondary | ICD-10-CM | POA: Diagnosis not present

## 2021-12-25 DIAGNOSIS — R29898 Other symptoms and signs involving the musculoskeletal system: Secondary | ICD-10-CM | POA: Diagnosis not present

## 2021-12-25 DIAGNOSIS — R1311 Dysphagia, oral phase: Secondary | ICD-10-CM | POA: Diagnosis not present

## 2021-12-25 DIAGNOSIS — U071 COVID-19: Secondary | ICD-10-CM | POA: Diagnosis not present

## 2021-12-28 DIAGNOSIS — K219 Gastro-esophageal reflux disease without esophagitis: Secondary | ICD-10-CM | POA: Diagnosis not present

## 2021-12-28 DIAGNOSIS — J9600 Acute respiratory failure, unspecified whether with hypoxia or hypercapnia: Secondary | ICD-10-CM | POA: Diagnosis not present

## 2021-12-28 DIAGNOSIS — U071 COVID-19: Secondary | ICD-10-CM | POA: Diagnosis not present

## 2021-12-28 DIAGNOSIS — R419 Unspecified symptoms and signs involving cognitive functions and awareness: Secondary | ICD-10-CM | POA: Diagnosis not present

## 2021-12-28 DIAGNOSIS — R1311 Dysphagia, oral phase: Secondary | ICD-10-CM | POA: Diagnosis not present

## 2021-12-28 DIAGNOSIS — R1312 Dysphagia, oropharyngeal phase: Secondary | ICD-10-CM | POA: Diagnosis not present

## 2021-12-29 NOTE — Telephone Encounter (Signed)
He should see his orthopedist (Dr. Frederik Pear) for this. He has already seen him for this problem

## 2021-12-30 DIAGNOSIS — R419 Unspecified symptoms and signs involving cognitive functions and awareness: Secondary | ICD-10-CM | POA: Diagnosis not present

## 2021-12-30 DIAGNOSIS — U071 COVID-19: Secondary | ICD-10-CM | POA: Diagnosis not present

## 2021-12-30 DIAGNOSIS — R1312 Dysphagia, oropharyngeal phase: Secondary | ICD-10-CM | POA: Diagnosis not present

## 2021-12-30 DIAGNOSIS — R1311 Dysphagia, oral phase: Secondary | ICD-10-CM | POA: Diagnosis not present

## 2021-12-30 DIAGNOSIS — K219 Gastro-esophageal reflux disease without esophagitis: Secondary | ICD-10-CM | POA: Diagnosis not present

## 2021-12-30 DIAGNOSIS — J9600 Acute respiratory failure, unspecified whether with hypoxia or hypercapnia: Secondary | ICD-10-CM | POA: Diagnosis not present

## 2021-12-31 DIAGNOSIS — R419 Unspecified symptoms and signs involving cognitive functions and awareness: Secondary | ICD-10-CM | POA: Diagnosis not present

## 2021-12-31 DIAGNOSIS — U071 COVID-19: Secondary | ICD-10-CM | POA: Diagnosis not present

## 2021-12-31 DIAGNOSIS — R1311 Dysphagia, oral phase: Secondary | ICD-10-CM | POA: Diagnosis not present

## 2021-12-31 DIAGNOSIS — R1312 Dysphagia, oropharyngeal phase: Secondary | ICD-10-CM | POA: Diagnosis not present

## 2021-12-31 DIAGNOSIS — K219 Gastro-esophageal reflux disease without esophagitis: Secondary | ICD-10-CM | POA: Diagnosis not present

## 2021-12-31 DIAGNOSIS — J9600 Acute respiratory failure, unspecified whether with hypoxia or hypercapnia: Secondary | ICD-10-CM | POA: Diagnosis not present

## 2021-12-31 NOTE — Telephone Encounter (Signed)
Called spoke with daughter [name] message given.  Voiced understanding.

## 2022-01-01 DIAGNOSIS — U071 COVID-19: Secondary | ICD-10-CM | POA: Diagnosis not present

## 2022-01-01 DIAGNOSIS — K219 Gastro-esophageal reflux disease without esophagitis: Secondary | ICD-10-CM | POA: Diagnosis not present

## 2022-01-01 DIAGNOSIS — J9600 Acute respiratory failure, unspecified whether with hypoxia or hypercapnia: Secondary | ICD-10-CM | POA: Diagnosis not present

## 2022-01-01 DIAGNOSIS — R419 Unspecified symptoms and signs involving cognitive functions and awareness: Secondary | ICD-10-CM | POA: Diagnosis not present

## 2022-01-01 DIAGNOSIS — R1311 Dysphagia, oral phase: Secondary | ICD-10-CM | POA: Diagnosis not present

## 2022-01-01 DIAGNOSIS — R1312 Dysphagia, oropharyngeal phase: Secondary | ICD-10-CM | POA: Diagnosis not present

## 2022-01-04 DIAGNOSIS — R1312 Dysphagia, oropharyngeal phase: Secondary | ICD-10-CM | POA: Diagnosis not present

## 2022-01-04 DIAGNOSIS — J9600 Acute respiratory failure, unspecified whether with hypoxia or hypercapnia: Secondary | ICD-10-CM | POA: Diagnosis not present

## 2022-01-04 DIAGNOSIS — K219 Gastro-esophageal reflux disease without esophagitis: Secondary | ICD-10-CM | POA: Diagnosis not present

## 2022-01-04 DIAGNOSIS — U071 COVID-19: Secondary | ICD-10-CM | POA: Diagnosis not present

## 2022-01-04 DIAGNOSIS — R1311 Dysphagia, oral phase: Secondary | ICD-10-CM | POA: Diagnosis not present

## 2022-01-04 DIAGNOSIS — R419 Unspecified symptoms and signs involving cognitive functions and awareness: Secondary | ICD-10-CM | POA: Diagnosis not present

## 2022-01-05 DIAGNOSIS — R1311 Dysphagia, oral phase: Secondary | ICD-10-CM | POA: Diagnosis not present

## 2022-01-05 DIAGNOSIS — U071 COVID-19: Secondary | ICD-10-CM | POA: Diagnosis not present

## 2022-01-05 DIAGNOSIS — J9600 Acute respiratory failure, unspecified whether with hypoxia or hypercapnia: Secondary | ICD-10-CM | POA: Diagnosis not present

## 2022-01-05 DIAGNOSIS — R1312 Dysphagia, oropharyngeal phase: Secondary | ICD-10-CM | POA: Diagnosis not present

## 2022-01-05 DIAGNOSIS — K219 Gastro-esophageal reflux disease without esophagitis: Secondary | ICD-10-CM | POA: Diagnosis not present

## 2022-01-05 DIAGNOSIS — R419 Unspecified symptoms and signs involving cognitive functions and awareness: Secondary | ICD-10-CM | POA: Diagnosis not present

## 2022-01-06 DIAGNOSIS — J9600 Acute respiratory failure, unspecified whether with hypoxia or hypercapnia: Secondary | ICD-10-CM | POA: Diagnosis not present

## 2022-01-06 DIAGNOSIS — R1312 Dysphagia, oropharyngeal phase: Secondary | ICD-10-CM | POA: Diagnosis not present

## 2022-01-06 DIAGNOSIS — K219 Gastro-esophageal reflux disease without esophagitis: Secondary | ICD-10-CM | POA: Diagnosis not present

## 2022-01-06 DIAGNOSIS — R1311 Dysphagia, oral phase: Secondary | ICD-10-CM | POA: Diagnosis not present

## 2022-01-06 DIAGNOSIS — R419 Unspecified symptoms and signs involving cognitive functions and awareness: Secondary | ICD-10-CM | POA: Diagnosis not present

## 2022-01-06 DIAGNOSIS — U071 COVID-19: Secondary | ICD-10-CM | POA: Diagnosis not present

## 2022-01-08 DIAGNOSIS — R1311 Dysphagia, oral phase: Secondary | ICD-10-CM | POA: Diagnosis not present

## 2022-01-08 DIAGNOSIS — U071 COVID-19: Secondary | ICD-10-CM | POA: Diagnosis not present

## 2022-01-08 DIAGNOSIS — R419 Unspecified symptoms and signs involving cognitive functions and awareness: Secondary | ICD-10-CM | POA: Diagnosis not present

## 2022-01-08 DIAGNOSIS — J9600 Acute respiratory failure, unspecified whether with hypoxia or hypercapnia: Secondary | ICD-10-CM | POA: Diagnosis not present

## 2022-01-08 DIAGNOSIS — R1312 Dysphagia, oropharyngeal phase: Secondary | ICD-10-CM | POA: Diagnosis not present

## 2022-01-08 DIAGNOSIS — K219 Gastro-esophageal reflux disease without esophagitis: Secondary | ICD-10-CM | POA: Diagnosis not present

## 2022-01-11 DIAGNOSIS — K219 Gastro-esophageal reflux disease without esophagitis: Secondary | ICD-10-CM | POA: Diagnosis not present

## 2022-01-11 DIAGNOSIS — R1312 Dysphagia, oropharyngeal phase: Secondary | ICD-10-CM | POA: Diagnosis not present

## 2022-01-11 DIAGNOSIS — J9600 Acute respiratory failure, unspecified whether with hypoxia or hypercapnia: Secondary | ICD-10-CM | POA: Diagnosis not present

## 2022-01-11 DIAGNOSIS — R1311 Dysphagia, oral phase: Secondary | ICD-10-CM | POA: Diagnosis not present

## 2022-01-11 DIAGNOSIS — U071 COVID-19: Secondary | ICD-10-CM | POA: Diagnosis not present

## 2022-01-11 DIAGNOSIS — R419 Unspecified symptoms and signs involving cognitive functions and awareness: Secondary | ICD-10-CM | POA: Diagnosis not present

## 2022-01-12 DIAGNOSIS — R1312 Dysphagia, oropharyngeal phase: Secondary | ICD-10-CM | POA: Diagnosis not present

## 2022-01-12 DIAGNOSIS — R1311 Dysphagia, oral phase: Secondary | ICD-10-CM | POA: Diagnosis not present

## 2022-01-12 DIAGNOSIS — R419 Unspecified symptoms and signs involving cognitive functions and awareness: Secondary | ICD-10-CM | POA: Diagnosis not present

## 2022-01-12 DIAGNOSIS — J9600 Acute respiratory failure, unspecified whether with hypoxia or hypercapnia: Secondary | ICD-10-CM | POA: Diagnosis not present

## 2022-01-12 DIAGNOSIS — K219 Gastro-esophageal reflux disease without esophagitis: Secondary | ICD-10-CM | POA: Diagnosis not present

## 2022-01-12 DIAGNOSIS — U071 COVID-19: Secondary | ICD-10-CM | POA: Diagnosis not present

## 2022-01-13 DIAGNOSIS — R419 Unspecified symptoms and signs involving cognitive functions and awareness: Secondary | ICD-10-CM | POA: Diagnosis not present

## 2022-01-13 DIAGNOSIS — R1312 Dysphagia, oropharyngeal phase: Secondary | ICD-10-CM | POA: Diagnosis not present

## 2022-01-13 DIAGNOSIS — R1311 Dysphagia, oral phase: Secondary | ICD-10-CM | POA: Diagnosis not present

## 2022-01-13 DIAGNOSIS — K219 Gastro-esophageal reflux disease without esophagitis: Secondary | ICD-10-CM | POA: Diagnosis not present

## 2022-01-13 DIAGNOSIS — U071 COVID-19: Secondary | ICD-10-CM | POA: Diagnosis not present

## 2022-01-13 DIAGNOSIS — J9600 Acute respiratory failure, unspecified whether with hypoxia or hypercapnia: Secondary | ICD-10-CM | POA: Diagnosis not present

## 2022-01-14 DIAGNOSIS — R1312 Dysphagia, oropharyngeal phase: Secondary | ICD-10-CM | POA: Diagnosis not present

## 2022-01-14 DIAGNOSIS — J9600 Acute respiratory failure, unspecified whether with hypoxia or hypercapnia: Secondary | ICD-10-CM | POA: Diagnosis not present

## 2022-01-14 DIAGNOSIS — K219 Gastro-esophageal reflux disease without esophagitis: Secondary | ICD-10-CM | POA: Diagnosis not present

## 2022-01-14 DIAGNOSIS — R419 Unspecified symptoms and signs involving cognitive functions and awareness: Secondary | ICD-10-CM | POA: Diagnosis not present

## 2022-01-14 DIAGNOSIS — R1311 Dysphagia, oral phase: Secondary | ICD-10-CM | POA: Diagnosis not present

## 2022-01-14 DIAGNOSIS — U071 COVID-19: Secondary | ICD-10-CM | POA: Diagnosis not present

## 2022-01-15 DIAGNOSIS — R1312 Dysphagia, oropharyngeal phase: Secondary | ICD-10-CM | POA: Diagnosis not present

## 2022-01-15 DIAGNOSIS — R419 Unspecified symptoms and signs involving cognitive functions and awareness: Secondary | ICD-10-CM | POA: Diagnosis not present

## 2022-01-15 DIAGNOSIS — K219 Gastro-esophageal reflux disease without esophagitis: Secondary | ICD-10-CM | POA: Diagnosis not present

## 2022-01-15 DIAGNOSIS — J9600 Acute respiratory failure, unspecified whether with hypoxia or hypercapnia: Secondary | ICD-10-CM | POA: Diagnosis not present

## 2022-01-15 DIAGNOSIS — R1311 Dysphagia, oral phase: Secondary | ICD-10-CM | POA: Diagnosis not present

## 2022-01-15 DIAGNOSIS — U071 COVID-19: Secondary | ICD-10-CM | POA: Diagnosis not present

## 2022-01-18 DIAGNOSIS — R1311 Dysphagia, oral phase: Secondary | ICD-10-CM | POA: Diagnosis not present

## 2022-01-18 DIAGNOSIS — U071 COVID-19: Secondary | ICD-10-CM | POA: Diagnosis not present

## 2022-01-18 DIAGNOSIS — K219 Gastro-esophageal reflux disease without esophagitis: Secondary | ICD-10-CM | POA: Diagnosis not present

## 2022-01-18 DIAGNOSIS — J9600 Acute respiratory failure, unspecified whether with hypoxia or hypercapnia: Secondary | ICD-10-CM | POA: Diagnosis not present

## 2022-01-18 DIAGNOSIS — R419 Unspecified symptoms and signs involving cognitive functions and awareness: Secondary | ICD-10-CM | POA: Diagnosis not present

## 2022-01-18 DIAGNOSIS — R1312 Dysphagia, oropharyngeal phase: Secondary | ICD-10-CM | POA: Diagnosis not present

## 2022-01-19 DIAGNOSIS — C4491 Basal cell carcinoma of skin, unspecified: Secondary | ICD-10-CM | POA: Diagnosis not present

## 2022-01-19 DIAGNOSIS — L814 Other melanin hyperpigmentation: Secondary | ICD-10-CM | POA: Diagnosis not present

## 2022-01-19 DIAGNOSIS — L579 Skin changes due to chronic exposure to nonionizing radiation, unspecified: Secondary | ICD-10-CM | POA: Diagnosis not present

## 2022-01-19 DIAGNOSIS — L570 Actinic keratosis: Secondary | ICD-10-CM | POA: Diagnosis not present

## 2022-01-19 DIAGNOSIS — Z85828 Personal history of other malignant neoplasm of skin: Secondary | ICD-10-CM | POA: Diagnosis not present

## 2022-01-20 DIAGNOSIS — J9600 Acute respiratory failure, unspecified whether with hypoxia or hypercapnia: Secondary | ICD-10-CM | POA: Diagnosis not present

## 2022-01-20 DIAGNOSIS — R1312 Dysphagia, oropharyngeal phase: Secondary | ICD-10-CM | POA: Diagnosis not present

## 2022-01-20 DIAGNOSIS — K219 Gastro-esophageal reflux disease without esophagitis: Secondary | ICD-10-CM | POA: Diagnosis not present

## 2022-01-20 DIAGNOSIS — U071 COVID-19: Secondary | ICD-10-CM | POA: Diagnosis not present

## 2022-01-20 DIAGNOSIS — R1311 Dysphagia, oral phase: Secondary | ICD-10-CM | POA: Diagnosis not present

## 2022-01-20 DIAGNOSIS — R419 Unspecified symptoms and signs involving cognitive functions and awareness: Secondary | ICD-10-CM | POA: Diagnosis not present

## 2022-01-21 DIAGNOSIS — K219 Gastro-esophageal reflux disease without esophagitis: Secondary | ICD-10-CM | POA: Diagnosis not present

## 2022-01-21 DIAGNOSIS — R1312 Dysphagia, oropharyngeal phase: Secondary | ICD-10-CM | POA: Diagnosis not present

## 2022-01-21 DIAGNOSIS — R1311 Dysphagia, oral phase: Secondary | ICD-10-CM | POA: Diagnosis not present

## 2022-01-21 DIAGNOSIS — U071 COVID-19: Secondary | ICD-10-CM | POA: Diagnosis not present

## 2022-01-21 DIAGNOSIS — J9600 Acute respiratory failure, unspecified whether with hypoxia or hypercapnia: Secondary | ICD-10-CM | POA: Diagnosis not present

## 2022-01-21 DIAGNOSIS — R419 Unspecified symptoms and signs involving cognitive functions and awareness: Secondary | ICD-10-CM | POA: Diagnosis not present

## 2022-01-22 DIAGNOSIS — K219 Gastro-esophageal reflux disease without esophagitis: Secondary | ICD-10-CM | POA: Diagnosis not present

## 2022-01-22 DIAGNOSIS — J9600 Acute respiratory failure, unspecified whether with hypoxia or hypercapnia: Secondary | ICD-10-CM | POA: Diagnosis not present

## 2022-01-22 DIAGNOSIS — R1311 Dysphagia, oral phase: Secondary | ICD-10-CM | POA: Diagnosis not present

## 2022-01-22 DIAGNOSIS — R1312 Dysphagia, oropharyngeal phase: Secondary | ICD-10-CM | POA: Diagnosis not present

## 2022-01-22 DIAGNOSIS — U071 COVID-19: Secondary | ICD-10-CM | POA: Diagnosis not present

## 2022-01-22 DIAGNOSIS — R419 Unspecified symptoms and signs involving cognitive functions and awareness: Secondary | ICD-10-CM | POA: Diagnosis not present

## 2022-01-26 DIAGNOSIS — M25561 Pain in right knee: Secondary | ICD-10-CM | POA: Diagnosis not present

## 2022-01-28 NOTE — Telephone Encounter (Signed)
Pt is calling and he saw dr Mayer Camel on 01-25-2022 and per pt  dr Mayer Camel said they is nothing he can do and he is using some cream that is helping his knee

## 2022-01-29 NOTE — Telephone Encounter (Signed)
Noted  

## 2022-02-17 ENCOUNTER — Telehealth: Payer: Self-pay | Admitting: Family Medicine

## 2022-02-17 NOTE — Telephone Encounter (Signed)
Patient in nursing home and started OTC Mucinex over the weekend, coughing up yellow phlegm. Needs an order for him to continue taking order should include frequency. Patient has been taking 2xd.Can be faxed to [phone]

## 2022-02-18 NOTE — Telephone Encounter (Signed)
Spoke with a nurse at [facility] state that they received pt order to take Mucinex BID as needed

## 2022-02-18 NOTE — Telephone Encounter (Signed)
The order is ready to be faxed

## 2022-02-18 NOTE — Telephone Encounter (Signed)
Patient called regarding order for occupational therapy for his neck pain. I let patient know that the order was in Dr.Fry's file.          Please advise

## 2022-02-23 ENCOUNTER — Ambulatory Visit (INDEPENDENT_AMBULATORY_CARE_PROVIDER_SITE_OTHER): Payer: Medicare Other | Admitting: Family Medicine

## 2022-02-23 ENCOUNTER — Encounter: Payer: Self-pay | Admitting: Family Medicine

## 2022-02-23 VITALS — BP 110/60 | HR 52 | Temp 98.1°F | Wt 161.4 lb

## 2022-02-23 DIAGNOSIS — I255 Ischemic cardiomyopathy: Secondary | ICD-10-CM

## 2022-02-23 DIAGNOSIS — L602 Onychogryphosis: Secondary | ICD-10-CM | POA: Diagnosis not present

## 2022-02-23 DIAGNOSIS — J019 Acute sinusitis, unspecified: Secondary | ICD-10-CM

## 2022-02-23 MED ORDER — AZITHROMYCIN 250 MG PO TABS: ORAL_TABLET | ORAL | 0 refills | Status: DC

## 2022-02-23 NOTE — Progress Notes (Signed)
   Subjective:    Patient ID: Gabriel [name], male    DOB: Jan 08, 1925, 88 y.o.   MRN: [id number]  HPI Here with his daughter for 10 days of stuffy head, runny nose, and a dry cough. No fever or SOB. He has tested negative for the Covid virus twice.    Review of Systems  Constitutional: Negative.   HENT:  Positive for congestion, postnasal drip, rhinorrhea and sinus pressure. Negative for ear pain and sore throat.   Eyes: Negative.   Respiratory:  Positive for cough. Negative for shortness of breath and wheezing.        Objective:   Physical Exam Constitutional:      Appearance: Normal appearance. He is not ill-appearing.  HENT:     Right Ear: Tympanic membrane, ear canal and external ear normal.     Left Ear: Tympanic membrane, ear canal and external ear normal.     Nose: Nose normal.     Mouth/Throat:     Pharynx: Oropharynx is clear.  Eyes:     Conjunctiva/sclera: Conjunctivae normal.  Pulmonary:     Effort: Pulmonary effort is normal.     Breath sounds: Normal breath sounds.  Lymphadenopathy:     Cervical: No cervical adenopathy.  Neurological:     Mental Status: He is alert.           Assessment & Plan:  Sinusitis, treat with a Zpack. Use Robitussin DM as needed. We will also refer to Podiatry to trim his toenails.  Alysia Penna, MD

## 2022-02-25 DIAGNOSIS — M25561 Pain in right knee: Secondary | ICD-10-CM | POA: Diagnosis not present

## 2022-02-25 DIAGNOSIS — M6281 Muscle weakness (generalized): Secondary | ICD-10-CM | POA: Diagnosis not present

## 2022-02-25 DIAGNOSIS — R278 Other lack of coordination: Secondary | ICD-10-CM | POA: Diagnosis not present

## 2022-02-25 DIAGNOSIS — R29898 Other symptoms and signs involving the musculoskeletal system: Secondary | ICD-10-CM | POA: Diagnosis not present

## 2022-02-25 DIAGNOSIS — R262 Difficulty in walking, not elsewhere classified: Secondary | ICD-10-CM | POA: Diagnosis not present

## 2022-02-25 DIAGNOSIS — R2681 Unsteadiness on feet: Secondary | ICD-10-CM | POA: Diagnosis not present

## 2022-02-26 DIAGNOSIS — M25561 Pain in right knee: Secondary | ICD-10-CM | POA: Diagnosis not present

## 2022-02-26 DIAGNOSIS — R262 Difficulty in walking, not elsewhere classified: Secondary | ICD-10-CM | POA: Diagnosis not present

## 2022-02-26 DIAGNOSIS — M6281 Muscle weakness (generalized): Secondary | ICD-10-CM | POA: Diagnosis not present

## 2022-02-26 DIAGNOSIS — R2681 Unsteadiness on feet: Secondary | ICD-10-CM | POA: Diagnosis not present

## 2022-02-26 DIAGNOSIS — R29898 Other symptoms and signs involving the musculoskeletal system: Secondary | ICD-10-CM | POA: Diagnosis not present

## 2022-02-26 DIAGNOSIS — R278 Other lack of coordination: Secondary | ICD-10-CM | POA: Diagnosis not present

## 2022-03-01 DIAGNOSIS — M6281 Muscle weakness (generalized): Secondary | ICD-10-CM | POA: Diagnosis not present

## 2022-03-01 DIAGNOSIS — R2681 Unsteadiness on feet: Secondary | ICD-10-CM | POA: Diagnosis not present

## 2022-03-01 DIAGNOSIS — M25561 Pain in right knee: Secondary | ICD-10-CM | POA: Diagnosis not present

## 2022-03-01 DIAGNOSIS — R278 Other lack of coordination: Secondary | ICD-10-CM | POA: Diagnosis not present

## 2022-03-01 DIAGNOSIS — R262 Difficulty in walking, not elsewhere classified: Secondary | ICD-10-CM | POA: Diagnosis not present

## 2022-03-01 DIAGNOSIS — R29898 Other symptoms and signs involving the musculoskeletal system: Secondary | ICD-10-CM | POA: Diagnosis not present

## 2022-03-03 DIAGNOSIS — R262 Difficulty in walking, not elsewhere classified: Secondary | ICD-10-CM | POA: Diagnosis not present

## 2022-03-03 DIAGNOSIS — R29898 Other symptoms and signs involving the musculoskeletal system: Secondary | ICD-10-CM | POA: Diagnosis not present

## 2022-03-03 DIAGNOSIS — R2681 Unsteadiness on feet: Secondary | ICD-10-CM | POA: Diagnosis not present

## 2022-03-03 DIAGNOSIS — R278 Other lack of coordination: Secondary | ICD-10-CM | POA: Diagnosis not present

## 2022-03-03 DIAGNOSIS — M25561 Pain in right knee: Secondary | ICD-10-CM | POA: Diagnosis not present

## 2022-03-03 DIAGNOSIS — M6281 Muscle weakness (generalized): Secondary | ICD-10-CM | POA: Diagnosis not present

## 2022-03-05 DIAGNOSIS — R2681 Unsteadiness on feet: Secondary | ICD-10-CM | POA: Diagnosis not present

## 2022-03-05 DIAGNOSIS — M6281 Muscle weakness (generalized): Secondary | ICD-10-CM | POA: Diagnosis not present

## 2022-03-05 DIAGNOSIS — R262 Difficulty in walking, not elsewhere classified: Secondary | ICD-10-CM | POA: Diagnosis not present

## 2022-03-05 DIAGNOSIS — R29898 Other symptoms and signs involving the musculoskeletal system: Secondary | ICD-10-CM | POA: Diagnosis not present

## 2022-03-05 DIAGNOSIS — R278 Other lack of coordination: Secondary | ICD-10-CM | POA: Diagnosis not present

## 2022-03-05 DIAGNOSIS — M25561 Pain in right knee: Secondary | ICD-10-CM | POA: Diagnosis not present

## 2022-03-08 ENCOUNTER — Encounter: Payer: Self-pay | Admitting: Podiatry

## 2022-03-08 ENCOUNTER — Ambulatory Visit (INDEPENDENT_AMBULATORY_CARE_PROVIDER_SITE_OTHER): Payer: Medicare Other | Admitting: Podiatry

## 2022-03-08 DIAGNOSIS — B351 Tinea unguium: Secondary | ICD-10-CM | POA: Diagnosis not present

## 2022-03-08 DIAGNOSIS — G609 Hereditary and idiopathic neuropathy, unspecified: Secondary | ICD-10-CM

## 2022-03-08 DIAGNOSIS — R278 Other lack of coordination: Secondary | ICD-10-CM | POA: Diagnosis not present

## 2022-03-08 DIAGNOSIS — M79674 Pain in right toe(s): Secondary | ICD-10-CM | POA: Diagnosis not present

## 2022-03-08 DIAGNOSIS — M25561 Pain in right knee: Secondary | ICD-10-CM | POA: Diagnosis not present

## 2022-03-08 DIAGNOSIS — R2681 Unsteadiness on feet: Secondary | ICD-10-CM | POA: Diagnosis not present

## 2022-03-08 DIAGNOSIS — R262 Difficulty in walking, not elsewhere classified: Secondary | ICD-10-CM | POA: Diagnosis not present

## 2022-03-08 DIAGNOSIS — R29898 Other symptoms and signs involving the musculoskeletal system: Secondary | ICD-10-CM | POA: Diagnosis not present

## 2022-03-08 DIAGNOSIS — M79675 Pain in left toe(s): Secondary | ICD-10-CM

## 2022-03-08 DIAGNOSIS — D689 Coagulation defect, unspecified: Secondary | ICD-10-CM | POA: Diagnosis not present

## 2022-03-08 DIAGNOSIS — I255 Ischemic cardiomyopathy: Secondary | ICD-10-CM

## 2022-03-08 DIAGNOSIS — M6281 Muscle weakness (generalized): Secondary | ICD-10-CM | POA: Diagnosis not present

## 2022-03-08 NOTE — Progress Notes (Signed)
This patient presents to the office with chief complaint of long thick painful nails.  Patient says the nails are painful walking and wearing shoes.  This patient is unable to self treat.  This patient is unable to trim his nails since she is unable to reach his nails.  he presents to the office for preventative foot care services.  He presents to the office with his daughter.  He has neuropathy and coagulation defect.  General Appearance  Alert, conversant and in no acute stress.  Vascular  Dorsalis pedis and posterior tibial  pulses are absent  bilaterally.  Capillary return is within normal limits  bilaterally. Temperature is within normal limits  bilaterally.  Neurologic  Senn-Weinstein monofilament wire test within normal limits  bilaterally. Muscle power within normal limits bilaterally.  Nails Thick disfigured discolored nails with subungual debris  from hallux to fifth toes bilaterally. No evidence of bacterial infection or drainage bilaterally.  Orthopedic  No limitations of motion  feet .  No crepitus or effusions noted.  No bony pathology or digital deformities noted.  Skin  normotropic skin with no porokeratosis noted bilaterally.  No signs of infections or ulcers noted.     Onychomycosis  Nails  B/L.  Pain in right toes  Pain in left toes  Debridement of nails both feet followed trimming the nails with dremel tool.    RTC 3 months.   Gardiner Barefoot DPM

## 2022-03-10 ENCOUNTER — Ambulatory Visit: Payer: Medicare Other | Admitting: Podiatry

## 2022-03-10 DIAGNOSIS — M6281 Muscle weakness (generalized): Secondary | ICD-10-CM | POA: Diagnosis not present

## 2022-03-10 DIAGNOSIS — R262 Difficulty in walking, not elsewhere classified: Secondary | ICD-10-CM | POA: Diagnosis not present

## 2022-03-10 DIAGNOSIS — M25561 Pain in right knee: Secondary | ICD-10-CM | POA: Diagnosis not present

## 2022-03-10 DIAGNOSIS — R2681 Unsteadiness on feet: Secondary | ICD-10-CM | POA: Diagnosis not present

## 2022-03-10 DIAGNOSIS — R278 Other lack of coordination: Secondary | ICD-10-CM | POA: Diagnosis not present

## 2022-03-10 DIAGNOSIS — R29898 Other symptoms and signs involving the musculoskeletal system: Secondary | ICD-10-CM | POA: Diagnosis not present

## 2022-03-12 ENCOUNTER — Ambulatory Visit: Payer: Medicare Other | Admitting: Podiatry

## 2022-03-12 DIAGNOSIS — R29898 Other symptoms and signs involving the musculoskeletal system: Secondary | ICD-10-CM | POA: Diagnosis not present

## 2022-03-12 DIAGNOSIS — M25561 Pain in right knee: Secondary | ICD-10-CM | POA: Diagnosis not present

## 2022-03-12 DIAGNOSIS — R278 Other lack of coordination: Secondary | ICD-10-CM | POA: Diagnosis not present

## 2022-03-12 DIAGNOSIS — R2681 Unsteadiness on feet: Secondary | ICD-10-CM | POA: Diagnosis not present

## 2022-03-12 DIAGNOSIS — R262 Difficulty in walking, not elsewhere classified: Secondary | ICD-10-CM | POA: Diagnosis not present

## 2022-03-12 DIAGNOSIS — M6281 Muscle weakness (generalized): Secondary | ICD-10-CM | POA: Diagnosis not present

## 2022-03-15 ENCOUNTER — Ambulatory Visit: Payer: Medicare Other | Admitting: Podiatry

## 2022-03-15 DIAGNOSIS — R262 Difficulty in walking, not elsewhere classified: Secondary | ICD-10-CM | POA: Diagnosis not present

## 2022-03-15 DIAGNOSIS — R278 Other lack of coordination: Secondary | ICD-10-CM | POA: Diagnosis not present

## 2022-03-15 DIAGNOSIS — M6281 Muscle weakness (generalized): Secondary | ICD-10-CM | POA: Diagnosis not present

## 2022-03-15 DIAGNOSIS — M25561 Pain in right knee: Secondary | ICD-10-CM | POA: Diagnosis not present

## 2022-03-15 DIAGNOSIS — R2681 Unsteadiness on feet: Secondary | ICD-10-CM | POA: Diagnosis not present

## 2022-03-15 DIAGNOSIS — R29898 Other symptoms and signs involving the musculoskeletal system: Secondary | ICD-10-CM | POA: Diagnosis not present

## 2022-03-17 DIAGNOSIS — R278 Other lack of coordination: Secondary | ICD-10-CM | POA: Diagnosis not present

## 2022-03-17 DIAGNOSIS — R29898 Other symptoms and signs involving the musculoskeletal system: Secondary | ICD-10-CM | POA: Diagnosis not present

## 2022-03-17 DIAGNOSIS — M6281 Muscle weakness (generalized): Secondary | ICD-10-CM | POA: Diagnosis not present

## 2022-03-17 DIAGNOSIS — R2681 Unsteadiness on feet: Secondary | ICD-10-CM | POA: Diagnosis not present

## 2022-03-17 DIAGNOSIS — R262 Difficulty in walking, not elsewhere classified: Secondary | ICD-10-CM | POA: Diagnosis not present

## 2022-03-17 DIAGNOSIS — M25561 Pain in right knee: Secondary | ICD-10-CM | POA: Diagnosis not present

## 2022-03-19 DIAGNOSIS — M25561 Pain in right knee: Secondary | ICD-10-CM | POA: Diagnosis not present

## 2022-03-19 DIAGNOSIS — R262 Difficulty in walking, not elsewhere classified: Secondary | ICD-10-CM | POA: Diagnosis not present

## 2022-03-19 DIAGNOSIS — R2681 Unsteadiness on feet: Secondary | ICD-10-CM | POA: Diagnosis not present

## 2022-03-19 DIAGNOSIS — M6281 Muscle weakness (generalized): Secondary | ICD-10-CM | POA: Diagnosis not present

## 2022-03-19 DIAGNOSIS — R29898 Other symptoms and signs involving the musculoskeletal system: Secondary | ICD-10-CM | POA: Diagnosis not present

## 2022-03-19 DIAGNOSIS — R278 Other lack of coordination: Secondary | ICD-10-CM | POA: Diagnosis not present

## 2022-03-22 DIAGNOSIS — R278 Other lack of coordination: Secondary | ICD-10-CM | POA: Diagnosis not present

## 2022-03-22 DIAGNOSIS — M6281 Muscle weakness (generalized): Secondary | ICD-10-CM | POA: Diagnosis not present

## 2022-03-22 DIAGNOSIS — M25561 Pain in right knee: Secondary | ICD-10-CM | POA: Diagnosis not present

## 2022-03-22 DIAGNOSIS — R262 Difficulty in walking, not elsewhere classified: Secondary | ICD-10-CM | POA: Diagnosis not present

## 2022-03-22 DIAGNOSIS — R29898 Other symptoms and signs involving the musculoskeletal system: Secondary | ICD-10-CM | POA: Diagnosis not present

## 2022-03-22 DIAGNOSIS — R2681 Unsteadiness on feet: Secondary | ICD-10-CM | POA: Diagnosis not present

## 2022-03-23 ENCOUNTER — Emergency Department (HOSPITAL_COMMUNITY): Payer: Medicare Other

## 2022-03-23 ENCOUNTER — Other Ambulatory Visit: Payer: Self-pay

## 2022-03-23 ENCOUNTER — Emergency Department (HOSPITAL_COMMUNITY)
Admission: EM | Admit: 2022-03-23 | Discharge: 2022-03-24 | Disposition: A | Discharge status: Home / Self Care | Payer: Medicare Other | Attending: Emergency Medicine | Admitting: Emergency Medicine

## 2022-03-23 DIAGNOSIS — L579 Skin changes due to chronic exposure to nonionizing radiation, unspecified: Secondary | ICD-10-CM | POA: Diagnosis not present

## 2022-03-23 DIAGNOSIS — R0689 Other abnormalities of breathing: Secondary | ICD-10-CM | POA: Diagnosis not present

## 2022-03-23 DIAGNOSIS — W19XXXA Unspecified fall, initial encounter: Secondary | ICD-10-CM | POA: Diagnosis not present

## 2022-03-23 DIAGNOSIS — R531 Weakness: Secondary | ICD-10-CM | POA: Diagnosis not present

## 2022-03-23 DIAGNOSIS — S01111A Laceration without foreign body of right eyelid and periocular area, initial encounter: Secondary | ICD-10-CM | POA: Diagnosis not present

## 2022-03-23 DIAGNOSIS — R58 Hemorrhage, not elsewhere classified: Secondary | ICD-10-CM | POA: Diagnosis not present

## 2022-03-23 DIAGNOSIS — S0990XA Unspecified injury of head, initial encounter: Secondary | ICD-10-CM

## 2022-03-23 DIAGNOSIS — L814 Other melanin hyperpigmentation: Secondary | ICD-10-CM | POA: Diagnosis not present

## 2022-03-23 DIAGNOSIS — L821 Other seborrheic keratosis: Secondary | ICD-10-CM | POA: Diagnosis not present

## 2022-03-23 DIAGNOSIS — Z85828 Personal history of other malignant neoplasm of skin: Secondary | ICD-10-CM | POA: Diagnosis not present

## 2022-03-23 LAB — CBC WITH DIFFERENTIAL/PLATELET
Abs Immature Granulocytes: 0.04 10*3/uL (ref 0.00–0.07)
Basophils Absolute: 0.1 10*3/uL (ref 0.0–0.1)
Basophils Relative: 1 %
Eosinophils Absolute: 0.1 10*3/uL (ref 0.0–0.5)
Eosinophils Relative: 2 %
HCT: 38.5 % — ABNORMAL LOW (ref 39.0–52.0)
Hemoglobin: 12.8 g/dL — ABNORMAL LOW (ref 13.0–17.0)
Immature Granulocytes: 1 %
Lymphocytes Relative: 19 %
Lymphs Abs: 0.9 10*3/uL (ref 0.7–4.0)
MCH: 31.3 pg (ref 26.0–34.0)
MCHC: 33.2 g/dL (ref 30.0–36.0)
MCV: 94.1 fL (ref 80.0–100.0)
Monocytes Absolute: 0.6 10*3/uL (ref 0.1–1.0)
Monocytes Relative: 11 %
Neutro Abs: 3.3 10*3/uL (ref 1.7–7.7)
Neutrophils Relative %: 66 %
Platelets: 216 10*3/uL (ref 150–400)
RBC: 4.09 MIL/uL — ABNORMAL LOW (ref 4.22–5.81)
RDW: 13.2 % (ref 11.5–15.5)
WBC: 5 10*3/uL (ref 4.0–10.5)
nRBC: 0 % (ref 0.0–0.2)

## 2022-03-23 LAB — BASIC METABOLIC PANEL
Anion gap: 8 (ref 5–15)
BUN: 16 mg/dL (ref 8–23)
CO2: 23 mmol/L (ref 22–32)
Calcium: 8.5 mg/dL — ABNORMAL LOW (ref 8.9–10.3)
Chloride: 101 mmol/L (ref 98–111)
Creatinine, Ser: 0.77 mg/dL (ref 0.61–1.24)
GFR, Estimated: >60 mL/min (ref >60)
Glucose, Bld: 148 mg/dL — ABNORMAL HIGH (ref 70–99)
Potassium: 4.1 mmol/L (ref 3.5–5.1)
Sodium: 132 mmol/L — ABNORMAL LOW (ref 135–145)

## 2022-03-23 NOTE — ED Notes (Signed)
Trauma Response Nurse Documentation   Gabriel [name] is a 88 y.o. male arriving to [facility] ED via [facility]  On Eliquis (apixaban) daily. Trauma was activated as a Level 2 by [place], Agricultural consultant based on the following trauma criteria Elderly patients > 65 with head trauma on anti-coagulation (excluding ASA). Trauma team at the bedside on patient arrival.   Patient cleared for CT by Dr. Francia Greaves. Pt transported to CT with trauma response nurse present to monitor. RN remained with the patient throughout their absence from the department for clinical observation.   GCS 15.  History   Past Medical History:  Diagnosis Date   Arthritis    Diverticulosis of colon    External thrombosed hemorrhoids 06/11/2009   Qualifier: Diagnosis of  By: Sarajane Jews MD, Ishmael Holter    GERD (gastroesophageal reflux disease)    Hypertension      Past Surgical History:  Procedure Laterality Date   cataract extaction, right  6/09   per Dr. [name]   CATARACT EXTRACTION Left    COLONOSCOPY  09/23/04   per Dr. [name], clear, no repeats needed    CORONARY STENT INTERVENTION N/A 06/06/2021   Procedure: CORONARY STENT INTERVENTION;  Surgeon: [name], MD;  Location: [place] [facility];  Service: Cardiovascular;  Laterality: N/A;   CORONARY/GRAFT ACUTE MI REVASCULARIZATION N/A 06/06/2021   Procedure: Coronary/Graft Acute MI Revascularization;  Surgeon: [name], MD;  Location: [place] [facility];  Service: Cardiovascular;  Laterality: N/A;   EGD and esophageal diatation,  05-10-11   per Dr. [name]     HERNIA REPAIR     LEFT HEART CATH AND CORONARY ANGIOGRAPHY N/A 06/06/2021   Procedure: LEFT HEART CATH AND CORONARY ANGIOGRAPHY;  Surgeon: [name], MD;  Location: [place] [facility];  Service: Cardiovascular;  Laterality: N/A;   rt inguinal hernia     x2       Initial Focused Assessment (If applicable, or please see trauma documentation): Airway-- intact, no visible  obstruction Breathing-- spontaneous and unlabored Circulation-- bleeding noted from laceration above right eye, bleeding controlled CT's Completed:   CT Head and CT C-Spine   Interventions:  See event summary  Plan for disposition:  Unknown at this time  Consults completed:  none at 0400.  Event Summary: Patient brought in by [facility]. Patient had unwitnessed fall this evening, walker got too far from patient and he fell, striking his right side and head. Patient A&Ox4, GCS 15 upon arrival. Laceration noted above right eye. 20G PIV LAC established. Labs obtained. CT head, c-spine completed. Xray chest, pelvis, right knee completed.   Bedside handoff with ED RN Ander Purpura.    Trudee Kuster  Trauma Response RN  Please call TRN at [phone] for further assistance.

## 2022-03-23 NOTE — ED Triage Notes (Addendum)
BIB EMS for unwitnessed fall after attempting to use restroom with walker. Fell backwards. Staff found patient lethargic slow to respond. Right lac on eyebrow and right elbow pain. On blood thinners

## 2022-03-23 NOTE — ED Provider Notes (Signed)
[facility] Emergency Department Provider Note MRN:  [id number]  Arrival date & time: 03/24/22     Chief Complaint   Fall   History of Present Illness   Gabriel [name] is a 88 y.o. year-old male presents to the ED with chief complaint of mechanical fall. BIB EMS for an unwitnessed fall.  Per EMS, sounds like the patient was walking backward after having used the bathroom and fell to the floor.  He hit his right forehead, right elbow, and right knee.  Denies LOC.  EMS reports that he was a bit confused when they picked him up, but he is more alert now.  History provided by patient.   Review of Systems  Pertinent positive and negative review of systems noted in HPI.    Physical Exam   Vitals:   03/24/22 0132 03/24/22 0414  BP: (!) 187/75 130/70  Pulse: 74 63  Resp: 17 14  Temp: 98.1 F (36.7 C) 98.1 F (36.7 C)  SpO2: 98% 94%    CONSTITUTIONAL:  well-appearing, NAD NEURO:  Alert and oriented x 3, CN 3-12 grossly intact EYES:  eyes equal and reactive ENT/NECK:  Supple, no stridor  CARDIO:  normal rate, regular rhythm, appears well-perfused  PULM:  No respiratory distress, CTAB GI/GU:  non-distended, no focal tenderness MSK/SPINE:  No gross deformities, no edema, moves all extremities  SKIN:  no rash, atraumatic, 0.5 cm laceration to right eyebrow   *Additional and/or pertinent findings included in MDM below  Diagnostic and Interventional Summary    EKG Interpretation  Date/Time:    Ventricular Rate:    PR Interval:    QRS Duration:   QT Interval:    QTC Calculation:   R Axis:     Text Interpretation:         Labs Reviewed  CBC WITH DIFFERENTIAL/PLATELET - Abnormal; Notable for the following components:      Result Value   RBC 4.09 (*)    Hemoglobin 12.8 (*)    HCT 38.5 (*)    All other components within normal limits  BASIC METABOLIC PANEL - Abnormal; Notable for the following components:   Sodium 132 (*)    Glucose, Bld 148  (*)    Calcium 8.5 (*)    All other components within normal limits  CBG MONITORING, ED    CT Elbow Right Wo Contrast  Final Result    DG Elbow Complete Right  Final Result    DG Knee Complete 4 Views Right  Final Result    DG Pelvis Portable  Final Result    DG Chest Port 1 View  Final Result    CT HEAD WO CONTRAST (5MM)  Final Result    CT Cervical Spine Wo Contrast  Final Result      Medications  lidocaine-EPINEPHrine (XYLOCAINE W/EPI) 2 %-1:200000 (PF) injection 20 mL (20 mLs Infiltration Given by Other 03/24/22 0057)  oxyCODONE-acetaminophen (PERCOCET/ROXICET) 5-325 MG per tablet 1 tablet (1 tablet Oral Given 03/24/22 0126)     Procedures  /  Critical Care .Marland KitchenLaceration Repair  Date/Time: 03/24/2022 2:12 AM  Performed by: Montine Circle, PA-C Authorized by: Montine Circle, PA-C   Consent:    Consent obtained:  Verbal   Consent given by:  Patient   Risks, benefits, and alternatives were discussed: yes     Risks discussed:  Infection, pain, poor cosmetic result, poor wound healing and vascular damage   Alternatives discussed:  No treatment Universal protocol:    Procedure  explained and questions answered to patient or proxy's satisfaction: yes     Relevant documents present and verified: yes     Test results available: yes     Imaging studies available: yes     Required blood products, implants, devices, and special equipment available: yes     Site/side marked: yes     Immediately prior to procedure, a time out was called: yes     Patient identity confirmed:  Verbally with patient Anesthesia:    Anesthesia method:  Local infiltration   Local anesthetic:  Lidocaine 1% WITH epi Laceration details:    Location:  Face   Face location:  R eyebrow   Length (cm):  2 Pre-procedure details:    Preparation:  Patient was prepped and draped in usual sterile fashion and imaging obtained to evaluate for foreign bodies Treatment:    Area cleansed with:   Saline   Amount of cleaning:  Standard Skin repair:    Repair method:  Sutures   Suture size:  5-0   Suture material:  Prolene   Suture technique:  Figure eight   Number of sutures:  2 Approximation:    Approximation:  Close Repair type:    Repair type:  Simple Post-procedure details:    Procedure completion:  Tolerated well, no immediate complications   ED Course and Medical Decision Making  I have reviewed the triage vital signs, the nursing notes, and pertinent available records from the EMR.  Social Determinants Affecting Complexity of Care: Patient has no clinically significant social determinants affecting this chief complaint..   ED Course:    Medical Decision Making Patient here after a fall.  BIB EMS as a level 2 trauma for fall on thinners.  He was seen immediately by Dr. Francia Greaves and myself.    CT head and neck negative for acute traumatic injury.  X-ray of right elbow questionable fx.  Will check CT.    CT of elbow is negative.    X-ray of pelvis, chest, and knee are without fx.    Patient is able to stand up, but says he's not able to put enough pressure on his right arm to use the walker.  Daughter uncomfortable with him going back to independent living.  I called [facility] and they will have the patient evaluated in the AM for placement in their skilled nursing unit.  Daughter is comfortable with this.  Discussed with Dr. Dina Rich, who is agreeable with plan.  Amount and/or Complexity of Data Reviewed Labs: ordered. Radiology: ordered. ECG/medicine tests: ordered.  Risk Prescription drug management.     Consultants: None   Treatment and Plan: I considered admission due to patient's initial presentation, but after considering the examination and diagnostic results, patient will not require admission and can be discharged with outpatient follow-up.    Final Clinical Impressions(s) / ED Diagnoses     ICD-10-CM   1. Fall, initial encounter   W19.XXXA     2. Injury of head, initial encounter  S09.90XA     3. Laceration of right eyebrow, initial encounter  S01.111A       ED Discharge Orders     None         Discharge Instructions Discussed with and Provided to Patient:     Discharge Instructions      You can take '650mg'$  of Tylenol every 6 hours.  You need to have your sutures removed in 1 week.    The CT scan of your head  and neck showed no fracture or bleeding.  The x-rays of your chest, pelvis/hips, and knee showed no fracture.  The elbow x-ray showed a questionable fracture, but it was not seen on the CT scan.       Montine Circle, PA-C 03/24/22 0456    Valarie Merino, MD 03/24/22 [phone]

## 2022-03-24 ENCOUNTER — Telehealth: Payer: Self-pay | Admitting: Family Medicine

## 2022-03-24 ENCOUNTER — Emergency Department (HOSPITAL_COMMUNITY): Payer: Medicare Other

## 2022-03-24 DIAGNOSIS — R278 Other lack of coordination: Secondary | ICD-10-CM | POA: Diagnosis not present

## 2022-03-24 DIAGNOSIS — R531 Weakness: Secondary | ICD-10-CM | POA: Diagnosis not present

## 2022-03-24 DIAGNOSIS — R262 Difficulty in walking, not elsewhere classified: Secondary | ICD-10-CM | POA: Diagnosis not present

## 2022-03-24 DIAGNOSIS — R29898 Other symptoms and signs involving the musculoskeletal system: Secondary | ICD-10-CM | POA: Diagnosis not present

## 2022-03-24 DIAGNOSIS — M25561 Pain in right knee: Secondary | ICD-10-CM | POA: Diagnosis not present

## 2022-03-24 DIAGNOSIS — Z7401 Bed confinement status: Secondary | ICD-10-CM | POA: Diagnosis not present

## 2022-03-24 DIAGNOSIS — R2681 Unsteadiness on feet: Secondary | ICD-10-CM | POA: Diagnosis not present

## 2022-03-24 DIAGNOSIS — S079XXD Crushing injury of head, part unspecified, subsequent encounter: Secondary | ICD-10-CM | POA: Diagnosis not present

## 2022-03-24 DIAGNOSIS — S01111A Laceration without foreign body of right eyelid and periocular area, initial encounter: Secondary | ICD-10-CM | POA: Diagnosis not present

## 2022-03-24 MED ORDER — OXYCODONE-ACETAMINOPHEN 5-325 MG PO TABS: 2.0000 | ORAL_TABLET | Freq: Once | ORAL | Status: DC

## 2022-03-24 MED ORDER — LIDOCAINE-EPINEPHRINE (PF) 2 %-1:200000 IJ SOLN
20.0000 mL | Freq: Once | INTRAMUSCULAR | Status: AC
  Administered 2022-03-24: 20 mL
  Filled 2022-03-24: qty 20

## 2022-03-24 MED ORDER — OXYCODONE-ACETAMINOPHEN 5-325 MG PO TABS
1.0000 | ORAL_TABLET | Freq: Once | ORAL | Status: AC
  Administered 2022-03-24: 1 via ORAL
  Filled 2022-03-24: qty 1

## 2022-03-24 NOTE — ED Notes (Signed)
Report called to Miquel Dunn, Therapist, sports at [facility] [employer]

## 2022-03-24 NOTE — Telephone Encounter (Signed)
Patient faceplanted and now  [name] is handling his rehab for the injury sustained. Says paperwork was faxed over and she is asking that it be filled out asap.

## 2022-03-24 NOTE — ED Notes (Signed)
Patient brought back from CT at this time

## 2022-03-24 NOTE — Discharge Instructions (Addendum)
You can take '650mg'$  of Tylenol every 6 hours.  You need to have your sutures removed in 1 week.    The CT scan of your head and neck showed no fracture or bleeding.  The x-rays of your chest, pelvis/hips, and knee showed no fracture.  The elbow x-ray showed a questionable fracture, but it was not seen on the CT scan.

## 2022-03-24 NOTE — ED Notes (Signed)
Attempted to ambulate patient with walker, was not able to ambulate with walker. Provider notified.

## 2022-03-25 ENCOUNTER — Non-Acute Institutional Stay (SKILLED_NURSING_FACILITY): Payer: Medicare Other | Admitting: Nurse Practitioner

## 2022-03-25 ENCOUNTER — Encounter: Payer: Self-pay | Admitting: Nurse Practitioner

## 2022-03-25 DIAGNOSIS — S5001XA Contusion of right elbow, initial encounter: Secondary | ICD-10-CM | POA: Insufficient documentation

## 2022-03-25 DIAGNOSIS — E871 Hypo-osmolality and hyponatremia: Secondary | ICD-10-CM

## 2022-03-25 DIAGNOSIS — I2510 Atherosclerotic heart disease of native coronary artery without angina pectoris: Secondary | ICD-10-CM | POA: Diagnosis not present

## 2022-03-25 DIAGNOSIS — R278 Other lack of coordination: Secondary | ICD-10-CM | POA: Diagnosis not present

## 2022-03-25 DIAGNOSIS — R262 Difficulty in walking, not elsewhere classified: Secondary | ICD-10-CM | POA: Diagnosis not present

## 2022-03-25 DIAGNOSIS — M25561 Pain in right knee: Secondary | ICD-10-CM | POA: Diagnosis not present

## 2022-03-25 DIAGNOSIS — G609 Hereditary and idiopathic neuropathy, unspecified: Secondary | ICD-10-CM | POA: Diagnosis not present

## 2022-03-25 DIAGNOSIS — Z9861 Coronary angioplasty status: Secondary | ICD-10-CM | POA: Diagnosis not present

## 2022-03-25 DIAGNOSIS — I480 Paroxysmal atrial fibrillation: Secondary | ICD-10-CM | POA: Diagnosis not present

## 2022-03-25 DIAGNOSIS — I5189 Other ill-defined heart diseases: Secondary | ICD-10-CM

## 2022-03-25 DIAGNOSIS — Z86711 Personal history of pulmonary embolism: Secondary | ICD-10-CM | POA: Insufficient documentation

## 2022-03-25 DIAGNOSIS — S42401A Unspecified fracture of lower end of right humerus, initial encounter for closed fracture: Secondary | ICD-10-CM

## 2022-03-25 DIAGNOSIS — K219 Gastro-esophageal reflux disease without esophagitis: Secondary | ICD-10-CM

## 2022-03-25 DIAGNOSIS — S079XXD Crushing injury of head, part unspecified, subsequent encounter: Secondary | ICD-10-CM | POA: Diagnosis not present

## 2022-03-25 DIAGNOSIS — R29898 Other symptoms and signs involving the musculoskeletal system: Secondary | ICD-10-CM | POA: Diagnosis not present

## 2022-03-25 DIAGNOSIS — R2681 Unsteadiness on feet: Secondary | ICD-10-CM | POA: Diagnosis not present

## 2022-03-25 DIAGNOSIS — E876 Hypokalemia: Secondary | ICD-10-CM | POA: Diagnosis not present

## 2022-03-25 DIAGNOSIS — I10 Essential (primary) hypertension: Secondary | ICD-10-CM | POA: Diagnosis not present

## 2022-03-25 DIAGNOSIS — E785 Hyperlipidemia, unspecified: Secondary | ICD-10-CM

## 2022-03-25 DIAGNOSIS — S42409A Unspecified fracture of lower end of unspecified humerus, initial encounter for closed fracture: Secondary | ICD-10-CM | POA: Insufficient documentation

## 2022-03-25 DIAGNOSIS — S5001XD Contusion of right elbow, subsequent encounter: Secondary | ICD-10-CM

## 2022-03-25 NOTE — Telephone Encounter (Signed)
Pt daughter dropped off paperwork and Dr Sarajane Jews completed and signed. Pt daughter took the original copies with her, copies sent to scanning

## 2022-03-25 NOTE — Assessment & Plan Note (Signed)
takes Pantoprazole, Hgb 12.8 03/23/22

## 2022-03-25 NOTE — Assessment & Plan Note (Signed)
on Kcl K 4.1 03/23/22

## 2022-03-25 NOTE — Assessment & Plan Note (Signed)
Grade 2 diastolic dysfunction, compensated 

## 2022-03-25 NOTE — Assessment & Plan Note (Signed)
Blood pressure is controlled, continue Metoprolol. 

## 2022-03-25 NOTE — Assessment & Plan Note (Signed)
on Atorvastatin 

## 2022-03-25 NOTE — Assessment & Plan Note (Signed)
CAD ST elevation MI LAD 05/2021, underwent Cath, stenting, s/p percutaneous coronary angioplasty, started DAPT, Echo EF 40-45%,  65-70% 11/02/21, started Plavix 12/24/21 per [facility]

## 2022-03-25 NOTE — Progress Notes (Signed)
Location:   SNF [place] Room Number: NO/65/A Place of Service:  SNF (31) Provider: [facility] Fred Franzen NP  Laurey Morale, MD  Patient Care Team: Laurey Morale, MD as PCP - General Troy Sine, MD as PCP - Cardiology (Cardiology) Clent Jacks, MD as Consulting Physician (Ophthalmology) Zadie Rhine Clent Demark, MD as Consulting Physician (Ophthalmology)  Extended Emergency Contact Information Primary Emergency Contact: Derenda Fennel [place] Phone: [phone] Mobile Phone: [phone] Relation: Daughter Secondary Emergency Contact: Horatio Pel [place] Phone: [phone] Relation: Daughter  Code Status: DNR Goals of care: Advanced Directive information    11/02/2021   11:00 PM  Advanced Directives  Does Patient Have a Medical Advance Directive? Yes  Type of Advance Directive Living will  Does patient want to make changes to medical advance directive? No - Patient declined     Chief Complaint  Patient presents with   Acute Visit    Patient is being seen for right arm pain from possible fracture    HPI:  Pt is a 88 y.o. male seen today for an acute visit for medication review following ED evaluation.   ED visit 03/23/22 for fall, sustained right orbital laceration, sutures are intact-removal in 7 days,  no active bleeding or s/s of infection, the right elbow contusion, painful and swelling, x-ray ? fx. No acute findings of CT C-spine, R elbow, head, Xray chest, right knee, pelvis. Tylenol is available to him  CAD ST elevation MI LAD 05/2021, underwent Cath, stenting, s/p percutaneous coronary angioplasty, started DAPT, Echo EF 40-45%,  65-70% 11/02/21, started Plavix 12/24/21 per [facility]  PE 11/02/21, started Eliquis  Grade 2 diastolic dysfunction, compensated  Afib, on Eliquis, Metoprolol  GERD takes Pantoprazole, Hgb 12.8 03/23/22  Hypokalemia, on Kcl K 4.1 03/23/22  Hyponatremia, Na 132 03/23/22  HLD on  Atorvastatin  HTN, on Metoprolol  Peripheral neuropathy, on Gabapentin     Past Medical History:  Diagnosis Date   Arthritis    Diverticulosis of colon    External thrombosed hemorrhoids 06/11/2009   Qualifier: Diagnosis of  By: Sarajane Jews MD, Ishmael Holter    GERD (gastroesophageal reflux disease)    Hypertension    Past Surgical History:  Procedure Laterality Date   cataract extaction, right  6/09   per Dr. [name]   CATARACT EXTRACTION Left    COLONOSCOPY  09/23/04   per Dr. [name], clear, no repeats needed    CORONARY STENT INTERVENTION N/A 06/06/2021   Procedure: CORONARY STENT INTERVENTION;  Surgeon: [name], MD;  Location: [place] [facility];  Service: Cardiovascular;  Laterality: N/A;   CORONARY/GRAFT ACUTE MI REVASCULARIZATION N/A 06/06/2021   Procedure: Coronary/Graft Acute MI Revascularization;  Surgeon: [name], MD;  Location: [place] [facility];  Service: Cardiovascular;  Laterality: N/A;   EGD and esophageal diatation,  05-10-11   per Dr. [name]     HERNIA REPAIR     LEFT HEART CATH AND CORONARY ANGIOGRAPHY N/A 06/06/2021   Procedure: LEFT HEART CATH AND CORONARY ANGIOGRAPHY;  Surgeon: [name], MD;  Location: [place] [facility];  Service: Cardiovascular;  Laterality: N/A;   rt inguinal hernia     x2    Allergies  Allergen Reactions   Contrast Media [Iodinated Contrast Media] Rash    Allergies as of 03/25/2022       Reactions   Contrast Media [iodinated Contrast Media] Rash        Medication List  Accurate as of March 25, 2022 11:59 PM. If you have any questions, ask your nurse or doctor.          acetaminophen 325 MG tablet Commonly known as: TYLENOL Take 650 mg by mouth every 6 (six) hours as needed for mild pain or headache.   ascorbic acid 500 MG tablet Commonly known as: VITAMIN C Take 500 mg by mouth daily. Reported on 10/27/2015   atorvastatin 40 MG tablet Commonly known as: LIPITOR Take 1 tablet (40 mg total) by  mouth daily.   azithromycin 250 MG tablet Commonly known as: Zithromax Z-Pak As directed   clopidogrel 75 MG tablet Commonly known as: PLAVIX Take 1 tablet (75 mg total) by mouth daily. Take 2 tablets (150 mg) on the first day (12/24/21)   Eliquis 5 MG Tabs tablet Generic drug: apixaban Take 1 tablet (5 mg total) by mouth 2 (two) times daily.   gabapentin 300 MG capsule Commonly known as: NEURONTIN TAKE 1 CAPSULE BY MOUTH THREE TIMES A DAY What changed: See the new instructions.   metoprolol tartrate 25 MG tablet Commonly known as: LOPRESSOR Take 1 tablet (25 mg) by mouth in the AM, then take 0.5 tablet (12.5 mg) by mouth in the PM.   nitroGLYCERIN 0.4 MG SL tablet Commonly known as: Nitrostat Place 1 tablet (0.4 mg total) under the tongue every 5 (five) minutes as needed for chest pain.   pantoprazole 40 MG tablet Commonly known as: PROTONIX Take 1 tablet (40 mg total) by mouth daily.   potassium chloride 10 MEQ tablet Commonly known as: Klor-Con 10 Take 1 tablet (10 mEq total) by mouth daily.        Review of Systems  Constitutional:  Positive for activity change, appetite change and fatigue. Negative for fever.  HENT:  Negative for congestion and trouble swallowing.   Eyes:  Negative for visual disturbance.  Respiratory:  Negative for cough, chest tightness, shortness of breath and wheezing.   Cardiovascular:  Negative for chest pain, palpitations and leg swelling.  Gastrointestinal:  Negative for abdominal pain, constipation, nausea and vomiting.  Genitourinary:  Negative for difficulty urinating, frequency and urgency.       Incontinent of urine  Musculoskeletal:  Positive for arthralgias, gait problem and joint swelling. Negative for neck pain.  Skin:  Positive for wound.  Neurological:  Negative for speech difficulty, weakness and headaches.  Hematological:  Bruises/bleeds easily.  Psychiatric/Behavioral:  Negative for confusion and sleep disturbance. The  patient is not nervous/anxious.     Immunization History  Administered Date(s) Administered   Influenza Split 01/20/2012, 01/07/2013   Influenza Whole 02/09/2006, 01/11/2008   Influenza, High Dose Seasonal PF 12/30/2016, 12/27/2017, 01/08/2019   Influenza,inj,quad, With Preservative 12/29/2018   Influenza-Unspecified 01/17/2014, 01/14/2016, 12/30/2016   PFIZER Comirnaty(Gray Top)Covid-19 Tri-Sucrose Vaccine 05/28/2019, 03/04/2020, 09/23/2020   Pneumococcal Conjugate-13 09/23/2015   Pneumococcal Polysaccharide-23 03/13/2008   Td 12/20/2007   Unspecified SARS-COV-2 Vaccination 04/30/2019   Zoster Recombinat (Shingrix) 03/11/2020, 06/20/2020   Pertinent  Health Maintenance Due  Topic Date Due   INFLUENZA VACCINE  11/24/2021   OPHTHALMOLOGY EXAM  10/24/2023 (Originally 10/24/2018)   HEMOGLOBIN A1C  04/15/2022   FOOT EXAM  03/09/2023      11/03/2021    7:44 PM 11/04/2021    1:00 PM 11/04/2021    8:26 PM 11/05/2021   10:00 AM 03/25/2022    4:16 PM  Fall Risk  Falls in the past year?     1  Was there an injury  with Fall?     1  Fall Risk Category Calculator     3  Fall Risk Category     High  Patient Fall Risk Level High fall risk High fall risk High fall risk High fall risk High fall risk  Patient at Risk for Falls Due to     History of fall(s);Impaired balance/gait;Impaired vision  Fall risk Follow up     Falls evaluation completed   Functional Status Survey:    Vitals:   03/25/22 1606  BP: (!) 142/74  Pulse: 68  Resp: 18  Temp: 98 F (36.7 C)  SpO2: 96%  Weight: 160 lb (72.6 kg)  Height: '5\' 7"'$  (1.702 m)   Body mass index is 25.06 kg/m. Physical Exam Vitals and nursing note reviewed.  Constitutional:      Appearance: Normal appearance.  HENT:     Head: Normocephalic.     Comments: Right orbital contusion, sutures are intact, no active bleeding or s/s of infection.     Nose: Nose normal.     Mouth/Throat:     Mouth: Mucous membranes are moist.  Eyes:      Extraocular Movements: Extraocular movements intact.     Conjunctiva/sclera: Conjunctivae normal.     Pupils: Pupils are equal, round, and reactive to light.  Cardiovascular:     Rate and Rhythm: Normal rate and regular rhythm.     Heart sounds: No murmur heard. Pulmonary:     Effort: Pulmonary effort is normal.     Breath sounds: Normal breath sounds. No wheezing, rhonchi or rales.  Abdominal:     General: Bowel sounds are normal.     Palpations: Abdomen is soft.     Tenderness: There is no abdominal tenderness. There is no guarding or rebound.  Musculoskeletal:        General: Swelling, tenderness and signs of injury present.     Cervical back: Normal range of motion and neck supple.     Comments: R elbow swelling, painful, decreased ROM  Skin:    General: Skin is warm and dry.     Findings: Bruising present.     Comments: Bruising R elbow, R orbital.   Neurological:     General: No focal deficit present.     Mental Status: He is alert and oriented to person, place, and time. Mental status is at baseline.     Motor: No weakness.     Coordination: Coordination normal.     Gait: Gait abnormal.  Psychiatric:        Mood and Affect: Mood normal.        Behavior: Behavior normal.        Thought Content: Thought content normal.     Labs reviewed: Recent Labs    11/02/21 1640 11/02/21 1648 11/02/21 2130 11/03/21 0537 11/04/21 0548 03/23/22 2255  NA 132*  --  139 133*  132* 134* 132*  K 3.7  --  2.4* 4.1  4.0 3.8 4.1  CL 105  --  120* 104  104 108 101  CO2 18*  --  12* 21*  21* 19* 23  GLUCOSE 116*  --  97 155*  162* 154* 148*  BUN 18  --  '11 16  16 16 16  '$ CREATININE 0.72  --  0.36* 0.51*  0.52* 0.51* 0.77  CALCIUM 8.5*  --  5.6* 8.3*  8.3* 8.0* 8.5*  MG 1.9  --  1.3* 1.9  --   --   PHOS  --  3.3  --  4.4  --   --    Recent Labs    11/02/21 2130 11/03/21 0537 11/04/21 0548  AST 45* 61* 63*  ALT 24 39 37  ALKPHOS 52 83 74  BILITOT 1.3* 1.5* 0.8  PROT  3.7* 5.9* 5.2*  ALBUMIN 1.8* 3.1* 2.7*   Recent Labs    11/02/21 1640 11/03/21 0537 11/03/21 1737 11/04/21 0548 11/05/21 0450 03/23/22 2255  WBC 7.0 3.8*   < > 9.7 9.0 5.0  NEUTROABS 4.7 2.9  --   --   --  3.3  HGB 12.3* 12.7*   < > 11.4* 11.1* 12.8*  HCT 35.9* 37.0*   < > 33.3* 32.7* 38.5*  MCV 93.5 92.7   < > 93.0 94.0 94.1  PLT 177 163   < > 157 162 216   < > = values in this interval not displayed.   Lab Results  Component Value Date   TSH 2.039 11/02/2021   Lab Results  Component Value Date   HGBA1C 5.5 10/14/2021   Lab Results  Component Value Date   CHOL 93 10/14/2021   HDL 45.60 10/14/2021   LDLCALC 33 10/14/2021   LDLDIRECT 72.0 09/27/2017   TRIG 71.0 10/14/2021   CHOLHDL 2 10/14/2021    Significant Diagnostic Results in last 30 days:  CT Elbow Right Wo Contrast  Result Date: 03/24/2022 CLINICAL DATA:  Joint effusion and suspicious changes of fracture on recent plain film EXAM: CT OF THE UPPER RIGHT EXTREMITY WITHOUT CONTRAST TECHNIQUE: Multidetector CT imaging of the upper right extremity was performed according to the standard protocol. RADIATION DOSE REDUCTION: This exam was performed according to the departmental dose-optimization program which includes automated exposure control, adjustment of the mA and/or kV according to patient size and/or use of iterative reconstruction technique. COMPARISON:  Plain film from the previous day. FINDINGS: Bones/Joint/Cartilage Distal humerus and proximal radius and ulna are well visualized and within normal limits. The curvilinear lucency identified on the prior plain film is not borne out on this exam. No fracture or dislocation is seen. Ligaments Suboptimally assessed by CT. Muscles and Tendons Surrounding musculature is within normal limits. Soft tissues Surrounding soft tissue structures are within normal limits. No joint effusion is seen. IMPRESSION: No acute bony fracture is noted. Electronically Signed   By: Inez Catalina M.D.   On: 03/24/2022 02:30   DG Knee Complete 4 Views Right  Result Date: 03/23/2022 CLINICAL DATA:  Right knee pain after fall EXAM: RIGHT KNEE - COMPLETE 4+ VIEW COMPARISON:  None Available. FINDINGS: No definite acute fracture. No dislocation. Moderate knee joint effusion. Soft tissue swelling about the knee. Demineralization. IMPRESSION: No definite fracture.  Moderate effusion. Electronically Signed   By: Placido Sou M.D.   On: 03/23/2022 23:42   DG Pelvis Portable  Result Date: 03/23/2022 CLINICAL DATA:  Fall EXAM: PORTABLE PELVIS 1-2 VIEWS COMPARISON:  None Available. FINDINGS: There is no evidence of pelvic fracture or diastasis. No pelvic bone lesions are seen. Degenerative changes pubic symphysis, both hips, SI joints and lower lumbar spine. IMPRESSION: No acute fracture or dislocation. Electronically Signed   By: Placido Sou M.D.   On: 03/23/2022 23:40   DG Chest Port 1 View  Result Date: 03/23/2022 CLINICAL DATA:  Status post fall EXAM: PORTABLE CHEST 1 VIEW COMPARISON:  CT chest angiogram 11/03/2021 and radiographs 11/02/2021 FINDINGS: Borderline cardiomegaly. Aortic atherosclerotic calcification. No focal consolidation, pleural effusion, or pneumothorax. No acute osseous abnormality. IMPRESSION: No active disease.  Electronically Signed   By: Placido Sou M.D.   On: 03/23/2022 23:39   DG Elbow Complete Right  Result Date: 03/23/2022 CLINICAL DATA:  Right elbow pain after fall EXAM: RIGHT ELBOW - COMPLETE 3+ VIEW COMPARISON:  None Available. FINDINGS: Cortical irregularity and curvilinear lucency through the distal humerus suspicious for supracondylar fracture versus projection. Moderate elbow joint effusion. Soft tissue swelling greatest posteriorly. IMPRESSION: Suspected supracondylar fracture of the distal humerus. Consider CT for further evaluation. Electronically Signed   By: Placido Sou M.D.   On: 03/23/2022 23:37   CT HEAD WO CONTRAST (5MM)  Result  Date: 03/23/2022 CLINICAL DATA:  Un witnessed fall with headaches and neck pain, initial encounter EXAM: CT HEAD WITHOUT CONTRAST CT CERVICAL SPINE WITHOUT CONTRAST TECHNIQUE: Multidetector CT imaging of the head and cervical spine was performed following the standard protocol without intravenous contrast. Multiplanar CT image reconstructions of the cervical spine were also generated. RADIATION DOSE REDUCTION: This exam was performed according to the departmental dose-optimization program which includes automated exposure control, adjustment of the mA and/or kV according to patient size and/or use of iterative reconstruction technique. COMPARISON:  04/12/2016 FINDINGS: CT HEAD FINDINGS Brain: No evidence of acute infarction, hemorrhage, hydrocephalus, extra-axial collection or mass lesion/mass effect. Chronic atrophic and ischemic changes are noted. Vascular: No hyperdense vessel or unexpected calcification. Skull: Normal. Negative for fracture or focal lesion. Sinuses/Orbits: Left mastoid effusion is seen. Paranasal sinuses are otherwise within normal limits. Orbits and their contents are unremarkable. Other: None. CT CERVICAL SPINE FINDINGS Alignment: Loss of the normal cervical lordosis is noted. Skull base and vertebrae: 7 cervical segments are well visualized. Multilevel osteophytic change and facet hypertrophic changes seen. Multilevel disc space narrowing is noted as well. No acute fracture is seen. The odontoid is within normal limits. Soft tissues and spinal canal: No acute soft tissue abnormality is noted. Upper chest: Visualized lung fields are within normal limits. Other: None IMPRESSION: CT of the head: Chronic atrophic and ischemic changes without acute abnormality. Chronic left mastoid effusion. CT of the cervical spine: Multilevel degenerative change without acute abnormality. Electronically Signed   By: Inez Catalina M.D.   On: 03/23/2022 23:30   CT Cervical Spine Wo Contrast  Result Date:  03/23/2022 CLINICAL DATA:  Un witnessed fall with headaches and neck pain, initial encounter EXAM: CT HEAD WITHOUT CONTRAST CT CERVICAL SPINE WITHOUT CONTRAST TECHNIQUE: Multidetector CT imaging of the head and cervical spine was performed following the standard protocol without intravenous contrast. Multiplanar CT image reconstructions of the cervical spine were also generated. RADIATION DOSE REDUCTION: This exam was performed according to the departmental dose-optimization program which includes automated exposure control, adjustment of the mA and/or kV according to patient size and/or use of iterative reconstruction technique. COMPARISON:  04/12/2016 FINDINGS: CT HEAD FINDINGS Brain: No evidence of acute infarction, hemorrhage, hydrocephalus, extra-axial collection or mass lesion/mass effect. Chronic atrophic and ischemic changes are noted. Vascular: No hyperdense vessel or unexpected calcification. Skull: Normal. Negative for fracture or focal lesion. Sinuses/Orbits: Left mastoid effusion is seen. Paranasal sinuses are otherwise within normal limits. Orbits and their contents are unremarkable. Other: None. CT CERVICAL SPINE FINDINGS Alignment: Loss of the normal cervical lordosis is noted. Skull base and vertebrae: 7 cervical segments are well visualized. Multilevel osteophytic change and facet hypertrophic changes seen. Multilevel disc space narrowing is noted as well. No acute fracture is seen. The odontoid is within normal limits. Soft tissues and spinal canal: No acute soft tissue abnormality is noted. Upper chest:  Visualized lung fields are within normal limits. Other: None IMPRESSION: CT of the head: Chronic atrophic and ischemic changes without acute abnormality. Chronic left mastoid effusion. CT of the cervical spine: Multilevel degenerative change without acute abnormality. Electronically Signed   By: Inez Catalina M.D.   On: 03/23/2022 23:30    Assessment/Plan: Fracture of distal humerus ED visit  03/23/22 for fall, sustained right orbital laceration, sutures are intact-removal in 7 days,  no active bleeding or s/s of infection, the right elbow contusion, painful and swelling, x-ray ? fx. No acute findings of CT C-spine, R elbow, head, Xray chest, right knee, pelvis. Tylenol is available to him.  Therapy to evl and tx as indicated Suggest sling for right elbow May repeat X-ray if no better.  03/29/22 Xray acute comminuted fracture of the distal humerus, has ortho appointment tomorrow 03/30/22  CAD S/P percutaneous coronary angioplasty CAD ST elevation MI LAD 05/2021, underwent Cath, stenting, s/p percutaneous coronary angioplasty, started DAPT, Echo EF 40-45%,  65-70% 11/02/21, started Plavix 12/24/21 per [facility]  History of pulmonary embolism 11/02/21, started Eliquis, update CBC  Diastolic dysfunction Grade 2 diastolic dysfunction, compensated  Paroxysmal atrial fibrillation (HCC) Heart rate is in control, on Eliquis, Metoprolol  GERD takes Pantoprazole, Hgb 12.8 03/23/22  Hyponatremia Na 132 03/23/22, repeat BMP  Hypokalemia on Kcl K 4.1 03/23/22  Hyperlipidemia  on Atorvastatin  Neuropathy, peripheral Continue Gabapentin  Essential hypertension Blood pressure is controlled, continue Metoprolol.     Family/ staff Communication: plan of care reviewed with the patient and charge nurse.   Labs/tests ordered:  X-ray R elbow 3 views  Time spend 35 minutes.

## 2022-03-25 NOTE — Assessment & Plan Note (Signed)
Continue Gabapentin

## 2022-03-25 NOTE — Assessment & Plan Note (Addendum)
ED visit 03/23/22 for fall, sustained right orbital laceration, sutures are intact-removal in 7 days,  no active bleeding or s/s of infection, the right elbow contusion, painful and swelling, x-ray ? fx. No acute findings of CT C-spine, R elbow, head, Xray chest, right knee, pelvis. Tylenol is available to him.  Therapy to evl and tx as indicated Suggest sling for right elbow May repeat X-ray if no better.  03/29/22 Xray acute comminuted fracture of the distal humerus, has ortho appointment tomorrow 03/30/22

## 2022-03-25 NOTE — Assessment & Plan Note (Signed)
Na 132 03/23/22, repeat BMP

## 2022-03-25 NOTE — Assessment & Plan Note (Signed)
Heart rate is in control, on Eliquis, Metoprolol 

## 2022-03-25 NOTE — Assessment & Plan Note (Signed)
11/02/21, started Eliquis, update CBC

## 2022-03-26 ENCOUNTER — Encounter: Payer: Self-pay | Admitting: Nurse Practitioner

## 2022-03-26 DIAGNOSIS — R2681 Unsteadiness on feet: Secondary | ICD-10-CM | POA: Diagnosis not present

## 2022-03-26 DIAGNOSIS — R1312 Dysphagia, oropharyngeal phase: Secondary | ICD-10-CM | POA: Diagnosis not present

## 2022-03-26 DIAGNOSIS — M25561 Pain in right knee: Secondary | ICD-10-CM | POA: Diagnosis not present

## 2022-03-26 DIAGNOSIS — S079XXD Crushing injury of head, part unspecified, subsequent encounter: Secondary | ICD-10-CM | POA: Diagnosis not present

## 2022-03-26 DIAGNOSIS — R29898 Other symptoms and signs involving the musculoskeletal system: Secondary | ICD-10-CM | POA: Diagnosis not present

## 2022-03-29 DIAGNOSIS — R29898 Other symptoms and signs involving the musculoskeletal system: Secondary | ICD-10-CM | POA: Diagnosis not present

## 2022-03-29 DIAGNOSIS — S079XXD Crushing injury of head, part unspecified, subsequent encounter: Secondary | ICD-10-CM | POA: Diagnosis not present

## 2022-03-29 DIAGNOSIS — S42401A Unspecified fracture of lower end of right humerus, initial encounter for closed fracture: Secondary | ICD-10-CM | POA: Diagnosis not present

## 2022-03-29 DIAGNOSIS — R2681 Unsteadiness on feet: Secondary | ICD-10-CM | POA: Diagnosis not present

## 2022-03-29 DIAGNOSIS — R1312 Dysphagia, oropharyngeal phase: Secondary | ICD-10-CM | POA: Diagnosis not present

## 2022-03-29 DIAGNOSIS — M25561 Pain in right knee: Secondary | ICD-10-CM | POA: Diagnosis not present

## 2022-03-30 ENCOUNTER — Non-Acute Institutional Stay (SKILLED_NURSING_FACILITY): Payer: Medicare Other | Admitting: Family Medicine

## 2022-03-30 DIAGNOSIS — M25521 Pain in right elbow: Secondary | ICD-10-CM | POA: Diagnosis not present

## 2022-03-30 DIAGNOSIS — S079XXD Crushing injury of head, part unspecified, subsequent encounter: Secondary | ICD-10-CM | POA: Diagnosis not present

## 2022-03-30 DIAGNOSIS — Z86711 Personal history of pulmonary embolism: Secondary | ICD-10-CM | POA: Diagnosis not present

## 2022-03-30 DIAGNOSIS — S42411A Displaced simple supracondylar fracture without intercondylar fracture of right humerus, initial encounter for closed fracture: Secondary | ICD-10-CM | POA: Diagnosis not present

## 2022-03-30 DIAGNOSIS — G609 Hereditary and idiopathic neuropathy, unspecified: Secondary | ICD-10-CM

## 2022-03-30 DIAGNOSIS — R29898 Other symptoms and signs involving the musculoskeletal system: Secondary | ICD-10-CM | POA: Diagnosis not present

## 2022-03-30 DIAGNOSIS — K219 Gastro-esophageal reflux disease without esophagitis: Secondary | ICD-10-CM

## 2022-03-30 DIAGNOSIS — R1312 Dysphagia, oropharyngeal phase: Secondary | ICD-10-CM | POA: Diagnosis not present

## 2022-03-30 DIAGNOSIS — I10 Essential (primary) hypertension: Secondary | ICD-10-CM

## 2022-03-30 DIAGNOSIS — E785 Hyperlipidemia, unspecified: Secondary | ICD-10-CM

## 2022-03-30 DIAGNOSIS — R2681 Unsteadiness on feet: Secondary | ICD-10-CM | POA: Diagnosis not present

## 2022-03-30 DIAGNOSIS — M25561 Pain in right knee: Secondary | ICD-10-CM | POA: Diagnosis not present

## 2022-03-30 LAB — CBC AND DIFFERENTIAL
HCT: 37 — AB (ref 41–53)
Hemoglobin: 12.7 — AB (ref 13.5–17.5)
Platelets: 356 10*3/uL (ref 150–400)
WBC: 7.2

## 2022-03-30 LAB — BASIC METABOLIC PANEL
BUN: 13 (ref 4–21)
CO2: 26 — AB (ref 13–22)
Chloride: 98 — AB (ref 99–108)
Creatinine: 0.5 — AB (ref 0.6–1.3)
Glucose: 101
Potassium: 4.4 mEq/L (ref 3.5–5.1)
Sodium: 131 — AB (ref 137–147)

## 2022-03-30 LAB — COMPREHENSIVE METABOLIC PANEL
Calcium: 8.5 — AB (ref 8.7–10.7)
eGFR: 95

## 2022-03-30 LAB — CBC: RBC: 4.06 (ref 3.87–5.11)

## 2022-03-30 NOTE — Progress Notes (Signed)
Provider:  Alain Honey, MD Location:      Place of Service:     PCP: Laurey Morale, MD Patient Care Team: Laurey Morale, MD as PCP - General Troy Sine, MD as PCP - Cardiology (Cardiology) Clent Jacks, MD as Consulting Physician (Ophthalmology) Zadie Rhine Clent Demark, MD as Consulting Physician (Ophthalmology)  Extended Emergency Contact Information Primary Emergency Contact: Derenda Fennel [place] of [place] Phone: [phone] Mobile Phone: [phone] Relation: Daughter Secondary Emergency Contact: Horatio Pel [place] of [place] Phone: [phone] Relation: Daughter  Code Status:  Goals of Care: Advanced Directive information    11/02/2021   11:00 PM  Advanced Directives  Does Patient Have a Medical Advance Directive? Yes  Type of Advance Directive Living will  Does patient want to make changes to medical advance directive? No - Patient declined      No chief complaint on file.   HPI: Patient is a 88 y.o. male seen today for admission to [facility].  Patient is a resident here in assisted living.  He suffered a fall on 1128 and was seen in the emergency room.  Drees included laceration of the right eyebrow, closed head injury without bleed and contusion of the right elbow.  X-rays were fairly extensive including portable chest x-ray portable pelvis portable right knee, right elbow CT of the cervical spine and head and then CT of the right elbow.  Although there was a suspicion of a distal humeral fracture on plain film CT did not confirm.  He has had a subsequent x-ray here in the facility showing possible fracture and has a follow-up appointment this afternoon with orthopedics.  There is likely to be another x-ray in the orthopedic office.  He has a history of pulmonary embolism, paroxysmal atrial fibs, GERD, hyperlipidemia, idiopathic neuropathy, essential hypertension, and percutaneous coronary angioplasty.  These other  problems are stable.  Does take Eliquis as well as Plavix. Past Medical History:  Diagnosis Date   Arthritis    Diverticulosis of colon    External thrombosed hemorrhoids 06/11/2009   Qualifier: Diagnosis of  By: Sarajane Jews MD, Ishmael Holter    GERD (gastroesophageal reflux disease)    Hypertension    Past Surgical History:  Procedure Laterality Date   cataract extaction, right  6/09   per Dr. [name]   CATARACT EXTRACTION Left    COLONOSCOPY  09/23/04   per Dr. [name], clear, no repeats needed    CORONARY STENT INTERVENTION N/A 06/06/2021   Procedure: CORONARY STENT INTERVENTION;  Surgeon: [name], MD;  Location: [facility];  Service: Cardiovascular;  Laterality: N/A;   CORONARY/GRAFT ACUTE MI REVASCULARIZATION N/A 06/06/2021   Procedure: Coronary/Graft Acute MI Revascularization;  Surgeon: [name], MD;  Location: [facility];  Service: Cardiovascular;  Laterality: N/A;   EGD and esophageal diatation,  05-10-11   per Dr. [name]     HERNIA REPAIR     LEFT HEART CATH AND CORONARY ANGIOGRAPHY N/A 06/06/2021   Procedure: LEFT HEART CATH AND CORONARY ANGIOGRAPHY;  Surgeon: [name], MD;  Location: [facility];  Service: Cardiovascular;  Laterality: N/A;   rt inguinal hernia     x2    reports that he has quit smoking. His smoking use included cigarettes. He has a 7.50 pack-year smoking history. He has never used smokeless tobacco. He reports that he does not drink alcohol and does not use drugs. Social History   Socioeconomic History   Marital  status: Widowed    Spouse name: Not on file   Number of children: 3   Years of education: Not on file   Highest education level: Not on file  Occupational History   Occupation: retired  Tobacco Use   Smoking status: Former    Packs/day: 15.00    Years: 0.50    Total pack years: 7.50    Types: Cigarettes   Smokeless tobacco: Never   Tobacco comments:    smoked 18 and stopped and states he stopped 30's    Vaping Use   Vaping Use: Never used  Substance and Sexual Activity   Alcohol use: No    Alcohol/week: 0.0 standard drinks of alcohol   Drug use: No   Sexual activity: Never    Birth control/protection: Abstinence  Other Topics Concern   Not on file  Social History Narrative   Lives in studio at [facility] since 2009, was married x 57 years.    3 daughters.    Social Determinants of Health   Financial Resource Strain: Low Risk  (06/22/2021)   Overall Financial Resource Strain (CARDIA)    Difficulty of Paying Living Expenses: Not hard at all  Food Insecurity: No Food Insecurity (06/22/2021)   Hunger Vital Sign    Worried About Running Out of Food in the Last Year: Never true    Ran Out of Food in the Last Year: Never true  Transportation Needs: No Transportation Needs (06/22/2021)   PRAPARE - Hydrologist (Medical): No    Lack of Transportation (Non-Medical): No  Physical Activity: Insufficiently Active (06/22/2021)   Exercise Vital Sign    Days of Exercise per Week: 3 days    Minutes of Exercise per Session: 30 min  Stress: No Stress Concern Present (06/22/2021)   [place]    Feeling of Stress : Only a little  Social Connections: Moderately Integrated (06/22/2021)   Social Connection and Isolation Panel [NHANES]    Frequency of Communication with Friends and Family: More than three times a week    Frequency of Social Gatherings with Friends and Family: More than three times a week    Attends Religious Services: 1 to 4 times per year    Active Member of [employer] or Organizations: No    Attends Archivist Meetings: 1 to 4 times per year    Marital Status: Widowed  Intimate Partner Violence: Not At Risk (06/22/2021)   Humiliation, Afraid, Rape, and Kick questionnaire    Fear of Current or Ex-Partner: No    Emotionally Abused: No    Physically Abused: No    Sexually  Abused: No    Functional Status Survey:    Family History  Problem Relation Age of Onset   Coronary artery disease Father    Colon cancer Neg Hx    Stomach cancer Neg Hx    Esophageal cancer Neg Hx     Health Maintenance  Topic Date Due   DTaP/Tdap/Td (2 - Tdap) 12/19/2017   INFLUENZA VACCINE  11/24/2021   COVID-19 Vaccine (5 - 2023-24 season) 12/25/2021   OPHTHALMOLOGY EXAM  10/24/2023 (Originally 10/24/2018)   HEMOGLOBIN A1C  04/15/2022   Medicare Annual Wellness (AWV)  06/22/2022   FOOT EXAM  03/09/2023   Pneumonia Vaccine 22+ Years old  Completed   Zoster Vaccines- Shingrix  Completed   HPV VACCINES  Aged Out    Allergies  Allergen  Reactions   Contrast Media [Iodinated Contrast Media] Rash    Outpatient Encounter Medications as of 03/30/2022  Medication Sig   acetaminophen (TYLENOL) 325 MG tablet Take 650 mg by mouth every 6 (six) hours as needed for mild pain or headache.   apixaban (ELIQUIS) 5 MG TABS tablet Take 1 tablet (5 mg total) by mouth 2 (two) times daily.   atorvastatin (LIPITOR) 40 MG tablet Take 1 tablet (40 mg total) by mouth daily.   azithromycin (ZITHROMAX Z-PAK) 250 MG tablet As directed   clopidogrel (PLAVIX) 75 MG tablet Take 1 tablet (75 mg total) by mouth daily. Take 2 tablets (150 mg) on the first day (12/24/21)   gabapentin (NEURONTIN) 300 MG capsule TAKE 1 CAPSULE BY MOUTH THREE TIMES A DAY (Patient taking differently: Take 300 mg by mouth 3 (three) times daily.)   metoprolol tartrate (LOPRESSOR) 25 MG tablet Take 1 tablet (25 mg) by mouth in the AM, then take 0.5 tablet (12.5 mg) by mouth in the PM.   nitroGLYCERIN (NITROSTAT) 0.4 MG SL tablet Place 1 tablet (0.4 mg total) under the tongue every 5 (five) minutes as needed for chest pain.   pantoprazole (PROTONIX) 40 MG tablet Take 1 tablet (40 mg total) by mouth daily.   potassium chloride (KLOR-CON 10) 10 MEQ tablet Take 1 tablet (10 mEq total) by mouth daily.   vitamin C (ASCORBIC ACID) 500 MG  tablet Take 500 mg by mouth daily. Reported on 10/27/2015   No facility-administered encounter medications on file as of 03/30/2022.    Review of Systems  Constitutional:  Positive for activity change.  HENT: Negative.    Respiratory: Negative.    Cardiovascular: Negative.   Genitourinary: Negative.   Musculoskeletal:  Positive for gait problem.  Skin: Negative.   Hematological:  Bruises/bleeds easily.  Psychiatric/Behavioral:  Positive for confusion.   All other systems reviewed and are negative.   There were no vitals filed for this visit. There is no height or weight on file to calculate BMI. Physical Exam Vitals and nursing note reviewed.  Constitutional:      Comments: Has localized bruising and swelling around the right upper eyebrow  HENT:     Head: Normocephalic.     Mouth/Throat:     Mouth: Mucous membranes are moist.     Pharynx: Oropharynx is clear.  Eyes:     Extraocular Movements: Extraocular movements intact.     Conjunctiva/sclera: Conjunctivae normal.     Pupils: Pupils are equal, round, and reactive to light.  Cardiovascular:     Rate and Rhythm: Normal rate.  Pulmonary:     Effort: Pulmonary effort is normal.     Breath sounds: Normal breath sounds.  Abdominal:     General: Bowel sounds are normal.     Palpations: Abdomen is soft.  Musculoskeletal:     Cervical back: Normal range of motion.     Comments: He uses wheelchair for ambulation right upper extremity: There is marked effusion in the right elbow.  He is unable to fully extend pronate or supinate.  There is generalized tenderness.  Neurological:     General: No focal deficit present.     Mental Status: He is oriented to person, place, and time.  Psychiatric:        Mood and Affect: Mood normal.        Behavior: Behavior normal.        Thought Content: Thought content normal.     Labs reviewed: Basic Metabolic Panel:  Recent Labs    11/02/21 1640 11/02/21 1648 11/02/21 2130  11/03/21 0537 11/04/21 0548 03/23/22 2255  NA 132*  --  139 133*  132* 134* 132*  K 3.7  --  2.4* 4.1  4.0 3.8 4.1  CL 105  --  120* 104  104 108 101  CO2 18*  --  12* 21*  21* 19* 23  GLUCOSE 116*  --  97 155*  162* 154* 148*  BUN 18  --  '11 16  16 16 16  '$ CREATININE 0.72  --  0.36* 0.51*  0.52* 0.51* 0.77  CALCIUM 8.5*  --  5.6* 8.3*  8.3* 8.0* 8.5*  MG 1.9  --  1.3* 1.9  --   --   PHOS  --  3.3  --  4.4  --   --    Liver Function Tests: Recent Labs    11/02/21 2130 11/03/21 0537 11/04/21 0548  AST 45* 61* 63*  ALT 24 39 37  ALKPHOS 52 83 74  BILITOT 1.3* 1.5* 0.8  PROT 3.7* 5.9* 5.2*  ALBUMIN 1.8* 3.1* 2.7*   No results for input(s): "LIPASE", "AMYLASE" in the last 8760 hours. No results for input(s): "AMMONIA" in the last 8760 hours. CBC: Recent Labs    11/02/21 1640 11/03/21 0537 11/03/21 1737 11/04/21 0548 11/05/21 0450 03/23/22 2255  WBC 7.0 3.8*   < > 9.7 9.0 5.0  NEUTROABS 4.7 2.9  --   --   --  3.3  HGB 12.3* 12.7*   < > 11.4* 11.1* 12.8*  HCT 35.9* 37.0*   < > 33.3* 32.7* 38.5*  MCV 93.5 92.7   < > 93.0 94.0 94.1  PLT 177 163   < > 157 162 216   < > = values in this interval not displayed.   Cardiac Enzymes: Recent Labs    11/02/21 1855 11/03/21 0537  CKTOTAL 495* 382   BNP: Invalid input(s): "POCBNP" Lab Results  Component Value Date   HGBA1C 5.5 10/14/2021   Lab Results  Component Value Date   TSH 2.039 11/02/2021   Lab Results  Component Value Date   PPIRJJOA41 660 02/28/2020   No results found for: "FOLATE" Lab Results  Component Value Date   FERRITIN 256 11/03/2021    Imaging and Procedures obtained prior to SNF admission: CT Elbow Right Wo Contrast  Result Date: 03/24/2022 CLINICAL DATA:  Joint effusion and suspicious changes of fracture on recent plain film EXAM: CT OF THE UPPER RIGHT EXTREMITY WITHOUT CONTRAST TECHNIQUE: Multidetector CT imaging of the upper right extremity was performed according to the standard  protocol. RADIATION DOSE REDUCTION: This exam was performed according to the departmental dose-optimization program which includes automated exposure control, adjustment of the mA and/or kV according to patient size and/or use of iterative reconstruction technique. COMPARISON:  Plain film from the previous day. FINDINGS: Bones/Joint/Cartilage Distal humerus and proximal radius and ulna are well visualized and within normal limits. The curvilinear lucency identified on the prior plain film is not borne out on this exam. No fracture or dislocation is seen. Ligaments Suboptimally assessed by CT. Muscles and Tendons Surrounding musculature is within normal limits. Soft tissues Surrounding soft tissue structures are within normal limits. No joint effusion is seen. IMPRESSION: No acute bony fracture is noted. Electronically Signed   By: Inez Catalina M.D.   On: 03/24/2022 02:30   DG Knee Complete 4 Views Right  Result Date: 03/23/2022 CLINICAL DATA:  Right knee pain after fall EXAM:  RIGHT KNEE - COMPLETE 4+ VIEW COMPARISON:  None Available. FINDINGS: No definite acute fracture. No dislocation. Moderate knee joint effusion. Soft tissue swelling about the knee. Demineralization. IMPRESSION: No definite fracture.  Moderate effusion. Electronically Signed   By: Placido Sou M.D.   On: 03/23/2022 23:42   DG Pelvis Portable  Result Date: 03/23/2022 CLINICAL DATA:  Fall EXAM: PORTABLE PELVIS 1-2 VIEWS COMPARISON:  None Available. FINDINGS: There is no evidence of pelvic fracture or diastasis. No pelvic bone lesions are seen. Degenerative changes pubic symphysis, both hips, SI joints and lower lumbar spine. IMPRESSION: No acute fracture or dislocation. Electronically Signed   By: Placido Sou M.D.   On: 03/23/2022 23:40   DG Chest Port 1 View  Result Date: 03/23/2022 CLINICAL DATA:  Status post fall EXAM: PORTABLE CHEST 1 VIEW COMPARISON:  CT chest angiogram 11/03/2021 and radiographs 11/02/2021 FINDINGS:  Borderline cardiomegaly. Aortic atherosclerotic calcification. No focal consolidation, pleural effusion, or pneumothorax. No acute osseous abnormality. IMPRESSION: No active disease. Electronically Signed   By: Placido Sou M.D.   On: 03/23/2022 23:39   DG Elbow Complete Right  Result Date: 03/23/2022 CLINICAL DATA:  Right elbow pain after fall EXAM: RIGHT ELBOW - COMPLETE 3+ VIEW COMPARISON:  None Available. FINDINGS: Cortical irregularity and curvilinear lucency through the distal humerus suspicious for supracondylar fracture versus projection. Moderate elbow joint effusion. Soft tissue swelling greatest posteriorly. IMPRESSION: Suspected supracondylar fracture of the distal humerus. Consider CT for further evaluation. Electronically Signed   By: Placido Sou M.D.   On: 03/23/2022 23:37   CT HEAD WO CONTRAST (5MM)  Result Date: 03/23/2022 CLINICAL DATA:  Un witnessed fall with headaches and neck pain, initial encounter EXAM: CT HEAD WITHOUT CONTRAST CT CERVICAL SPINE WITHOUT CONTRAST TECHNIQUE: Multidetector CT imaging of the head and cervical spine was performed following the standard protocol without intravenous contrast. Multiplanar CT image reconstructions of the cervical spine were also generated. RADIATION DOSE REDUCTION: This exam was performed according to the departmental dose-optimization program which includes automated exposure control, adjustment of the mA and/or kV according to patient size and/or use of iterative reconstruction technique. COMPARISON:  04/12/2016 FINDINGS: CT HEAD FINDINGS Brain: No evidence of acute infarction, hemorrhage, hydrocephalus, extra-axial collection or mass lesion/mass effect. Chronic atrophic and ischemic changes are noted. Vascular: No hyperdense vessel or unexpected calcification. Skull: Normal. Negative for fracture or focal lesion. Sinuses/Orbits: Left mastoid effusion is seen. Paranasal sinuses are otherwise within normal limits. Orbits and their  contents are unremarkable. Other: None. CT CERVICAL SPINE FINDINGS Alignment: Loss of the normal cervical lordosis is noted. Skull base and vertebrae: 7 cervical segments are well visualized. Multilevel osteophytic change and facet hypertrophic changes seen. Multilevel disc space narrowing is noted as well. No acute fracture is seen. The odontoid is within normal limits. Soft tissues and spinal canal: No acute soft tissue abnormality is noted. Upper chest: Visualized lung fields are within normal limits. Other: None IMPRESSION: CT of the head: Chronic atrophic and ischemic changes without acute abnormality. Chronic left mastoid effusion. CT of the cervical spine: Multilevel degenerative change without acute abnormality. Electronically Signed   By: Inez Catalina M.D.   On: 03/23/2022 23:30   CT Cervical Spine Wo Contrast  Result Date: 03/23/2022 CLINICAL DATA:  Un witnessed fall with headaches and neck pain, initial encounter EXAM: CT HEAD WITHOUT CONTRAST CT CERVICAL SPINE WITHOUT CONTRAST TECHNIQUE: Multidetector CT imaging of the head and cervical spine was performed following the standard protocol without intravenous contrast. Multiplanar CT  image reconstructions of the cervical spine were also generated. RADIATION DOSE REDUCTION: This exam was performed according to the departmental dose-optimization program which includes automated exposure control, adjustment of the mA and/or kV according to patient size and/or use of iterative reconstruction technique. COMPARISON:  04/12/2016 FINDINGS: CT HEAD FINDINGS Brain: No evidence of acute infarction, hemorrhage, hydrocephalus, extra-axial collection or mass lesion/mass effect. Chronic atrophic and ischemic changes are noted. Vascular: No hyperdense vessel or unexpected calcification. Skull: Normal. Negative for fracture or focal lesion. Sinuses/Orbits: Left mastoid effusion is seen. Paranasal sinuses are otherwise within normal limits. Orbits and their contents  are unremarkable. Other: None. CT CERVICAL SPINE FINDINGS Alignment: Loss of the normal cervical lordosis is noted. Skull base and vertebrae: 7 cervical segments are well visualized. Multilevel osteophytic change and facet hypertrophic changes seen. Multilevel disc space narrowing is noted as well. No acute fracture is seen. The odontoid is within normal limits. Soft tissues and spinal canal: No acute soft tissue abnormality is noted. Upper chest: Visualized lung fields are within normal limits. Other: None IMPRESSION: CT of the head: Chronic atrophic and ischemic changes without acute abnormality. Chronic left mastoid effusion. CT of the cervical spine: Multilevel degenerative change without acute abnormality. Electronically Signed   By: Inez Catalina M.D.   On: 03/23/2022 23:30    Assessment/Plan 1. Essential hypertension Blood pressure controlled on metoprolol.  Suspect he also is on metoprolol for paroxysmal atrial fibs  2. Gastroesophageal reflux disease, unspecified whether esophagitis present He takes pantoprazole and symptoms are controlled  3. History of pulmonary embolism Patient is on Eliquis for PE  4. Hyperlipidemia, unspecified hyperlipidemia type On atorvastatin.  Lipid panel from 1 year ago shows LDL at 71  5. Idiopathic peripheral neuropathy On gabapentin.  Symptoms are managed    Family/ staff Communication:   Labs/tests ordered:  Lillette Boxer. Sabra Heck, [place] [address] [place], [place] Office [phone]

## 2022-03-31 ENCOUNTER — Non-Acute Institutional Stay (SKILLED_NURSING_FACILITY): Payer: Medicare Other | Admitting: Adult Health

## 2022-03-31 ENCOUNTER — Encounter: Payer: Self-pay | Admitting: Adult Health

## 2022-03-31 DIAGNOSIS — Z86711 Personal history of pulmonary embolism: Secondary | ICD-10-CM

## 2022-03-31 DIAGNOSIS — K5901 Slow transit constipation: Secondary | ICD-10-CM

## 2022-03-31 DIAGNOSIS — R29898 Other symptoms and signs involving the musculoskeletal system: Secondary | ICD-10-CM | POA: Diagnosis not present

## 2022-03-31 DIAGNOSIS — S42401S Unspecified fracture of lower end of right humerus, sequela: Secondary | ICD-10-CM

## 2022-03-31 DIAGNOSIS — S079XXD Crushing injury of head, part unspecified, subsequent encounter: Secondary | ICD-10-CM | POA: Diagnosis not present

## 2022-03-31 DIAGNOSIS — R2681 Unsteadiness on feet: Secondary | ICD-10-CM | POA: Diagnosis not present

## 2022-03-31 DIAGNOSIS — R1312 Dysphagia, oropharyngeal phase: Secondary | ICD-10-CM | POA: Diagnosis not present

## 2022-03-31 DIAGNOSIS — M25561 Pain in right knee: Secondary | ICD-10-CM | POA: Diagnosis not present

## 2022-03-31 MED ORDER — TRAMADOL HCL 50 MG PO TABS: 50.0000 mg | ORAL_TABLET | Freq: Every day | ORAL | 0 refills | Status: DC

## 2022-03-31 MED ORDER — TRAMADOL HCL 50 MG PO TABS: 50.0000 mg | ORAL_TABLET | Freq: Two times a day (BID) | ORAL | 0 refills | Status: DC | PRN

## 2022-03-31 NOTE — Progress Notes (Unsigned)
Location:  [facility] Room Number: NO/65/A Place of Service:  SNF (31) Provider:  Nickola Major, NP  Patient Care Team: Laurey Morale, MD as PCP - General Troy Sine, MD as PCP - Cardiology (Cardiology) Clent Jacks, MD as Consulting Physician (Ophthalmology) Zadie Rhine Clent Demark, MD as Consulting Physician (Ophthalmology)  Extended Emergency Contact Information Primary Emergency Contact: Derenda Fennel [place] of [place] Phone: [phone] Mobile Phone: [phone] Relation: Daughter Secondary Emergency Contact: Horatio Pel [place] of [place] Phone: [phone] Relation: Daughter  Code Status:  DNR Goals of care: Advanced Directive information    03/31/2022    2:34 PM  Advanced Directives  Does Patient Have a Medical Advance Directive? Yes  Type of Paramedic of [place];Living will  Does patient want to make changes to medical advance directive? No - Patient declined  Copy of [place] in Chart? Yes - validated most recent copy scanned in chart (See row information)     Chief Complaint  Patient presents with   Acute Visit    Acute Visit with provider at [facility] on site for right arm pain   Quality Metric Gaps    Needs to Discuss COVID 19 Vaccine, TDAP & Flu Shot. NCIR verified.      HPI:  Pt is a 88 y.o. male seen today for an acute visit for Right arm pain   Past Medical History:  Diagnosis Date   Arthritis    Diverticulosis of colon    External thrombosed hemorrhoids 06/11/2009   Qualifier: Diagnosis of  By: Sarajane Jews MD, Ishmael Holter    GERD (gastroesophageal reflux disease)    Hypertension    Past Surgical History:  Procedure Laterality Date   cataract extaction, right  6/09   per Dr. [name]   CATARACT EXTRACTION Left    COLONOSCOPY  09/23/04   per Dr. [name], clear, no repeats needed    CORONARY STENT INTERVENTION N/A 06/06/2021    Procedure: CORONARY STENT INTERVENTION;  Surgeon: [name], MD;  Location: [facility];  Service: Cardiovascular;  Laterality: N/A;   CORONARY/GRAFT ACUTE MI REVASCULARIZATION N/A 06/06/2021   Procedure: Coronary/Graft Acute MI Revascularization;  Surgeon: [name], MD;  Location: [facility];  Service: Cardiovascular;  Laterality: N/A;   EGD and esophageal diatation,  05-10-11   per Dr. [name]     HERNIA REPAIR     LEFT HEART CATH AND CORONARY ANGIOGRAPHY N/A 06/06/2021   Procedure: LEFT HEART CATH AND CORONARY ANGIOGRAPHY;  Surgeon: [name], MD;  Location: [facility];  Service: Cardiovascular;  Laterality: N/A;   rt inguinal hernia     x2    Allergies  Allergen Reactions   Contrast Media [Iodinated Contrast Media] Rash    Outpatient Encounter Medications as of 03/31/2022  Medication Sig   acetaminophen (TYLENOL) 325 MG tablet Take 650 mg by mouth every 6 (six) hours as needed for mild pain or headache.   apixaban (ELIQUIS) 5 MG TABS tablet Take 1 tablet (5 mg total) by mouth 2 (two) times daily.   atorvastatin (LIPITOR) 40 MG tablet Take 1 tablet (40 mg total) by mouth daily.   clopidogrel (PLAVIX) 75 MG tablet Take 1 tablet (75 mg total) by mouth daily. Take 2 tablets (150 mg) on the first day (12/24/21)   docusate sodium (COLACE) 100 MG capsule Take 100 mg by mouth 2 (two) times daily.   gabapentin (NEURONTIN) 300 MG  capsule TAKE 1 CAPSULE BY MOUTH THREE TIMES A DAY   guaiFENesin (MUCINEX) 600 MG 12 hr tablet Take 1,200 mg by mouth 2 (two) times daily as needed for cough.   guaiFENesin (ROBITUSSIN) 100 MG/5ML liquid Take 5 mLs by mouth every 4 (four) hours as needed for cough or to loosen phlegm.   metoprolol tartrate (LOPRESSOR) 25 MG tablet Take 1 tablet (25 mg) by mouth in the AM, then take 0.5 tablet (12.5 mg) by mouth in the PM.   nitroGLYCERIN (NITROSTAT) 0.4 MG SL tablet Place 1 tablet (0.4 mg total) under the tongue every 5 (five)  minutes as needed for chest pain.   pantoprazole (PROTONIX) 40 MG tablet Take 1 tablet (40 mg total) by mouth daily.   potassium chloride (KLOR-CON 10) 10 MEQ tablet Take 1 tablet (10 mEq total) by mouth daily.   vitamin C (ASCORBIC ACID) 500 MG tablet Take 500 mg by mouth daily. Reported on 10/27/2015   [DISCONTINUED] azithromycin (ZITHROMAX Z-PAK) 250 MG tablet As directed   No facility-administered encounter medications on file as of 03/31/2022.    Review of Systems  Immunization History  Administered Date(s) Administered   Influenza Split 01/20/2012, 01/07/2013   Influenza Whole 02/09/2006, 01/11/2008   Influenza, High Dose Seasonal PF 12/30/2016, 12/27/2017, 01/08/2019   Influenza,inj,quad, With Preservative 12/29/2018   Influenza-Unspecified 01/17/2014, 01/14/2016, 12/30/2016   PFIZER Comirnaty(Gray Top)Covid-19 Tri-Sucrose Vaccine 05/28/2019, 03/04/2020, 09/23/2020   Pneumococcal Conjugate-13 09/23/2015   Pneumococcal Polysaccharide-23 03/13/2008   Td 12/20/2007   Unspecified SARS-COV-2 Vaccination 04/30/2019   Zoster Recombinat (Shingrix) 03/11/2020, 06/20/2020   Pertinent  Health Maintenance Due  Topic Date Due   INFLUENZA VACCINE  11/24/2021   OPHTHALMOLOGY EXAM  10/24/2023 (Originally 10/24/2018)   HEMOGLOBIN A1C  04/15/2022   FOOT EXAM  03/09/2023      11/04/2021    1:00 PM 11/04/2021    8:26 PM 11/05/2021   10:00 AM 03/25/2022    4:16 PM 03/31/2022    2:32 PM  Fall Risk  Falls in the past year?    1 1  Was there an injury with Fall?    1 1  Fall Risk Category Calculator    3 3  Fall Risk Category    High High  Patient Fall Risk Level High fall risk High fall risk High fall risk High fall risk High fall risk  Patient at Risk for Falls Due to    History of fall(s);Impaired balance/gait;Impaired vision History of fall(s);Impaired balance/gait;Impaired vision  Fall risk Follow up    Falls evaluation completed Falls evaluation completed   Functional Status Survey:     Vitals:   03/31/22 1425  BP: (!) 156/90  Pulse: 71  Resp: 18  Temp: 97.9 F (36.6 C)  SpO2: 97%  Weight: 155 lb 9 oz (70.6 kg)  Height: '5\' 7"'$  (1.702 m)   Body mass index is 24.36 kg/m. Physical Exam  Labs reviewed: Recent Labs    11/02/21 1640 11/02/21 1648 11/02/21 2130 11/03/21 0537 11/04/21 0548 03/23/22 2255 03/30/22 0000  NA 132*  --  139 133*  132* 134* 132* 131*  K 3.7  --  2.4* 4.1  4.0 3.8 4.1 4.4  CL 105  --  120* 104  104 108 101 98*  CO2 18*  --  12* 21*  21* 19* 23 26*  GLUCOSE 116*  --  97 155*  162* 154* 148*  --   BUN 18  --  '11 16  16 16 16 '$ 13  CREATININE 0.72  --  0.36* 0.51*  0.52* 0.51* 0.77 0.5*  CALCIUM 8.5*  --  5.6* 8.3*  8.3* 8.0* 8.5* 8.5*  MG 1.9  --  1.3* 1.9  --   --   --   PHOS  --  3.3  --  4.4  --   --   --    Recent Labs    11/02/21 2130 11/03/21 0537 11/04/21 0548  AST 45* 61* 63*  ALT 24 39 37  ALKPHOS 52 83 74  BILITOT 1.3* 1.5* 0.8  PROT 3.7* 5.9* 5.2*  ALBUMIN 1.8* 3.1* 2.7*   Recent Labs    11/02/21 1640 11/03/21 0537 11/03/21 1737 11/04/21 0548 11/05/21 0450 03/23/22 2255 03/30/22 0000  WBC 7.0 3.8*   < > 9.7 9.0 5.0 7.2  NEUTROABS 4.7 2.9  --   --   --  3.3  --   HGB 12.3* 12.7*   < > 11.4* 11.1* 12.8* 12.7*  HCT 35.9* 37.0*   < > 33.3* 32.7* 38.5* 37*  MCV 93.5 92.7   < > 93.0 94.0 94.1  --   PLT 177 163   < > 157 162 216 356   < > = values in this interval not displayed.   Lab Results  Component Value Date   TSH 2.039 11/02/2021   Lab Results  Component Value Date   HGBA1C 5.5 10/14/2021   Lab Results  Component Value Date   CHOL 93 10/14/2021   HDL 45.60 10/14/2021   LDLCALC 33 10/14/2021   LDLDIRECT 72.0 09/27/2017   TRIG 71.0 10/14/2021   CHOLHDL 2 10/14/2021    Significant Diagnostic Results in last 30 days:  CT Elbow Right Wo Contrast  Result Date: 03/24/2022 CLINICAL DATA:  Joint effusion and suspicious changes of fracture on recent plain film EXAM: CT OF THE UPPER RIGHT  EXTREMITY WITHOUT CONTRAST TECHNIQUE: Multidetector CT imaging of the upper right extremity was performed according to the standard protocol. RADIATION DOSE REDUCTION: This exam was performed according to the departmental dose-optimization program which includes automated exposure control, adjustment of the mA and/or kV according to patient size and/or use of iterative reconstruction technique. COMPARISON:  Plain film from the previous day. FINDINGS: Bones/Joint/Cartilage Distal humerus and proximal radius and ulna are well visualized and within normal limits. The curvilinear lucency identified on the prior plain film is not borne out on this exam. No fracture or dislocation is seen. Ligaments Suboptimally assessed by CT. Muscles and Tendons Surrounding musculature is within normal limits. Soft tissues Surrounding soft tissue structures are within normal limits. No joint effusion is seen. IMPRESSION: No acute bony fracture is noted. Electronically Signed   By: Inez Catalina M.D.   On: 03/24/2022 02:30   DG Knee Complete 4 Views Right  Result Date: 03/23/2022 CLINICAL DATA:  Right knee pain after fall EXAM: RIGHT KNEE - COMPLETE 4+ VIEW COMPARISON:  None Available. FINDINGS: No definite acute fracture. No dislocation. Moderate knee joint effusion. Soft tissue swelling about the knee. Demineralization. IMPRESSION: No definite fracture.  Moderate effusion. Electronically Signed   By: Placido Sou M.D.   On: 03/23/2022 23:42   DG Pelvis Portable  Result Date: 03/23/2022 CLINICAL DATA:  Fall EXAM: PORTABLE PELVIS 1-2 VIEWS COMPARISON:  None Available. FINDINGS: There is no evidence of pelvic fracture or diastasis. No pelvic bone lesions are seen. Degenerative changes pubic symphysis, both hips, SI joints and lower lumbar spine. IMPRESSION: No acute fracture or dislocation. Electronically Signed   By:  Placido Sou M.D.   On: 03/23/2022 23:40   DG Chest Port 1 View  Result Date: 03/23/2022 CLINICAL  DATA:  Status post fall EXAM: PORTABLE CHEST 1 VIEW COMPARISON:  CT chest angiogram 11/03/2021 and radiographs 11/02/2021 FINDINGS: Borderline cardiomegaly. Aortic atherosclerotic calcification. No focal consolidation, pleural effusion, or pneumothorax. No acute osseous abnormality. IMPRESSION: No active disease. Electronically Signed   By: Placido Sou M.D.   On: 03/23/2022 23:39   DG Elbow Complete Right  Result Date: 03/23/2022 CLINICAL DATA:  Right elbow pain after fall EXAM: RIGHT ELBOW - COMPLETE 3+ VIEW COMPARISON:  None Available. FINDINGS: Cortical irregularity and curvilinear lucency through the distal humerus suspicious for supracondylar fracture versus projection. Moderate elbow joint effusion. Soft tissue swelling greatest posteriorly. IMPRESSION: Suspected supracondylar fracture of the distal humerus. Consider CT for further evaluation. Electronically Signed   By: Placido Sou M.D.   On: 03/23/2022 23:37   CT HEAD WO CONTRAST (5MM)  Result Date: 03/23/2022 CLINICAL DATA:  Un witnessed fall with headaches and neck pain, initial encounter EXAM: CT HEAD WITHOUT CONTRAST CT CERVICAL SPINE WITHOUT CONTRAST TECHNIQUE: Multidetector CT imaging of the head and cervical spine was performed following the standard protocol without intravenous contrast. Multiplanar CT image reconstructions of the cervical spine were also generated. RADIATION DOSE REDUCTION: This exam was performed according to the departmental dose-optimization program which includes automated exposure control, adjustment of the mA and/or kV according to patient size and/or use of iterative reconstruction technique. COMPARISON:  04/12/2016 FINDINGS: CT HEAD FINDINGS Brain: No evidence of acute infarction, hemorrhage, hydrocephalus, extra-axial collection or mass lesion/mass effect. Chronic atrophic and ischemic changes are noted. Vascular: No hyperdense vessel or unexpected calcification. Skull: Normal. Negative for fracture or  focal lesion. Sinuses/Orbits: Left mastoid effusion is seen. Paranasal sinuses are otherwise within normal limits. Orbits and their contents are unremarkable. Other: None. CT CERVICAL SPINE FINDINGS Alignment: Loss of the normal cervical lordosis is noted. Skull base and vertebrae: 7 cervical segments are well visualized. Multilevel osteophytic change and facet hypertrophic changes seen. Multilevel disc space narrowing is noted as well. No acute fracture is seen. The odontoid is within normal limits. Soft tissues and spinal canal: No acute soft tissue abnormality is noted. Upper chest: Visualized lung fields are within normal limits. Other: None IMPRESSION: CT of the head: Chronic atrophic and ischemic changes without acute abnormality. Chronic left mastoid effusion. CT of the cervical spine: Multilevel degenerative change without acute abnormality. Electronically Signed   By: Inez Catalina M.D.   On: 03/23/2022 23:30   CT Cervical Spine Wo Contrast  Result Date: 03/23/2022 CLINICAL DATA:  Un witnessed fall with headaches and neck pain, initial encounter EXAM: CT HEAD WITHOUT CONTRAST CT CERVICAL SPINE WITHOUT CONTRAST TECHNIQUE: Multidetector CT imaging of the head and cervical spine was performed following the standard protocol without intravenous contrast. Multiplanar CT image reconstructions of the cervical spine were also generated. RADIATION DOSE REDUCTION: This exam was performed according to the departmental dose-optimization program which includes automated exposure control, adjustment of the mA and/or kV according to patient size and/or use of iterative reconstruction technique. COMPARISON:  04/12/2016 FINDINGS: CT HEAD FINDINGS Brain: No evidence of acute infarction, hemorrhage, hydrocephalus, extra-axial collection or mass lesion/mass effect. Chronic atrophic and ischemic changes are noted. Vascular: No hyperdense vessel or unexpected calcification. Skull: Normal. Negative for fracture or focal  lesion. Sinuses/Orbits: Left mastoid effusion is seen. Paranasal sinuses are otherwise within normal limits. Orbits and their contents are unremarkable. Other: None. CT CERVICAL  SPINE FINDINGS Alignment: Loss of the normal cervical lordosis is noted. Skull base and vertebrae: 7 cervical segments are well visualized. Multilevel osteophytic change and facet hypertrophic changes seen. Multilevel disc space narrowing is noted as well. No acute fracture is seen. The odontoid is within normal limits. Soft tissues and spinal canal: No acute soft tissue abnormality is noted. Upper chest: Visualized lung fields are within normal limits. Other: None IMPRESSION: CT of the head: Chronic atrophic and ischemic changes without acute abnormality. Chronic left mastoid effusion. CT of the cervical spine: Multilevel degenerative change without acute abnormality. Electronically Signed   By: Inez Catalina M.D.   On: 03/23/2022 23:30    Assessment/Plan There are no diagnoses linked to this encounter.   Family/ staff Communication: ***  Labs/tests ordered:  ***

## 2022-03-31 NOTE — Progress Notes (Unsigned)
[facility]  Provider:  Durenda Age DNP  Code Status:  Full Code  Goals of Care:     03/31/2022    2:34 PM  Advanced Directives  Does Patient Have a Medical Advance Directive? Yes  Type of Paramedic of [place];Living will  Does patient want to make changes to medical advance directive? No - Patient declined  Copy of [place] in Chart? Yes - validated most recent copy scanned in chart (See row information)     No chief complaint on file.   HPI: Patient is a 88 y.o. male seen today for an acute visit for pain management. He had a fall on 03/23/22 and was sent to ED. CT head and neck negative for acute traumatic injury.  X-ray of right elbow questionable fracture.  CT of the elbow negative for fracture.  X-ray of the pelvis, chest and knee were negative for fracture.He sustained laceration of the right outer eyebrow.   He was seen by orthopedics yesterday and came back to SNF with RUE on cast. He complains of severe pain on RUE, 7-8/10 pain. Daughter and girlfriend at bedside. Right hand with 3+edema. Daughter said that edema is a lot better compared to yesterday.  He has a PMH of pulmonary embolism takes Eliquis, paroxysmal atrial fibrillation, GERD, hyperlipidemia, idiopathic neuropathy, essential hypertension and percutaneous coronary angioplasty, takes Plavix.   Past Medical History:  Diagnosis Date   Arthritis    Diverticulosis of colon    External thrombosed hemorrhoids 06/11/2009   Qualifier: Diagnosis of  By: Sarajane Jews MD, Ishmael Holter    GERD (gastroesophageal reflux disease)    Hypertension     Past Surgical History:  Procedure Laterality Date   cataract extaction, right  6/09   per Dr. [name]   CATARACT EXTRACTION Left    COLONOSCOPY  09/23/04   per Dr. [name], clear, no repeats needed    CORONARY STENT INTERVENTION N/A 06/06/2021   Procedure: CORONARY STENT INTERVENTION;  Surgeon: [name], MD;   Location: [facility];  Service: Cardiovascular;  Laterality: N/A;   CORONARY/GRAFT ACUTE MI REVASCULARIZATION N/A 06/06/2021   Procedure: Coronary/Graft Acute MI Revascularization;  Surgeon: [name], MD;  Location: [facility];  Service: Cardiovascular;  Laterality: N/A;   EGD and esophageal diatation,  05-10-11   per Dr. [name]     HERNIA REPAIR     LEFT HEART CATH AND CORONARY ANGIOGRAPHY N/A 06/06/2021   Procedure: LEFT HEART CATH AND CORONARY ANGIOGRAPHY;  Surgeon: [name], MD;  Location: [facility];  Service: Cardiovascular;  Laterality: N/A;   rt inguinal hernia     x2    Allergies  Allergen Reactions   Contrast Media [Iodinated Contrast Media] Rash    Outpatient Encounter Medications as of 03/31/2022  Medication Sig   traMADol (ULTRAM) 50 MG tablet Take 1 tablet (50 mg total) by mouth daily.   traMADol (ULTRAM) 50 MG tablet Take 1 tablet (50 mg total) by mouth 2 (two) times daily as needed.   acetaminophen (TYLENOL) 325 MG tablet Take 650 mg by mouth 2 (two) times daily.   apixaban (ELIQUIS) 5 MG TABS tablet Take 1 tablet (5 mg total) by mouth 2 (two) times daily.   atorvastatin (LIPITOR) 40 MG tablet Take 1 tablet (40 mg total) by mouth daily.   clopidogrel (PLAVIX) 75 MG tablet Take 1 tablet (75 mg total) by mouth daily. Take 2 tablets (150 mg) on the  first day (12/24/21)   docusate sodium (COLACE) 100 MG capsule Take 100 mg by mouth 2 (two) times daily.   gabapentin (NEURONTIN) 300 MG capsule TAKE 1 CAPSULE BY MOUTH THREE TIMES A DAY   guaiFENesin (MUCINEX) 600 MG 12 hr tablet Take 1,200 mg by mouth 2 (two) times daily as needed for cough.   guaiFENesin (ROBITUSSIN) 100 MG/5ML liquid Take 5 mLs by mouth every 4 (four) hours as needed for cough or to loosen phlegm.   metoprolol tartrate (LOPRESSOR) 25 MG tablet Take 1 tablet (25 mg) by mouth in the AM, then take 0.5 tablet (12.5 mg) by mouth in the PM.   nitroGLYCERIN (NITROSTAT) 0.4 MG SL  tablet Place 1 tablet (0.4 mg total) under the tongue every 5 (five) minutes as needed for chest pain.   pantoprazole (PROTONIX) 40 MG tablet Take 1 tablet (40 mg total) by mouth daily.   potassium chloride (KLOR-CON 10) 10 MEQ tablet Take 1 tablet (10 mEq total) by mouth daily.   vitamin C (ASCORBIC ACID) 500 MG tablet Take 500 mg by mouth daily. Reported on 10/27/2015   [DISCONTINUED] azithromycin (ZITHROMAX Z-PAK) 250 MG tablet As directed   No facility-administered encounter medications on file as of 03/31/2022.    Review of Systems:  Review of Systems  Constitutional:  Negative for activity change, appetite change and fever.  HENT:  Negative for sore throat.   Eyes: Negative.   Cardiovascular:  Negative for chest pain and leg swelling.  Gastrointestinal:  Negative for abdominal distention, diarrhea and vomiting.  Genitourinary:  Negative for dysuria, frequency and urgency.  Musculoskeletal:  Positive for joint swelling.  Skin:  Negative for color change.  Neurological:  Negative for dizziness and headaches.  Psychiatric/Behavioral:  Negative for behavioral problems and sleep disturbance. The patient is not nervous/anxious.     Health Maintenance  Topic Date Due   DTaP/Tdap/Td (2 - Tdap) 12/19/2017   INFLUENZA VACCINE  11/24/2021   COVID-19 Vaccine (5 - 2023-24 season) 12/25/2021   OPHTHALMOLOGY EXAM  10/24/2023 (Originally 10/24/2018)   HEMOGLOBIN A1C  04/15/2022   Medicare Annual Wellness (AWV)  06/22/2022   FOOT EXAM  03/09/2023   Pneumonia Vaccine 68+ Years old  Completed   Zoster Vaccines- Shingrix  Completed   HPV VACCINES  Aged Out    Physical Exam: There were no vitals filed for this visit. There is no height or weight on file to calculate BMI. Physical Exam HENT:     Head: Normocephalic and atraumatic.     Mouth/Throat:     Mouth: Mucous membranes are moist.  Eyes:     Conjunctiva/sclera: Conjunctivae normal.  Cardiovascular:     Rate and Rhythm: Normal rate  and regular rhythm.     Pulses: Normal pulses.     Heart sounds: Normal heart sounds.  Pulmonary:     Effort: Pulmonary effort is normal.     Breath sounds: Normal breath sounds.  Abdominal:     General: Bowel sounds are normal.     Palpations: Abdomen is soft.  Musculoskeletal:        General: No swelling. Normal range of motion.     Cervical back: Normal range of motion.  Skin:    General: Skin is warm and dry.  Neurological:     General: No focal deficit present.     Mental Status: He is alert and oriented to person, place, and time.  Psychiatric:        Mood and Affect: Mood normal.  Behavior: Behavior normal.        Thought Content: Thought content normal.        Judgment: Judgment normal.     Labs reviewed: Basic Metabolic Panel: Recent Labs    10/14/21 1141 11/02/21 1640 11/02/21 1648 11/02/21 2130 11/03/21 0537 11/04/21 0548 03/23/22 2255 03/30/22 0000  NA 134* 132*  --  139 133*  132* 134* 132* 131*  K 4.6 3.7  --  2.4* 4.1  4.0 3.8 4.1 4.4  CL 101 105  --  120* 104  104 108 101 98*  CO2 27 18*  --  12* 21*  21* 19* 23 26*  GLUCOSE 102* 116*  --  97 155*  162* 154* 148*  --   BUN 12 18  --  '11 16  16 16 16 13  '$ CREATININE 0.58 0.72  --  0.36* 0.51*  0.52* 0.51* 0.77 0.5*  CALCIUM 9.5 8.5*  --  5.6* 8.3*  8.3* 8.0* 8.5* 8.5*  MG  --  1.9  --  1.3* 1.9  --   --   --   PHOS  --   --  3.3  --  4.4  --   --   --   TSH 3.81  --  2.039  --   --   --   --   --    Liver Function Tests: Recent Labs    11/02/21 2130 11/03/21 0537 11/04/21 0548  AST 45* 61* 63*  ALT 24 39 37  ALKPHOS 52 83 74  BILITOT 1.3* 1.5* 0.8  PROT 3.7* 5.9* 5.2*  ALBUMIN 1.8* 3.1* 2.7*   No results for input(s): "LIPASE", "AMYLASE" in the last 8760 hours. No results for input(s): "AMMONIA" in the last 8760 hours. CBC: Recent Labs    11/02/21 1640 11/03/21 0537 11/03/21 1737 11/04/21 0548 11/05/21 0450 03/23/22 2255 03/30/22 0000  WBC 7.0 3.8*   < > 9.7 9.0 5.0  7.2  NEUTROABS 4.7 2.9  --   --   --  3.3  --   HGB 12.3* 12.7*   < > 11.4* 11.1* 12.8* 12.7*  HCT 35.9* 37.0*   < > 33.3* 32.7* 38.5* 37*  MCV 93.5 92.7   < > 93.0 94.0 94.1  --   PLT 177 163   < > 157 162 216 356   < > = values in this interval not displayed.   Lipid Panel: Recent Labs    06/06/21 2238 10/14/21 1141  CHOL 151 93  HDL 44 45.60  LDLCALC 77 33  TRIG 151* 71.0  CHOLHDL 3.4 2   Lab Results  Component Value Date   HGBA1C 5.5 10/14/2021    Procedures since last visit: CT Elbow Right Wo Contrast  Result Date: 03/24/2022 CLINICAL DATA:  Joint effusion and suspicious changes of fracture on recent plain film EXAM: CT OF THE UPPER RIGHT EXTREMITY WITHOUT CONTRAST TECHNIQUE: Multidetector CT imaging of the upper right extremity was performed according to the standard protocol. RADIATION DOSE REDUCTION: This exam was performed according to the departmental dose-optimization program which includes automated exposure control, adjustment of the mA and/or kV according to patient size and/or use of iterative reconstruction technique. COMPARISON:  Plain film from the previous day. FINDINGS: Bones/Joint/Cartilage Distal humerus and proximal radius and ulna are well visualized and within normal limits. The curvilinear lucency identified on the prior plain film is not borne out on this exam. No fracture or dislocation is seen. Ligaments Suboptimally assessed by CT. Muscles  and Tendons Surrounding musculature is within normal limits. Soft tissues Surrounding soft tissue structures are within normal limits. No joint effusion is seen. IMPRESSION: No acute bony fracture is noted. Electronically Signed   By: Inez Catalina M.D.   On: 03/24/2022 02:30   DG Knee Complete 4 Views Right  Result Date: 03/23/2022 CLINICAL DATA:  Right knee pain after fall EXAM: RIGHT KNEE - COMPLETE 4+ VIEW COMPARISON:  None Available. FINDINGS: No definite acute fracture. No dislocation. Moderate knee joint  effusion. Soft tissue swelling about the knee. Demineralization. IMPRESSION: No definite fracture.  Moderate effusion. Electronically Signed   By: Placido Sou M.D.   On: 03/23/2022 23:42   DG Pelvis Portable  Result Date: 03/23/2022 CLINICAL DATA:  Fall EXAM: PORTABLE PELVIS 1-2 VIEWS COMPARISON:  None Available. FINDINGS: There is no evidence of pelvic fracture or diastasis. No pelvic bone lesions are seen. Degenerative changes pubic symphysis, both hips, SI joints and lower lumbar spine. IMPRESSION: No acute fracture or dislocation. Electronically Signed   By: Placido Sou M.D.   On: 03/23/2022 23:40   DG Chest Port 1 View  Result Date: 03/23/2022 CLINICAL DATA:  Status post fall EXAM: PORTABLE CHEST 1 VIEW COMPARISON:  CT chest angiogram 11/03/2021 and radiographs 11/02/2021 FINDINGS: Borderline cardiomegaly. Aortic atherosclerotic calcification. No focal consolidation, pleural effusion, or pneumothorax. No acute osseous abnormality. IMPRESSION: No active disease. Electronically Signed   By: Placido Sou M.D.   On: 03/23/2022 23:39   DG Elbow Complete Right  Result Date: 03/23/2022 CLINICAL DATA:  Right elbow pain after fall EXAM: RIGHT ELBOW - COMPLETE 3+ VIEW COMPARISON:  None Available. FINDINGS: Cortical irregularity and curvilinear lucency through the distal humerus suspicious for supracondylar fracture versus projection. Moderate elbow joint effusion. Soft tissue swelling greatest posteriorly. IMPRESSION: Suspected supracondylar fracture of the distal humerus. Consider CT for further evaluation. Electronically Signed   By: Placido Sou M.D.   On: 03/23/2022 23:37   CT HEAD WO CONTRAST (5MM)  Result Date: 03/23/2022 CLINICAL DATA:  Un witnessed fall with headaches and neck pain, initial encounter EXAM: CT HEAD WITHOUT CONTRAST CT CERVICAL SPINE WITHOUT CONTRAST TECHNIQUE: Multidetector CT imaging of the head and cervical spine was performed following the standard protocol  without intravenous contrast. Multiplanar CT image reconstructions of the cervical spine were also generated. RADIATION DOSE REDUCTION: This exam was performed according to the departmental dose-optimization program which includes automated exposure control, adjustment of the mA and/or kV according to patient size and/or use of iterative reconstruction technique. COMPARISON:  04/12/2016 FINDINGS: CT HEAD FINDINGS Brain: No evidence of acute infarction, hemorrhage, hydrocephalus, extra-axial collection or mass lesion/mass effect. Chronic atrophic and ischemic changes are noted. Vascular: No hyperdense vessel or unexpected calcification. Skull: Normal. Negative for fracture or focal lesion. Sinuses/Orbits: Left mastoid effusion is seen. Paranasal sinuses are otherwise within normal limits. Orbits and their contents are unremarkable. Other: None. CT CERVICAL SPINE FINDINGS Alignment: Loss of the normal cervical lordosis is noted. Skull base and vertebrae: 7 cervical segments are well visualized. Multilevel osteophytic change and facet hypertrophic changes seen. Multilevel disc space narrowing is noted as well. No acute fracture is seen. The odontoid is within normal limits. Soft tissues and spinal canal: No acute soft tissue abnormality is noted. Upper chest: Visualized lung fields are within normal limits. Other: None IMPRESSION: CT of the head: Chronic atrophic and ischemic changes without acute abnormality. Chronic left mastoid effusion. CT of the cervical spine: Multilevel degenerative change without acute abnormality. Electronically Signed  By: Inez Catalina M.D.   On: 03/23/2022 23:30   CT Cervical Spine Wo Contrast  Result Date: 03/23/2022 CLINICAL DATA:  Un witnessed fall with headaches and neck pain, initial encounter EXAM: CT HEAD WITHOUT CONTRAST CT CERVICAL SPINE WITHOUT CONTRAST TECHNIQUE: Multidetector CT imaging of the head and cervical spine was performed following the standard protocol without  intravenous contrast. Multiplanar CT image reconstructions of the cervical spine were also generated. RADIATION DOSE REDUCTION: This exam was performed according to the departmental dose-optimization program which includes automated exposure control, adjustment of the mA and/or kV according to patient size and/or use of iterative reconstruction technique. COMPARISON:  04/12/2016 FINDINGS: CT HEAD FINDINGS Brain: No evidence of acute infarction, hemorrhage, hydrocephalus, extra-axial collection or mass lesion/mass effect. Chronic atrophic and ischemic changes are noted. Vascular: No hyperdense vessel or unexpected calcification. Skull: Normal. Negative for fracture or focal lesion. Sinuses/Orbits: Left mastoid effusion is seen. Paranasal sinuses are otherwise within normal limits. Orbits and their contents are unremarkable. Other: None. CT CERVICAL SPINE FINDINGS Alignment: Loss of the normal cervical lordosis is noted. Skull base and vertebrae: 7 cervical segments are well visualized. Multilevel osteophytic change and facet hypertrophic changes seen. Multilevel disc space narrowing is noted as well. No acute fracture is seen. The odontoid is within normal limits. Soft tissues and spinal canal: No acute soft tissue abnormality is noted. Upper chest: Visualized lung fields are within normal limits. Other: None IMPRESSION: CT of the head: Chronic atrophic and ischemic changes without acute abnormality. Chronic left mastoid effusion. CT of the cervical spine: Multilevel degenerative change without acute abnormality. Electronically Signed   By: Inez Catalina M.D.   On: 03/23/2022 23:30    Assessment/Plan  1. Closed fracture of distal end of right humerus, unspecified fracture morphology, sequela -  Acetaminophen 325 mg 2 tabs = 650 mg BID @ 12 noon and 8PM - traMADol (ULTRAM) 50 MG tablet; Take 1 tablet (50 mg total) by mouth daily.  Dispense: 30 tablet; Refill: 0 - traMADol (ULTRAM) 50 MG tablet; Take 1 tablet (50  mg total) by mouth 2 (two) times daily as needed.  Dispense: 30 tablet; Refill: 0 -Nonweightbearing with right arm -  RUE on cast -  follow up with orthopedics  2. History of pulmonary embolism -  no SOB -  continue Eliquis  3. Slow transit constipation -  will start on Colace 100 mg BID   Family/staff communication: Discussed plan of care with resident, daughter and girlfriend.  Labs/tests ordered:  None

## 2022-04-01 DIAGNOSIS — R29898 Other symptoms and signs involving the musculoskeletal system: Secondary | ICD-10-CM | POA: Diagnosis not present

## 2022-04-01 DIAGNOSIS — S079XXD Crushing injury of head, part unspecified, subsequent encounter: Secondary | ICD-10-CM | POA: Diagnosis not present

## 2022-04-01 DIAGNOSIS — M25561 Pain in right knee: Secondary | ICD-10-CM | POA: Diagnosis not present

## 2022-04-01 DIAGNOSIS — R2681 Unsteadiness on feet: Secondary | ICD-10-CM | POA: Diagnosis not present

## 2022-04-01 DIAGNOSIS — R1312 Dysphagia, oropharyngeal phase: Secondary | ICD-10-CM | POA: Diagnosis not present

## 2022-04-01 NOTE — Progress Notes (Deleted)
This encounter was created in error - please disregard.

## 2022-04-02 DIAGNOSIS — M25561 Pain in right knee: Secondary | ICD-10-CM | POA: Diagnosis not present

## 2022-04-02 DIAGNOSIS — S079XXD Crushing injury of head, part unspecified, subsequent encounter: Secondary | ICD-10-CM | POA: Diagnosis not present

## 2022-04-02 DIAGNOSIS — R1312 Dysphagia, oropharyngeal phase: Secondary | ICD-10-CM | POA: Diagnosis not present

## 2022-04-02 DIAGNOSIS — R29898 Other symptoms and signs involving the musculoskeletal system: Secondary | ICD-10-CM | POA: Diagnosis not present

## 2022-04-02 DIAGNOSIS — R2681 Unsteadiness on feet: Secondary | ICD-10-CM | POA: Diagnosis not present

## 2022-04-05 DIAGNOSIS — S079XXD Crushing injury of head, part unspecified, subsequent encounter: Secondary | ICD-10-CM | POA: Diagnosis not present

## 2022-04-05 DIAGNOSIS — R1312 Dysphagia, oropharyngeal phase: Secondary | ICD-10-CM | POA: Diagnosis not present

## 2022-04-05 DIAGNOSIS — R29898 Other symptoms and signs involving the musculoskeletal system: Secondary | ICD-10-CM | POA: Diagnosis not present

## 2022-04-05 DIAGNOSIS — M25561 Pain in right knee: Secondary | ICD-10-CM | POA: Diagnosis not present

## 2022-04-05 DIAGNOSIS — R2681 Unsteadiness on feet: Secondary | ICD-10-CM | POA: Diagnosis not present

## 2022-04-05 NOTE — Progress Notes (Signed)
This encounter was created in error - please disregard.

## 2022-04-06 DIAGNOSIS — R2681 Unsteadiness on feet: Secondary | ICD-10-CM | POA: Diagnosis not present

## 2022-04-06 DIAGNOSIS — M25561 Pain in right knee: Secondary | ICD-10-CM | POA: Diagnosis not present

## 2022-04-06 DIAGNOSIS — R29898 Other symptoms and signs involving the musculoskeletal system: Secondary | ICD-10-CM | POA: Diagnosis not present

## 2022-04-06 DIAGNOSIS — S079XXD Crushing injury of head, part unspecified, subsequent encounter: Secondary | ICD-10-CM | POA: Diagnosis not present

## 2022-04-06 DIAGNOSIS — R1312 Dysphagia, oropharyngeal phase: Secondary | ICD-10-CM | POA: Diagnosis not present

## 2022-04-07 DIAGNOSIS — R2681 Unsteadiness on feet: Secondary | ICD-10-CM | POA: Diagnosis not present

## 2022-04-07 DIAGNOSIS — S079XXD Crushing injury of head, part unspecified, subsequent encounter: Secondary | ICD-10-CM | POA: Diagnosis not present

## 2022-04-07 DIAGNOSIS — R29898 Other symptoms and signs involving the musculoskeletal system: Secondary | ICD-10-CM | POA: Diagnosis not present

## 2022-04-07 DIAGNOSIS — M25561 Pain in right knee: Secondary | ICD-10-CM | POA: Diagnosis not present

## 2022-04-07 DIAGNOSIS — R1312 Dysphagia, oropharyngeal phase: Secondary | ICD-10-CM | POA: Diagnosis not present

## 2022-04-08 DIAGNOSIS — R1312 Dysphagia, oropharyngeal phase: Secondary | ICD-10-CM | POA: Diagnosis not present

## 2022-04-08 DIAGNOSIS — R29898 Other symptoms and signs involving the musculoskeletal system: Secondary | ICD-10-CM | POA: Diagnosis not present

## 2022-04-08 DIAGNOSIS — S079XXD Crushing injury of head, part unspecified, subsequent encounter: Secondary | ICD-10-CM | POA: Diagnosis not present

## 2022-04-08 DIAGNOSIS — M25561 Pain in right knee: Secondary | ICD-10-CM | POA: Diagnosis not present

## 2022-04-08 DIAGNOSIS — R2681 Unsteadiness on feet: Secondary | ICD-10-CM | POA: Diagnosis not present

## 2022-04-09 DIAGNOSIS — R1312 Dysphagia, oropharyngeal phase: Secondary | ICD-10-CM | POA: Diagnosis not present

## 2022-04-09 DIAGNOSIS — M25561 Pain in right knee: Secondary | ICD-10-CM | POA: Diagnosis not present

## 2022-04-09 DIAGNOSIS — R2681 Unsteadiness on feet: Secondary | ICD-10-CM | POA: Diagnosis not present

## 2022-04-09 DIAGNOSIS — S079XXD Crushing injury of head, part unspecified, subsequent encounter: Secondary | ICD-10-CM | POA: Diagnosis not present

## 2022-04-09 DIAGNOSIS — M25521 Pain in right elbow: Secondary | ICD-10-CM | POA: Diagnosis not present

## 2022-04-09 DIAGNOSIS — R29898 Other symptoms and signs involving the musculoskeletal system: Secondary | ICD-10-CM | POA: Diagnosis not present

## 2022-04-12 DIAGNOSIS — M25561 Pain in right knee: Secondary | ICD-10-CM | POA: Diagnosis not present

## 2022-04-12 DIAGNOSIS — R2681 Unsteadiness on feet: Secondary | ICD-10-CM | POA: Diagnosis not present

## 2022-04-12 DIAGNOSIS — R1312 Dysphagia, oropharyngeal phase: Secondary | ICD-10-CM | POA: Diagnosis not present

## 2022-04-12 DIAGNOSIS — R29898 Other symptoms and signs involving the musculoskeletal system: Secondary | ICD-10-CM | POA: Diagnosis not present

## 2022-04-12 DIAGNOSIS — S079XXD Crushing injury of head, part unspecified, subsequent encounter: Secondary | ICD-10-CM | POA: Diagnosis not present

## 2022-04-13 DIAGNOSIS — R2681 Unsteadiness on feet: Secondary | ICD-10-CM | POA: Diagnosis not present

## 2022-04-13 DIAGNOSIS — S079XXD Crushing injury of head, part unspecified, subsequent encounter: Secondary | ICD-10-CM | POA: Diagnosis not present

## 2022-04-13 DIAGNOSIS — M25621 Stiffness of right elbow, not elsewhere classified: Secondary | ICD-10-CM | POA: Diagnosis not present

## 2022-04-13 DIAGNOSIS — M25561 Pain in right knee: Secondary | ICD-10-CM | POA: Diagnosis not present

## 2022-04-13 DIAGNOSIS — R1312 Dysphagia, oropharyngeal phase: Secondary | ICD-10-CM | POA: Diagnosis not present

## 2022-04-13 DIAGNOSIS — R29898 Other symptoms and signs involving the musculoskeletal system: Secondary | ICD-10-CM | POA: Diagnosis not present

## 2022-04-13 DIAGNOSIS — M25521 Pain in right elbow: Secondary | ICD-10-CM | POA: Diagnosis not present

## 2022-04-14 ENCOUNTER — Encounter: Payer: Self-pay | Admitting: Cardiovascular Disease

## 2022-04-14 ENCOUNTER — Ambulatory Visit: Payer: Medicare Other | Attending: Cardiovascular Disease | Admitting: Cardiovascular Disease

## 2022-04-14 DIAGNOSIS — I480 Paroxysmal atrial fibrillation: Secondary | ICD-10-CM | POA: Diagnosis not present

## 2022-04-14 DIAGNOSIS — S42401S Unspecified fracture of lower end of right humerus, sequela: Secondary | ICD-10-CM | POA: Insufficient documentation

## 2022-04-14 DIAGNOSIS — I358 Other nonrheumatic aortic valve disorders: Secondary | ICD-10-CM | POA: Insufficient documentation

## 2022-04-14 DIAGNOSIS — M25561 Pain in right knee: Secondary | ICD-10-CM | POA: Diagnosis not present

## 2022-04-14 DIAGNOSIS — I255 Ischemic cardiomyopathy: Secondary | ICD-10-CM

## 2022-04-14 DIAGNOSIS — I2699 Other pulmonary embolism without acute cor pulmonale: Secondary | ICD-10-CM | POA: Insufficient documentation

## 2022-04-14 DIAGNOSIS — U071 COVID-19: Secondary | ICD-10-CM | POA: Diagnosis not present

## 2022-04-14 DIAGNOSIS — I10 Essential (primary) hypertension: Secondary | ICD-10-CM | POA: Insufficient documentation

## 2022-04-14 DIAGNOSIS — I2102 ST elevation (STEMI) myocardial infarction involving left anterior descending coronary artery: Secondary | ICD-10-CM | POA: Diagnosis not present

## 2022-04-14 DIAGNOSIS — R1312 Dysphagia, oropharyngeal phase: Secondary | ICD-10-CM | POA: Diagnosis not present

## 2022-04-14 DIAGNOSIS — I2510 Atherosclerotic heart disease of native coronary artery without angina pectoris: Secondary | ICD-10-CM | POA: Insufficient documentation

## 2022-04-14 DIAGNOSIS — R2681 Unsteadiness on feet: Secondary | ICD-10-CM | POA: Diagnosis not present

## 2022-04-14 DIAGNOSIS — J9600 Acute respiratory failure, unspecified whether with hypoxia or hypercapnia: Secondary | ICD-10-CM | POA: Insufficient documentation

## 2022-04-14 DIAGNOSIS — S079XXD Crushing injury of head, part unspecified, subsequent encounter: Secondary | ICD-10-CM | POA: Diagnosis not present

## 2022-04-14 DIAGNOSIS — Z9861 Coronary angioplasty status: Secondary | ICD-10-CM | POA: Diagnosis not present

## 2022-04-14 DIAGNOSIS — R29898 Other symptoms and signs involving the musculoskeletal system: Secondary | ICD-10-CM | POA: Diagnosis not present

## 2022-04-14 MED ORDER — DIPHENHYDRAMINE HCL 50 MG PO TABS: 50.0000 mg | ORAL_TABLET | Freq: Every evening | ORAL | 0 refills | Status: DC | PRN

## 2022-04-14 MED ORDER — PREDNISONE 50 MG PO TABS: 50.0000 mg | ORAL_TABLET | Freq: Once | ORAL | 0 refills | Status: AC

## 2022-04-14 NOTE — Progress Notes (Signed)
Cardiology Office Note    Date:  04/17/2022   ID:  Gabriel Gabriel [name], Gabriel Gabriel [name] 07-28-1924, MRN [id number]  PCP:  Laurey Morale, MD  Cardiologist:  Shelva Majestic, MD   4 month F/U office    Gabriel [name] of Present Illness:  Gabriel Gabriel [name] is a 88 y.o. male who I saw on December 23, 2021 for an evaluation following his February 2023 hospitalization.  He presents for 36-monthfollow-up evaluation.  Gabriel Gabriel [name] of hypertension, GERD, who was hospitalized in February 2023 in the setting of ST segment elevation myocardial infarction.  He was found to have thrombotic 90% mid LAD stenosis with 99% subtotal occlusion of the mid distal LAD with otherwise mild nonobstructive CAD in the circumflex and with ectasia without significant stenosis in the RCA.  He underwent emergent catheterization by Dr. And and underwent successful DES stenting with nonoverlapping 3.0 x 12 mm and 2.0 x 15 mm Medtronic Onyx stents.  He was started on DAPT PT with aspirin and Brilinta.  An echo Doppler study showed EF 40 to 45% with apical anterior wall hypokinesis.  An initial ECG raises concern for possible A-fib but repeat ECG shows sinus bradycardia with occasional junctional beats.  DOAC was not started and he ultimately stabilized and was discharged on aspirin/Brilinta.  [facility].  Following his hospitalization, he apparently had fallen out of bed on several occasions and ultimately was transition to assisted living.  He has seen EDiona Browner NP on 2 occasions following his hospitalization and last saw her on Sep 02, 2021.  At that time he was stable I continue to be on aspirin/Brilinta, metoprolol tartrate 25 mg twice a day, atorvastatin 40 mg daily, in addition to gabapentin and pantoprazole.  Apparently, he was hospitalized on November 02, 2021 after development of COVID with COVID-pneumonia.  CT angio of his chest and was done which revealed lobar and  segmental pulmonary emboli in branches of the right upper lobe with small overall clot burden. There was CT evidence for right heart strain.  There was mild subpleural patchy opacities in the lungs bilaterally, lower lobe predominant felt related to his COVID-pneumonia.  During that hospitalization, he was started on Eliquis and advised to stop taking aspirin but to continue ticagrelor.  An echo Doppler study on November 02, 2021 showed hyperdynamic LV function with EF at 65 to 70% without wall motion abnormalities.  There was grade 2 diastolic dysfunction.  He had mildly elevated PA systolic pressure 362.9mm.  Left atrium was mildly dilated.  There was mild mitral regurgitation.  There was mild aortic sclerosis.  When I saw him on December 23, 2021 he was here with his daughter He has been living in [facility] living [facility].  He walks with a walker and also rides a scooter.  He has been participating in physical therapy 3 days/week.  His primary provider is Dr. SAlysia Penna  He denied any chest tightness or awareness of palpitations, presyncope or syncope.  During that evaluation, I recommended that he discontinue ticagrelor since he was on Eliquis due to potential increased risk for bleed and recommended changing him to clopidogrel.  I suggested that perhaps after 6 months of his PE if he remains stable anticoagulation can be discontinued and he can be maintained on aspirin/Plavix.  Since I saw him, Mr. KMaudie Mercuryat apparently fell and broke his elbow of his  right arm on November 28.  He is now in a cast.  Apparently, this happened late at the night when he was coming back to his room and was told by the staff that he needed to take a shower and when he went off the carpet to the hardwood floors his walker slipped and he fell.  Presently, he denies chest pain or shortness of breath.  He denies any presyncope or syncope.  He presents for evaluation.   Past Medical Gabriel [name]:  Diagnosis Date   Arthritis     Diverticulosis of colon    External thrombosed hemorrhoids 06/11/2009   Qualifier: Diagnosis of  By: Sarajane Jews MD, Ishmael Holter    GERD (gastroesophageal reflux disease)    Hypertension     Past Surgical Gabriel [name]:  Procedure Laterality Date   cataract extaction, right  6/09   per Dr. [name]   CATARACT EXTRACTION Left    COLONOSCOPY  09/23/04   per Dr. [name], clear, no repeats needed    CORONARY STENT INTERVENTION N/A 06/06/2021   Procedure: CORONARY STENT INTERVENTION;  Surgeon: [name], MD;  Location: [facility];  Service: Cardiovascular;  Laterality: N/A;   CORONARY/GRAFT ACUTE MI REVASCULARIZATION N/A 06/06/2021   Procedure: Coronary/Graft Acute MI Revascularization;  Surgeon: [name], MD;  Location: [facility];  Service: Cardiovascular;  Laterality: N/A;   EGD and esophageal diatation,  05-10-11   per Dr. [name]     HERNIA REPAIR     LEFT HEART CATH AND CORONARY ANGIOGRAPHY N/A 06/06/2021   Procedure: LEFT HEART CATH AND CORONARY ANGIOGRAPHY;  Surgeon: [name], MD;  Location: [facility];  Service: Cardiovascular;  Laterality: N/A;   rt inguinal hernia     x2    Current Medications: Outpatient Medications Prior to Visit  Medication Sig Dispense Refill   acetaminophen (TYLENOL) 325 MG tablet Take 650 mg by mouth 2 (two) times daily.     apixaban (ELIQUIS) 5 MG TABS tablet Take 1 tablet (5 mg total) by mouth 2 (two) times daily. 60 tablet 0   atorvastatin (LIPITOR) 40 MG tablet Take 1 tablet (40 mg total) by mouth daily. 90 tablet 3   clopidogrel (PLAVIX) 75 MG tablet Take 1 tablet (75 mg total) by mouth daily. Take 2 tablets (150 mg) on the first day (12/24/21) 90 tablet 3   docusate sodium (COLACE) 100 MG capsule Take 100 mg by mouth 2 (two) times daily.     gabapentin (NEURONTIN) 300 MG capsule TAKE 1 CAPSULE BY MOUTH THREE TIMES A DAY 270 capsule 1   metoprolol tartrate (LOPRESSOR) 25 MG tablet Take 1 tablet (25 mg) by mouth in the AM,  then take 0.5 tablet (12.5 mg) by mouth in the PM. 180 tablet 3   nitroGLYCERIN (NITROSTAT) 0.4 MG SL tablet Place 1 tablet (0.4 mg total) under the tongue every 5 (five) minutes as needed for chest pain. 25 tablet 3   pantoprazole (PROTONIX) 40 MG tablet Take 1 tablet (40 mg total) by mouth daily. 30 tablet 0   potassium chloride (KLOR-CON 10) 10 MEQ tablet Take 1 tablet (10 mEq total) by mouth daily. 30 tablet 0   traMADol (ULTRAM) 50 MG tablet Take 1 tablet (50 mg total) by mouth daily. 30 tablet 0   traMADol (ULTRAM) 50 MG tablet Take 1 tablet (50 mg total) by mouth 2 (two) times daily as needed. 30 tablet 0   vitamin C (ASCORBIC ACID) 500 MG tablet Take 500 mg by  mouth daily. Reported on 10/27/2015     guaiFENesin (MUCINEX) 600 MG 12 hr tablet Take 1,200 mg by mouth 2 (two) times daily as needed for cough. (Patient not taking: Reported on 04/14/2022)     guaiFENesin (ROBITUSSIN) 100 MG/5ML liquid Take 5 mLs by mouth every 4 (four) hours as needed for cough or to loosen phlegm. (Patient not taking: Reported on 04/14/2022)     No facility-administered medications prior to visit.     Allergies:   Contrast media [iodinated contrast media]   Social Gabriel [name]   Socioeconomic Gabriel [name]   Marital status: Widowed    Spouse name: Not on file   Number of children: 3   Years of education: Not on file   Highest education level: Not on file  Occupational Gabriel [name]   Occupation: retired  Tobacco Use   Smoking status: Former    Packs/day: 15.00    Years: 0.50    Total pack years: 7.50    Types: Cigarettes   Smokeless tobacco: Never   Tobacco comments:    smoked 18 and stopped and states he stopped 30's   Vaping Use   Vaping Use: Never used  Substance and Sexual Activity   Alcohol use: No    Alcohol/week: 0.0 standard drinks of alcohol   Drug use: No   Sexual activity: Never    Birth control/protection: Abstinence  Other Topics Concern   Not on file  Social Gabriel [name] Narrative   Lives in  studio at [place] since 2009, was married x 57 years.    3 daughters.    Social Determinants of Health   Financial Resource Strain: Low Risk  (06/22/2021)   Overall Financial Resource Strain (CARDIA)    Difficulty of Paying Living Expenses: Not hard at all  Food Insecurity: No Food Insecurity (06/22/2021)   Hunger Vital Sign    Worried About Running Out of Food in the Last Year: Never true    Ran Out of Food in the Last Year: Never true  Transportation Needs: No Transportation Needs (06/22/2021)   PRAPARE - Hydrologist (Medical): No    Lack of Transportation (Non-Medical): No  Physical Activity: Insufficiently Active (06/22/2021)   Exercise Vital Sign    Days of Exercise per Week: 3 days    Minutes of Exercise per Session: 30 min  Stress: No Stress Concern Present (06/22/2021)   [place]    Feeling of Stress : Only a little  Social Connections: Moderately Integrated (06/22/2021)   Social Connection and Isolation Panel [NHANES]    Frequency of Communication with Friends and Family: More than three times a week    Frequency of Social Gatherings with Friends and Family: More than three times a week    Attends Religious Services: 1 to 4 times per year    Active Member of [employer] or Organizations: No    Attends Archivist Meetings: 1 to 4 times per year    Marital Status: Widowed     Family Gabriel [name]:  The patient's family Gabriel [name] includes Coronary artery disease in his father.  His parents are deceased.  ROS General: Negative; No fevers, chills, or night sweats;  HEENT: Negative; No changes in vision or hearing, sinus congestion, difficulty swallowing Pulmonary: COVID-pneumonia with lobar and segmental pulmonary emboli in branches of the right upper lobe with small clot burden July 2023 Cardiovascular: See HPI GI: Negative; No nausea, vomiting, diarrhea, or  abdominal  pain GU: Negative; No dysuria, hematuria, or difficulty voiding Musculoskeletal: Right arm in cast Hematologic/Oncology: Negative; no easy bruising, bleeding Endocrine: Negative; no heat/cold intolerance; no diabetes Neuro: Negative; no changes in balance, headaches Skin: Negative; No rashes or skin lesions Psychiatric: Negative; No behavioral problems, depression Sleep: Negative; No snoring, daytime sleepiness, hypersomnolence, bruxism, restless legs, hypnogognic hallucinations, no cataplexy Other comprehensive 14 point system review is negative.   PHYSICAL EXAM:   VS:  BP 122/60   Pulse 65   Ht _0  (1.702 m)   Wt 152 lb (68.9 kg)   SpO2 91%   BMI 23.81 kg/m     Repeat blood pressure by me was 120/62.  Wt Readings from Last 3 Encounters:  04/14/22 152 lb (68.9 kg)  03/31/22 155 lb 9 oz (70.6 kg)  03/31/22 155 lb 10.3 oz (70.6 kg)    General: Alert, oriented, no distress.  Skin: normal turgor, no rashes, warm and dry HEENT: Normocephalic, atraumatic. Pupils equal round and reactive to light; sclera anicteric; extraocular muscles intact;  Nose without nasal septal hypertrophy Mouth/Parynx benign; Mallinpatti scale 3 Neck: No JVD, no carotid bruits; normal carotid upstroke Lungs: clear to ausculatation and percussion; no wheezing or rales Chest wall: without tenderness to palpitation Heart: PMI not displaced, RRR, s1 s2 normal, 1/6 systolic murmur, no diastolic murmur, no rubs, gallops, thrills, or heaves Abdomen: soft, nontender; no hepatosplenomehaly, BS+; abdominal aorta nontender and not dilated by palpation. Back: no CVA tenderness Pulses 2+ Musculoskeletal: full range of motion, normal strength, no joint deformities Extremities: Right arm in cast no clubbing cyanosis , Homan's sign negative  Neurologic: grossly nonfocal; Cranial nerves grossly wnl Psychologic: Normal mood and affect    Studies/Labs Reviewed:   April 14, 2022 ECG (independently read by me):  Junctional rhythm vs sinus with low voltage p wave at 65, PVCs; QS V1-2  December 23, 2021 ECG (independently read by me): Probable sinus rhythm at 60  QS V1-2  Recent Labs:    Latest Ref Rng & Units 03/30/2022   12:00 AM 03/23/2022   10:55 PM 11/04/2021    5:48 AM  BMP  Glucose 70 - 99 mg/dL  148  154   BUN 4 - _1 Creatinine 0.6 - 1.3 0.5     0.77  0.51   Sodium 137 - 147 131     132  134   Potassium 3.5 - 5.1 mEq/L 4.4     4.1  3.8   Chloride 99 - 108 98     101  108   CO2 13 - _2 Calcium 8.7 - 10.7 8.5     8.5  8.0      This result is from an external source.        Latest Ref Rng & Units 11/04/2021    5:48 AM 11/03/2021    5:37 AM 11/02/2021    9:30 PM  Hepatic Function  Total Protein 6.5 - 8.1 g/dL 5.2  5.9  3.7   Albumin 3.5 - 5.0 g/dL 2.7  3.1  1.8   AST 15 - 41 U/L 63  61  45   ALT 0 - 44 U/L 37  39  24   Alk Phosphatase 38 - 126 U/L 74  83  52   Total Bilirubin 0.3 - 1.2 mg/dL 0.8  1.5  1.3  Latest Ref Rng & Units 03/30/2022   12:00 AM 03/23/2022   10:55 PM 11/05/2021    4:50 AM  CBC  WBC  7.2     5.0  9.0   Hemoglobin 13.5 - 17.5 12.7     12.8  11.1   Hematocrit 41 - 53 37     38.5  32.7   Platelets 150 - 400 K/uL 356     216  162      This result is from an external source.   Lab Results  Component Value Date   MCV 94.1 03/23/2022   MCV 94.0 11/05/2021   MCV 93.0 11/04/2021   Lab Results  Component Value Date   TSH 2.039 11/02/2021   Lab Results  Component Value Date   HGBA1C 5.5 10/14/2021     BNP    Component Value Date/Time   BNP 368.7 (H) 11/03/2021 0537    ProBNP No results found for: "PROBNP"   Lipid Panel     Component Value Date/Time   CHOL 93 10/14/2021 1141   TRIG 71.0 10/14/2021 1141   HDL 45.60 10/14/2021 1141   CHOLHDL 2 10/14/2021 1141   VLDL 14.2 10/14/2021 1141   LDLCALC 33 10/14/2021 1141   LDLDIRECT 72.0 09/27/2017 0939     RADIOLOGY: CT Elbow Right Wo  Contrast  Result Date: 03/24/2022 CLINICAL DATA:  Joint effusion and suspicious changes of fracture on recent plain film EXAM: CT OF THE UPPER RIGHT EXTREMITY WITHOUT CONTRAST TECHNIQUE: Multidetector CT imaging of the upper right extremity was performed according to the standard protocol. RADIATION DOSE REDUCTION: This exam was performed according to the departmental dose-optimization program which includes automated exposure control, adjustment of the mA and/or kV according to patient size and/or use of iterative reconstruction technique. COMPARISON:  Plain film from the previous day. FINDINGS: Bones/Joint/Cartilage Distal humerus and proximal radius and ulna are well visualized and within normal limits. The curvilinear lucency identified on the prior plain film is not borne out on this exam. No fracture or dislocation is seen. Ligaments Suboptimally assessed by CT. Muscles and Tendons Surrounding musculature is within normal limits. Soft tissues Surrounding soft tissue structures are within normal limits. No joint effusion is seen. IMPRESSION: No acute bony fracture is noted. Electronically Signed   By: Inez Catalina M.D.   On: 03/24/2022 02:30   DG Knee Complete 4 Views Right  Result Date: 03/23/2022 CLINICAL DATA:  Right knee pain after fall EXAM: RIGHT KNEE - COMPLETE 4+ VIEW COMPARISON:  None Available. FINDINGS: No definite acute fracture. No dislocation. Moderate knee joint effusion. Soft tissue swelling about the knee. Demineralization. IMPRESSION: No definite fracture.  Moderate effusion. Electronically Signed   By: Placido Sou M.D.   On: 03/23/2022 23:42   DG Pelvis Portable  Result Date: 03/23/2022 CLINICAL DATA:  Fall EXAM: PORTABLE PELVIS 1-2 VIEWS COMPARISON:  None Available. FINDINGS: There is no evidence of pelvic fracture or diastasis. No pelvic bone lesions are seen. Degenerative changes pubic symphysis, both hips, SI joints and lower lumbar spine. IMPRESSION: No acute fracture or  dislocation. Electronically Signed   By: Placido Sou M.D.   On: 03/23/2022 23:40   DG Chest Port 1 View  Result Date: 03/23/2022 CLINICAL DATA:  Status post fall EXAM: PORTABLE CHEST 1 VIEW COMPARISON:  CT chest angiogram 11/03/2021 and radiographs 11/02/2021 FINDINGS: Borderline cardiomegaly. Aortic atherosclerotic calcification. No focal consolidation, pleural effusion, or pneumothorax. No acute osseous abnormality. IMPRESSION: No active disease. Electronically Signed   By:  Placido Sou M.D.   On: 03/23/2022 23:39   DG Elbow Complete Right  Result Date: 03/23/2022 CLINICAL DATA:  Right elbow pain after fall EXAM: RIGHT ELBOW - COMPLETE 3+ VIEW COMPARISON:  None Available. FINDINGS: Cortical irregularity and curvilinear lucency through the distal humerus suspicious for supracondylar fracture versus projection. Moderate elbow joint effusion. Soft tissue swelling greatest posteriorly. IMPRESSION: Suspected supracondylar fracture of the distal humerus. Consider CT for further evaluation. Electronically Signed   By: Placido Sou M.D.   On: 03/23/2022 23:37   CT HEAD WO CONTRAST (5MM)  Result Date: 03/23/2022 CLINICAL DATA:  Un witnessed fall with headaches and neck pain, initial encounter EXAM: CT HEAD WITHOUT CONTRAST CT CERVICAL SPINE WITHOUT CONTRAST TECHNIQUE: Multidetector CT imaging of the head and cervical spine was performed following the standard protocol without intravenous contrast. Multiplanar CT image reconstructions of the cervical spine were also generated. RADIATION DOSE REDUCTION: This exam was performed according to the departmental dose-optimization program which includes automated exposure control, adjustment of the mA and/or kV according to patient size and/or use of iterative reconstruction technique. COMPARISON:  04/12/2016 FINDINGS: CT HEAD FINDINGS Brain: No evidence of acute infarction, hemorrhage, hydrocephalus, extra-axial collection or mass lesion/mass effect.  Chronic atrophic and ischemic changes are noted. Vascular: No hyperdense vessel or unexpected calcification. Skull: Normal. Negative for fracture or focal lesion. Sinuses/Orbits: Left mastoid effusion is seen. Paranasal sinuses are otherwise within normal limits. Orbits and their contents are unremarkable. Other: None. CT CERVICAL SPINE FINDINGS Alignment: Loss of the normal cervical lordosis is noted. Skull base and vertebrae: 7 cervical segments are well visualized. Multilevel osteophytic change and facet hypertrophic changes seen. Multilevel disc space narrowing is noted as well. No acute fracture is seen. The odontoid is within normal limits. Soft tissues and spinal canal: No acute soft tissue abnormality is noted. Upper chest: Visualized lung fields are within normal limits. Other: None IMPRESSION: CT of the head: Chronic atrophic and ischemic changes without acute abnormality. Chronic left mastoid effusion. CT of the cervical spine: Multilevel degenerative change without acute abnormality. Electronically Signed   By: Inez Catalina M.D.   On: 03/23/2022 23:30   CT Cervical Spine Wo Contrast  Result Date: 03/23/2022 CLINICAL DATA:  Un witnessed fall with headaches and neck pain, initial encounter EXAM: CT HEAD WITHOUT CONTRAST CT CERVICAL SPINE WITHOUT CONTRAST TECHNIQUE: Multidetector CT imaging of the head and cervical spine was performed following the standard protocol without intravenous contrast. Multiplanar CT image reconstructions of the cervical spine were also generated. RADIATION DOSE REDUCTION: This exam was performed according to the departmental dose-optimization program which includes automated exposure control, adjustment of the mA and/or kV according to patient size and/or use of iterative reconstruction technique. COMPARISON:  04/12/2016 FINDINGS: CT HEAD FINDINGS Brain: No evidence of acute infarction, hemorrhage, hydrocephalus, extra-axial collection or mass lesion/mass effect. Chronic  atrophic and ischemic changes are noted. Vascular: No hyperdense vessel or unexpected calcification. Skull: Normal. Negative for fracture or focal lesion. Sinuses/Orbits: Left mastoid effusion is seen. Paranasal sinuses are otherwise within normal limits. Orbits and their contents are unremarkable. Other: None. CT CERVICAL SPINE FINDINGS Alignment: Loss of the normal cervical lordosis is noted. Skull base and vertebrae: 7 cervical segments are well visualized. Multilevel osteophytic change and facet hypertrophic changes seen. Multilevel disc space narrowing is noted as well. No acute fracture is seen. The odontoid is within normal limits. Soft tissues and spinal canal: No acute soft tissue abnormality is noted. Upper chest: Visualized lung fields are within  normal limits. Other: None IMPRESSION: CT of the head: Chronic atrophic and ischemic changes without acute abnormality. Chronic left mastoid effusion. CT of the cervical spine: Multilevel degenerative change without acute abnormality. Electronically Signed   By: Inez Catalina M.D.   On: 03/23/2022 23:30     Additional studies/ records that were reviewed today include:   CATH/PCI: 06/06/2021 Conclusions: Severe single-vessel coronary artery disease with thrombotic 90% mid LAD stenosis and 99% subtotal occlusion of the distal LAD with TIMI-1 flow.  Mild, nonobstructive coronary artery disease noted in the LCx.  RCA is ectatic without significant stenosis. Normal left ventricular filling pressure (LVEDP ~7 mmHg). Successful PCI to mid and distal LAD using nonoverlapping Onyx Frontier 3.0 x 12 mm (mid LAD) and 2.0 x 15 mm (distal LAD) drug-eluting stents with 0% residual stenosis and TIMI-2 flow into the distal vessel.  Poor reflow most likely related to microvascular dysfunction from late presentation.   Recommendations: Admit to 2H-ICU for post STEMI monitoring/care. Complete 2 hours of cangrelor infusion. Dual antiplatelet therapy with aspirin and  ticagrelor for at least 12 months.  If atrial fibrillation persists during the hospitalization, we may need to consider transitioning to clopidogrel plus NOAC prior to discharge. Gentle post-catheterization hydration, given normal LVEDP but likely reduced LVEF. Obtain echocardiogram. Aggressive secondary prevention of coronary artery disease.    Intervention    ASSESSMENT:    1. ST elevation myocardial infarction involving left anterior descending (LAD) coronary artery [facility]): June 06, 2021   2. CAD S/P percutaneous coronary angioplasty   3. Pulmonary embolism, unspecified chronicity, unspecified pulmonary embolism type, unspecified whether acute cor pulmonale present (HCC)   4. Paroxysmal atrial fibrillation ([place])   5. Essential hypertension   6. Closed fracture dislocation of right elbow, sequela   7. Aortic valve sclerosis   8. Acute respiratory failure due to COVID-19 [facility])     PLAN:  GabrielVanauken is a very pleasant 88 year old gentleman who has a Gabriel [name] of hypertension, hyperlipidemia, presented to [facility] on June 06, 2021 with a STEMI involving his LAD.  He underwent successful emergent cardiac catheterization and stenting of his proximal to mid and mid distal LAD with DES stents.  He was treated with DAPT with aspirin/Brilinta as well as lipid-lowering therapy and beta-blocker therapy.  Apparently, upon returning to [facility], he had fallen out his bed on several occasions and as result was transition to assisted living.  When last seen by Diona Browner, NP in May 2023, he was maintaining sinus rhythm.  He  developed COVID-pneumonia in July 2023 and was admitted to the hospital on November 02, 2021.  An echo Doppler study revealed normalization of LV function with EF at 60 to 65% with grade 2 diastolic dysfunction, mildly dilated left atrium, mild MR, and mild aortic sclerosis.  A chest CT demonstrated findings consistent with pneumonia and he was started on  steroids M&M severe placed and placed on oxygen.  A chest CT however demonstrated lobar and segmental pulmonary emboli in branches of the right upper lobe with small overall clot burden.  There was a suggestion of possible right heart strain.  Apparently during his hospitalization he was started on Eliquis for anticoagulation, aspirin was discontinued, and he was told to maintain ticagrelor.  At my initial office visit with him, due to bleed risk I recommended discontinuance of ticagrelor and switched him to clopidogrel to take with Plavix and stay off aspirin.  In late January, he will be 6 months following his  pulmonary embolism.  I have recommended he undergo a follow-up chest CT to make certain this is entirely cleared and if there is no residual thrombus, my recommendation would be for him to discontinue Eliquis and resume aspirin 81 mg with clopidogrel in light of his LAD stent.  His blood pressure today is stable.  He did sustain right elbow fracture after a fall when transitioning from carpet to hardwood when his walker slipped and he fell.  I will see him in 6 months for follow-up evaluation or sooner as needed.  Medication Adjustments/Labs and Tests Ordered: Current medicines are reviewed at length with the patient today.  Concerns regarding medicines are outlined above.  Medication changes, Labs and Tests ordered today are listed in the Patient Instructions below. Patient Instructions  Medication Instructions:  TAKE Prednisone 50 mg - take 13 hours prior to test, Take another Prednisone 50 mg 7 hours prior to test, Take another Prednisone 50 mg 1 hour prior to test AND Take Benadryl 50 mg 1 hour prior to test  IF CT IS NEGATIVE WE CAN STOP ELIQUIS AND START ASPIRIN CONTINUE PLAVIX  *If you need a refill on your cardiac medications before your next appointment, please call your pharmacy*  Lab Work: BMET 1-2 DAYS BEFORE CT If you have labs (blood work) drawn today and your tests are completely  normal, you will receive your results only by:  MyChart Message (if you have MyChart) OR A paper copy in the mail If you have any lab test that is abnormal or we need to change your treatment, we will call you to review the results.  Testing/Procedures: CT FOR PULMONARY EMB-SEE BELOW  Follow-Up: At [facility], you and your health needs are our priority.  As part of our continuing mission to provide you with exceptional heart care, we have created designated Provider Care Teams.  These Care Teams include your primary Cardiologist (physician) and Advanced Practice Providers (APPs -  Physician Assistants and Nurse Practitioners) who all work together to provide you with the care you need, when you need it.  We recommend signing up for the patient portal called "MyChart".  Sign up information is provided on this After Visit Summary.  MyChart is used to connect with patients for Virtual Visits (Telemedicine).  Patients are able to view lab/test results, encounter notes, upcoming appointments, etc.  Non-urgent messages can be sent to your provider as well.   To learn more about what you can do with MyChart, go to [email or web address].    Your next appointment:   AFTER CT   The format for your next appointment:   In Person  Provider:   Shelva Majestic, MD    Other Instructions PLEASE [place] [facility] [place]         Your cardiac CT will be scheduled at one of the below locations:   [facility] [address] [place], [place] [address] [phone]  [place] [address] [place], [place] [address] [phone]  [facility] [place], [place] [address] [phone]  If scheduled at [facility], please arrive at the [facility] and Children's Entrance (Entrance C2) of [facility] 30 minutes prior to test  start time. You can use the FREE valet parking offered at entrance C (encouraged to control the heart rate for the test)  Proceed to the [facility] Radiology Department (  first floor) to check-in and test prep.  All radiology patients and guests should use entrance C2 at [facility] accessed from [facility], even though the hospital's physical address listed is [address]    If scheduled at [facility] or [facility], please arrive 15 mins early for check-in and test prep.   Please follow these instructions carefully (unless otherwise directed):  Hold all erectile dysfunction medications at least 3 days (72 hrs) prior to test. (Ie viagra, cialis, sildenafil, tadalafil, etc) We will administer nitroglycerin during this exam.   On the Night Before the Test: Be sure to Drink plenty of water. Do not consume any caffeinated/decaffeinated beverages or chocolate 12 hours prior to your test. Do not take any antihistamines 12 hours prior to your test. If the patient has contrast allergy: Patient will need a prescription for Prednisone and very clear instructions (as follows): Prednisone 50 mg - take 13 hours prior to test Take another Prednisone 50 mg 7 hours prior to test Take another Prednisone 50 mg 1 hour prior to test Take Benadryl 50 mg 1 hour prior to test Patient must complete all four doses of above prophylactic medications. Patient will need a ride after test due to Benadryl.  On the Day of the Test: Drink plenty of water until 1 hour prior to the test. Do not eat any food 1 hour prior to test. You may take your regular medications prior to the test.  Take metoprolol (Lopressor) two hours prior to test. HOLD Furosemide/Hydrochlorothiazide morning of the test. FEMALES- please wear underwire-free bra if available, avoid dresses & tight clothing     After the Test: Drink plenty of water. After  receiving IV contrast, you may experience a mild flushed feeling. This is normal. On occasion, you may experience a mild rash up to 24 hours after the test. This is not dangerous. If this occurs, you can take Benadryl 25 mg and increase your fluid intake. If you experience trouble breathing, this can be serious. If it is severe call 911 IMMEDIATELY. If it is mild, please call our office. If you take any of these medications: Glipizide/Metformin, Avandament, Glucavance, please do not take 48 hours after completing test unless otherwise instructed.  We will call to schedule your test 2-4 weeks out understanding that some insurance companies will need an authorization prior to the service being performed.   For non-scheduling related questions, please contact the cardiac imaging nurse navigator should you have any questions/concerns: Marchia Bond, Cardiac Imaging Nurse Navigator Gordy Clement, Cardiac Imaging Nurse Navigator [facility] Direct Office Dial: [phone]   For scheduling needs, including cancellations and rescheduling, please call [place], [phone].     Signed, Shelva Majestic, MD  04/17/2022 4:27 PM    [facility] [address], [place], [place], [place]  [address] Phone: [phone]

## 2022-04-14 NOTE — Patient Instructions (Addendum)
Medication Instructions:  TAKE Prednisone 50 mg - take 13 hours prior to test, Take another Prednisone 50 mg 7 hours prior to test, Take another Prednisone 50 mg 1 hour prior to test AND Take Benadryl 50 mg 1 hour prior to test  IF CT IS NEGATIVE WE CAN STOP ELIQUIS AND START ASPIRIN CONTINUE PLAVIX  *If you need a refill on your cardiac medications before your next appointment, please call your pharmacy*  Lab Work: BMET 1-2 DAYS BEFORE CT If you have labs (blood work) drawn today and your tests are completely normal, you will receive your results only by:  MyChart Message (if you have MyChart) OR A paper copy in the mail If you have any lab test that is abnormal or we need to change your treatment, we will call you to review the results.  Testing/Procedures: CT FOR PULMONARY EMB-SEE BELOW  Follow-Up: At [facility], you and your health needs are our priority.  As part of our continuing mission to provide you with exceptional heart care, we have created designated Provider Care Teams.  These Care Teams include your primary Cardiologist (physician) and Advanced Practice Providers (APPs -  Physician Assistants and Nurse Practitioners) who all work together to provide you with the care you need, when you need it.  We recommend signing up for the patient portal called "MyChart".  Sign up information is provided on this After Visit Summary.  MyChart is used to connect with patients for Virtual Visits (Telemedicine).  Patients are able to view lab/test results, encounter notes, upcoming appointments, etc.  Non-urgent messages can be sent to your provider as well.   To learn more about what you can do with MyChart, go to [email or web address].    Your next appointment:   AFTER CT   The format for your next appointment:   In Person  Provider:   Shelva Majestic, MD    Other Instructions PLEASE [place] [facility] [employer]         Your cardiac CT  will be scheduled at one of the below locations:   [facility] [address] [place], [place] [address] [phone]  [place] [address] [place], [place] [address] [phone]  [facility] [place], [place] [address] [phone]  If scheduled at [facility], please arrive at the [facility] and Children's Entrance (Entrance C2) of [facility] 30 minutes prior to test start time. You can use the FREE valet parking offered at entrance C (encouraged to control the heart rate for the test)  Proceed to the [facility] Radiology Department (first floor) to check-in and test prep.  All radiology patients and guests should use entrance C2 at [facility], accessed from [facility], even though the hospital's physical address listed is [address]    If scheduled at [facility] or [facility], please arrive 15 mins early for check-in and test prep.   Please follow these instructions carefully (unless otherwise directed):  Hold all erectile dysfunction medications at least 3 days (72 hrs) prior to test. (Ie viagra, cialis, sildenafil, tadalafil, etc) We will administer nitroglycerin during this exam.   On the Night Before the Test: Be sure to Drink plenty of water. Do not consume any caffeinated/decaffeinated beverages or chocolate 12 hours prior to your test. Do not take any antihistamines  12 hours prior to your test. If the patient has contrast allergy: Patient will need a prescription for Prednisone and very clear instructions (as follows): Prednisone 50 mg - take 13 hours prior to test Take another Prednisone 50 mg 7 hours prior to test Take another Prednisone 50 mg 1 hour prior to test Take Benadryl 50 mg 1 hour prior to test Patient must complete all four doses of above  prophylactic medications. Patient will need a ride after test due to Benadryl.  On the Day of the Test: Drink plenty of water until 1 hour prior to the test. Do not eat any food 1 hour prior to test. You may take your regular medications prior to the test.  Take metoprolol (Lopressor) two hours prior to test. HOLD Furosemide/Hydrochlorothiazide morning of the test. FEMALES- please wear underwire-free bra if available, avoid dresses & tight clothing     After the Test: Drink plenty of water. After receiving IV contrast, you may experience a mild flushed feeling. This is normal. On occasion, you may experience a mild rash up to 24 hours after the test. This is not dangerous. If this occurs, you can take Benadryl 25 mg and increase your fluid intake. If you experience trouble breathing, this can be serious. If it is severe call 911 IMMEDIATELY. If it is mild, please call our office. If you take any of these medications: Glipizide/Metformin, Avandament, Glucavance, please do not take 48 hours after completing test unless otherwise instructed.  We will call to schedule your test 2-4 weeks out understanding that some insurance companies will need an authorization prior to the service being performed.   For non-scheduling related questions, please contact the cardiac imaging nurse navigator should you have any questions/concerns: Marchia Bond, Cardiac Imaging Nurse Navigator Gordy Clement, Cardiac Imaging Nurse Navigator [facility] Direct Office Dial: [phone]   For scheduling needs, including cancellations and rescheduling, please call [place], [phone].

## 2022-04-15 DIAGNOSIS — M25561 Pain in right knee: Secondary | ICD-10-CM | POA: Diagnosis not present

## 2022-04-15 DIAGNOSIS — R1312 Dysphagia, oropharyngeal phase: Secondary | ICD-10-CM | POA: Diagnosis not present

## 2022-04-15 DIAGNOSIS — S079XXD Crushing injury of head, part unspecified, subsequent encounter: Secondary | ICD-10-CM | POA: Diagnosis not present

## 2022-04-15 DIAGNOSIS — R29898 Other symptoms and signs involving the musculoskeletal system: Secondary | ICD-10-CM | POA: Diagnosis not present

## 2022-04-15 DIAGNOSIS — R2681 Unsteadiness on feet: Secondary | ICD-10-CM | POA: Diagnosis not present

## 2022-04-16 DIAGNOSIS — S079XXD Crushing injury of head, part unspecified, subsequent encounter: Secondary | ICD-10-CM | POA: Diagnosis not present

## 2022-04-16 DIAGNOSIS — R2681 Unsteadiness on feet: Secondary | ICD-10-CM | POA: Diagnosis not present

## 2022-04-16 DIAGNOSIS — R29898 Other symptoms and signs involving the musculoskeletal system: Secondary | ICD-10-CM | POA: Diagnosis not present

## 2022-04-16 DIAGNOSIS — R1312 Dysphagia, oropharyngeal phase: Secondary | ICD-10-CM | POA: Diagnosis not present

## 2022-04-16 DIAGNOSIS — M25561 Pain in right knee: Secondary | ICD-10-CM | POA: Diagnosis not present

## 2022-04-17 ENCOUNTER — Encounter: Payer: Self-pay | Admitting: Cardiovascular Disease

## 2022-04-18 DIAGNOSIS — R1312 Dysphagia, oropharyngeal phase: Secondary | ICD-10-CM | POA: Diagnosis not present

## 2022-04-18 DIAGNOSIS — S079XXD Crushing injury of head, part unspecified, subsequent encounter: Secondary | ICD-10-CM | POA: Diagnosis not present

## 2022-04-18 DIAGNOSIS — M25561 Pain in right knee: Secondary | ICD-10-CM | POA: Diagnosis not present

## 2022-04-18 DIAGNOSIS — R29898 Other symptoms and signs involving the musculoskeletal system: Secondary | ICD-10-CM | POA: Diagnosis not present

## 2022-04-18 DIAGNOSIS — R2681 Unsteadiness on feet: Secondary | ICD-10-CM | POA: Diagnosis not present

## 2022-04-20 DIAGNOSIS — S079XXD Crushing injury of head, part unspecified, subsequent encounter: Secondary | ICD-10-CM | POA: Diagnosis not present

## 2022-04-20 DIAGNOSIS — M25561 Pain in right knee: Secondary | ICD-10-CM | POA: Diagnosis not present

## 2022-04-20 DIAGNOSIS — R29898 Other symptoms and signs involving the musculoskeletal system: Secondary | ICD-10-CM | POA: Diagnosis not present

## 2022-04-20 DIAGNOSIS — I10 Essential (primary) hypertension: Secondary | ICD-10-CM | POA: Diagnosis not present

## 2022-04-20 DIAGNOSIS — R2681 Unsteadiness on feet: Secondary | ICD-10-CM | POA: Diagnosis not present

## 2022-04-20 DIAGNOSIS — R1312 Dysphagia, oropharyngeal phase: Secondary | ICD-10-CM | POA: Diagnosis not present

## 2022-04-20 LAB — COMPREHENSIVE METABOLIC PANEL
Calcium: 9 (ref 8.7–10.7)
eGFR: 96

## 2022-04-20 LAB — BASIC METABOLIC PANEL
BUN: 7 (ref 4–21)
CO2: 26 — AB (ref 13–22)
Chloride: 99 (ref 99–108)
Creatinine: 0.5 — AB (ref 0.6–1.3)
Glucose: 99
Potassium: 4 mEq/L (ref 3.5–5.1)
Sodium: 133 — AB (ref 137–147)

## 2022-04-21 DIAGNOSIS — R2681 Unsteadiness on feet: Secondary | ICD-10-CM | POA: Diagnosis not present

## 2022-04-21 DIAGNOSIS — R29898 Other symptoms and signs involving the musculoskeletal system: Secondary | ICD-10-CM | POA: Diagnosis not present

## 2022-04-21 DIAGNOSIS — R1312 Dysphagia, oropharyngeal phase: Secondary | ICD-10-CM | POA: Diagnosis not present

## 2022-04-21 DIAGNOSIS — S079XXD Crushing injury of head, part unspecified, subsequent encounter: Secondary | ICD-10-CM | POA: Diagnosis not present

## 2022-04-21 DIAGNOSIS — M25561 Pain in right knee: Secondary | ICD-10-CM | POA: Diagnosis not present

## 2022-04-22 ENCOUNTER — Telehealth: Payer: Self-pay | Admitting: Cardiovascular Disease

## 2022-04-22 ENCOUNTER — Other Ambulatory Visit: Payer: Self-pay

## 2022-04-22 DIAGNOSIS — M25561 Pain in right knee: Secondary | ICD-10-CM | POA: Diagnosis not present

## 2022-04-22 DIAGNOSIS — R1312 Dysphagia, oropharyngeal phase: Secondary | ICD-10-CM | POA: Diagnosis not present

## 2022-04-22 DIAGNOSIS — S079XXD Crushing injury of head, part unspecified, subsequent encounter: Secondary | ICD-10-CM | POA: Diagnosis not present

## 2022-04-22 DIAGNOSIS — R2681 Unsteadiness on feet: Secondary | ICD-10-CM | POA: Diagnosis not present

## 2022-04-22 DIAGNOSIS — R29898 Other symptoms and signs involving the musculoskeletal system: Secondary | ICD-10-CM | POA: Diagnosis not present

## 2022-04-22 MED ORDER — DIPHENHYDRAMINE HCL 50 MG PO TABS: 50.0000 mg | ORAL_TABLET | Freq: Once | ORAL | 0 refills | Status: DC

## 2022-04-22 MED ORDER — PREDNISONE 50 MG PO TABS: ORAL_TABLET | ORAL | 0 refills | Status: DC

## 2022-04-22 NOTE — Telephone Encounter (Signed)
Spoke with Shirlean Mylar she lost pt's AVS from last visit. She would like it faxed to heart [phone]. discussed pre-medication with Shirlean Mylar she will read and follow directions on AVS.

## 2022-04-22 NOTE — Telephone Encounter (Signed)
Robin with [facility] calling to speak with Veronda Prude about the patient's discharge paperwork and CT. Phone: [phone]

## 2022-04-23 DIAGNOSIS — S079XXD Crushing injury of head, part unspecified, subsequent encounter: Secondary | ICD-10-CM | POA: Diagnosis not present

## 2022-04-23 DIAGNOSIS — R2681 Unsteadiness on feet: Secondary | ICD-10-CM | POA: Diagnosis not present

## 2022-04-23 DIAGNOSIS — R29898 Other symptoms and signs involving the musculoskeletal system: Secondary | ICD-10-CM | POA: Diagnosis not present

## 2022-04-23 DIAGNOSIS — R1312 Dysphagia, oropharyngeal phase: Secondary | ICD-10-CM | POA: Diagnosis not present

## 2022-04-23 DIAGNOSIS — M25561 Pain in right knee: Secondary | ICD-10-CM | POA: Diagnosis not present

## 2022-04-26 DIAGNOSIS — R29898 Other symptoms and signs involving the musculoskeletal system: Secondary | ICD-10-CM | POA: Diagnosis not present

## 2022-04-26 DIAGNOSIS — M6281 Muscle weakness (generalized): Secondary | ICD-10-CM | POA: Diagnosis not present

## 2022-04-26 DIAGNOSIS — R1312 Dysphagia, oropharyngeal phase: Secondary | ICD-10-CM | POA: Diagnosis not present

## 2022-04-26 DIAGNOSIS — M25561 Pain in right knee: Secondary | ICD-10-CM | POA: Diagnosis not present

## 2022-04-26 DIAGNOSIS — R2681 Unsteadiness on feet: Secondary | ICD-10-CM | POA: Diagnosis not present

## 2022-04-27 DIAGNOSIS — M25561 Pain in right knee: Secondary | ICD-10-CM | POA: Diagnosis not present

## 2022-04-27 DIAGNOSIS — R29898 Other symptoms and signs involving the musculoskeletal system: Secondary | ICD-10-CM | POA: Diagnosis not present

## 2022-04-27 DIAGNOSIS — R1312 Dysphagia, oropharyngeal phase: Secondary | ICD-10-CM | POA: Diagnosis not present

## 2022-04-27 DIAGNOSIS — R2681 Unsteadiness on feet: Secondary | ICD-10-CM | POA: Diagnosis not present

## 2022-04-27 DIAGNOSIS — M25521 Pain in right elbow: Secondary | ICD-10-CM | POA: Diagnosis not present

## 2022-04-27 DIAGNOSIS — M6281 Muscle weakness (generalized): Secondary | ICD-10-CM | POA: Diagnosis not present

## 2022-04-28 DIAGNOSIS — M25561 Pain in right knee: Secondary | ICD-10-CM | POA: Diagnosis not present

## 2022-04-28 DIAGNOSIS — R29898 Other symptoms and signs involving the musculoskeletal system: Secondary | ICD-10-CM | POA: Diagnosis not present

## 2022-04-28 DIAGNOSIS — M6281 Muscle weakness (generalized): Secondary | ICD-10-CM | POA: Diagnosis not present

## 2022-04-28 DIAGNOSIS — R2681 Unsteadiness on feet: Secondary | ICD-10-CM | POA: Diagnosis not present

## 2022-04-28 DIAGNOSIS — R1312 Dysphagia, oropharyngeal phase: Secondary | ICD-10-CM | POA: Diagnosis not present

## 2022-04-29 DIAGNOSIS — R1312 Dysphagia, oropharyngeal phase: Secondary | ICD-10-CM | POA: Diagnosis not present

## 2022-04-29 DIAGNOSIS — R2681 Unsteadiness on feet: Secondary | ICD-10-CM | POA: Diagnosis not present

## 2022-04-29 DIAGNOSIS — R29898 Other symptoms and signs involving the musculoskeletal system: Secondary | ICD-10-CM | POA: Diagnosis not present

## 2022-04-29 DIAGNOSIS — M25561 Pain in right knee: Secondary | ICD-10-CM | POA: Diagnosis not present

## 2022-04-29 DIAGNOSIS — M6281 Muscle weakness (generalized): Secondary | ICD-10-CM | POA: Diagnosis not present

## 2022-04-30 DIAGNOSIS — R1312 Dysphagia, oropharyngeal phase: Secondary | ICD-10-CM | POA: Diagnosis not present

## 2022-04-30 DIAGNOSIS — M25561 Pain in right knee: Secondary | ICD-10-CM | POA: Diagnosis not present

## 2022-04-30 DIAGNOSIS — R29898 Other symptoms and signs involving the musculoskeletal system: Secondary | ICD-10-CM | POA: Diagnosis not present

## 2022-04-30 DIAGNOSIS — M6281 Muscle weakness (generalized): Secondary | ICD-10-CM | POA: Diagnosis not present

## 2022-04-30 DIAGNOSIS — R2681 Unsteadiness on feet: Secondary | ICD-10-CM | POA: Diagnosis not present

## 2022-05-03 ENCOUNTER — Non-Acute Institutional Stay (SKILLED_NURSING_FACILITY): Payer: Medicare Other | Admitting: Nurse Practitioner

## 2022-05-03 ENCOUNTER — Encounter: Payer: Self-pay | Admitting: Nurse Practitioner

## 2022-05-03 ENCOUNTER — Telehealth: Payer: Self-pay | Admitting: Cardiovascular Disease

## 2022-05-03 DIAGNOSIS — R29898 Other symptoms and signs involving the musculoskeletal system: Secondary | ICD-10-CM | POA: Diagnosis not present

## 2022-05-03 DIAGNOSIS — K219 Gastro-esophageal reflux disease without esophagitis: Secondary | ICD-10-CM | POA: Diagnosis not present

## 2022-05-03 DIAGNOSIS — S42401A Unspecified fracture of lower end of right humerus, initial encounter for closed fracture: Secondary | ICD-10-CM

## 2022-05-03 DIAGNOSIS — I5189 Other ill-defined heart diseases: Secondary | ICD-10-CM | POA: Diagnosis not present

## 2022-05-03 DIAGNOSIS — E785 Hyperlipidemia, unspecified: Secondary | ICD-10-CM

## 2022-05-03 DIAGNOSIS — M25561 Pain in right knee: Secondary | ICD-10-CM | POA: Diagnosis not present

## 2022-05-03 DIAGNOSIS — I480 Paroxysmal atrial fibrillation: Secondary | ICD-10-CM

## 2022-05-03 DIAGNOSIS — E871 Hypo-osmolality and hyponatremia: Secondary | ICD-10-CM

## 2022-05-03 DIAGNOSIS — I10 Essential (primary) hypertension: Secondary | ICD-10-CM

## 2022-05-03 DIAGNOSIS — R1312 Dysphagia, oropharyngeal phase: Secondary | ICD-10-CM | POA: Diagnosis not present

## 2022-05-03 DIAGNOSIS — G609 Hereditary and idiopathic neuropathy, unspecified: Secondary | ICD-10-CM

## 2022-05-03 DIAGNOSIS — I2510 Atherosclerotic heart disease of native coronary artery without angina pectoris: Secondary | ICD-10-CM | POA: Diagnosis not present

## 2022-05-03 DIAGNOSIS — M6281 Muscle weakness (generalized): Secondary | ICD-10-CM | POA: Diagnosis not present

## 2022-05-03 DIAGNOSIS — E876 Hypokalemia: Secondary | ICD-10-CM | POA: Diagnosis not present

## 2022-05-03 DIAGNOSIS — Z9861 Coronary angioplasty status: Secondary | ICD-10-CM

## 2022-05-03 DIAGNOSIS — R2681 Unsteadiness on feet: Secondary | ICD-10-CM | POA: Diagnosis not present

## 2022-05-03 DIAGNOSIS — Z86711 Personal history of pulmonary embolism: Secondary | ICD-10-CM

## 2022-05-03 NOTE — Assessment & Plan Note (Signed)
on Eliquis, Metoprolol

## 2022-05-03 NOTE — Assessment & Plan Note (Signed)
on Gabapentin  

## 2022-05-03 NOTE — Assessment & Plan Note (Signed)
thing liquid, hx of dilatation,  takes Pantoprazole, Hgb 12.7 03/30/22 

## 2022-05-03 NOTE — Telephone Encounter (Signed)
Pt's daughter would like a callback regarding upcoming CT appt. Daughter states that pt broke his elbow and has questions regarding CT. Please advise

## 2022-05-03 NOTE — Progress Notes (Signed)
Location:  [facility] Room Number: NO/65/A Place of Service:  Nursing (32) Provider:  Eliasar Hlavaty X, NP  Laurey Morale, MD  Patient Care Team: Laurey Morale, MD as PCP - General [place] [place] Joyice Faster, MD as PCP - Cardiology (Cardiology) Clent Jacks, MD as Consulting Physician (Ophthalmology) Zadie Rhine Clent Demark, MD as Consulting Physician (Ophthalmology)  Extended Emergency Contact Information Primary Emergency Contact: Derenda Fennel [place] of [place] Phone: [phone] Mobile Phone: [phone] Relation: Daughter Secondary Emergency Contact: Horatio Pel [place] of [place] Phone: [phone] Relation: Daughter  Code Status:  FULL Goals of care: Advanced Directive information    05/03/2022   12:35 PM  Advanced Directives  Does Patient Have a Medical Advance Directive? Yes  Type of Paramedic of [place];Living will  Does patient want to make changes to medical advance directive? No - Patient declined  Copy of [place] in Chart? Yes - validated most recent copy scanned in chart (See row information)     Chief Complaint  Patient presents with   Medical Management of Chronic Issues    Patient is here for a follow up for chronic conditions    Quality Metric Gaps    Patient is due for annual wellness visit and hemoglobin A1C    HPI:  Pt is a 89 y.o. male seen today for medical management of chronic diseases.      03/24/23 right elbow fx(distal end of right humerus), in cast, mild swelling fingers, pain is well controlled.              CAD ST elevation MI LAD 05/2021, underwent Cath, stenting, s/p percutaneous coronary angioplasty, started DAPT, Echo EF 40-45%,  65-70% 11/02/21, started Plavix 12/24/21 per [facility]             PE 11/02/21, started Eliquis, CTA 05/12/22             Grade 2 diastolic dysfunction, compensated             Afib, on Eliquis, Metoprolol             GERD, thing  liquid, hx of dilatation,  takes Pantoprazole, Hgb 12.7 03/30/22             Hypokalemia, on Kcl K 4.4 03/30/22             Hyponatremia, Na 131 03/30/22             HLD on Atorvastatin             HTN, on Metoprolol             Peripheral neuropathy, on Gabapentin  Past Medical History:  Diagnosis Date   Arthritis    Diverticulosis of colon    External thrombosed hemorrhoids 06/11/2009   Qualifier: Diagnosis of  By: Sarajane Jews MD, Ishmael Holter    GERD (gastroesophageal reflux disease)    Hypertension    Past Surgical History:  Procedure Laterality Date   cataract extaction, right  6/09   per Dr. [name]   CATARACT EXTRACTION Left    COLONOSCOPY  09/23/04   per Dr. [name], clear, no repeats needed    CORONARY STENT INTERVENTION N/A 06/06/2021   Procedure: CORONARY STENT INTERVENTION;  Surgeon: [name], MD;  Location: [facility];  Service: Cardiovascular;  Laterality: N/A;   CORONARY/GRAFT ACUTE MI REVASCULARIZATION N/A 06/06/2021   Procedure: Coronary/Graft Acute MI Revascularization;  Surgeon: [name], MD;  Location: [facility];  Service: Cardiovascular;  Laterality: N/A;   EGD and esophageal diatation,  05-10-11   per Dr. [name]     HERNIA REPAIR     LEFT HEART CATH AND CORONARY ANGIOGRAPHY N/A 06/06/2021   Procedure: LEFT HEART CATH AND CORONARY ANGIOGRAPHY;  Surgeon: [name], MD;  Location: [facility];  Service: Cardiovascular;  Laterality: N/A;   rt inguinal hernia     x2    Allergies  Allergen Reactions   Contrast Media [Iodinated Contrast Media] Rash    Outpatient Encounter Medications as of 05/03/2022  Medication Sig   acetaminophen (TYLENOL) 325 MG tablet Take 650 mg by mouth 2 (two) times daily.   apixaban (ELIQUIS) 5 MG TABS tablet Take 1 tablet (5 mg total) by mouth 2 (two) times daily.   atorvastatin (LIPITOR) 40 MG tablet Take 1 tablet (40 mg total) by mouth daily.   clopidogrel (PLAVIX) 75 MG tablet Take 1 tablet (75 mg total)  by mouth daily. Take 2 tablets (150 mg) on the first day (12/24/21)   diphenhydrAMINE (BENADRYL) 50 MG tablet Take 1 tablet (50 mg total) by mouth at bedtime as needed for itching.   docusate sodium (COLACE) 100 MG capsule Take 100 mg by mouth 2 (two) times daily.   gabapentin (NEURONTIN) 300 MG capsule TAKE 1 CAPSULE BY MOUTH THREE TIMES A DAY   metoprolol tartrate (LOPRESSOR) 25 MG tablet Take 1 tablet (25 mg) by mouth in the AM, then take 0.5 tablet (12.5 mg) by mouth in the PM.   nitroGLYCERIN (NITROSTAT) 0.4 MG SL tablet Place 1 tablet (0.4 mg total) under the tongue every 5 (five) minutes as needed for chest pain.   pantoprazole (PROTONIX) 40 MG tablet Take 1 tablet (40 mg total) by mouth daily.   potassium chloride (KLOR-CON 10) 10 MEQ tablet Take 1 tablet (10 mEq total) by mouth daily.   predniSONE (DELTASONE) 50 MG tablet Prednisone 50 mg - take 13 hours prior to test; Take another Prednisone 50 mg 7 hours prior to test; Take another Prednisone 50 mg 1 hour prior to test Take Benadryl 50 mg 1 hour prior to test.  Must complete all four doses of above prophylactic medications.   traMADol (ULTRAM) 50 MG tablet Take 1 tablet (50 mg total) by mouth daily.   traMADol (ULTRAM) 50 MG tablet Take 1 tablet (50 mg total) by mouth 2 (two) times daily as needed.   vitamin C (ASCORBIC ACID) 500 MG tablet Take 500 mg by mouth daily. Reported on 10/27/2015   diphenhydrAMINE (BENADRYL) 50 MG tablet Take 1 tablet (50 mg total) by mouth once for 1 dose. Take 1 hour prior to test   No facility-administered encounter medications on file as of 05/03/2022.    Review of Systems  Constitutional:  Negative for appetite change, fatigue and fever.  HENT:  Negative for congestion and trouble swallowing.   Eyes:  Negative for visual disturbance.  Respiratory:  Negative for cough, chest tightness, shortness of breath, wheezing and stridor.   Cardiovascular:  Negative for chest pain, palpitations and leg swelling.   Gastrointestinal:  Negative for abdominal pain and constipation.  Genitourinary:  Negative for difficulty urinating, frequency and urgency.       Incontinent of urine  Musculoskeletal:  Positive for arthralgias, gait problem and joint swelling. Negative for neck pain.  Skin:  Negative for color change.  Neurological:  Negative for speech difficulty, weakness and headaches.  Hematological:  Bruises/bleeds easily.  Psychiatric/Behavioral:  Negative for confusion and sleep disturbance. The patient is not nervous/anxious.     Immunization History  Administered Date(s) Administered   Fluad Quad(high Dose 65+) 02/15/2022   Influenza Split 01/20/2012, 01/07/2013   Influenza Whole 02/09/2006, 01/11/2008   Influenza, High Dose Seasonal PF 12/30/2016, 12/27/2017, 01/08/2019   Influenza,inj,quad, With Preservative 12/29/2018   Influenza-Unspecified 01/17/2014, 01/14/2016, 12/30/2016   PFIZER Comirnaty(Gray Top)Covid-19 Tri-Sucrose Vaccine 05/28/2019, 03/04/2020, 09/23/2020   Pfizer Covid-19 Vaccine Bivalent Booster 71yr & up 02/25/2022   Pneumococcal Conjugate-13 09/23/2015   Pneumococcal Polysaccharide-23 03/13/2008   Td 12/20/2007   Tdap 07/06/2021   Unspecified SARS-COV-2 Vaccination 04/30/2019   Zoster Recombinat (Shingrix) 03/11/2020, 06/20/2020   Pertinent  Health Maintenance Due  Topic Date Due   HEMOGLOBIN A1C  04/15/2022   OPHTHALMOLOGY EXAM  10/24/2023 (Originally 10/24/2018)   FOOT EXAM  03/09/2023   INFLUENZA VACCINE  Completed      11/04/2021    8:26 PM 11/05/2021   10:00 AM 03/25/2022    4:16 PM 03/31/2022    2:32 PM 05/03/2022   12:35 PM  Fall Risk  Falls in the past year?   '1 1 1  '$ Was there an injury with Fall?   '1 1 1  '$ Fall Risk Category Calculator   '3 3 3  '$ Fall Risk Category   High High High  Patient Fall Risk Level High fall risk High fall risk High fall risk High fall risk High fall risk  Patient at Risk for Falls Due to   History of fall(s);Impaired  balance/gait;Impaired vision History of fall(s);Impaired balance/gait;Impaired vision History of fall(s);Impaired balance/gait;Impaired mobility  Fall risk Follow up   Falls evaluation completed Falls evaluation completed Falls evaluation completed   Functional Status Survey:    Vitals:   05/03/22 1239  BP: (!) 156/62  Pulse: 66  Resp: 18  Temp: (!) 97.5 F (36.4 C)  SpO2: 97%  Weight: 155 lb 14.4 oz (70.7 kg)  Height: '5\' 7"'$  (1.702 m)   Body mass index is 24.42 kg/m. Physical Exam Vitals and nursing note reviewed.  Constitutional:      Appearance: Normal appearance.  HENT:     Head: Normocephalic.     Comments: Right orbital contusion, sutures are intact, no active bleeding or s/s of infection.     Nose: Nose normal.     Mouth/Throat:     Mouth: Mucous membranes are moist.  Eyes:     Extraocular Movements: Extraocular movements intact.     Conjunctiva/sclera: Conjunctivae normal.     Pupils: Pupils are equal, round, and reactive to light.  Cardiovascular:     Rate and Rhythm: Normal rate and regular rhythm.     Heart sounds: No murmur heard. Pulmonary:     Effort: Pulmonary effort is normal.     Breath sounds: Normal breath sounds. No rales.  Abdominal:     General: Bowel sounds are normal.     Palpations: Abdomen is soft.     Tenderness: There is no abdominal tenderness.  Musculoskeletal:        General: Swelling present.     Cervical back: Normal range of motion and neck supple.     Comments: Right elbow in cast, right fingers mild swelling, able to make a fist, denied pain or numbness.   Skin:    General: Skin is warm and dry.     Findings: Bruising present.     Comments: Bruising R elbow, R orbital.   Neurological:     General:  No focal deficit present.     Mental Status: He is alert and oriented to person, place, and time. Mental status is at baseline.     Gait: Gait abnormal.  Psychiatric:        Mood and Affect: Mood normal.        Behavior: Behavior  normal.        Thought Content: Thought content normal.     Labs reviewed: Recent Labs    11/02/21 1640 11/02/21 1648 11/02/21 2130 11/03/21 0537 11/04/21 0548 03/23/22 2255 03/30/22 0000  NA 132*  --  139 133*  132* 134* 132* 131*  K 3.7  --  2.4* 4.1  4.0 3.8 4.1 4.4  CL 105  --  120* 104  104 108 101 98*  CO2 18*  --  12* 21*  21* 19* 23 26*  GLUCOSE 116*  --  97 155*  162* 154* 148*  --   BUN 18  --  '11 16  16 16 16 13  '$ CREATININE 0.72  --  0.36* 0.51*  0.52* 0.51* 0.77 0.5*  CALCIUM 8.5*  --  5.6* 8.3*  8.3* 8.0* 8.5* 8.5*  MG 1.9  --  1.3* 1.9  --   --   --   PHOS  --  3.3  --  4.4  --   --   --    Recent Labs    11/02/21 2130 11/03/21 0537 11/04/21 0548  AST 45* 61* 63*  ALT 24 39 37  ALKPHOS 52 83 74  BILITOT 1.3* 1.5* 0.8  PROT 3.7* 5.9* 5.2*  ALBUMIN 1.8* 3.1* 2.7*   Recent Labs    11/02/21 1640 11/03/21 0537 11/03/21 1737 11/04/21 0548 11/05/21 0450 03/23/22 2255 03/30/22 0000  WBC 7.0 3.8*   < > 9.7 9.0 5.0 7.2  NEUTROABS 4.7 2.9  --   --   --  3.3  --   HGB 12.3* 12.7*   < > 11.4* 11.1* 12.8* 12.7*  HCT 35.9* 37.0*   < > 33.3* 32.7* 38.5* 37*  MCV 93.5 92.7   < > 93.0 94.0 94.1  --   PLT 177 163   < > 157 162 216 356   < > = values in this interval not displayed.   Lab Results  Component Value Date   TSH 2.039 11/02/2021   Lab Results  Component Value Date   HGBA1C 5.5 10/14/2021   Lab Results  Component Value Date   CHOL 93 10/14/2021   HDL 45.60 10/14/2021   LDLCALC 33 10/14/2021   LDLDIRECT 72.0 09/27/2017   TRIG 71.0 10/14/2021   CHOLHDL 2 10/14/2021    Significant Diagnostic Results in last 30 days:  No results found.  Assessment/Plan Essential hypertension Blood pressure is controlled,  on Metoprolol  Neuropathy, peripheral on Gabapentin  Hyperlipidemia on Atorvastatin  Hyponatremia  Na 131 03/30/22  Hypokalemia on Kcl K 4.4 03/30/22  GERD thing liquid, hx of dilatation,  takes Pantoprazole, Hgb 12.7  03/30/22  Paroxysmal atrial fibrillation (HCC) on Eliquis, Metoprolol  Diastolic dysfunction compensated  History of pulmonary embolism   PE 11/02/21, started Eliquis, CTA 05/12/22  CAD S/P percutaneous coronary angioplasty CAD ST elevation MI LAD 05/2021, underwent Cath, stenting, s/p percutaneous coronary angioplasty, started DAPT, Echo EF 40-45%,  65-70% 11/02/21, started Plavix 12/24/21 per [facility]  Fracture of distal humerus 03/24/23 fall right elbow contusion fx 03/29/22 Xray acute comminuted fracture of the distal humerus, has ortho evaluation (distal end of right humerus),  in cast, mild swelling fingers, pain is well controlled.      Family/ staff Communication: plan of care reviewed with the patient and charge nurse.   Labs/tests ordered:  none  Time spend 35 minutes.

## 2022-05-03 NOTE — Assessment & Plan Note (Signed)
on Atorvastatin 

## 2022-05-03 NOTE — Assessment & Plan Note (Signed)
Blood pressure is controlled, on Metoprolol 

## 2022-05-03 NOTE — Assessment & Plan Note (Addendum)
PE 11/02/21, started Eliquis, CTA 05/12/22 

## 2022-05-03 NOTE — Assessment & Plan Note (Signed)
CAD ST elevation MI LAD 05/2021, underwent Cath, stenting, s/p percutaneous coronary angioplasty, started DAPT, Echo EF 40-45%,  65-70% 11/02/21, started Plavix 12/24/21 per [facility]

## 2022-05-03 NOTE — Assessment & Plan Note (Signed)
compensated 

## 2022-05-03 NOTE — Telephone Encounter (Signed)
Called patient daughter, advised that patient fell and broke his elbow, he has a removable cast- and they are wanting to try to complete the CT scan, so they can follow up with Dr.Kelly- I advised they can try and see if they are able to get the scan needed, if not they will notify us-   Patient daughter verbalized understanding, thankful for call back.

## 2022-05-03 NOTE — Assessment & Plan Note (Signed)
Na 131 03/30/22

## 2022-05-03 NOTE — Assessment & Plan Note (Addendum)
03/24/23 fall right elbow contusion fx 03/29/22 Xray acute comminuted fracture of the distal humerus, has ortho evaluation (distal end of right humerus), in cast, mild swelling fingers, pain is well controlled.

## 2022-05-03 NOTE — Assessment & Plan Note (Signed)
on Kcl K 4.4 03/30/22

## 2022-05-04 ENCOUNTER — Encounter: Payer: Self-pay | Admitting: Nurse Practitioner

## 2022-05-04 ENCOUNTER — Non-Acute Institutional Stay (SKILLED_NURSING_FACILITY): Payer: Medicare Other | Admitting: Nurse Practitioner

## 2022-05-04 DIAGNOSIS — R1312 Dysphagia, oropharyngeal phase: Secondary | ICD-10-CM | POA: Diagnosis not present

## 2022-05-04 DIAGNOSIS — M6281 Muscle weakness (generalized): Secondary | ICD-10-CM | POA: Diagnosis not present

## 2022-05-04 DIAGNOSIS — R2681 Unsteadiness on feet: Secondary | ICD-10-CM | POA: Diagnosis not present

## 2022-05-04 DIAGNOSIS — M25561 Pain in right knee: Secondary | ICD-10-CM | POA: Diagnosis not present

## 2022-05-04 DIAGNOSIS — R29898 Other symptoms and signs involving the musculoskeletal system: Secondary | ICD-10-CM | POA: Diagnosis not present

## 2022-05-04 DIAGNOSIS — Z0000 Encounter for general adult medical examination without abnormal findings: Secondary | ICD-10-CM | POA: Diagnosis not present

## 2022-05-05 DIAGNOSIS — R1312 Dysphagia, oropharyngeal phase: Secondary | ICD-10-CM | POA: Diagnosis not present

## 2022-05-05 DIAGNOSIS — M25561 Pain in right knee: Secondary | ICD-10-CM | POA: Diagnosis not present

## 2022-05-05 DIAGNOSIS — R2681 Unsteadiness on feet: Secondary | ICD-10-CM | POA: Diagnosis not present

## 2022-05-05 DIAGNOSIS — M6281 Muscle weakness (generalized): Secondary | ICD-10-CM | POA: Diagnosis not present

## 2022-05-05 DIAGNOSIS — R29898 Other symptoms and signs involving the musculoskeletal system: Secondary | ICD-10-CM | POA: Diagnosis not present

## 2022-05-06 ENCOUNTER — Encounter: Payer: Self-pay | Admitting: Nurse Practitioner

## 2022-05-06 DIAGNOSIS — R2681 Unsteadiness on feet: Secondary | ICD-10-CM | POA: Diagnosis not present

## 2022-05-06 DIAGNOSIS — R1312 Dysphagia, oropharyngeal phase: Secondary | ICD-10-CM | POA: Diagnosis not present

## 2022-05-06 DIAGNOSIS — M25561 Pain in right knee: Secondary | ICD-10-CM | POA: Diagnosis not present

## 2022-05-06 DIAGNOSIS — R29898 Other symptoms and signs involving the musculoskeletal system: Secondary | ICD-10-CM | POA: Diagnosis not present

## 2022-05-06 DIAGNOSIS — M6281 Muscle weakness (generalized): Secondary | ICD-10-CM | POA: Diagnosis not present

## 2022-05-06 NOTE — Progress Notes (Signed)
Subjective:   Gabriel [name] is a 89 y.o. male who presents for Medicare Annual/Subsequent preventive examination @ [facility].  Cardiac Risk Factors include: advanced age (>79mn, >>49women);dyslipidemia;male gender;hypertension     Objective:    Today's Vitals   05/04/22 1445 05/06/22 1053  BP: (!) 156/62   Pulse: 66   Resp: 18   Temp: (!) 97.5 F (36.4 C)   SpO2: 97%   Weight: 155 lb (70.3 kg)   Height: '5\' 7"'$  (1.702 m)   PainSc:  2    Body mass index is 24.28 kg/m.     05/04/2022    2:44 PM 05/03/2022   12:35 PM 03/31/2022    2:34 PM 11/02/2021   11:00 PM 06/22/2021    3:10 PM 06/07/2021   12:00 AM 11/28/2020   10:38 AM  Advanced Directives  Does Patient Have a Medical Advance Directive? Yes Yes Yes Yes No No No  Type of AParamedicof ABothell WestLiving will HDotseroLiving will [place]taffordLiving will Living will     Does patient want to make changes to medical advance directive? No - Patient declined No - Patient declined No - Patient declined No - Patient declined     Copy of HCherry Hillin Chart? Yes - validated most recent copy scanned in chart (See row information) Yes - validated most recent copy scanned in chart (See row information) Yes - validated most recent copy scanned in chart (See row information)      Would patient like information on creating a medical advance directive?     No - Patient declined No - Patient declined     Current Medications (verified) Outpatient Encounter Medications as of 05/04/2022  Medication Sig   acetaminophen (TYLENOL) 325 MG tablet Take 650 mg by mouth 2 (two) times daily.   apixaban (ELIQUIS) 5 MG TABS tablet Take 1 tablet (5 mg total) by mouth 2 (two) times daily.   atorvastatin (LIPITOR) 40 MG tablet Take 1 tablet (40 mg total) by mouth daily.   clopidogrel (PLAVIX) 75 MG tablet Take 1 tablet (75 mg total) by mouth daily. Take 2 tablets (150  mg) on the first day (12/24/21)   diphenhydrAMINE (BENADRYL) 50 MG tablet Take 1 tablet (50 mg total) by mouth at bedtime as needed for itching.   docusate sodium (COLACE) 100 MG capsule Take 100 mg by mouth 2 (two) times daily.   gabapentin (NEURONTIN) 300 MG capsule TAKE 1 CAPSULE BY MOUTH THREE TIMES A DAY   metoprolol tartrate (LOPRESSOR) 25 MG tablet Take 1 tablet (25 mg) by mouth in the AM, then take 0.5 tablet (12.5 mg) by mouth in the PM.   nitroGLYCERIN (NITROSTAT) 0.4 MG SL tablet Place 1 tablet (0.4 mg total) under the tongue every 5 (five) minutes as needed for chest pain.   pantoprazole (PROTONIX) 40 MG tablet Take 1 tablet (40 mg total) by mouth daily.   potassium chloride (KLOR-CON 10) 10 MEQ tablet Take 1 tablet (10 mEq total) by mouth daily.   predniSONE (DELTASONE) 50 MG tablet Prednisone 50 mg - take 13 hours prior to test; Take another Prednisone 50 mg 7 hours prior to test; Take another Prednisone 50 mg 1 hour prior to test Take Benadryl 50 mg 1 hour prior to test.  Must complete all four doses of above prophylactic medications.   traMADol (ULTRAM) 50 MG tablet Take 1 tablet (50 mg total) by mouth daily.  traMADol (ULTRAM) 50 MG tablet Take 1 tablet (50 mg total) by mouth 2 (two) times daily as needed.   vitamin C (ASCORBIC ACID) 500 MG tablet Take 500 mg by mouth daily. Reported on 10/27/2015   diphenhydrAMINE (BENADRYL) 50 MG tablet Take 1 tablet (50 mg total) by mouth once for 1 dose. Take 1 hour prior to test   No facility-administered encounter medications on file as of 05/04/2022.    Allergies (verified) Contrast media [iodinated contrast media]   History: Past Medical History:  Diagnosis Date   Arthritis    Diverticulosis of colon    External thrombosed hemorrhoids 06/11/2009   Qualifier: Diagnosis of  By: Sarajane Jews MD, Ishmael Holter    GERD (gastroesophageal reflux disease)    Hypertension    Past Surgical History:  Procedure Laterality Date   cataract extaction, right   6/09   per Dr. [name]   CATARACT EXTRACTION Left    COLONOSCOPY  09/23/04   per Dr. [name], clear, no repeats needed    CORONARY STENT INTERVENTION N/A 06/06/2021   Procedure: CORONARY STENT INTERVENTION;  Surgeon: [name], MD;  Location: [facility];  Service: Cardiovascular;  Laterality: N/A;   CORONARY/GRAFT ACUTE MI REVASCULARIZATION N/A 06/06/2021   Procedure: Coronary/Graft Acute MI Revascularization;  Surgeon: [name], MD;  Location: [facility];  Service: Cardiovascular;  Laterality: N/A;   EGD and esophageal diatation,  05-10-11   per Dr. [name]     HERNIA REPAIR     LEFT HEART CATH AND CORONARY ANGIOGRAPHY N/A 06/06/2021   Procedure: LEFT HEART CATH AND CORONARY ANGIOGRAPHY;  Surgeon: [name], MD;  Location: [facility];  Service: Cardiovascular;  Laterality: N/A;   rt inguinal hernia     x2   Family History  Problem Relation Age of Onset   Coronary artery disease Father    Colon cancer Neg Hx    Stomach cancer Neg Hx    Esophageal cancer Neg Hx    Social History   Socioeconomic History   Marital status: Widowed    Spouse name: Not on file   Number of children: 3   Years of education: Not on file   Highest education level: Not on file  Occupational History   Occupation: retired  Tobacco Use   Smoking status: Former    Packs/day: 15.00    Years: 0.50    Total pack years: 7.50    Types: Cigarettes   Smokeless tobacco: Never   Tobacco comments:    smoked 18 and stopped and states he stopped 30's   Vaping Use   Vaping Use: Never used  Substance and Sexual Activity   Alcohol use: No    Alcohol/week: 0.0 standard drinks of alcohol   Drug use: No   Sexual activity: Never    Birth control/protection: Abstinence  Other Topics Concern   Not on file  Social History Narrative   Lives in studio at [place] since 2009, was married x 57 years.    3 daughters.    Social Determinants of Health   Financial  Resource Strain: Low Risk  (06/22/2021)   Overall Financial Resource Strain (CARDIA)    Difficulty of Paying Living Expenses: Not hard at all  Food Insecurity: No Food Insecurity (06/22/2021)   Hunger Vital Sign    Worried About Running Out of Food in the Last Year: Never true    Ran Out of Food in the Last Year: Never true  Transportation Needs: No Transportation Needs (06/22/2021)   PRAPARE - Hydrologist (Medical): No    Lack of Transportation (Non-Medical): No  Physical Activity: Insufficiently Active (06/22/2021)   Exercise Vital Sign    Days of Exercise per Week: 3 days    Minutes of Exercise per Session: 30 min  Stress: No Stress Concern Present (06/22/2021)   [place]    Feeling of Stress : Only a little  Social Connections: Moderately Integrated (06/22/2021)   Social Connection and Isolation Panel [NHANES]    Frequency of Communication with Friends and Family: More than three times a week    Frequency of Social Gatherings with Friends and Family: More than three times a week    Attends Religious Services: 1 to 4 times per year    Active Member of [employer] or Organizations: No    Attends Archivist Meetings: 1 to 4 times per year    Marital Status: Widowed    Tobacco Counseling Counseling given: Not Answered Tobacco comments: smoked 18 and stopped and states he stopped 30's    Clinical Intake:  Pre-visit preparation completed: Yes  Pain : 0-10 Pain Score: 2  Pain Type: Acute pain Pain Location: Elbow Pain Orientation: Right Pain Descriptors / Indicators: Aching, Dull, Discomfort, Nagging, Sore, Tender Pain Onset: 1 to 4 weeks ago Pain Frequency: Several days a week Pain Relieving Factors: splint, pain meds. Effect of Pain on Daily Activities: needs assistance wiht transfer, ALDs  Pain Relieving Factors: splint, pain meds.  BMI - recorded: 24.28 Nutritional  Status: BMI of 19-24  Normal Diabetes: No  How often do you need to have someone help you when you read instructions, pamphlets, or other written materials from your doctor or pharmacy?: 3 - Sometimes What is the last grade level you completed in school?: college  Diabetic?no  Interpreter Needed?: No  Information entered by :: Avonell Lenig Bretta Bang NP   Activities of Daily Living    05/06/2022   10:57 AM 11/02/2021   11:00 PM  In your present state of health, do you have any difficulty performing the following activities:  Hearing? 1 1  Comment hearing aids   Vision? 0 0  Difficulty concentrating or making decisions? 0 0  Walking or climbing stairs? 1 1  Dressing or bathing? 1 1  Doing errands, shopping? 1 1  Preparing Food and eating ? N   Using the Toilet? Y   In the past six months, have you accidently leaked urine? Y   Do you have problems with loss of bowel control? N   Managing your Medications? Y   Managing your Finances? Y   Housekeeping or managing your Housekeeping? Y     Patient Care Team: Laurey Morale, MD as PCP - General Claiborne Billings Joyice Faster, MD as PCP - Cardiology (Cardiology) Clent Jacks, MD as Consulting Physician (Ophthalmology) Zadie Rhine Clent Demark, MD as Consulting Physician (Ophthalmology)  Indicate any recent Medical Services you may have received from other than [facility] providers in the past year (date may be approximate).     Assessment:   This is a routine wellness examination for [place].  Hearing/Vision screen No results found.  Dietary issues and exercise activities discussed: Current Exercise Habits: The patient does not participate in regular exercise at present, Exercise limited by: cardiac condition(s);orthopedic condition(s)   Goals Addressed             This Visit's Progress  LIFESTYLE - DECREASE FALLS RISK         Depression Screen    03/31/2022    2:31 PM 10/14/2021   10:56 AM 06/23/2021    1:56 PM 06/22/2021    3:03 PM 06/19/2020    11:52 AM 05/30/2019   12:49 PM 09/29/2018    9:41 AM  PHQ 2/9 Scores  PHQ - 2 Score 0 0 1 0 0 0 0  PHQ- 9 Score  0 1        Fall Risk    05/04/2022    2:44 PM 05/03/2022   12:35 PM 03/31/2022    2:32 PM 03/25/2022    4:16 PM 10/14/2021   10:55 AM  Fall Risk   Falls in the past year? '1 1 1 1 '$ 0  Number falls in past yr: '1 1 1 1 '$ 0  Injury with Fall? '1 1 1 1 '$ 0  Risk for fall due to : History of fall(s);Impaired balance/gait;Impaired mobility;Impaired vision History of fall(s);Impaired balance/gait;Impaired mobility History of fall(s);Impaired balance/gait;Impaired vision History of fall(s);Impaired balance/gait;Impaired vision No Fall Risks  Follow up Falls evaluation completed Falls evaluation completed Falls evaluation completed Falls evaluation completed Falls evaluation completed    [place]:  Any stairs in or around the home? Yes  If so, are there any without handrails? No  Home free of loose throw rugs in walkways, pet beds, electrical cords, etc? Yes  Adequate lighting in your home to reduce risk of falls? Yes   ASSISTIVE DEVICES UTILIZED TO PREVENT FALLS:  Life alert? No  Use of a cane, walker or w/c? Yes  Grab bars in the bathroom? Yes  Shower chair or bench in shower? Yes  Elevated toilet seat or a handicapped toilet? Yes   TIMED UP AND GO:  Was the test performed? Yes .  Length of time to ambulate 10 feet: 15 sec.   Gait slow and steady with assistive device  Cognitive Function:    10/20/2016   10:32 AM 10/17/2015   10:58 AM  MMSE - Mini Mental State Exam  Orientation to time 4 5  Orientation to Place 5 5  Registration 3 3  Attention/ Calculation 5 1  Recall 3 3  Language- name 2 objects 2 2  Language- repeat 1 1  Language- follow 3 step command 3 3  Language- read & follow direction 1 1  Write a sentence 1 1  Copy design 1 1  Total score 29 26        06/22/2021    3:15 PM 05/30/2019   12:49 PM  6CIT Screen  What Year? 0  points 0 points  What month? 0 points 0 points  What time? 0 points 0 points  Count back from 20 0 points 0 points  Months in reverse 0 points 0 points  Repeat phrase 0 points 0 points  Total Score 0 points 0 points    Immunizations Immunization History  Administered Date(s) Administered   Fluad Quad(high Dose 65+) 02/15/2022   Influenza Split 01/20/2012, 01/07/2013   Influenza Whole 02/09/2006, 01/11/2008   Influenza, High Dose Seasonal PF 12/30/2016, 12/27/2017, 01/08/2019   Influenza,inj,quad, With Preservative 12/29/2018   Influenza-Unspecified 01/17/2014, 01/14/2016, 12/30/2016   PFIZER Comirnaty(Gray Top)Covid-19 Tri-Sucrose Vaccine 05/28/2019, 03/04/2020, 09/23/2020   Pfizer Covid-19 Vaccine Bivalent Booster 68yr & up 02/25/2022   Pneumococcal Conjugate-13 09/23/2015   Pneumococcal Polysaccharide-23 03/13/2008   Td 12/20/2007   Tdap 07/06/2021   Unspecified SARS-COV-2 Vaccination 04/30/2019  Zoster Recombinat (Shingrix) 03/11/2020, 06/20/2020    TDAP status: Up to date  Flu Vaccine status: Up to date  Pneumococcal vaccine status: Up to date  Covid-19 vaccine status: Information provided on how to obtain vaccines.   Qualifies for Shingles Vaccine? Yes   Zostavax completed Yes   Shingrix Completed?: Yes  Screening Tests Health Maintenance  Topic Date Due   COVID-19 Vaccine (6 - 2023-24 season) 04/22/2022   HEMOGLOBIN A1C  04/15/2022   OPHTHALMOLOGY EXAM  10/24/2023 (Originally 10/24/2018)   FOOT EXAM  03/09/2023   Medicare Annual Wellness (AWV)  05/05/2023   DTaP/Tdap/Td (3 - Td or Tdap) 07/07/2031   Pneumonia Vaccine 38+ Years old  Completed   INFLUENZA VACCINE  Completed   Zoster Vaccines- Shingrix  Completed   HPV VACCINES  Aged Out    Health Maintenance  Health Maintenance Due  Topic Date Due   COVID-19 Vaccine (6 - 2023-24 season) 04/22/2022   HEMOGLOBIN A1C  04/15/2022    Colorectal cancer screening: No longer required.   Lung Cancer  Screening: (Low Dose CT Chest recommended if Age 78-80 years, 30 pack-year currently smoking OR have quit w/in 15years.) does not qualify.     Additional Screening:  Hepatitis C Screening: does not qualify;   Vision Screening: Recommended annual ophthalmology exams for early detection of glaucoma and other disorders of the eye. Is the patient up to date with their annual eye exam?  No  Who is the provider or what is the name of the office in which the patient attends annual eye exams? HPOA will proved If pt is not established with a provider, would they like to be referred to a provider to establish care? No .   Dental Screening: Recommended annual dental exams for proper oral hygiene  Community Resource Referral / Chronic Care Management: CRR required this visit?  No   CCM required this visit?  No      Plan:   Due eye exam if HPOA desires  Ordered Hgb A1c.   I have personally reviewed and noted the following in the patient's chart:   Medical and social history Use of alcohol, tobacco or illicit drugs  Current medications and supplements including opioid prescriptions. Patient is currently taking opioid prescriptions. Information provided to patient regarding non-opioid alternatives. Patient advised to discuss non-opioid treatment plan with their provider. Functional ability and status Nutritional status Physical activity Advanced directives List of other physicians Hospitalizations, surgeries, and ER visits in previous 12 months Vitals Screenings to include cognitive, depression, and falls Referrals and appointments  In addition, I have reviewed and discussed with patient certain preventive protocols, quality metrics, and best practice recommendations. A written personalized care plan for preventive services as well as general preventive health recommendations were provided to patient.     Manpreet Strey X Namish Krise, NP   05/06/2022

## 2022-05-07 DIAGNOSIS — R29898 Other symptoms and signs involving the musculoskeletal system: Secondary | ICD-10-CM | POA: Diagnosis not present

## 2022-05-07 DIAGNOSIS — M6281 Muscle weakness (generalized): Secondary | ICD-10-CM | POA: Diagnosis not present

## 2022-05-07 DIAGNOSIS — R2681 Unsteadiness on feet: Secondary | ICD-10-CM | POA: Diagnosis not present

## 2022-05-07 DIAGNOSIS — R1312 Dysphagia, oropharyngeal phase: Secondary | ICD-10-CM | POA: Diagnosis not present

## 2022-05-07 DIAGNOSIS — M25561 Pain in right knee: Secondary | ICD-10-CM | POA: Diagnosis not present

## 2022-05-10 DIAGNOSIS — M6281 Muscle weakness (generalized): Secondary | ICD-10-CM | POA: Diagnosis not present

## 2022-05-10 DIAGNOSIS — R1312 Dysphagia, oropharyngeal phase: Secondary | ICD-10-CM | POA: Diagnosis not present

## 2022-05-10 DIAGNOSIS — R2681 Unsteadiness on feet: Secondary | ICD-10-CM | POA: Diagnosis not present

## 2022-05-10 DIAGNOSIS — M25561 Pain in right knee: Secondary | ICD-10-CM | POA: Diagnosis not present

## 2022-05-10 DIAGNOSIS — R29898 Other symptoms and signs involving the musculoskeletal system: Secondary | ICD-10-CM | POA: Diagnosis not present

## 2022-05-11 DIAGNOSIS — Z131 Encounter for screening for diabetes mellitus: Secondary | ICD-10-CM | POA: Diagnosis not present

## 2022-05-11 DIAGNOSIS — R1312 Dysphagia, oropharyngeal phase: Secondary | ICD-10-CM | POA: Diagnosis not present

## 2022-05-11 DIAGNOSIS — M25561 Pain in right knee: Secondary | ICD-10-CM | POA: Diagnosis not present

## 2022-05-11 DIAGNOSIS — Z1329 Encounter for screening for other suspected endocrine disorder: Secondary | ICD-10-CM | POA: Diagnosis not present

## 2022-05-11 DIAGNOSIS — M6281 Muscle weakness (generalized): Secondary | ICD-10-CM | POA: Diagnosis not present

## 2022-05-11 DIAGNOSIS — I10 Essential (primary) hypertension: Secondary | ICD-10-CM | POA: Diagnosis not present

## 2022-05-11 DIAGNOSIS — R29898 Other symptoms and signs involving the musculoskeletal system: Secondary | ICD-10-CM | POA: Diagnosis not present

## 2022-05-11 DIAGNOSIS — M25521 Pain in right elbow: Secondary | ICD-10-CM | POA: Diagnosis not present

## 2022-05-11 DIAGNOSIS — R7309 Other abnormal glucose: Secondary | ICD-10-CM | POA: Diagnosis not present

## 2022-05-11 DIAGNOSIS — R2681 Unsteadiness on feet: Secondary | ICD-10-CM | POA: Diagnosis not present

## 2022-05-11 LAB — COMPREHENSIVE METABOLIC PANEL
Calcium: 8.8 (ref 8.7–10.7)
eGFR: 90

## 2022-05-11 LAB — BASIC METABOLIC PANEL
BUN: 10 (ref 4–21)
CO2: 27 — AB (ref 13–22)
Chloride: 100 (ref 99–108)
Creatinine: 0.6 (ref 0.6–1.3)
Glucose: 80
Potassium: 4 mEq/L (ref 3.5–5.1)
Sodium: 134 — AB (ref 137–147)

## 2022-05-12 ENCOUNTER — Ambulatory Visit (HOSPITAL_COMMUNITY)
Admission: RE | Admit: 2022-05-12 | Discharge: 2022-05-12 | Disposition: A | Discharge status: Home / Self Care | Payer: Medicare Other | Source: Ambulatory Visit | Attending: Cardiovascular Disease | Admitting: Cardiovascular Disease

## 2022-05-12 DIAGNOSIS — R2681 Unsteadiness on feet: Secondary | ICD-10-CM | POA: Diagnosis not present

## 2022-05-12 DIAGNOSIS — R29898 Other symptoms and signs involving the musculoskeletal system: Secondary | ICD-10-CM | POA: Diagnosis not present

## 2022-05-12 DIAGNOSIS — R1312 Dysphagia, oropharyngeal phase: Secondary | ICD-10-CM | POA: Diagnosis not present

## 2022-05-12 DIAGNOSIS — M25561 Pain in right knee: Secondary | ICD-10-CM | POA: Diagnosis not present

## 2022-05-12 DIAGNOSIS — I2699 Other pulmonary embolism without acute cor pulmonale: Secondary | ICD-10-CM | POA: Diagnosis not present

## 2022-05-12 DIAGNOSIS — M6281 Muscle weakness (generalized): Secondary | ICD-10-CM | POA: Diagnosis not present

## 2022-05-12 LAB — HEMOGLOBIN A1C: Hemoglobin A1C: 5.3

## 2022-05-12 MED ORDER — DIPHENHYDRAMINE HCL 25 MG PO CAPS: 50.0000 mg | ORAL_CAPSULE | Freq: Once | ORAL | Status: DC

## 2022-05-12 MED ORDER — IOHEXOL 350 MG/ML SOLN
75.0000 mL | Freq: Once | INTRAVENOUS | Status: AC | PRN
  Administered 2022-05-12: 75 mL via INTRAVENOUS

## 2022-05-12 MED ORDER — PREDNISONE 50 MG PO TABS: 50.0000 mg | ORAL_TABLET | Freq: Four times a day (QID) | ORAL | Status: DC

## 2022-05-12 MED ORDER — DIPHENHYDRAMINE HCL 50 MG/ML IJ SOLN: 50.0000 mg | Freq: Once | INTRAMUSCULAR | Status: DC

## 2022-05-13 DIAGNOSIS — R29898 Other symptoms and signs involving the musculoskeletal system: Secondary | ICD-10-CM | POA: Diagnosis not present

## 2022-05-13 DIAGNOSIS — M25561 Pain in right knee: Secondary | ICD-10-CM | POA: Diagnosis not present

## 2022-05-13 DIAGNOSIS — R1312 Dysphagia, oropharyngeal phase: Secondary | ICD-10-CM | POA: Diagnosis not present

## 2022-05-13 DIAGNOSIS — M6281 Muscle weakness (generalized): Secondary | ICD-10-CM | POA: Diagnosis not present

## 2022-05-13 DIAGNOSIS — R2681 Unsteadiness on feet: Secondary | ICD-10-CM | POA: Diagnosis not present

## 2022-05-14 DIAGNOSIS — M25561 Pain in right knee: Secondary | ICD-10-CM | POA: Diagnosis not present

## 2022-05-14 DIAGNOSIS — R2681 Unsteadiness on feet: Secondary | ICD-10-CM | POA: Diagnosis not present

## 2022-05-14 DIAGNOSIS — M6281 Muscle weakness (generalized): Secondary | ICD-10-CM | POA: Diagnosis not present

## 2022-05-14 DIAGNOSIS — R1312 Dysphagia, oropharyngeal phase: Secondary | ICD-10-CM | POA: Diagnosis not present

## 2022-05-14 DIAGNOSIS — R29898 Other symptoms and signs involving the musculoskeletal system: Secondary | ICD-10-CM | POA: Diagnosis not present

## 2022-05-15 DIAGNOSIS — R29898 Other symptoms and signs involving the musculoskeletal system: Secondary | ICD-10-CM | POA: Diagnosis not present

## 2022-05-15 DIAGNOSIS — M25561 Pain in right knee: Secondary | ICD-10-CM | POA: Diagnosis not present

## 2022-05-15 DIAGNOSIS — M6281 Muscle weakness (generalized): Secondary | ICD-10-CM | POA: Diagnosis not present

## 2022-05-15 DIAGNOSIS — R2681 Unsteadiness on feet: Secondary | ICD-10-CM | POA: Diagnosis not present

## 2022-05-15 DIAGNOSIS — R1312 Dysphagia, oropharyngeal phase: Secondary | ICD-10-CM | POA: Diagnosis not present

## 2022-05-17 DIAGNOSIS — R29898 Other symptoms and signs involving the musculoskeletal system: Secondary | ICD-10-CM | POA: Diagnosis not present

## 2022-05-17 DIAGNOSIS — R2681 Unsteadiness on feet: Secondary | ICD-10-CM | POA: Diagnosis not present

## 2022-05-17 DIAGNOSIS — R1312 Dysphagia, oropharyngeal phase: Secondary | ICD-10-CM | POA: Diagnosis not present

## 2022-05-17 DIAGNOSIS — M25561 Pain in right knee: Secondary | ICD-10-CM | POA: Diagnosis not present

## 2022-05-17 DIAGNOSIS — M6281 Muscle weakness (generalized): Secondary | ICD-10-CM | POA: Diagnosis not present

## 2022-05-18 ENCOUNTER — Ambulatory Visit (INDEPENDENT_AMBULATORY_CARE_PROVIDER_SITE_OTHER): Payer: Medicare Other | Admitting: Physician Assistant

## 2022-05-18 ENCOUNTER — Encounter: Payer: Self-pay | Admitting: Physician Assistant

## 2022-05-18 VITALS — BP 138/74 | HR 73 | Ht 67.0 in

## 2022-05-18 DIAGNOSIS — Z9861 Coronary angioplasty status: Secondary | ICD-10-CM | POA: Diagnosis not present

## 2022-05-18 DIAGNOSIS — M6281 Muscle weakness (generalized): Secondary | ICD-10-CM | POA: Diagnosis not present

## 2022-05-18 DIAGNOSIS — R2681 Unsteadiness on feet: Secondary | ICD-10-CM | POA: Diagnosis not present

## 2022-05-18 DIAGNOSIS — R1312 Dysphagia, oropharyngeal phase: Secondary | ICD-10-CM | POA: Diagnosis not present

## 2022-05-18 DIAGNOSIS — R1310 Dysphagia, unspecified: Secondary | ICD-10-CM | POA: Diagnosis not present

## 2022-05-18 DIAGNOSIS — R29898 Other symptoms and signs involving the musculoskeletal system: Secondary | ICD-10-CM | POA: Diagnosis not present

## 2022-05-18 DIAGNOSIS — B351 Tinea unguium: Secondary | ICD-10-CM | POA: Diagnosis not present

## 2022-05-18 DIAGNOSIS — I2510 Atherosclerotic heart disease of native coronary artery without angina pectoris: Secondary | ICD-10-CM | POA: Diagnosis not present

## 2022-05-18 DIAGNOSIS — M2041 Other hammer toe(s) (acquired), right foot: Secondary | ICD-10-CM | POA: Diagnosis not present

## 2022-05-18 DIAGNOSIS — M2042 Other hammer toe(s) (acquired), left foot: Secondary | ICD-10-CM | POA: Diagnosis not present

## 2022-05-18 DIAGNOSIS — K219 Gastro-esophageal reflux disease without esophagitis: Secondary | ICD-10-CM | POA: Diagnosis not present

## 2022-05-18 DIAGNOSIS — Z8719 Personal history of other diseases of the digestive system: Secondary | ICD-10-CM | POA: Diagnosis not present

## 2022-05-18 DIAGNOSIS — M25561 Pain in right knee: Secondary | ICD-10-CM | POA: Diagnosis not present

## 2022-05-18 NOTE — Progress Notes (Signed)
Chief Complaint: Dysphagia  HPI:    Gabriel [name] is a 89 year old Caucasian male with a past medical history as listed below including GERD and stricture, MI on Plavix and Eliquis, known to Dr. [name], who was referred to me by Laurey Morale, MD for a complaint of dysphagia.      09/02/2020 patient seen in clinic by me and described dysphagia.  At that time continue Pantoprazole 40 mg daily and patient was scheduled for an EGD with dilation.    09/05/2020 EGD with benign-appearing esophageal stenosis dilated to 15.5 mm, medium sized hiatal hernia.    06/06/2021 cardiac cath for MI with severe single-vessel CAD treated with a stent.    11/03/2021 echo with LVEF 65-70%.    05/12/2022 CT angio for possible PE with no evidence of PE, large hiatal hernia, dependent atelectasis, cholelithiasis, diverticulosis at the splenic flexure and scattered atherosclerotic calcifications including coronary arteries and aortic atherosclerosis.    Today, the patient presents to clinic accompanied by his daughter from the nursing facility.  He explains that after the last dilation 2 years ago he seemed to do well until about a month or 2 ago after he had a fall and since then he has been only able to eat pured foods.  When describing his meals it sounds like he actually enjoys most of the food but his daughter tells me that it gets boring for him as they are "foodies", apparently had a conversation with his daughter prior to coming to this visit that he did not care about the risk for an EGD and that he wanted another one.  Does tell me though that he fell and hurt his shoulder and they did not want to put him to sleep to fix that either.  He has had an MRI since being seen by [place] last and is on Plavix and Eliquis.  He is not losing weight and has no abdominal pain.    Denies fever, chills, nausea or vomiting.  Past Medical History:  Diagnosis Date   Arthritis    Diverticulosis of colon    External thrombosed hemorrhoids  06/11/2009   Qualifier: Diagnosis of  By: Sarajane Jews MD, Ishmael Holter    GERD (gastroesophageal reflux disease)    Hypertension     Past Surgical History:  Procedure Laterality Date   cataract extaction, right  6/09   per Dr. [name]   CATARACT EXTRACTION Left    COLONOSCOPY  09/23/04   per Dr. [name], clear, no repeats needed    CORONARY STENT INTERVENTION N/A 06/06/2021   Procedure: CORONARY STENT INTERVENTION;  Surgeon: [name], MD;  Location: [facility];  Service: Cardiovascular;  Laterality: N/A;   CORONARY/GRAFT ACUTE MI REVASCULARIZATION N/A 06/06/2021   Procedure: Coronary/Graft Acute MI Revascularization;  Surgeon: [name], MD;  Location: [facility];  Service: Cardiovascular;  Laterality: N/A;   EGD and esophageal diatation,  05-10-11   per Dr. [name]     HERNIA REPAIR     LEFT HEART CATH AND CORONARY ANGIOGRAPHY N/A 06/06/2021   Procedure: LEFT HEART CATH AND CORONARY ANGIOGRAPHY;  Surgeon: [name], MD;  Location: [facility];  Service: Cardiovascular;  Laterality: N/A;   rt inguinal hernia     x2    Current Outpatient Medications  Medication Sig Dispense Refill   acetaminophen (TYLENOL) 325 MG tablet Take 650 mg by mouth 2 (two) times daily.     apixaban (ELIQUIS) 5 MG TABS tablet Take 1  tablet (5 mg total) by mouth 2 (two) times daily. 60 tablet 0   atorvastatin (LIPITOR) 40 MG tablet Take 1 tablet (40 mg total) by mouth daily. 90 tablet 3   clopidogrel (PLAVIX) 75 MG tablet Take 1 tablet (75 mg total) by mouth daily. Take 2 tablets (150 mg) on the first day (12/24/21) 90 tablet 3   diphenhydrAMINE (BENADRYL) 50 MG tablet Take 1 tablet (50 mg total) by mouth at bedtime as needed for itching. 30 tablet 0   diphenhydrAMINE (BENADRYL) 50 MG tablet Take 1 tablet (50 mg total) by mouth once for 1 dose. Take 1 hour prior to test 1 tablet 0   docusate sodium (COLACE) 100 MG capsule Take 100 mg by mouth 2 (two) times daily.     gabapentin  (NEURONTIN) 300 MG capsule TAKE 1 CAPSULE BY MOUTH THREE TIMES A DAY 270 capsule 1   metoprolol tartrate (LOPRESSOR) 25 MG tablet Take 1 tablet (25 mg) by mouth in the AM, then take 0.5 tablet (12.5 mg) by mouth in the PM. 180 tablet 3   nitroGLYCERIN (NITROSTAT) 0.4 MG SL tablet Place 1 tablet (0.4 mg total) under the tongue every 5 (five) minutes as needed for chest pain. 25 tablet 3   pantoprazole (PROTONIX) 40 MG tablet Take 1 tablet (40 mg total) by mouth daily. 30 tablet 0   potassium chloride (KLOR-CON 10) 10 MEQ tablet Take 1 tablet (10 mEq total) by mouth daily. 30 tablet 0   predniSONE (DELTASONE) 50 MG tablet Prednisone 50 mg - take 13 hours prior to test; Take another Prednisone 50 mg 7 hours prior to test; Take another Prednisone 50 mg 1 hour prior to test Take Benadryl 50 mg 1 hour prior to test.  Must complete all four doses of above prophylactic medications. 3 tablet 0   traMADol (ULTRAM) 50 MG tablet Take 1 tablet (50 mg total) by mouth daily. 30 tablet 0   traMADol (ULTRAM) 50 MG tablet Take 1 tablet (50 mg total) by mouth 2 (two) times daily as needed. 30 tablet 0   vitamin C (ASCORBIC ACID) 500 MG tablet Take 500 mg by mouth daily. Reported on 10/27/2015     No current facility-administered medications for this visit.    Allergies as of 05/18/2022 - Review Complete 05/06/2022  Allergen Reaction Noted   Contrast media [iodinated contrast media] Rash 09/12/2013    Family History  Problem Relation Age of Onset   Coronary artery disease Father    Colon cancer Neg Hx    Stomach cancer Neg Hx    Esophageal cancer Neg Hx     Social History   Socioeconomic History   Marital status: Widowed    Spouse name: Not on file   Number of children: 3   Years of education: Not on file   Highest education level: Not on file  Occupational History   Occupation: retired  Tobacco Use   Smoking status: Former    Packs/day: 15.00    Years: 0.50    Total pack years: 7.50    Types:  Cigarettes   Smokeless tobacco: Never   Tobacco comments:    smoked 18 and stopped and states he stopped 30's   Vaping Use   Vaping Use: Never used  Substance and Sexual Activity   Alcohol use: No    Alcohol/week: 0.0 standard drinks of alcohol   Drug use: No   Sexual activity: Never    Birth control/protection: Abstinence  Other Topics Concern  Not on file  Social History Narrative   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    3 daughters.    Social Determinants of Health   Financial Resource Strain: Low Risk  (06/22/2021)   Overall Financial Resource Strain (CARDIA)    Difficulty of Paying Living Expenses: Not hard at all  Food Insecurity: No Food Insecurity (06/22/2021)   Hunger Vital Sign    Worried About Running Out of Food in the Last Year: Never true    Ran Out of Food in the Last Year: Never true  Transportation Needs: No Transportation Needs (06/22/2021)   PRAPARE - Hydrologist (Medical): No    Lack of Transportation (Non-Medical): No  Physical Activity: Insufficiently Active (06/22/2021)   Exercise Vital Sign    Days of Exercise per Week: 3 days    Minutes of Exercise per Session: 30 min  Stress: No Stress Concern Present (06/22/2021)   [place]    Feeling of Stress : Only a little  Social Connections: Moderately Integrated (06/22/2021)   Social Connection and Isolation Panel [NHANES]    Frequency of Communication with Friends and Family: More than three times a week    Frequency of Social Gatherings with Friends and Family: More than three times a week    Attends Religious Services: 1 to 4 times per year    Active Member of [employer] or Organizations: No    Attends Archivist Meetings: 1 to 4 times per year    Marital Status: Widowed  Intimate Partner Violence: Not At Risk (06/22/2021)   Humiliation, Afraid, Rape, and Kick  questionnaire    Fear of Current or Ex-Partner: No    Emotionally Abused: No    Physically Abused: No    Sexually Abused: No    Review of Systems:    Constitutional: No fever or chills Cardiovascular: No chest pain  Respiratory: No SOB  Gastrointestinal: See HPI and otherwise negative   Physical Exam:  Vital signs: BP 138/74   Pulse 73   Ht '5\' 7"'$  (1.702 m)   SpO2 98%   BMI 24.28 kg/m    Constitutional:   Pleasant pale, frail appearing, elderly Caucasian male appears to be in NAD, Well developed, thin appearing, alert and cooperative Respiratory: Respirations even and unlabored. Lungs clear to auscultation bilaterally.   No wheezes, crackles, or rhonchi.  Cardiovascular: Normal S1, S2. No MRG. Regular rate and rhythm. No peripheral edema, cyanosis or pallor.  Gastrointestinal:  Soft, nondistended, nontender. No rebound or guarding. Normal bowel sounds. No appreciable masses or hepatomegaly. Rectal:  Not performed.  Msk:  Symmetrical without gross deformities. Without edema, no deformity or joint abnormality. In a wheelchair, cast on left arm Psychiatric: Demonstrates good judgement and reason without abnormal affect or behaviors.  RELEVANT LABS AND IMAGING: CBC    Component Value Date/Time   WBC 7.2 03/30/2022 0000   WBC 5.0 03/23/2022 2255   RBC 4.06 03/30/2022 0000   HGB 12.7 (A) 03/30/2022 0000   HCT 37 (A) 03/30/2022 0000   PLT 356 03/30/2022 0000   MCV 94.1 03/23/2022 2255   MCH 31.3 03/23/2022 2255   MCHC 33.2 03/23/2022 2255   RDW 13.2 03/23/2022 2255   LYMPHSABS 0.9 03/23/2022 2255   MONOABS 0.6 03/23/2022 2255   EOSABS 0.1 03/23/2022 2255   BASOSABS 0.1 03/23/2022 2255    CMP  Component Value Date/Time   NA 131 (A) 03/30/2022 0000   K 4.4 03/30/2022 0000   CL 98 (A) 03/30/2022 0000   CO2 26 (A) 03/30/2022 0000   GLUCOSE 148 (H) 03/23/2022 2255   BUN 13 03/30/2022 0000   CREATININE 0.5 (A) 03/30/2022 0000   CREATININE 0.77 03/23/2022 2255    CREATININE 0.66 (L) 02/28/2020 1026   CALCIUM 8.5 (A) 03/30/2022 0000   PROT 5.2 (L) 11/04/2021 0548   ALBUMIN 2.7 (L) 11/04/2021 0548   AST 63 (H) 11/04/2021 0548   ALT 37 11/04/2021 0548   ALKPHOS 74 11/04/2021 0548   BILITOT 0.8 11/04/2021 0548   GFRNONAA >60 03/23/2022 2255   GFRNONAA 83 02/28/2020 1026   GFRAA 97 02/28/2020 1026    Assessment: 1.  Dysphagia: Previous esophageal stricture, worsening of dysphagia over the past month or so, now on pured foods; likely stricture 2.  Status post MI and stenting a year ago: On Plavix and Eliquis  Plan: 1.  Discussed that patient will be very high risk for anesthesia and EGD with dilation.  Especially given recent decline in health with an MI and now on blood thinners which would need to be held for this increasing his risk for stroke.  He verbalized understanding and so did his daughter.  Explained to them that at this point given that he is still maintaining his weight and doing okay with pured foods would keep it this way.  If he has any decline in that status or cannot tolerate purees or has other trouble then he should call and let us know and at that point we can rediscuss the risks and benefits of a procedure. 2.  If patient did opt for procedure in the future this would need to take place at the hospital and he would need cardiac clearance to come off of his blood thinners as well as for procedure in general. 3.  For now patient will continue his Pantoprazole 40 mg. 4.  Also discussed this case with Dr. Havery Moros who is in agreement.  Note sent to Dr. Havery Moros in lieu of Dr. [name] absence.  Ellouise Newer, PA-C [facility] 05/18/2022, 3:07 PM  Cc: Laurey Morale, MD

## 2022-05-18 NOTE — Patient Instructions (Signed)
Follow up as needed   If your blood pressure at your visit was 140/90 or greater, please contact your primary care physician to follow up on this.  _______________________________________________________  If you are age 87 or older, your body mass index should be between 23-30. Your Body mass index is 24.28 kg/m. If this is out of the aforementioned range listed, please consider follow up with your Primary Care Provider.  If you are age 20 or younger, your body mass index should be between 19-25. Your Body mass index is 24.28 kg/m. If this is out of the aformentioned range listed, please consider follow up with your Primary Care Provider.   ________________________________________________________  The [facility] providers would like to encourage you to use [facility] to communicate with providers for non-urgent requests or questions.  Due to long hold times on the telephone, sending your provider a message by [facility] may be a faster and more efficient way to get a response.  Please allow 48 business hours for a response.  Please remember that this is for non-urgent requests.   Thank you for entrusting me with your care and choosing [facility].  Ellouise Newer PA-C

## 2022-05-19 DIAGNOSIS — R29898 Other symptoms and signs involving the musculoskeletal system: Secondary | ICD-10-CM | POA: Diagnosis not present

## 2022-05-19 DIAGNOSIS — M6281 Muscle weakness (generalized): Secondary | ICD-10-CM | POA: Diagnosis not present

## 2022-05-19 DIAGNOSIS — R2681 Unsteadiness on feet: Secondary | ICD-10-CM | POA: Diagnosis not present

## 2022-05-19 DIAGNOSIS — M25561 Pain in right knee: Secondary | ICD-10-CM | POA: Diagnosis not present

## 2022-05-19 DIAGNOSIS — R1312 Dysphagia, oropharyngeal phase: Secondary | ICD-10-CM | POA: Diagnosis not present

## 2022-05-19 NOTE — Progress Notes (Signed)
Agree with assessment plan as outlined.  He had a benign stricture in the past dilated with Dr. [name] for his dysphagia.  Likely recurrent stricturing there.  Given his age and comorbidities with antiplatelet and anticoagulant on board with recent MI, high risk for anesthesia.  If he / family really want to pursue an EGD with dilation he would have to have cardiac clearance and case be done at the hospital with holding of his antiplatelets / anticoagulants.  If he is tolerating pured foods and is otherwise okay, not losing weight, etc, reasonable to hold off on that for now.  He can contact us if symptoms worsen.

## 2022-05-20 DIAGNOSIS — R1312 Dysphagia, oropharyngeal phase: Secondary | ICD-10-CM | POA: Diagnosis not present

## 2022-05-20 DIAGNOSIS — R2681 Unsteadiness on feet: Secondary | ICD-10-CM | POA: Diagnosis not present

## 2022-05-20 DIAGNOSIS — R29898 Other symptoms and signs involving the musculoskeletal system: Secondary | ICD-10-CM | POA: Diagnosis not present

## 2022-05-20 DIAGNOSIS — M25561 Pain in right knee: Secondary | ICD-10-CM | POA: Diagnosis not present

## 2022-05-20 DIAGNOSIS — M6281 Muscle weakness (generalized): Secondary | ICD-10-CM | POA: Diagnosis not present

## 2022-05-21 DIAGNOSIS — M25561 Pain in right knee: Secondary | ICD-10-CM | POA: Diagnosis not present

## 2022-05-21 DIAGNOSIS — R29898 Other symptoms and signs involving the musculoskeletal system: Secondary | ICD-10-CM | POA: Diagnosis not present

## 2022-05-21 DIAGNOSIS — M6281 Muscle weakness (generalized): Secondary | ICD-10-CM | POA: Diagnosis not present

## 2022-05-21 DIAGNOSIS — R2681 Unsteadiness on feet: Secondary | ICD-10-CM | POA: Diagnosis not present

## 2022-05-21 DIAGNOSIS — R1312 Dysphagia, oropharyngeal phase: Secondary | ICD-10-CM | POA: Diagnosis not present

## 2022-05-24 DIAGNOSIS — R29898 Other symptoms and signs involving the musculoskeletal system: Secondary | ICD-10-CM | POA: Diagnosis not present

## 2022-05-24 DIAGNOSIS — M25521 Pain in right elbow: Secondary | ICD-10-CM | POA: Diagnosis not present

## 2022-05-24 DIAGNOSIS — R2681 Unsteadiness on feet: Secondary | ICD-10-CM | POA: Diagnosis not present

## 2022-05-24 DIAGNOSIS — M6281 Muscle weakness (generalized): Secondary | ICD-10-CM | POA: Diagnosis not present

## 2022-05-24 DIAGNOSIS — R1312 Dysphagia, oropharyngeal phase: Secondary | ICD-10-CM | POA: Diagnosis not present

## 2022-05-24 DIAGNOSIS — M25561 Pain in right knee: Secondary | ICD-10-CM | POA: Diagnosis not present

## 2022-05-25 DIAGNOSIS — R29898 Other symptoms and signs involving the musculoskeletal system: Secondary | ICD-10-CM | POA: Diagnosis not present

## 2022-05-25 DIAGNOSIS — R1312 Dysphagia, oropharyngeal phase: Secondary | ICD-10-CM | POA: Diagnosis not present

## 2022-05-25 DIAGNOSIS — M6281 Muscle weakness (generalized): Secondary | ICD-10-CM | POA: Diagnosis not present

## 2022-05-25 DIAGNOSIS — M25561 Pain in right knee: Secondary | ICD-10-CM | POA: Diagnosis not present

## 2022-05-25 DIAGNOSIS — R2681 Unsteadiness on feet: Secondary | ICD-10-CM | POA: Diagnosis not present

## 2022-05-26 DIAGNOSIS — M25561 Pain in right knee: Secondary | ICD-10-CM | POA: Diagnosis not present

## 2022-05-26 DIAGNOSIS — R1312 Dysphagia, oropharyngeal phase: Secondary | ICD-10-CM | POA: Diagnosis not present

## 2022-05-26 DIAGNOSIS — R29898 Other symptoms and signs involving the musculoskeletal system: Secondary | ICD-10-CM | POA: Diagnosis not present

## 2022-05-26 DIAGNOSIS — R2681 Unsteadiness on feet: Secondary | ICD-10-CM | POA: Diagnosis not present

## 2022-05-26 DIAGNOSIS — M6281 Muscle weakness (generalized): Secondary | ICD-10-CM | POA: Diagnosis not present

## 2022-05-27 DIAGNOSIS — R29898 Other symptoms and signs involving the musculoskeletal system: Secondary | ICD-10-CM | POA: Diagnosis not present

## 2022-05-27 DIAGNOSIS — M6281 Muscle weakness (generalized): Secondary | ICD-10-CM | POA: Diagnosis not present

## 2022-05-27 DIAGNOSIS — R2681 Unsteadiness on feet: Secondary | ICD-10-CM | POA: Diagnosis not present

## 2022-05-27 DIAGNOSIS — M25561 Pain in right knee: Secondary | ICD-10-CM | POA: Diagnosis not present

## 2022-05-27 DIAGNOSIS — R1312 Dysphagia, oropharyngeal phase: Secondary | ICD-10-CM | POA: Diagnosis not present

## 2022-05-28 DIAGNOSIS — R1312 Dysphagia, oropharyngeal phase: Secondary | ICD-10-CM | POA: Diagnosis not present

## 2022-05-28 DIAGNOSIS — M25561 Pain in right knee: Secondary | ICD-10-CM | POA: Diagnosis not present

## 2022-05-28 DIAGNOSIS — R2681 Unsteadiness on feet: Secondary | ICD-10-CM | POA: Diagnosis not present

## 2022-05-28 DIAGNOSIS — M6281 Muscle weakness (generalized): Secondary | ICD-10-CM | POA: Diagnosis not present

## 2022-05-28 DIAGNOSIS — R29898 Other symptoms and signs involving the musculoskeletal system: Secondary | ICD-10-CM | POA: Diagnosis not present

## 2022-05-31 DIAGNOSIS — R29898 Other symptoms and signs involving the musculoskeletal system: Secondary | ICD-10-CM | POA: Diagnosis not present

## 2022-05-31 DIAGNOSIS — M6281 Muscle weakness (generalized): Secondary | ICD-10-CM | POA: Diagnosis not present

## 2022-05-31 DIAGNOSIS — R2681 Unsteadiness on feet: Secondary | ICD-10-CM | POA: Diagnosis not present

## 2022-05-31 DIAGNOSIS — M25561 Pain in right knee: Secondary | ICD-10-CM | POA: Diagnosis not present

## 2022-05-31 DIAGNOSIS — R1312 Dysphagia, oropharyngeal phase: Secondary | ICD-10-CM | POA: Diagnosis not present

## 2022-06-01 ENCOUNTER — Encounter: Payer: Self-pay | Admitting: Nurse Practitioner

## 2022-06-01 ENCOUNTER — Non-Acute Institutional Stay (SKILLED_NURSING_FACILITY): Payer: Medicare Other | Admitting: Nurse Practitioner

## 2022-06-01 DIAGNOSIS — M25561 Pain in right knee: Secondary | ICD-10-CM | POA: Diagnosis not present

## 2022-06-01 DIAGNOSIS — Z86711 Personal history of pulmonary embolism: Secondary | ICD-10-CM

## 2022-06-01 DIAGNOSIS — K219 Gastro-esophageal reflux disease without esophagitis: Secondary | ICD-10-CM | POA: Diagnosis not present

## 2022-06-01 DIAGNOSIS — I480 Paroxysmal atrial fibrillation: Secondary | ICD-10-CM

## 2022-06-01 DIAGNOSIS — R1312 Dysphagia, oropharyngeal phase: Secondary | ICD-10-CM | POA: Diagnosis not present

## 2022-06-01 DIAGNOSIS — E871 Hypo-osmolality and hyponatremia: Secondary | ICD-10-CM | POA: Diagnosis not present

## 2022-06-01 DIAGNOSIS — Z9861 Coronary angioplasty status: Secondary | ICD-10-CM | POA: Diagnosis not present

## 2022-06-01 DIAGNOSIS — E876 Hypokalemia: Secondary | ICD-10-CM

## 2022-06-01 DIAGNOSIS — E785 Hyperlipidemia, unspecified: Secondary | ICD-10-CM | POA: Diagnosis not present

## 2022-06-01 DIAGNOSIS — I2510 Atherosclerotic heart disease of native coronary artery without angina pectoris: Secondary | ICD-10-CM

## 2022-06-01 DIAGNOSIS — R29898 Other symptoms and signs involving the musculoskeletal system: Secondary | ICD-10-CM | POA: Diagnosis not present

## 2022-06-01 DIAGNOSIS — G609 Hereditary and idiopathic neuropathy, unspecified: Secondary | ICD-10-CM | POA: Diagnosis not present

## 2022-06-01 DIAGNOSIS — I10 Essential (primary) hypertension: Secondary | ICD-10-CM | POA: Diagnosis not present

## 2022-06-01 DIAGNOSIS — M159 Polyosteoarthritis, unspecified: Secondary | ICD-10-CM | POA: Diagnosis not present

## 2022-06-01 DIAGNOSIS — M6281 Muscle weakness (generalized): Secondary | ICD-10-CM | POA: Diagnosis not present

## 2022-06-01 DIAGNOSIS — R2681 Unsteadiness on feet: Secondary | ICD-10-CM | POA: Diagnosis not present

## 2022-06-01 NOTE — Assessment & Plan Note (Signed)
Blood pressure is controlled, on Metoprolol

## 2022-06-01 NOTE — Assessment & Plan Note (Signed)
Na 134 05/11/22 

## 2022-06-01 NOTE — Assessment & Plan Note (Signed)
Heart rate is in control, on Eliquis, Metoprolol 

## 2022-06-01 NOTE — Assessment & Plan Note (Signed)
ST elevation MI LAD 05/2021, underwent Cath, stenting, s/p percutaneous coronary angioplasty, started DAPT, Echo EF 40-45%,  65-70% 11/02/21, started Plavix 12/24/21 per [facility] 

## 2022-06-01 NOTE — Progress Notes (Signed)
Location:  [facility] Room Number: 72 Place of Service:  SNF (31) Provider: Neal Trulson X, NP  Patient Care Team: Laurey Morale, MD as PCP - General [facility]ib[facility] Billings Joyice Faster, MD as PCP - Cardiology (Cardiology) Clent Jacks, MD as Consulting Physician (Ophthalmology) Zadie Rhine Clent Demark, MD as Consulting Physician (Ophthalmology)  Extended Emergency Contact Information Primary Emergency Contact: Derenda Fennel [place] Phone: [phone] Mobile Phone: [phone] Relation: Daughter Secondary Emergency Contact: Horatio Pel [place] Phone: [phone] Relation: Daughter  Code Status:  Full Code  Goals of care: Advanced Directive information    06/01/2022   11:06 AM  Advanced Directives  Does Patient Have a Medical Advance Directive? Yes  Type of Paramedic of [place];Living will  Does patient want to make changes to medical advance directive? No - Patient declined  Copy of [place] in Chart? Yes - validated most recent copy scanned in chart (See row information)     Chief Complaint  Patient presents with   Medical Management of Chronic Issues    Routine Visit   Quality Metric Gaps    Needs to discuss Covid vaccine    HPI:  Pt is a 89 y.o. male seen today for medical management of chronic diseases.      03/24/23 right elbow fx(distal end of right humerus), in cast, mild swelling fingers, pain is well controlled, in hinged elbow ROM brace             CAD ST elevation MI LAD 05/2021, underwent Cath, stenting, s/p percutaneous coronary angioplasty, started DAPT, Echo EF 40-45%,  65-70% 11/02/21, started Plavix 12/24/21 per [facility]             PE 11/02/21, started Eliquis, CTA 05/12/22             Grade 2 diastolic dysfunction, compensated             Afib, on Eliquis, Metoprolol             GERD, thing liquid, hx of dilatation,  takes Pantoprazole, Hgb 12.7 03/30/22              Hypokalemia, on Kcl K 4.0 05/11/22             Hyponatremia, Na 134 05/11/22             HLD on Atorvastatin             HTN, on Metoprolol             Peripheral neuropathy, on Gabapentin  Past Medical History:  Diagnosis Date   Arthritis    Diverticulosis of colon    External thrombosed hemorrhoids 06/11/2009   Qualifier: Diagnosis of  By: Sarajane Jews MD, Ishmael Holter    GERD (gastroesophageal reflux disease)    Hypertension    Past Surgical History:  Procedure Laterality Date   cataract extaction, right  6/09   per Dr. [name]   CATARACT EXTRACTION Left    COLONOSCOPY  09/23/04   per Dr. [name], clear, no repeats needed    CORONARY STENT INTERVENTION N/A 06/06/2021   Procedure: CORONARY STENT INTERVENTION;  Surgeon: [name], MD;  Location: [facility];  Service: Cardiovascular;  Laterality: N/A;   CORONARY/GRAFT ACUTE MI REVASCULARIZATION N/A 06/06/2021   Procedure: Coronary/Graft Acute MI Revascularization;  Surgeon: [name], MD;  Location: [facility];  Service: Cardiovascular;  Laterality: N/A;   EGD  and esophageal diatation,  05-10-11   per Dr. [name]     HERNIA REPAIR     LEFT HEART CATH AND CORONARY ANGIOGRAPHY N/A 06/06/2021   Procedure: LEFT HEART CATH AND CORONARY ANGIOGRAPHY;  Surgeon: [name], MD;  Location: [facility];  Service: Cardiovascular;  Laterality: N/A;   rt inguinal hernia     x2    Allergies  Allergen Reactions   Contrast Media [Iodinated Contrast Media] Rash    Outpatient Encounter Medications as of 06/01/2022  Medication Sig   acetaminophen (TYLENOL) 325 MG tablet Take 650 mg by mouth 2 (two) times daily.   apixaban (ELIQUIS) 5 MG TABS tablet Take 1 tablet (5 mg total) by mouth 2 (two) times daily.   atorvastatin (LIPITOR) 40 MG tablet Take 1 tablet (40 mg total) by mouth daily.   clopidogrel (PLAVIX) 75 MG tablet Take 1 tablet (75 mg total) by mouth daily. Take 2 tablets (150 mg) on the first day (12/24/21)    docusate sodium (COLACE) 100 MG capsule Take 100 mg by mouth 2 (two) times daily.   gabapentin (NEURONTIN) 300 MG capsule TAKE 1 CAPSULE BY MOUTH THREE TIMES A DAY   guaiFENesin (MUCINEX) 600 MG 12 hr tablet Take 1,200 mg by mouth 2 (two) times daily as needed.   guaifenesin (ROBITUSSIN) 100 MG/5ML syrup Take 5 mLs by mouth every 6 (six) hours as needed for cough.   metoprolol tartrate (LOPRESSOR) 25 MG tablet Take 1 tablet (25 mg) by mouth in the AM, then take 0.5 tablet (12.5 mg) by mouth in the PM.   nitroGLYCERIN (NITROSTAT) 0.4 MG SL tablet Place 1 tablet (0.4 mg total) under the tongue every 5 (five) minutes as needed for chest pain.   pantoprazole (PROTONIX) 40 MG tablet Take 1 tablet (40 mg total) by mouth daily.   potassium chloride (KLOR-CON 10) 10 MEQ tablet Take 1 tablet (10 mEq total) by mouth daily.   traMADol (ULTRAM) 50 MG tablet Take 50 mg by mouth every 12 (twelve) hours as needed for severe pain.   vitamin C (ASCORBIC ACID) 500 MG tablet Take 500 mg by mouth daily. Reported on 10/27/2015   [DISCONTINUED] traMADol (ULTRAM) 50 MG tablet Take 1 tablet (50 mg total) by mouth daily.   [DISCONTINUED] traMADol (ULTRAM) 50 MG tablet Take 1 tablet (50 mg total) by mouth 2 (two) times daily as needed.   No facility-administered encounter medications on file as of 06/01/2022.    Review of Systems  Constitutional:  Negative for appetite change, fatigue and fever.  HENT:  Negative for congestion and trouble swallowing.   Eyes:  Negative for visual disturbance.  Respiratory:  Negative for cough, chest tightness, shortness of breath, wheezing and stridor.   Cardiovascular:  Negative for chest pain, palpitations and leg swelling.  Gastrointestinal:  Negative for abdominal pain and constipation.  Genitourinary:  Negative for difficulty urinating, frequency and urgency.       Incontinent of urine  Musculoskeletal:  Positive for arthralgias, gait problem and joint swelling. Negative for neck  pain.  Skin:  Negative for color change.  Neurological:  Negative for speech difficulty, weakness and headaches.  Hematological:  Bruises/bleeds easily.  Psychiatric/Behavioral:  Negative for confusion and sleep disturbance. The patient is not nervous/anxious.     Immunization History  Administered Date(s) Administered   Fluad Quad(high Dose 65+) 02/15/2022   Influenza Split 01/20/2012, 01/07/2013   Influenza Whole 02/09/2006, 01/11/2008   Influenza, High Dose Seasonal PF 12/30/2016, 12/27/2017, 01/08/2019  Influenza,inj,quad, With Preservative 12/29/2018   Influenza-Unspecified 01/17/2014, 01/14/2016, 12/30/2016   PFIZER Comirnaty(Gray Top)Covid-19 Tri-Sucrose Vaccine 05/28/2019, 03/04/2020, 09/23/2020   Pfizer Covid-19 Vaccine Bivalent Booster 38yr & up 02/25/2022   Pneumococcal Conjugate-13 09/23/2015   Pneumococcal Polysaccharide-23 03/13/2008   Td 12/20/2007   Tdap 07/06/2021   Unspecified SARS-COV-2 Vaccination 04/30/2019   Zoster Recombinat (Shingrix) 03/11/2020, 06/20/2020   Pertinent  Health Maintenance Due  Topic Date Due   HEMOGLOBIN A1C  11/10/2022   FOOT EXAM  03/09/2023   INFLUENZA VACCINE  Completed   OPHTHALMOLOGY EXAM  Discontinued      03/25/2022    4:16 PM 03/31/2022    2:32 PM 05/03/2022   12:35 PM 05/04/2022    2:44 PM 06/01/2022   11:04 AM  Fall Risk  Falls in the past year? '1 1 1 1 1  '$ Was there an injury with Fall? '1 1 1 1 1  '$ Fall Risk Category Calculator '3 3 3 3 3  '$ Fall Risk Category (Retired) [employer]  (RETIRED) Patient Fall Risk Level High fall risk High fall risk High fall risk High fall risk   Patient at Risk for Falls Due to History of fall(s);Impaired balance/gait;Impaired vision History of fall(s);Impaired balance/gait;Impaired vision History of fall(s);Impaired balance/gait;Impaired mobility History of fall(s);Impaired balance/gait;Impaired mobility;Impaired vision History of fall(s);Impaired balance/gait;Impaired mobility;Impaired  vision  Fall risk Follow up Falls evaluation completed Falls evaluation completed Falls evaluation completed Falls evaluation completed Falls evaluation completed   Functional Status Survey:    Vitals:   06/01/22 1047  BP: (!) 147/71  Pulse: 67  Resp: 18  Temp: (!) 97.3 F (36.3 C)  SpO2: 97%  Weight: 150 lb 7 oz (68.2 kg)  Height: '5\' 7"'$  (1.702 m)   Body mass index is 23.56 kg/m. Physical Exam Vitals and nursing note reviewed.  Constitutional:      Appearance: Normal appearance.  HENT:     Head: Normocephalic.     Comments: Right orbital contusion, sutures are intact, no active bleeding or s/s of infection.     Nose: Nose normal.     Mouth/Throat:     Mouth: Mucous membranes are moist.  Eyes:     Extraocular Movements: Extraocular movements intact.     Conjunctiva/sclera: Conjunctivae normal.     Pupils: Pupils are equal, round, and reactive to light.  Cardiovascular:     Rate and Rhythm: Normal rate and regular rhythm.     Heart sounds: No murmur heard. Pulmonary:     Effort: Pulmonary effort is normal.     Breath sounds: Normal breath sounds. No rales.  Abdominal:     General: Bowel sounds are normal.     Palpations: Abdomen is soft.     Tenderness: There is no abdominal tenderness.  Musculoskeletal:        General: Swelling present.     Cervical back: Normal range of motion and neck supple.     Comments: Right elbow in a hinged ROM brace,  right fingers mild swelling, able to make a fist, denied pain or numbness.   Skin:    General: Skin is warm and dry.  Neurological:     General: No focal deficit present.     Mental Status: He is alert and oriented to person, place, and time. Mental status is at baseline.     Gait: Gait abnormal.  Psychiatric:        Mood and Affect: Mood normal.        Behavior: Behavior normal.  Thought Content: Thought content normal.     Labs reviewed: Recent Labs    11/02/21 1640 11/02/21 1648 11/02/21 2130  11/03/21 0537 11/04/21 0548 03/23/22 2255 03/30/22 0000 05/11/22 0800  NA 132*  --  139 133*  132* 134* 132* 131* 134*  K 3.7  --  2.4* 4.1  4.0 3.8 4.1 4.4 4.0  CL 105  --  120* 104  104 108 101 98* 100  CO2 18*  --  12* 21*  21* 19* 23 26* 27*  GLUCOSE 116*  --  97 155*  162* 154* 148*  --   --   BUN 18  --  '11 16  16 16 16 13 10  '$ CREATININE 0.72  --  0.36* 0.51*  0.52* 0.51* 0.77 0.5* 0.6  CALCIUM 8.5*  --  5.6* 8.3*  8.3* 8.0* 8.5* 8.5* 8.8  MG 1.9  --  1.3* 1.9  --   --   --   --   PHOS  --  3.3  --  4.4  --   --   --   --    Recent Labs    11/02/21 2130 11/03/21 0537 11/04/21 0548  AST 45* 61* 63*  ALT 24 39 37  ALKPHOS 52 83 74  BILITOT 1.3* 1.5* 0.8  PROT 3.7* 5.9* 5.2*  ALBUMIN 1.8* 3.1* 2.7*   Recent Labs    11/02/21 1640 11/03/21 0537 11/03/21 1737 11/04/21 0548 11/05/21 0450 03/23/22 2255 03/30/22 0000  WBC 7.0 3.8*   < > 9.7 9.0 5.0 7.2  NEUTROABS 4.7 2.9  --   --   --  3.3  --   HGB 12.3* 12.7*   < > 11.4* 11.1* 12.8* 12.7*  HCT 35.9* 37.0*   < > 33.3* 32.7* 38.5* 37*  MCV 93.5 92.7   < > 93.0 94.0 94.1  --   PLT 177 163   < > 157 162 216 356   < > = values in this interval not displayed.   Lab Results  Component Value Date   TSH 2.039 11/02/2021   Lab Results  Component Value Date   HGBA1C 5.3 05/12/2022   Lab Results  Component Value Date   CHOL 93 10/14/2021   HDL 45.60 10/14/2021   LDLCALC 33 10/14/2021   LDLDIRECT 72.0 09/27/2017   TRIG 71.0 10/14/2021   CHOLHDL 2 10/14/2021    Significant Diagnostic Results in last 30 days:  CT Angio Chest Pulmonary Embolism (PE) W or WO Contrast  Result Date: 05/12/2022 CLINICAL DATA:  Pleural effusion, history of blood clotting disorder, question pulmonary embolism EXAM: CT ANGIOGRAPHY CHEST WITH CONTRAST TECHNIQUE: Multidetector CT imaging of the chest was performed using the standard protocol during bolus administration of intravenous contrast. Multiplanar CT image reconstructions and  MIPs were obtained to evaluate the vascular anatomy. Due to history of contrast allergy patient was premedicated for contrast administration. Patient suffered no reaction to IV contrast. RADIATION DOSE REDUCTION: This exam was performed according to the departmental dose-optimization program which includes automated exposure control, adjustment of the mA and/or kV according to patient size and/or use of iterative reconstruction technique. CONTRAST:  72m OMNIPAQUE IOHEXOL 350 MG/ML SOLN IV COMPARISON:  11/03/2021 FINDINGS: Cardiovascular: Atherosclerotic calcifications aorta, proximal great vessels, and coronary arteries. Aorta normal caliber without aneurysm or dissection. No pericardial effusion. Minimal scattered pericardial calcification noted. Pulmonary arteries adequately opacified and patent. No evidence of pulmonary embolism. Mediastinum/Nodes: Large hiatal hernia. Esophagus unremarkable. Base of cervical region normal appearance.  No thoracic adenopathy. Lungs/Pleura: Dependent atelectasis in the posterior lungs bilaterally. Lungs otherwise clear. No pulmonary infiltrate, pleural effusion, or pneumothorax. Upper Abdomen: Cholelithiasis. Simple appearing cyst medial upper RIGHT kidney 3.0 x 2.5 cm; no follow-up imaging recommended. Diverticulosis at splenic flexure of colon. Musculoskeletal: No acute osseous findings. Review of the MIP images confirms the above findings. IMPRESSION: No evidence of pulmonary embolism. Large hiatal hernia. Dependent atelectasis in the posterior lungs bilaterally. Cholelithiasis. Diverticulosis at splenic flexure of colon. Scattered atherosclerotic calcifications including coronary arteries. Aortic Atherosclerosis (ICD10-I70.0). Electronically Signed   By: Lavonia Dana M.D.   On: 05/12/2022 15:55    Assessment/Plan Essential hypertension Blood pressure is controlled, on Metoprolol  Neuropathy, peripheral Stable,  on Gabapentin  Hyperlipidemia  on  Atorvastatin  Hyponatremia  Na 134 05/11/22  Hypokalemia on Kcl K 4.0 05/11/22  GERD thing liquid, hx of dilatation,  takes Pantoprazole, Hgb 12.7 03/30/22  Paroxysmal atrial fibrillation (HCC) Heart rate is in control, on Eliquis, Metoprolol  History of pulmonary embolism on Eliquis, Metoprolol  CAD S/P percutaneous coronary angioplasty  ST elevation MI LAD 05/2021, underwent Cath, stenting, s/p percutaneous coronary angioplasty, started DAPT, Echo EF 40-45%,  65-70% 11/02/21, started Plavix 12/24/21 per [facility]  Osteoarthritis 03/24/23 right elbow fx(distal end of right humerus), in cast, mild swelling fingers, pain is well controlled, in hinged elbow ROM brace     Family/ staff Communication: plan of care reviewed with the patient and charge nurse.   Labs/tests ordered:  none  Time spend 35 minutes.

## 2022-06-01 NOTE — Assessment & Plan Note (Signed)
thing liquid, hx of dilatation,  takes Pantoprazole, Hgb 12.7 03/30/22 

## 2022-06-01 NOTE — Assessment & Plan Note (Signed)
03/24/23 right elbow fx(distal end of right humerus), in cast, mild swelling fingers, pain is well controlled, in hinged elbow ROM brace

## 2022-06-01 NOTE — Assessment & Plan Note (Signed)
on Kcl K 4.0 05/11/22 

## 2022-06-01 NOTE — Assessment & Plan Note (Signed)
on Atorvastatin 

## 2022-06-01 NOTE — Assessment & Plan Note (Signed)
Stable,  on Gabapentin

## 2022-06-01 NOTE — Assessment & Plan Note (Signed)
on Eliquis, Metoprolol

## 2022-06-02 DIAGNOSIS — M25561 Pain in right knee: Secondary | ICD-10-CM | POA: Diagnosis not present

## 2022-06-02 DIAGNOSIS — R1312 Dysphagia, oropharyngeal phase: Secondary | ICD-10-CM | POA: Diagnosis not present

## 2022-06-02 DIAGNOSIS — M6281 Muscle weakness (generalized): Secondary | ICD-10-CM | POA: Diagnosis not present

## 2022-06-02 DIAGNOSIS — R29898 Other symptoms and signs involving the musculoskeletal system: Secondary | ICD-10-CM | POA: Diagnosis not present

## 2022-06-02 DIAGNOSIS — R2681 Unsteadiness on feet: Secondary | ICD-10-CM | POA: Diagnosis not present

## 2022-06-03 DIAGNOSIS — R2681 Unsteadiness on feet: Secondary | ICD-10-CM | POA: Diagnosis not present

## 2022-06-03 DIAGNOSIS — R1312 Dysphagia, oropharyngeal phase: Secondary | ICD-10-CM | POA: Diagnosis not present

## 2022-06-03 DIAGNOSIS — R29898 Other symptoms and signs involving the musculoskeletal system: Secondary | ICD-10-CM | POA: Diagnosis not present

## 2022-06-03 DIAGNOSIS — M25561 Pain in right knee: Secondary | ICD-10-CM | POA: Diagnosis not present

## 2022-06-03 DIAGNOSIS — M6281 Muscle weakness (generalized): Secondary | ICD-10-CM | POA: Diagnosis not present

## 2022-06-04 DIAGNOSIS — M25561 Pain in right knee: Secondary | ICD-10-CM | POA: Diagnosis not present

## 2022-06-04 DIAGNOSIS — R2681 Unsteadiness on feet: Secondary | ICD-10-CM | POA: Diagnosis not present

## 2022-06-04 DIAGNOSIS — R1312 Dysphagia, oropharyngeal phase: Secondary | ICD-10-CM | POA: Diagnosis not present

## 2022-06-04 DIAGNOSIS — M6281 Muscle weakness (generalized): Secondary | ICD-10-CM | POA: Diagnosis not present

## 2022-06-04 DIAGNOSIS — R29898 Other symptoms and signs involving the musculoskeletal system: Secondary | ICD-10-CM | POA: Diagnosis not present

## 2022-06-07 DIAGNOSIS — R2681 Unsteadiness on feet: Secondary | ICD-10-CM | POA: Diagnosis not present

## 2022-06-07 DIAGNOSIS — M6281 Muscle weakness (generalized): Secondary | ICD-10-CM | POA: Diagnosis not present

## 2022-06-07 DIAGNOSIS — R29898 Other symptoms and signs involving the musculoskeletal system: Secondary | ICD-10-CM | POA: Diagnosis not present

## 2022-06-07 DIAGNOSIS — R1312 Dysphagia, oropharyngeal phase: Secondary | ICD-10-CM | POA: Diagnosis not present

## 2022-06-07 DIAGNOSIS — M25561 Pain in right knee: Secondary | ICD-10-CM | POA: Diagnosis not present

## 2022-06-08 DIAGNOSIS — H40013 Open angle with borderline findings, low risk, bilateral: Secondary | ICD-10-CM | POA: Diagnosis not present

## 2022-06-09 DIAGNOSIS — R1312 Dysphagia, oropharyngeal phase: Secondary | ICD-10-CM | POA: Diagnosis not present

## 2022-06-09 DIAGNOSIS — M6281 Muscle weakness (generalized): Secondary | ICD-10-CM | POA: Diagnosis not present

## 2022-06-09 DIAGNOSIS — R2681 Unsteadiness on feet: Secondary | ICD-10-CM | POA: Diagnosis not present

## 2022-06-09 DIAGNOSIS — M25561 Pain in right knee: Secondary | ICD-10-CM | POA: Diagnosis not present

## 2022-06-09 DIAGNOSIS — R29898 Other symptoms and signs involving the musculoskeletal system: Secondary | ICD-10-CM | POA: Diagnosis not present

## 2022-06-10 DIAGNOSIS — M6281 Muscle weakness (generalized): Secondary | ICD-10-CM | POA: Diagnosis not present

## 2022-06-10 DIAGNOSIS — R2681 Unsteadiness on feet: Secondary | ICD-10-CM | POA: Diagnosis not present

## 2022-06-10 DIAGNOSIS — R29898 Other symptoms and signs involving the musculoskeletal system: Secondary | ICD-10-CM | POA: Diagnosis not present

## 2022-06-10 DIAGNOSIS — R1312 Dysphagia, oropharyngeal phase: Secondary | ICD-10-CM | POA: Diagnosis not present

## 2022-06-10 DIAGNOSIS — M25561 Pain in right knee: Secondary | ICD-10-CM | POA: Diagnosis not present

## 2022-06-11 DIAGNOSIS — M25561 Pain in right knee: Secondary | ICD-10-CM | POA: Diagnosis not present

## 2022-06-11 DIAGNOSIS — R2681 Unsteadiness on feet: Secondary | ICD-10-CM | POA: Diagnosis not present

## 2022-06-11 DIAGNOSIS — M6281 Muscle weakness (generalized): Secondary | ICD-10-CM | POA: Diagnosis not present

## 2022-06-11 DIAGNOSIS — R29898 Other symptoms and signs involving the musculoskeletal system: Secondary | ICD-10-CM | POA: Diagnosis not present

## 2022-06-11 DIAGNOSIS — R1312 Dysphagia, oropharyngeal phase: Secondary | ICD-10-CM | POA: Diagnosis not present

## 2022-06-12 DIAGNOSIS — M25561 Pain in right knee: Secondary | ICD-10-CM | POA: Diagnosis not present

## 2022-06-12 DIAGNOSIS — M6281 Muscle weakness (generalized): Secondary | ICD-10-CM | POA: Diagnosis not present

## 2022-06-12 DIAGNOSIS — R1312 Dysphagia, oropharyngeal phase: Secondary | ICD-10-CM | POA: Diagnosis not present

## 2022-06-12 DIAGNOSIS — R29898 Other symptoms and signs involving the musculoskeletal system: Secondary | ICD-10-CM | POA: Diagnosis not present

## 2022-06-12 DIAGNOSIS — R2681 Unsteadiness on feet: Secondary | ICD-10-CM | POA: Diagnosis not present

## 2022-06-14 ENCOUNTER — Ambulatory Visit: Payer: Medicare Other | Admitting: Podiatry

## 2022-06-14 DIAGNOSIS — S42411A Displaced simple supracondylar fracture without intercondylar fracture of right humerus, initial encounter for closed fracture: Secondary | ICD-10-CM | POA: Diagnosis not present

## 2022-06-14 DIAGNOSIS — R29898 Other symptoms and signs involving the musculoskeletal system: Secondary | ICD-10-CM | POA: Diagnosis not present

## 2022-06-14 DIAGNOSIS — M25561 Pain in right knee: Secondary | ICD-10-CM | POA: Diagnosis not present

## 2022-06-14 DIAGNOSIS — R2681 Unsteadiness on feet: Secondary | ICD-10-CM | POA: Diagnosis not present

## 2022-06-14 DIAGNOSIS — M25521 Pain in right elbow: Secondary | ICD-10-CM | POA: Diagnosis not present

## 2022-06-14 DIAGNOSIS — M6281 Muscle weakness (generalized): Secondary | ICD-10-CM | POA: Diagnosis not present

## 2022-06-14 DIAGNOSIS — R1312 Dysphagia, oropharyngeal phase: Secondary | ICD-10-CM | POA: Diagnosis not present

## 2022-06-15 ENCOUNTER — Non-Acute Institutional Stay (SKILLED_NURSING_FACILITY): Payer: Medicare Other | Admitting: Nurse Practitioner

## 2022-06-15 ENCOUNTER — Encounter: Payer: Self-pay | Admitting: Nurse Practitioner

## 2022-06-15 DIAGNOSIS — K219 Gastro-esophageal reflux disease without esophagitis: Secondary | ICD-10-CM | POA: Diagnosis not present

## 2022-06-15 DIAGNOSIS — Z86711 Personal history of pulmonary embolism: Secondary | ICD-10-CM | POA: Diagnosis not present

## 2022-06-15 DIAGNOSIS — I2510 Atherosclerotic heart disease of native coronary artery without angina pectoris: Secondary | ICD-10-CM | POA: Diagnosis not present

## 2022-06-15 DIAGNOSIS — E871 Hypo-osmolality and hyponatremia: Secondary | ICD-10-CM | POA: Diagnosis not present

## 2022-06-15 DIAGNOSIS — I5189 Other ill-defined heart diseases: Secondary | ICD-10-CM | POA: Diagnosis not present

## 2022-06-15 DIAGNOSIS — Z9861 Coronary angioplasty status: Secondary | ICD-10-CM

## 2022-06-15 DIAGNOSIS — E876 Hypokalemia: Secondary | ICD-10-CM

## 2022-06-15 DIAGNOSIS — E785 Hyperlipidemia, unspecified: Secondary | ICD-10-CM | POA: Diagnosis not present

## 2022-06-15 DIAGNOSIS — I480 Paroxysmal atrial fibrillation: Secondary | ICD-10-CM

## 2022-06-15 DIAGNOSIS — R1312 Dysphagia, oropharyngeal phase: Secondary | ICD-10-CM | POA: Diagnosis not present

## 2022-06-15 DIAGNOSIS — R2681 Unsteadiness on feet: Secondary | ICD-10-CM | POA: Diagnosis not present

## 2022-06-15 DIAGNOSIS — M6281 Muscle weakness (generalized): Secondary | ICD-10-CM | POA: Diagnosis not present

## 2022-06-15 DIAGNOSIS — G609 Hereditary and idiopathic neuropathy, unspecified: Secondary | ICD-10-CM

## 2022-06-15 DIAGNOSIS — I10 Essential (primary) hypertension: Secondary | ICD-10-CM | POA: Diagnosis not present

## 2022-06-15 DIAGNOSIS — R29898 Other symptoms and signs involving the musculoskeletal system: Secondary | ICD-10-CM | POA: Diagnosis not present

## 2022-06-15 DIAGNOSIS — S42401A Unspecified fracture of lower end of right humerus, initial encounter for closed fracture: Secondary | ICD-10-CM | POA: Diagnosis not present

## 2022-06-15 DIAGNOSIS — M25561 Pain in right knee: Secondary | ICD-10-CM | POA: Diagnosis not present

## 2022-06-15 NOTE — Progress Notes (Signed)
Location:   SNF [place] Room Number: NO/65/A Place of Service:  SNF (31) Provider: [facility] Tyden Kann NP  Laurey Morale, MD  Patient Care Team: Laurey Morale, MD as PCP - General Troy Sine, MD as PCP - Cardiology (Cardiology) Clent Jacks, MD as Consulting Physician (Ophthalmology) Zadie Rhine Clent Demark, MD as Consulting Physician (Ophthalmology)  Extended Emergency Contact Information Primary Emergency Contact: Derenda Fennel [place] Phone: [phone] Mobile Phone: [phone] Relation: Daughter Secondary Emergency Contact: Horatio Pel [place] of [place] Phone: [phone] Relation: Daughter  Code Status:  DNR Goals of care: Advanced Directive information    06/15/2022    9:14 AM  Advanced Directives  Does Patient Have a Medical Advance Directive? Yes  Type of Paramedic of [place];Living will  Does patient want to make changes to medical advance directive? No - Patient declined  Copy of [place] in Chart? Yes - validated most recent copy scanned in chart (See row information)     Chief Complaint  Patient presents with   Medical Management of Chronic Issues    Patient is here for a follow up for chronic conditions     HPI:  Pt is a 89 y.o. male seen today for medical management of chronic diseases.     03/24/23 right elbow fx(distal end of right humerus), mild swelling fingers, pain is well controlled, in hinged elbow ROM brace             CAD ST elevation MI LAD 05/2021, underwent Cath, stenting, s/p percutaneous coronary angioplasty, started DAPT, Echo EF 40-45%,  65-70% 11/02/21, started Plavix 12/24/21 per [facility]             PE 11/02/21, started Eliquis, CTA 05/12/22             Grade 2 diastolic dysfunction, compensated             Afib, on Eliquis, Metoprolol             GERD, thing liquid, hx of dilatation,  takes Pantoprazole, Hgb 12.7 03/30/22             Hypokalemia,  on Kcl K 4.0 05/11/22             Hyponatremia, Na 134 05/11/22             HLD on Atorvastatin             HTN, on Metoprolol             Peripheral neuropathy, on Gabapentin  Past Medical History:  Diagnosis Date   Arthritis    Diverticulosis of colon    External thrombosed hemorrhoids 06/11/2009   Qualifier: Diagnosis of  By: Sarajane Jews MD, Ishmael Holter    GERD (gastroesophageal reflux disease)    Hypertension    Past Surgical History:  Procedure Laterality Date   cataract extaction, right  6/09   per Dr. [name]   CATARACT EXTRACTION Left    COLONOSCOPY  09/23/04   per Dr. [name], clear, no repeats needed    CORONARY STENT INTERVENTION N/A 06/06/2021   Procedure: CORONARY STENT INTERVENTION;  Surgeon: [name], MD;  Location: [facility];  Service: Cardiovascular;  Laterality: N/A;   CORONARY/GRAFT ACUTE MI REVASCULARIZATION N/A 06/06/2021   Procedure: Coronary/Graft Acute MI Revascularization;  Surgeon: [name], MD;  Location: [facility];  Service: Cardiovascular;  Laterality: N/A;   EGD and esophageal diatation,  05-10-11   per Dr. [name]     HERNIA REPAIR     LEFT HEART CATH AND CORONARY ANGIOGRAPHY N/A 06/06/2021   Procedure: LEFT HEART CATH AND CORONARY ANGIOGRAPHY;  Surgeon: [name], MD;  Location: [facility];  Service: Cardiovascular;  Laterality: N/A;   rt inguinal hernia     x2    Allergies  Allergen Reactions   Contrast Media [Iodinated Contrast Media] Rash    Allergies as of 06/15/2022       Reactions   Contrast Media [iodinated Contrast Media] Rash        Medication List        Accurate as of June 15, 2022 11:59 PM. If you have any questions, ask your nurse or doctor.          acetaminophen 325 MG tablet Commonly known as: TYLENOL Take 650 mg by mouth 2 (two) times daily.   ascorbic acid 500 MG tablet Commonly known as: VITAMIN C Take 500 mg by mouth daily. Reported on 10/27/2015   atorvastatin 40 MG  tablet Commonly known as: LIPITOR Take 1 tablet (40 mg total) by mouth daily.   clopidogrel 75 MG tablet Commonly known as: PLAVIX Take 1 tablet (75 mg total) by mouth daily. Take 2 tablets (150 mg) on the first day (12/24/21)   docusate sodium 100 MG capsule Commonly known as: COLACE Take 100 mg by mouth 2 (two) times daily.   Eliquis 5 MG Tabs tablet Generic drug: apixaban Take 1 tablet (5 mg total) by mouth 2 (two) times daily.   gabapentin 300 MG capsule Commonly known as: NEURONTIN TAKE 1 CAPSULE BY MOUTH THREE TIMES A DAY   guaiFENesin 600 MG 12 hr tablet Commonly known as: MUCINEX Take 1,200 mg by mouth 2 (two) times daily as needed.   guaifenesin 100 MG/5ML syrup Commonly known as: ROBITUSSIN Take 5 mLs by mouth every 6 (six) hours as needed for cough.   metoprolol tartrate 25 MG tablet Commonly known as: LOPRESSOR Take 1 tablet (25 mg) by mouth in the AM, then take 0.5 tablet (12.5 mg) by mouth in the PM.   nitroGLYCERIN 0.4 MG SL tablet Commonly known as: Nitrostat Place 1 tablet (0.4 mg total) under the tongue every 5 (five) minutes as needed for chest pain.   pantoprazole 40 MG tablet Commonly known as: PROTONIX Take 1 tablet (40 mg total) by mouth daily.   potassium chloride 10 MEQ tablet Commonly known as: Klor-Con 10 Take 1 tablet (10 mEq total) by mouth daily.   traMADol 50 MG tablet Commonly known as: ULTRAM Take 50 mg by mouth every 12 (twelve) hours as needed for severe pain.        Review of Systems  Constitutional:  Negative for appetite change, fatigue and fever.  HENT:  Negative for congestion and trouble swallowing.   Eyes:  Negative for visual disturbance.  Respiratory:  Negative for cough, chest tightness, shortness of breath, wheezing and stridor.   Cardiovascular:  Negative for chest pain, palpitations and leg swelling.  Gastrointestinal:  Negative for abdominal pain and constipation.  Genitourinary:  Negative for difficulty  urinating, frequency and urgency.       Incontinent of urine  Musculoskeletal:  Positive for arthralgias, gait problem and joint swelling. Negative for neck pain.  Skin:  Negative for color change.  Neurological:  Negative for speech difficulty, weakness and headaches.  Hematological:  Bruises/bleeds easily.  Psychiatric/Behavioral:  Negative for confusion and sleep disturbance. The patient is  not nervous/anxious.     Immunization History  Administered Date(s) Administered   Fluad Quad(high Dose 65+) 02/15/2022   Influenza Split 01/20/2012, 01/07/2013   Influenza Whole 02/09/2006, 01/11/2008   Influenza, High Dose Seasonal PF 12/30/2016, 12/27/2017, 01/08/2019   Influenza,inj,quad, With Preservative 12/29/2018   Influenza-Unspecified 01/17/2014, 01/14/2016, 12/30/2016   PFIZER Comirnaty(Gray Top)Covid-19 Tri-Sucrose Vaccine 05/28/2019, 03/04/2020, 09/23/2020   Pfizer Covid-19 Vaccine Bivalent Booster 62yr & up 02/25/2022   Pneumococcal Conjugate-13 09/23/2015   Pneumococcal Polysaccharide-23 03/13/2008   Td 12/20/2007   Tdap 07/06/2021   Unspecified SARS-COV-2 Vaccination 04/30/2019   Zoster Recombinat (Shingrix) 03/11/2020, 06/20/2020   Pertinent  Health Maintenance Due  Topic Date Due   HEMOGLOBIN A1C  11/10/2022   FOOT EXAM  03/09/2023   INFLUENZA VACCINE  Completed   OPHTHALMOLOGY EXAM  Discontinued      03/31/2022    2:32 PM 05/03/2022   12:35 PM 05/04/2022    2:44 PM 06/01/2022   11:04 AM 06/15/2022    9:14 AM  Fall Risk  Falls in the past year? '1 1 1 1 1  '$ Was there an injury with Fall? '1 1 1 1 1  '$ Fall Risk Category Calculator '3 3 3 3 3  '$ Fall Risk Category (Retired) [employer]   (RETIRED) Patient Fall Risk Level High fall risk High fall risk High fall risk    Patient at Risk for Falls Due to History of fall(s);Impaired balance/gait;Impaired vision History of fall(s);Impaired balance/gait;Impaired mobility History of fall(s);Impaired balance/gait;Impaired  mobility;Impaired vision History of fall(s);Impaired balance/gait;Impaired mobility;Impaired vision History of fall(s);Impaired balance/gait;Impaired mobility  Fall risk Follow up Falls evaluation completed Falls evaluation completed Falls evaluation completed Falls evaluation completed Falls evaluation completed   Functional Status Survey:    Vitals:   06/15/22 0915  BP: (!) 147/78  Pulse: 66  Resp: 18  Temp: (!) 97.3 F (36.3 C)  SpO2: 97%  Weight: 150 lb 11.2 oz (68.4 kg)  Height: '5\' 7"'$  (1.702 m)   Body mass index is 23.6 kg/m. Physical Exam Vitals and nursing note reviewed.  Constitutional:      Appearance: Normal appearance.  HENT:     Head: Normocephalic.     Comments: Right orbital contusion, sutures are intact, no active bleeding or s/s of infection.     Nose: Nose normal.     Mouth/Throat:     Mouth: Mucous membranes are moist.  Eyes:     Extraocular Movements: Extraocular movements intact.     Conjunctiva/sclera: Conjunctivae normal.     Pupils: Pupils are equal, round, and reactive to light.  Cardiovascular:     Rate and Rhythm: Normal rate and regular rhythm.     Heart sounds: No murmur heard. Pulmonary:     Effort: Pulmonary effort is normal.     Breath sounds: Normal breath sounds. No rales.  Abdominal:     General: Bowel sounds are normal.     Palpations: Abdomen is soft.     Tenderness: There is no abdominal tenderness.  Musculoskeletal:        General: Swelling present.     Cervical back: Normal range of motion and neck supple.     Comments: Right elbow in a hinged ROM brace,  right fingers mild swelling, able to make a fist, denied pain or numbness.   Skin:    General: Skin is warm and dry.  Neurological:     General: No focal deficit present.     Mental Status: He is alert and oriented  to person, place, and time. Mental status is at baseline.     Gait: Gait abnormal.  Psychiatric:        Mood and Affect: Mood normal.        Behavior: Behavior  normal.        Thought Content: Thought content normal.     Labs reviewed: Recent Labs    11/02/21 1640 11/02/21 1648 11/02/21 2130 11/03/21 0537 11/04/21 0548 03/23/22 2255 03/30/22 0000 05/11/22 0800  NA 132*  --  139 133*  132* 134* 132* 131* 134*  K 3.7  --  2.4* 4.1  4.0 3.8 4.1 4.4 4.0  CL 105  --  120* 104  104 108 101 98* 100  CO2 18*  --  12* 21*  21* 19* 23 26* 27*  GLUCOSE 116*  --  97 155*  162* 154* 148*  --   --   BUN 18  --  '11 16  16 16 16 13 10  '$ CREATININE 0.72  --  0.36* 0.51*  0.52* 0.51* 0.77 0.5* 0.6  CALCIUM 8.5*  --  5.6* 8.3*  8.3* 8.0* 8.5* 8.5* 8.8  MG 1.9  --  1.3* 1.9  --   --   --   --   PHOS  --  3.3  --  4.4  --   --   --   --    Recent Labs    11/02/21 2130 11/03/21 0537 11/04/21 0548  AST 45* 61* 63*  ALT 24 39 37  ALKPHOS 52 83 74  BILITOT 1.3* 1.5* 0.8  PROT 3.7* 5.9* 5.2*  ALBUMIN 1.8* 3.1* 2.7*   Recent Labs    11/02/21 1640 11/03/21 0537 11/03/21 1737 11/04/21 0548 11/05/21 0450 03/23/22 2255 03/30/22 0000  WBC 7.0 3.8*   < > 9.7 9.0 5.0 7.2  NEUTROABS 4.7 2.9  --   --   --  3.3  --   HGB 12.3* 12.7*   < > 11.4* 11.1* 12.8* 12.7*  HCT 35.9* 37.0*   < > 33.3* 32.7* 38.5* 37*  MCV 93.5 92.7   < > 93.0 94.0 94.1  --   PLT 177 163   < > 157 162 216 356   < > = values in this interval not displayed.   Lab Results  Component Value Date   TSH 2.039 11/02/2021   Lab Results  Component Value Date   HGBA1C 5.3 05/12/2022   Lab Results  Component Value Date   CHOL 93 10/14/2021   HDL 45.60 10/14/2021   LDLCALC 33 10/14/2021   LDLDIRECT 72.0 09/27/2017   TRIG 71.0 10/14/2021   CHOLHDL 2 10/14/2021    Significant Diagnostic Results in last 30 days:  No results found.  Assessment/Plan  Essential hypertension Blood pressure is controlled, continue Metoprolol.   Neuropathy, peripheral  on Gabapentin   Hyperlipidemia on Atorvastatin  Hyponatremia Na 134 05/11/22  Hypokalemia on Kcl K 4.0  05/11/22  GERD thing liquid, hx of dilatation,  takes Pantoprazole, Hgb 12.7 03/30/22  Paroxysmal atrial fibrillation (HCC) Heart rate is in control, on Eliquis, Metoprolol  Diastolic dysfunction  Grade 2 diastolic dysfunction, compensated  History of pulmonary embolism PE 11/02/21, started Eliquis, CTA 05/12/22  CAD S/P percutaneous coronary angioplasty  ST elevation MI LAD 05/2021, underwent Cath, stenting, s/p percutaneous coronary angioplasty, started DAPT, Echo EF 40-45%,  65-70% 11/02/21, started Plavix 12/24/21 per [facility]  Fracture of distal humerus 03/24/23 right elbow fx(distal end of right humerus), mild swelling  fingers, pain is well controlled, in hinged elbow ROM brace   Family/ staff Communication: plan of care reviewed with the patient and charge nurse.   Labs/tests ordered:  none  Time spend 35 minutes.

## 2022-06-15 NOTE — Assessment & Plan Note (Signed)
03/24/23 right elbow fx(distal end of right humerus), mild swelling fingers, pain is well controlled, in hinged elbow ROM brace

## 2022-06-15 NOTE — Assessment & Plan Note (Signed)
PE 11/02/21, started Eliquis, CTA 05/12/22

## 2022-06-15 NOTE — Assessment & Plan Note (Signed)
on Atorvastatin

## 2022-06-15 NOTE — Assessment & Plan Note (Signed)
on Gabapentin

## 2022-06-15 NOTE — Assessment & Plan Note (Signed)
on Kcl K 4.0 05/11/22

## 2022-06-15 NOTE — Assessment & Plan Note (Signed)
Blood pressure is controlled, continue Metoprolol. 

## 2022-06-15 NOTE — Assessment & Plan Note (Signed)
thing liquid, hx of dilatation,  takes Pantoprazole, Hgb 12.7 03/30/22

## 2022-06-15 NOTE — Assessment & Plan Note (Signed)
Na 134 05/11/22

## 2022-06-15 NOTE — Assessment & Plan Note (Signed)
Grade 2 diastolic dysfunction, compensated

## 2022-06-15 NOTE — Assessment & Plan Note (Signed)
Heart rate is in control, on Eliquis, Metoprolol

## 2022-06-15 NOTE — Assessment & Plan Note (Signed)
ST elevation MI LAD 05/2021, underwent Cath, stenting, s/p percutaneous coronary angioplasty, started DAPT, Echo EF 40-45%,  65-70% 11/02/21, started Plavix 12/24/21 per [facility]

## 2022-06-16 DIAGNOSIS — M6281 Muscle weakness (generalized): Secondary | ICD-10-CM | POA: Diagnosis not present

## 2022-06-16 DIAGNOSIS — M25561 Pain in right knee: Secondary | ICD-10-CM | POA: Diagnosis not present

## 2022-06-16 DIAGNOSIS — R1312 Dysphagia, oropharyngeal phase: Secondary | ICD-10-CM | POA: Diagnosis not present

## 2022-06-16 DIAGNOSIS — R29898 Other symptoms and signs involving the musculoskeletal system: Secondary | ICD-10-CM | POA: Diagnosis not present

## 2022-06-16 DIAGNOSIS — R2681 Unsteadiness on feet: Secondary | ICD-10-CM | POA: Diagnosis not present

## 2022-06-17 DIAGNOSIS — M25561 Pain in right knee: Secondary | ICD-10-CM | POA: Diagnosis not present

## 2022-06-17 DIAGNOSIS — R2681 Unsteadiness on feet: Secondary | ICD-10-CM | POA: Diagnosis not present

## 2022-06-17 DIAGNOSIS — M6281 Muscle weakness (generalized): Secondary | ICD-10-CM | POA: Diagnosis not present

## 2022-06-17 DIAGNOSIS — R29898 Other symptoms and signs involving the musculoskeletal system: Secondary | ICD-10-CM | POA: Diagnosis not present

## 2022-06-17 DIAGNOSIS — R1312 Dysphagia, oropharyngeal phase: Secondary | ICD-10-CM | POA: Diagnosis not present

## 2022-06-18 DIAGNOSIS — R1312 Dysphagia, oropharyngeal phase: Secondary | ICD-10-CM | POA: Diagnosis not present

## 2022-06-18 DIAGNOSIS — M25561 Pain in right knee: Secondary | ICD-10-CM | POA: Diagnosis not present

## 2022-06-18 DIAGNOSIS — R2681 Unsteadiness on feet: Secondary | ICD-10-CM | POA: Diagnosis not present

## 2022-06-18 DIAGNOSIS — R29898 Other symptoms and signs involving the musculoskeletal system: Secondary | ICD-10-CM | POA: Diagnosis not present

## 2022-06-18 DIAGNOSIS — M6281 Muscle weakness (generalized): Secondary | ICD-10-CM | POA: Diagnosis not present

## 2022-06-21 DIAGNOSIS — M6281 Muscle weakness (generalized): Secondary | ICD-10-CM | POA: Diagnosis not present

## 2022-06-21 DIAGNOSIS — R1312 Dysphagia, oropharyngeal phase: Secondary | ICD-10-CM | POA: Diagnosis not present

## 2022-06-21 DIAGNOSIS — M25561 Pain in right knee: Secondary | ICD-10-CM | POA: Diagnosis not present

## 2022-06-21 DIAGNOSIS — R2681 Unsteadiness on feet: Secondary | ICD-10-CM | POA: Diagnosis not present

## 2022-06-21 DIAGNOSIS — R29898 Other symptoms and signs involving the musculoskeletal system: Secondary | ICD-10-CM | POA: Diagnosis not present

## 2022-06-22 DIAGNOSIS — M25561 Pain in right knee: Secondary | ICD-10-CM | POA: Diagnosis not present

## 2022-06-22 DIAGNOSIS — R29898 Other symptoms and signs involving the musculoskeletal system: Secondary | ICD-10-CM | POA: Diagnosis not present

## 2022-06-22 DIAGNOSIS — M6281 Muscle weakness (generalized): Secondary | ICD-10-CM | POA: Diagnosis not present

## 2022-06-22 DIAGNOSIS — R1312 Dysphagia, oropharyngeal phase: Secondary | ICD-10-CM | POA: Diagnosis not present

## 2022-06-22 DIAGNOSIS — R2681 Unsteadiness on feet: Secondary | ICD-10-CM | POA: Diagnosis not present

## 2022-06-23 ENCOUNTER — Ambulatory Visit (INDEPENDENT_AMBULATORY_CARE_PROVIDER_SITE_OTHER): Payer: Medicare Other

## 2022-06-23 VITALS — Ht 67.0 in | Wt 152.0 lb

## 2022-06-23 DIAGNOSIS — Z0000 Encounter for general adult medical examination without abnormal findings: Secondary | ICD-10-CM | POA: Diagnosis not present

## 2022-06-23 DIAGNOSIS — R2681 Unsteadiness on feet: Secondary | ICD-10-CM | POA: Diagnosis not present

## 2022-06-23 DIAGNOSIS — M6281 Muscle weakness (generalized): Secondary | ICD-10-CM | POA: Diagnosis not present

## 2022-06-23 DIAGNOSIS — R29898 Other symptoms and signs involving the musculoskeletal system: Secondary | ICD-10-CM | POA: Diagnosis not present

## 2022-06-23 DIAGNOSIS — M25561 Pain in right knee: Secondary | ICD-10-CM | POA: Diagnosis not present

## 2022-06-23 DIAGNOSIS — R1312 Dysphagia, oropharyngeal phase: Secondary | ICD-10-CM | POA: Diagnosis not present

## 2022-06-23 NOTE — Progress Notes (Signed)
Subjective:   Gabriel [name] is a 89 y.o. male who presents for Medicare Annual/Subsequent preventive examination.  Review of Systems    Virtual Visit via Telephone Note  I connected with  Gabriel [name] on 06/23/22 at  2:45 PM EST by telephone and verified that I am speaking with the correct person using two identifiers.  Location: Patient: Home Provider: Office Persons participating in the virtual visit: patient/Nurse Health Advisor   I discussed the limitations, risks, security and privacy concerns of performing an evaluation and management service by telephone and the availability of in person appointments. The patient expressed understanding and agreed to proceed.  Interactive audio and video telecommunications were attempted between this nurse and patient, however failed, due to patient having technical difficulties OR patient did not have access to video capability.  We continued and completed visit with audio only.  Some vital signs may be absent or patient reported.   [name], LPN  Cardiac Risk Factors include: advanced age (>71mn, >>19women);hypertension;male gender     Objective:    Today's Vitals   06/23/22 1500  Weight: 152 lb (68.9 kg)  Height: '5\' 7"'$  (1.702 m)   Body mass index is 23.81 kg/m.     06/15/2022    9:14 AM 06/01/2022   11:06 AM 05/04/2022    2:44 PM 05/03/2022   12:35 PM 03/31/2022    2:34 PM 11/02/2021   11:00 PM 06/22/2021    3:10 PM  Advanced Directives  Does Patient Have a Medical Advance Directive? Yes Yes Yes Yes Yes Yes No  Type of AParamedicof [place] will [place] will [place] will [place] will [place]Living will Living will   Does patient want to make changes to medical advance directive? No - Patient declined No - Patient declined No - Patient declined No - Patient declined No - Patient  declined No - Patient declined   Copy of [place] Chart? Yes - validated most recent copy scanned in chart (See row information) Yes - validated most recent copy scanned in chart (See row information) Yes - validated most recent copy scanned in chart (See row information) Yes - validated most recent copy scanned in chart (See row information) Yes - validated most recent copy scanned in chart (See row information)    Would patient like information on creating a medical advance directive?       No - Patient declined    Current Medications (verified) Outpatient Encounter Medications as of 06/23/2022  Medication Sig   acetaminophen (TYLENOL) 325 MG tablet Take 650 mg by mouth 2 (two) times daily.   apixaban (ELIQUIS) 5 MG TABS tablet Take 1 tablet (5 mg total) by mouth 2 (two) times daily.   atorvastatin (LIPITOR) 40 MG tablet Take 1 tablet (40 mg total) by mouth daily.   clopidogrel (PLAVIX) 75 MG tablet Take 1 tablet (75 mg total) by mouth daily. Take 2 tablets (150 mg) on the first day (12/24/21)   docusate sodium (COLACE) 100 MG capsule Take 100 mg by mouth 2 (two) times daily.   gabapentin (NEURONTIN) 300 MG capsule TAKE 1 CAPSULE BY MOUTH THREE TIMES A DAY   guaiFENesin (MUCINEX) 600 MG 12 hr tablet Take 1,200 mg by mouth 2 (two) times daily as needed.   guaifenesin (ROBITUSSIN) 100 MG/5ML syrup Take 5 mLs by mouth every 6 (six) hours as needed for cough.  metoprolol tartrate (LOPRESSOR) 25 MG tablet Take 1 tablet (25 mg) by mouth in the AM, then take 0.5 tablet (12.5 mg) by mouth in the PM.   nitroGLYCERIN (NITROSTAT) 0.4 MG SL tablet Place 1 tablet (0.4 mg total) under the tongue every 5 (five) minutes as needed for chest pain.   pantoprazole (PROTONIX) 40 MG tablet Take 1 tablet (40 mg total) by mouth daily.   potassium chloride (KLOR-CON 10) 10 MEQ tablet Take 1 tablet (10 mEq total) by mouth daily.   traMADol (ULTRAM) 50 MG tablet Take 50 mg by mouth every 12  (twelve) hours as needed for severe pain.   vitamin C (ASCORBIC ACID) 500 MG tablet Take 500 mg by mouth daily. Reported on 10/27/2015   No facility-administered encounter medications on file as of 06/23/2022.    Allergies (verified) Contrast media [iodinated contrast media]   History: Past Medical History:  Diagnosis Date   Arthritis    Diverticulosis of colon    External thrombosed hemorrhoids 06/11/2009   Qualifier: Diagnosis of  By: Sarajane Jews MD, Ishmael Holter    GERD (gastroesophageal reflux disease)    Hypertension    Past Surgical History:  Procedure Laterality Date   cataract extaction, right  6/09   per Dr. [name]   CATARACT EXTRACTION Left    COLONOSCOPY  09/23/04   per Dr. [name], clear, no repeats needed    CORONARY STENT INTERVENTION N/A 06/06/2021   Procedure: CORONARY STENT INTERVENTION;  Surgeon: [name], MD;  Location: [facility];  Service: Cardiovascular;  Laterality: N/A;   CORONARY/GRAFT ACUTE MI REVASCULARIZATION N/A 06/06/2021   Procedure: Coronary/Graft Acute MI Revascularization;  Surgeon: [name], MD;  Location: [facility];  Service: Cardiovascular;  Laterality: N/A;   EGD and esophageal diatation,  05-10-11   per Dr. [name]     HERNIA REPAIR     LEFT HEART CATH AND CORONARY ANGIOGRAPHY N/A 06/06/2021   Procedure: LEFT HEART CATH AND CORONARY ANGIOGRAPHY;  Surgeon: [name], MD;  Location: [facility];  Service: Cardiovascular;  Laterality: N/A;   rt inguinal hernia     x2   Family History  Problem Relation Age of Onset   Coronary artery disease Father    Colon cancer Neg Hx    Stomach cancer Neg Hx    Esophageal cancer Neg Hx    Pancreatic cancer Neg Hx    Social History   Socioeconomic History   Marital status: Widowed    Spouse name: Not on file   Number of children: 3   Years of education: Not on file   Highest education level: Not on file  Occupational History   Occupation: retired  Tobacco Use    Smoking status: Former    Packs/day: 15.00    Years: 0.50    Total pack years: 7.50    Types: Cigarettes   Smokeless tobacco: Never   Tobacco comments:    smoked 18 and stopped and states he stopped 30's   Vaping Use   Vaping Use: Never used  Substance and Sexual Activity   Alcohol use: No    Alcohol/week: 0.0 standard drinks of alcohol   Drug use: No   Sexual activity: Never    Birth control/protection: Abstinence  Other Topics Concern   Not on file  Social History Narrative   Lives in studio at [place] since 2009, was married x 57 years.    3 daughters.    Social Determinants  of Health   Financial Resource Strain: Low Risk  (06/23/2022)   Overall Financial Resource Strain (CARDIA)    Difficulty of Paying Living Expenses: Not hard at all  Food Insecurity: No Food Insecurity (06/23/2022)   Hunger Vital Sign    Worried About Running Out of Food in the Last Year: Never true    Ran Out of Food in the Last Year: Never true  Transportation Needs: No Transportation Needs (06/23/2022)   PRAPARE - Hydrologist (Medical): No    Lack of Transportation (Non-Medical): No  Physical Activity: Sufficiently Active (06/23/2022)   Exercise Vital Sign    Days of Exercise per Week: 4 days    Minutes of Exercise per Session: 60 min  Stress: No Stress Concern Present (06/23/2022)   [place]    Feeling of Stress : Not at all  Social Connections: Moderately Integrated (06/23/2022)   Social Connection and Isolation Panel [NHANES]    Frequency of Communication with Friends and Family: More than three times a week    Frequency of Social Gatherings with Friends and Family: More than three times a week    Attends Religious Services: More than 4 times per year    Active Member of [employer] or Organizations: Yes    Attends Archivist Meetings: More than 4 times per year    Marital Status:  Widowed    Tobacco Counseling Counseling given: Not Answered Tobacco comments: smoked 18 and stopped and states he stopped 30's    Clinical Intake:  Pre-visit preparation completed: No  Pain : No/denies pain     BMI - recorded: 23.81 Nutritional Status: BMI of 19-24  Normal Nutritional Risks: None Diabetes: No  How often do you need to have someone help you when you read instructions, pamphlets, or other written materials from your doctor or pharmacy?: 1 - Never  Diabetic?  No  Interpreter Needed?: No  Information entered by :: Rolene Arbour LPN   Activities of Daily Living    06/23/2022    3:07 PM 05/06/2022   10:57 AM  In your present state of health, do you have any difficulty performing the following activities:  Hearing? 1 1  Comment Wears hearing aids hearing aids  Vision? 0 0  Difficulty concentrating or making decisions? 0 0  Walking or climbing stairs? 1 1  Comment Uses electric wheelchair, walker and cane   Dressing or bathing? 0 1  Doing errands, shopping? 0 1  Preparing Food and eating ? N N  Using the Toilet? N Y  In the past six months, have you accidently leaked urine? Y Y  Comment Wearf breifs. Followed by PCP   Do you have problems with loss of bowel control? N N  Managing your Medications? N Y  Managing your Finances? N Y  Housekeeping or managing your Housekeeping? Aggie Moats    Patient Care Team: Laurey Morale, MD as PCP - General Claiborne Billings Joyice Faster, MD as PCP - Cardiology (Cardiology) Clent Jacks, MD as Consulting Physician (Ophthalmology) Zadie Rhine Clent Demark, MD as Consulting Physician (Ophthalmology)  Indicate any recent Medical Services you may have received from other than [facility] providers in the past year (date may be approximate).     Assessment:   This is a routine wellness examination for Oluwaferanmi.  Hearing/Vision screen Hearing Screening - Comments:: Wears hearing aids Vision Screening - Comments:: Wears reading  glasses - up to date  with  routine eye exams with  Dr Katy Fitch  Dietary issues and exercise activities discussed: Current Exercise Habits: Home exercise routine, Type of exercise: Other - see comments (PT), Time (Minutes): 60, Frequency (Times/Week): 4, Weekly Exercise (Minutes/Week): 240, Intensity: Moderate, Exercise limited by: None identified   Goals Addressed               This Visit's Progress     Patient Stated (pt-stated)        Keep exercising; and get by over to assisted living where I was before I fell        Depression Screen    06/23/2022    3:05 PM 06/01/2022   11:05 AM 03/31/2022    2:31 PM 10/14/2021   10:56 AM 06/23/2021    1:56 PM 06/22/2021    3:03 PM 06/19/2020   11:52 AM  PHQ 2/9 Scores  PHQ - 2 Score 0 0 0 0 1 0 0  PHQ- 9 Score    0 1      Fall Risk    06/23/2022    3:12 PM 06/15/2022    9:14 AM 06/01/2022   11:04 AM 05/04/2022    2:44 PM 05/03/2022   12:35 PM  [place] in the past year? '1 1 1 1 1  '$ Number falls in past yr: 0 '1 1 1 1  '$ Injury with Fall? '1 1 1 1 1  '$ Comment Fx Rt [place]. Followed by Orthopedic      Risk for fall due to : Orthopedic patient History of fall(s);Impaired balance/gait;Impaired mobility History of fall(s);Impaired balance/gait;Impaired mobility;Impaired vision History of fall(s);Impaired balance/gait;Impaired mobility;Impaired vision History of fall(s);Impaired balance/gait;Impaired mobility  Follow up Falls prevention discussed Falls evaluation completed Falls evaluation completed Falls evaluation completed Falls evaluation completed    FALL RISK PREVENTION PERTAINING TO THE HOME:  Any stairs in or around the home? Yes  If so, are there any without handrails? No  Home free of loose throw rugs in walkways, pet beds, electrical cords, etc? Yes  Adequate lighting in your home to reduce risk of falls? Yes   ASSISTIVE DEVICES UTILIZED TO PREVENT FALLS:  Life alert? No  Use of a cane, walker or w/c? Yes  Grab bars in the bathroom? Yes  Shower  chair or bench in shower? Yes  Elevated toilet seat or a handicapped toilet? No   TIMED UP AND GO:  Was the test performed? No . Audio Visit   Cognitive Function:    10/20/2016   10:32 AM 10/17/2015   10:58 AM  MMSE - Mini Mental State Exam  Orientation to time 4 5  Orientation to Place 5 5  Registration 3 3  Attention/ Calculation 5 1  Recall 3 3  Language- name 2 objects 2 2  Language- repeat 1 1  Language- follow 3 step command 3 3  Language- read & follow direction 1 1  Write a sentence 1 1  Copy design 1 1  Total score 29 26        06/23/2022    3:14 PM 06/22/2021    3:15 PM 05/30/2019   12:49 PM  6CIT Screen  What Year? 0 points 0 points 0 points  What month? 0 points 0 points 0 points  What time? 0 points 0 points 0 points  Count back from 20 0 points 0 points 0 points  Months in reverse 0 points 0 points 0 points  Repeat phrase 0 points 0 points 0 points  Total  Score 0 points 0 points 0 points    Immunizations Immunization History  Administered Date(s) Administered   Fluad Quad(high Dose 65+) 02/15/2022   Influenza Split 01/20/2012, 01/07/2013   Influenza Whole 02/09/2006, 01/11/2008   Influenza, High Dose Seasonal PF 12/30/2016, 12/27/2017, 01/08/2019   Influenza,inj,quad, With Preservative 12/29/2018   Influenza-Unspecified 01/17/2014, 01/14/2016, 12/30/2016   PFIZER Comirnaty(Gray Top)Covid-19 Tri-Sucrose Vaccine 05/28/2019, 03/04/2020, 09/23/2020   Pfizer Covid-19 Vaccine Bivalent Booster 1yr & up 02/25/2022   Pneumococcal Conjugate-13 09/23/2015   Pneumococcal Polysaccharide-23 03/13/2008   Td 12/20/2007   Tdap 07/06/2021   Unspecified SARS-COV-2 Vaccination 04/30/2019   Zoster Recombinat (Shingrix) 03/11/2020, 06/20/2020    TDAP status: Up to date  Flu Vaccine status: Up to date  Pneumococcal vaccine status: Up to date  Covid-19 vaccine status: Completed vaccines  Qualifies for Shingles Vaccine? Yes   Zostavax completed Yes   Shingrix  Completed?: Yes  Screening Tests Health Maintenance  Topic Date Due   COVID-19 Vaccine (6 - 2023-24 season) 07/26/2022 (Originally 04/22/2022)   HEMOGLOBIN A1C  11/10/2022   FOOT EXAM  03/09/2023   Medicare Annual Wellness (AWV)  06/24/2023   DTaP/Tdap/Td (3 - Td or Tdap) 07/07/2031   Pneumonia Vaccine 89 Years old  Completed   INFLUENZA VACCINE  Completed   Zoster Vaccines- Shingrix  Completed   HPV VACCINES  Aged Out   OPHTHALMOLOGY EXAM  Discontinued    Health Maintenance  There are no preventive care reminders to display for this patient.  Colorectal cancer screening: No longer required.   Lung Cancer Screening: (Low Dose CT Chest recommended if Age 87-80years, 30 pack-year currently smoking OR have quit w/in 15years.) does not qualify.     Additional Screening:  Hepatitis C Screening: does not qualify; Completed   Vision Screening: Recommended annual ophthalmology exams for early detection of glaucoma and other disorders of the eye. Is the patient up to date with their annual eye exam?  Yes  Who is the provider or what is the name of the office in which the patient attends annual eye exams? Dr GKaty FitchIf pt is not established with a provider, would they like to be referred to a provider to establish care? No .   Dental Screening: Recommended annual dental exams for proper oral hygiene  Community Resource Referral / Chronic Care Management:  CRR required this visit?  No   CCM required this visit?  No      Plan:     I have personally reviewed and noted the following in the patient's chart:   Medical and social history Use of alcohol, tobacco or illicit drugs  Current medications and supplements including opioid prescriptions. Patient is currently taking opioid prescriptions. Information provided to patient regarding non-opioid alternatives. Patient advised to discuss non-opioid treatment plan with their provider. Functional ability and status Nutritional  status Physical activity Advanced directives List of other physicians Hospitalizations, surgeries, and ER visits in previous 12 months Vitals Screenings to include cognitive, depression, and falls Referrals and appointments  In addition, I have reviewed and discussed with patient certain preventive protocols, quality metrics, and best practice recommendations. A written personalized care plan for preventive services as well as general preventive health recommendations were provided to patient.     BCriselda Peaches LPN   [id number]  Nurse Notes: None

## 2022-06-23 NOTE — Patient Instructions (Addendum)
Gabriel [name] , Thank you for taking time to come for your Medicare Wellness Visit. I appreciate your ongoing commitment to your health goals. Please review the following plan we discussed and let me know if I can assist you in the future.   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      Prevent Falls (pt-stated)      Continue using walker and doing strength and balance exercises.        This is a list of the screening recommended for you and due dates:  Health Maintenance  Topic Date Due   COVID-19 Vaccine (6 - 2023-24 season) 07/26/2022*   Hemoglobin A1C  11/10/2022   Complete foot exam   03/09/2023   Medicare Annual Wellness Visit  06/24/2023   DTaP/Tdap/Td vaccine (3 - Td or Tdap) 07/07/2031   Pneumonia Vaccine  Completed   Flu Shot  Completed   Zoster (Shingles) Vaccine  Completed   HPV Vaccine  Aged Out   Eye exam for diabetics  Discontinued  *Topic was postponed. The date shown is not the original due date.  Opioid Pain Medicine Management Opioids are powerful medicines that are used to treat moderate to severe pain. When used for short periods of time, they can help you to: Sleep better. Do better in physical or occupational therapy. Feel better in the first few days after an injury. Recover from surgery. Opioids should be taken with the supervision of a trained health care provider. They should be taken for the shortest period of time possible. This is because opioids can be addictive, and the longer you take opioids, the greater your risk of addiction. This addiction can also be called opioid use disorder. What are the risks? Using opioid pain medicines for longer than 3 days increases your risk of side effects. Side  effects include: Constipation. Nausea and vomiting. Breathing difficulties (respiratory depression). Drowsiness. Confusion. Opioid use disorder. Itching. Taking opioid pain medicine for a long period of time can affect your ability to do daily tasks. It also puts you at risk for: Motor vehicle crashes. Depression. Suicide. Heart attack. Overdose, which can be life-threatening. What is a pain treatment plan? A pain treatment plan is an agreement between you and your health care provider. Pain is unique to each person, and treatments vary depending on your condition. To manage your pain, you and your health care provider need to work together. To help you do this: Discuss the goals of your treatment, including how much pain you might expect to have and how you will manage the pain. Review the risks and benefits of taking opioid medicines. Remember that a good treatment plan uses more than one approach and minimizes the chance of side effects. Be honest about the amount of medicines you take and about any drug or alcohol use. Get pain medicine prescriptions from only one health care provider. Pain can be managed with many types of alternative treatments. Ask your health care provider to refer you to one or more specialists who can help you manage pain through: Physical or occupational therapy. Counseling (cognitive behavioral therapy).  Good nutrition. Biofeedback. Massage. Meditation. Non-opioid medicine. Following a gentle exercise program. How to use opioid pain medicine Taking medicine Take your pain medicine exactly as told by your health care provider. Take it only when you need it. If your pain gets less severe, you may take less than your prescribed dose if your health care provider approves. If you are not having pain, do nottake pain medicine unless your health care provider tells you to take it. If your pain is severe, do nottry to treat it yourself by taking more pills than  instructed on your prescription. Contact your health care provider for help. Write down the times when you take your pain medicine. It is easy to become confused while on pain medicine. Writing the time can help you avoid overdose. Take other over-the-counter or prescription medicines only as told by your health care provider. Keeping yourself and others safe  While you are taking opioid pain medicine: Do not drive, use machinery, or power tools. Do not sign legal documents. Do not drink alcohol. Do not take sleeping pills. Do not supervise children by yourself. Do not do activities that require climbing or being in high places. Do not go to a lake, river, ocean, spa, or swimming pool. Do not share your pain medicine with anyone. Keep pain medicine in a locked cabinet or in a secure area where pets and children cannot reach it. Stopping your use of opioids If you have been taking opioid medicine for more than a few weeks, you may need to slowly decrease (taper) how much you take until you stop completely. Tapering your use of opioids can decrease your risk of symptoms of withdrawal, such as: Pain and cramping in the abdomen. Nausea. Sweating. Sleepiness. Restlessness. Uncontrollable shaking (tremors). Cravings for the medicine. Do not attempt to taper your use of opioids on your own. Talk with your health care provider about how to do this. Your health care provider may prescribe a step-down schedule based on how much medicine you are taking and how long you have been taking it. Getting rid of leftover pills Do not save any leftover pills. Get rid of leftover pills safely by: Taking the medicine to a prescription take-back program. This is usually offered by the county or law enforcement. Bringing them to a pharmacy that has a drug disposal container. Flushing them down the toilet. Check the label or package insert of your medicine to see whether this is safe to do. Throwing them out in  the trash. Check the label or package insert of your medicine to see whether this is safe to do. If it is safe to throw it out, remove the medicine from the original container, put it into a sealable bag or container, and mix it with used coffee grounds, food scraps, dirt, or cat litter before putting it in the trash. Follow these instructions at home: Activity Do exercises as told by your health care provider. Avoid activities that make your pain worse. Return to your normal activities as told by your health care provider. Ask your health care provider what activities are safe for you. General instructions You may need to take these actions to prevent or treat constipation: Drink enough fluid to keep your urine pale yellow. Take over-the-counter or prescription medicines. Eat foods that are high in fiber, such as beans, whole grains, and fresh fruits and vegetables. Limit foods that are high in fat and processed sugars, such as fried or sweet foods. Keep all follow-up visits. This  is important. Where to find support If you have been taking opioids for a long time, you may benefit from receiving support for quitting from a local support group or counselor. Ask your health care provider for a referral to these resources in your area. Where to find more information Centers for Disease Control and Prevention (CDC): http://www.wolf.info/ [place] Food and Drug Administration (FDA): [employer] Get help right away if: You may have taken too much of an opioid (overdosed). Common symptoms of an overdose: Your breathing is slower or more shallow than normal. You have a very slow heartbeat (pulse). You have slurred speech. You have nausea and vomiting. Your pupils become very small. You have other potential symptoms: You are very confused. You faint or feel like you will faint. You have cold, clammy skin. You have blue lips or fingernails. You have thoughts of harming yourself or harming others. These  symptoms may represent a serious problem that is an emergency. Do not wait to see if the symptoms will go away. Get medical help right away. Call your local emergency services (911 in the [place] Do not drive yourself to the hospital.  If you ever feel like you may hurt yourself or others, or have thoughts about taking your own life, get help right away. Go to your nearest emergency department or: Call your local emergency services (911 in the [place] Call the [facility] 226-605-4221 in the [place] Call a suicide crisis helpline, such as the [facility] at 817 783 7947 or 988 in the [place]. This is open 24 hours a day in the [place] Text the Crisis Text Line at 7266138376 (in the [place].). Summary Opioid medicines can help you manage moderate to severe pain for a short period of time. A pain treatment plan is an agreement between you and your health care provider. Discuss the goals of your treatment, including how much pain you might expect to have and how you will manage the pain. If you think that you or someone else may have taken too much of an opioid, get medical help right away. This information is not intended to replace advice given to you by your health care provider. Make sure you discuss any questions you have with your health care provider. Document Revised: 11/05/2020 Document Reviewed: 07/23/2020 Elsevier Patient Education  [place] directives: In Chart  Conditions/risks identified: None  Next appointment: Follow up in one year for your annual wellness visit.    Preventive Care 64 Years and Older, Male  Preventive care refers to lifestyle choices and visits with your health care provider that can promote health and wellness. What does preventive care include? A yearly physical exam. This is also called an annual well check. Dental exams once or twice a year. Routine eye exams. Ask your health care provider how often you  should have your eyes checked. Personal lifestyle choices, including: Daily care of your teeth and gums. Regular physical activity. Eating a healthy diet. Avoiding tobacco and drug use. Limiting alcohol use. Practicing safe sex. Taking low doses of aspirin every day. Taking vitamin and mineral supplements as recommended by your health care provider. What happens during an annual well check? The services and screenings done by your health care provider during your annual well check will depend on your age, overall health, lifestyle risk factors, and family history of disease. Counseling  Your health care provider may ask you questions about your: Alcohol use. Tobacco use. Drug use. Emotional well-being.  Home and relationship well-being. Sexual activity. Eating habits. History of falls. Memory and ability to understand (cognition). Work and work Statistician. Screening  You may have the following tests or measurements: Height, weight, and BMI. Blood pressure. Lipid and cholesterol levels. These may be checked every 5 years, or more frequently if you are over 41 years old. Skin check. Lung cancer screening. You may have this screening every year starting at age 35 if you have a 30-pack-year history of smoking and currently smoke or have quit within the past 15 years. Fecal occult blood test (FOBT) of the stool. You may have this test every year starting at age 23. Flexible sigmoidoscopy or colonoscopy. You may have a sigmoidoscopy every 5 years or a colonoscopy every 10 years starting at age 67. Prostate cancer screening. Recommendations will vary depending on your family history and other risks. Hepatitis C blood test. Hepatitis B blood test. Sexually transmitted disease (STD) testing. Diabetes screening. This is done by checking your blood sugar (glucose) after you have not eaten for a while (fasting). You may have this done every 1-3 years. Abdominal aortic aneurysm (AAA)  screening. You may need this if you are a current or former smoker. Osteoporosis. You may be screened starting at age 60 if you are at high risk. Talk with your health care provider about your test results, treatment options, and if necessary, the need for more tests. Vaccines  Your health care provider may recommend certain vaccines, such as: Influenza vaccine. This is recommended every year. Tetanus, diphtheria, and acellular pertussis (Tdap, Td) vaccine. You may need a Td booster every 10 years. Zoster vaccine. You may need this after age 1. Pneumococcal 13-valent conjugate (PCV13) vaccine. One dose is recommended after age 57. Pneumococcal polysaccharide (PPSV23) vaccine. One dose is recommended after age 22. Talk to your health care provider about which screenings and vaccines you need and how often you need them. This information is not intended to replace advice given to you by your health care provider. Make sure you discuss any questions you have with your health care provider. Document Released: 05/09/2015 Document Revised: 12/31/2015 Document Reviewed: 02/11/2015 Elsevier Interactive Patient Education  2017 [place] Prevention in the Home Falls can cause injuries. They can happen to people of all ages. There are many things you can do to make your home safe and to help prevent falls. What can I do on the outside of my home? Regularly fix the edges of walkways and driveways and fix any cracks. Remove anything that might make you trip as you walk through a door, such as a raised step or threshold. Trim any bushes or trees on the path to your home. Use bright outdoor lighting. Clear any walking paths of anything that might make someone trip, such as rocks or tools. Regularly check to see if handrails are loose or broken. Make sure that both sides of any steps have handrails. Any raised decks and porches should have guardrails on the edges. Have any leaves, snow, or ice  cleared regularly. Use sand or salt on walking paths during winter. Clean up any spills in your garage right away. This includes oil or grease spills. What can I do in the bathroom? Use night lights. Install grab bars by the toilet and in the tub and shower. Do not use towel bars as grab bars. Use non-skid mats or decals in the tub or shower. If you need to sit down in the shower, use a plastic, non-slip  stool. Keep the floor dry. Clean up any water that spills on the floor as soon as it happens. Remove soap buildup in the tub or shower regularly. Attach bath mats securely with double-sided non-slip rug tape. Do not have throw rugs and other things on the floor that can make you trip. What can I do in the bedroom? Use night lights. Make sure that you have a light by your bed that is easy to reach. Do not use any sheets or blankets that are too big for your bed. They should not hang down onto the floor. Have a firm chair that has side arms. You can use this for support while you get dressed. Do not have throw rugs and other things on the floor that can make you trip. What can I do in the kitchen? Clean up any spills right away. Avoid walking on wet floors. Keep items that you use a lot in easy-to-reach places. If you need to reach something above you, use a strong step stool that has a grab bar. Keep electrical cords out of the way. Do not use floor polish or wax that makes floors slippery. If you must use wax, use non-skid floor wax. Do not have throw rugs and other things on the floor that can make you trip. What can I do with my stairs? Do not leave any items on the stairs. Make sure that there are handrails on both sides of the stairs and use them. Fix handrails that are broken or loose. Make sure that handrails are as long as the stairways. Check any carpeting to make sure that it is firmly attached to the stairs. Fix any carpet that is loose or worn. Avoid having throw rugs at the  top or bottom of the stairs. If you do have throw rugs, attach them to the floor with carpet tape. Make sure that you have a light switch at the top of the stairs and the bottom of the stairs. If you do not have them, ask someone to add them for you. What else can I do to help prevent falls? Wear shoes that: Do not have high heels. Have rubber bottoms. Are comfortable and fit you well. Are closed at the toe. Do not wear sandals. If you use a stepladder: Make sure that it is fully opened. Do not climb a closed stepladder. Make sure that both sides of the stepladder are locked into place. Ask someone to hold it for you, if possible. Clearly mark and make sure that you can see: Any grab bars or handrails. First and last steps. Where the edge of each step is. Use tools that help you move around (mobility aids) if they are needed. These include: Canes. Walkers. Scooters. Crutches. Turn on the lights when you go into a dark area. Replace any light bulbs as soon as they burn out. Set up your furniture so you have a clear path. Avoid moving your furniture around. If any of your floors are uneven, fix them. If there are any pets around you, be aware of where they are. Review your medicines with your doctor. Some medicines can make you feel dizzy. This can increase your chance of falling. Ask your doctor what other things that you can do to help prevent falls. This information is not intended to replace advice given to you by your health care provider. Make sure you discuss any questions you have with your health care provider. Document Released: 02/06/2009 Document Revised: 09/18/2015 Document Reviewed: 05/17/2014  Chartered certified accountant Patient Education  [employer].

## 2022-06-24 DIAGNOSIS — R1312 Dysphagia, oropharyngeal phase: Secondary | ICD-10-CM | POA: Diagnosis not present

## 2022-06-24 DIAGNOSIS — R29898 Other symptoms and signs involving the musculoskeletal system: Secondary | ICD-10-CM | POA: Diagnosis not present

## 2022-06-24 DIAGNOSIS — M25561 Pain in right knee: Secondary | ICD-10-CM | POA: Diagnosis not present

## 2022-06-24 DIAGNOSIS — M6281 Muscle weakness (generalized): Secondary | ICD-10-CM | POA: Diagnosis not present

## 2022-06-24 DIAGNOSIS — R2681 Unsteadiness on feet: Secondary | ICD-10-CM | POA: Diagnosis not present

## 2022-06-25 ENCOUNTER — Encounter: Payer: Self-pay | Admitting: Nurse Practitioner

## 2022-06-25 DIAGNOSIS — R29898 Other symptoms and signs involving the musculoskeletal system: Secondary | ICD-10-CM | POA: Diagnosis not present

## 2022-06-25 DIAGNOSIS — M25561 Pain in right knee: Secondary | ICD-10-CM | POA: Diagnosis not present

## 2022-06-25 DIAGNOSIS — R2681 Unsteadiness on feet: Secondary | ICD-10-CM | POA: Diagnosis not present

## 2022-06-25 DIAGNOSIS — S079XXD Crushing injury of head, part unspecified, subsequent encounter: Secondary | ICD-10-CM | POA: Diagnosis not present

## 2022-06-25 DIAGNOSIS — R1312 Dysphagia, oropharyngeal phase: Secondary | ICD-10-CM | POA: Diagnosis not present

## 2022-06-25 DIAGNOSIS — R296 Repeated falls: Secondary | ICD-10-CM | POA: Insufficient documentation

## 2022-06-28 DIAGNOSIS — M25561 Pain in right knee: Secondary | ICD-10-CM | POA: Diagnosis not present

## 2022-06-28 DIAGNOSIS — S079XXD Crushing injury of head, part unspecified, subsequent encounter: Secondary | ICD-10-CM | POA: Diagnosis not present

## 2022-06-28 DIAGNOSIS — R2681 Unsteadiness on feet: Secondary | ICD-10-CM | POA: Diagnosis not present

## 2022-06-28 DIAGNOSIS — R29898 Other symptoms and signs involving the musculoskeletal system: Secondary | ICD-10-CM | POA: Diagnosis not present

## 2022-06-28 DIAGNOSIS — R1312 Dysphagia, oropharyngeal phase: Secondary | ICD-10-CM | POA: Diagnosis not present

## 2022-06-29 DIAGNOSIS — S079XXD Crushing injury of head, part unspecified, subsequent encounter: Secondary | ICD-10-CM | POA: Diagnosis not present

## 2022-06-29 DIAGNOSIS — R2681 Unsteadiness on feet: Secondary | ICD-10-CM | POA: Diagnosis not present

## 2022-06-29 DIAGNOSIS — R29898 Other symptoms and signs involving the musculoskeletal system: Secondary | ICD-10-CM | POA: Diagnosis not present

## 2022-06-29 DIAGNOSIS — M25561 Pain in right knee: Secondary | ICD-10-CM | POA: Diagnosis not present

## 2022-06-29 DIAGNOSIS — R1312 Dysphagia, oropharyngeal phase: Secondary | ICD-10-CM | POA: Diagnosis not present

## 2022-06-30 DIAGNOSIS — M25561 Pain in right knee: Secondary | ICD-10-CM | POA: Diagnosis not present

## 2022-06-30 DIAGNOSIS — R29898 Other symptoms and signs involving the musculoskeletal system: Secondary | ICD-10-CM | POA: Diagnosis not present

## 2022-06-30 DIAGNOSIS — R1312 Dysphagia, oropharyngeal phase: Secondary | ICD-10-CM | POA: Diagnosis not present

## 2022-06-30 DIAGNOSIS — S079XXD Crushing injury of head, part unspecified, subsequent encounter: Secondary | ICD-10-CM | POA: Diagnosis not present

## 2022-06-30 DIAGNOSIS — R2681 Unsteadiness on feet: Secondary | ICD-10-CM | POA: Diagnosis not present

## 2022-07-01 DIAGNOSIS — S079XXD Crushing injury of head, part unspecified, subsequent encounter: Secondary | ICD-10-CM | POA: Diagnosis not present

## 2022-07-01 DIAGNOSIS — R2681 Unsteadiness on feet: Secondary | ICD-10-CM | POA: Diagnosis not present

## 2022-07-01 DIAGNOSIS — R1312 Dysphagia, oropharyngeal phase: Secondary | ICD-10-CM | POA: Diagnosis not present

## 2022-07-01 DIAGNOSIS — M25561 Pain in right knee: Secondary | ICD-10-CM | POA: Diagnosis not present

## 2022-07-01 DIAGNOSIS — R29898 Other symptoms and signs involving the musculoskeletal system: Secondary | ICD-10-CM | POA: Diagnosis not present

## 2022-07-02 DIAGNOSIS — M25561 Pain in right knee: Secondary | ICD-10-CM | POA: Diagnosis not present

## 2022-07-02 DIAGNOSIS — R2681 Unsteadiness on feet: Secondary | ICD-10-CM | POA: Diagnosis not present

## 2022-07-02 DIAGNOSIS — R29898 Other symptoms and signs involving the musculoskeletal system: Secondary | ICD-10-CM | POA: Diagnosis not present

## 2022-07-02 DIAGNOSIS — R1312 Dysphagia, oropharyngeal phase: Secondary | ICD-10-CM | POA: Diagnosis not present

## 2022-07-02 DIAGNOSIS — S079XXD Crushing injury of head, part unspecified, subsequent encounter: Secondary | ICD-10-CM | POA: Diagnosis not present

## 2022-07-05 ENCOUNTER — Encounter: Payer: Self-pay | Admitting: Nurse Practitioner

## 2022-07-05 DIAGNOSIS — R29898 Other symptoms and signs involving the musculoskeletal system: Secondary | ICD-10-CM | POA: Diagnosis not present

## 2022-07-05 DIAGNOSIS — M25561 Pain in right knee: Secondary | ICD-10-CM | POA: Diagnosis not present

## 2022-07-05 DIAGNOSIS — R2681 Unsteadiness on feet: Secondary | ICD-10-CM | POA: Diagnosis not present

## 2022-07-05 DIAGNOSIS — S079XXD Crushing injury of head, part unspecified, subsequent encounter: Secondary | ICD-10-CM | POA: Diagnosis not present

## 2022-07-05 DIAGNOSIS — R1312 Dysphagia, oropharyngeal phase: Secondary | ICD-10-CM | POA: Diagnosis not present

## 2022-07-05 NOTE — Progress Notes (Signed)
This encounter was created in error - please disregard.

## 2022-07-06 ENCOUNTER — Non-Acute Institutional Stay (SKILLED_NURSING_FACILITY): Payer: Medicare Other | Admitting: Family Medicine

## 2022-07-06 DIAGNOSIS — E785 Hyperlipidemia, unspecified: Secondary | ICD-10-CM

## 2022-07-06 DIAGNOSIS — R2681 Unsteadiness on feet: Secondary | ICD-10-CM | POA: Diagnosis not present

## 2022-07-06 DIAGNOSIS — R29898 Other symptoms and signs involving the musculoskeletal system: Secondary | ICD-10-CM | POA: Diagnosis not present

## 2022-07-06 DIAGNOSIS — I10 Essential (primary) hypertension: Secondary | ICD-10-CM

## 2022-07-06 DIAGNOSIS — Z86711 Personal history of pulmonary embolism: Secondary | ICD-10-CM | POA: Diagnosis not present

## 2022-07-06 DIAGNOSIS — S42401A Unspecified fracture of lower end of right humerus, initial encounter for closed fracture: Secondary | ICD-10-CM | POA: Diagnosis not present

## 2022-07-06 DIAGNOSIS — I480 Paroxysmal atrial fibrillation: Secondary | ICD-10-CM

## 2022-07-06 DIAGNOSIS — M25561 Pain in right knee: Secondary | ICD-10-CM | POA: Diagnosis not present

## 2022-07-06 DIAGNOSIS — R1312 Dysphagia, oropharyngeal phase: Secondary | ICD-10-CM | POA: Diagnosis not present

## 2022-07-06 DIAGNOSIS — S079XXD Crushing injury of head, part unspecified, subsequent encounter: Secondary | ICD-10-CM | POA: Diagnosis not present

## 2022-07-06 NOTE — Progress Notes (Signed)
Provider:  Alain Honey, MD Location:      Place of Service:     PCP: Laurey Morale, MD Patient Care Team: Laurey Morale, MD as PCP - General Troy Sine, MD as PCP - Cardiology (Cardiology) Clent Jacks, MD as Consulting Physician (Ophthalmology) Zadie Rhine Clent Demark, MD as Consulting Physician (Ophthalmology)  Extended Emergency Contact Information Primary Emergency Contact: Derenda Fennel [place] of [place] Phone: [phone] Mobile Phone: [phone] Relation: Daughter Secondary Emergency Contact: Horatio Pel [place] of [place] Phone: [phone] Relation: Daughter  Code Status:  Goals of Care: Advanced Directive information    06/15/2022    9:14 AM  Advanced Directives  Does Patient Have a Medical Advance Directive? Yes  Type of Paramedic of [place];Living will  Does patient want to make changes to medical advance directive? No - Patient declined  Copy of [place] in Chart? Yes - validated most recent copy scanned in chart (See row information)      No chief complaint on file.   HPI: Patient is a 89 y.o. male seen today for medical management of chronic problems including fracture of right humerus, currently in physical therapy, paroxysmal atrial fibs, pulmonary embolism, hyperlipidemia, idiopathic neuropathy, and high blood pressure. He seems to be progressing well in physical therapy.  I have seen him graduate from splint to cast to now and elbow brace with hinges. He did have a recent fall when he tipped his scooter over trying to pick up toothpaste lid on the floor.  Suffered a skin tear on right ha today nd finger. He is anxious to get back to his apartment and assisted living and voices no other problems today. He has A-fib and is treated with metoprolol and Eliquis.  He is on atorvastatin for his lipids and gabapentin for peripheral neuropathy.  Past Medical History:  Diagnosis  Date   Arthritis    Diverticulosis of colon    External thrombosed hemorrhoids 06/11/2009   Qualifier: Diagnosis of  By: Sarajane Jews MD, Ishmael Holter    GERD (gastroesophageal reflux disease)    Hypertension    Past Surgical History:  Procedure Laterality Date   cataract extaction, right  6/09   per Dr. [name]   CATARACT EXTRACTION Left    COLONOSCOPY  09/23/04   per Dr. [name], clear, no repeats needed    CORONARY STENT INTERVENTION N/A 06/06/2021   Procedure: CORONARY STENT INTERVENTION;  Surgeon: [name], MD;  Location: [facility];  Service: Cardiovascular;  Laterality: N/A;   CORONARY/GRAFT ACUTE MI REVASCULARIZATION N/A 06/06/2021   Procedure: Coronary/Graft Acute MI Revascularization;  Surgeon: [name], MD;  Location: [facility];  Service: Cardiovascular;  Laterality: N/A;   EGD and esophageal diatation,  05-10-11   per Dr. [name]     HERNIA REPAIR     LEFT HEART CATH AND CORONARY ANGIOGRAPHY N/A 06/06/2021   Procedure: LEFT HEART CATH AND CORONARY ANGIOGRAPHY;  Surgeon: [name], MD;  Location: [facility];  Service: Cardiovascular;  Laterality: N/A;   rt inguinal hernia     x2    reports that he has quit smoking. His smoking use included cigarettes. He has a 7.50 pack-year smoking history. He has never used smokeless tobacco. He reports that he does not drink alcohol and does not use drugs. Social History   Socioeconomic History   Marital status: Widowed    Spouse name: Not on file   Number of children:  3   Years of education: Not on file   Highest education level: Not on file  Occupational History   Occupation: retired  Tobacco Use   Smoking status: Former    Packs/day: 15.00    Years: 0.50    Total pack years: 7.50    Types: Cigarettes   Smokeless tobacco: Never   Tobacco comments:    smoked 18 and stopped and states he stopped 30's   Vaping Use   Vaping Use: Never used  Substance and Sexual Activity   Alcohol use: No     Alcohol/week: 0.0 standard drinks of alcohol   Drug use: No   Sexual activity: Never    Birth control/protection: Abstinence  Other Topics Concern   Not on file  Social History Narrative   Lives in studio at [facility] since 2009, was married x 57 years.    3 daughters.    Social Determinants of Health   Financial Resource Strain: Low Risk  (06/23/2022)   Overall Financial Resource Strain (CARDIA)    Difficulty of Paying Living Expenses: Not hard at all  Food Insecurity: No Food Insecurity (06/23/2022)   Hunger Vital Sign    Worried About Running Out of Food in the Last Year: Never true    Ran Out of Food in the Last Year: Never true  Transportation Needs: No Transportation Needs (06/23/2022)   PRAPARE - Hydrologist (Medical): No    Lack of Transportation (Non-Medical): No  Physical Activity: Sufficiently Active (06/23/2022)   Exercise Vital Sign    Days of Exercise per Week: 4 days    Minutes of Exercise per Session: 60 min  Stress: No Stress Concern Present (06/23/2022)   High [place]    Feeling of Stress : Not at all  Social Connections: Moderately Integrated (06/23/2022)   Social Connection and Isolation Panel [NHANES]    Frequency of Communication with Friends and Family: More than three times a week    Frequency of Social Gatherings with Friends and Family: More than three times a week    Attends Religious Services: More than 4 times per year    Active Member of [employer] or Organizations: Yes    Attends Archivist Meetings: More than 4 times per year    Marital Status: Widowed  Intimate Partner Violence: Not At Risk (06/23/2022)   Humiliation, Afraid, Rape, and Kick questionnaire    Fear of Current or Ex-Partner: No    Emotionally Abused: No    Physically Abused: No    Sexually Abused: No    Functional Status Survey:    Family History  Problem Relation Age  of Onset   Coronary artery disease Father    Colon cancer Neg Hx    Stomach cancer Neg Hx    Esophageal cancer Neg Hx    Pancreatic cancer Neg Hx     Health Maintenance  Topic Date Due   COVID-19 Vaccine (6 - 2023-24 season) 07/26/2022 (Originally 04/22/2022)   HEMOGLOBIN A1C  11/10/2022   FOOT EXAM  03/09/2023   Medicare Annual Wellness (AWV)  06/24/2023   DTaP/Tdap/Td (3 - Td or Tdap) 07/07/2031   Pneumonia Vaccine 17+ Years old  Completed   INFLUENZA VACCINE  Completed   Zoster Vaccines- Shingrix  Completed   HPV VACCINES  Aged Out   OPHTHALMOLOGY EXAM  Discontinued    Allergies  Allergen Reactions   Contrast Media [Iodinated  Contrast Media] Rash    Outpatient Encounter Medications as of 07/06/2022  Medication Sig   acetaminophen (TYLENOL) 325 MG tablet Take 650 mg by mouth 2 (two) times daily.   apixaban (ELIQUIS) 5 MG TABS tablet Take 1 tablet (5 mg total) by mouth 2 (two) times daily.   atorvastatin (LIPITOR) 40 MG tablet Take 1 tablet (40 mg total) by mouth daily.   clopidogrel (PLAVIX) 75 MG tablet Take 1 tablet (75 mg total) by mouth daily. Take 2 tablets (150 mg) on the first day (12/24/21)   docusate sodium (COLACE) 100 MG capsule Take 100 mg by mouth 2 (two) times daily.   gabapentin (NEURONTIN) 300 MG capsule TAKE 1 CAPSULE BY MOUTH THREE TIMES A DAY   guaiFENesin (MUCINEX) 600 MG 12 hr tablet Take 1,200 mg by mouth 2 (two) times daily as needed.   guaifenesin (ROBITUSSIN) 100 MG/5ML syrup Take 5 mLs by mouth every 6 (six) hours as needed for cough.   metoprolol tartrate (LOPRESSOR) 25 MG tablet Take 1 tablet (25 mg) by mouth in the AM, then take 0.5 tablet (12.5 mg) by mouth in the PM.   nitroGLYCERIN (NITROSTAT) 0.4 MG SL tablet Place 1 tablet (0.4 mg total) under the tongue every 5 (five) minutes as needed for chest pain.   pantoprazole (PROTONIX) 40 MG tablet Take 1 tablet (40 mg total) by mouth daily.   potassium chloride (KLOR-CON 10) 10 MEQ tablet Take 1  tablet (10 mEq total) by mouth daily.   traMADol (ULTRAM) 50 MG tablet Take 50 mg by mouth every 12 (twelve) hours as needed for severe pain.   vitamin C (ASCORBIC ACID) 500 MG tablet Take 500 mg by mouth daily. Reported on 10/27/2015   No facility-administered encounter medications on file as of 07/06/2022.    Review of Systems  Constitutional: Negative.   HENT: Negative.    Respiratory: Negative.    Cardiovascular: Negative.   Gastrointestinal: Negative.   Musculoskeletal:  Positive for gait problem.  Psychiatric/Behavioral: Negative.    All other systems reviewed and are negative.   There were no vitals filed for this visit. There is no height or weight on file to calculate BMI. Physical Exam Vitals and nursing note reviewed.  Constitutional:      Appearance: Normal appearance.  HENT:     Mouth/Throat:     Mouth: Mucous membranes are moist.     Pharynx: Oropharynx is clear.  Eyes:     Pupils: Pupils are equal, round, and reactive to light.  Cardiovascular:     Rate and Rhythm: Normal rate. Rhythm irregular.  Pulmonary:     Effort: Pulmonary effort is normal.     Breath sounds: Normal breath sounds.  Abdominal:     General: Bowel sounds are normal.     Palpations: Abdomen is soft.  Neurological:     General: No focal deficit present.     Mental Status: He is alert and oriented to person, place, and time.  Psychiatric:        Mood and Affect: Mood normal.        Behavior: Behavior normal.     Labs reviewed: Basic Metabolic Panel: Recent Labs    11/02/21 1640 11/02/21 1648 11/02/21 2130 11/03/21 0537 11/04/21 0548 03/23/22 2255 03/30/22 0000 05/11/22 0800  NA 132*  --  139 133*  132* 134* 132* 131* 134*  K 3.7  --  2.4* 4.1  4.0 3.8 4.1 4.4 4.0  CL 105  --  120* 104  104 108 101 98* 100  CO2 18*  --  12* 21*  21* 19* 23 26* 27*  GLUCOSE 116*  --  97 155*  162* 154* 148*  --   --   BUN 18  --  '11 16  16 16 16 13 10  '$ CREATININE 0.72  --  0.36* 0.51*   0.52* 0.51* 0.77 0.5* 0.6  CALCIUM 8.5*  --  5.6* 8.3*  8.3* 8.0* 8.5* 8.5* 8.8  MG 1.9  --  1.3* 1.9  --   --   --   --   PHOS  --  3.3  --  4.4  --   --   --   --    Liver Function Tests: Recent Labs    11/02/21 2130 11/03/21 0537 11/04/21 0548  AST 45* 61* 63*  ALT 24 39 37  ALKPHOS 52 83 74  BILITOT 1.3* 1.5* 0.8  PROT 3.7* 5.9* 5.2*  ALBUMIN 1.8* 3.1* 2.7*   No results for input(s): "LIPASE", "AMYLASE" in the last 8760 hours. No results for input(s): "AMMONIA" in the last 8760 hours. CBC: Recent Labs    11/02/21 1640 11/03/21 0537 11/03/21 1737 11/04/21 0548 11/05/21 0450 03/23/22 2255 03/30/22 0000  WBC 7.0 3.8*   < > 9.7 9.0 5.0 7.2  NEUTROABS 4.7 2.9  --   --   --  3.3  --   HGB 12.3* 12.7*   < > 11.4* 11.1* 12.8* 12.7*  HCT 35.9* 37.0*   < > 33.3* 32.7* 38.5* 37*  MCV 93.5 92.7   < > 93.0 94.0 94.1  --   PLT 177 163   < > 157 162 216 356   < > = values in this interval not displayed.   Cardiac Enzymes: Recent Labs    11/02/21 1855 11/03/21 0537  CKTOTAL 495* 382   BNP: Invalid input(s): "POCBNP" Lab Results  Component Value Date   HGBA1C 5.3 05/12/2022   Lab Results  Component Value Date   TSH 2.039 11/02/2021   Lab Results  Component Value Date   [id number] 02/28/2020   No results found for: "FOLATE" Lab Results  Component Value Date   FERRITIN 256 11/03/2021    Imaging and Procedures obtained prior to SNF admission: CT Angio Chest Pulmonary Embolism (PE) W or WO Contrast  Result Date: 05/12/2022 CLINICAL DATA:  Pleural effusion, history of blood clotting disorder, question pulmonary embolism EXAM: CT ANGIOGRAPHY CHEST WITH CONTRAST TECHNIQUE: Multidetector CT imaging of the chest was performed using the standard protocol during bolus administration of intravenous contrast. Multiplanar CT image reconstructions and MIPs were obtained to evaluate the vascular anatomy. Due to history of contrast allergy patient was premedicated for  contrast administration. Patient suffered no reaction to IV contrast. RADIATION DOSE REDUCTION: This exam was performed according to the departmental dose-optimization program which includes automated exposure control, adjustment of the mA and/or kV according to patient size and/or use of iterative reconstruction technique. CONTRAST:  17m OMNIPAQUE IOHEXOL 350 MG/ML SOLN IV COMPARISON:  11/03/2021 FINDINGS: Cardiovascular: Atherosclerotic calcifications aorta, proximal great vessels, and coronary arteries. Aorta normal caliber without aneurysm or dissection. No pericardial effusion. Minimal scattered pericardial calcification noted. Pulmonary arteries adequately opacified and patent. No evidence of pulmonary embolism. Mediastinum/Nodes: Large hiatal hernia. Esophagus unremarkable. Base of cervical region normal appearance. No thoracic adenopathy. Lungs/Pleura: Dependent atelectasis in the posterior lungs bilaterally. Lungs otherwise clear. No pulmonary infiltrate, pleural effusion, or pneumothorax. Upper Abdomen: Cholelithiasis. Simple appearing cyst medial upper RIGHT  kidney 3.0 x 2.5 cm; no follow-up imaging recommended. Diverticulosis at splenic flexure of colon. Musculoskeletal: No acute osseous findings. Review of the MIP images confirms the above findings. IMPRESSION: No evidence of pulmonary embolism. Large hiatal hernia. Dependent atelectasis in the posterior lungs bilaterally. Cholelithiasis. Diverticulosis at splenic flexure of colon. Scattered atherosclerotic calcifications including coronary arteries. Aortic Atherosclerosis (ICD10-I70.0). Electronically Signed   By: Lavonia Dana M.D.   On: 05/12/2022 15:55    Assessment/Plan 1. Essential hypertension  Blood pressure managed with metoprolol which also serves to keep his heart rate and check 2. Closed fracture of distal end of right humerus, unspecified fracture morphology, initial encounter Orthopedic surgeon.  Therapy is progressing as  anticipated  3. History of pulmonary embolism Patient will continue to take Eliquis the rest of his life.  4. Hyperlipidemia, unspecified hyperlipidemia type On 40 mg of atorvastatin lipids are in excellent shape when last checked about 8 months ago  5. Paroxysmal atrial fibrillation (HCC) Again on rate controlling drug as well as anticoagulant appears to be in sinus rhythm today    Family/ staff Communication:   Labs/tests ordered:none  Lillette Boxer. Sabra Heck, [place] [address] [place], [place] Office [phone]

## 2022-07-07 DIAGNOSIS — M25561 Pain in right knee: Secondary | ICD-10-CM | POA: Diagnosis not present

## 2022-07-07 DIAGNOSIS — S079XXD Crushing injury of head, part unspecified, subsequent encounter: Secondary | ICD-10-CM | POA: Diagnosis not present

## 2022-07-07 DIAGNOSIS — R2681 Unsteadiness on feet: Secondary | ICD-10-CM | POA: Diagnosis not present

## 2022-07-07 DIAGNOSIS — R1312 Dysphagia, oropharyngeal phase: Secondary | ICD-10-CM | POA: Diagnosis not present

## 2022-07-07 DIAGNOSIS — R29898 Other symptoms and signs involving the musculoskeletal system: Secondary | ICD-10-CM | POA: Diagnosis not present

## 2022-07-08 ENCOUNTER — Other Ambulatory Visit: Payer: Self-pay | Admitting: Nurse Practitioner

## 2022-07-08 DIAGNOSIS — S079XXD Crushing injury of head, part unspecified, subsequent encounter: Secondary | ICD-10-CM | POA: Diagnosis not present

## 2022-07-08 DIAGNOSIS — R1312 Dysphagia, oropharyngeal phase: Secondary | ICD-10-CM | POA: Diagnosis not present

## 2022-07-08 DIAGNOSIS — M25561 Pain in right knee: Secondary | ICD-10-CM | POA: Diagnosis not present

## 2022-07-08 DIAGNOSIS — R29898 Other symptoms and signs involving the musculoskeletal system: Secondary | ICD-10-CM | POA: Diagnosis not present

## 2022-07-08 DIAGNOSIS — R2681 Unsteadiness on feet: Secondary | ICD-10-CM | POA: Diagnosis not present

## 2022-07-09 DIAGNOSIS — S079XXD Crushing injury of head, part unspecified, subsequent encounter: Secondary | ICD-10-CM | POA: Diagnosis not present

## 2022-07-09 DIAGNOSIS — R1312 Dysphagia, oropharyngeal phase: Secondary | ICD-10-CM | POA: Diagnosis not present

## 2022-07-09 DIAGNOSIS — R29898 Other symptoms and signs involving the musculoskeletal system: Secondary | ICD-10-CM | POA: Diagnosis not present

## 2022-07-09 DIAGNOSIS — M25561 Pain in right knee: Secondary | ICD-10-CM | POA: Diagnosis not present

## 2022-07-09 DIAGNOSIS — R2681 Unsteadiness on feet: Secondary | ICD-10-CM | POA: Diagnosis not present

## 2022-07-12 DIAGNOSIS — R2681 Unsteadiness on feet: Secondary | ICD-10-CM | POA: Diagnosis not present

## 2022-07-12 DIAGNOSIS — S42411A Displaced simple supracondylar fracture without intercondylar fracture of right humerus, initial encounter for closed fracture: Secondary | ICD-10-CM | POA: Diagnosis not present

## 2022-07-12 DIAGNOSIS — R1312 Dysphagia, oropharyngeal phase: Secondary | ICD-10-CM | POA: Diagnosis not present

## 2022-07-12 DIAGNOSIS — M25561 Pain in right knee: Secondary | ICD-10-CM | POA: Diagnosis not present

## 2022-07-12 DIAGNOSIS — R29898 Other symptoms and signs involving the musculoskeletal system: Secondary | ICD-10-CM | POA: Diagnosis not present

## 2022-07-12 DIAGNOSIS — S079XXD Crushing injury of head, part unspecified, subsequent encounter: Secondary | ICD-10-CM | POA: Diagnosis not present

## 2022-07-13 DIAGNOSIS — R2681 Unsteadiness on feet: Secondary | ICD-10-CM | POA: Diagnosis not present

## 2022-07-13 DIAGNOSIS — R1312 Dysphagia, oropharyngeal phase: Secondary | ICD-10-CM | POA: Diagnosis not present

## 2022-07-13 DIAGNOSIS — M25561 Pain in right knee: Secondary | ICD-10-CM | POA: Diagnosis not present

## 2022-07-13 DIAGNOSIS — R29898 Other symptoms and signs involving the musculoskeletal system: Secondary | ICD-10-CM | POA: Diagnosis not present

## 2022-07-13 DIAGNOSIS — S079XXD Crushing injury of head, part unspecified, subsequent encounter: Secondary | ICD-10-CM | POA: Diagnosis not present

## 2022-07-14 DIAGNOSIS — M25561 Pain in right knee: Secondary | ICD-10-CM | POA: Diagnosis not present

## 2022-07-14 DIAGNOSIS — R29898 Other symptoms and signs involving the musculoskeletal system: Secondary | ICD-10-CM | POA: Diagnosis not present

## 2022-07-14 DIAGNOSIS — R1312 Dysphagia, oropharyngeal phase: Secondary | ICD-10-CM | POA: Diagnosis not present

## 2022-07-14 DIAGNOSIS — R2681 Unsteadiness on feet: Secondary | ICD-10-CM | POA: Diagnosis not present

## 2022-07-14 DIAGNOSIS — S079XXD Crushing injury of head, part unspecified, subsequent encounter: Secondary | ICD-10-CM | POA: Diagnosis not present

## 2022-07-15 DIAGNOSIS — M25561 Pain in right knee: Secondary | ICD-10-CM | POA: Diagnosis not present

## 2022-07-15 DIAGNOSIS — R1312 Dysphagia, oropharyngeal phase: Secondary | ICD-10-CM | POA: Diagnosis not present

## 2022-07-15 DIAGNOSIS — S079XXD Crushing injury of head, part unspecified, subsequent encounter: Secondary | ICD-10-CM | POA: Diagnosis not present

## 2022-07-15 DIAGNOSIS — R29898 Other symptoms and signs involving the musculoskeletal system: Secondary | ICD-10-CM | POA: Diagnosis not present

## 2022-07-15 DIAGNOSIS — R2681 Unsteadiness on feet: Secondary | ICD-10-CM | POA: Diagnosis not present

## 2022-07-16 DIAGNOSIS — M25561 Pain in right knee: Secondary | ICD-10-CM | POA: Diagnosis not present

## 2022-07-16 DIAGNOSIS — R2681 Unsteadiness on feet: Secondary | ICD-10-CM | POA: Diagnosis not present

## 2022-07-16 DIAGNOSIS — R1312 Dysphagia, oropharyngeal phase: Secondary | ICD-10-CM | POA: Diagnosis not present

## 2022-07-16 DIAGNOSIS — R29898 Other symptoms and signs involving the musculoskeletal system: Secondary | ICD-10-CM | POA: Diagnosis not present

## 2022-07-16 DIAGNOSIS — S079XXD Crushing injury of head, part unspecified, subsequent encounter: Secondary | ICD-10-CM | POA: Diagnosis not present

## 2022-07-19 DIAGNOSIS — S079XXD Crushing injury of head, part unspecified, subsequent encounter: Secondary | ICD-10-CM | POA: Diagnosis not present

## 2022-07-19 DIAGNOSIS — R29898 Other symptoms and signs involving the musculoskeletal system: Secondary | ICD-10-CM | POA: Diagnosis not present

## 2022-07-19 DIAGNOSIS — R1312 Dysphagia, oropharyngeal phase: Secondary | ICD-10-CM | POA: Diagnosis not present

## 2022-07-19 DIAGNOSIS — R2681 Unsteadiness on feet: Secondary | ICD-10-CM | POA: Diagnosis not present

## 2022-07-19 DIAGNOSIS — M25561 Pain in right knee: Secondary | ICD-10-CM | POA: Diagnosis not present

## 2022-07-20 DIAGNOSIS — R29898 Other symptoms and signs involving the musculoskeletal system: Secondary | ICD-10-CM | POA: Diagnosis not present

## 2022-07-20 DIAGNOSIS — R1312 Dysphagia, oropharyngeal phase: Secondary | ICD-10-CM | POA: Diagnosis not present

## 2022-07-20 DIAGNOSIS — M25561 Pain in right knee: Secondary | ICD-10-CM | POA: Diagnosis not present

## 2022-07-20 DIAGNOSIS — R2681 Unsteadiness on feet: Secondary | ICD-10-CM | POA: Diagnosis not present

## 2022-07-20 DIAGNOSIS — S079XXD Crushing injury of head, part unspecified, subsequent encounter: Secondary | ICD-10-CM | POA: Diagnosis not present

## 2022-07-21 DIAGNOSIS — R1312 Dysphagia, oropharyngeal phase: Secondary | ICD-10-CM | POA: Diagnosis not present

## 2022-07-21 DIAGNOSIS — M25561 Pain in right knee: Secondary | ICD-10-CM | POA: Diagnosis not present

## 2022-07-21 DIAGNOSIS — R2681 Unsteadiness on feet: Secondary | ICD-10-CM | POA: Diagnosis not present

## 2022-07-21 DIAGNOSIS — S079XXD Crushing injury of head, part unspecified, subsequent encounter: Secondary | ICD-10-CM | POA: Diagnosis not present

## 2022-07-21 DIAGNOSIS — R29898 Other symptoms and signs involving the musculoskeletal system: Secondary | ICD-10-CM | POA: Diagnosis not present

## 2022-07-22 DIAGNOSIS — R2681 Unsteadiness on feet: Secondary | ICD-10-CM | POA: Diagnosis not present

## 2022-07-22 DIAGNOSIS — R29898 Other symptoms and signs involving the musculoskeletal system: Secondary | ICD-10-CM | POA: Diagnosis not present

## 2022-07-22 DIAGNOSIS — S079XXD Crushing injury of head, part unspecified, subsequent encounter: Secondary | ICD-10-CM | POA: Diagnosis not present

## 2022-07-22 DIAGNOSIS — M25561 Pain in right knee: Secondary | ICD-10-CM | POA: Diagnosis not present

## 2022-07-22 DIAGNOSIS — R1312 Dysphagia, oropharyngeal phase: Secondary | ICD-10-CM | POA: Diagnosis not present

## 2022-07-23 DIAGNOSIS — R2681 Unsteadiness on feet: Secondary | ICD-10-CM | POA: Diagnosis not present

## 2022-07-23 DIAGNOSIS — R29898 Other symptoms and signs involving the musculoskeletal system: Secondary | ICD-10-CM | POA: Diagnosis not present

## 2022-07-23 DIAGNOSIS — M25561 Pain in right knee: Secondary | ICD-10-CM | POA: Diagnosis not present

## 2022-07-23 DIAGNOSIS — S079XXD Crushing injury of head, part unspecified, subsequent encounter: Secondary | ICD-10-CM | POA: Diagnosis not present

## 2022-07-23 DIAGNOSIS — R1312 Dysphagia, oropharyngeal phase: Secondary | ICD-10-CM | POA: Diagnosis not present

## 2022-07-26 ENCOUNTER — Non-Acute Institutional Stay (SKILLED_NURSING_FACILITY): Payer: Medicare Other

## 2022-07-26 DIAGNOSIS — I480 Paroxysmal atrial fibrillation: Secondary | ICD-10-CM

## 2022-07-26 DIAGNOSIS — I2510 Atherosclerotic heart disease of native coronary artery without angina pectoris: Secondary | ICD-10-CM

## 2022-07-26 DIAGNOSIS — R2681 Unsteadiness on feet: Secondary | ICD-10-CM | POA: Diagnosis not present

## 2022-07-26 DIAGNOSIS — R29898 Other symptoms and signs involving the musculoskeletal system: Secondary | ICD-10-CM | POA: Diagnosis not present

## 2022-07-26 DIAGNOSIS — M25561 Pain in right knee: Secondary | ICD-10-CM | POA: Diagnosis not present

## 2022-07-26 DIAGNOSIS — E871 Hypo-osmolality and hyponatremia: Secondary | ICD-10-CM

## 2022-07-26 DIAGNOSIS — Z9861 Coronary angioplasty status: Secondary | ICD-10-CM | POA: Diagnosis not present

## 2022-07-26 DIAGNOSIS — I10 Essential (primary) hypertension: Secondary | ICD-10-CM

## 2022-07-26 DIAGNOSIS — E785 Hyperlipidemia, unspecified: Secondary | ICD-10-CM

## 2022-07-26 DIAGNOSIS — R1312 Dysphagia, oropharyngeal phase: Secondary | ICD-10-CM | POA: Diagnosis not present

## 2022-07-26 DIAGNOSIS — I5189 Other ill-defined heart diseases: Secondary | ICD-10-CM | POA: Diagnosis not present

## 2022-07-26 DIAGNOSIS — Z86711 Personal history of pulmonary embolism: Secondary | ICD-10-CM

## 2022-07-26 DIAGNOSIS — G609 Hereditary and idiopathic neuropathy, unspecified: Secondary | ICD-10-CM

## 2022-07-26 DIAGNOSIS — M6281 Muscle weakness (generalized): Secondary | ICD-10-CM | POA: Diagnosis not present

## 2022-07-26 DIAGNOSIS — M542 Cervicalgia: Secondary | ICD-10-CM | POA: Diagnosis not present

## 2022-07-26 DIAGNOSIS — K219 Gastro-esophageal reflux disease without esophagitis: Secondary | ICD-10-CM | POA: Diagnosis not present

## 2022-07-26 DIAGNOSIS — E876 Hypokalemia: Secondary | ICD-10-CM | POA: Diagnosis not present

## 2022-07-26 NOTE — Assessment & Plan Note (Signed)
compensated 

## 2022-07-26 NOTE — Assessment & Plan Note (Signed)
Heart rate is in control, on Eliquis, Metoprolol 

## 2022-07-26 NOTE — Progress Notes (Signed)
Location:  [facility] Room Number: 65A Place of Service:  SNF [phone]) Provider:  Allard Lightsey X Shaquel Josephson,NP  Laurey Morale, MD  Patient Care Team: Laurey Morale, MD as PCP - General Claiborne Billings Joyice Faster, MD as PCP - Cardiology (Cardiology) Clent Jacks, MD as Consulting Physician (Ophthalmology) Zadie Rhine Clent Demark, MD as Consulting Physician (Ophthalmology)  Extended Emergency Contact Information Primary Emergency Contact: Derenda Fennel [place] of [place] Phone: [phone] Mobile Phone: [phone] Relation: Daughter Secondary Emergency Contact: Horatio Pel [place] of [place] Phone: [phone] Relation: Daughter  Code Status:  Full [place] Goals of care: Advanced Directive information    07/26/2022   12:29 PM  Advanced Directives  Does Patient Have a Medical Advance Directive? Yes  Type of Paramedic of [place];Living will  Does patient want to make changes to medical advance directive? No - Patient declined  Copy of [place] in Chart? Yes - validated most recent copy scanned in chart (See row information)     Chief Complaint  Patient presents with   Acute Visit    Patient is being seen for neck pain when turning left    HPI:  Pt is a 89 y.o. male seen today for an acute visit for left neck pain when head turns to the left, no skin breakdown, no injury, trigger point of pain is in the muscle.    03/24/23 right elbow fx(distal end of right humerus), mild swelling fingers, pain is well controlled, in hinged elbow ROM brace             CAD ST elevation MI LAD 05/2021, underwent Cath, stenting, s/p percutaneous coronary angioplasty, started DAPT, Echo EF 40-45%,  65-70% 11/02/21, started Plavix 12/24/21 per [facility]             PE 11/02/21, started Eliquis, CTA 05/12/22             Grade 2 diastolic dysfunction, compensated             Afib, on Eliquis, Metoprolol             GERD, thing liquid, hx  of dilatation,  takes Pantoprazole, Hgb 12.7 03/30/22             Hypokalemia, on Kcl K 4.0 05/11/22             Hyponatremia, Na 134 05/11/22             HLD on Atorvastatin             HTN, on Metoprolol             Peripheral neuropathy, on Gabapentin   Past Medical History:  Diagnosis Date   Arthritis    Diverticulosis of colon    External thrombosed hemorrhoids 06/11/2009   Qualifier: Diagnosis of  By: Sarajane Jews MD, Ishmael Holter    GERD (gastroesophageal reflux disease)    Hypertension    Past Surgical History:  Procedure Laterality Date   cataract extaction, right  6/09   per Dr. [name]   CATARACT EXTRACTION Left    COLONOSCOPY  09/23/04   per Dr. [name], clear, no repeats needed    CORONARY STENT INTERVENTION N/A 06/06/2021   Procedure: CORONARY STENT INTERVENTION;  Surgeon: [name], MD;  Location: [facility];  Service: Cardiovascular;  Laterality: N/A;   CORONARY/GRAFT ACUTE MI REVASCULARIZATION N/A 06/06/2021   Procedure: Coronary/Graft Acute MI Revascularization;  Surgeon: [name], MD;  Location: [facility];  Service: Cardiovascular;  Laterality: N/A;   EGD and esophageal diatation,  05-10-11   per Dr. [name]     HERNIA REPAIR     LEFT HEART CATH AND CORONARY ANGIOGRAPHY N/A 06/06/2021   Procedure: LEFT HEART CATH AND CORONARY ANGIOGRAPHY;  Surgeon: [name], MD;  Location: [facility];  Service: Cardiovascular;  Laterality: N/A;   rt inguinal hernia     x2    Allergies  Allergen Reactions   Contrast Media [Iodinated Contrast Media] Rash    Outpatient Encounter Medications as of 07/26/2022  Medication Sig   acetaminophen (TYLENOL) 325 MG tablet Take 650 mg by mouth 2 (two) times daily.   apixaban (ELIQUIS) 5 MG TABS tablet Take 1 tablet (5 mg total) by mouth 2 (two) times daily.   atorvastatin (LIPITOR) 40 MG tablet Take 1 tablet (40 mg total) by mouth daily.   clopidogrel (PLAVIX) 75 MG tablet Take 1 tablet (75 mg total) by mouth  daily. Take 2 tablets (150 mg) on the first day (12/24/21)   docusate sodium (COLACE) 100 MG capsule Take 100 mg by mouth 2 (two) times daily.   gabapentin (NEURONTIN) 300 MG capsule TAKE 1 CAPSULE BY MOUTH THREE TIMES A DAY   lidocaine (LIDODERM) 5 % Place 1 patch onto the skin daily. Remove & Discard patch within 12 hours or as directed by MD   metoprolol tartrate (LOPRESSOR) 25 MG tablet Take 1 tablet (25 mg) by mouth in the AM, then take 0.5 tablet (12.5 mg) by mouth in the PM.   pantoprazole (PROTONIX) 40 MG tablet Take 1 tablet (40 mg total) by mouth daily.   potassium chloride (KLOR-CON 10) 10 MEQ tablet Take 1 tablet (10 mEq total) by mouth daily.   vitamin C (ASCORBIC ACID) 500 MG tablet Take 500 mg by mouth daily. Reported on 10/27/2015   nitroGLYCERIN (NITROSTAT) 0.4 MG SL tablet Place 1 tablet (0.4 mg total) under the tongue every 5 (five) minutes as needed for chest pain.   traMADol (ULTRAM) 50 MG tablet Take 50 mg by mouth every 12 (twelve) hours as needed for severe pain. (Patient not taking: Reported on 07/26/2022)   No facility-administered encounter medications on file as of 07/26/2022.    Review of Systems  Constitutional:  Negative for appetite change, fatigue and fever.  HENT:  Negative for congestion and trouble swallowing.   Eyes:  Negative for visual disturbance.  Respiratory:  Negative for cough and shortness of breath.   Cardiovascular:  Negative for leg swelling.  Gastrointestinal:  Negative for abdominal pain and constipation.  Genitourinary:  Negative for dysuria, frequency and urgency.       Incontinent of urine  Musculoskeletal:  Positive for arthralgias, gait problem, joint swelling, myalgias and neck pain. Negative for neck stiffness.  Skin:  Negative for color change.  Neurological:  Negative for speech difficulty, weakness and headaches.  Psychiatric/Behavioral:  Negative for confusion and sleep disturbance. The patient is not nervous/anxious.      Immunization History  Administered Date(s) Administered   Fluad Quad(high Dose 65+) 02/15/2022   Influenza Split 01/20/2012, 01/07/2013   Influenza Whole 02/09/2006, 01/11/2008   Influenza, High Dose Seasonal PF 12/30/2016, 12/27/2017, 01/08/2019   Influenza,inj,quad, With Preservative 12/29/2018   Influenza-Unspecified 01/17/2014, 01/14/2016, 12/30/2016   PFIZER Comirnaty(Gray Top)Covid-19 Tri-Sucrose Vaccine 05/28/2019, 03/04/2020, 09/23/2020   Pfizer Covid-19 Vaccine Bivalent Booster 82yrs & up 02/25/2022   Pneumococcal Conjugate-13 09/23/2015   Pneumococcal Polysaccharide-23 03/13/2008   Td  12/20/2007   Tdap 07/06/2021   Unspecified SARS-COV-2 Vaccination 04/30/2019   Zoster Recombinat (Shingrix) 03/11/2020, 06/20/2020   Pertinent  Health Maintenance Due  Topic Date Due   HEMOGLOBIN A1C  11/10/2022   INFLUENZA VACCINE  11/25/2022   FOOT EXAM  03/09/2023   OPHTHALMOLOGY EXAM  Discontinued      05/03/2022   12:35 PM 05/04/2022    2:44 PM 06/01/2022   11:04 AM 06/15/2022    9:14 AM 06/23/2022    3:12 PM  Fall Risk  Falls in the past year? 1 1 1 1 1   Was there an injury with Fall? 1 1 1 1 1   Was there an injury with Fall? - Comments     Fx Rt [place]. Followed by Orthopedic  Fall Risk Category Calculator 3 3 3 3 2   Fall Risk Category (Retired) High High     (RETIRED) Patient Fall Risk Level High fall risk High fall risk     Patient at Risk for Falls Due to History of fall(s);Impaired balance/gait;Impaired mobility History of fall(s);Impaired balance/gait;Impaired mobility;Impaired vision History of fall(s);Impaired balance/gait;Impaired mobility;Impaired vision History of fall(s);Impaired balance/gait;Impaired mobility Orthopedic patient  Fall risk Follow up Falls evaluation completed Falls evaluation completed Falls evaluation completed Falls evaluation completed Falls prevention discussed   Functional Status Survey:    Vitals:   07/26/22 1224  BP: (!) 140/70  Pulse: 80   Resp: 18  Temp: 97.7 F (36.5 C)  TempSrc: Temporal  SpO2: 97%  Weight: 152 lb 4.8 oz (69.1 kg)  Height: 5\' 7"  (1.702 m)   Body mass index is 23.85 kg/m. Physical Exam Vitals and nursing note reviewed.  Constitutional:      Appearance: Normal appearance.  HENT:     Head: Normocephalic.     Comments:      Nose: Nose normal.     Mouth/Throat:     Mouth: Mucous membranes are moist.  Eyes:     Extraocular Movements: Extraocular movements intact.     Conjunctiva/sclera: Conjunctivae normal.     Pupils: Pupils are equal, round, and reactive to light.  Cardiovascular:     Rate and Rhythm: Normal rate and regular rhythm.     Heart sounds: No murmur heard. Pulmonary:     Effort: Pulmonary effort is normal.     Breath sounds: Normal breath sounds. No rales.  Abdominal:     General: Bowel sounds are normal.     Palpations: Abdomen is soft.     Tenderness: There is no abdominal tenderness.  Musculoskeletal:        General: Swelling and tenderness present.     Cervical back: Normal range of motion and neck supple.     Comments: Right elbow in a hinged ROM brace,  right fingers mild swelling, able to make a fist, denied pain or numbness. Left neck pain when head turns to the left, trigger point pain noted posterior left neck muscle.   Skin:    General: Skin is warm and dry.  Neurological:     General: No focal deficit present.     Mental Status: He is alert and oriented to person, place, and time. Mental status is at baseline.     Gait: Gait abnormal.  Psychiatric:        Mood and Affect: Mood normal.        Behavior: Behavior normal.        Thought Content: Thought content normal.     Labs reviewed: Recent Labs  11/02/21 1640 11/02/21 1648 11/02/21 2130 11/03/21 0537 11/04/21 0548 03/23/22 2255 03/30/22 0000 05/11/22 0800  NA 132*  --  139 133*  132* 134* 132* 131* 134*  K 3.7  --  2.4* 4.1  4.0 3.8 4.1 4.4 4.0  CL 105  --  120* 104  104 108 101 98* 100  CO2  18*  --  12* 21*  21* 19* 23 26* 27*  GLUCOSE 116*  --  97 155*  162* 154* 148*  --   --   BUN 18  --  11 16  16 16 16 13 10   CREATININE 0.72  --  0.36* 0.51*  0.52* 0.51* 0.77 0.5* 0.6  CALCIUM 8.5*  --  5.6* 8.3*  8.3* 8.0* 8.5* 8.5* 8.8  MG 1.9  --  1.3* 1.9  --   --   --   --   PHOS  --  3.3  --  4.4  --   --   --   --    Recent Labs    11/02/21 2130 11/03/21 0537 11/04/21 0548  AST 45* 61* 63*  ALT 24 39 37  ALKPHOS 52 83 74  BILITOT 1.3* 1.5* 0.8  PROT 3.7* 5.9* 5.2*  ALBUMIN 1.8* 3.1* 2.7*   Recent Labs    11/02/21 1640 11/03/21 0537 11/03/21 1737 11/04/21 0548 11/05/21 0450 03/23/22 2255 03/30/22 0000  WBC 7.0 3.8*   < > 9.7 9.0 5.0 7.2  NEUTROABS 4.7 2.9  --   --   --  3.3  --   HGB 12.3* 12.7*   < > 11.4* 11.1* 12.8* 12.7*  HCT 35.9* 37.0*   < > 33.3* 32.7* 38.5* 37*  MCV 93.5 92.7   < > 93.0 94.0 94.1  --   PLT 177 163   < > 157 162 216 356   < > = values in this interval not displayed.   Lab Results  Component Value Date   TSH 2.039 11/02/2021   Lab Results  Component Value Date   HGBA1C 5.3 05/12/2022   Lab Results  Component Value Date   CHOL 93 10/14/2021   HDL 45.60 10/14/2021   LDLCALC 33 10/14/2021   LDLDIRECT 72.0 09/27/2017   TRIG 71.0 10/14/2021   CHOLHDL 2 10/14/2021    Significant Diagnostic Results in last 30 days:  No results found.  Assessment/Plan Neck pain on left side left neck pain when head turns to the left, no skin breakdown, no injury, trigger point of pain is in the muscle. Will try Lidoderm patch, may warm compress.   Paroxysmal atrial fibrillation (HCC) Heart rate is in control,  on Eliquis, Metoprolol  GERD Stable, thing liquid, hx of dilatation,  takes Pantoprazole, Hgb 12.7 03/30/22  Hypokalemia on Kcl K 4.0 05/11/22  Hyponatremia Na 134 05/11/22  Hyperlipidemia on Atorvastatin  Essential hypertension Blood pressure is controlled,  on Metoprolol  Neuropathy, peripheral  on  Gabapentin  Diastolic dysfunction compensated  History of pulmonary embolism    PE 11/02/21, started Eliquis, CTA 05/12/22     Family/ staff Communication: plan of care reviewed with the patient and charge nurse.   Labs/tests ordered:  none  Time spend 35 minutes.

## 2022-07-26 NOTE — Assessment & Plan Note (Signed)
Stable, thing liquid, hx of dilatation,  takes Pantoprazole, Hgb 12.7 03/30/22

## 2022-07-26 NOTE — Assessment & Plan Note (Signed)
Na 134 05/11/22 

## 2022-07-26 NOTE — Assessment & Plan Note (Signed)
on Kcl K 4.0 05/11/22 

## 2022-07-26 NOTE — Assessment & Plan Note (Signed)
ST elevation MI LAD 05/2021, underwent Cath, stenting, s/p percutaneous coronary angioplasty, started DAPT, Echo EF 40-45%,  65-70% 11/02/21, started Plavix 12/24/21 per [facility] 

## 2022-07-26 NOTE — Assessment & Plan Note (Signed)
Blood pressure is controlled, on Metoprolol 

## 2022-07-26 NOTE — Assessment & Plan Note (Signed)
PE 11/02/21, started Eliquis, CTA 05/12/22 

## 2022-07-26 NOTE — Assessment & Plan Note (Signed)
on Atorvastatin 

## 2022-07-26 NOTE — Assessment & Plan Note (Signed)
left neck pain when head turns to the left, no skin breakdown, no injury, trigger point of pain is in the muscle. Will try Lidoderm patch, may warm compress.

## 2022-07-26 NOTE — Assessment & Plan Note (Signed)
on Gabapentin ?

## 2022-07-27 DIAGNOSIS — R29898 Other symptoms and signs involving the musculoskeletal system: Secondary | ICD-10-CM | POA: Diagnosis not present

## 2022-07-27 DIAGNOSIS — M25561 Pain in right knee: Secondary | ICD-10-CM | POA: Diagnosis not present

## 2022-07-27 DIAGNOSIS — R1312 Dysphagia, oropharyngeal phase: Secondary | ICD-10-CM | POA: Diagnosis not present

## 2022-07-27 DIAGNOSIS — M6281 Muscle weakness (generalized): Secondary | ICD-10-CM | POA: Diagnosis not present

## 2022-07-27 DIAGNOSIS — R2681 Unsteadiness on feet: Secondary | ICD-10-CM | POA: Diagnosis not present

## 2022-07-28 DIAGNOSIS — R29898 Other symptoms and signs involving the musculoskeletal system: Secondary | ICD-10-CM | POA: Diagnosis not present

## 2022-07-28 DIAGNOSIS — R2681 Unsteadiness on feet: Secondary | ICD-10-CM | POA: Diagnosis not present

## 2022-07-28 DIAGNOSIS — R1312 Dysphagia, oropharyngeal phase: Secondary | ICD-10-CM | POA: Diagnosis not present

## 2022-07-28 DIAGNOSIS — M25561 Pain in right knee: Secondary | ICD-10-CM | POA: Diagnosis not present

## 2022-07-28 DIAGNOSIS — M6281 Muscle weakness (generalized): Secondary | ICD-10-CM | POA: Diagnosis not present

## 2022-07-29 DIAGNOSIS — M6281 Muscle weakness (generalized): Secondary | ICD-10-CM | POA: Diagnosis not present

## 2022-07-29 DIAGNOSIS — R29898 Other symptoms and signs involving the musculoskeletal system: Secondary | ICD-10-CM | POA: Diagnosis not present

## 2022-07-29 DIAGNOSIS — M25561 Pain in right knee: Secondary | ICD-10-CM | POA: Diagnosis not present

## 2022-07-29 DIAGNOSIS — R1312 Dysphagia, oropharyngeal phase: Secondary | ICD-10-CM | POA: Diagnosis not present

## 2022-07-29 DIAGNOSIS — R2681 Unsteadiness on feet: Secondary | ICD-10-CM | POA: Diagnosis not present

## 2022-07-30 DIAGNOSIS — R1312 Dysphagia, oropharyngeal phase: Secondary | ICD-10-CM | POA: Diagnosis not present

## 2022-07-30 DIAGNOSIS — R2681 Unsteadiness on feet: Secondary | ICD-10-CM | POA: Diagnosis not present

## 2022-07-30 DIAGNOSIS — M6281 Muscle weakness (generalized): Secondary | ICD-10-CM | POA: Diagnosis not present

## 2022-07-30 DIAGNOSIS — M25561 Pain in right knee: Secondary | ICD-10-CM | POA: Diagnosis not present

## 2022-07-30 DIAGNOSIS — R29898 Other symptoms and signs involving the musculoskeletal system: Secondary | ICD-10-CM | POA: Diagnosis not present

## 2022-08-02 DIAGNOSIS — M6281 Muscle weakness (generalized): Secondary | ICD-10-CM | POA: Diagnosis not present

## 2022-08-02 DIAGNOSIS — R29898 Other symptoms and signs involving the musculoskeletal system: Secondary | ICD-10-CM | POA: Diagnosis not present

## 2022-08-02 DIAGNOSIS — M25561 Pain in right knee: Secondary | ICD-10-CM | POA: Diagnosis not present

## 2022-08-02 DIAGNOSIS — R2681 Unsteadiness on feet: Secondary | ICD-10-CM | POA: Diagnosis not present

## 2022-08-02 DIAGNOSIS — R1312 Dysphagia, oropharyngeal phase: Secondary | ICD-10-CM | POA: Diagnosis not present

## 2022-08-03 DIAGNOSIS — R1312 Dysphagia, oropharyngeal phase: Secondary | ICD-10-CM | POA: Diagnosis not present

## 2022-08-03 DIAGNOSIS — M6281 Muscle weakness (generalized): Secondary | ICD-10-CM | POA: Diagnosis not present

## 2022-08-03 DIAGNOSIS — R2681 Unsteadiness on feet: Secondary | ICD-10-CM | POA: Diagnosis not present

## 2022-08-03 DIAGNOSIS — M25561 Pain in right knee: Secondary | ICD-10-CM | POA: Diagnosis not present

## 2022-08-03 DIAGNOSIS — R29898 Other symptoms and signs involving the musculoskeletal system: Secondary | ICD-10-CM | POA: Diagnosis not present

## 2022-08-04 DIAGNOSIS — M25561 Pain in right knee: Secondary | ICD-10-CM | POA: Diagnosis not present

## 2022-08-04 DIAGNOSIS — R1312 Dysphagia, oropharyngeal phase: Secondary | ICD-10-CM | POA: Diagnosis not present

## 2022-08-04 DIAGNOSIS — R2681 Unsteadiness on feet: Secondary | ICD-10-CM | POA: Diagnosis not present

## 2022-08-04 DIAGNOSIS — R29898 Other symptoms and signs involving the musculoskeletal system: Secondary | ICD-10-CM | POA: Diagnosis not present

## 2022-08-04 DIAGNOSIS — M6281 Muscle weakness (generalized): Secondary | ICD-10-CM | POA: Diagnosis not present

## 2022-08-05 DIAGNOSIS — R2681 Unsteadiness on feet: Secondary | ICD-10-CM | POA: Diagnosis not present

## 2022-08-05 DIAGNOSIS — R1312 Dysphagia, oropharyngeal phase: Secondary | ICD-10-CM | POA: Diagnosis not present

## 2022-08-05 DIAGNOSIS — M25561 Pain in right knee: Secondary | ICD-10-CM | POA: Diagnosis not present

## 2022-08-05 DIAGNOSIS — R29898 Other symptoms and signs involving the musculoskeletal system: Secondary | ICD-10-CM | POA: Diagnosis not present

## 2022-08-05 DIAGNOSIS — M6281 Muscle weakness (generalized): Secondary | ICD-10-CM | POA: Diagnosis not present

## 2022-08-06 DIAGNOSIS — R2681 Unsteadiness on feet: Secondary | ICD-10-CM | POA: Diagnosis not present

## 2022-08-06 DIAGNOSIS — M6281 Muscle weakness (generalized): Secondary | ICD-10-CM | POA: Diagnosis not present

## 2022-08-06 DIAGNOSIS — R29898 Other symptoms and signs involving the musculoskeletal system: Secondary | ICD-10-CM | POA: Diagnosis not present

## 2022-08-06 DIAGNOSIS — R1312 Dysphagia, oropharyngeal phase: Secondary | ICD-10-CM | POA: Diagnosis not present

## 2022-08-06 DIAGNOSIS — M25561 Pain in right knee: Secondary | ICD-10-CM | POA: Diagnosis not present

## 2022-08-09 DIAGNOSIS — M6281 Muscle weakness (generalized): Secondary | ICD-10-CM | POA: Diagnosis not present

## 2022-08-09 DIAGNOSIS — R29898 Other symptoms and signs involving the musculoskeletal system: Secondary | ICD-10-CM | POA: Diagnosis not present

## 2022-08-09 DIAGNOSIS — R1312 Dysphagia, oropharyngeal phase: Secondary | ICD-10-CM | POA: Diagnosis not present

## 2022-08-09 DIAGNOSIS — M25561 Pain in right knee: Secondary | ICD-10-CM | POA: Diagnosis not present

## 2022-08-09 DIAGNOSIS — R2681 Unsteadiness on feet: Secondary | ICD-10-CM | POA: Diagnosis not present

## 2022-08-10 DIAGNOSIS — R2681 Unsteadiness on feet: Secondary | ICD-10-CM | POA: Diagnosis not present

## 2022-08-10 DIAGNOSIS — R29898 Other symptoms and signs involving the musculoskeletal system: Secondary | ICD-10-CM | POA: Diagnosis not present

## 2022-08-10 DIAGNOSIS — M25561 Pain in right knee: Secondary | ICD-10-CM | POA: Diagnosis not present

## 2022-08-10 DIAGNOSIS — R1312 Dysphagia, oropharyngeal phase: Secondary | ICD-10-CM | POA: Diagnosis not present

## 2022-08-10 DIAGNOSIS — M6281 Muscle weakness (generalized): Secondary | ICD-10-CM | POA: Diagnosis not present

## 2022-08-11 ENCOUNTER — Non-Acute Institutional Stay (SKILLED_NURSING_FACILITY): Payer: Medicare Other | Admitting: Nurse Practitioner

## 2022-08-11 ENCOUNTER — Encounter: Payer: Self-pay | Admitting: Nurse Practitioner

## 2022-08-11 DIAGNOSIS — K219 Gastro-esophageal reflux disease without esophagitis: Secondary | ICD-10-CM | POA: Diagnosis not present

## 2022-08-11 DIAGNOSIS — I480 Paroxysmal atrial fibrillation: Secondary | ICD-10-CM

## 2022-08-11 DIAGNOSIS — Z86711 Personal history of pulmonary embolism: Secondary | ICD-10-CM

## 2022-08-11 DIAGNOSIS — R29898 Other symptoms and signs involving the musculoskeletal system: Secondary | ICD-10-CM | POA: Diagnosis not present

## 2022-08-11 DIAGNOSIS — M25561 Pain in right knee: Secondary | ICD-10-CM | POA: Diagnosis not present

## 2022-08-11 DIAGNOSIS — M150 Primary generalized (osteo)arthritis: Secondary | ICD-10-CM

## 2022-08-11 DIAGNOSIS — Z9861 Coronary angioplasty status: Secondary | ICD-10-CM | POA: Diagnosis not present

## 2022-08-11 DIAGNOSIS — I5189 Other ill-defined heart diseases: Secondary | ICD-10-CM | POA: Diagnosis not present

## 2022-08-11 DIAGNOSIS — I2510 Atherosclerotic heart disease of native coronary artery without angina pectoris: Secondary | ICD-10-CM

## 2022-08-11 DIAGNOSIS — R2681 Unsteadiness on feet: Secondary | ICD-10-CM | POA: Diagnosis not present

## 2022-08-11 DIAGNOSIS — M159 Polyosteoarthritis, unspecified: Secondary | ICD-10-CM | POA: Diagnosis not present

## 2022-08-11 DIAGNOSIS — I10 Essential (primary) hypertension: Secondary | ICD-10-CM

## 2022-08-11 DIAGNOSIS — M6281 Muscle weakness (generalized): Secondary | ICD-10-CM | POA: Diagnosis not present

## 2022-08-11 DIAGNOSIS — R1312 Dysphagia, oropharyngeal phase: Secondary | ICD-10-CM | POA: Diagnosis not present

## 2022-08-11 NOTE — Assessment & Plan Note (Signed)
Heart rate is in control, on Eliquis, Metoprolol 

## 2022-08-11 NOTE — Progress Notes (Signed)
Location:   [facility] Room Number: 65A Place of Service:  SNF (31) Provider:  Betsey Sossamon X, NP  Nelwyn Salisbury, MD  Patient Care Team: Nelwyn Salisbury, MD as PCP - General Tresa Endo Clovis Pu, MD as PCP - Cardiology (Cardiology) Ernesto Rutherford, MD as Consulting Physician (Ophthalmology) Luciana Axe Alford Highland, MD as Consulting Physician (Ophthalmology)  Extended Emergency Contact Information Primary Emergency Contact: Elspeth Cho [place] Home Phone: [phone] Mobile Phone: [phone] Relation: Daughter Secondary Emergency Contact: Doree Albee [place] Home Phone: [phone] Relation: Daughter  Code Status:  FULL CODE Goals of care: Advanced Directive information    08/11/2022    9:10 AM  Advanced Directives  Does Patient Have a Medical Advance Directive? Yes  Type of Advance Directive Living will  Does patient want to make changes to medical advance directive? No - Patient declined     Chief Complaint  Patient presents with   Medical Management of Chronic Issues    Routine follow up   Immunizations    COVID booster due    HPI:  Pt is a 89 y.o. male seen today for medical management of chronic diseases.     The left neck pain when head turns to the left, resolved              03/24/23 right elbow fx(distal end of right humerus), mild swelling fingers, pain is well controlled, in hinged elbow ROM brace             CAD ST elevation MI LAD 05/2021, underwent Cath, stenting, s/p percutaneous coronary angioplasty, started DAPT, Echo EF 40-45%,  65-70% 11/02/21, started Plavix 12/24/21 per [facility]             PE 11/02/21, started Eliquis, CTA 05/12/22             Grade 2 diastolic dysfunction, compensated             Afib, on Eliquis, Metoprolol             GERD, thing liquid, hx of dilatation,  takes Pantoprazole, Hgb 12.7 03/30/22             Hypokalemia, on Kcl K 4.0 05/11/22             Hyponatremia, Na 134  05/11/22             HLD on Atorvastatin             HTN, on Metoprolol             Peripheral neuropathy, on Gabapentin   Past Medical History:  Diagnosis Date   Arthritis    Diverticulosis of colon    External thrombosed hemorrhoids 06/11/2009   Qualifier: Diagnosis of  By: Clent Ridges MD, Tera Mater    GERD (gastroesophageal reflux disease)    Hypertension    Past Surgical History:  Procedure Laterality Date   cataract extaction, right  6/09   per Dr. Kennedy Bucker   CATARACT EXTRACTION Left    COLONOSCOPY  09/23/04   per Dr. Sherin Quarry, clear, no repeats needed    CORONARY STENT INTERVENTION N/A 06/06/2021   Procedure: CORONARY STENT INTERVENTION;  Surgeon: Yvonne Kendall, MD;  Location: [facility];  Service: Cardiovascular;  Laterality: N/A;   CORONARY/GRAFT ACUTE MI REVASCULARIZATION N/A 06/06/2021   Procedure: Coronary/Graft Acute MI Revascularization;  Surgeon: Yvonne Kendall, MD;  Location: [facility];  Service: Cardiovascular;  Laterality: N/A;  EGD and esophageal diatation,  05-10-11   per Dr. Christella Hartigan     HERNIA REPAIR     LEFT HEART CATH AND CORONARY ANGIOGRAPHY N/A 06/06/2021   Procedure: LEFT HEART CATH AND CORONARY ANGIOGRAPHY;  Surgeon: Yvonne Kendall, MD;  Location: [facility];  Service: Cardiovascular;  Laterality: N/A;   rt inguinal hernia     x2    Allergies  Allergen Reactions   Contrast Media [Iodinated Contrast Media] Rash    Allergies as of 08/11/2022       Reactions   Contrast Media [iodinated Contrast Media] Rash        Medication List        Accurate as of August 11, 2022 11:59 PM. If you have any questions, ask your nurse or doctor.          STOP taking these medications    traMADol 50 MG tablet Commonly known as: ULTRAM Stopped by: Chanel Mcadams X Shevelle Smither, NP       TAKE these medications    acetaminophen 325 MG tablet Commonly known as: TYLENOL Take 650 mg by mouth every 6 (six) hours as needed.   ascorbic acid 500 MG  tablet Commonly known as: VITAMIN C Take 500 mg by mouth daily. Reported on 10/27/2015   atorvastatin 40 MG tablet Commonly known as: LIPITOR Take 1 tablet (40 mg total) by mouth daily.   clopidogrel 75 MG tablet Commonly known as: PLAVIX Take 75 mg by mouth daily. What changed: Another medication with the same name was removed. Continue taking this medication, and follow the directions you see here. Changed by: Kem Hensen X Cesar Alf, NP   docusate sodium 100 MG capsule Commonly known as: COLACE Take 100 mg by mouth 2 (two) times daily.   Eliquis 5 MG Tabs tablet Generic drug: apixaban Take 1 tablet (5 mg total) by mouth 2 (two) times daily.   gabapentin 300 MG capsule Commonly known as: NEURONTIN TAKE 1 CAPSULE BY MOUTH THREE TIMES A DAY   lidocaine 4 % Place 1 patch onto the skin daily. Remove & Discard patch within 12 hours or as directed by MD   metoprolol tartrate 25 MG tablet Commonly known as: LOPRESSOR Take 1 tablet (25 mg) by mouth in the AM, then take 0.5 tablet (12.5 mg) by mouth in the PM.   nitroGLYCERIN 0.4 MG SL tablet Commonly known as: Nitrostat Place 1 tablet (0.4 mg total) under the tongue every 5 (five) minutes as needed for chest pain.   pantoprazole 40 MG tablet Commonly known as: PROTONIX Take 1 tablet (40 mg total) by mouth daily.   potassium chloride 10 MEQ tablet Commonly known as: Klor-Con 10 Take 1 tablet (10 mEq total) by mouth daily.        Review of Systems  Constitutional:  Negative for appetite change, fatigue and fever.  HENT:  Negative for congestion and trouble swallowing.   Eyes:  Negative for visual disturbance.  Respiratory:  Negative for cough and shortness of breath.   Cardiovascular:  Negative for leg swelling.  Gastrointestinal:  Negative for abdominal pain and constipation.  Genitourinary:  Negative for dysuria, frequency and urgency.       Incontinent of urine  Musculoskeletal:  Positive for arthralgias and gait problem.  Negative for joint swelling, myalgias, neck pain and neck stiffness.  Skin:  Negative for color change.  Neurological:  Negative for speech difficulty, weakness and headaches.  Psychiatric/Behavioral:  Negative for confusion and sleep disturbance. The patient is not nervous/anxious.  Immunization History  Administered Date(s) Administered   Covid-19, Mrna,Vaccine(Spikevax)22yrs and older 03/22/2022   Fluad Quad(high Dose 65+) 02/15/2022   Influenza Split 01/20/2012, 01/07/2013   Influenza Whole 02/09/2006, 01/11/2008   Influenza, High Dose Seasonal PF 12/30/2016, 12/27/2017, 01/08/2019   Influenza,inj,quad, With Preservative 12/29/2018   Influenza-Unspecified 01/17/2014, 01/14/2016, 12/30/2016   PFIZER Comirnaty(Gray Top)Covid-19 Tri-Sucrose Vaccine 05/28/2019, 03/04/2020, 09/23/2020   Pfizer Covid-19 Vaccine Bivalent Booster 47yrs & up 02/25/2022   Pneumococcal Conjugate-13 09/23/2015   Pneumococcal Polysaccharide-23 03/13/2008   Td 12/20/2007   Tdap 07/06/2021   Unspecified SARS-COV-2 Vaccination 04/30/2019   Zoster Recombinat (Shingrix) 03/11/2020, 06/20/2020   Pertinent  Health Maintenance Due  Topic Date Due   HEMOGLOBIN A1C  11/10/2022   INFLUENZA VACCINE  11/25/2022   FOOT EXAM  03/09/2023   OPHTHALMOLOGY EXAM  Discontinued      05/03/2022   12:35 PM 05/04/2022    2:44 PM 06/01/2022   11:04 AM 06/15/2022    9:14 AM 06/23/2022    3:12 PM  Fall Risk  Falls in the past year? 1 1 1 1 1   Was there an injury with Fall? 1 1 1 1 1   Was there an injury with Fall? - Comments     Fx Rt Ekbow. Followed by Orthopedic  Fall Risk Category Calculator 3 3 3 3 2   Fall Risk Category (Retired) [employer]     (RETIRED) Patient Fall Risk Level High fall risk High fall risk     Patient at Risk for Falls Due to History of fall(s);Impaired balance/gait;Impaired mobility History of fall(s);Impaired balance/gait;Impaired mobility;Impaired vision History of fall(s);Impaired balance/gait;Impaired  mobility;Impaired vision History of fall(s);Impaired balance/gait;Impaired mobility Orthopedic patient  Fall risk Follow up Falls evaluation completed Falls evaluation completed Falls evaluation completed Falls evaluation completed Falls prevention discussed   Functional Status Survey:    Vitals:   08/11/22 0856  BP: (!) 144/65  Pulse: 67  Resp: 18  Temp: 97.9 F (36.6 C)  SpO2: 93%  Weight: 154 lb 9.6 oz (70.1 kg)  Height: 5\' 7"  (1.702 m)   Body mass index is 24.21 kg/m. Physical Exam Vitals and nursing note reviewed.  Constitutional:      Appearance: Normal appearance.  HENT:     Head: Normocephalic.     Comments:      Nose: Nose normal.     Mouth/Throat:     Mouth: Mucous membranes are moist.  Eyes:     Extraocular Movements: Extraocular movements intact.     Conjunctiva/sclera: Conjunctivae normal.     Pupils: Pupils are equal, round, and reactive to light.  Cardiovascular:     Rate and Rhythm: Normal rate and regular rhythm.     Heart sounds: No murmur heard. Pulmonary:     Effort: Pulmonary effort is normal.     Breath sounds: Normal breath sounds. No rales.  Abdominal:     General: Bowel sounds are normal.     Palpations: Abdomen is soft.     Tenderness: There is no abdominal tenderness.  Musculoskeletal:        General: Swelling and tenderness present.     Cervical back: Normal range of motion and neck supple.     Comments: Right elbow in a hinged ROM brace,  right fingers mild swelling, able to make a fist, denied pain or numbness.   Skin:    General: Skin is warm and dry.  Neurological:     General: No focal deficit present.     Mental Status: He  is alert and oriented to person, place, and time. Mental status is at baseline.     Gait: Gait abnormal.  Psychiatric:        Mood and Affect: Mood normal.        Behavior: Behavior normal.        Thought Content: Thought content normal.     Labs reviewed: Recent Labs    11/02/21 1640 11/02/21 1648  11/02/21 2130 11/03/21 0537 11/04/21 0548 03/23/22 2255 03/30/22 0000 04/20/22 0000 05/11/22 0800  NA 132*  --  139 133*  132* 134* 132* 131* 133* 134*  K 3.7  --  2.4* 4.1  4.0 3.8 4.1 4.4 4.0 4.0  CL 105  --  120* 104  104 108 101 98* 99 100  CO2 18*  --  12* 21*  21* 19* 23 26* 26* 27*  GLUCOSE 116*  --  97 155*  162* 154* 148*  --   --   --   BUN 18  --  11 16  16 16 16 13 7 10   CREATININE 0.72  --  0.36* 0.51*  0.52* 0.51* 0.77 0.5* 0.5* 0.6  CALCIUM 8.5*  --  5.6* 8.3*  8.3* 8.0* 8.5* 8.5* 9.0 8.8  MG 1.9  --  1.3* 1.9  --   --   --   --   --   PHOS  --  3.3  --  4.4  --   --   --   --   --    Recent Labs    11/02/21 2130 11/03/21 0537 11/04/21 0548  AST 45* 61* 63*  ALT 24 39 37  ALKPHOS 52 83 74  BILITOT 1.3* 1.5* 0.8  PROT 3.7* 5.9* 5.2*  ALBUMIN 1.8* 3.1* 2.7*   Recent Labs    11/02/21 1640 11/03/21 0537 11/03/21 1737 11/04/21 0548 11/05/21 0450 03/23/22 2255 03/30/22 0000  WBC 7.0 3.8*   < > 9.7 9.0 5.0 7.2  NEUTROABS 4.7 2.9  --   --   --  3.3  --   HGB 12.3* 12.7*   < > 11.4* 11.1* 12.8* 12.7*  HCT 35.9* 37.0*   < > 33.3* 32.7* 38.5* 37*  MCV 93.5 92.7   < > 93.0 94.0 94.1  --   PLT 177 163   < > 157 162 216 356   < > = values in this interval not displayed.   Lab Results  Component Value Date   TSH 2.039 11/02/2021   Lab Results  Component Value Date   HGBA1C 5.3 05/12/2022   Lab Results  Component Value Date   CHOL 93 10/14/2021   HDL 45.60 10/14/2021   LDLCALC 33 10/14/2021   LDLDIRECT 72.0 09/27/2017   TRIG 71.0 10/14/2021   CHOLHDL 2 10/14/2021    Significant Diagnostic Results in last 30 days:  No results found.  Assessment/Plan Osteoarthritis The left neck pain when head turns to the left, resolved              03/24/23 right elbow fx(distal end of right humerus), mild swelling fingers, pain is well controlled, in hinged elbow ROM brace  CAD S/P percutaneous coronary angioplasty ST elevation MI LAD 05/2021,  underwent Cath, stenting, s/p percutaneous coronary angioplasty, started DAPT, Echo EF 40-45%,  65-70% 11/02/21, started Plavix 12/24/21 per [facility]  History of pulmonary embolism    PE 11/02/21, started Eliquis, CTA 05/12/22  Diastolic dysfunction compensated  Paroxysmal atrial fibrillation (HCC) Heart rate is in control,  on Eliquis, Metoprolol  GERD thing liquid, hx of dilatation,  takes Pantoprazole, Hgb 12.7 03/30/22  Essential hypertension Loose blood pressure control, on Metoprolol  Neuropathy, peripheral Managed,  on Gabapentin     Family/ staff Communication: plan of care reviewed with the patient and charge nurse.   Labs/tests ordered:  none  Time spend 35 minutes.

## 2022-08-11 NOTE — Assessment & Plan Note (Signed)
compensated 

## 2022-08-11 NOTE — Assessment & Plan Note (Signed)
thing liquid, hx of dilatation,  takes Pantoprazole, Hgb 12.7 03/30/22 

## 2022-08-11 NOTE — Assessment & Plan Note (Signed)
Managed,  on Gabapentin

## 2022-08-11 NOTE — Assessment & Plan Note (Signed)
ST elevation MI LAD 05/2021, underwent Cath, stenting, s/p percutaneous coronary angioplasty, started DAPT, Echo EF 40-45%,  65-70% 11/02/21, started Plavix 12/24/21 per [facility] 

## 2022-08-11 NOTE — Assessment & Plan Note (Signed)
PE 11/02/21, started Eliquis, CTA 05/12/22 

## 2022-08-11 NOTE — Assessment & Plan Note (Signed)
The left neck pain when head turns to the left, resolved              03/24/23 right elbow fx(distal end of right humerus), mild swelling fingers, pain is well controlled, in hinged elbow ROM brace

## 2022-08-11 NOTE — Assessment & Plan Note (Signed)
Loose blood pressure control, on Metoprolol

## 2022-08-12 DIAGNOSIS — R29898 Other symptoms and signs involving the musculoskeletal system: Secondary | ICD-10-CM | POA: Diagnosis not present

## 2022-08-12 DIAGNOSIS — M25561 Pain in right knee: Secondary | ICD-10-CM | POA: Diagnosis not present

## 2022-08-12 DIAGNOSIS — R1312 Dysphagia, oropharyngeal phase: Secondary | ICD-10-CM | POA: Diagnosis not present

## 2022-08-12 DIAGNOSIS — R2681 Unsteadiness on feet: Secondary | ICD-10-CM | POA: Diagnosis not present

## 2022-08-12 DIAGNOSIS — M6281 Muscle weakness (generalized): Secondary | ICD-10-CM | POA: Diagnosis not present

## 2022-08-13 DIAGNOSIS — R29898 Other symptoms and signs involving the musculoskeletal system: Secondary | ICD-10-CM | POA: Diagnosis not present

## 2022-08-13 DIAGNOSIS — M6281 Muscle weakness (generalized): Secondary | ICD-10-CM | POA: Diagnosis not present

## 2022-08-13 DIAGNOSIS — R2681 Unsteadiness on feet: Secondary | ICD-10-CM | POA: Diagnosis not present

## 2022-08-13 DIAGNOSIS — S42411D Displaced simple supracondylar fracture without intercondylar fracture of right humerus, subsequent encounter for fracture with routine healing: Secondary | ICD-10-CM | POA: Diagnosis not present

## 2022-08-13 DIAGNOSIS — R1312 Dysphagia, oropharyngeal phase: Secondary | ICD-10-CM | POA: Diagnosis not present

## 2022-08-13 DIAGNOSIS — M25561 Pain in right knee: Secondary | ICD-10-CM | POA: Diagnosis not present

## 2022-08-16 DIAGNOSIS — M25561 Pain in right knee: Secondary | ICD-10-CM | POA: Diagnosis not present

## 2022-08-16 DIAGNOSIS — R2681 Unsteadiness on feet: Secondary | ICD-10-CM | POA: Diagnosis not present

## 2022-08-16 DIAGNOSIS — R1312 Dysphagia, oropharyngeal phase: Secondary | ICD-10-CM | POA: Diagnosis not present

## 2022-08-16 DIAGNOSIS — R29898 Other symptoms and signs involving the musculoskeletal system: Secondary | ICD-10-CM | POA: Diagnosis not present

## 2022-08-16 DIAGNOSIS — M6281 Muscle weakness (generalized): Secondary | ICD-10-CM | POA: Diagnosis not present

## 2022-08-17 DIAGNOSIS — R2681 Unsteadiness on feet: Secondary | ICD-10-CM | POA: Diagnosis not present

## 2022-08-17 DIAGNOSIS — R1312 Dysphagia, oropharyngeal phase: Secondary | ICD-10-CM | POA: Diagnosis not present

## 2022-08-17 DIAGNOSIS — M6281 Muscle weakness (generalized): Secondary | ICD-10-CM | POA: Diagnosis not present

## 2022-08-17 DIAGNOSIS — R29898 Other symptoms and signs involving the musculoskeletal system: Secondary | ICD-10-CM | POA: Diagnosis not present

## 2022-08-17 DIAGNOSIS — M25561 Pain in right knee: Secondary | ICD-10-CM | POA: Diagnosis not present

## 2022-08-18 DIAGNOSIS — R1312 Dysphagia, oropharyngeal phase: Secondary | ICD-10-CM | POA: Diagnosis not present

## 2022-08-18 DIAGNOSIS — M6281 Muscle weakness (generalized): Secondary | ICD-10-CM | POA: Diagnosis not present

## 2022-08-18 DIAGNOSIS — R2681 Unsteadiness on feet: Secondary | ICD-10-CM | POA: Diagnosis not present

## 2022-08-18 DIAGNOSIS — M25561 Pain in right knee: Secondary | ICD-10-CM | POA: Diagnosis not present

## 2022-08-18 DIAGNOSIS — R29898 Other symptoms and signs involving the musculoskeletal system: Secondary | ICD-10-CM | POA: Diagnosis not present

## 2022-08-19 DIAGNOSIS — M6281 Muscle weakness (generalized): Secondary | ICD-10-CM | POA: Diagnosis not present

## 2022-08-19 DIAGNOSIS — R1312 Dysphagia, oropharyngeal phase: Secondary | ICD-10-CM | POA: Diagnosis not present

## 2022-08-19 DIAGNOSIS — R2681 Unsteadiness on feet: Secondary | ICD-10-CM | POA: Diagnosis not present

## 2022-08-19 DIAGNOSIS — R29898 Other symptoms and signs involving the musculoskeletal system: Secondary | ICD-10-CM | POA: Diagnosis not present

## 2022-08-19 DIAGNOSIS — M25561 Pain in right knee: Secondary | ICD-10-CM | POA: Diagnosis not present

## 2022-08-20 DIAGNOSIS — R29898 Other symptoms and signs involving the musculoskeletal system: Secondary | ICD-10-CM | POA: Diagnosis not present

## 2022-08-20 DIAGNOSIS — M6281 Muscle weakness (generalized): Secondary | ICD-10-CM | POA: Diagnosis not present

## 2022-08-20 DIAGNOSIS — R2681 Unsteadiness on feet: Secondary | ICD-10-CM | POA: Diagnosis not present

## 2022-08-20 DIAGNOSIS — R1312 Dysphagia, oropharyngeal phase: Secondary | ICD-10-CM | POA: Diagnosis not present

## 2022-08-20 DIAGNOSIS — M25561 Pain in right knee: Secondary | ICD-10-CM | POA: Diagnosis not present

## 2022-08-23 ENCOUNTER — Telehealth: Payer: Medicare Other

## 2022-08-23 DIAGNOSIS — M25561 Pain in right knee: Secondary | ICD-10-CM | POA: Diagnosis not present

## 2022-08-23 DIAGNOSIS — R1312 Dysphagia, oropharyngeal phase: Secondary | ICD-10-CM | POA: Diagnosis not present

## 2022-08-23 DIAGNOSIS — M6281 Muscle weakness (generalized): Secondary | ICD-10-CM | POA: Diagnosis not present

## 2022-08-23 DIAGNOSIS — R2681 Unsteadiness on feet: Secondary | ICD-10-CM | POA: Diagnosis not present

## 2022-08-23 DIAGNOSIS — R29898 Other symptoms and signs involving the musculoskeletal system: Secondary | ICD-10-CM | POA: Diagnosis not present

## 2022-08-23 NOTE — Telephone Encounter (Signed)
Patient's daughter called inquiring about form from Viviano Simas. She says it is a Risk analyst for clinical revew" form. They really need to get this back to [employer] Simas ASAP.  Message routed to Dr. Einar Crow

## 2022-08-23 NOTE — Telephone Encounter (Signed)
This is not my Patient. She is in [place] Dr Garen Lah patient

## 2022-08-25 DIAGNOSIS — R29898 Other symptoms and signs involving the musculoskeletal system: Secondary | ICD-10-CM | POA: Diagnosis not present

## 2022-08-25 DIAGNOSIS — M6281 Muscle weakness (generalized): Secondary | ICD-10-CM | POA: Diagnosis not present

## 2022-08-26 DIAGNOSIS — R29898 Other symptoms and signs involving the musculoskeletal system: Secondary | ICD-10-CM | POA: Diagnosis not present

## 2022-08-26 DIAGNOSIS — M6281 Muscle weakness (generalized): Secondary | ICD-10-CM | POA: Diagnosis not present

## 2022-08-27 ENCOUNTER — Ambulatory Visit: Payer: PRIVATE HEALTH INSURANCE | Admitting: Cardiovascular Disease

## 2022-08-30 DIAGNOSIS — R29898 Other symptoms and signs involving the musculoskeletal system: Secondary | ICD-10-CM | POA: Diagnosis not present

## 2022-08-30 DIAGNOSIS — M6281 Muscle weakness (generalized): Secondary | ICD-10-CM | POA: Diagnosis not present

## 2022-09-01 DIAGNOSIS — R29898 Other symptoms and signs involving the musculoskeletal system: Secondary | ICD-10-CM | POA: Diagnosis not present

## 2022-09-01 DIAGNOSIS — M6281 Muscle weakness (generalized): Secondary | ICD-10-CM | POA: Diagnosis not present

## 2022-09-03 DIAGNOSIS — M6281 Muscle weakness (generalized): Secondary | ICD-10-CM | POA: Diagnosis not present

## 2022-09-03 DIAGNOSIS — R29898 Other symptoms and signs involving the musculoskeletal system: Secondary | ICD-10-CM | POA: Diagnosis not present

## 2022-09-06 DIAGNOSIS — R29898 Other symptoms and signs involving the musculoskeletal system: Secondary | ICD-10-CM | POA: Diagnosis not present

## 2022-09-06 DIAGNOSIS — M6281 Muscle weakness (generalized): Secondary | ICD-10-CM | POA: Diagnosis not present

## 2022-09-08 ENCOUNTER — Ambulatory Visit: Payer: Medicare Other | Attending: Cardiovascular Disease | Admitting: Cardiovascular Disease

## 2022-09-08 DIAGNOSIS — I2510 Atherosclerotic heart disease of native coronary artery without angina pectoris: Secondary | ICD-10-CM | POA: Diagnosis not present

## 2022-09-08 DIAGNOSIS — Z86711 Personal history of pulmonary embolism: Secondary | ICD-10-CM | POA: Diagnosis not present

## 2022-09-08 DIAGNOSIS — Z9181 History of falling: Secondary | ICD-10-CM | POA: Diagnosis not present

## 2022-09-08 DIAGNOSIS — I2102 ST elevation (STEMI) myocardial infarction involving left anterior descending coronary artery: Secondary | ICD-10-CM | POA: Insufficient documentation

## 2022-09-08 DIAGNOSIS — E785 Hyperlipidemia, unspecified: Secondary | ICD-10-CM | POA: Diagnosis not present

## 2022-09-08 DIAGNOSIS — Z79899 Other long term (current) drug therapy: Secondary | ICD-10-CM | POA: Diagnosis not present

## 2022-09-08 DIAGNOSIS — Z9861 Coronary angioplasty status: Secondary | ICD-10-CM | POA: Insufficient documentation

## 2022-09-08 DIAGNOSIS — M6281 Muscle weakness (generalized): Secondary | ICD-10-CM | POA: Diagnosis not present

## 2022-09-08 DIAGNOSIS — I480 Paroxysmal atrial fibrillation: Secondary | ICD-10-CM | POA: Insufficient documentation

## 2022-09-08 DIAGNOSIS — R29898 Other symptoms and signs involving the musculoskeletal system: Secondary | ICD-10-CM | POA: Diagnosis not present

## 2022-09-08 MED ORDER — ASPIRIN 81 MG PO TBEC: 81.0000 mg | DELAYED_RELEASE_TABLET | Freq: Every day | ORAL | 3 refills | Status: DC

## 2022-09-08 NOTE — Progress Notes (Signed)
Cardiology Office Note    Date:  09/15/2022   ID:  Gabriel [name], Gabriel [name] [birth year], MRN [id number]  PCP:  Nelwyn Salisbury, MD  Cardiologist:  Nicki Guadalajara, MD   5 month F/U office visit   History of Present Illness:  Gabriel [name] is a 89 y.o. male who I saw on December 23, 2021 for an evaluation following his February 2023 hospitalization.  He presents for 72-month follow-up evaluation.  Gabriel [name] has a history of hypertension, GERD, who was hospitalized in February 2023 in the setting of ST segment elevation myocardial infarction.  He was found to have thrombotic 90% mid LAD stenosis with 99% subtotal occlusion of the mid distal LAD with otherwise mild nonobstructive CAD in the circumflex and with ectasia without significant stenosis in the RCA.  He underwent emergent catheterization by Dr. And and underwent successful DES stenting with nonoverlapping 3.0 x 12 mm and 2.0 x 15 mm Medtronic Onyx stents.  He was started on DAPT PT with aspirin and Brilinta.  An echo Doppler study showed EF 40 to 45% with apical anterior wall hypokinesis.  An initial ECG raises concern for possible A-fib but repeat ECG shows sinus bradycardia with occasional junctional beats.  DOAC was not started and he ultimately stabilized and was discharged on aspirin/Brilinta.  [facility] living.  Following his hospitalization, he apparently had fallen out of bed on several occasions and ultimately was transition to assisted living.  He has seen Bernadene Person, NP on 2 occasions following his hospitalization and last saw her on Sep 02, 2021.  At that time he was stable I continue to be on aspirin/Brilinta, metoprolol tartrate 25 mg twice a day, atorvastatin 40 mg daily, in addition to gabapentin and pantoprazole.  Apparently, he was hospitalized on November 02, 2021 after development of COVID with COVID-pneumonia.  CT angio of his chest and was done which revealed lobar and  segmental pulmonary emboli in branches of the right upper lobe with small overall clot burden. There was CT evidence for right heart strain.  There was mild subpleural patchy opacities in the lungs bilaterally, lower lobe predominant felt related to his COVID-pneumonia.  During that hospitalization, he was started on Eliquis and advised to stop taking aspirin but to continue ticagrelor.  An echo Doppler study on November 02, 2021 showed hyperdynamic LV function with EF at 65 to 70% without wall motion abnormalities.  There was grade 2 diastolic dysfunction.  He had mildly elevated PA systolic pressure 39.6 mm.  Left atrium was mildly dilated.  There was mild mitral regurgitation.  There was mild aortic sclerosis.  When I saw him on December 23, 2021 he was here with his daughter He has been living [facility].  He walks with a walker and also rides a scooter.  He has been participating in physical therapy 3 days/week.  His primary provider is Dr. Gershon Crane.  He denied any chest tightness or awareness of palpitations, presyncope or syncope.  During that evaluation, I recommended that he discontinue ticagrelor since he was on Eliquis due to potential increased risk for bleed and recommended changing him to clopidogrel.  I suggested that perhaps after 6 months of his PE if he remains stable anticoagulation can be discontinued and he can be maintained on aspirin/Plavix.  I last saw him on April 14, 2022.  Since his previous evaluation he had  fallen and broke his elbow of his right arm on November 28.  He was in a cast.  Apparently, this happened late at the night when he was coming back to his room and was told by the staff that he needed to take a shower and when he went off the carpet to the hardwood floors his walker slipped and he fell.  During my evaluation, he denied any chest pain or shortness of breath, presyncope or syncope.  During my evaluation, I recommended that in January 2024  since it will be 6 months following his pulmonary embolism he should undergo a follow-up CT to make certain that this was stable without residual thrombus.  If no thrombus was present my recommendation was for him to discontinue Eliquis and resume aspirin 81 mg with clopidogrel in light of his LAD stent.  He underwent CT angio of his chest on May 12, 2022 which did not show any evidence for pulmonary embolism.  He had a large hiatal hernia, and there was dependent atelectasis in the posterior lungs bilaterally.  There is also evidence for cholelithiasis and diverticulosis at the splenic flexure of the colon.  He had scattered atherosclerotic calcification including the coronary arteries and aortic atherosclerosis.  Presently, [facility] continues to be living at [facility].  He feels well.  Apparently, he continues to be on Eliquis which was not stopped by Dr. Hyacinth Meeker.  He presents for evaluation.   Past Medical History:  Diagnosis Date   Arthritis    Diverticulosis of colon    External thrombosed hemorrhoids 06/11/2009   Qualifier: Diagnosis of  By: Clent Ridges MD, Tera Mater    GERD (gastroesophageal reflux disease)    Hypertension     Past Surgical History:  Procedure Laterality Date   cataract extaction, right  6/09   per Dr. Kennedy Bucker   CATARACT EXTRACTION Left    COLONOSCOPY  09/23/04   per Dr. Sherin Quarry, clear, no repeats needed    CORONARY STENT INTERVENTION N/A 06/06/2021   Procedure: CORONARY STENT INTERVENTION;  Surgeon: Yvonne Kendall, MD;  Location: [facility];  Service: Cardiovascular;  Laterality: N/A;   CORONARY/GRAFT ACUTE MI REVASCULARIZATION N/A 06/06/2021   Procedure: Coronary/Graft Acute MI Revascularization;  Surgeon: Yvonne Kendall, MD;  Location: [facility];  Service: Cardiovascular;  Laterality: N/A;   EGD and esophageal diatation,  05-10-11   per Dr. Christella Hartigan     HERNIA REPAIR     LEFT HEART CATH AND CORONARY ANGIOGRAPHY N/A 06/06/2021   Procedure: LEFT HEART CATH  AND CORONARY ANGIOGRAPHY;  Surgeon: Yvonne Kendall, MD;  Location: [facility];  Service: Cardiovascular;  Laterality: N/A;   rt inguinal hernia     x2    Current Medications: Outpatient Medications Prior to Visit  Medication Sig Dispense Refill   acetaminophen (TYLENOL) 325 MG tablet Take 650 mg by mouth every 6 (six) hours as needed.     atorvastatin (LIPITOR) 40 MG tablet Take 1 tablet (40 mg total) by mouth daily. 90 tablet 3   clopidogrel (PLAVIX) 75 MG tablet Take 75 mg by mouth daily.     docusate sodium (COLACE) 100 MG capsule Take 100 mg by mouth 2 (two) times daily.     gabapentin (NEURONTIN) 300 MG capsule TAKE 1 CAPSULE BY MOUTH THREE TIMES A DAY 270 capsule 1   metoprolol tartrate (LOPRESSOR) 25 MG tablet Take 1 tablet (25 mg) by mouth in the AM, then take 0.5 tablet (12.5 mg) by mouth in the  PM. 180 tablet 3   pantoprazole (PROTONIX) 40 MG tablet Take 1 tablet (40 mg total) by mouth daily. 30 tablet 0   potassium chloride (KLOR-CON 10) 10 MEQ tablet Take 1 tablet (10 mEq total) by mouth daily. 30 tablet 0   TROLAMINE SALICYLATE EX Apply 1 Application topically as needed for muscle pain. 4%     vitamin C (ASCORBIC ACID) 500 MG tablet Take 500 mg by mouth daily. Reported on 10/27/2015     apixaban (ELIQUIS) 5 MG TABS tablet Take 1 tablet (5 mg total) by mouth 2 (two) times daily. 60 tablet 0   lidocaine 4 % Place 1 patch onto the skin daily. Remove & Discard patch within 12 hours or as directed by MD (Patient not taking: Reported on 09/08/2022)     nitroGLYCERIN (NITROSTAT) 0.4 MG SL tablet Place 1 tablet (0.4 mg total) under the tongue every 5 (five) minutes as needed for chest pain. 25 tablet 3   No facility-administered medications prior to visit.     Allergies:   Contrast media [iodinated contrast media]   Social History   Socioeconomic History   Marital status: Widowed    Spouse name: Not on file   Number of children: 3   Years of education: Not on file    Highest education level: Not on file  Occupational History   Occupation: retired  Tobacco Use   Smoking status: Former    Packs/day: 15.00    Years: 0.50    Additional pack years: 0.00    Total pack years: 7.50    Types: Cigarettes   Smokeless tobacco: Never   Tobacco comments:    smoked 18 and stopped and states he stopped 30's   Vaping Use   Vaping Use: Never used  Substance and Sexual Activity   Alcohol use: No    Alcohol/week: 0.0 standard drinks of alcohol   Drug use: No   Sexual activity: Never    Birth control/protection: Abstinence  Other Topics Concern   Not on file  Social History Narrative   Lives in studio at [facility]   Widowed since 2009, was married x 57 years.    3 daughters.    Social Determinants of Health   Financial Resource Strain: Low Risk  (06/23/2022)   Overall Financial Resource Strain (CARDIA)    Difficulty of Paying Living Expenses: Not hard at all  Food Insecurity: No Food Insecurity (06/23/2022)   Hunger Vital Sign    Worried About Running Out of Food in the Last Year: Never true    Ran Out of Food in the Last Year: Never true  Transportation Needs: No Transportation Needs (06/23/2022)   PRAPARE - Administrator, Civil Service (Medical): No    Lack of Transportation (Non-Medical): No  Physical Activity: Sufficiently Active (06/23/2022)   Exercise Vital Sign    Days of Exercise per Week: 4 days    Minutes of Exercise per Session: 60 min  Stress: No Stress Concern Present (06/23/2022)   [employer] of Occupational Health - Occupational Stress Questionnaire    Feeling of Stress : Not at all  Social Connections: Moderately Integrated (06/23/2022)   Social Connection and Isolation Panel [NHANES]    Frequency of Communication with Friends and Family: More than three times a week    Frequency of Social Gatherings with Friends and Family: More than three times a week    Attends Religious Services: More than 4 times per year  Active Member of Clubs or Organizations: Yes    Attends Banker Meetings: More than 4 times per year    Marital Status: Widowed     Family History:  The patient's family history includes Coronary artery disease in his father.  His parents are deceased.  ROS General: Negative; No fevers, chills, or night sweats;  HEENT: Negative; No changes in vision or hearing, sinus congestion, difficulty swallowing Pulmonary: COVID-pneumonia with lobar and segmental pulmonary emboli in branches of the right upper lobe with small clot burden July 2023 Cardiovascular: See HPI GI: Negative; No nausea, vomiting, diarrhea, or abdominal pain GU: Negative; No dysuria, hematuria, or difficulty voiding Musculoskeletal: Right arm in cast Hematologic/Oncology: Negative; no easy bruising, bleeding Endocrine: Negative; no heat/cold intolerance; no diabetes Neuro: Negative; no changes in balance, headaches Skin: Negative; No rashes or skin lesions Psychiatric: Negative; No behavioral problems, depression Sleep: Negative; No snoring, daytime sleepiness, hypersomnolence, bruxism, restless legs, hypnogognic hallucinations, no cataplexy Other comprehensive 14 point system review is negative.   PHYSICAL EXAM:   VS:  BP 100/60   Pulse 66   Ht 5\' 7"  (1.702 m)   Wt 156 lb 9.6 oz (71 kg)   SpO2 96%   BMI 24.53 kg/m     Repeat blood pressure by me was 114/60  Wt Readings from Last 3 Encounters:  09/10/22 158 lb (71.7 kg)  09/08/22 156 lb 9.6 oz (71 kg)  08/11/22 154 lb 9.6 oz (70.1 kg)    General: Alert, oriented, no distress.  Skin: normal turgor, no rashes, warm and dry HEENT: Normocephalic, atraumatic. Pupils equal round and reactive to light; sclera anicteric; extraocular muscles intact;  Nose without nasal septal hypertrophy Mouth/Parynx benign; Mallinpatti scale 3 Neck: No JVD, no carotid bruits; normal carotid upstroke Lungs: clear to ausculatation and percussion; no wheezing or  rales Chest wall: without tenderness to palpitation Heart: PMI not displaced, RRR, s1 s2 normal, 1/6 systolic murmur, no diastolic murmur, no rubs, gallops, thrills, or heaves Abdomen: soft, nontender; no hepatosplenomehaly, BS+; abdominal aorta nontender and not dilated by palpation. Back: no CVA tenderness Pulses 2+ Musculoskeletal: full range of motion, normal strength, no joint deformities Extremities: no clubbing cyanosis or edema, Homan's sign negative  Neurologic: grossly nonfocal; Cranial nerves grossly wnl Psychologic: Normal mood and affect    Studies/Labs Reviewed:   May 15, 2024ECG (independently read by me): Sinus rhythm at 66, 1st degree AV block, isolate PVC   April 14, 2022 ECG (independently read by me): Junctional rhythm vs sinus with low voltage p wave at 65, PVCs; QS V1-2  December 23, 2021 ECG (independently read by me): Probable sinus rhythm at 60  QS V1-2  Recent Labs:    Latest Ref Rng & Units 05/11/2022    8:00 AM 04/20/2022   12:00 AM 03/30/2022   12:00 AM  BMP  BUN 4 - 21 10     7     13       Creatinine 0.6 - 1.3 0.6     0.5     0.5      Sodium 137 - 147 134     133     131      Potassium 3.5 - 5.1 mEq/L 4.0     4.0     4.4      Chloride 99 - 108 100     99     98      CO2 13 - 22 27     26  26      Calcium 8.7 - 10.7 8.8     9.0     8.5         This result is from an external source.        Latest Ref Rng & Units 11/04/2021    5:48 AM 11/03/2021    5:37 AM 11/02/2021    9:30 PM  Hepatic Function  Total Protein 6.5 - 8.1 g/dL 5.2  5.9  3.7   Albumin 3.5 - 5.0 g/dL 2.7  3.1  1.8   AST 15 - 41 U/L 63  61  45   ALT 0 - 44 U/L 37  39  24   Alk Phosphatase 38 - 126 U/L 74  83  52   Total Bilirubin 0.3 - 1.2 mg/dL 0.8  1.5  1.3        Latest Ref Rng & Units 03/30/2022   12:00 AM 03/23/2022   10:55 PM 11/05/2021    4:50 AM  CBC  WBC  7.2     5.0  9.0   Hemoglobin 13.5 - 17.5 12.7     12.8  11.1   Hematocrit 41 - 53 37     38.5  32.7    Platelets 150 - 400 K/uL 356     216  162      This result is from an external source.   Lab Results  Component Value Date   MCV 94.1 03/23/2022   MCV 94.0 11/05/2021   MCV 93.0 11/04/2021   Lab Results  Component Value Date   TSH 2.039 11/02/2021   Lab Results  Component Value Date   HGBA1C 5.3 05/12/2022     BNP    Component Value Date/Time   BNP 368.7 (H) 11/03/2021 0537    ProBNP No results found for: "PROBNP"   Lipid Panel     Component Value Date/Time   CHOL 93 10/14/2021 1141   TRIG 71.0 10/14/2021 1141   HDL 45.60 10/14/2021 1141   CHOLHDL 2 10/14/2021 1141   VLDL 14.2 10/14/2021 1141   LDLCALC 33 10/14/2021 1141   LDLDIRECT 72.0 09/27/2017 0939     RADIOLOGY: No results found.   Additional studies/ records that were reviewed today include:   CATH/PCI: 06/06/2021 Conclusions: Severe single-vessel coronary artery disease with thrombotic 90% mid LAD stenosis and 99% subtotal occlusion of the distal LAD with TIMI-1 flow.  Mild, nonobstructive coronary artery disease noted in the LCx.  RCA is ectatic without significant stenosis. Normal left ventricular filling pressure (LVEDP ~7 mmHg). Successful PCI to mid and distal LAD using nonoverlapping Onyx Frontier 3.0 x 12 mm (mid LAD) and 2.0 x 15 mm (distal LAD) drug-eluting stents with 0% residual stenosis and TIMI-2 flow into the distal vessel.  Poor reflow most likely related to microvascular dysfunction from late presentation.   Recommendations: Admit to 2H-ICU for post STEMI monitoring/care. Complete 2 hours of cangrelor infusion. Dual antiplatelet therapy with aspirin and ticagrelor for at least 12 months.  If atrial fibrillation persists during the hospitalization, we may need to consider transitioning to clopidogrel plus NOAC prior to discharge. Gentle post-catheterization hydration, given normal LVEDP but likely reduced LVEF. Obtain echocardiogram. Aggressive secondary prevention of coronary  artery disease.    Intervention    ASSESSMENT:    1. ST elevation myocardial infarction involving left anterior descending (LAD) coronary artery [facility]): June 06, 2021   2. CAD S/P percutaneous coronary angioplasty   3. Paroxysmal atrial fibrillation (HCC)  4. History of pulmonary embolus (PE): July 2023   5. Medication management   6. Hyperlipidemia LDL goal <70   7. Risk for falls     PLAN:  Gabriel [name] is a very pleasant 89 year old gentleman who has a history of hypertension, hyperlipidemia, presented to [facility] on [facility], 2021 with a STEMI involving his LAD.  He underwent successful emergent cardiac catheterization and stenting of his proximal to mid and mid distal LAD with DES stents.  He was treated with DAPT with aspirin/Brilinta as well as lipid-lowering therapy and beta-blocker therapy.  Apparently, upon returning to [facility] independent living, he had fallen out his bed on several occasions and as result was transition to assisted living.  When  seen by Bernadene Person, NP in May 2023, he was maintaining sinus rhythm.  He  developed COVID-pneumonia in July 2023 and was admitted to the hospital on November 02, 2021.  An echo Doppler study revealed normalization of LV function with EF at 60 to 65% with grade 2 diastolic dysfunction, mildly dilated left atrium, mild MR, and mild aortic sclerosis.  A chest CT demonstrated findings consistent with pneumonia and he was started on steroids M&M severe placed and placed on oxygen.  A chest CT however demonstrated lobar and segmental pulmonary emboli in branches of the right upper lobe with small overall clot burden.  There was a suggestion of possible right heart strain.  Apparently during his hospitalization he was started on Eliquis for anticoagulation, aspirin was discontinued, and he was told to maintain ticagrelor.  At my initial office visit with him, due to bleed risk I recommended discontinuance of ticagrelor and switched him to  clopidogrel to take with Plavix and stay off aspirin.  At my evaluation in December 2023 I recommended a 48-month follow-up CT scan, and if that was negative for PE particularly with his Brilinta and risk that Eliquis be discontinued and he will be switched back to aspirin/clopidogrel in light of his stent.  Apparently, his Eliquis was never discontinued by Dr. Hyacinth Meeker and his note in March 2024.  Gabriel [name] is now 10 months following his PE.  There is no recurrent episodes.  With his prior fall risk, I do think it is prudent to discontinue Eliquis and that he continue being on Plavix with the reinitiation of aspirin 81 mg.  His blood pressure today is stable on metoprolol tartrate.  He is on atorvastatin 40 mg for hyperlipidemia.  He takes pantoprazole for GERD.  I will see him in 6 to 8 months for reevaluation or sooner as needed.  Medication Adjustments/Labs and Tests Ordered: Current medicines are reviewed at length with the patient today.  Concerns regarding medicines are outlined above.  Medication changes, Labs and Tests ordered today are listed in the Patient Instructions below. Patient Instructions  Medication Instructions:  STOP TAKING ELIQUIS START ASPIRIN 81 MG A DAY CONTINUE TAKING PLAVIX 75 MG A DAY *If you need a refill on your cardiac medications before your next appointment, please call your pharmacy*  Follow-Up: At [facility], you and your health needs are our priority.  As part of our continuing mission to provide you with exceptional heart care, we have created designated Provider Care Teams.  These Care Teams include your primary Cardiologist (physician) and Advanced Practice Providers (APPs -  Physician Assistants and Nurse Practitioners) who all work together to provide you with the care you need, when you need it.  We recommend signing up for the patient portal  called "MyChart".  Sign up information is provided on this After Visit Summary.  MyChart is used to connect  with patients for Virtual Visits (Telemedicine).  Patients are able to view lab/test results, encounter notes, upcoming appointments, etc.  Non-urgent messages can be sent to your provider as well.   To learn more about what you can do with MyChart, go to [email or web address].    Your next appointment:    6-8 MONTHS  Provider:   Nicki Guadalajara, MD       Signed, Nicki Guadalajara, MD  09/15/2022 7:35 PM    [facility] [address], [place], [place]  [address] Phone: [phone]

## 2022-09-08 NOTE — Patient Instructions (Signed)
Medication Instructions:  STOP TAKING ELIQUIS START ASPIRIN 81 MG A DAY CONTINUE TAKING PLAVIX 75 MG A DAY *If you need a refill on your cardiac medications before your next appointment, please call your pharmacy*  Follow-Up: At [facility], you and your health needs are our priority.  As part of our continuing mission to provide you with exceptional heart care, we have created designated Provider Care Teams.  These Care Teams include your primary Cardiologist (physician) and Advanced Practice Providers (APPs -  Physician Assistants and Nurse Practitioners) who all work together to provide you with the care you need, when you need it.  We recommend signing up for the patient portal called "MyChart".  Sign up information is provided on this After Visit Summary.  MyChart is used to connect with patients for Virtual Visits (Telemedicine).  Patients are able to view lab/test results, encounter notes, upcoming appointments, etc.  Non-urgent messages can be sent to your provider as well.   To learn more about what you can do with MyChart, go to [email or web address].    Your next appointment:    6-8 MONTHS  Provider:   Nicki Guadalajara, MD

## 2022-09-10 ENCOUNTER — Non-Acute Institutional Stay (SKILLED_NURSING_FACILITY): Payer: Medicare Other | Admitting: Nurse Practitioner

## 2022-09-10 ENCOUNTER — Encounter: Payer: Self-pay | Admitting: Nurse Practitioner

## 2022-09-10 DIAGNOSIS — G609 Hereditary and idiopathic neuropathy, unspecified: Secondary | ICD-10-CM | POA: Diagnosis not present

## 2022-09-10 DIAGNOSIS — I480 Paroxysmal atrial fibrillation: Secondary | ICD-10-CM | POA: Diagnosis not present

## 2022-09-10 DIAGNOSIS — E785 Hyperlipidemia, unspecified: Secondary | ICD-10-CM | POA: Diagnosis not present

## 2022-09-10 DIAGNOSIS — E871 Hypo-osmolality and hyponatremia: Secondary | ICD-10-CM

## 2022-09-10 DIAGNOSIS — I5189 Other ill-defined heart diseases: Secondary | ICD-10-CM | POA: Diagnosis not present

## 2022-09-10 DIAGNOSIS — M6281 Muscle weakness (generalized): Secondary | ICD-10-CM | POA: Diagnosis not present

## 2022-09-10 DIAGNOSIS — K219 Gastro-esophageal reflux disease without esophagitis: Secondary | ICD-10-CM | POA: Diagnosis not present

## 2022-09-10 DIAGNOSIS — I10 Essential (primary) hypertension: Secondary | ICD-10-CM | POA: Diagnosis not present

## 2022-09-10 DIAGNOSIS — Z9861 Coronary angioplasty status: Secondary | ICD-10-CM

## 2022-09-10 DIAGNOSIS — Z86711 Personal history of pulmonary embolism: Secondary | ICD-10-CM | POA: Diagnosis not present

## 2022-09-10 DIAGNOSIS — I2510 Atherosclerotic heart disease of native coronary artery without angina pectoris: Secondary | ICD-10-CM

## 2022-09-10 DIAGNOSIS — R29898 Other symptoms and signs involving the musculoskeletal system: Secondary | ICD-10-CM | POA: Diagnosis not present

## 2022-09-10 NOTE — Progress Notes (Unsigned)
Location:  [facility] Room Number: 65A Place of Service:  SNF (31) Provider: Roshanda Balazs X, NP   Nelwyn Salisbury, MD  Patient Care Team: Nelwyn Salisbury, MD as PCP - General Tresa Endo Clovis Pu, MD as PCP - Cardiology (Cardiology) Ernesto Rutherford, MD as Consulting Physician (Ophthalmology) Luciana Axe Alford Highland, MD as Consulting Physician (Ophthalmology)  Extended Emergency Contact Information Primary Emergency Contact: Elspeth Cho [place] Home Phone: [phone] Mobile Phone: [phone] Relation: Daughter Secondary Emergency Contact: Doree Albee [place] Home Phone: [phone] Relation: Daughter  Code Status:  Full Code  Goals of care: Advanced Directive information    09/10/2022    8:42 AM  Advanced Directives  Does Patient Have a Medical Advance Directive? Yes  Type of Advance Directive Living will  Does patient want to make changes to medical advance directive? No - Patient declined     Chief Complaint  Patient presents with  . Medical Management of Chronic Issues    Patient is being seen for a routine visit.    HPI:  Pt is a 89 y.o. [name] seen today for medical management of chronic diseases.    03/24/23 right elbow fx(distal end of right humerus), mild swelling fingers, pain is well controlled, in hinged elbow ROM brace             CAD ST elevation MI LAD 05/2021, underwent Cath, stenting, s/p percutaneous coronary angioplasty, started DAPT, Echo EF 40-45%,  65-70% 11/02/21, started Plavix 12/24/21 per [facility]             PE 11/02/21, had CTA 05/12/22, 09/08/22 Cardiology dc Eliquis, start ASA 81mg  qd, continue Plavix 75mg  qd. Repeated CT @ 6 mos,  negative PE             Grade 2 diastolic dysfunction, compensated             Afib, off Eliquis 08/09/22 per cardiology, on  Metoprolol             GERD, thing liquid, hx of dilatation,  takes Pantoprazole, Hgb 12.7 03/30/22             Hypokalemia, on Kcl K 4.0  05/11/22             Hyponatremia, Na 134 05/11/22             HLD on Atorvastatin             HTN, on Metoprolol             Peripheral neuropathy, on Gabapentin   Past Medical History:  Diagnosis Date  . Arthritis   . Diverticulosis of colon   . External thrombosed hemorrhoids 06/11/2009   Qualifier: Diagnosis of  By: Clent Ridges MD, Tera Mater   . GERD (gastroesophageal reflux disease)   . Hypertension    Past Surgical History:  Procedure Laterality Date  . cataract extaction, right  6/09   per Dr. Kennedy Bucker  . CATARACT EXTRACTION Left   . COLONOSCOPY  09/23/04   per Dr. Sherin Quarry, clear, no repeats needed   . CORONARY STENT INTERVENTION N/A 06/06/2021   Procedure: CORONARY STENT INTERVENTION;  Surgeon: Yvonne Kendall, MD;  Location: [facility] INVASIVE CV LAB;  Service: Cardiovascular;  Laterality: N/A;  . CORONARY/GRAFT ACUTE MI REVASCULARIZATION N/A 06/06/2021   Procedure: Coronary/Graft Acute MI Revascularization;  Surgeon: Yvonne Kendall, MD;  Location: [facility] INVASIVE CV [facility];  Service: Cardiovascular;  Laterality: N/A;  . EGD and esophageal diatation,  05-10-11   per Dr. Christella Hartigan    . HERNIA REPAIR    . LEFT HEART CATH AND CORONARY ANGIOGRAPHY N/A 06/06/2021   Procedure: LEFT HEART CATH AND CORONARY ANGIOGRAPHY;  Surgeon: Yvonne Kendall, MD;  Location: [facility];  Service: Cardiovascular;  Laterality: N/A;  . rt inguinal hernia     x2    Allergies  Allergen Reactions  . Contrast Media [Iodinated Contrast Media] Rash    Outpatient Encounter Medications as of 09/10/2022  Medication Sig  . acetaminophen (TYLENOL) 325 MG tablet Take 650 mg by mouth every 6 (six) hours as needed.  Marland Kitchen aspirin EC 81 MG tablet Take 1 tablet (81 mg total) by mouth daily. Swallow whole.  Marland Kitchen atorvastatin (LIPITOR) 40 MG tablet Take 1 tablet (40 mg total) by mouth daily.  . clopidogrel (PLAVIX) 75 MG tablet Take 75 mg by mouth daily.  Marland Kitchen docusate sodium (COLACE) 100 MG capsule Take 100 mg by mouth 2 (two) times  daily.  Marland Kitchen gabapentin (NEURONTIN) 300 MG capsule TAKE 1 CAPSULE BY MOUTH THREE TIMES A DAY  . metoprolol tartrate (LOPRESSOR) 25 MG tablet Take 1 tablet (25 mg) by mouth in the AM, then take 0.5 tablet (12.5 mg) by mouth in the PM.  . pantoprazole (PROTONIX) 40 MG tablet Take 1 tablet (40 mg total) by mouth daily.  . potassium chloride (KLOR-CON 10) 10 MEQ tablet Take 1 tablet (10 mEq total) by mouth daily.  . TROLAMINE SALICYLATE EX Apply 1 Application topically as needed for muscle pain. 4%  . vitamin C (ASCORBIC ACID) 500 MG tablet Take 500 mg by mouth daily. Reported on 10/27/2015  . lidocaine 4 % Place 1 patch onto the skin daily. Remove & Discard patch within 12 hours or as directed by MD (Patient not taking: Reported on 09/08/2022)  . nitroGLYCERIN (NITROSTAT) 0.4 MG SL tablet Place 1 tablet (0.4 mg total) under the tongue every 5 (five) minutes as needed for chest pain.   No facility-administered encounter medications on file as of 09/10/2022.    Review of Systems  Immunization History  Administered Date(s) Administered  . Covid-19, Mrna,Vaccine(Spikevax)8yrs and older 03/22/2022  . Fluad Quad(high Dose 65+) 02/15/2022  . Influenza Split 01/20/2012, 01/07/2013  . Influenza Whole 02/09/2006, 01/11/2008  . Influenza, High Dose Seasonal PF 12/30/2016, 12/27/2017, 01/08/2019  . Influenza,inj,quad, With Preservative 12/29/2018  . Influenza-Unspecified 01/17/2014, 01/14/2016, 12/30/2016  . PFIZER Comirnaty(Gray Top)Covid-19 Tri-Sucrose Vaccine 05/28/2019, 03/04/2020, 09/23/2020  . Research officer, trade union 63yrs & up 02/25/2022  . Pneumococcal Conjugate-13 09/23/2015  . Pneumococcal Polysaccharide-23 03/13/2008  . Td 12/20/2007  . Tdap 07/06/2021  . Unspecified SARS-COV-2 Vaccination 04/30/2019  . Zoster Recombinat (Shingrix) 03/11/2020, 06/20/2020   Pertinent  Health Maintenance Due  Topic Date Due  . HEMOGLOBIN A1C  11/10/2022  . INFLUENZA VACCINE  11/25/2022  .  FOOT EXAM  03/09/2023  . OPHTHALMOLOGY EXAM  Discontinued      05/03/2022   12:35 PM 05/04/2022    2:44 PM 06/01/2022   11:04 AM 06/15/2022    9:14 AM 06/23/2022    3:12 PM  Fall Risk  Falls in the past year? 1 1 1 1 1   Was there an injury with Fall? 1 1 1 1 1   Was there an injury with Fall? - Comments     Fx Rt Ekbow. Followed by Orthopedic  Fall Risk Category Calculator 3 3 3 3 2   Fall Risk Category (Retired) [employer]     (RETIRED) Patient Fall  Risk Level High fall risk High fall risk     Patient at Risk for Falls Due to History of fall(s);Impaired balance/gait;Impaired mobility History of fall(s);Impaired balance/gait;Impaired mobility;Impaired vision History of fall(s);Impaired balance/gait;Impaired mobility;Impaired vision History of fall(s);Impaired balance/gait;Impaired mobility Orthopedic patient  Fall risk Follow up Falls evaluation completed Falls evaluation completed Falls evaluation completed Falls evaluation completed Falls prevention discussed   Functional Status Survey:    Vitals:   09/10/22 0837  BP: (!) 111/55  Pulse: 67  Resp: 16  Temp: 97.8 F (36.6 C)  TempSrc: Temporal  SpO2: 98%  Weight: 158 lb (Gabriel.7 kg)  Height: 5\' 7"  (1.702 m)   Body mass index is 24.75 kg/m. Physical Exam  Labs reviewed: Recent Labs    11/02/21 1640 11/02/21 1648 11/02/21 2130 11/03/21 0537 11/04/21 0548 03/23/22 2255 03/30/22 0000 04/20/22 0000 05/11/22 0800  NA 132*  --  139 133*  132* 134* 132* 131* 133* 134*  K 3.7  --  2.4* 4.1  4.0 3.8 4.1 4.4 4.0 4.0  CL 105  --  120* 104  104 108 101 98* 99 100  CO2 18*  --  12* 21*  21* 19* 23 26* 26* 27*  GLUCOSE 116*  --  97 155*  162* 154* 148*  --   --   --   BUN 18  --  11 16  16 16 16 13 7 10   CREATININE 0.72  --  0.36* 0.51*  0.52* 0.51* 0.77 0.5* 0.5* 0.6  CALCIUM 8.5*  --  5.6* 8.3*  8.3* 8.0* 8.5* 8.5* 9.0 8.8  MG 1.9  --  1.3* 1.9  --   --   --   --   --   PHOS  --  3.3  --  4.4  --   --   --   --   --     Recent Labs    11/02/21 2130 11/03/21 0537 11/04/21 0548  AST 45* 61* 63*  ALT 24 39 37  ALKPHOS 52 83 74  BILITOT 1.3* 1.5* 0.8  PROT 3.7* 5.9* 5.2*  ALBUMIN 1.8* 3.1* 2.7*   Recent Labs    11/02/21 1640 11/03/21 0537 11/03/21 1737 11/04/21 0548 11/05/21 0450 03/23/22 2255 03/30/22 0000  WBC 7.0 3.8*   < > 9.7 9.0 5.0 7.2  NEUTROABS 4.7 2.9  --   --   --  3.3  --   HGB 12.3* 12.7*   < > 11.4* 11.1* 12.8* 12.7*  HCT 35.9* 37.0*   < > 33.3* 32.7* 38.5* 37*  MCV 93.5 92.7   < > 93.0 94.0 94.1  --   PLT 177 163   < > 157 162 216 356   < > = values in this interval not displayed.   Lab Results  Component Value Date   TSH 2.039 11/02/2021   Lab Results  Component Value Date   HGBA1C 5.3 05/12/2022   Lab Results  Component Value Date   CHOL 93 10/14/2021   HDL 45.60 10/14/2021   LDLCALC 33 10/14/2021   LDLDIRECT 72.0 09/27/2017   TRIG Gabriel.0 10/14/2021   CHOLHDL 2 10/14/2021    Significant Diagnostic Results in last 30 days:  No results found.  Assessment/Plan Paroxysmal atrial fibrillation (HCC) off Eliquis 08/09/22 per cardiology, decrease Metoprolol to 12.5mg  qd 2/2 to low Bp, monitor HR     Family/ staff Communication: plan of care reviewed with the patient and charge nurse  Labs/tests ordered:  none  Time spend  35 minutes

## 2022-09-10 NOTE — Assessment & Plan Note (Addendum)
off Eliquis 08/09/22 per cardiology, decrease Metoprolol to 12.5mg  qd 2/2 to low Bp, monitor HR

## 2022-09-13 DIAGNOSIS — M6281 Muscle weakness (generalized): Secondary | ICD-10-CM | POA: Diagnosis not present

## 2022-09-13 DIAGNOSIS — R29898 Other symptoms and signs involving the musculoskeletal system: Secondary | ICD-10-CM | POA: Diagnosis not present

## 2022-09-13 NOTE — Assessment & Plan Note (Signed)
compensated 

## 2022-09-13 NOTE — Assessment & Plan Note (Signed)
on Gabapentin ?

## 2022-09-13 NOTE — Assessment & Plan Note (Signed)
thing liquid, hx of dilatation,  takes Pantoprazole, Hgb 12.7 12/5/2

## 2022-09-13 NOTE — Assessment & Plan Note (Signed)
Low Bp noted, asymptomatic, decrease Metoprolol to 12.5mg  qd, observe Bp and HR.

## 2022-09-13 NOTE — Assessment & Plan Note (Signed)
ST elevation MI LAD 05/2021, underwent Cath, stenting, s/p percutaneous coronary angioplasty, started DAPT, Echo EF 40-45%,  65-70% 11/02/21, started Plavix 12/24/21 per [facility] 

## 2022-09-13 NOTE — Assessment & Plan Note (Signed)
on Gabapentin, uses powder chair to get aroun.

## 2022-09-13 NOTE — Assessment & Plan Note (Signed)
03/24/23 right elbow fx(distal end of right humerus), mild swelling fingers, pain is well controlled, in hinged elbow ROM brace 

## 2022-09-13 NOTE — Assessment & Plan Note (Signed)
PE 11/02/21, had CTA 05/12/22, 09/08/22 Cardiology dc Eliquis, start ASA 81mg  qd, continue Plavix 75mg  qd. Repeated CT @ 6 mos,  negative PE

## 2022-09-13 NOTE — Assessment & Plan Note (Signed)
Na 134 05/11/22 

## 2022-09-15 ENCOUNTER — Encounter: Payer: Self-pay | Admitting: Cardiovascular Disease

## 2022-09-15 DIAGNOSIS — R29898 Other symptoms and signs involving the musculoskeletal system: Secondary | ICD-10-CM | POA: Diagnosis not present

## 2022-09-15 DIAGNOSIS — M6281 Muscle weakness (generalized): Secondary | ICD-10-CM | POA: Diagnosis not present

## 2022-09-16 ENCOUNTER — Encounter: Payer: Self-pay | Admitting: Nurse Practitioner

## 2022-09-17 DIAGNOSIS — R29898 Other symptoms and signs involving the musculoskeletal system: Secondary | ICD-10-CM | POA: Diagnosis not present

## 2022-09-17 DIAGNOSIS — M6281 Muscle weakness (generalized): Secondary | ICD-10-CM | POA: Diagnosis not present

## 2022-09-28 ENCOUNTER — Non-Acute Institutional Stay (SKILLED_NURSING_FACILITY): Payer: Medicare Other | Admitting: Nurse Practitioner

## 2022-09-28 ENCOUNTER — Encounter: Payer: Self-pay | Admitting: Nurse Practitioner

## 2022-09-28 DIAGNOSIS — K219 Gastro-esophageal reflux disease without esophagitis: Secondary | ICD-10-CM

## 2022-09-28 DIAGNOSIS — Z9861 Coronary angioplasty status: Secondary | ICD-10-CM | POA: Diagnosis not present

## 2022-09-28 DIAGNOSIS — I2510 Atherosclerotic heart disease of native coronary artery without angina pectoris: Secondary | ICD-10-CM

## 2022-09-28 DIAGNOSIS — I10 Essential (primary) hypertension: Secondary | ICD-10-CM | POA: Diagnosis not present

## 2022-09-28 DIAGNOSIS — I480 Paroxysmal atrial fibrillation: Secondary | ICD-10-CM | POA: Diagnosis not present

## 2022-09-28 DIAGNOSIS — G609 Hereditary and idiopathic neuropathy, unspecified: Secondary | ICD-10-CM

## 2022-09-28 DIAGNOSIS — I5189 Other ill-defined heart diseases: Secondary | ICD-10-CM

## 2022-09-28 DIAGNOSIS — Z86711 Personal history of pulmonary embolism: Secondary | ICD-10-CM | POA: Diagnosis not present

## 2022-09-28 NOTE — Assessment & Plan Note (Signed)
off Eliquis 08/09/22 per cardiology, on  Metoprolol, heart rate is controlled.

## 2022-09-28 NOTE — Assessment & Plan Note (Signed)
ST elevation MI LAD 05/2021, underwent Cath, stenting, s/p percutaneous coronary angioplasty, started DAPT, Echo EF 40-45%,  65-70% 11/02/21, started Plavix 12/24/21 per [facility] 

## 2022-09-28 NOTE — Assessment & Plan Note (Signed)
11/02/21, had CTA 05/12/22, 09/08/22 Cardiology dc Eliquis, start ASA 81mg  qd, continue Plavix 75mg  qd. Repeated CT @ 6 mos,  negative PE

## 2022-09-28 NOTE — Assessment & Plan Note (Signed)
Blood pressure is controlled, on Metoprolol 

## 2022-09-28 NOTE — Assessment & Plan Note (Signed)
W/c for mobility since R elbow fx,  on Gabapentin

## 2022-09-28 NOTE — Progress Notes (Signed)
Location:  [facility] Room Number: NO/65/A Place of Service:  SNF (31) Provider:  Eliezer Khawaja X, NP  Nelwyn Salisbury, MD  Patient Care Team: Nelwyn Salisbury, MD as PCP - General Tresa Endo Clovis Pu, MD as PCP - Cardiology (Cardiology) Ernesto Rutherford, MD as Consulting Physician (Ophthalmology) Luciana Axe Alford Highland, MD as Consulting Physician (Ophthalmology)  Extended Emergency Contact Information Primary Emergency Contact: Elspeth Cho [place] Home Phone: [phone] Mobile Phone: [phone] Relation: Daughter Secondary Emergency Contact: Doree Albee [place] Home Phone: [phone] Relation: Daughter  Code Status:  FULL Goals of care: Advanced Directive information    09/10/2022    8:42 AM  Advanced Directives  Does Patient Have a Medical Advance Directive? Yes  Type of Advance Directive Living will  Does patient want to make changes to medical advance directive? No - Patient declined     Chief Complaint  Patient presents with  . Acute Visit    Elevated BP need 2nd reading for care gap metric   . Rash    HPI:  Pt is a 89 y.o. male seen today for an acute visit for reported left buttock open area and rash, slightly redness noted, but no rash, denied itching or pain.    03/24/23 right elbow fx(distal end of right humerus), mild swelling fingers, pain is well controlled, in hinged elbow ROM brace             CAD ST elevation MI LAD 05/2021, underwent Cath, stenting, s/p percutaneous coronary angioplasty, started DAPT, Echo EF 40-45%,  65-70% 11/02/21, started Plavix 12/24/21 per [facility]             PE 11/02/21, had CTA 05/12/22, 09/08/22 Cardiology dc Eliquis, start ASA 81mg  qd, continue Plavix 75mg  qd. Repeated CT @ 6 mos,  negative PE             Grade 2 diastolic dysfunction, compensated             Afib, off Eliquis 08/09/22 per cardiology, on  Metoprolol             GERD, thing liquid, hx of dilatation,  takes  Pantoprazole, Hgb 12.7 03/30/22             Hypokalemia, on Kcl K 4.0 05/11/22             Hyponatremia, Na 134 05/11/22             HLD on Atorvastatin             HTN, on Metoprolol             Peripheral neuropathy, on Gabapentin  Past Medical History:  Diagnosis Date  . Arthritis   . Diverticulosis of colon   . External thrombosed hemorrhoids 06/11/2009   Qualifier: Diagnosis of  By: Clent Ridges MD, Tera Mater   . GERD (gastroesophageal reflux disease)   . Hypertension    Past Surgical History:  Procedure Laterality Date  . cataract extaction, right  6/09   per Dr. Kennedy Bucker  . CATARACT EXTRACTION Left   . COLONOSCOPY  09/23/04   per Dr. Sherin Quarry, clear, no repeats needed   . CORONARY STENT INTERVENTION N/A 06/06/2021   Procedure: CORONARY STENT INTERVENTION;  Surgeon: Yvonne Kendall, MD;  Location: [facility];  Service: Cardiovascular;  Laterality: N/A;  . CORONARY/GRAFT ACUTE MI REVASCULARIZATION N/A 06/06/2021   Procedure: Coronary/Graft Acute MI Revascularization;  Surgeon: Yvonne Kendall, MD;  Location: [facility]  INVASIVE CV LAB;  Service: Cardiovascular;  Laterality: N/A;  . EGD and esophageal diatation,  05-10-11   per Dr. Christella Hartigan    . HERNIA REPAIR    . LEFT HEART CATH AND CORONARY ANGIOGRAPHY N/A 06/06/2021   Procedure: LEFT HEART CATH AND CORONARY ANGIOGRAPHY;  Surgeon: Yvonne Kendall, MD;  Location: [facility];  Service: Cardiovascular;  Laterality: N/A;  . rt inguinal hernia     x2    Allergies  Allergen Reactions  . Contrast Media [Iodinated Contrast Media] Rash    Outpatient Encounter Medications as of 09/28/2022  Medication Sig  . acetaminophen (TYLENOL) 325 MG tablet Take 650 mg by mouth every 6 (six) hours as needed.  Marland Kitchen aspirin EC 81 MG tablet Take 1 tablet (81 mg total) by mouth daily. Swallow whole.  Marland Kitchen atorvastatin (LIPITOR) 40 MG tablet Take 1 tablet (40 mg total) by mouth daily.  . clopidogrel (PLAVIX) 75 MG tablet Take 75 mg by mouth daily.  Marland Kitchen docusate  sodium (COLACE) 100 MG capsule Take 100 mg by mouth 2 (two) times daily.  Marland Kitchen gabapentin (NEURONTIN) 300 MG capsule TAKE 1 CAPSULE BY MOUTH THREE TIMES A DAY  . metoprolol tartrate (LOPRESSOR) 25 MG tablet Take 1 tablet (25 mg) by mouth in the AM, then take 0.5 tablet (12.5 mg) by mouth in the PM.  . pantoprazole (PROTONIX) 40 MG tablet Take 1 tablet (40 mg total) by mouth daily.  . potassium chloride (KLOR-CON 10) 10 MEQ tablet Take 1 tablet (10 mEq total) by mouth daily.  . TROLAMINE SALICYLATE EX Apply 1 Application topically as needed for muscle pain. 4%  . vitamin C (ASCORBIC ACID) 500 MG tablet Take 500 mg by mouth daily. Reported on 10/27/2015  . nitroGLYCERIN (NITROSTAT) 0.4 MG SL tablet Place 1 tablet (0.4 mg total) under the tongue every 5 (five) minutes as needed for chest pain.  . [DISCONTINUED] lidocaine 4 % Place 1 patch onto the skin daily. Remove & Discard patch within 12 hours or as directed by MD (Patient not taking: Reported on 09/08/2022)   No facility-administered encounter medications on file as of 09/28/2022.    Review of Systems  Constitutional:  Negative for appetite change, fatigue and fever.  HENT:  Negative for congestion and trouble swallowing.   Eyes:  Negative for visual disturbance.  Respiratory:  Negative for cough and shortness of breath.   Cardiovascular:  Negative for leg swelling.  Gastrointestinal:  Negative for abdominal pain and constipation.  Genitourinary:  Negative for dysuria, frequency and urgency.       Incontinent of urine  Musculoskeletal:  Positive for arthralgias and gait problem. Negative for joint swelling.  Skin:  Negative for color change, rash and wound.  Neurological:  Negative for speech difficulty, weakness and headaches.  Psychiatric/Behavioral:  Negative for confusion and sleep disturbance. The patient is not nervous/anxious.     Immunization History  Administered Date(s) Administered  . Covid-19, Mrna,Vaccine(Spikevax)31yrs and older  03/22/2022  . Fluad Quad(high Dose 65+) 02/15/2022  . Influenza Split 01/20/2012, 01/07/2013  . Influenza Whole 02/09/2006, 01/11/2008  . Influenza, High Dose Seasonal PF 12/30/2016, 12/27/2017, 01/08/2019  . Influenza,inj,quad, With Preservative 12/29/2018  . Influenza-Unspecified 01/17/2014, 01/14/2016, 12/30/2016  . PFIZER Comirnaty(Gray Top)Covid-19 Tri-Sucrose Vaccine 05/28/2019, 03/04/2020, 09/23/2020  . Research officer, trade union 45yrs & up 02/25/2022  . Pneumococcal Conjugate-13 09/23/2015  . Pneumococcal Polysaccharide-23 03/13/2008  . Td 12/20/2007  . Tdap 07/06/2021  . Unspecified SARS-COV-2 Vaccination 04/30/2019  . Zoster Recombinat (Shingrix)  03/11/2020, 06/20/2020   Pertinent  Health Maintenance Due  Topic Date Due  . HEMOGLOBIN A1C  11/10/2022  . INFLUENZA VACCINE  11/25/2022  . FOOT EXAM  03/09/2023  . OPHTHALMOLOGY EXAM  Discontinued      05/03/2022   12:35 PM 05/04/2022    2:44 PM 06/01/2022   11:04 AM 06/15/2022    9:14 AM 06/23/2022    3:12 PM  Fall Risk  Falls in the past year? 1 1 1 1 1   Was there an injury with Fall? 1 1 1 1 1   Was there an injury with Fall? - Comments     Fx Rt Ekbow. Followed by Orthopedic  Fall Risk Category Calculator 3 3 3 3 2   Fall Risk Category (Retired) [employer]     (RETIRED) Patient Fall Risk Level High fall risk High fall risk     Patient at Risk for Falls Due to History of fall(s);Impaired balance/gait;Impaired mobility History of fall(s);Impaired balance/gait;Impaired mobility;Impaired vision History of fall(s);Impaired balance/gait;Impaired mobility;Impaired vision History of fall(s);Impaired balance/gait;Impaired mobility Orthopedic patient  Fall risk Follow up Falls evaluation completed Falls evaluation completed Falls evaluation completed Falls evaluation completed Falls prevention discussed   Functional Status Survey:    Vitals:   09/28/22 1054  BP: (!) 146/86  Pulse: 70  Resp: 20  SpO2: 98%  Weight: 155  lb 9.6 oz (70.6 kg)  Height: 5\' 7"  (1.702 m)   Body mass index is 24.37 kg/m. Physical Exam Vitals and nursing note reviewed.  Constitutional:      Appearance: Normal appearance.  HENT:     Head: Normocephalic.     Comments:      Nose: Nose normal.     Mouth/Throat:     Mouth: Mucous membranes are moist.  Eyes:     Extraocular Movements: Extraocular movements intact.     Conjunctiva/sclera: Conjunctivae normal.     Pupils: Pupils are equal, round, and reactive to light.  Cardiovascular:     Rate and Rhythm: Normal rate and regular rhythm.     Heart sounds: No murmur heard. Pulmonary:     Effort: Pulmonary effort is normal.     Breath sounds: Normal breath sounds. No rales.  Abdominal:     General: Bowel sounds are normal.     Palpations: Abdomen is soft.     Tenderness: There is no abdominal tenderness.  Musculoskeletal:        General: Swelling and tenderness present.     Cervical back: Normal range of motion and neck supple.     Comments: Right elbow in a hinged ROM brace,  right fingers mild swelling, able to make a fist, denied pain or numbness.   Skin:    General: Skin is warm and dry.     Findings: No bruising, erythema or rash.  Neurological:     General: No focal deficit present.     Mental Status: He is alert and oriented to person, place, and time. Mental status is at baseline.     Gait: Gait abnormal.  Psychiatric:        Mood and Affect: Mood normal.        Behavior: Behavior normal.        Thought Content: Thought content normal.    Labs reviewed: Recent Labs    11/02/21 1640 11/02/21 1648 11/02/21 2130 11/03/21 0537 11/04/21 0548 03/23/22 2255 03/30/22 0000 04/20/22 0000 05/11/22 0800  NA 132*  --  139 133*  132* 134* 132* 131* 133* 134*  K 3.7  --  2.4* 4.1  4.0 3.8 4.1 4.4 4.0 4.0  CL 105  --  120* 104  104 108 101 98* 99 100  CO2 18*  --  12* 21*  21* 19* 23 26* 26* 27*  GLUCOSE 116*  --  97 155*  162* 154* 148*  --   --   --   BUN  18  --  11 16  16 16 16 13 7 10   CREATININE 0.72  --  0.36* 0.51*  0.52* 0.51* 0.77 0.5* 0.5* 0.6  CALCIUM 8.5*  --  5.6* 8.3*  8.3* 8.0* 8.5* 8.5* 9.0 8.8  MG 1.9  --  1.3* 1.9  --   --   --   --   --   PHOS  --  3.3  --  4.4  --   --   --   --   --    Recent Labs    11/02/21 2130 11/03/21 0537 11/04/21 0548  AST 45* 61* 63*  ALT 24 39 37  ALKPHOS 52 83 74  BILITOT 1.3* 1.5* 0.8  PROT 3.7* 5.9* 5.2*  ALBUMIN 1.8* 3.1* 2.7*   Recent Labs    11/02/21 1640 11/03/21 0537 11/03/21 1737 11/04/21 0548 11/05/21 0450 03/23/22 2255 03/30/22 0000  WBC 7.0 3.8*   < > 9.7 9.0 5.0 7.2  NEUTROABS 4.7 2.9  --   --   --  3.3  --   HGB 12.3* 12.7*   < > 11.4* 11.1* 12.8* 12.7*  HCT 35.9* 37.0*   < > 33.3* 32.7* 38.5* 37*  MCV 93.5 92.7   < > 93.0 94.0 94.1  --   PLT 177 163   < > 157 162 216 356   < > = values in this interval not displayed.   Lab Results  Component Value Date   TSH 2.039 11/02/2021   Lab Results  Component Value Date   HGBA1C 5.3 05/12/2022   Lab Results  Component Value Date   CHOL 93 10/14/2021   HDL 45.60 10/14/2021   LDLCALC 33 10/14/2021   LDLDIRECT 72.0 09/27/2017   TRIG 71.0 10/14/2021   CHOLHDL 2 10/14/2021    Significant Diagnostic Results in last 30 days:  No results found.  Assessment/Plan CAD S/P percutaneous coronary angioplasty  ST elevation MI LAD 05/2021, underwent Cath, stenting, s/p percutaneous coronary angioplasty, started DAPT, Echo EF 40-45%,  65-70% 11/02/21, started Plavix 12/24/21 per [facility]  History of pulmonary embolism  11/02/21, had CTA 05/12/22, 09/08/22 Cardiology dc Eliquis, start ASA 81mg  qd, continue Plavix 75mg  qd. Repeated CT @ 6 mos,  negative PE  Diastolic dysfunction compensated  Paroxysmal atrial fibrillation (HCC)  off Eliquis 08/09/22 per cardiology, on  Metoprolol, heart rate is controlled.   GERD  thing liquid, hx of dilatation,  takes Pantoprazole, Hgb 12.7 03/30/22, stable.   Essential  hypertension Blood pressure is controlled,  on Metoprolol  Neuropathy, peripheral W/c for mobility since R elbow fx,  on Gabapentin     Family/ staff Communication: plan of care reviewed with the patient and charge nurse.   Labs/tests ordered:  none  Time spend 35 minutes.

## 2022-09-28 NOTE — Assessment & Plan Note (Signed)
thing liquid, hx of dilatation,  takes Pantoprazole, Hgb 12.7 03/30/22, stable.

## 2022-09-28 NOTE — Assessment & Plan Note (Signed)
compensated 

## 2022-09-30 ENCOUNTER — Encounter: Payer: Self-pay | Admitting: Nurse Practitioner

## 2022-09-30 ENCOUNTER — Telehealth: Payer: Self-pay | Admitting: Family Medicine

## 2022-09-30 DIAGNOSIS — I480 Paroxysmal atrial fibrillation: Secondary | ICD-10-CM

## 2022-09-30 DIAGNOSIS — I10 Essential (primary) hypertension: Secondary | ICD-10-CM

## 2022-09-30 NOTE — Telephone Encounter (Signed)
-----   Message from Sherrill Raring, [facility] sent at 09/28/2022 12:02 PM EDT ----- Regarding: 2300 Pharmacy Referral Hello,  Patient is currently flagged as high risk for hospitalization and is eligible to meet with me through the Care Coordination program.  Could a 2300-Pharmacy referral for medication management be placed for the patient so that I can get them scheduled?  Thank you! Sherrill Raring Clinical Pharmacist [phone]

## 2022-09-30 NOTE — Telephone Encounter (Signed)
Done

## 2022-10-05 ENCOUNTER — Non-Acute Institutional Stay (SKILLED_NURSING_FACILITY): Payer: Medicare Other | Admitting: Family Medicine

## 2022-10-05 DIAGNOSIS — G609 Hereditary and idiopathic neuropathy, unspecified: Secondary | ICD-10-CM | POA: Diagnosis not present

## 2022-10-05 DIAGNOSIS — Z9861 Coronary angioplasty status: Secondary | ICD-10-CM | POA: Diagnosis not present

## 2022-10-05 DIAGNOSIS — I2510 Atherosclerotic heart disease of native coronary artery without angina pectoris: Secondary | ICD-10-CM

## 2022-10-05 DIAGNOSIS — E785 Hyperlipidemia, unspecified: Secondary | ICD-10-CM | POA: Diagnosis not present

## 2022-10-05 DIAGNOSIS — R296 Repeated falls: Secondary | ICD-10-CM | POA: Diagnosis not present

## 2022-10-05 DIAGNOSIS — S42401A Unspecified fracture of lower end of right humerus, initial encounter for closed fracture: Secondary | ICD-10-CM

## 2022-10-05 NOTE — Progress Notes (Addendum)
Provider:  Jacalyn Lefevre, MD Location:      Place of Service:     PCP: Nelwyn Salisbury, MD Patient Care Team: Nelwyn Salisbury, MD as PCP - General Lennette Bihari, MD as PCP - Cardiology (Cardiology) Ernesto Rutherford, MD as Consulting Physician (Ophthalmology) Luciana Axe Alford Highland, MD as Consulting Physician (Ophthalmology)  Extended Emergency Contact Information Primary Emergency Contact: Elspeth Cho [place] Home Phone: [phone] Mobile Phone: [phone] Relation: Daughter Secondary Emergency Contact: Doree Albee [place] Home Phone: [phone] Relation: Daughter  Code Status:  Goals of Care: Advanced Directive information    09/10/2022    8:42 AM  Advanced Directives  Does Patient Have a Medical Advance Directive? Yes  Type of Advance Directive Living will  Does patient want to make changes to medical advance directive? No - Patient declined      No chief complaint on file.   HPI: Patient is a 89 y.o. male seen today for medical management of chronic problems including A-fib, osteoarthritis, hypertension, humeral fracture. Patient had a fall 7 months ago suffering a fracture of the distal humerus.  Conservative therapy, without surgery was decided and he has been followed by orthopedics and physical therapy since that time.  Therapy has been discontinued.  He is wearing a hinged splint on the right elbow and traded out for a fixed fiberglass splint at bedtime.  He is resigned to wear the splint and definitely.  Fortunately he is left-handed so this has not caused him excessive problem.  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  He requires help to stand and for toileting. He denies any new pains or concerns.  Plan had been for him to move back to assisted living but he is now accepting of the fact that he will stay here his skilled care.  He has lots of friends to call on him from prior residence  here.  Past Medical History:  Diagnosis Date   Arthritis    Diverticulosis of colon    External thrombosed hemorrhoids 06/11/2009   Qualifier: Diagnosis of  By: Clent Ridges MD, Tera Mater    GERD (gastroesophageal reflux disease)    Hypertension    Past Surgical History:  Procedure Laterality Date   cataract extaction, right  6/09   per Dr. Kennedy Bucker   CATARACT EXTRACTION Left    COLONOSCOPY  09/23/04   per Dr. Sherin Quarry, clear, no repeats needed    CORONARY STENT INTERVENTION N/A 06/06/2021   Procedure: CORONARY STENT INTERVENTION;  Surgeon: Yvonne Kendall, MD;  Location: [facility];  Service: Cardiovascular;  Laterality: N/A;   CORONARY/GRAFT ACUTE MI REVASCULARIZATION N/A 06/06/2021   Procedure: Coronary/Graft Acute MI Revascularization;  Surgeon: Yvonne Kendall, MD;  Location: [facility];  Service: Cardiovascular;  Laterality: N/A;   EGD and esophageal diatation,  05-10-11   per Dr. Christella Hartigan     HERNIA REPAIR     LEFT HEART CATH AND CORONARY ANGIOGRAPHY N/A 06/06/2021   Procedure: LEFT HEART CATH AND CORONARY ANGIOGRAPHY;  Surgeon: Yvonne Kendall, MD;  Location: [facility];  Service: Cardiovascular;  Laterality: N/A;   rt inguinal hernia     x2    reports that he has quit smoking. His smoking use included cigarettes. He has a 7.50 pack-year smoking history. He has never used smokeless tobacco. He reports that he does not drink alcohol and does not use drugs. Social History   Socioeconomic History   Marital  status: Widowed    Spouse name: Not on file   Number of children: 3   Years of education: Not on file   Highest education level: Not on file  Occupational History   Occupation: retired  Tobacco Use   Smoking status: Former    Packs/day: 15.00    Years: 0.50    Additional pack years: 0.00    Total pack years: 7.50    Types: Cigarettes   Smokeless tobacco: Never   Tobacco comments:    smoked 18 and stopped and states he stopped 30's   Vaping Use    Vaping Use: Never used  Substance and Sexual Activity   Alcohol use: No    Alcohol/week: 0.0 standard drinks of alcohol   Drug use: No   Sexual activity: Never    Birth control/protection: Abstinence  Other Topics Concern   Not on file  Social History Narrative   Lives in studio [facility]   Widowed since 2009, was married x 57 years.    3 daughters.    Social Determinants of Health   Financial Resource Strain: Low Risk  (06/23/2022)   Overall Financial Resource Strain (CARDIA)    Difficulty of Paying Living Expenses: Not hard at all  Food Insecurity: No Food Insecurity (06/23/2022)   Hunger Vital Sign    Worried About Running Out of Food in the Last Year: Never true    Ran Out of Food in the Last Year: Never true  Transportation Needs: No Transportation Needs (06/23/2022)   PRAPARE - Administrator, Civil Service (Medical): No    Lack of Transportation (Non-Medical): No  Physical Activity: Sufficiently Active (06/23/2022)   Exercise Vital Sign    Days of Exercise per Week: 4 days    Minutes of Exercise per Session: 60 min  Stress: No Stress Concern Present (06/23/2022)   [employer] of Occupational Health - Occupational Stress Questionnaire    Feeling of Stress : Not at all  Social Connections: Moderately Integrated (06/23/2022)   Social Connection and Isolation Panel [NHANES]    Frequency of Communication with Friends and Family: More than three times a week    Frequency of Social Gatherings with Friends and Family: More than three times a week    Attends Religious Services: More than 4 times per year    Active Member of [employer] or Organizations: Yes    Attends Banker Meetings: More than 4 times per year    Marital Status: Widowed  Intimate Partner Violence: Not At Risk (06/23/2022)   Humiliation, Afraid, Rape, and Kick questionnaire    Fear of Current or Ex-Partner: No    Emotionally Abused: No    Physically Abused: No    Sexually Abused:  No    Functional Status Survey:    Family History  Problem Relation Age of Onset   Coronary artery disease Father    Colon cancer Neg Hx    Stomach cancer Neg Hx    Esophageal cancer Neg Hx    Pancreatic cancer Neg Hx     Health Maintenance  Topic Date Due   COVID-19 Vaccine (6 - 2023-24 season) 05/17/2022   HEMOGLOBIN A1C  11/10/2022   INFLUENZA VACCINE  11/25/2022   FOOT EXAM  03/09/2023   Medicare Annual Wellness (AWV)  06/24/2023   DTaP/Tdap/Td (3 - Td or Tdap) 07/07/2031   Pneumonia Vaccine 27+ Years old  Completed   Zoster Vaccines- Shingrix  Completed   HPV VACCINES  Aged Out   OPHTHALMOLOGY EXAM  Discontinued    Allergies  Allergen Reactions   Contrast Media [Iodinated Contrast Media] Rash    Outpatient Encounter Medications as of 10/05/2022  Medication Sig   acetaminophen (TYLENOL) 325 MG tablet Take 650 mg by mouth every 6 (six) hours as needed.   aspirin EC 81 MG tablet Take 1 tablet (81 mg total) by mouth daily. Swallow whole.   atorvastatin (LIPITOR) 40 MG tablet Take 1 tablet (40 mg total) by mouth daily.   clopidogrel (PLAVIX) 75 MG tablet Take 75 mg by mouth daily.   docusate sodium (COLACE) 100 MG capsule Take 100 mg by mouth 2 (two) times daily.   gabapentin (NEURONTIN) 300 MG capsule TAKE 1 CAPSULE BY MOUTH THREE TIMES A DAY   metoprolol tartrate (LOPRESSOR) 25 MG tablet Take 1 tablet (25 mg) by mouth in the AM, then take 0.5 tablet (12.5 mg) by mouth in the PM.   nitroGLYCERIN (NITROSTAT) 0.4 MG SL tablet Place 1 tablet (0.4 mg total) under the tongue every 5 (five) minutes as needed for chest pain.   pantoprazole (PROTONIX) 40 MG tablet Take 1 tablet (40 mg total) by mouth daily.   potassium chloride (KLOR-CON 10) 10 MEQ tablet Take 1 tablet (10 mEq total) by mouth daily.   TROLAMINE SALICYLATE EX Apply 1 Application topically as needed for muscle pain. 4%   vitamin C (ASCORBIC ACID) 500 MG tablet Take 500 mg by mouth daily. Reported on 10/27/2015    No facility-administered encounter medications on file as of 10/05/2022.    Review of Systems  Constitutional: Negative.   HENT: Negative.    Respiratory: Negative.    Cardiovascular: Negative.   Gastrointestinal: Negative.   Musculoskeletal:  Positive for arthralgias and gait problem.  Psychiatric/Behavioral: Negative.    All other systems reviewed and are negative.   There were no vitals filed for this visit. There is no height or weight on file to calculate BMI. Physical Exam Nursing note reviewed.  Constitutional:      Appearance: Normal appearance.  Cardiovascular:     Rate and Rhythm: Normal rate and regular rhythm.  Pulmonary:     Effort: Pulmonary effort is normal.     Breath sounds: Normal breath sounds.  Abdominal:     General: Bowel sounds are normal.     Palpations: Abdomen is soft.  Musculoskeletal:     Comments: Wears a right hinged splint brace Moves around the facility in an electric wheelchair  Neurological:     General: No focal deficit present.     Mental Status: He is alert and oriented to person, place, and time.  Psychiatric:        Mood and Affect: Mood normal.        Behavior: Behavior normal.     Labs reviewed: Basic Metabolic Panel: Recent Labs    11/02/21 1640 11/02/21 1648 11/02/21 2130 11/03/21 0537 11/04/21 0548 03/23/22 2255 03/30/22 0000 04/20/22 0000 05/11/22 0800  NA 132*  --  139 133*  132* 134* 132* 131* 133* 134*  K 3.7  --  2.4* 4.1  4.0 3.8 4.1 4.4 4.0 4.0  CL 105  --  120* 104  104 108 101 98* 99 100  CO2 18*  --  12* 21*  21* 19* 23 26* 26* 27*  GLUCOSE 116*  --  97 155*  162* 154* 148*  --   --   --   BUN 18  --  11 16  16 16 16 13 7 10   CREATININE 0.72  --  0.36* 0.51*  0.52* 0.51* 0.77 0.5* 0.5* 0.6  CALCIUM 8.5*  --  5.6* 8.3*  8.3* 8.0* 8.5* 8.5* 9.0 8.8  MG 1.9  --  1.3* 1.9  --   --   --   --   --   PHOS  --  3.3  --  4.4  --   --   --   --   --    Liver Function Tests: Recent Labs     11/02/21 2130 11/03/21 0537 11/04/21 0548  AST 45* 61* 63*  ALT 24 39 37  ALKPHOS 52 83 74  BILITOT 1.3* 1.5* 0.8  PROT 3.7* 5.9* 5.2*  ALBUMIN 1.8* 3.1* 2.7*   No results for input(s): "LIPASE", "AMYLASE" in the last 8760 hours. No results for input(s): "AMMONIA" in the last 8760 hours. CBC: Recent Labs    11/02/21 1640 11/03/21 0537 11/03/21 1737 11/04/21 0548 11/05/21 0450 03/23/22 2255 03/30/22 0000  WBC 7.0 3.8*   < > 9.7 9.0 5.0 7.2  NEUTROABS 4.7 2.9  --   --   --  3.3  --   HGB 12.3* 12.7*   < > 11.4* 11.1* 12.8* 12.7*  HCT 35.9* 37.0*   < > 33.3* 32.7* 38.5* 37*  MCV 93.5 92.7   < > 93.0 94.0 94.1  --   PLT 177 163   < > 157 162 216 356   < > = values in this interval not displayed.   Cardiac Enzymes: Recent Labs    11/02/21 1855 11/03/21 0537  CKTOTAL 495* 382   BNP: Invalid input(s): "POCBNP" Lab Results  Component Value Date   HGBA1C 5.3 05/12/2022   Lab Results  Component Value Date   TSH 2.039 11/02/2021   Lab Results  Component Value Date   VITAMINB12 661 02/28/2020   No results found for: "FOLATE" Lab Results  Component Value Date   FERRITIN 256 11/03/2021    Imaging and Procedures obtained prior to SNF admission: CT Angio Chest Pulmonary Embolism (PE) W or WO Contrast  Result Date: 05/12/2022 CLINICAL DATA:  Pleural effusion, history of blood clotting disorder, question pulmonary embolism EXAM: CT ANGIOGRAPHY CHEST WITH CONTRAST TECHNIQUE: Multidetector CT imaging of the chest was performed using the standard protocol during bolus administration of intravenous contrast. Multiplanar CT image reconstructions and MIPs were obtained to evaluate the vascular anatomy. Due to history of contrast allergy patient was premedicated for contrast administration. Patient suffered no reaction to IV contrast. RADIATION DOSE REDUCTION: This exam was performed according to the departmental dose-optimization program which includes automated exposure control,  adjustment of the mA and/or kV according to patient size and/or use of iterative reconstruction technique. CONTRAST:  75mL OMNIPAQUE IOHEXOL 350 MG/ML SOLN IV COMPARISON:  11/03/2021 FINDINGS: Cardiovascular: Atherosclerotic calcifications aorta, proximal great vessels, and coronary arteries. Aorta normal caliber without aneurysm or dissection. No pericardial effusion. Minimal scattered pericardial calcification noted. Pulmonary arteries adequately opacified and patent. No evidence of pulmonary embolism. Mediastinum/Nodes: Large hiatal hernia. Esophagus unremarkable. Base of cervical region normal appearance. No thoracic adenopathy. Lungs/Pleura: Dependent atelectasis in the posterior lungs bilaterally. Lungs otherwise clear. No pulmonary infiltrate, pleural effusion, or pneumothorax. Upper Abdomen: Cholelithiasis. Simple appearing cyst medial upper RIGHT kidney 3.0 x 2.5 cm; no follow-up imaging recommended. Diverticulosis at splenic flexure of colon. Musculoskeletal: No acute osseous findings. Review of the MIP images confirms the above findings. IMPRESSION: No evidence of pulmonary embolism. Large hiatal  hernia. Dependent atelectasis in the posterior lungs bilaterally. Cholelithiasis. Diverticulosis at splenic flexure of colon. Scattered atherosclerotic calcifications including coronary arteries. Aortic Atherosclerosis (ICD10-I70.0). Electronically Signed   By: Ulyses Southward M.D.   On: 05/12/2022 15:55    Assessment/Plan 1. CAD S/P percutaneous coronary angioplasty Denies chest pain; had stenting in February 2023; takes Plavix to prevent clot formation  2. Closed fracture of distal end of right humerus, unspecified fracture morphology, initial encounter Will continue to wear hinged splint  3. Hyperipidemia, unspecified hyperlipidemia type Takes atorvastatin; do not see recent lipid levels  4. Idiopathic peripheral neuropathy Continues with gabapentin is effective  5. Recurrent falls No recent falls  patient has good memory and recognizes his limitations with good safety awareness    Family/ staff Communication:   Labs/tests ordered:  Bertram Millard. Hyacinth Meeker, MD [facility] [address] [place], [place] [address] Office [phone]

## 2022-10-15 ENCOUNTER — Encounter: Payer: Self-pay | Admitting: Nurse Practitioner

## 2022-10-15 ENCOUNTER — Non-Acute Institutional Stay (SKILLED_NURSING_FACILITY): Payer: Medicare Other | Admitting: Nurse Practitioner

## 2022-10-15 DIAGNOSIS — I2102 ST elevation (STEMI) myocardial infarction involving left anterior descending coronary artery: Secondary | ICD-10-CM | POA: Diagnosis not present

## 2022-10-15 DIAGNOSIS — I10 Essential (primary) hypertension: Secondary | ICD-10-CM | POA: Diagnosis not present

## 2022-10-15 DIAGNOSIS — I5189 Other ill-defined heart diseases: Secondary | ICD-10-CM

## 2022-10-15 DIAGNOSIS — E871 Hypo-osmolality and hyponatremia: Secondary | ICD-10-CM | POA: Diagnosis not present

## 2022-10-15 DIAGNOSIS — M159 Polyosteoarthritis, unspecified: Secondary | ICD-10-CM

## 2022-10-15 DIAGNOSIS — R238 Other skin changes: Secondary | ICD-10-CM

## 2022-10-15 DIAGNOSIS — I480 Paroxysmal atrial fibrillation: Secondary | ICD-10-CM | POA: Diagnosis not present

## 2022-10-15 DIAGNOSIS — E785 Hyperlipidemia, unspecified: Secondary | ICD-10-CM | POA: Diagnosis not present

## 2022-10-15 DIAGNOSIS — Z86711 Personal history of pulmonary embolism: Secondary | ICD-10-CM | POA: Diagnosis not present

## 2022-10-15 DIAGNOSIS — K219 Gastro-esophageal reflux disease without esophagitis: Secondary | ICD-10-CM

## 2022-10-15 DIAGNOSIS — E876 Hypokalemia: Secondary | ICD-10-CM

## 2022-10-15 NOTE — Assessment & Plan Note (Signed)
c/o right buttock/perineal area pain, no skin breakdown or rash seen, but mild diffused redness, blanchable,  from moist and sitting in power w/c noted. Encourage frequent repositioning, bedrest between meals.

## 2022-10-15 NOTE — Assessment & Plan Note (Signed)
on Kcl K 4.0 05/11/22 

## 2022-10-15 NOTE — Assessment & Plan Note (Signed)
Blood pressure is loose controlled, on Metoprolol

## 2022-10-15 NOTE — Assessment & Plan Note (Signed)
03/24/23 right elbow fx(distal end of right humerus), mild swelling fingers, pain is well controlled, in hinged elbow ROM brace 

## 2022-10-15 NOTE — Assessment & Plan Note (Signed)
on Gabapentin, powder wheelchair for mobility.

## 2022-10-15 NOTE — Assessment & Plan Note (Signed)
on Atorvastatin 

## 2022-10-15 NOTE — Assessment & Plan Note (Signed)
Heart rate is in continue,  off Eliquis 08/09/22 per cardiology, on  Metoprolol, ASA, Plavix.

## 2022-10-15 NOTE — Assessment & Plan Note (Signed)
thing liquid, hx of dilatation,  takes Pantoprazole, Hgb 12.7 03/30/22 

## 2022-10-15 NOTE — Assessment & Plan Note (Signed)
PE 11/02/21, had CTA 05/12/22, 09/08/22 Cardiology dc Eliquis, start ASA 81mg qd, continue Plavix 75mg qd. Repeated CT @ 6 mos,  negative PE 

## 2022-10-15 NOTE — Assessment & Plan Note (Signed)
Grade 2 diastolic dysfunction, compensated 

## 2022-10-15 NOTE — Assessment & Plan Note (Signed)
Na 134 05/11/22 

## 2022-10-15 NOTE — Progress Notes (Unsigned)
Location:  [facility] Room Number: NO/65/A Place of Service:  SNF (31) Provider:  Lucifer Soja X, NP  Patient Care Team: Joleen Stuckert X, NP as PCP - General (Internal Medicine) Lennette Bihari, MD as PCP - Cardiology (Cardiology) Ernesto Rutherford, MD as Consulting Physician (Ophthalmology) Luciana Axe Alford Highland, MD as Consulting Physician (Ophthalmology)  Extended Emergency Contact Information Primary Emergency Contact: Elspeth Cho [place] Home Phone: [phone] Mobile Phone: [phone] Relation: Daughter Secondary Emergency Contact: Doree Albee [place] Home Phone: [phone] Relation: Daughter  Code Status:  FULL Goals of care: Advanced Directive information    09/10/2022    8:42 AM  Advanced Directives  Does Patient Have a Medical Advance Directive? Yes  Type of Advance Directive Living will  Does patient want to make changes to medical advance directive? No - Patient declined     Chief Complaint  Patient presents with  . Acute Visit    Elevated BP need 2nd reading for care gap metric     HPI:  Pt is a 89 y.o. male seen today for an acute visit for c/o right buttock/perineal area pain, no skin breakdown or rash seen, but mild diffused redness, blanchable,  from moist and sitting in power w/c noted.  03/24/23 right elbow fx(distal end of right humerus), mild swelling fingers, pain is well controlled, in hinged elbow ROM brace             CAD ST elevation MI LAD 05/2021, underwent Cath, stenting, s/p percutaneous coronary angioplasty, started DAPT, Echo EF 40-45%,  65-70% 11/02/21, started Plavix 12/24/21 per [facility]             PE 11/02/21, had CTA 05/12/22, 09/08/22 Cardiology dc Eliquis, start ASA 81mg  qd, continue Plavix 75mg  qd. Repeated CT @ 6 mos,  negative PE             Grade 2 diastolic dysfunction, compensated             Afib, off Eliquis 08/09/22 per cardiology, on  Metoprolol, Plavix, ASA             GERD,  thing liquid, hx of dilatation,  takes Pantoprazole, Hgb 12.7 03/30/22             Hypokalemia, on Kcl K 4.0 05/11/22             Hyponatremia, Na 134 05/11/22             HLD on Atorvastatin             HTN, on Metoprolol             Peripheral neuropathy, on Gabapentin    Past Medical History:  Diagnosis Date  . Arthritis   . Diverticulosis of colon   . External thrombosed hemorrhoids 06/11/2009   Qualifier: Diagnosis of  By: Clent Ridges MD, Tera Mater   . GERD (gastroesophageal reflux disease)   . Hypertension    Past Surgical History:  Procedure Laterality Date  . cataract extaction, right  6/09   per Dr. Kennedy Bucker  . CATARACT EXTRACTION Left   . COLONOSCOPY  09/23/04   per Dr. Sherin Quarry, clear, no repeats needed   . CORONARY STENT INTERVENTION N/A 06/06/2021   Procedure: CORONARY STENT INTERVENTION;  Surgeon: Yvonne Kendall, MD;  Location: [facility];  Service: Cardiovascular;  Laterality: N/A;  . CORONARY/GRAFT ACUTE MI REVASCULARIZATION N/A 06/06/2021   Procedure: Coronary/Graft Acute MI Revascularization;  Surgeon: Yvonne Kendall,  MD;  Location: [facility];  Service: Cardiovascular;  Laterality: N/A;  . EGD and esophageal diatation,  05-10-11   per Dr. Christella Hartigan    . HERNIA REPAIR    . LEFT HEART CATH AND CORONARY ANGIOGRAPHY N/A 06/06/2021   Procedure: LEFT HEART CATH AND CORONARY ANGIOGRAPHY;  Surgeon: Yvonne Kendall, MD;  Location: [facility];  Service: Cardiovascular;  Laterality: N/A;  . rt inguinal hernia     x2    Allergies  Allergen Reactions  . Contrast Media [Iodinated Contrast Media] Rash    Outpatient Encounter Medications as of 10/15/2022  Medication Sig  . acetaminophen (TYLENOL) 325 MG tablet Take 650 mg by mouth every 6 (six) hours as needed.  Marland Kitchen aspirin EC 81 MG tablet Take 1 tablet (81 mg total) by mouth daily. Swallow whole.  Marland Kitchen atorvastatin (LIPITOR) 40 MG tablet Take 1 tablet (40 mg total) by mouth daily.  . clopidogrel (PLAVIX) 75 MG  tablet Take 75 mg by mouth daily.  Marland Kitchen docusate sodium (COLACE) 100 MG capsule Take 100 mg by mouth 2 (two) times daily.  Marland Kitchen gabapentin (NEURONTIN) 300 MG capsule TAKE 1 CAPSULE BY MOUTH THREE TIMES A DAY  . metoprolol tartrate (LOPRESSOR) 25 MG tablet Take 1 tablet (25 mg) by mouth in the AM, then take 0.5 tablet (12.5 mg) by mouth in the PM.  . pantoprazole (PROTONIX) 40 MG tablet Take 1 tablet (40 mg total) by mouth daily.  . potassium chloride (KLOR-CON 10) 10 MEQ tablet Take 1 tablet (10 mEq total) by mouth daily.  . TROLAMINE SALICYLATE EX Apply 1 Application topically as needed for muscle pain. 4%  . vitamin C (ASCORBIC ACID) 500 MG tablet Take 500 mg by mouth daily. Reported on 10/27/2015  . nitroGLYCERIN (NITROSTAT) 0.4 MG SL tablet Place 1 tablet (0.4 mg total) under the tongue every 5 (five) minutes as needed for chest pain.   No facility-administered encounter medications on file as of 10/15/2022.    Review of Systems  Constitutional:  Negative for appetite change, fatigue and fever.  HENT:  Negative for congestion and trouble swallowing.   Eyes:  Negative for visual disturbance.  Respiratory:  Negative for cough and shortness of breath.   Cardiovascular:  Negative for leg swelling.  Gastrointestinal:  Negative for abdominal pain and constipation.  Genitourinary:  Negative for dysuria, frequency and urgency.       Incontinent of urine  Musculoskeletal:  Positive for arthralgias and gait problem. Negative for joint swelling.  Skin:  Negative for color change, rash and wound.  Neurological:  Negative for speech difficulty, weakness and headaches.  Psychiatric/Behavioral:  Negative for confusion and sleep disturbance. The patient is not nervous/anxious.     Immunization History  Administered Date(s) Administered  . Covid-19, Mrna,Vaccine(Spikevax)71yrs and older 03/22/2022  . Fluad Quad(high Dose 65+) 02/15/2022  . Influenza Split 01/20/2012, 01/07/2013  . Influenza Whole  02/09/2006, 01/11/2008  . Influenza, High Dose Seasonal PF 12/30/2016, 12/27/2017, 01/08/2019  . Influenza,inj,quad, With Preservative 12/29/2018  . Influenza-Unspecified 01/17/2014, 01/14/2016, 12/30/2016  . PFIZER Comirnaty(Gray Top)Covid-19 Tri-Sucrose Vaccine 05/28/2019, 03/04/2020, 09/23/2020  . [employer] officer, trade [employer] 51yrs & up 02/25/2022  . Pneumococcal Conjugate-13 09/23/2015  . Pneumococcal Polysaccharide-23 03/13/2008  . Td 12/20/2007  . Tdap 07/06/2021  . Unspecified SARS-COV-2 Vaccination 04/30/2019  . Zoster Recombinat (Shingrix) 03/11/2020, 06/20/2020   Pertinent  Health Maintenance Due  Topic Date Due  . HEMOGLOBIN A1C  11/10/2022  . INFLUENZA VACCINE  11/25/2022  .  FOOT EXAM  03/09/2023  . OPHTHALMOLOGY EXAM  Discontinued      05/03/2022   12:35 PM 05/04/2022    2:44 PM 06/01/2022   11:04 AM 06/15/2022    9:14 AM 06/23/2022    3:12 PM  Fall Risk  Falls in the past year? 1 1 1 1 1   Was there an injury with Fall? 1 1 1 1 1   Was there an injury with Fall? - Comments     Fx Rt Ekbow. Followed by Orthopedic  Fall Risk Category Calculator 3 3 3 3 2   Fall Risk Category (Retired) [employer]     (RETIRED) Patient Fall Risk Level High fall risk High fall risk     Patient at Risk for Falls Due to History of fall(s);Impaired balance/gait;Impaired mobility History of fall(s);Impaired balance/gait;Impaired mobility;Impaired vision History of fall(s);Impaired balance/gait;Impaired mobility;Impaired vision History of fall(s);Impaired balance/gait;Impaired mobility Orthopedic patient  Fall risk Follow up Falls evaluation completed Falls evaluation completed Falls evaluation completed Falls evaluation completed Falls prevention discussed   Functional Status Survey:    Vitals:   10/15/22 0958  BP: (!) 155/67  Pulse: 74  Resp: 20  Temp: 97.6 F (36.4 C)  SpO2: 96%  Weight: 155 lb 9.6 oz (70.6 kg)  Height: 5\' 7"  (1.702 m)   Body mass index is 24.37  kg/m. Physical Exam Vitals and nursing note reviewed.  Constitutional:      Appearance: Normal appearance.  HENT:     Head: Normocephalic.     Comments:      Nose: Nose normal.     Mouth/Throat:     Mouth: Mucous membranes are moist.  Eyes:     Extraocular Movements: Extraocular movements intact.     Conjunctiva/sclera: Conjunctivae normal.     Pupils: Pupils are equal, round, and reactive to light.  Cardiovascular:     Rate and Rhythm: Normal rate and regular rhythm.     Heart sounds: No murmur heard. Pulmonary:     Effort: Pulmonary effort is normal.     Breath sounds: Normal breath sounds. No rales.  Abdominal:     General: Bowel sounds are normal.     Palpations: Abdomen is soft.     Tenderness: There is no abdominal tenderness.  Musculoskeletal:     Cervical back: Normal range of motion and neck supple.     Right lower leg: No edema.     Left lower leg: No edema.     Comments: Right elbow in a hinged ROM brace, able to make a fist, denied pain or numbness.   Skin:    General: Skin is warm and dry.     Findings: No bruising, erythema or rash.     Comments: c/o right buttock/perineal area pain, no skin breakdown or rash seen, but mild diffused redness, blanchable,  from moist and sitting in power w/c noted.    Neurological:     General: No focal deficit present.     Mental Status: He is alert and oriented to person, place, and time. Mental status is at baseline.     Gait: Gait abnormal.  Psychiatric:        Mood and Affect: Mood normal.        Behavior: Behavior normal.        Thought Content: Thought content normal.    Labs reviewed: Recent Labs    11/02/21 1640 11/02/21 1648 11/02/21 2130 11/03/21 0537 11/04/21 0548 03/23/22 2255 03/30/22 0000 04/20/22 0000 05/11/22 0800  NA 132*  --  139 133*  132* 134* 132* 131* 133* 134*  K 3.7  --  2.4* 4.1  4.0 3.8 4.1 4.4 4.0 4.0  CL 105  --  120* 104  104 108 101 98* 99 100  CO2 18*  --  12* 21*  21* 19* 23  26* 26* 27*  GLUCOSE 116*  --  97 155*  162* 154* 148*  --   --   --   BUN 18  --  11 16  16 16 16 13 7 10   CREATININE 0.72  --  0.36* 0.51*  0.52* 0.51* 0.77 0.5* 0.5* 0.6  CALCIUM 8.5*  --  5.6* 8.3*  8.3* 8.0* 8.5* 8.5* 9.0 8.8  MG 1.9  --  1.3* 1.9  --   --   --   --   --   PHOS  --  3.3  --  4.4  --   --   --   --   --    Recent Labs    11/02/21 2130 11/03/21 0537 11/04/21 0548  AST 45* 61* 63*  ALT 24 39 37  ALKPHOS 52 83 74  BILITOT 1.3* 1.5* 0.8  PROT 3.7* 5.9* 5.2*  ALBUMIN 1.8* 3.1* 2.7*   Recent Labs    11/02/21 1640 11/03/21 0537 11/03/21 1737 11/04/21 0548 11/05/21 0450 03/23/22 2255 03/30/22 0000  WBC 7.0 3.8*   < > 9.7 9.0 5.0 7.2  NEUTROABS 4.7 2.9  --   --   --  3.3  --   HGB 12.3* 12.7*   < > 11.4* 11.1* 12.8* 12.7*  HCT 35.9* 37.0*   < > 33.3* 32.7* 38.5* 37*  MCV 93.5 92.7   < > 93.0 94.0 94.1  --   PLT 177 163   < > 157 162 216 356   < > = values in this interval not displayed.   Lab Results  Component Value Date   TSH 2.039 11/02/2021   Lab Results  Component Value Date   HGBA1C 5.3 05/12/2022   Lab Results  Component Value Date   CHOL 93 10/14/2021   HDL 45.60 10/14/2021   LDLCALC 33 10/14/2021   LDLDIRECT 72.0 09/27/2017   TRIG 71.0 10/14/2021   CHOLHDL 2 10/14/2021    Significant Diagnostic Results in last 30 days:  No results found.  Assessment/Plan Skin irritation c/o right buttock/perineal area pain, no skin breakdown or rash seen, but mild diffused redness, blanchable,  from moist and sitting in power w/c noted. Encourage frequent repositioning, bedrest between meals.   Osteoarthritis 03/24/23 right elbow fx(distal end of right humerus), mild swelling fingers, pain is well controlled, in hinged elbow ROM brace  STEMI (ST elevation myocardial infarction) (HCC) CAD ST elevation MI LAD 05/2021, underwent Cath, stenting, s/p percutaneous coronary angioplasty, started DAPT, Echo EF 40-45%,  65-70% 11/02/21, started Plavix  12/24/21 per [facility]  History of pulmonary embolism  PE 11/02/21, had CTA 05/12/22, 09/08/22 Cardiology dc Eliquis, start ASA 81mg  qd, continue Plavix 75mg  qd. Repeated CT @ 6 mos,  negative PE  Diastolic dysfunction  Grade 2 diastolic dysfunction, compensated  Paroxysmal atrial fibrillation (HCC) Heart rate is in continue,  off Eliquis 08/09/22 per cardiology, on  Metoprolol, ASA, Plavix.   GERD thing liquid, hx of dilatation,  takes Pantoprazole, Hgb 12.7 03/30/22  Hypokalemia on Kcl K 4.0 05/11/22  Hyponatremia Na 134 05/11/22  Hyperlipidemia on Atorvastatin  Essential hypertension Blood pressure is loose controlled, on Metoprolol     Family/ staff  Communication: plan of care reviewed with the patient and charge nurse.   Labs/tests ordered:  none  Time spend 35 minutes.

## 2022-10-15 NOTE — Assessment & Plan Note (Signed)
CAD ST elevation MI LAD 05/2021, underwent Cath, stenting, s/p percutaneous coronary angioplasty, started DAPT, Echo EF 40-45%,  65-70% 11/02/21, started Plavix 12/24/21 per [facility] 

## 2022-10-18 DIAGNOSIS — S42411A Displaced simple supracondylar fracture without intercondylar fracture of right humerus, initial encounter for closed fracture: Secondary | ICD-10-CM | POA: Diagnosis not present

## 2022-10-19 ENCOUNTER — Encounter: Payer: Self-pay | Admitting: Family Medicine

## 2022-10-19 ENCOUNTER — Ambulatory Visit (INDEPENDENT_AMBULATORY_CARE_PROVIDER_SITE_OTHER): Payer: Medicare Other | Admitting: Family Medicine

## 2022-10-19 VITALS — BP 118/60 | HR 72 | Temp 98.2°F | Wt 156.0 lb

## 2022-10-19 DIAGNOSIS — I480 Paroxysmal atrial fibrillation: Secondary | ICD-10-CM

## 2022-10-19 DIAGNOSIS — R296 Repeated falls: Secondary | ICD-10-CM | POA: Diagnosis not present

## 2022-10-19 DIAGNOSIS — I10 Essential (primary) hypertension: Secondary | ICD-10-CM

## 2022-10-19 DIAGNOSIS — I2102 ST elevation (STEMI) myocardial infarction involving left anterior descending coronary artery: Secondary | ICD-10-CM

## 2022-10-19 DIAGNOSIS — K219 Gastro-esophageal reflux disease without esophagitis: Secondary | ICD-10-CM | POA: Diagnosis not present

## 2022-10-19 DIAGNOSIS — I5189 Other ill-defined heart diseases: Secondary | ICD-10-CM

## 2022-10-19 DIAGNOSIS — M159 Polyosteoarthritis, unspecified: Secondary | ICD-10-CM

## 2022-10-19 DIAGNOSIS — E785 Hyperlipidemia, unspecified: Secondary | ICD-10-CM

## 2022-10-19 DIAGNOSIS — N401 Enlarged prostate with lower urinary tract symptoms: Secondary | ICD-10-CM

## 2022-10-19 DIAGNOSIS — R739 Hyperglycemia, unspecified: Secondary | ICD-10-CM

## 2022-10-19 DIAGNOSIS — Z86711 Personal history of pulmonary embolism: Secondary | ICD-10-CM

## 2022-10-19 DIAGNOSIS — R600 Localized edema: Secondary | ICD-10-CM

## 2022-10-19 NOTE — Progress Notes (Signed)
Subjective:    Patient ID: Gabriel [name], male    DOB: 09-04-1924, 89 y.o.   MRN: [id number]  HPI Here with his daughter to follow up on issues. In general he is doing well. He is very happy at [facility]. He fell on 03-23-22 and fractured his right humerus. He has been cared for by Dr. Waylan Rocher. He did not require surgery, so he has been wearing a brace and getting PT. His HTN has been well controlled. His GERD is stable, and he has an excellent appetite. His atrial fibrillation has not been a problem, and his HR is well controlled. He sees Dr. Tresa Endo twice a year, and his next visit with him will be on 04-12-23. His only complaints today are itching under the brace on his right arm, and pain and stiffness in the neck. He has advanced OA in the cervical spine. He gets relief by applying cortisone cream to his arm and BioFreeze to his neck.    Review of Systems  Constitutional: Negative.   HENT: Negative.    Eyes: Negative.   Respiratory: Negative.    Cardiovascular: Negative.   Gastrointestinal: Negative.   Genitourinary: Negative.   Musculoskeletal:  Positive for arthralgias and gait problem.  Skin: Negative.   Neurological:  Positive for weakness.  Psychiatric/Behavioral: Negative.         Objective:   Physical Exam Constitutional:      General: He is not in acute distress.    Appearance: Normal appearance. He is well-developed. He is not diaphoretic.     Comments: He is in a motorized scooter   HENT:     Head: Normocephalic and atraumatic.     Right Ear: External ear normal.     Left Ear: External ear normal.     Nose: Nose normal.     Mouth/Throat:     Pharynx: No oropharyngeal exudate.  Eyes:     General: No scleral icterus.       Right eye: No discharge.        Left eye: No discharge.     Conjunctiva/sclera: Conjunctivae normal.     Pupils: Pupils are equal, round, and reactive to light.  Neck:     Thyroid: No thyromegaly.     Vascular: No JVD.      Trachea: No tracheal deviation.  Cardiovascular:     Rate and Rhythm: Normal rate. Rhythm irregular.     Pulses: Normal pulses.     Heart sounds: Normal heart sounds. No murmur heard.    No friction rub. No gallop.  Pulmonary:     Effort: Pulmonary effort is normal. No respiratory distress.     Breath sounds: Normal breath sounds. No wheezing or rales.  Chest:     Chest wall: No tenderness.  Abdominal:     General: Bowel sounds are normal. There is no distension.     Palpations: Abdomen is soft. There is no mass.     Tenderness: There is no abdominal tenderness. There is no guarding or rebound.  Genitourinary:    Penis: Normal. No tenderness.      Testes: Normal.     Prostate: Normal.     Rectum: Normal. Guaiac result negative.  Musculoskeletal:        General: No tenderness. Normal range of motion.     Cervical back: Neck supple.     Comments: Wearing a hinged brace on the right arm   Lymphadenopathy:     Cervical: No cervical  adenopathy.  Skin:    General: Skin is warm and dry.     Coloration: Skin is not pale.     Findings: No erythema or rash.  Neurological:     General: No focal deficit present.     Mental Status: He is alert and oriented to person, place, and time.     Cranial Nerves: No cranial nerve deficit.     Motor: No abnormal muscle tone.     Coordination: Coordination normal.     Deep Tendon Reflexes: Reflexes are normal and symmetric. Reflexes normal.  Psychiatric:        Mood and Affect: Mood normal.        Behavior: Behavior normal.        Thought Content: Thought content normal.        Judgment: Judgment normal.           Assessment & Plan:  He is doing well all in all. His HTN and atrial fibrillation are stable. His GERD is stable. His CAD is stable. He is recovering from a right humeral fracture. We will get fasting labs for lipids, etc. We wrote orders for him to be able to apply 1% hydrocortisone cream to the right arm and to apply BioFreeze to  the neck as needed.  We spent a total of (33   ) minutes reviewing records and discussing these issues.  Gershon Crane, MD

## 2022-11-01 ENCOUNTER — Other Ambulatory Visit: Payer: PRIVATE HEALTH INSURANCE

## 2022-11-10 ENCOUNTER — Non-Acute Institutional Stay (SKILLED_NURSING_FACILITY): Payer: Medicare Other | Admitting: Nurse Practitioner

## 2022-11-10 ENCOUNTER — Encounter: Payer: Self-pay | Admitting: Nurse Practitioner

## 2022-11-10 DIAGNOSIS — Z86711 Personal history of pulmonary embolism: Secondary | ICD-10-CM

## 2022-11-10 DIAGNOSIS — I5189 Other ill-defined heart diseases: Secondary | ICD-10-CM

## 2022-11-10 DIAGNOSIS — I2510 Atherosclerotic heart disease of native coronary artery without angina pectoris: Secondary | ICD-10-CM

## 2022-11-10 DIAGNOSIS — M159 Polyosteoarthritis, unspecified: Secondary | ICD-10-CM | POA: Diagnosis not present

## 2022-11-10 DIAGNOSIS — E876 Hypokalemia: Secondary | ICD-10-CM

## 2022-11-10 DIAGNOSIS — K219 Gastro-esophageal reflux disease without esophagitis: Secondary | ICD-10-CM

## 2022-11-10 DIAGNOSIS — I10 Essential (primary) hypertension: Secondary | ICD-10-CM

## 2022-11-10 DIAGNOSIS — I480 Paroxysmal atrial fibrillation: Secondary | ICD-10-CM

## 2022-11-10 DIAGNOSIS — E785 Hyperlipidemia, unspecified: Secondary | ICD-10-CM

## 2022-11-10 DIAGNOSIS — E871 Hypo-osmolality and hyponatremia: Secondary | ICD-10-CM

## 2022-11-10 DIAGNOSIS — Z9861 Coronary angioplasty status: Secondary | ICD-10-CM

## 2022-11-10 DIAGNOSIS — G609 Hereditary and idiopathic neuropathy, unspecified: Secondary | ICD-10-CM

## 2022-11-10 NOTE — Assessment & Plan Note (Signed)
ST elevation MI LAD 05/2021, underwent Cath, stenting, s/p percutaneous coronary angioplasty, started DAPT, Echo EF 40-45%,  65-70% 11/02/21, started Plavix 12/24/21 per [facility] 

## 2022-11-10 NOTE — Assessment & Plan Note (Signed)
 Blood pressure is controlled, on Metoprolol

## 2022-11-10 NOTE — Progress Notes (Signed)
Location:   [facility] Room Number: 49 A Place of Service:  SNF (31) Provider:  Uri Turnbough X, NP  Galileo Colello X, NP  Patient Care Team: Urijah Raynor X, NP as PCP - General (Internal Medicine) Lennette Bihari, MD as PCP - Cardiology (Cardiology) Ernesto Rutherford, MD as Consulting Physician (Ophthalmology) Luciana Axe Alford Highland, MD as Consulting Physician (Ophthalmology)  Extended Emergency Contact Information Primary Emergency Contact: Elspeth Cho [place] Home Phone: [phone] Mobile Phone: [phone] Relation: Daughter Secondary Emergency Contact: Doree Albee [place] Home Phone: [phone] Relation: Daughter  Code Status:  FULL CODE Goals of care: Advanced Directive information    11/10/2022   10:58 AM  Advanced Directives  Does Patient Have a Medical Advance Directive? Yes  Type of Estate agent of [place];Living will  Does patient want to make changes to medical advance directive? No - Patient declined  Copy of Healthcare Power of Attorney in Chart? Yes - validated most recent copy scanned in chart (See row information)     Chief Complaint  Patient presents with   Medical Management of Chronic Issues    Routine follow up   Quality Metric Gaps    Hemoglobin A1C due    HPI:  Pt is a 89 y.o. male seen today for medical management of chronic diseases.   03/24/23 right elbow fx(distal end of right humerus), mild swelling fingers, pain is well controlled, in hinged elbow ROM brace             CAD ST elevation MI LAD 05/2021, underwent Cath, stenting, s/p percutaneous coronary angioplasty, started DAPT, Echo EF 40-45%,  65-70% 11/02/21, started Plavix 12/24/21 per [facility]             PE 11/02/21, had CTA 05/12/22, 09/08/22 Cardiology dc Eliquis, start ASA 81mg  qd, continue Plavix 75mg  qd. Repeated CT @ 6 mos,  negative PE             Grade 2 diastolic dysfunction, compensated             Afib, off  Eliquis 08/09/22 per cardiology, on  Metoprolol, Plavix, ASA             GERD, thing liquid, hx of dilatation,  takes Pantoprazole, Hgb 12.7 03/30/22             Hypokalemia, on Kcl K 4.0 05/11/22             Hyponatremia, Na 134 05/11/22             HLD on Atorvastatin             HTN, on Metoprolol             Peripheral neuropathy, on Gabapentin  Past Medical History:  Diagnosis Date   Arthritis    Diverticulosis of colon    External thrombosed hemorrhoids 06/11/2009   Qualifier: Diagnosis of  By: Clent Ridges MD, Tera Mater    GERD (gastroesophageal reflux disease)    Hypertension    Past Surgical History:  Procedure Laterality Date   cataract extaction, right  6/09   per Dr. Kennedy Bucker   CATARACT EXTRACTION Left    COLONOSCOPY  09/23/04   per Dr. Sherin Quarry, clear, no repeats needed    CORONARY STENT INTERVENTION N/A 06/06/2021   Procedure: CORONARY STENT INTERVENTION;  Surgeon: Yvonne Kendall, MD;  Location: [facility];  Service: Cardiovascular;  Laterality: N/A;   CORONARY/GRAFT ACUTE MI REVASCULARIZATION  N/A 06/06/2021   Procedure: Coronary/Graft Acute MI Revascularization;  Surgeon: Yvonne Kendall, MD;  Location: [facility];  Service: Cardiovascular;  Laterality: N/A;   EGD and esophageal diatation,  05-10-11   per Dr. Christella Hartigan     HERNIA REPAIR     LEFT HEART CATH AND CORONARY ANGIOGRAPHY N/A 06/06/2021   Procedure: LEFT HEART CATH AND CORONARY ANGIOGRAPHY;  Surgeon: Yvonne Kendall, MD;  Location: [facility];  Service: Cardiovascular;  Laterality: N/A;   rt inguinal hernia     x2    Allergies  Allergen Reactions   Contrast Media [Iodinated Contrast Media] Rash    Allergies as of 11/10/2022       Reactions   Contrast Media [iodinated Contrast Media] Rash        Medication List        Accurate as of November 10, 2022 12:40 PM. If you have any questions, ask your nurse or doctor.          STOP taking these medications    ascorbic acid 500 MG  tablet Commonly known as: VITAMIN C Stopped by: Gisel Vipond X Aadya Kindler   metoprolol tartrate 25 MG tablet Commonly known as: LOPRESSOR Stopped by: Roshawn Lacina X Marvens Hollars   TROLAMINE SALICYLATE EX Stopped by: Laurence Folz X Saron Vanorman       TAKE these medications    acetaminophen 325 MG tablet Commonly known as: TYLENOL Take 650 mg by mouth every 6 (six) hours as needed.   aspirin EC 81 MG tablet Take 1 tablet (81 mg total) by mouth daily. Swallow whole.   atorvastatin 40 MG tablet Commonly known as: LIPITOR Take 1 tablet (40 mg total) by mouth daily.   Biofreeze Roll-On 4 % Gel Generic drug: Menthol (Topical Analgesic) Apply topically. Apply to neck topically two times a day   clopidogrel 75 MG tablet Commonly known as: PLAVIX Take 75 mg by mouth daily.   docusate sodium 100 MG capsule Commonly known as: COLACE Take 100 mg by mouth 2 (two) times daily.   gabapentin 300 MG capsule Commonly known as: NEURONTIN TAKE 1 CAPSULE BY MOUTH THREE TIMES A DAY   hydrocortisone cream 1 % Apply 1 Application topically 2 (two) times daily. Apply to right arm topically two times a day related to PAIN IN RIGHT ELBOW   metoprolol succinate 25 MG 24 hr tablet Commonly known as: TOPROL-XL Take 12.5 mg by mouth daily.   nitroGLYCERIN 0.4 MG SL tablet Commonly known as: Nitrostat Place 1 tablet (0.4 mg total) under the tongue every 5 (five) minutes as needed for chest pain.   pantoprazole 40 MG tablet Commonly known as: PROTONIX Take 1 tablet (40 mg total) by mouth daily.   potassium chloride 10 MEQ tablet Commonly known as: Klor-Con 10 Take 1 tablet (10 mEq total) by mouth daily.        Review of Systems  Constitutional:  Negative for appetite change, fatigue and fever.  HENT:  Negative for congestion and trouble swallowing.   Eyes:  Negative for visual disturbance.  Respiratory:  Negative for cough and shortness of breath.   Cardiovascular:  Negative for leg swelling.  Gastrointestinal:  Negative for  abdominal pain and constipation.  Genitourinary:  Negative for dysuria, frequency and urgency.       Incontinent of urine  Musculoskeletal:  Positive for arthralgias and gait problem. Negative for joint swelling.  Skin:  Negative for color change, rash and wound.  Neurological:  Negative for speech difficulty, weakness and headaches.  Psychiatric/Behavioral:  Negative for confusion and sleep disturbance. The patient is not nervous/anxious.     Immunization History  Administered Date(s) Administered   Covid-19, Mrna,Vaccine(Spikevax)51yrs and older 03/22/2022   Fluad Quad(high Dose 65+) 02/15/2022   Influenza Split 01/20/2012, 01/07/2013   Influenza Whole 02/09/2006, 01/11/2008   Influenza, High Dose Seasonal PF 12/30/2016, 12/27/2017, 01/08/2019   Influenza,inj,quad, With Preservative 12/29/2018   Influenza-Unspecified 01/17/2014, 01/14/2016, 12/30/2016   PFIZER Comirnaty(Gray Top)Covid-19 Tri-Sucrose Vaccine 05/28/2019, 03/04/2020, 09/23/2020   Pfizer Covid-19 Vaccine Bivalent Booster 49yrs & up 02/25/2022   Pneumococcal Conjugate-13 09/23/2015   Pneumococcal Polysaccharide-23 03/13/2008   Td 12/20/2007   Tdap 07/06/2021   Unspecified SARS-COV-2 Vaccination 04/30/2019   Zoster Recombinant(Shingrix) 03/11/2020, 06/20/2020   Pertinent  Health Maintenance Due  Topic Date Due   HEMOGLOBIN A1C  11/10/2022   INFLUENZA VACCINE  11/25/2022   FOOT EXAM  03/09/2023   OPHTHALMOLOGY EXAM  Discontinued      05/04/2022    2:44 PM 06/01/2022   11:04 AM 06/15/2022    9:14 AM 06/23/2022    3:12 PM 10/19/2022   11:19 AM  Fall Risk  Falls in the past year? 1 1 1 1  Exclusion - non ambulatory  Was there an injury with Fall? 1 1 1 1    Was there an injury with Fall? - Comments    Fx Rt Ekbow. Followed by Orthopedic   Fall Risk Category Calculator 3 3 3 2    Fall Risk Category (Retired) High      (RETIRED) Patient Fall Risk Level High fall risk      Patient at Risk for Falls Due to History of  fall(s);Impaired balance/gait;Impaired mobility;Impaired vision History of fall(s);Impaired balance/gait;Impaired mobility;Impaired vision History of fall(s);Impaired balance/gait;Impaired mobility Orthopedic patient   Fall risk Follow up Falls evaluation completed Falls evaluation completed Falls evaluation completed Falls prevention discussed Falls evaluation completed   Functional Status Survey:    Vitals:   11/10/22 1055  BP: 129/80  Pulse: 73  Resp: 20  Temp: 97.9 F (36.6 C)  SpO2: 98%  Weight: 159 lb 9.6 oz (72.4 kg)  Height: 5\' 7"  (1.702 m)   Body mass index is 25 kg/m. Physical Exam Vitals and nursing note reviewed.  Constitutional:      Appearance: Normal appearance.  HENT:     Head: Normocephalic.     Comments:      Nose: Nose normal.     Mouth/Throat:     Mouth: Mucous membranes are moist.  Eyes:     Extraocular Movements: Extraocular movements intact.     Conjunctiva/sclera: Conjunctivae normal.     Pupils: Pupils are equal, round, and reactive to light.  Cardiovascular:     Rate and Rhythm: Normal rate and regular rhythm.     Heart sounds: No murmur heard. Pulmonary:     Effort: Pulmonary effort is normal.     Breath sounds: Normal breath sounds. No rales.  Abdominal:     General: Bowel sounds are normal.     Palpations: Abdomen is soft.     Tenderness: There is no abdominal tenderness.  Musculoskeletal:     Cervical back: Normal range of motion and neck supple.     Right lower leg: No edema.     Left lower leg: No edema.     Comments: Right elbow in a hinged ROM brace, able to make a fist, denied pain or numbness. Chronic neck aches.  Skin:    General: Skin is warm and dry.  Findings: No bruising.     Comments: Better since bedrest between meals of previous c/o right buttock/perineal area pain, no skin breakdown or rash seen, but mild diffused redness, blanchable,  from moist and sitting in power w/c noted.    Neurological:     General: No focal  deficit present.     Mental Status: He is alert and oriented to person, place, and time. Mental status is at baseline.     Gait: Gait abnormal.  Psychiatric:        Mood and Affect: Mood normal.        Behavior: Behavior normal.        Thought Content: Thought content normal.      Labs reviewed: Recent Labs    03/23/22 2255 03/30/22 0000 04/20/22 0000 05/11/22 0800  NA 132* 131* 133* 134*  K 4.1 4.4 4.0 4.0  CL 101 98* 99 100  CO2 23 26* 26* 27*  GLUCOSE 148*  --   --   --   BUN 16 13 7 10   CREATININE 0.77 0.5* 0.5* 0.6  CALCIUM 8.5* 8.5* 9.0 8.8   No results for input(s): "AST", "ALT", "ALKPHOS", "BILITOT", "PROT", "ALBUMIN" in the last 8760 hours. Recent Labs    03/23/22 2255 03/30/22 0000  WBC 5.0 7.2  NEUTROABS 3.3  --   HGB 12.8* 12.7*  HCT 38.5* 37*  MCV 94.1  --   PLT 216 356   Lab Results  Component Value Date   TSH 2.039 11/02/2021   Lab Results  Component Value Date   HGBA1C 5.3 05/12/2022   Lab Results  Component Value Date   CHOL 93 10/14/2021   HDL 45.60 10/14/2021   LDLCALC 33 10/14/2021   LDLDIRECT 72.0 09/27/2017   TRIG 71.0 10/14/2021   CHOLHDL 2 10/14/2021    Significant Diagnostic Results in last 30 days:  No results found.  Assessment/Plan Neuropathy, peripheral W/c for mobility, mechanical lift for transfer, taking Gabapentin, update CBC/diff, CMP/eGFR, TSH, lipids, Vit B12, Vit D, Hgb a1c  Essential hypertension Blood pressure is controlled,  on Metoprolol  Hyperlipidemia  on Atorvastatin  Hyponatremia Na 134 05/11/22  Hypokalemia on Kcl K 4.0 05/11/22  GERD  thing liquid, hx of dilatation,  takes Pantoprazole, Hgb 12.7 03/30/22  Paroxysmal atrial fibrillation (HCC) Heart rate is in control,  off Eliquis 08/09/22 per cardiology, on  Metoprolol, Plavix, ASA  Diastolic dysfunction  compensated  History of pulmonary embolism   PE 11/02/21, had CTA 05/12/22, 09/08/22 Cardiology dc Eliquis, start ASA 81mg  qd, continue  Plavix 75mg  qd. Repeated CT @ 6 mos,  negative PE  CAD S/P percutaneous coronary angioplasty  ST elevation MI LAD 05/2021, underwent Cath, stenting, s/p percutaneous coronary angioplasty, started DAPT, Echo EF 40-45%,  65-70% 11/02/21, started Plavix 12/24/21 per [facility]  Osteoarthritis 03/24/23 right elbow fx(distal end of right humerus), mild swelling fingers, pain is well controlled, in hinged elbow ROM brace, chronic neck pain as well.      Family/ staff Communication: plan of care reviewed with the patient and charge nurse.   Labs/tests ordered:  CBC/diff, CMP/eGFR, TSH, lipids, Vit B12, Vit D, Hgb A1c  Time spend 35 minutes.

## 2022-11-10 NOTE — Assessment & Plan Note (Signed)
PE 11/02/21, had CTA 05/12/22, 09/08/22 Cardiology dc Eliquis, start ASA 81mg qd, continue Plavix 75mg qd. Repeated CT @ 6 mos,  negative PE 

## 2022-11-10 NOTE — Assessment & Plan Note (Signed)
03/24/23 right elbow fx(distal end of right humerus), mild swelling fingers, pain is well controlled, in hinged elbow ROM brace, chronic neck pain as well.

## 2022-11-10 NOTE — Assessment & Plan Note (Signed)
thing liquid, hx of dilatation,  takes Pantoprazole, Hgb 12.7 03/30/22 

## 2022-11-10 NOTE — Assessment & Plan Note (Signed)
 compensated 

## 2022-11-10 NOTE — Assessment & Plan Note (Signed)
W/c for mobility, mechanical lift for transfer, taking Gabapentin, update CBC/diff, CMP/eGFR, TSH, lipids, Vit B12, Vit D, Hgb a1c

## 2022-11-10 NOTE — Assessment & Plan Note (Signed)
 on Atorvastatin 

## 2022-11-10 NOTE — Assessment & Plan Note (Signed)
 on Kcl K 4.0 05/11/22 

## 2022-11-10 NOTE — Assessment & Plan Note (Signed)
Heart rate is in control,  off Eliquis 08/09/22 per cardiology, on  Metoprolol, Plavix, ASA

## 2022-11-10 NOTE — Assessment & Plan Note (Signed)
 Na 134 05/11/22 

## 2022-11-11 DIAGNOSIS — Z1321 Encounter for screening for nutritional disorder: Secondary | ICD-10-CM | POA: Diagnosis not present

## 2022-11-11 DIAGNOSIS — Z131 Encounter for screening for diabetes mellitus: Secondary | ICD-10-CM | POA: Diagnosis not present

## 2022-11-11 DIAGNOSIS — I10 Essential (primary) hypertension: Secondary | ICD-10-CM | POA: Diagnosis not present

## 2022-11-11 DIAGNOSIS — E559 Vitamin D deficiency, unspecified: Secondary | ICD-10-CM | POA: Diagnosis not present

## 2022-11-12 LAB — COMPREHENSIVE METABOLIC PANEL
Albumin: 3.2 — AB (ref 3.5–5.0)
Calcium: 8.6 — AB (ref 8.7–10.7)
Globulin: 2.1

## 2022-11-12 LAB — LIPID PANEL
Cholesterol: 79 (ref 0–200)
HDL: 33 — AB (ref 35–70)
LDL Cholesterol: 30
Triglycerides: 80 (ref 40–160)

## 2022-11-12 LAB — CBC: RBC: 4.35 (ref 3.87–5.11)

## 2022-11-12 LAB — HEPATIC FUNCTION PANEL
ALT: 17 U/L (ref 10–40)
AST: 21 (ref 14–40)
Alkaline Phosphatase: 134 — AB (ref 25–125)
Bilirubin, Total: 0.9

## 2022-11-12 LAB — BASIC METABOLIC PANEL
BUN: 13 (ref 4–21)
CO2: 24 — AB (ref 13–22)
Chloride: 103 (ref 99–108)
Creatinine: 0.5 — AB (ref 0.6–1.3)
Glucose: 98
Potassium: 3.8 mEq/L (ref 3.5–5.1)
Sodium: 134 — AB (ref 137–147)

## 2022-11-12 LAB — CBC AND DIFFERENTIAL
HCT: 39 — AB (ref 41–53)
Hemoglobin: 12.9 — AB (ref 13.5–17.5)
Platelets: 215 10*3/uL (ref 150–400)
WBC: 7.4

## 2022-11-12 LAB — HEMOGLOBIN A1C: Hemoglobin A1C: 5.6

## 2022-11-12 LAB — VITAMIN D 25 HYDROXY (VIT D DEFICIENCY, FRACTURES): Vit D, 25-Hydroxy: 36

## 2022-11-12 LAB — VITAMIN B12: Vitamin B-12: 417

## 2022-11-15 ENCOUNTER — Non-Acute Institutional Stay (SKILLED_NURSING_FACILITY): Payer: Medicare Other | Admitting: Orthopedic Surgery

## 2022-11-15 ENCOUNTER — Encounter: Payer: Self-pay | Admitting: Orthopedic Surgery

## 2022-11-15 DIAGNOSIS — L89152 Pressure ulcer of sacral region, stage 2: Secondary | ICD-10-CM

## 2022-11-15 DIAGNOSIS — R239 Unspecified skin changes: Secondary | ICD-10-CM

## 2022-11-15 NOTE — Progress Notes (Signed)
Location:   [facility] Room Number: 65-A Place of Service:  SNF (31) Provider:  Hazle Nordmann, NP  PCP: Mast, Man X, NP  Patient Care Team: Mast, Man X, NP as PCP - General (Internal Medicine) Lennette Bihari, MD as PCP - Cardiology (Cardiology) Ernesto Rutherford, MD as Consulting Physician (Ophthalmology) Luciana Axe Alford Highland, MD as Consulting Physician (Ophthalmology)  Extended Emergency Contact Information Primary Emergency Contact: Elspeth Cho [place] Home Phone: [phone] Mobile Phone: [phone] Relation: Daughter Secondary Emergency Contact: Doree Albee [place] Home Phone: [phone] Relation: Daughter  Code Status:  FULL CODE Goals of care: Advanced Directive information    11/15/2022    2:20 PM  Advanced Directives  Does Patient Have a Medical Advance Directive? Yes  Type of Estate agent of [place];Living will  Does patient want to make changes to medical advance directive? No - Patient declined  Copy of Healthcare Power of Attorney in Chart? Yes - validated most recent copy scanned in chart (See row information)     Chief Complaint  Patient presents with   Acute Visit    Sacral pressure ulcer.    HPI:  Pt is a 89 y.o. male seen today for an acute visit for    Past Medical History:  Diagnosis Date   Arthritis    Diverticulosis of colon    External thrombosed hemorrhoids 06/11/2009   Qualifier: Diagnosis of  By: Clent Ridges MD, Tera Mater    GERD (gastroesophageal reflux disease)    Hypertension    Past Surgical History:  Procedure Laterality Date   cataract extaction, right  6/09   per Dr. Kennedy Bucker   CATARACT EXTRACTION Left    COLONOSCOPY  09/23/04   per Dr. Sherin Quarry, clear, no repeats needed    CORONARY STENT INTERVENTION N/A 06/06/2021   Procedure: CORONARY STENT INTERVENTION;  Surgeon: Yvonne Kendall, MD;  [facility]: [facility];  Service: Cardiovascular;   Laterality: N/A;   CORONARY/GRAFT ACUTE MI REVASCULARIZATION N/A 06/06/2021   Procedure: Coronary/Graft Acute MI Revascularization;  Surgeon: Yvonne Kendall, MD;  [facility]: [facility];  Service: Cardiovascular;  Laterality: N/A;   EGD and esophageal diatation,  05-10-11   per Dr. Christella Hartigan     HERNIA REPAIR     LEFT HEART CATH AND CORONARY ANGIOGRAPHY N/A 06/06/2021   Procedure: LEFT HEART CATH AND CORONARY ANGIOGRAPHY;  Surgeon: Yvonne Kendall, MD;  Location: [facility];  Service: Cardiovascular;  Laterality: N/A;   rt inguinal hernia     x2    Allergies  Allergen Reactions   Contrast Media [Iodinated Contrast Media] Rash    Allergies as of 11/15/2022       Reactions   Contrast Media [iodinated Contrast Media] Rash        Medication List        Accurate as of November 15, 2022  2:20 PM. If you have any questions, ask your nurse or doctor.          acetaminophen 325 MG tablet Commonly known as: TYLENOL Take 650 mg by mouth every 6 (six) hours as needed.   aspirin EC 81 MG tablet Take 1 tablet (81 mg total) by mouth daily. Swallow whole.   atorvastatin 40 MG tablet Commonly known as: LIPITOR Take 1 tablet (40 mg total) by mouth daily.   Biofreeze Roll-On 4 % Gel Generic drug: Menthol (Topical Analgesic) Apply topically. Apply to neck topically two times a day  clopidogrel 75 MG tablet Commonly known as: PLAVIX Take 75 mg by mouth daily.   docusate sodium 100 MG capsule Commonly known as: COLACE Take 100 mg by mouth 2 (two) times daily.   gabapentin 300 MG capsule Commonly known as: NEURONTIN TAKE 1 CAPSULE BY MOUTH THREE TIMES A DAY   hydrocortisone cream 1 % Apply 1 Application topically 2 (two) times daily. Apply to right arm topically two times a day related to PAIN IN RIGHT ELBOW   metoprolol succinate 25 MG 24 hr tablet Commonly known as: TOPROL-XL Take 12.5 mg by mouth daily.   nitroGLYCERIN 0.4 MG SL tablet Commonly known as:  Nitrostat Place 1 tablet (0.4 mg total) under the tongue every 5 (five) minutes as needed for chest pain.   pantoprazole 40 MG tablet Commonly known as: PROTONIX Take 1 tablet (40 mg total) by mouth daily.   potassium chloride 10 MEQ tablet Commonly known as: Klor-Con 10 Take 1 tablet (10 mEq total) by mouth daily.   Pro-Stat Liqd Take 30 mLs by mouth daily.        Review of Systems  Immunization History  Administered Date(s) Administered   Covid-19, Mrna,Vaccine(Spikevax)5yrs and older 03/22/2022   Fluad Quad(high Dose 65+) 02/15/2022   Influenza Split 01/20/2012, 01/07/2013   Influenza Whole 02/09/2006, 01/11/2008   Influenza, High Dose Seasonal PF 12/30/2016, 12/27/2017, 01/08/2019   Influenza,inj,quad, With Preservative 12/29/2018   Influenza-Unspecified 01/17/2014, 01/14/2016, 12/30/2016   PFIZER Comirnaty(Gray Top)Covid-19 Tri-Sucrose Vaccine 05/28/2019, 03/04/2020, 09/23/2020   Pfizer Covid-19 Vaccine Bivalent Booster 53yrs & up 02/25/2022   Pneumococcal Conjugate-13 09/23/2015   Pneumococcal Polysaccharide-23 03/13/2008   Td 12/20/2007   Tdap 07/06/2021   Unspecified SARS-COV-2 Vaccination 04/30/2019   Zoster Recombinant(Shingrix) 03/11/2020, 06/20/2020   Pertinent  Health Maintenance Due  Topic Date Due   INFLUENZA VACCINE  11/25/2022   FOOT EXAM  03/09/2023   HEMOGLOBIN A1C  05/15/2023   OPHTHALMOLOGY EXAM  Discontinued      05/04/2022    2:44 PM 06/01/2022   11:04 AM 06/15/2022    9:14 AM 06/23/2022    3:12 PM 10/19/2022   11:19 AM  Fall Risk  Falls in the past year? 1 1 1 1  Exclusion - non ambulatory  Was there an injury with Fall? 1 1 1 1    Was there an injury with Fall? - Comments    Fx Rt Ekbow. Followed by Orthopedic   Fall Risk Category Calculator 3 3 3 2    Fall Risk Category (Retired) High      (RETIRED) Patient Fall Risk Level High fall risk      Patient at Risk for Falls Due to History of fall(s);Impaired balance/gait;Impaired mobility;Impaired  vision History of fall(s);Impaired balance/gait;Impaired mobility;Impaired vision History of fall(s);Impaired balance/gait;Impaired mobility Orthopedic patient   Fall risk Follow up Falls evaluation completed Falls evaluation completed Falls evaluation completed Falls prevention discussed Falls evaluation completed   Functional Status Survey:    Vitals:   11/15/22 1411  BP: 129/80  Pulse: 73  Resp: 20  Temp: 97.9 F (36.6 C)  SpO2: 98%  Weight: 159 lb 9.6 oz (72.4 kg)  Height: 5\' 7"  (1.702 m)   Body mass index is 25 kg/m. Physical Exam  Labs reviewed: Recent Labs    03/23/22 2255 03/30/22 0000 04/20/22 0000 05/11/22 0800 11/12/22 0000  NA 132*   < > 133* 134* 134*  K 4.1   < > 4.0 4.0 3.8  CL 101   < > 99 100 103  CO2  23   < > 26* 27* 24*  GLUCOSE 148*  --   --   --   --   BUN 16   < > 7 10 13   CREATININE 0.77   < > 0.5* 0.6 0.5*  CALCIUM 8.5*   < > 9.0 8.8 8.6*   < > = values in this interval not displayed.   Recent Labs    11/12/22 0000  AST 21  ALT 17  ALKPHOS 134*  ALBUMIN 3.2*   Recent Labs    03/23/22 2255 03/30/22 0000 11/12/22 0000  WBC 5.0 7.2 7.4  NEUTROABS 3.3  --   --   HGB 12.8* 12.7* 12.9*  HCT 38.5* 37* 39*  MCV 94.1  --   --   PLT 216 356 215   Lab Results  Component Value Date   TSH 2.039 11/02/2021   Lab Results  Component Value Date   HGBA1C 5.6 11/12/2022   Lab Results  Component Value Date   CHOL 79 11/12/2022   HDL 33 (A) 11/12/2022   LDLCALC 30 11/12/2022   LDLDIRECT 72.0 09/27/2017   TRIG 80 11/12/2022   CHOLHDL 2 10/14/2021    Significant Diagnostic Results in last 30 days:  No results found.  Assessment/Plan There are no diagnoses linked to this encounter.   Family/ staff Communication:   Labs/tests ordered:

## 2022-11-18 DIAGNOSIS — R29898 Other symptoms and signs involving the musculoskeletal system: Secondary | ICD-10-CM | POA: Diagnosis not present

## 2022-11-26 ENCOUNTER — Encounter: Payer: Self-pay | Admitting: Nurse Practitioner

## 2022-11-26 ENCOUNTER — Non-Acute Institutional Stay (SKILLED_NURSING_FACILITY): Payer: Medicare Other | Admitting: Nurse Practitioner

## 2022-11-26 DIAGNOSIS — I480 Paroxysmal atrial fibrillation: Secondary | ICD-10-CM

## 2022-11-26 DIAGNOSIS — Z9861 Coronary angioplasty status: Secondary | ICD-10-CM

## 2022-11-26 DIAGNOSIS — R238 Other skin changes: Secondary | ICD-10-CM

## 2022-11-26 DIAGNOSIS — I2510 Atherosclerotic heart disease of native coronary artery without angina pectoris: Secondary | ICD-10-CM | POA: Diagnosis not present

## 2022-11-26 DIAGNOSIS — K219 Gastro-esophageal reflux disease without esophagitis: Secondary | ICD-10-CM

## 2022-11-26 DIAGNOSIS — I10 Essential (primary) hypertension: Secondary | ICD-10-CM

## 2022-11-26 DIAGNOSIS — G609 Hereditary and idiopathic neuropathy, unspecified: Secondary | ICD-10-CM

## 2022-11-26 DIAGNOSIS — Z86711 Personal history of pulmonary embolism: Secondary | ICD-10-CM

## 2022-11-26 NOTE — Assessment & Plan Note (Signed)
Chronic pressure areas in sacral/buttocks region, on and off, pressure reduction with frequent bed rest through out day helped.

## 2022-11-26 NOTE — Assessment & Plan Note (Signed)
Lower body weakness, in powder w/c for mobility, on Gabapentin

## 2022-11-26 NOTE — Progress Notes (Unsigned)
Location:  [facility] Room Number: [phone] Place of Service:  SNF (31) Provider:  ,  X, NP  Patient Care Team: ,  X, NP as PCP - General (Internal Medicine) Lennette Bihari, MD as PCP - Cardiology (Cardiology) Ernesto Rutherford, MD as Consulting Physician (Ophthalmology) Luciana Axe Alford Highland, MD as Consulting Physician (Ophthalmology)  Extended Emergency Contact Information Primary Emergency Contact: Elspeth Cho [place] Home Phone: [phone] Mobile Phone: [phone] Relation: Daughter Secondary Emergency Contact: Doree Albee [place] Home Phone: [phone] Relation: Daughter  Code Status:  Full Code Goals of care: Advanced Directive information    11/26/2022   10:26 AM  Advanced Directives  Does Patient Have a Medical Advance Directive? Yes  Type of Estate agent of [place];Living will  Does patient want to make changes to medical advance directive? No - Patient declined  Copy of Healthcare Power of Attorney in Chart? Yes - validated most recent copy scanned in chart (See row information)     Chief Complaint  Patient presents with  . Medical agement of Chronic Issues    Medical agement of Chronic Issues.     HPI:  Pt is a 89 y.o. male seen today for medical management of chronic diseases.    Chronic pressure areas in sacral/buttocks region, on and off, pressure reduction with frequent bed rest through out day helped.  03/24/23 right elbow fx(distal end of right humerus), mild swelling fingers, pain is well controlled, in hinged elbow ROM brace             CAD ST elevation MI LAD 05/2021, underwent Cath, stenting, s/p percutaneous coronary angioplasty, started DAPT, Echo EF 40-45%,  65-70% 11/02/21, started Plavix 12/24/21 per [facility]             PE 11/02/21, had CTA 05/12/22, 09/08/22 Cardiology dc Eliquis, start ASA 81mg  qd, continue Plavix 75mg  qd. Repeated CT @ 6 mos,   negative PE             Grade 2 diastolic dysfunction, compensated             Afib, off Eliquis 08/09/22 per cardiology, on  Metoprolol, Plavix, ASA             GERD, thing liquid, hx of dilatation,  takes Pantoprazole, Hgb 12.7 12.9 11/11/22             Hypokalemia, on Kcl K 3.8 11/11/22             Hyponatremia, Na 134 11/11/22             HLD on Atorvastatin             HTN, on Metoprolol             Peripheral neuropathy, on Gabapentin   Past Medical History:  Diagnosis Date  . Arthritis   . Diverticulosis of colon   . External thrombosed hemorrhoids 06/11/2009   Qualifier: Diagnosis of  By: Clent Ridges MD, Tera Mater   . GERD (gastroesophageal reflux disease)   . Hypertension    Past Surgical History:  Procedure Laterality Date  . cataract extaction, right  6/09   per Dr. Kennedy Bucker  . CATARACT EXTRACTION Left   . COLONOSCOPY  09/23/04   per Dr. Sherin Quarry, clear, no repeats needed   . CORONARY STENT INTERVENTION N/A 06/06/2021   Procedure: CORONARY STENT INTERVENTION;  Surgeon: Yvonne Kendall, MD;  [facility]: [facility];  Service: Cardiovascular;  Laterality: N/A;  . CORONARY/GRAFT ACUTE MI REVASCULARIZATION N/A 06/06/2021   Procedure: Coronary/Graft Acute MI Revascularization;  Surgeon: Yvonne Kendall, MD;  Location: [facility];  Service: Cardiovascular;  Laterality: N/A;  . EGD and esophageal diatation,  05-10-11   per Dr. Christella Hartigan    . HERNIA REPAIR    . LEFT HEART CATH AND CORONARY ANGIOGRAPHY N/A 06/06/2021   Procedure: LEFT HEART CATH AND CORONARY ANGIOGRAPHY;  Surgeon: Yvonne Kendall, MD;  [facility]: [facility];  Service: Cardiovascular;  Laterality: N/A;  . rt inguinal hernia     x2    Allergies  Allergen Reactions  . Contrast Media [Iodinated Contrast Media] Rash    Outpatient Encounter Medications as of 11/26/2022  Medication Sig  . acetaminophen (TYLENOL) 325 MG tablet Take 650 mg by mouth every 6 (six) hours as needed.  . Amino Acids-Protein Hydrolys  (PRO-STAT) LIQD Take 30 mLs by mouth daily.  Marland Kitchen aspirin EC 81 MG tablet Take 1 tablet (81 mg total) by mouth daily. Swallow whole.  Marland Kitchen atorvastatin (LIPITOR) 40 MG tablet Take 1 tablet (40 mg total) by mouth daily.  . clopidogrel (PLAVIX) 75 MG tablet Take 75 mg by mouth daily.  Marland Kitchen docusate sodium (COLACE) 100 MG capsule Take 100 mg by mouth 2 (two) times daily.  Marland Kitchen gabapentin (NEURONTIN) 300 MG capsule TAKE 1 CAPSULE BY MOUTH THREE TIMES A DAY  . hydrocortisone cream 1 % Apply 1 Application topically 2 (two) times daily. Apply to right arm topically two times a day related to PAIN IN RIGHT ELBOW  . Menthol, Topical Analgesic, (BIOFREEZE ROLL-ON) 4 % GEL Apply topically. Apply to neck topically two times a day  . metoprolol succinate (TOPROL-XL) 25 MG 24 hr tablet Take 12.5 mg by mouth daily.  . nitroGLYCERIN (NITROSTAT) 0.4 MG SL tablet Place 1 tablet (0.4 mg total) under the tongue every 5 (five) minutes as needed for chest pain.  . pantoprazole (PROTONIX) 40 MG tablet Take 1 tablet (40 mg total) by mouth daily.  . potassium chloride (KLOR-CON 10) 10 MEQ tablet Take 1 tablet (10 mEq total) by mouth daily.   No facility-administered encounter medications on file as of 11/26/2022.    Review of Systems  Constitutional:  Negative for appetite change, fatigue and fever.  HENT:  Negative for congestion and trouble swallowing.   Eyes:  Negative for visual disturbance.  Respiratory:  Negative for cough and shortness of breath.   Cardiovascular:  Negative for leg swelling.  Gastrointestinal:  Negative for abdominal pain and constipation.  Genitourinary:  Negative for dysuria, frequency and urgency.       Incontinent of urine  Musculoskeletal:  Positive for arthralgias and gait problem. Negative for joint swelling.  Skin:  Negative for color change and rash.       Sacral/buttocks region pressure, moist skin irritation   Neurological:  Negative for speech difficulty, weakness and headaches.   Psychiatric/Behavioral:  Negative for confusion and sleep disturbance. The patient is not nervous/anxious.     Immunization History  Administered Date(s) Administered  . Covid-19, Mrna,Vaccine(Spikevax)3yrs and older 03/22/2022  . Fluad Quad(high Dose 65+) 02/15/2022  . Influenza Split 01/20/2012, 01/07/2013  . Influenza Whole 02/09/2006, 01/11/2008  . Influenza, High Dose Seasonal PF 12/30/2016, 12/27/2017, 01/08/2019  . Influenza,inj,quad, With Preservative 12/29/2018  . Influenza-Unspecified 01/17/2014, 01/14/2016, 12/30/2016  . PFIZER Comirnaty(Gray Top)Covid-19 Tri-Sucrose Vaccine 05/28/2019, 03/04/2020, 09/23/2020  . Research officer, trade union 68yrs & up 02/25/2022  . Pneumococcal Conjugate-13 09/23/2015  .  Pneumococcal Polysaccharide-23 03/13/2008  . Td 12/20/2007  . Tdap 07/06/2021  . Unspecified SARS-COV-2 Vaccination 04/30/2019  . Zoster Recombinant(Shingrix) 03/11/2020, 06/20/2020   Pertinent  Health Maintenance Due  Topic Date Due  . INFLUENZA VACCINE  11/25/2022  . FOOT EXAM  03/09/2023  . HEMOGLOBIN A1C  05/15/2023  . OPHTHALMOLOGY EXAM  Discontinued      05/04/2022    2:44 PM 06/01/2022   11:04 AM 06/15/2022    9:14 AM 06/23/2022    3:12 PM 10/19/2022   11:19 AM  Fall Risk  Falls in the past year? 1 1 1 1  Exclusion - non ambulatory  Was there an injury with Fall? 1 1 1 1    Was there an injury with Fall? - Comments    Fx Rt Ekbow. Followed by Orthopedic   Fall Risk Category Calculator 3 3 3 2    Fall Risk Category (Retired) High      (RETIRED) Patient Fall Risk Level High fall risk      Patient at Risk for Falls Due to History of fall(s);Impaired balance/gait;Impaired mobility;Impaired vision History of fall(s);Impaired balance/gait;Impaired mobility;Impaired vision History of fall(s);Impaired balance/gait;Impaired mobility Orthopedic patient   Fall risk Follow up Falls evaluation completed Falls evaluation completed Falls evaluation completed Falls  prevention discussed Falls evaluation completed   Functional Status Survey:    Vitals:   11/26/22 1021  BP: 122/82  Pulse: 71  Resp: 16  Temp: 97.8 F (36.6 C)  SpO2: 98%  Weight: 160 lb (72.6 kg)  Height: 5\' 7"  (1.702 m)   Body mass index is 25.06 kg/m. Physical Exam Vitals and nursing note reviewed.  Constitutional:      Appearance: Normal appearance.  HENT:     Head: Normocephalic.     Comments:      Nose: Nose normal.     Mouth/Throat:     Mouth: Mucous membranes are moist.  Eyes:     Extraocular Movements: Extraocular movements intact.     Conjunctiva/sclera: Conjunctivae normal.     Pupils: Pupils are equal, round, and reactive to light.  Cardiovascular:     Rate and Rhythm: Normal rate and regular rhythm.     Heart sounds: No murmur heard. Pulmonary:     Effort: Pulmonary effort is normal.     Breath sounds: Normal breath sounds. No rales.  Abdominal:     General: Bowel sounds are normal.     Palpations: Abdomen is soft.     Tenderness: There is no abdominal tenderness.  Musculoskeletal:     Cervical back: Normal range of motion and neck supple.     Right lower leg: No edema.     Left lower leg: No edema.     Comments: Right elbow in a hinged ROM brace, able to make a fist, denied pain or numbness. Chronic neck aches.  Skin:    General: Skin is warm and dry.     Comments: Better since bedrest between meals of previous c/o right buttock/perineal area pain, mild diffused redness, blanchable,  from moist and sitting in power w/c noted.    Neurological:     General: No focal deficit present.     Mental Status: He is alert and oriented to person, place, and time. Mental status is at baseline.     Gait: Gait abnormal.  Psychiatric:        Mood and Affect: Mood normal.        Behavior: Behavior normal.        Thought Content:  Thought content normal.    Labs reviewed: Recent Labs    03/23/22 2255 03/30/22 0000 04/20/22 0000 05/11/22 0800  11/12/22 0000  NA 132*   < > 133* 134* 134*  K 4.1   < > 4.0 4.0 3.8  CL 101   < > 99 100 103  CO2 23   < > 26* 27* 24*  GLUCOSE 148*  --   --   --   --   BUN 16   < > 7 10 13   CREATININE 0.77   < > 0.5* 0.6 0.5*  CALCIUM 8.5*   < > 9.0 8.8 8.6*   < > = values in this interval not displayed.   Recent Labs    11/12/22 0000  AST 21  ALT 17  ALKPHOS 134*  ALBUMIN 3.2*   Recent Labs    03/23/22 2255 03/30/22 0000 11/12/22 0000  WBC 5.0 7.2 7.4  NEUTROABS 3.3  --   --   HGB 12.8* 12.7* 12.9*  HCT 38.5* 37* 39*  MCV 94.1  --   --   PLT 216 356 215   Lab Results  Component Value Date   TSH 2.039 11/02/2021   Lab Results  Component Value Date   HGBA1C 5.6 11/12/2022   Lab Results  Component Value Date   CHOL 79 11/12/2022   HDL 33 (A) 11/12/2022   LDLCALC 30 11/12/2022   LDLDIRECT 72.0 09/27/2017   TRIG 80 11/12/2022   CHOLHDL 2 10/14/2021    Significant Diagnostic Results in last 30 days:  No results found.  Assessment/Plan Skin irritation Chronic pressure areas in sacral/buttocks region, on and off, pressure reduction with frequent bed rest through out day helped.   CAD S/P percutaneous coronary angioplasty ST elevation MI LAD 05/2021, underwent Cath, stenting, s/p percutaneous coronary angioplasty, started DAPT, Echo EF 40-45%,  65-70% 11/02/21, started Plavix 12/24/21 per [facility]  History of pulmonary embolism 11/02/21, had CTA 05/12/22, 09/08/22 Cardiology dc Eliquis, start ASA 81mg  qd, continue Plavix 75mg  qd. Repeated CT @ 6 mos,  negative PE  Paroxysmal atrial fibrillation (HCC)  off Eliquis 08/09/22 per cardiology, on  Metoprolol, Plavix, ASA  GERD  thing liquid, hx of dilatation,  takes Pantoprazole, Hgb 12.7 12.9 11/11/22  Essential hypertension Blood pressure is controlled,  on Metoprolol  Neuropathy, peripheral Lower body weakness, in powder w/c for mobility, on Gabapentin     Family/ staff Communication: plan of care reviewed with the  patient and charge nurse.   Labs/tests ordered:  none  Time spend 35 minutes.

## 2022-11-26 NOTE — Assessment & Plan Note (Signed)
thing liquid, hx of dilatation,  takes Pantoprazole, Hgb 12.7 12.9 11/11/22

## 2022-11-26 NOTE — Assessment & Plan Note (Signed)
off Eliquis 08/09/22 per cardiology, on  Metoprolol, Plavix, ASA

## 2022-11-26 NOTE — Assessment & Plan Note (Signed)
ST elevation MI LAD 05/2021, underwent Cath, stenting, s/p percutaneous coronary angioplasty, started DAPT, Echo EF 40-45%,  65-70% 11/02/21, started Plavix 12/24/21 per [facility] 

## 2022-11-26 NOTE — Assessment & Plan Note (Signed)
Blood pressure is controlled, on Metoprolol

## 2022-11-26 NOTE — Assessment & Plan Note (Signed)
11/02/21, had CTA 05/12/22, 09/08/22 Cardiology dc Eliquis, start ASA 81mg  qd, continue Plavix 75mg  qd. Repeated CT @ 6 mos,  negative PE

## 2022-12-03 ENCOUNTER — Encounter: Payer: Self-pay | Admitting: Family Medicine

## 2022-12-06 ENCOUNTER — Encounter: Payer: Self-pay | Admitting: Family Medicine

## 2022-12-14 ENCOUNTER — Non-Acute Institutional Stay (SKILLED_NURSING_FACILITY): Payer: Medicare Other | Admitting: Family Medicine

## 2022-12-14 DIAGNOSIS — L270 Generalized skin eruption due to drugs and medicaments taken internally: Secondary | ICD-10-CM

## 2022-12-14 DIAGNOSIS — T50B95A Adverse effect of other viral vaccines, initial encounter: Secondary | ICD-10-CM

## 2022-12-14 NOTE — Progress Notes (Signed)
Provider:  Jacalyn Lefevre, MD Location:      Place of Service:     PCP: Mast, Man X, NP Patient Care Team: Mast, Man X, NP as PCP - General (Internal Medicine) Lennette Bihari, MD as PCP - Cardiology (Cardiology) Ernesto Rutherford, MD as Consulting Physician (Ophthalmology) Luciana Axe Alford Highland, MD as Consulting Physician (Ophthalmology)  Extended Emergency Contact Information Primary Emergency Contact: Elspeth Cho [place] Home Phone: [phone] Mobile Phone: [phone] Relation: Daughter Secondary Emergency Contact: Doree Albee [place] Home Phone: [phone] Relation: Daughter  Code Status:  Goals of Care: Advanced Directive information    11/26/2022   10:26 AM  Advanced Directives  Does Patient Have a Medical Advance Directive? Yes  Type of Estate agent of [place];Living will  Does patient want to make changes to medical advance directive? No - Patient declined  Copy of Healthcare Power of Attorney in Chart? Yes - validated most recent copy scanned in chart (See row information)      No chief complaint on file.   HPI: Patient is a 89 y.o. male seen today at request of nursing for a lesion on his buttocks.  The lesion is erythematous and involves most of the buttocks but began as a small reddened area over the sacrum that was thought to be possibly related to pressure.  Zinc preparation has been used but the redness seems to be have greatly enlarged and involve now most of the buttocks as well as scrotum.  There is no open area such as with a wound.  There is some associated discomfort.  Patient wears a diaper.  He is continent but there is moisture involved due to the diaper and sweating  Past Medical History:  Diagnosis Date   Arthritis    Diverticulosis of colon    External thrombosed hemorrhoids 06/11/2009   Qualifier: Diagnosis of  By: Clent Ridges MD, Tera Mater    GERD (gastroesophageal reflux disease)     Hypertension    Past Surgical History:  Procedure Laterality Date   cataract extaction, right  6/09   per Dr. Kennedy Bucker   CATARACT EXTRACTION Left    COLONOSCOPY  09/23/04   per Dr. Sherin Quarry, clear, no repeats needed    CORONARY STENT INTERVENTION N/A 06/06/2021   Procedure: CORONARY STENT INTERVENTION;  Surgeon: Yvonne Kendall, MD;  [facility];  Service: Cardiovascular;  Laterality: N/A;   CORONARY/GRAFT ACUTE MI REVASCULARIZATION N/A 06/06/2021   Procedure: Coronary/Graft Acute MI Revascularization;  Surgeon: Yvonne Kendall, MD;  [facility];  Service: Cardiovascular;  Laterality: N/A;   EGD and esophageal diatation,  05-10-11   per Dr. Christella Hartigan     HERNIA REPAIR     LEFT HEART CATH AND CORONARY ANGIOGRAPHY N/A 06/06/2021   Procedure: LEFT HEART CATH AND CORONARY ANGIOGRAPHY;  Surgeon: Yvonne Kendall, MD;  [facility];  Service: Cardiovascular;  Laterality: N/A;   rt inguinal hernia     x2    reports that he has quit smoking. His smoking use included cigarettes. He has a 7.5 pack-year smoking history. He has never used smokeless tobacco. He reports that he does not drink alcohol and does not use drugs. Social History   Socioeconomic History   Marital status: Widowed    Spouse name: Not on file   Number of children: 3   Years of education: Not on file   Highest education level: Not on file  Occupational History  Occupation: retired  Tobacco Use   Smoking status: Former    Current packs/day: 15.00    Average packs/day: 15.0 packs/day for 0.5 years (7.5 ttl pk-yrs)    Types: Cigarettes   Smokeless tobacco: Never   Tobacco comments:    smoked 18 and stopped and states he stopped 30's   Vaping Use   Vaping status: Never Used  Substance and Sexual Activity   Alcohol use: No    Alcohol/week: 0.0 standard drinks of alcohol   Drug use: No   Sexual activity: Never    Birth control/protection: Abstinence  Other Topics Concern    Not on file  Social History Narrative   L[facility] in studio [facility]   Widowed since 2009, was married x 57 years.    3 daughters.    Social Determinants of Health   Financial Resource Strain: Low Risk  (06/23/2022)   Overall Financial Resource Strain (CARDIA)    Difficulty of Paying Living Expenses: Not hard at all  Food Insecurity: No Food Insecurity (06/23/2022)   Hunger Vital Sign    Worried About Running Out of Food in the Last Year: Never true    Ran Out of Food in the Last Year: Never true  Transportation Needs: No Transportation Needs (06/23/2022)   PRAPARE - Administrator, Civil Service (Medical): No    Lack of Transportation (Non-Medical): No  Physical Activity: Sufficiently Active (06/23/2022)   Exercise Vital Sign    Days of Exercise per Week: 4 days    Minutes of Exercise per Session: 60 min  Stress: No Stress Concern Present (06/23/2022)   [employer] of Occupational Health - Occupational Stress Questionnaire    Feeling of Stress : Not at all  Social Connections: Moderately Integrated (06/23/2022)   Social Connection and Isolation Panel [NHANES]    Frequency of Communication with Friends and Family: More than three times a week    Frequency of Social Gatherings with Friends and Family: More than three times a week    Attends Religious Services: More than 4 times per year    Active Member of [employer] or Organizations: Yes    Attends Banker Meetings: More than 4 times per year    Marital Status: Widowed  Intimate Partner Violence: Not At Risk (06/23/2022)   Humiliation, Afraid, Rape, and Kick questionnaire    Fear of Current or Ex-Partner: No    Emotionally Abused: No    Physically Abused: No    Sexually Abused: No    Functional Status Survey:    Family History  Problem Relation Age of Onset   Coronary artery disease Father    Colon cancer Neg Hx    Stomach cancer Neg Hx    Esophageal cancer Neg Hx    Pancreatic cancer Neg Hx      Health Maintenance  Topic Date Due   COVID-19 Vaccine (6 - 2023-24 season) 05/17/2022   INFLUENZA VACCINE  11/25/2022   FOOT EXAM  03/09/2023   HEMOGLOBIN A1C  05/15/2023   Medicare Annual Wellness (AWV)  06/24/2023   DTaP/Tdap/Td (3 - Td or Tdap) 07/07/2031   Pneumonia Vaccine 70+ Years old  Completed   Zoster Vaccines- Shingrix  Completed   HPV VACCINES  Aged Out   OPHTHALMOLOGY EXAM  Discontinued    Allergies  Allergen Reactions   Contrast Media [Iodinated Contrast Media] Rash    Outpatient Encounter Medications as of 12/14/2022  Medication Sig   acetaminophen (TYLENOL) 325 MG tablet  Take 650 mg by mouth every 6 (six) hours as needed.   Amino Acids-Protein Hydrolys (PRO-STAT) LIQD Take 30 mLs by mouth daily.   aspirin EC 81 MG tablet Take 1 tablet (81 mg total) by mouth daily. Swallow whole.   atorvastatin (LIPITOR) 40 MG tablet Take 1 tablet (40 mg total) by mouth daily.   clopidogrel (PLAVIX) 75 MG tablet Take 75 mg by mouth daily.   docusate sodium (COLACE) 100 MG capsule Take 100 mg by mouth 2 (two) times daily.   gabapentin (NEURONTIN) 300 MG capsule TAKE 1 CAPSULE BY MOUTH THREE TIMES A DAY   hydrocortisone cream 1 % Apply 1 Application topically 2 (two) times daily. Apply to right arm topically two times a day related to PAIN IN RIGHT ELBOW   Menthol, Topical Analgesic, (BIOFREEZE ROLL-ON) 4 % GEL Apply topically. Apply to neck topically two times a day   metoprolol succinate (TOPROL-XL) 25 MG 24 hr tablet Take 12.5 mg by mouth daily.   nitroGLYCERIN (NITROSTAT) 0.4 MG SL tablet Place 1 tablet (0.4 mg total) under the tongue every 5 (five) minutes as needed for chest pain.   pantoprazole (PROTONIX) 40 MG tablet Take 1 tablet (40 mg total) by mouth daily.   potassium chloride (KLOR-CON 10) 10 MEQ tablet Take 1 tablet (10 mEq total) by mouth daily.   No facility-administered encounter medications on file as of 12/14/2022.    Review of Systems  Constitutional:  Negative.   Skin:  Positive for rash.    There were no vitals filed for this visit. There is no height or weight on file to calculate BMI. Physical Exam Vitals and nursing note reviewed.  Cardiovascular:     Rate and Rhythm: Normal rate.  Pulmonary:     Effort: Pulmonary effort is normal.  Skin:    Findings: Erythema present.     Comments: Well demarcated area involving most of buttocks intensely red without blanching No open or draining areas     Labs reviewed: Basic Metabolic Panel: Recent Labs    03/23/22 2255 03/30/22 0000 04/20/22 0000 05/11/22 0800 11/12/22 0000  NA 132*   < > 133* 134* 134*  K 4.1   < > 4.0 4.0 3.8  CL 101   < > 99 100 103  CO2 23   < > 26* 27* 24*  GLUCOSE 148*  --   --   --   --   BUN 16   < > 7 10 13   CREATININE 0.77   < > 0.5* 0.6 0.5*  CALCIUM 8.5*   < > 9.0 8.8 8.6*   < > = values in this interval not displayed.   Liver Function Tests: Recent Labs    11/12/22 0000  AST 21  ALT 17  ALKPHOS 134*  ALBUMIN 3.2*   No results for input(s): "LIPASE", "AMYLASE" in the last 8760 hours. No results for input(s): "AMMONIA" in the last 8760 hours. CBC: Recent Labs    03/23/22 2255 03/30/22 0000 11/12/22 0000  WBC 5.0 7.2 7.4  NEUTROABS 3.3  --   --   HGB 12.8* 12.7* 12.9*  HCT 38.5* 37* 39*  MCV 94.1  --   --   PLT 216 356 215   Cardiac Enzymes: No results for input(s): "CKTOTAL", "CKMB", "CKMBINDEX", "TROPONINI" in the last 8760 hours. BNP: Invalid input(s): "POCBNP" Lab Results  Component Value Date   HGBA1C 5.6 11/12/2022   Lab Results  Component Value Date   TSH 2.039 11/02/2021  Lab Results  Component Value Date   VITAMINB12 417 11/12/2022   No results found for: "FOLATE" Lab Results  Component Value Date   FERRITIN 256 11/03/2021    Imaging and Procedures obtained prior to SNF admission: CT Angio Chest Pulmonary Embolism (PE) W or WO Contrast  Result Date: 05/12/2022 CLINICAL DATA:  Pleural effusion,  history of blood clotting disorder, question pulmonary embolism EXAM: CT ANGIOGRAPHY CHEST WITH CONTRAST TECHNIQUE: Multidetector CT imaging of the chest was performed using the standard protocol during bolus administration of intravenous contrast. Multiplanar CT image reconstructions and MIPs were obtained to evaluate the vascular anatomy. Due to history of contrast allergy patient was premedicated for contrast administration. Patient suffered no reaction to IV contrast. RADIATION DOSE REDUCTION: This exam was performed according to the departmental dose-optimization program which includes automated exposure control, adjustment of the mA and/or kV according to patient size and/or use of iterative reconstruction technique. CONTRAST:  75mL OMNIPAQUE IOHEXOL 350 MG/ML SOLN IV COMPARISON:  11/03/2021 FINDINGS: Cardiovascular: Atherosclerotic calcifications aorta, proximal great vessels, and coronary arteries. Aorta normal caliber without aneurysm or dissection. No pericardial effusion. Minimal scattered pericardial calcification noted. Pulmonary arteries adequately opacified and patent. No evidence of pulmonary embolism. Mediastinum/Nodes: Large hiatal hernia. Esophagus unremarkable. Base of cervical region normal appearance. No thoracic adenopathy. Lungs/Pleura: Dependent atelectasis in the posterior lungs bilaterally. Lungs otherwise clear. No pulmonary infiltrate, pleural effusion, or pneumothorax. Upper Abdomen: Cholelithiasis. Simple appearing cyst medial upper RIGHT kidney 3.0 x 2.5 cm; no follow-up imaging recommended. Diverticulosis at splenic flexure of colon. Musculoskeletal: No acute osseous findings. Review of the MIP images confirms the above findings. IMPRESSION: No evidence of pulmonary embolism. Large hiatal hernia. Dependent atelectasis in the posterior lungs bilaterally. Cholelithiasis. Diverticulosis at splenic flexure of colon. Scattered atherosclerotic calcifications including coronary arteries.  Aortic Atherosclerosis (ICD10-I70.0). Electronically Signed   By: Ulyses Southward M.D.   On: 05/12/2022 15:55    Assessment/Plan 1. Erythema  Well-demarcated area of intense erythema involving buttocks and perineum.  Given location this could be related to a monilia infection.  There are some's surrounding areas of satellite lesion.  Will treat with nystatin cream and triamcinolone cream and follow    Family/ staff Communication:   Labs/tests ordered:  Bertram Millard. Hyacinth Meeker, MD [facility] [address] [place], [place] [address] Office [phone]

## 2023-01-04 DIAGNOSIS — B372 Candidiasis of skin and nail: Secondary | ICD-10-CM | POA: Diagnosis not present

## 2023-01-13 ENCOUNTER — Non-Acute Institutional Stay (SKILLED_NURSING_FACILITY): Payer: Medicare Other | Admitting: Sports Medicine

## 2023-01-13 ENCOUNTER — Encounter: Payer: Self-pay | Admitting: Sports Medicine

## 2023-01-13 DIAGNOSIS — G609 Hereditary and idiopathic neuropathy, unspecified: Secondary | ICD-10-CM | POA: Diagnosis not present

## 2023-01-13 DIAGNOSIS — R21 Rash and other nonspecific skin eruption: Secondary | ICD-10-CM | POA: Diagnosis not present

## 2023-01-13 DIAGNOSIS — I480 Paroxysmal atrial fibrillation: Secondary | ICD-10-CM | POA: Diagnosis not present

## 2023-01-13 DIAGNOSIS — I5189 Other ill-defined heart diseases: Secondary | ICD-10-CM | POA: Diagnosis not present

## 2023-01-13 DIAGNOSIS — Z9861 Coronary angioplasty status: Secondary | ICD-10-CM | POA: Diagnosis not present

## 2023-01-13 DIAGNOSIS — K219 Gastro-esophageal reflux disease without esophagitis: Secondary | ICD-10-CM

## 2023-01-13 DIAGNOSIS — I10 Essential (primary) hypertension: Secondary | ICD-10-CM | POA: Diagnosis not present

## 2023-01-13 DIAGNOSIS — E876 Hypokalemia: Secondary | ICD-10-CM | POA: Diagnosis not present

## 2023-01-13 DIAGNOSIS — I2510 Atherosclerotic heart disease of native coronary artery without angina pectoris: Secondary | ICD-10-CM | POA: Diagnosis not present

## 2023-01-13 NOTE — Progress Notes (Signed)
Location:  [facility] Room Number: 65A Place of Service:  SNF (31) Provider:  Venita Sheffield ,MD  Mast, Man X, NP  Patient Care Team: Mast, Man X, NP as PCP - General (Internal Medicine) Lennette Bihari, MD as PCP - Cardiology (Cardiology) Ernesto Rutherford, MD as Consulting Physician (Ophthalmology) Luciana Axe Alford Highland, MD as Consulting Physician (Ophthalmology)  Extended Emergency Contact Information Primary Emergency Contact: Elspeth Cho [place] Home Phone: [phone] Mobile Phone: [phone] Relation: Daughter Secondary Emergency Contact: Doree Albee [place] Home Phone: [phone] Relation: Daughter  Code Status:  Full Code  Goals of care: Advanced Directive information    01/13/2023   11:54 AM  Advanced Directives  Does Patient Have a Medical Advance Directive? Yes  Type of Estate agent of [place];Living will  Does patient want to make changes to medical advance directive? No - Patient declined  Copy of Healthcare Power of Attorney in Chart? Yes - validated most recent copy scanned in chart (See row information)     Chief Complaint  Patient presents with   Medical Management of Chronic Issues    Patient is being seen for a routine visit    Immunizations    Patient is due for Covid and Flu vaccine     HPI:  Pt is a 89 y.o. male seen today for medical management of chronic diseases.   Pt seen and examined in his room. Pt c/o rash on his bottom, he was evaluated by dermatology and is currently using compounded cream with nystatin , hydrocortisone, zinc oxide cream As per nursing staff pt does not lay on the bed and reposition, he always sits on his power scooter.  He needs mechanical lift for transferring  Denies chest pain, cough, SOB, abdominal pain, nausea, vomiting   Past Medical History:  Diagnosis Date   Arthritis    Diverticulosis of colon    External  thrombosed hemorrhoids 06/11/2009   Qualifier: Diagnosis of  By: Clent Ridges MD, Tera Mater    GERD (gastroesophageal reflux disease)    Hypertension    Past Surgical History:  Procedure Laterality Date   cataract extaction, right  6/09   per Dr. Kennedy Bucker   CATARACT EXTRACTION Left    COLONOSCOPY  09/23/04   per Dr. Sherin Quarry, clear, no repeats needed    CORONARY STENT INTERVENTION N/A 06/06/2021   Procedure: CORONARY STENT INTERVENTION;  Surgeon: Yvonne Kendall, MD;  [facility]: [facility];  Service: Cardiovascular;  Laterality: N/A;   CORONARY/GRAFT ACUTE MI REVASCULARIZATION N/A 06/06/2021   Procedure: Coronary/Graft Acute MI Revascularization;  Surgeon: Yvonne Kendall, MD;  Location: [facility];  Service: Cardiovascular;  Laterality: N/A;   EGD and esophageal diatation,  05-10-11   per Dr. Christella Hartigan     HERNIA REPAIR     LEFT HEART CATH AND CORONARY ANGIOGRAPHY N/A 06/06/2021   Procedure: LEFT HEART CATH AND CORONARY ANGIOGRAPHY;  Surgeon: Yvonne Kendall, MD;  Location: [facility];  Service: Cardiovascular;  Laterality: N/A;   rt inguinal hernia     x2    Allergies  Allergen Reactions   Contrast Media [Iodinated Contrast Media] Rash    Outpatient Encounter Medications as of 01/13/2023  Medication Sig   acetaminophen (TYLENOL) 325 MG tablet Take 650 mg by mouth every 6 (six) hours as needed.   Amino Acids-Protein Hydrolys (PRO-STAT) LIQD Take 30 mLs by mouth daily.   aspirin EC 81 MG tablet Take 1 tablet (81  mg total) by mouth daily. Swallow whole.   atorvastatin (LIPITOR) 40 MG tablet Take 1 tablet (40 mg total) by mouth daily.   clopidogrel (PLAVIX) 75 MG tablet Take 75 mg by mouth daily.   docusate sodium (COLACE) 100 MG capsule Take 100 mg by mouth 2 (two) times daily.   gabapentin (NEURONTIN) 300 MG capsule TAKE 1 CAPSULE BY MOUTH THREE TIMES A DAY   hydrocortisone cream 1 % Apply 1 Application topically 2 (two) times daily. Apply to right arm topically two times a  day related to PAIN IN RIGHT ELBOW   Menthol, Topical Analgesic, (BIOFREEZE ROLL-ON) 4 % GEL Apply topically. Apply to neck topically two times a day   metoprolol succinate (TOPROL-XL) 25 MG 24 hr tablet Take 12.5 mg by mouth daily.   nitroGLYCERIN (NITROSTAT) 0.4 MG SL tablet Place 1 tablet (0.4 mg total) under the tongue every 5 (five) minutes as needed for chest pain.   pantoprazole (PROTONIX) 40 MG tablet Take 1 tablet (40 mg total) by mouth daily.   potassium chloride (KLOR-CON 10) 10 MEQ tablet Take 1 tablet (10 mEq total) by mouth daily.   No facility-administered encounter medications on file as of 01/13/2023.    Review of Systems  Immunization History  Administered Date(s) Administered   Covid-19, Mrna,Vaccine(Spikevax)50yrs and older 03/22/2022   Fluad Quad(high Dose 65+) 02/15/2022   Influenza Split 01/20/2012, 01/07/2013   Influenza Whole 02/09/2006, 01/11/2008   Influenza, High Dose Seasonal PF 12/30/2016, 12/27/2017, 01/08/2019   Influenza,inj,quad, With Preservative 12/29/2018   Influenza-Unspecified 01/17/2014, 01/14/2016, 12/30/2016   PFIZER Comirnaty(Gray Top)Covid-19 Tri-Sucrose Vaccine 05/28/2019, 03/04/2020, 09/23/2020   Pfizer Covid-19 Vaccine Bivalent Booster 30yrs & up 02/25/2022   Pneumococcal Conjugate-13 09/23/2015   Pneumococcal Polysaccharide-23 03/13/2008   Td 12/20/2007   Tdap 07/06/2021   Unspecified SARS-COV-2 Vaccination 04/30/2019   Zoster Recombinant(Shingrix) 03/11/2020, 06/20/2020   Pertinent  Health Maintenance Due  Topic Date Due   INFLUENZA VACCINE  11/25/2022   FOOT EXAM  03/09/2023   HEMOGLOBIN A1C  05/15/2023   OPHTHALMOLOGY EXAM  Discontinued      05/04/2022    2:44 PM 06/01/2022   11:04 AM 06/15/2022    9:14 AM 06/23/2022    3:12 PM 10/19/2022   11:19 AM  Fall Risk  Falls in the past year? 1 1 1 1  Exclusion - non ambulatory  Was there an injury with Fall? 1 1 1 1    Was there an injury with Fall? - Comments    Fx Rt Ekbow. Followed  by Orthopedic   Fall Risk Category Calculator 3 3 3 2    Fall Risk Category (Retired) High      (RETIRED) Patient Fall Risk Level High fall risk      Patient at Risk for Falls Due to History of fall(s);Impaired balance/gait;Impaired mobility;Impaired vision History of fall(s);Impaired balance/gait;Impaired mobility;Impaired vision History of fall(s);Impaired balance/gait;Impaired mobility Orthopedic patient   Fall risk Follow up Falls evaluation completed Falls evaluation completed Falls evaluation completed Falls prevention discussed Falls evaluation completed   Functional Status Survey:    Vitals:   01/13/23 1151  BP: 135/66  Pulse: 68  Resp: 18  Temp: 97.6 F (36.4 C)  TempSrc: Temporal  SpO2: 98%  Weight: 158 lb 6.4 oz (71.8 kg)  Height: 5\' 7"  (1.702 m)   Body mass index is 24.81 kg/m. Physical Exam Constitutional:      Appearance: Normal appearance.  HENT:     Head: Normocephalic and atraumatic.  Cardiovascular:     Rate  and Rhythm: Normal rate and regular rhythm.     Pulses: Normal pulses.     Heart sounds: Normal heart sounds.  Pulmonary:     Effort: No respiratory distress.     Breath sounds: No stridor. No wheezing or rales.  Abdominal:     General: Bowel sounds are normal. There is no distension.     Palpations: Abdomen is soft.     Tenderness: There is no abdominal tenderness. There is no right CVA tenderness or guarding.  Neurological:     Mental Status: He is alert. Mental status is at baseline.     Labs reviewed: Recent Labs    03/23/22 2255 03/30/22 0000 04/20/22 0000 05/11/22 0800 11/12/22 0000  NA 132*   < > 133* 134* 134*  K 4.1   < > 4.0 4.0 3.8  CL 101   < > 99 100 103  CO2 23   < > 26* 27* 24*  GLUCOSE 148*  --   --   --   --   BUN 16   < > 7 10 13   CREATININE 0.77   < > 0.5* 0.6 0.5*  CALCIUM 8.5*   < > 9.0 8.8 8.6*   < > = values in this interval not displayed.   Recent Labs    11/12/22 0000  AST 21  ALT 17  ALKPHOS 134*   ALBUMIN 3.2*   Recent Labs    03/23/22 2255 03/30/22 0000 11/12/22 0000  WBC 5.0 7.2 7.4  NEUTROABS 3.3  --   --   HGB 12.8* 12.7* 12.9*  HCT 38.5* 37* 39*  MCV 94.1  --   --   PLT 216 356 215   Lab Results  Component Value Date   TSH 2.039 11/02/2021   Lab Results  Component Value Date   HGBA1C 5.6 11/12/2022   Lab Results  Component Value Date   CHOL 79 11/12/2022   HDL 33 (A) 11/12/2022   LDLCALC 30 11/12/2022   LDLDIRECT 72.0 09/27/2017   TRIG 80 11/12/2022   CHOLHDL 2 10/14/2021    Significant Diagnostic Results in last 30 days:  No results found.  Assessment/Plan   Rash -  Instructed patient to re position  Use donut pillow when using power scooter  Cont with nystatin,hydrocortisone and zinc oxide cream   CAD  HTN  Stable  Denies chest pain  Cont with asp, plavix, metoprolol, lipitor   Afib Rate controlled Not on AC as per cardiology  Cont with metoprolol   Neuropathy  Stable  Cont with gabapentin   Hypokalemia Cont with potassium supplements  Diastolic dysfunction  Euvolemic  stable  Family/ staff Communication: labs reviewed , care plan discussed with the nurse  Labs/tests ordered:  none

## 2023-01-17 ENCOUNTER — Encounter: Payer: Self-pay | Admitting: Sports Medicine

## 2023-01-19 ENCOUNTER — Telehealth: Payer: Self-pay | Admitting: Family Medicine

## 2023-01-19 DIAGNOSIS — B372 Candidiasis of skin and nail: Secondary | ICD-10-CM | POA: Diagnosis not present

## 2023-01-19 NOTE — Telephone Encounter (Signed)
Patient at [facility], has rash in diaper rash area x 68m. Was advised to see a dermatologist but the derm does not take medicaid. Says she doesn't know if he needs to come in here or get a referral to dermatologist that takes medicaid.

## 2023-01-20 MED ORDER — CLOTRIMAZOLE-BETAMETHASONE 1-0.05 % EX CREA: 1.0000 | TOPICAL_CREAM | Freq: Two times a day (BID) | CUTANEOUS | 3 refills | Status: DC | PRN

## 2023-01-20 NOTE — Telephone Encounter (Signed)
Spoke with the patient's daughter-Ms Gerre Pebbles and informed her of the message below and asked what pharmacy the Rx should be sent to.  She agreed to call [facility], call back and leave a message with pharmacy info.

## 2023-01-20 NOTE — Telephone Encounter (Signed)
Has fax number for [phone]. She says tell the team thank you

## 2023-01-20 NOTE — Addendum Note (Signed)
Addended by: Sallee Lange A on: 01/20/2023 11:30 AM   Modules accepted: Orders

## 2023-01-20 NOTE — Telephone Encounter (Signed)
We should be able to treat this ourselves. Call in Lotrisone 1-0.05% cream to apply BID as needed, 45 grams with 3 rf

## 2023-01-20 NOTE — Telephone Encounter (Signed)
Pt Rx faxed as requested, successful confirmation received

## 2023-01-20 NOTE — Telephone Encounter (Signed)
Rx printed and given to Harriett Sine for PCP to sign.

## 2023-01-25 DIAGNOSIS — R29898 Other symptoms and signs involving the musculoskeletal system: Secondary | ICD-10-CM | POA: Diagnosis not present

## 2023-01-25 DIAGNOSIS — S079XXD Crushing injury of head, part unspecified, subsequent encounter: Secondary | ICD-10-CM | POA: Diagnosis not present

## 2023-01-25 DIAGNOSIS — R1312 Dysphagia, oropharyngeal phase: Secondary | ICD-10-CM | POA: Diagnosis not present

## 2023-01-27 DIAGNOSIS — R1312 Dysphagia, oropharyngeal phase: Secondary | ICD-10-CM | POA: Diagnosis not present

## 2023-01-27 DIAGNOSIS — S079XXD Crushing injury of head, part unspecified, subsequent encounter: Secondary | ICD-10-CM | POA: Diagnosis not present

## 2023-01-27 DIAGNOSIS — R29898 Other symptoms and signs involving the musculoskeletal system: Secondary | ICD-10-CM | POA: Diagnosis not present

## 2023-02-01 DIAGNOSIS — S079XXD Crushing injury of head, part unspecified, subsequent encounter: Secondary | ICD-10-CM | POA: Diagnosis not present

## 2023-02-01 DIAGNOSIS — L309 Dermatitis, unspecified: Secondary | ICD-10-CM | POA: Diagnosis not present

## 2023-02-01 DIAGNOSIS — R1312 Dysphagia, oropharyngeal phase: Secondary | ICD-10-CM | POA: Diagnosis not present

## 2023-02-01 DIAGNOSIS — R29898 Other symptoms and signs involving the musculoskeletal system: Secondary | ICD-10-CM | POA: Diagnosis not present

## 2023-02-02 ENCOUNTER — Encounter: Payer: Self-pay | Admitting: Adult Health

## 2023-02-02 ENCOUNTER — Non-Acute Institutional Stay (SKILLED_NURSING_FACILITY): Payer: Self-pay | Admitting: Adult Health

## 2023-02-02 DIAGNOSIS — R29898 Other symptoms and signs involving the musculoskeletal system: Secondary | ICD-10-CM | POA: Diagnosis not present

## 2023-02-02 DIAGNOSIS — S079XXD Crushing injury of head, part unspecified, subsequent encounter: Secondary | ICD-10-CM | POA: Diagnosis not present

## 2023-02-02 DIAGNOSIS — R1312 Dysphagia, oropharyngeal phase: Secondary | ICD-10-CM | POA: Diagnosis not present

## 2023-02-02 DIAGNOSIS — H04123 Dry eye syndrome of bilateral lacrimal glands: Secondary | ICD-10-CM | POA: Diagnosis not present

## 2023-02-02 NOTE — Progress Notes (Signed)
Location:  [facility] Room Number: [place]-A Place of Service:  SNF (31) Provider:  Kenard Gower, DNP, FNP-BC  Patient Care Team: Nelwyn Salisbury, MD as PCP - General (Family Medicine) Lennette Bihari, MD as PCP - Cardiology (Cardiology) Ernesto Rutherford, MD as Consulting Physician (Ophthalmology) Luciana Axe Alford Highland, MD as Consulting Physician (Ophthalmology)  Extended Emergency Contact Information Primary Emergency Contact: Elspeth Cho [place] Home Phone: [phone] Mobile Phone: [phone] Relation: Daughter Secondary Emergency Contact: Doree Albee [place] Home Phone: [phone] Relation: Daughter  Code Status:  Full Code  Goals of care: Advanced Directive information    02/02/2023    1:39 PM  Advanced Directives  Does Patient Have a Medical Advance Directive? Yes  Type of Estate agent of [place];Living will  Does patient want to make changes to medical advance directive? No - Patient declined  Copy of Healthcare Power of Attorney in Chart? Yes - validated most recent copy scanned in chart (See row information)     Chief Complaint  Patient presents with   Acute Visit    Eye swollen red    HPI:  Pt is a 89 y.o. male seen today for an acute visit regarding right eye redness. He is a resident of [facility]. He complained of having right eye pain for 2 days. He stated that he can see well and there is no change in his vision. Right eye noted to have slight erythema. No noted eye discharge. He denies gritty feeling on his right eye.   Past Medical History:  Diagnosis Date   Arthritis    Diverticulosis of colon    External thrombosed hemorrhoids 06/11/2009   Qualifier: Diagnosis of  By: Clent Ridges MD, Tera Mater    GERD (gastroesophageal reflux disease)    Hypertension    Past Surgical History:  Procedure Laterality Date   cataract extaction, right  6/09   per Dr.  Kennedy Bucker   CATARACT EXTRACTION Left    COLONOSCOPY  09/23/04   per Dr. Sherin Quarry, clear, no repeats needed    CORONARY STENT INTERVENTION N/A 06/06/2021   Procedure: CORONARY STENT INTERVENTION;  Surgeon: Yvonne Kendall, MD;  Location: [facility];  Service: Cardiovascular;  Laterality: N/A;   CORONARY/GRAFT ACUTE MI REVASCULARIZATION N/A 06/06/2021   Procedure: Coronary/Graft Acute MI Revascularization;  Surgeon: Yvonne Kendall, MD;  Location: [facility];  Service: Cardiovascular;  Laterality: N/A;   EGD and esophageal diatation,  05-10-11   per Dr. Christella Hartigan     HERNIA REPAIR     LEFT HEART CATH AND CORONARY ANGIOGRAPHY N/A 06/06/2021   Procedure: LEFT HEART CATH AND CORONARY ANGIOGRAPHY;  Surgeon: Yvonne Kendall, MD;  Location: [facility];  Service: Cardiovascular;  Laterality: N/A;   rt inguinal hernia     x2    Allergies  Allergen Reactions   Contrast Media [Iodinated Contrast Media] Rash    Outpatient Encounter Medications as of 02/02/2023  Medication Sig   acetaminophen (TYLENOL) 325 MG tablet Take 650 mg by mouth every 6 (six) hours as needed.   aspirin EC 81 MG tablet Take 1 tablet (81 mg total) by mouth daily. Swallow whole.   atorvastatin (LIPITOR) 40 MG tablet Take 1 tablet (40 mg total) by mouth daily.   clopidogrel (PLAVIX) 75 MG tablet Take 75 mg by mouth daily.   clotrimazole-betamethasone (LOTRISONE) cream Apply 1 Application topically 2 (two) times daily as needed. Fax#[phone]  docusate sodium (COLACE) 100 MG capsule Take 100 mg by mouth 2 (two) times daily.   gabapentin (NEURONTIN) 300 MG capsule TAKE 1 CAPSULE BY MOUTH THREE TIMES A DAY   hydrocortisone cream 1 % Apply 1 Application topically 2 (two) times daily. Apply to right arm topically two times a day related to PAIN IN RIGHT ELBOW   Menthol, Topical Analgesic, (BIOFREEZE ROLL-ON) 4 % GEL Apply topically. Apply to neck topically two times a day   metoprolol succinate (TOPROL-XL) 25 MG  24 hr tablet Take 12.5 mg by mouth daily.   nitroGLYCERIN (NITROSTAT) 0.4 MG SL tablet Place 1 tablet (0.4 mg total) under the tongue every 5 (five) minutes as needed for chest pain.   pantoprazole (PROTONIX) 40 MG tablet Take 1 tablet (40 mg total) by mouth daily.   potassium chloride (KLOR-CON 10) 10 MEQ tablet Take 1 tablet (10 mEq total) by mouth daily.   Amino Acids-Protein Hydrolys (PRO-STAT) LIQD Take 30 mLs by mouth daily. (Patient not taking: Reported on 02/02/2023)   No facility-administered encounter medications on file as of 02/02/2023.    Review of Systems  Constitutional:  Negative for activity change, appetite change and fever.  HENT:  Negative for sore throat.   Eyes:  Positive for pain and redness. Negative for discharge, itching and visual disturbance.  Cardiovascular:  Negative for chest pain and leg swelling.  Gastrointestinal:  Negative for abdominal distention, diarrhea and vomiting.  Genitourinary:  Negative for dysuria, frequency and urgency.  Skin:  Negative for color change.  Neurological:  Negative for dizziness and headaches.  Psychiatric/Behavioral:  Negative for behavioral problems and sleep disturbance. The patient is not nervous/anxious.       Immunization History  Administered Date(s) Administered   Fluad Quad(high Dose [place]+) 02/15/2022   Influenza Split 01/20/2012, 01/07/2013   Influenza Whole 02/09/2006, 01/11/2008   Influenza, High Dose Seasonal PF 12/30/2016, 12/27/2017, 01/08/2019   Influenza,inj,quad, With Preservative 12/29/2018   Influenza-Unspecified 01/17/2014, 01/14/2016, 12/30/2016   Moderna Covid-19 Fall Seasonal Vaccine 71yrs & older 03/22/2022   PFIZER Comirnaty(Gray Top)Covid-19 Tri-Sucrose Vaccine 05/28/2019, 03/04/2020, 09/23/2020   Pfizer Covid-19 Vaccine Bivalent Booster 15yrs & up 02/25/2022   Pneumococcal Conjugate-13 09/23/2015   Pneumococcal Polysaccharide-23 03/13/2008   Td 12/20/2007   Tdap 07/06/2021   Unspecified  SARS-COV-2 Vaccination 04/30/2019   Zoster Recombinant(Shingrix) 03/11/2020, 06/20/2020   Pertinent  Health Maintenance Due  Topic Date Due   INFLUENZA VACCINE  11/25/2022   FOOT EXAM  03/09/2023   HEMOGLOBIN A1C  05/15/2023   OPHTHALMOLOGY EXAM  Discontinued      05/04/2022    2:44 PM 06/01/2022   11:04 AM 06/15/2022    9:14 AM 06/23/2022    3:12 PM 10/19/2022   11:19 AM  Fall Risk  Falls in the past year? 1 1 1 1  Exclusion - non ambulatory  Was there an injury with Fall? 1 1 1 1    Was there an injury with Fall? - Comments    Fx Rt Ekbow. Followed by Orthopedic   Fall Risk Category Calculator 3 3 3 2    Fall Risk Category (Retired) High      (RETIRED) Patient Fall Risk Level High fall risk      Patient at Risk for Falls Due to History of fall(s);Impaired balance/gait;Impaired mobility;Impaired vision History of fall(s);Impaired balance/gait;Impaired mobility;Impaired vision History of fall(s);Impaired balance/gait;Impaired mobility Orthopedic patient   Fall risk Follow up Falls evaluation completed Falls evaluation completed Falls evaluation completed Falls prevention discussed Falls evaluation completed  Vitals:   02/02/23 1333  BP: 111/61  Pulse: 61  Resp: 18  Temp: 97.6 F (36.4 C)  SpO2: 98%  Weight: 158 lb 9.6 oz (71.9 kg)  Height: 5\' 7"  (1.702 m)   Body mass index is 24.84 kg/m.  Physical Exam Constitutional:      General: He is not in acute distress.    Appearance: Normal appearance.  HENT:     Head: Normocephalic and atraumatic.     Mouth/Throat:     Mouth: Mucous membranes are moist.  Eyes:     Conjunctiva/sclera: Conjunctivae normal.     Comments: Right eye noted to have slight erythema  Cardiovascular:     Rate and Rhythm: Normal rate and regular rhythm.     Pulses: Normal pulses.     Heart sounds: Normal heart sounds.  Pulmonary:     Effort: Pulmonary effort is normal.     Breath sounds: Normal breath sounds.  Abdominal:     General: Bowel sounds  are normal.     Palpations: Abdomen is soft.  Musculoskeletal:        General: No swelling.     Cervical back: Normal range of motion.  Skin:    General: Skin is warm and dry.  Neurological:     General: No focal deficit present.     Mental Status: He is alert and oriented to person, place, and time.  Psychiatric:        Mood and Affect: Mood normal.        Behavior: Behavior normal.        Thought Content: Thought content normal.        Judgment: Judgment normal.       Labs reviewed: Recent Labs    03/23/22 2255 03/30/22 0000 04/20/22 0000 05/11/22 0800 11/12/22 0000  NA 132*   < > 133* 134* 134*  K 4.1   < > 4.0 4.0 3.8  CL 101   < > 99 100 103  CO2 23   < > 26* 27* 24*  GLUCOSE 148*  --   --   --   --   BUN 16   < > 7 10 13   CREATININE 0.77   < > 0.5* 0.6 0.5*  CALCIUM 8.5*   < > 9.0 8.8 8.6*   < > = values in this interval not displayed.   Recent Labs    11/12/22 0000  AST 21  ALT 17  ALKPHOS 134*  ALBUMIN 3.2*   Recent Labs    03/23/22 2255 03/30/22 0000 11/12/22 0000  WBC 5.0 7.2 7.4  NEUTROABS 3.3  --   --   HGB 12.8* 12.7* 12.9*  HCT 38.5* 37* 39*  MCV 94.1  --   --   PLT 216 356 215   Lab Results  Component Value Date   TSH 2.039 11/02/2021   Lab Results  Component Value Date   HGBA1C 5.6 11/12/2022   Lab Results  Component Value Date   CHOL 79 11/12/2022   HDL 33 (A) 11/12/2022   LDLCALC 30 11/12/2022   LDLDIRECT 72.0 09/27/2017   TRIG 80 11/12/2022   CHOLHDL 2 10/14/2021    Significant Diagnostic Results in last 30 days:  No results found.  Assessment/Plan  1. Dry eyes -  will start on artificial tears 2 drops to right eye BID -  eye care daily -  monitor for discharge    Family/ staff Communication: Discussed plan of care with resident and  charge nurse.  Labs/tests ordered: None    Kenard Gower, DNP, MSN, FNP-BC [facility] [phone] (Monday-Friday 8:00 a.m. - 5:00  p.m.) [phone] (after hours)

## 2023-02-03 DIAGNOSIS — S079XXD Crushing injury of head, part unspecified, subsequent encounter: Secondary | ICD-10-CM | POA: Diagnosis not present

## 2023-02-03 DIAGNOSIS — R29898 Other symptoms and signs involving the musculoskeletal system: Secondary | ICD-10-CM | POA: Diagnosis not present

## 2023-02-03 DIAGNOSIS — R1312 Dysphagia, oropharyngeal phase: Secondary | ICD-10-CM | POA: Diagnosis not present

## 2023-02-04 ENCOUNTER — Encounter: Payer: Self-pay | Admitting: Sports Medicine

## 2023-02-04 ENCOUNTER — Non-Acute Institutional Stay (SKILLED_NURSING_FACILITY): Payer: Medicare Other | Admitting: Sports Medicine

## 2023-02-04 DIAGNOSIS — H00012 Hordeolum externum right lower eyelid: Secondary | ICD-10-CM | POA: Diagnosis not present

## 2023-02-04 DIAGNOSIS — L89301 Pressure ulcer of unspecified buttock, stage 1: Secondary | ICD-10-CM

## 2023-02-04 DIAGNOSIS — S079XXD Crushing injury of head, part unspecified, subsequent encounter: Secondary | ICD-10-CM | POA: Diagnosis not present

## 2023-02-04 DIAGNOSIS — R29898 Other symptoms and signs involving the musculoskeletal system: Secondary | ICD-10-CM | POA: Diagnosis not present

## 2023-02-04 DIAGNOSIS — R21 Rash and other nonspecific skin eruption: Secondary | ICD-10-CM

## 2023-02-04 DIAGNOSIS — R1312 Dysphagia, oropharyngeal phase: Secondary | ICD-10-CM | POA: Diagnosis not present

## 2023-02-04 NOTE — Progress Notes (Signed)
Location:   [facility] Room Number: 65-A Place of Service:  SNF [id number]) Provider:  Oletta Lamas   PCP: Nelwyn Salisbury, MD  Patient Care Team: Nelwyn Salisbury, MD as PCP - General (Family Medicine) Lennette Bihari, MD as PCP - Cardiology (Cardiology) Ernesto Rutherford, MD as Consulting Physician (Ophthalmology) Luciana Axe Alford Highland, MD as Consulting Physician (Ophthalmology)  Extended Emergency Contact Information Primary Emergency Contact: Elspeth Cho [place] Home Phone: [phone] Mobile Phone: [phone] Relation: Daughter Secondary Emergency Contact: Doree Albee [place] Home Phone: [phone] Relation: Daughter  Code Status:  FULL CODE Goals of care: Advanced Directive information    02/04/2023    9:25 AM  Advanced Directives  Does Patient Have a Medical Advance Directive? Yes  Type of Estate agent of [place];Living will  Does patient want to make changes to medical advance directive? No - Patient declined  Copy of Healthcare Power of Attorney in Chart? Yes - validated most recent copy scanned in chart (See row information)     Chief Complaint  Patient presents with   Acute Visit    Rash     HPI:  Pt is a 89 y.o. male seen today for an acute visit for  rash on his back and buttocks. Pt has long standing history of rash on his buttocks. He is being followed by dermatology and they recommended compounded cream with hydrocortisone, zinc oxide, nystatin. He uses it regularly.  He c/o itching on his back. He is using cerave cream ad OTC anti itching cream.  Pt is wheel chair dependent and needs help with transferring. He sits most of the day in his power scooter and only at night he lays on his bed.   Stye Rt eye lower lid- has stye on his Rt lower eye lid, minimal erythema, denies pain or drainage.     Past Medical History:  Diagnosis Date   Arthritis    Diverticulosis  of colon    External thrombosed hemorrhoids 06/11/2009   Qualifier: Diagnosis of  By: Clent Ridges MD, Tera Mater    GERD (gastroesophageal reflux disease)    Hypertension    Past Surgical History:  Procedure Laterality Date   cataract extaction, right  6/09   per Dr. Kennedy Bucker   CATARACT EXTRACTION Left    COLONOSCOPY  09/23/04   per Dr. Sherin Quarry, clear, no repeats needed    CORONARY STENT INTERVENTION N/A 06/06/2021   Procedure: CORONARY STENT INTERVENTION;  Surgeon: Yvonne Kendall, MD;  [facility]: [facility];  Service: Cardiovascular;  Laterality: N/A;   CORONARY/GRAFT ACUTE MI REVASCULARIZATION N/A 06/06/2021   Procedure: Coronary/Graft Acute MI Revascularization;  Surgeon: Yvonne Kendall, MD;  Location: [facility];  Service: Cardiovascular;  Laterality: N/A;   EGD and esophageal diatation,  05-10-11   per Dr. Christella Hartigan     HERNIA REPAIR     LEFT HEART CATH AND CORONARY ANGIOGRAPHY N/A 06/06/2021   Procedure: LEFT HEART CATH AND CORONARY ANGIOGRAPHY;  Surgeon: Yvonne Kendall, MD;  Location: [facility];  Service: Cardiovascular;  Laterality: N/A;   rt inguinal hernia     x2    Allergies  Allergen Reactions   Contrast Media [Iodinated Contrast Media] Rash    Allergies as of 02/04/2023       Reactions   Contrast Media [iodinated Contrast Media] Rash        Medication List        Accurate as  of February 04, 2023  9:25 AM. If you have any questions, ask your nurse or doctor.          STOP taking these medications    Pro-Stat Liqd Stopped by: Chamia Schmutz       TAKE these medications    acetaminophen 325 MG tablet Commonly known as: TYLENOL Take 650 mg by mouth every 6 (six) hours as needed.   aspirin EC 81 MG tablet Take 1 tablet (81 mg total) by mouth daily. Swallow whole.   atorvastatin 40 MG tablet Commonly known as: LIPITOR Take 1 tablet (40 mg total) by mouth daily.   Biofreeze Roll-On 4 % Gel Generic drug: Menthol (Topical  Analgesic) Apply topically. Apply to neck topically two times a day   clopidogrel 75 MG tablet Commonly known as: PLAVIX Take 75 mg by mouth daily.   clotrimazole-betamethasone cream Commonly known as: LOTRISONE Apply 1 Application topically 2 (two) times daily as needed. Fax#[phone]   docusate sodium 100 MG capsule Commonly known as: COLACE Take 100 mg by mouth 2 (two) times daily.   gabapentin 300 MG capsule Commonly known as: NEURONTIN TAKE 1 CAPSULE BY MOUTH THREE TIMES A DAY   hydrocortisone cream 1 % Apply 1 Application topically 2 (two) times daily. Apply to right arm topically two times a day related to PAIN IN RIGHT ELBOW   metoprolol succinate 25 MG 24 hr tablet Commonly known as: TOPROL-XL Take 12.5 mg by mouth daily.   nitroGLYCERIN 0.4 MG SL tablet Commonly known as: Nitrostat Place 1 tablet (0.4 mg total) under the tongue every 5 (five) minutes as needed for chest pain.   pantoprazole 40 MG tablet Commonly known as: PROTONIX Take 1 tablet (40 mg total) by mouth daily.   polyvinyl alcohol 1.4 % ophthalmic solution Commonly known as: LIQUIFILM TEARS Place 2 drops into the right eye 2 (two) times daily.   potassium chloride 10 MEQ tablet Commonly known as: Klor-Con 10 Take 1 tablet (10 mEq total) by mouth daily.        Review of Systems  Constitutional:  Negative for chills and fever.  HENT:  Negative for sinus pressure and sore throat.   Eyes:  Positive for redness (stye on Rt lower eye lid).  Respiratory:  Negative for cough, shortness of breath and wheezing.   Cardiovascular:  Negative for chest pain, palpitations and leg swelling.  Gastrointestinal:  Negative for abdominal distention, abdominal pain, blood in stool, constipation, diarrhea, nausea and vomiting.  Genitourinary:  Negative for dysuria, frequency and urgency.  Skin:  Positive for rash.  Neurological:  Negative for dizziness, weakness and numbness.  Psychiatric/Behavioral:   Negative for confusion.     Immunization History  Administered Date(s) Administered   Fluad Quad(high Dose 65+) 02/15/2022   Influenza Split 01/20/2012, 01/07/2013   Influenza Whole 02/09/2006, 01/11/2008   Influenza, High Dose Seasonal PF 12/30/2016, 12/27/2017, 01/08/2019   Influenza,inj,quad, With Preservative 12/29/2018   Influenza-Unspecified 01/17/2014, 01/14/2016, 12/30/2016   Moderna Covid-19 Fall Seasonal Vaccine 66yrs & older 03/22/2022   PFIZER Comirnaty(Gray Top)Covid-19 Tri-Sucrose Vaccine 05/28/2019, 03/04/2020, 09/23/2020   Pfizer Covid-19 Vaccine Bivalent Booster 65yrs & up 02/25/2022   Pneumococcal Conjugate-13 09/23/2015   Pneumococcal Polysaccharide-23 03/13/2008   Td 12/20/2007   Tdap 07/06/2021   Unspecified SARS-COV-2 Vaccination 04/30/2019   Zoster Recombinant(Shingrix) 03/11/2020, 06/20/2020   Pertinent  Health Maintenance Due  Topic Date Due   INFLUENZA VACCINE  11/25/2022   FOOT EXAM  03/09/2023   HEMOGLOBIN A1C  05/15/2023  OPHTHALMOLOGY EXAM  Discontinued      05/04/2022    2:44 PM 06/01/2022   11:04 AM 06/15/2022    9:14 AM 06/23/2022    3:12 PM 10/19/2022   11:19 AM  Fall Risk  Falls in the past year? 1 1 1 1  Exclusion - non ambulatory  Was there an injury with Fall? 1 1 1 1    Was there an injury with Fall? - Comments    Fx Rt Ekbow. Followed by Orthopedic   Fall Risk Category Calculator 3 3 3 2    Fall Risk Category (Retired) High      (RETIRED) Patient Fall Risk Level High fall risk      Patient at Risk for Falls Due to History of fall(s);Impaired balance/gait;Impaired mobility;Impaired vision History of fall(s);Impaired balance/gait;Impaired mobility;Impaired vision History of fall(s);Impaired balance/gait;Impaired mobility Orthopedic patient   Fall risk Follow up Falls evaluation completed Falls evaluation completed Falls evaluation completed Falls prevention discussed Falls evaluation completed   Functional Status Survey:    Vitals:    02/04/23 0921  BP: (!) 148/70  Pulse: 72  Resp: 18  Temp: 97.6 F (36.4 C)  SpO2: 98%  Weight: 158 lb 14.4 oz (72.1 kg)  Height: 5\' 7"  (1.702 m)   Body mass index is 24.89 kg/m. Physical Exam Constitutional:      Appearance: Normal appearance.  HENT:     Head: Normocephalic and atraumatic.  Eyes:     General:        Right eye: Discharge (Stye on Rt lower eye lid , no drainage .) present.  Cardiovascular:     Rate and Rhythm: Normal rate and regular rhythm.     Pulses: Normal pulses.     Heart sounds: Normal heart sounds.  Pulmonary:     Effort: No respiratory distress.     Breath sounds: No stridor. No wheezing or rales.  Abdominal:     General: Bowel sounds are normal. There is no distension.     Palpations: Abdomen is soft.     Tenderness: There is no abdominal tenderness. There is no right CVA tenderness or guarding.  Skin:    Comments: Buttocks- erythematous flat lesion all over his buttock  Back - no redness seen , pt c/o itching  Neurological:     Mental Status: He is alert. Mental status is at baseline.     Sensory: No sensory deficit.     Motor: No weakness.     Labs reviewed: Recent Labs    03/23/22 2255 03/30/22 0000 04/20/22 0000 05/11/22 0800 11/12/22 0000  NA 132*   < > 133* 134* 134*  K 4.1   < > 4.0 4.0 3.8  CL 101   < > 99 100 103  CO2 23   < > 26* 27* 24*  GLUCOSE 148*  --   --   --   --   BUN 16   < > 7 10 13   CREATININE 0.77   < > 0.5* 0.6 0.5*  CALCIUM 8.5*   < > 9.0 8.8 8.6*   < > = values in this interval not displayed.   Recent Labs    11/12/22 0000  AST 21  ALT 17  ALKPHOS 134*  ALBUMIN 3.2*   Recent Labs    03/23/22 2255 03/30/22 0000 11/12/22 0000  WBC 5.0 7.2 7.4  NEUTROABS 3.3  --   --   HGB 12.8* 12.7* 12.9*  HCT 38.5* 37* 39*  MCV 94.1  --   --  PLT 216 356 215   Lab Results  Component Value Date   TSH 2.039 11/02/2021   Lab Results  Component Value Date   HGBA1C 5.6 11/12/2022   Lab Results   Component Value Date   CHOL 79 11/12/2022   HDL 33 (A) 11/12/2022   LDLCALC 30 11/12/2022   LDLDIRECT 72.0 09/27/2017   TRIG 80 11/12/2022   CHOLHDL 2 10/14/2021    Significant Diagnostic Results in last 30 days:  No results found.  Assessment/Plan  1. Rash Pt c/o itching on his back  No redness noted  Pt states its probably the detergent that causing itching  Will start allegra  Will start mometasone cream 0.1 % , instructed nurse to mix mometasone cream with cerave and apply on his back  Will check cbc, cmp  2. Pressure injury of buttock, stage 1, unspecified laterality Pt is constantly sitting in his power scooter  He needs frequent reposition  Will cont with compounded cream  Follow up with dermatology   3. Hordeolum externum of right lower eyelid No signs of infection  Looks better from yesterday  Instructed nurse to give warm compressors 4 times daily  Monitor for drainage   Other orders - polyvinyl alcohol (LIQUIFILM TEARS) 1.4 % ophthalmic solution; Place 2 drops into the right eye 2 (two) times daily.    Family/ staff Communication: care plan discussed with the nursing staff   Labs/tests ordered:  cbc, cmp  I spent greater than 45  minutes for the care of this patient in face to face time, chart review, clinical documentation, patient education, care coordination.

## 2023-02-08 DIAGNOSIS — I10 Essential (primary) hypertension: Secondary | ICD-10-CM | POA: Diagnosis not present

## 2023-02-08 LAB — CBC AND DIFFERENTIAL
HCT: 44 (ref 41–53)
Hemoglobin: 14.7 (ref 13.5–17.5)
Platelets: 294 10*3/uL (ref 150–400)
WBC: 7.7

## 2023-02-08 LAB — HEPATIC FUNCTION PANEL
ALT: 22 U/L (ref 10–40)
AST: 24 (ref 14–40)
Alkaline Phosphatase: 177 — AB (ref 25–125)
Bilirubin, Total: 0.9

## 2023-02-08 LAB — BASIC METABOLIC PANEL
BUN: 15 (ref 4–21)
CO2: 25 — AB (ref 13–22)
Chloride: 99 (ref 99–108)
Creatinine: 0.6 (ref 0.6–1.3)
Glucose: 105
Potassium: 4.1 meq/L (ref 3.5–5.1)
Sodium: 134 — AB (ref 137–147)

## 2023-02-08 LAB — COMPREHENSIVE METABOLIC PANEL
Albumin: 3.8 (ref 3.5–5.0)
Calcium: 9.2 (ref 8.7–10.7)
Globulin: 2.7
eGFR: 88

## 2023-02-08 LAB — CBC: RBC: 5 (ref 3.87–5.11)

## 2023-02-09 ENCOUNTER — Encounter: Payer: Self-pay | Admitting: Adult Health

## 2023-02-09 ENCOUNTER — Non-Acute Institutional Stay (SKILLED_NURSING_FACILITY): Payer: Medicare Other | Admitting: Adult Health

## 2023-02-09 DIAGNOSIS — K219 Gastro-esophageal reflux disease without esophagitis: Secondary | ICD-10-CM

## 2023-02-09 DIAGNOSIS — Z9861 Coronary angioplasty status: Secondary | ICD-10-CM

## 2023-02-09 DIAGNOSIS — Z23 Encounter for immunization: Secondary | ICD-10-CM | POA: Diagnosis not present

## 2023-02-09 DIAGNOSIS — S079XXD Crushing injury of head, part unspecified, subsequent encounter: Secondary | ICD-10-CM | POA: Diagnosis not present

## 2023-02-09 DIAGNOSIS — H04123 Dry eye syndrome of bilateral lacrimal glands: Secondary | ICD-10-CM

## 2023-02-09 DIAGNOSIS — I10 Essential (primary) hypertension: Secondary | ICD-10-CM

## 2023-02-09 DIAGNOSIS — R1312 Dysphagia, oropharyngeal phase: Secondary | ICD-10-CM | POA: Diagnosis not present

## 2023-02-09 DIAGNOSIS — E876 Hypokalemia: Secondary | ICD-10-CM | POA: Diagnosis not present

## 2023-02-09 DIAGNOSIS — I2510 Atherosclerotic heart disease of native coronary artery without angina pectoris: Secondary | ICD-10-CM | POA: Diagnosis not present

## 2023-02-09 DIAGNOSIS — R29898 Other symptoms and signs involving the musculoskeletal system: Secondary | ICD-10-CM | POA: Diagnosis not present

## 2023-02-09 NOTE — Progress Notes (Signed)
Location:  [facility] Room Number: 65-A Place of Service:  SNF (31) Provider:  Kenard Gower, DNP, FNP-BC  Patient Care Team: Nelwyn Salisbury, MD as PCP - General (Family Medicine) Lennette Bihari, MD as PCP - Cardiology (Cardiology) Ernesto Rutherford, MD as Consulting Physician (Ophthalmology) Luciana Axe Alford Highland, MD as Consulting Physician (Ophthalmology)  Extended Emergency Contact Information Primary Emergency Contact: Elspeth Cho [place] Home Phone: [phone] Mobile Phone: [phone] Relation: Daughter Secondary Emergency Contact: Doree Albee [place] Home Phone: [phone] Relation: Daughter  Code Status:  Full Code  Goals of care: Advanced Directive information    02/09/2023    1:36 PM  Advanced Directives  Does Patient Have a Medical Advance Directive? Yes  Type of Estate agent of [place];Living will  Does patient want to make changes to medical advance directive? No - Patient declined  Copy of Healthcare Power of Attorney in Chart? Yes - validated most recent copy scanned in chart (See row information)     Chief Complaint  Patient presents with   Medical Management of Chronic Issues    Routine Visit   Immunizations    Covid    HPI:  Pt is a 89 y.o. [name] seen today for medical management of chronic diseases. He is a resident of [facility].   CAD S/P percutaneous coronary angioplasty -  denies chest pains, takes Plavix, Atorvastatin and NTG PRN  Dry eyes -  improved, less redness , takes Artificial tears  Gastroesophageal reflux disease, unspecified whether esophagitis present -  denies heart burns, takes Pantoprazole  Essential hypertension  -  BP 126/63,  takes Metoprolol tartrate  Hypokalemia  -  K 4.1, takes KCL supplementation    Past Medical History:  Diagnosis Date   Arthritis    Diverticulosis of colon    External thrombosed  hemorrhoids 06/11/2009   Qualifier: Diagnosis of  By: Clent Ridges MD, Tera Mater    GERD (gastroesophageal reflux disease)    Hypertension    Past Surgical History:  Procedure Laterality Date   cataract extaction, right  6/09   per Dr. Kennedy Bucker   CATARACT EXTRACTION Left    COLONOSCOPY  09/23/04   per Dr. Sherin Quarry, clear, no repeats needed    CORONARY STENT INTERVENTION N/A 06/06/2021   Procedure: CORONARY STENT INTERVENTION;  Surgeon: Yvonne Kendall, MD;  Location: [facility];  Service: Cardiovascular;  Laterality: N/A;   CORONARY/GRAFT ACUTE MI REVASCULARIZATION N/A 06/06/2021   Procedure: Coronary/Graft Acute MI Revascularization;  Surgeon: Yvonne Kendall, MD;  Location: [facility];  Service: Cardiovascular;  Laterality: N/A;   EGD and esophageal diatation,  05-10-11   per Dr. Christella Hartigan     HERNIA REPAIR     LEFT HEART CATH AND CORONARY ANGIOGRAPHY N/A 06/06/2021   Procedure: LEFT HEART CATH AND CORONARY ANGIOGRAPHY;  Surgeon: Yvonne Kendall, MD;  [facility]: [facility];  Service: Cardiovascular;  Laterality: N/A;   rt inguinal hernia     x2    Allergies  Allergen Reactions   Contrast Media [Iodinated Contrast Media] Rash    Outpatient Encounter Medications as of 02/09/2023  Medication Sig   acetaminophen (TYLENOL) 325 MG tablet Take 650 mg by mouth every 6 (six) hours as needed.   aspirin EC 81 MG tablet Take 1 tablet (81 mg total) by mouth daily. Swallow whole.   atorvastatin (LIPITOR) 40 MG tablet Take 1 tablet (40 mg total) by mouth daily.  clopidogrel (PLAVIX) 75 MG tablet Take 75 mg by mouth daily.   clotrimazole-betamethasone (LOTRISONE) cream Apply 1 Application topically 2 (two) times daily as needed. Fax#[phone]   docusate sodium (COLACE) 100 MG capsule Take 100 mg by mouth 2 (two) times daily.   fexofenadine (ALLEGRA) 180 MG tablet Take 180 mg by mouth daily.   gabapentin (NEURONTIN) 300 MG capsule TAKE 1 CAPSULE BY MOUTH THREE TIMES A DAY   Menthol,  Topical Analgesic, (BIOFREEZE ROLL-ON) 4 % GEL Apply topically. Apply to neck topically two times a day   metoprolol succinate (TOPROL-XL) 25 MG 24 hr tablet Take 12.5 mg by mouth daily.   mometasone (ELOCON) 0.1 % cream Apply 1 Application topically daily.   nitroGLYCERIN (NITROSTAT) 0.4 MG SL tablet Place 1 tablet (0.4 mg total) under the tongue every 5 (five) minutes as needed for chest pain.   pantoprazole (PROTONIX) 40 MG tablet Take 1 tablet (40 mg total) by mouth daily.   polyvinyl alcohol (LIQUIFILM TEARS) 1.4 % ophthalmic solution Place 2 drops into the right eye 2 (two) times daily.   potassium chloride (KLOR-CON 10) 10 MEQ tablet Take 1 tablet (10 mEq total) by mouth daily.   pramoxine (CERAVE ITCH RELIEF) 1 % LOTN Apply 1 Application topically 2 (two) times daily.   [DISCONTINUED] hydrocortisone cream 1 % Apply 1 Application topically 2 (two) times daily. Apply to right arm topically two times a day related to PAIN IN RIGHT ELBOW   No facility-administered encounter medications on file as of 02/09/2023.    Review of Systems  Constitutional:  Negative for activity change, appetite change and fever.  HENT:  Negative for sore throat.   Eyes: Negative.   Cardiovascular:  Negative for chest pain and leg swelling.  Gastrointestinal:  Negative for abdominal distention, diarrhea and vomiting.  Genitourinary:  Negative for dysuria, frequency and urgency.  Skin:  Positive for rash. Negative for color change.  Neurological:  Negative for dizziness and headaches.  Psychiatric/Behavioral:  Negative for behavioral problems and sleep disturbance. The patient is not nervous/anxious.        Immunization History  Administered Date(s) Administered   Fluad Quad(high Dose 65+) 02/15/2022, 02/08/2023   Influenza Split 01/20/2012, 01/07/2013   Influenza Whole 02/09/2006, 01/11/2008   Influenza, High Dose Seasonal PF 12/30/2016, 12/27/2017, 01/08/2019   Influenza,inj,quad, With Preservative  12/29/2018   Influenza-Unspecified 01/17/2014, 01/14/2016, 12/30/2016   Moderna Covid-19 Fall Seasonal Vaccine 48yrs & older 03/22/2022   PFIZER Comirnaty(Gray Top)Covid-19 Tri-Sucrose Vaccine 05/28/2019, 03/04/2020, 09/23/2020   Pfizer Covid-19 Vaccine Bivalent Booster 62yrs & up 02/25/2022   Pneumococcal Conjugate-13 09/23/2015   Pneumococcal Polysaccharide-23 03/13/2008   Td 12/20/2007   Tdap 07/06/2021   Unspecified SARS-COV-2 Vaccination 04/30/2019   Zoster Recombinant(Shingrix) 03/11/2020, 06/20/2020   Pertinent  Health Maintenance Due  Topic Date Due   FOOT EXAM  03/09/2023   HEMOGLOBIN A1C  05/15/2023   INFLUENZA VACCINE  Completed   OPHTHALMOLOGY EXAM  Discontinued      05/04/2022    2:44 PM 06/01/2022   11:04 AM 06/15/2022    9:14 AM 06/23/2022    3:12 PM 10/19/2022   11:19 AM  Fall Risk  Falls in the past year? 1 1 1 1  Exclusion - non ambulatory  Was there an injury with Fall? 1 1 1 1    Was there an injury with Fall? - Comments    Fx Rt Ekbow. Followed by Orthopedic   Fall Risk Category Calculator 3 3 3 2    Fall Risk Category (  Retired) High      (RETIRED) Patient Fall Risk Level High fall risk      Patient at Risk for Falls Due to History of fall(s);Impaired balance/gait;Impaired mobility;Impaired vision History of fall(s);Impaired balance/gait;Impaired mobility;Impaired vision History of fall(s);Impaired balance/gait;Impaired mobility Orthopedic patient   Fall risk Follow up Falls evaluation completed Falls evaluation completed Falls evaluation completed Falls prevention discussed Falls evaluation completed     Vitals:   02/09/23 1318  BP: 126/63  Pulse: 72  Resp: 18  Temp: 97.6 F (36.4 C)  SpO2: 98%  Weight: 158 lb 14.4 oz (72.1 kg)  Height: 5\' 7"  (1.702 m)   Body mass index is 24.89 kg/m.  Physical Exam Constitutional:      Appearance: Normal appearance.  HENT:     Head: Normocephalic and atraumatic.     Mouth/Throat:     Mouth: Mucous membranes are  moist.  Eyes:     Conjunctiva/sclera: Conjunctivae normal.  Cardiovascular:     Rate and Rhythm: Normal rate and regular rhythm.     Pulses: Normal pulses.     Heart sounds: Normal heart sounds.  Pulmonary:     Effort: Pulmonary effort is normal.     Breath sounds: Normal breath sounds.  Abdominal:     General: Bowel sounds are normal.     Palpations: Abdomen is soft.  Musculoskeletal:        General: No swelling.     Cervical back: Normal range of motion.     Comments: Right elbow with limited ROM, fingers with arthritic deformities  Skin:    General: Skin is warm and dry.     Findings: Rash present.     Comments: Small rashes on back  Neurological:     General: No focal deficit present.     Mental Status: He is alert and oriented to person, place, and time.  Psychiatric:        Mood and Affect: Mood normal.        Behavior: Behavior normal.        Thought Content: Thought content normal.        Judgment: Judgment normal.      Labs reviewed: Recent Labs    03/23/22 2255 03/30/22 0000 05/11/22 0800 11/12/22 0000 02/08/23 0000  NA 132*   < > 134* 134* 134*  K 4.1   < > 4.0 3.8 4.1  CL 101   < > 100 103 99  CO2 23   < > 27* 24* 25*  GLUCOSE 148*  --   --   --   --   BUN 16   < > 10 13 15   CREATININE 0.77   < > 0.6 0.5* 0.6  CALCIUM 8.5*   < > 8.8 8.6* 9.2   < > = values in this interval not displayed.   Recent Labs    11/12/22 0000 02/08/23 0000  AST 21 24  ALT 17 22  ALKPHOS 134* 177*  ALBUMIN 3.2* 3.8   Recent Labs    03/23/22 2255 03/30/22 0000 11/12/22 0000 02/08/23 0000  WBC 5.0 7.2 7.4 7.7  NEUTROABS 3.3  --   --   --   HGB 12.8* 12.7* 12.9* 14.7  HCT 38.5* 37* 39* 44  MCV 94.1  --   --   --   PLT 216 356 215 294   Lab Results  Component Value Date   TSH 2.039 11/02/2021   Lab Results  Component Value Date   HGBA1C 5.6  11/12/2022   Lab Results  Component Value Date   CHOL 79 11/12/2022   HDL 33 (A) 11/12/2022   LDLCALC 30  11/12/2022   LDLDIRECT 72.0 09/27/2017   TRIG 80 11/12/2022   CHOLHDL 2 10/14/2021    Significant Diagnostic Results in last 30 days:  No results found.  Assessment/Plan  1. CAD S/P percutaneous coronary angioplasty -  no chest pains, stable -  continue Plavix, Atorvastatin and NTG PRN  2. Dry eyes -  right eye redness improved -  continue Artificial tears  3. Gastroesophageal reflux disease, unspecified whether esophagitis present -  stable -  continue Pantoprazole  4. Essential hypertension -  BP stable -  continue Metoprolol tartrate                                                                      5. Hypokalemia Lab Results  Component Value Date   K 4.1 02/08/2023   -  stable -  continue KCL    Family/ staff Communication: Discussed plan of care with resident and charge nurse  Labs/tests ordered:   None    Kenard Gower, DNP, MSN, FNP-BC [facility] [phone] (Monday-Friday 8:00 a.m. - 5:00 p.m.) [phone] (after hours)

## 2023-02-10 DIAGNOSIS — S079XXD Crushing injury of head, part unspecified, subsequent encounter: Secondary | ICD-10-CM | POA: Diagnosis not present

## 2023-02-10 DIAGNOSIS — R29898 Other symptoms and signs involving the musculoskeletal system: Secondary | ICD-10-CM | POA: Diagnosis not present

## 2023-02-10 DIAGNOSIS — R1312 Dysphagia, oropharyngeal phase: Secondary | ICD-10-CM | POA: Diagnosis not present

## 2023-02-15 ENCOUNTER — Encounter: Payer: Self-pay | Admitting: Nurse Practitioner

## 2023-02-15 ENCOUNTER — Non-Acute Institutional Stay (SKILLED_NURSING_FACILITY): Payer: Medicare Other | Admitting: Nurse Practitioner

## 2023-02-15 DIAGNOSIS — S079XXD Crushing injury of head, part unspecified, subsequent encounter: Secondary | ICD-10-CM | POA: Diagnosis not present

## 2023-02-15 DIAGNOSIS — R29898 Other symptoms and signs involving the musculoskeletal system: Secondary | ICD-10-CM | POA: Diagnosis not present

## 2023-02-15 DIAGNOSIS — I480 Paroxysmal atrial fibrillation: Secondary | ICD-10-CM | POA: Diagnosis not present

## 2023-02-15 DIAGNOSIS — K219 Gastro-esophageal reflux disease without esophagitis: Secondary | ICD-10-CM

## 2023-02-15 DIAGNOSIS — E876 Hypokalemia: Secondary | ICD-10-CM

## 2023-02-15 DIAGNOSIS — I10 Essential (primary) hypertension: Secondary | ICD-10-CM

## 2023-02-15 DIAGNOSIS — E871 Hypo-osmolality and hyponatremia: Secondary | ICD-10-CM

## 2023-02-15 DIAGNOSIS — R1312 Dysphagia, oropharyngeal phase: Secondary | ICD-10-CM | POA: Diagnosis not present

## 2023-02-15 DIAGNOSIS — R748 Abnormal levels of other serum enzymes: Secondary | ICD-10-CM | POA: Diagnosis not present

## 2023-02-15 DIAGNOSIS — T17900A Unspecified foreign body in respiratory tract, part unspecified causing asphyxiation, initial encounter: Secondary | ICD-10-CM | POA: Diagnosis not present

## 2023-02-15 LAB — COMPREHENSIVE METABOLIC PANEL
Albumin: 3.5 (ref 3.5–5.0)
Globulin: 2.3

## 2023-02-15 LAB — HEPATIC FUNCTION PANEL
ALT: 20 U/L (ref 10–40)
AST: 25 (ref 14–40)
Alkaline Phosphatase: 134 — AB (ref 25–125)
Bilirubin, Direct: 0.2
Bilirubin, Total: 1

## 2023-02-15 NOTE — Assessment & Plan Note (Signed)
Heart rate is in control,  off Eliquis 08/09/22 per cardiology, on  Metoprolol, Plavix, ASA

## 2023-02-15 NOTE — Assessment & Plan Note (Signed)
reported choking episode last night at dinner when the patient had a bit of hotdog, lodged item was removed by heimlich maneuver. The patient denied throat and chest pain, discomfort, cough, trouble swallowing, difficulty breathing, or SOB. Working with ST currently.  Chopped meat May x-ray if s/s of aspiration PNA develop

## 2023-02-15 NOTE — Progress Notes (Unsigned)
Location:   SNF [facility] Room Number: 9 Place of Service:  SNF (31) Provider: [facility] Gerarda Conklin NP  Nelwyn Salisbury, MD  Patient Care Team: Nelwyn Salisbury, MD as PCP - General (Family Medicine) Lennette Bihari, MD as PCP - Cardiology (Cardiology) Ernesto Rutherford, MD as Consulting Physician (Ophthalmology) Luciana Axe Alford Highland, MD as Consulting Physician (Ophthalmology)  Extended Emergency Contact Information Primary Emergency Contact: Elspeth Cho [place] Home Phone: [phone] Mobile Phone: [phone] Relation: Daughter Secondary Emergency Contact: Doree Albee [place] Home Phone: [phone] Relation: Daughter  Code Status: DNR Goals of care: Advanced Directive information    02/09/2023    1:36 PM  Advanced Directives  Does Patient Have a Medical Advance Directive? Yes  Type of Estate agent of [place];Living will  Does patient want to make changes to medical advance directive? No - Patient declined  Copy of Healthcare Power of Attorney in Chart? Yes - validated most recent copy scanned in chart (See row information)     Chief Complaint  Patient presents with   Acute Visit    Choking episode last night.     HPI:  Pt is a 89 y.o. male seen today for an acute visit for reported choking episode last night at dinner when the patient had a bit of hotdog, lodged item was removed by heimlich maneuver. The patient denied throat and chest pain, discomfort, cough, trouble swallowing, difficulty breathing, or SOB. Working with ST currently.    Chronic pressure areas in sacral/buttocks region, on and off, pressure reduction with frequent bed rest through out day helped.  03/24/23 right elbow fx(distal end of right humerus), mild swelling fingers, pain is well controlled, in hinged elbow ROM brace             CAD ST elevation MI LAD 05/2021, underwent Cath, stenting, s/p percutaneous coronary angioplasty, started DAPT,  Echo EF 40-45%,  65-70% 11/02/21, started Plavix 12/24/21 per [facility]             PE 11/02/21, had CTA 05/12/22, 09/08/22 Cardiology dc Eliquis, start ASA 81mg  qd, continue Plavix 75mg  qd. Repeated CT @ 6 mos,  negative PE             Grade 2 diastolic dysfunction, compensated             Afib, off Eliquis 08/09/22 per cardiology, on  Metoprolol, Plavix, ASA             GERD, thing liquid, hx of dilatation,  takes Pantoprazole, Hgb 14.7 02/08/23             Hypokalemia, on Kcl K 4.1 02/08/23             Hyponatremia, Na 134 02/08/23 baseline.              HLD on Atorvastatin             HTN, on Metoprolol             Peripheral neuropathy, on Gabapentin Past Medical History:  Diagnosis Date   Arthritis    Diverticulosis of colon    External thrombosed hemorrhoids 06/11/2009   Qualifier: Diagnosis of  By: Clent Ridges MD, Tera Mater    GERD (gastroesophageal reflux disease)    Hypertension    Past Surgical History:  Procedure Laterality Date   cataract extaction, right  6/09   per Dr. Kennedy Bucker   CATARACT EXTRACTION Left    COLONOSCOPY  09/23/04  per Dr. Sherin Quarry, clear, no repeats needed    CORONARY STENT INTERVENTION N/A 06/06/2021   Procedure: CORONARY STENT INTERVENTION;  Surgeon: Yvonne Kendall, MD;  Location: [facility];  Service: Cardiovascular;  Laterality: N/A;   CORONARY/GRAFT ACUTE MI REVASCULARIZATION N/A 06/06/2021   Procedure: Coronary/Graft Acute MI Revascularization;  Surgeon: Yvonne Kendall, MD;  Location: [facility];  Service: Cardiovascular;  Laterality: N/A;   EGD and esophageal diatation,  05-10-11   per Dr. Christella Hartigan     HERNIA REPAIR     LEFT HEART CATH AND CORONARY ANGIOGRAPHY N/A 06/06/2021   Procedure: LEFT HEART CATH AND CORONARY ANGIOGRAPHY;  Surgeon: Yvonne Kendall, MD;  Location: [facility];  Service: Cardiovascular;  Laterality: N/A;   rt inguinal hernia     x2    Allergies  Allergen Reactions   Contrast Media [Iodinated Contrast Media] Rash     Allergies as of 02/15/2023       Reactions   Contrast Media [iodinated Contrast Media] Rash        Medication List        Accurate as of February 15, 2023 11:59 PM. If you have any questions, ask your nurse or doctor.          acetaminophen 325 MG tablet Commonly known as: TYLENOL Take 650 mg by mouth every 6 (six) hours as needed.   aspirin EC 81 MG tablet Take 1 tablet (81 mg total) by mouth daily. Swallow whole.   atorvastatin 40 MG tablet Commonly known as: LIPITOR Take 1 tablet (40 mg total) by mouth daily.   Biofreeze Roll-On 4 % Gel Generic drug: Menthol (Topical Analgesic) Apply topically. Apply to neck topically two times a day   CeraVe Itch Relief 1 % Lotn Generic drug: pramoxine Apply 1 Application topically 2 (two) times daily.   clopidogrel 75 MG tablet Commonly known as: PLAVIX Take 75 mg by mouth daily.   clotrimazole-betamethasone cream Commonly known as: LOTRISONE Apply 1 Application topically 2 (two) times daily as needed. Fax#[phone]   docusate sodium 100 MG capsule Commonly known as: COLACE Take 100 mg by mouth 2 (two) times daily.   fexofenadine 180 MG tablet Commonly known as: ALLEGRA Take 180 mg by mouth daily.   gabapentin 300 MG capsule Commonly known as: NEURONTIN TAKE 1 CAPSULE BY MOUTH THREE TIMES A DAY   metoprolol succinate 25 MG 24 hr tablet Commonly known as: TOPROL-XL Take 12.5 mg by mouth daily.   mometasone 0.1 % cream Commonly known as: ELOCON Apply 1 Application topically daily.   nitroGLYCERIN 0.4 MG SL tablet Commonly known as: Nitrostat Place 1 tablet (0.4 mg total) under the tongue every 5 (five) minutes as needed for chest pain.   pantoprazole 40 MG tablet Commonly known as: PROTONIX Take 1 tablet (40 mg total) by mouth daily.   polyvinyl alcohol 1.4 % ophthalmic solution Commonly known as: LIQUIFILM TEARS Place 2 drops into the right eye 2 (two) times daily.   potassium chloride 10 MEQ  tablet Commonly known as: Klor-Con 10 Take 1 tablet (10 mEq total) by mouth daily.        Review of Systems  Constitutional:  Negative for appetite change, fatigue and fever.  HENT:  Positive for trouble swallowing. Negative for congestion and sore throat.   Eyes:  Negative for visual disturbance.  Respiratory:  Negative for cough and shortness of breath.   Cardiovascular:  Negative for leg swelling.  Gastrointestinal:  Negative for abdominal pain and  constipation.  Genitourinary:  Negative for dysuria, frequency and urgency.       Incontinent of urine  Musculoskeletal:  Positive for arthralgias and gait problem. Negative for joint swelling.  Skin:  Negative for color change, rash and wound.  Neurological:  Negative for speech difficulty, weakness and headaches.  Psychiatric/Behavioral:  Negative for confusion and sleep disturbance. The patient is not nervous/anxious.     Immunization History  Administered Date(s) Administered   Fluad Quad(high Dose 65+) 02/15/2022, 02/08/2023   Influenza Split 01/20/2012, 01/07/2013   Influenza Whole 02/09/2006, 01/11/2008   Influenza, High Dose Seasonal PF 12/30/2016, 12/27/2017, 01/08/2019   Influenza,inj,quad, With Preservative 12/29/2018   Influenza-Unspecified 01/17/2014, 01/14/2016, 12/30/2016   Moderna Covid-19 Fall Seasonal Vaccine 72yrs & older 03/22/2022   PFIZER Comirnaty(Gray Top)Covid-19 Tri-Sucrose Vaccine 05/28/2019, 03/04/2020, 09/23/2020   Pfizer Covid-19 Vaccine Bivalent Booster 67yrs & up 02/25/2022   Pneumococcal Conjugate-13 09/23/2015   Pneumococcal Polysaccharide-23 03/13/2008   Td 12/20/2007   Tdap 07/06/2021   Unspecified SARS-COV-2 Vaccination 04/30/2019   Zoster Recombinant(Shingrix) 03/11/2020, 06/20/2020   Pertinent  Health Maintenance Due  Topic Date Due   FOOT EXAM  03/09/2023   HEMOGLOBIN A1C  05/15/2023   INFLUENZA VACCINE  Completed   OPHTHALMOLOGY EXAM  Discontinued      05/04/2022    2:44 PM  06/01/2022   11:04 AM 06/15/2022    9:14 AM 06/23/2022    3:12 PM 10/19/2022   11:19 AM  Fall Risk  Falls in the past year? 1 1 1 1  Exclusion - non ambulatory  Was there an injury with Fall? 1 1 1 1    Was there an injury with Fall? - Comments    Fx Rt Ekbow. Followed by Orthopedic   Fall Risk Category Calculator 3 3 3 2    Fall Risk Category (Retired) High      (RETIRED) Patient Fall Risk Level High fall risk      Patient at Risk for Falls Due to History of fall(s);Impaired balance/gait;Impaired mobility;Impaired vision History of fall(s);Impaired balance/gait;Impaired mobility;Impaired vision History of fall(s);Impaired balance/gait;Impaired mobility Orthopedic patient   Fall risk Follow up Falls evaluation completed Falls evaluation completed Falls evaluation completed Falls prevention discussed Falls evaluation completed   Functional Status Survey:    Vitals:   02/15/23 1300  BP: 126/63  Pulse: 73  Resp: 18  Temp: 97.6 F (36.4 C)  SpO2: 98%  Weight: 158 lb 14.4 oz (72.1 kg)   Body mass index is 24.89 kg/m. Physical Exam Vitals and nursing note reviewed.  Constitutional:      Appearance: Normal appearance.  HENT:     Head: Normocephalic.     Comments:      Nose: Nose normal.     Mouth/Throat:     Mouth: Mucous membranes are moist.  Eyes:     Extraocular Movements: Extraocular movements intact.     Conjunctiva/sclera: Conjunctivae normal.     Pupils: Pupils are equal, round, and reactive to light.  Cardiovascular:     Rate and Rhythm: Normal rate and regular rhythm.     Heart sounds: No murmur heard. Pulmonary:     Effort: Pulmonary effort is normal.     Breath sounds: Normal breath sounds. No rales.  Abdominal:     General: Bowel sounds are normal.     Palpations: Abdomen is soft.     Tenderness: There is no abdominal tenderness.  Musculoskeletal:     Cervical back: Normal range of motion and neck supple.  Right lower leg: No edema.     Left lower leg: No  edema.     Comments: Right elbow in a hinged ROM brace, able to make a fist, denied pain or numbness. Chronic neck aches.  Skin:    General: Skin is warm and dry.     Comments: Better since bedrest between meals of previous c/o right buttock/perineal area pain, mild diffused redness, blanchable,  from moist and sitting in power w/c noted.    Neurological:     General: No focal deficit present.     Mental Status: He is alert and oriented to person, place, and time. Mental status is at baseline.     Gait: Gait abnormal.  Psychiatric:        Mood and Affect: Mood normal.        Behavior: Behavior normal.        Thought Content: Thought content normal.     Labs reviewed: Recent Labs    03/23/22 2255 03/30/22 0000 05/11/22 0800 11/12/22 0000 02/08/23 0000  NA 132*   < > 134* 134* 134*  K 4.1   < > 4.0 3.8 4.1  CL 101   < > 100 103 99  CO2 23   < > 27* 24* 25*  GLUCOSE 148*  --   --   --   --   BUN 16   < > 10 13 15   CREATININE 0.77   < > 0.6 0.5* 0.6  CALCIUM 8.5*   < > 8.8 8.6* 9.2   < > = values in this interval not displayed.   Recent Labs    11/12/22 0000 02/08/23 0000  AST 21 24  ALT 17 22  ALKPHOS 134* 177*  ALBUMIN 3.2* 3.8   Recent Labs    03/23/22 2255 03/30/22 0000 11/12/22 0000 02/08/23 0000  WBC 5.0 7.2 7.4 7.7  NEUTROABS 3.3  --   --   --   HGB 12.8* 12.7* 12.9* 14.7  HCT 38.5* 37* 39* 44  MCV 94.1  --   --   --   PLT 216 356 215 294   Lab Results  Component Value Date   TSH 2.039 11/02/2021   Lab Results  Component Value Date   HGBA1C 5.6 11/12/2022   Lab Results  Component Value Date   CHOL 79 11/12/2022   HDL 33 (A) 11/12/2022   LDLCALC 30 11/12/2022   LDLDIRECT 72.0 09/27/2017   TRIG 80 11/12/2022   CHOLHDL 2 10/14/2021    Significant Diagnostic Results in last 30 days:  No results found.  Assessment/Plan: Choking due to foreign body  reported choking episode last night at dinner when the patient had a bit of hotdog, lodged  item was removed by heimlich maneuver. The patient denied throat and chest pain, discomfort, cough, trouble swallowing, difficulty breathing, or SOB. Working with ST currently.  Chopped meat May x-ray if s/s of aspiration PNA develop  GERD thing liquid, hx of dilatation,  takes Pantoprazole, Hgb 14.7 02/08/23  Hypokalemia on Kcl K 4.1 02/08/23  Hyponatremia  Na 134 02/08/23 baseline.   Essential hypertension  on Metoprolol, blood pressure is controlled,   Paroxysmal atrial fibrillation (HCC) Heart rate is in control,  off Eliquis 08/09/22 per cardiology, on  Metoprolol, Plavix, ASA    Family/ staff Communication: plan of care reviewed with the patient and charge nurse.   Labs/tests ordered:  none  Time spend 35 minutes.

## 2023-02-15 NOTE — Assessment & Plan Note (Signed)
on Metoprolol, blood pressure is controlled,

## 2023-02-15 NOTE — Assessment & Plan Note (Signed)
thing liquid, hx of dilatation,  takes Pantoprazole, Hgb 14.7 02/08/23

## 2023-02-15 NOTE — Assessment & Plan Note (Signed)
on Kcl K 4.1 02/08/23

## 2023-02-15 NOTE — Assessment & Plan Note (Signed)
Na 134 02/08/23 baseline.

## 2023-02-16 ENCOUNTER — Non-Acute Institutional Stay (SKILLED_NURSING_FACILITY): Payer: Medicare Other | Admitting: Nurse Practitioner

## 2023-02-16 ENCOUNTER — Encounter: Payer: Self-pay | Admitting: Nurse Practitioner

## 2023-02-16 DIAGNOSIS — M150 Primary generalized (osteo)arthritis: Secondary | ICD-10-CM

## 2023-02-16 DIAGNOSIS — Z9861 Coronary angioplasty status: Secondary | ICD-10-CM

## 2023-02-16 DIAGNOSIS — K219 Gastro-esophageal reflux disease without esophagitis: Secondary | ICD-10-CM | POA: Diagnosis not present

## 2023-02-16 DIAGNOSIS — I480 Paroxysmal atrial fibrillation: Secondary | ICD-10-CM

## 2023-02-16 DIAGNOSIS — I10 Essential (primary) hypertension: Secondary | ICD-10-CM

## 2023-02-16 DIAGNOSIS — Z86711 Personal history of pulmonary embolism: Secondary | ICD-10-CM | POA: Diagnosis not present

## 2023-02-16 DIAGNOSIS — I2510 Atherosclerotic heart disease of native coronary artery without angina pectoris: Secondary | ICD-10-CM

## 2023-02-16 DIAGNOSIS — R1310 Dysphagia, unspecified: Secondary | ICD-10-CM

## 2023-02-16 NOTE — Assessment & Plan Note (Signed)
PE 11/02/21, had CTA 05/12/22, 09/08/22 Cardiology dc Eliquis, start ASA 81mg qd, continue Plavix 75mg qd. Repeated CT @ 6 mos,  negative PE 

## 2023-02-16 NOTE — Assessment & Plan Note (Signed)
ST elevation MI LAD 05/2021, underwent Cath, stenting, s/p percutaneous coronary angioplasty, started DAPT, Echo EF 40-45%,  65-70% 11/02/21, started Plavix 12/24/21 per [facility] 

## 2023-02-16 NOTE — Assessment & Plan Note (Signed)
thing liquid, hx of dilatation,  takes Pantoprazole, Hgb 14.7 02/08/23

## 2023-02-16 NOTE — Assessment & Plan Note (Signed)
off Eliquis 08/09/22 per cardiology, on  Metoprolol, Plavix, ASA

## 2023-02-16 NOTE — Assessment & Plan Note (Signed)
Chopped meats, continue ST

## 2023-02-16 NOTE — Assessment & Plan Note (Signed)
03/24/23 right elbow fx(distal end of right humerus), mild swelling fingers, pain is well not controlled, in hinged elbow ROM brace during day, therapy is going to try different brace at night, has been to surgeon-non surgical candidate. The patient desires delaying X-ray.  c/o right arm pain, more at night, only wants Tylenol at night.  Observe.

## 2023-02-16 NOTE — Progress Notes (Unsigned)
Location:  [facility] Room Number: 40 Place of Service:  SNF (31) Provider:  Tesia Lybrand X, NP    Patient Care Team: Nelwyn Salisbury, MD as PCP - General (Family Medicine) Lennette Bihari, MD as PCP - Cardiology (Cardiology) Ernesto Rutherford, MD as Consulting Physician (Ophthalmology) Luciana Axe Alford Highland, MD as Consulting Physician (Ophthalmology)  Extended Emergency Contact Information Primary Emergency Contact: Elspeth Cho [place] Home Phone: [phone] Mobile Phone: [phone] Relation: Daughter Secondary Emergency Contact: Doree Albee [place] Home Phone: [phone] Relation: Daughter  Code Status:  Full Code Goals of care: Advanced Directive information    02/16/2023    1:26 PM  Advanced Directives  Does Patient Have a Medical Advance Directive? Yes  Type of Estate agent of [place];Living will  Does patient want to make changes to medical advance directive? No - Patient declined  Copy of Healthcare Power of Attorney in Chart? Yes - validated most recent copy scanned in chart (See row information)     Chief Complaint  Patient presents with  . Acute Visit    right arm pain    HPI:  Pt is a 89 y.o. male seen today for an acute visit for c/o right arm pain, more at night, only wants Tylenol at night.    Chronic pressure areas in sacral/buttocks region, on and off, pressure reduction with frequent bed rest through out day helped.  03/24/23 right elbow fx(distal end of right humerus), mild swelling fingers, pain is well not controlled, in hinged elbow ROM brace during day, therapy is going to try different brace at night, has been to surgeon-non surgical candidate. The patient desires delaying X-ray.              CAD ST elevation MI LAD 05/2021, underwent Cath, stenting, s/p percutaneous coronary angioplasty, started DAPT, Echo EF 40-45%,  65-70% 11/02/21, started Plavix 12/24/21 per  [facility]             PE 11/02/21, had CTA 05/12/22, 09/08/22 Cardiology dc Eliquis, start ASA 81mg  qd, continue Plavix 75mg  qd. Repeated CT @ 6 mos,  negative PE             Grade 2 diastolic dysfunction, compensated             Afib, off Eliquis 08/09/22 per cardiology, on  Metoprolol, Plavix, ASA             GERD, thing liquid, hx of dilatation,  takes Pantoprazole, Hgb 14.7 02/08/23             Hypokalemia, on Kcl K 4.1 02/08/23             Hyponatremia, Na 134 02/08/23 baseline.              HLD on Atorvastatin             HTN, on Metoprolol             Peripheral neuropathy, on Gabapentin      Past Medical History:  Diagnosis Date  . Arthritis   . Diverticulosis of colon   . External thrombosed hemorrhoids 06/11/2009   Qualifier: Diagnosis of  By: Clent Ridges MD, Tera Mater   . GERD (gastroesophageal reflux disease)   . Hypertension    Past Surgical History:  Procedure Laterality Date  . cataract extaction, right  6/09   per Dr. Kennedy Bucker  . CATARACT EXTRACTION Left   . COLONOSCOPY  09/23/04  per Dr. Sherin Quarry, clear, no repeats needed   . CORONARY STENT INTERVENTION N/A 06/06/2021   Procedure: CORONARY STENT INTERVENTION;  Surgeon: Yvonne Kendall, MD;  Location: [facility];  Service: Cardiovascular;  Laterality: N/A;  . CORONARY/GRAFT ACUTE MI REVASCULARIZATION N/A 06/06/2021   Procedure: Coronary/Graft Acute MI Revascularization;  Surgeon: Yvonne Kendall, MD;  Location: [facility];  Service: Cardiovascular;  Laterality: N/A;  . EGD and esophageal diatation,  05-10-11   per Dr. Christella Hartigan    . HERNIA REPAIR    . LEFT HEART CATH AND CORONARY ANGIOGRAPHY N/A 06/06/2021   Procedure: LEFT HEART CATH AND CORONARY ANGIOGRAPHY;  Surgeon: Yvonne Kendall, MD;  Location: [facility];  Service: Cardiovascular;  Laterality: N/A;  . rt inguinal hernia     x2    Allergies  Allergen Reactions  . Contrast Media [Iodinated Contrast Media] Rash    Outpatient Encounter  Medications as of 02/16/2023  Medication Sig  . acetaminophen (TYLENOL) 325 MG tablet Take 650 mg by mouth every 6 (six) hours as needed.  Marland Kitchen aspirin EC 81 MG tablet Take 1 tablet (81 mg total) by mouth daily. Swallow whole.  Marland Kitchen atorvastatin (LIPITOR) 40 MG tablet Take 1 tablet (40 mg total) by mouth daily.  . clopidogrel (PLAVIX) 75 MG tablet Take 75 mg by mouth daily.  . clotrimazole-betamethasone (LOTRISONE) cream Apply 1 Application topically 2 (two) times daily as needed. Fax#[phone]  . docusate sodium (COLACE) 100 MG capsule Take 100 mg by mouth 2 (two) times daily.  . fexofenadine (ALLEGRA) 180 MG tablet Take 180 mg by mouth daily.  Marland Kitchen gabapentin (NEURONTIN) 300 MG capsule TAKE 1 CAPSULE BY MOUTH THREE TIMES A DAY  . Menthol, Topical Analgesic, (BIOFREEZE ROLL-ON) 4 % GEL Apply topically. Apply to neck topically two times a day  . metoprolol succinate (TOPROL-XL) 25 MG 24 hr tablet Take 12.5 mg by mouth daily.  . mometasone (ELOCON) 0.1 % cream Apply 1 Application topically daily.  . nitroGLYCERIN (NITROSTAT) 0.4 MG SL tablet Place 1 tablet (0.4 mg total) under the tongue every 5 (five) minutes as needed for chest pain.  . pantoprazole (PROTONIX) 40 MG tablet Take 1 tablet (40 mg total) by mouth daily.  . polyvinyl alcohol (LIQUIFILM TEARS) 1.4 % ophthalmic solution Place 2 drops into the right eye 2 (two) times daily.  . potassium chloride (KLOR-CON 10) 10 MEQ tablet Take 1 tablet (10 mEq total) by mouth daily.  . pramoxine (CERAVE ITCH RELIEF) 1 % LOTN Apply 1 Application topically 2 (two) times daily.   No facility-administered encounter medications on file as of 02/16/2023.    Review of Systems  Constitutional:  Negative for appetite change, fatigue and fever.  HENT:  Positive for trouble swallowing. Negative for congestion and sore throat.   Eyes:  Negative for visual disturbance.  Respiratory:  Negative for cough and shortness of breath.   Cardiovascular:  Negative for leg  swelling.  Gastrointestinal:  Negative for abdominal pain and constipation.  Genitourinary:  Negative for dysuria, frequency and urgency.       Incontinent of urine  Musculoskeletal:  Positive for arthralgias and gait problem. Negative for joint swelling.       R arm pain.   Skin:  Negative for color change.  Neurological:  Negative for speech difficulty, weakness and headaches.  Psychiatric/Behavioral:  Negative for confusion and sleep disturbance. The patient is not nervous/anxious.     Immunization History  Administered Date(s) Administered  . Fluad Quad(high  Dose 65+) 02/15/2022, 02/08/2023  . Influenza Split 01/20/2012, 01/07/2013  . Influenza Whole 02/09/2006, 01/11/2008  . Influenza, High Dose Seasonal PF 12/30/2016, 12/27/2017, 01/08/2019  . Influenza,inj,quad, With Preservative 12/29/2018  . Influenza-Unspecified 01/17/2014, 01/14/2016, 12/30/2016  . Moderna Covid-19 Fall Seasonal Vaccine 79yrs & older 03/22/2022  . PFIZER Comirnaty(Gray Top)Covid-19 Tri-Sucrose Vaccine 05/28/2019, 03/04/2020, 09/23/2020  . Research officer, trade [employer] 52yrs & up 02/25/2022  . Pneumococcal Conjugate-13 09/23/2015  . Pneumococcal Polysaccharide-23 03/13/2008  . Td 12/20/2007  . Tdap 07/06/2021  . Unspecified SARS-COV-2 Vaccination 04/30/2019  . Zoster Recombinant(Shingrix) 03/11/2020, 06/20/2020   Pertinent  Health Maintenance Due  Topic Date Due  . FOOT EXAM  03/09/2023  . HEMOGLOBIN A1C  05/15/2023  . INFLUENZA VACCINE  Completed  . OPHTHALMOLOGY EXAM  Discontinued      05/04/2022    2:44 PM 06/01/2022   11:04 AM 06/15/2022    9:14 AM 06/23/2022    3:12 PM 10/19/2022   11:19 AM  Fall Risk  Falls in the past year? 1 1 1 1  Exclusion - non ambulatory  Was there an injury with Fall? 1 1 1 1    Was there an injury with Fall? - Comments    Fx Rt Ekbow. Followed by Orthopedic   Fall Risk Category Calculator 3 3 3 2    Fall Risk Category (Retired) High      (RETIRED) Patient Fall  Risk Level High fall risk      Patient at Risk for Falls Due to History of fall(s);Impaired balance/gait;Impaired mobility;Impaired vision History of fall(s);Impaired balance/gait;Impaired mobility;Impaired vision History of fall(s);Impaired balance/gait;Impaired mobility Orthopedic patient   Fall risk Follow up Falls evaluation completed Falls evaluation completed Falls evaluation completed Falls prevention discussed Falls evaluation completed   Functional Status Survey:    Vitals:   02/16/23 1320  BP: 126/63  Pulse: 73  Resp: 18  Temp: 97.6 F (36.4 C)  SpO2: 98%  Weight: 158 lb 14.4 oz (72.1 kg)  Height: 5\' 7"  (1.702 m)   Body mass index is 24.89 kg/m. Physical Exam Vitals and nursing note reviewed.  Constitutional:      Appearance: Normal appearance.  HENT:     Head: Normocephalic.     Comments:      Nose: Nose normal.     Mouth/Throat:     Mouth: Mucous membranes are moist.  Eyes:     Extraocular Movements: Extraocular movements intact.     Conjunctiva/sclera: Conjunctivae normal.     Pupils: Pupils are equal, round, and reactive to light.  Cardiovascular:     Rate and Rhythm: Normal rate and regular rhythm.     Heart sounds: No murmur heard. Pulmonary:     Effort: Pulmonary effort is normal.     Breath sounds: Normal breath sounds. No rales.  Abdominal:     General: Bowel sounds are normal.     Palpations: Abdomen is soft.     Tenderness: There is no abdominal tenderness.  Musculoskeletal:        General: Tenderness present.     Cervical back: Normal range of motion and neck supple.     Right lower leg: No edema.     Left lower leg: No edema.     Comments: Right elbow in a hinged ROM brace, able to make a fist, pain at night, chronic neck pain-positional.   Skin:    General: Skin is warm and dry.     Comments: Chronic perineal/buttocks skin irritation from moist and heat  Neurological:     General: No focal deficit present.     Mental Status: He is  alert and oriented to person, place, and time. Mental status is at baseline.     Gait: Gait abnormal.  Psychiatric:        Mood and Affect: Mood normal.        Behavior: Behavior normal.        Thought Content: Thought content normal.    Labs reviewed: Recent Labs    03/23/22 2255 03/30/22 0000 05/11/22 0800 11/12/22 0000 02/08/23 0000  NA 132*   < > 134* 134* 134*  K 4.1   < > 4.0 3.8 4.1  CL 101   < > 100 103 99  CO2 23   < > 27* 24* 25*  GLUCOSE 148*  --   --   --   --   BUN 16   < > 10 13 15   CREATININE 0.77   < > 0.6 0.5* 0.6  CALCIUM 8.5*   < > 8.8 8.6* 9.2   < > = values in this interval not displayed.   Recent Labs    11/12/22 0000 02/08/23 0000 02/15/23 0000  AST 21 24 25   ALT 17 22 20   ALKPHOS 134* 177* 134*  ALBUMIN 3.2* 3.8 3.5   Recent Labs    03/23/22 2255 03/30/22 0000 11/12/22 0000 02/08/23 0000  WBC 5.0 7.2 7.4 7.7  NEUTROABS 3.3  --   --   --   HGB 12.8* 12.7* 12.9* 14.7  HCT 38.5* 37* 39* 44  MCV 94.1  --   --   --   PLT 216 356 215 294   Lab Results  Component Value Date   TSH 2.039 11/02/2021   Lab Results  Component Value Date   HGBA1C 5.6 11/12/2022   Lab Results  Component Value Date   CHOL 79 11/12/2022   HDL 33 (A) 11/12/2022   LDLCALC 30 11/12/2022   LDLDIRECT 72.0 09/27/2017   TRIG 80 11/12/2022   CHOLHDL 2 10/14/2021    Significant Diagnostic Results in last 30 days:  No results found.  Assessment/Plan Osteoarthritis 03/24/23 right elbow fx(distal end of right humerus), mild swelling fingers, pain is well not controlled, in hinged elbow ROM brace during day, therapy is going to try different brace at night, has been to surgeon-non surgical candidate. The patient desires delaying X-ray.  c/o right arm pain, more at night, only wants Tylenol at night.  Observe.   CAD S/P percutaneous coronary angioplasty ST elevation MI LAD 05/2021, underwent Cath, stenting, s/p percutaneous coronary angioplasty, started DAPT, Echo  EF 40-45%,  65-70% 11/02/21, started Plavix 12/24/21 per [facility]  History of pulmonary embolism PE 11/02/21, had CTA 05/12/22, 09/08/22 Cardiology dc Eliquis, start ASA 81mg  qd, continue Plavix 75mg  qd. Repeated CT @ 6 mos,  negative PE  Paroxysmal atrial fibrillation (HCC)  off Eliquis 08/09/22 per cardiology, on  Metoprolol, Plavix, ASA  GERD thing liquid, hx of dilatation,  takes Pantoprazole, Hgb 14.7 02/08/23  Dysphagia Chopped meats, continue ST  Essential hypertension Blood pressure is controlled, continue Metoprolol.   Neuropathy, peripheral Uses power w/c for mobility, on Gabapentin     Family/ staff Communication: plan of care reviewed with the patient and charge nurse.   Labs/tests ordered:  none  Time spend 35 minutes.

## 2023-02-16 NOTE — Assessment & Plan Note (Signed)
Blood pressure is controlled, continue Metoprolol. 

## 2023-02-16 NOTE — Assessment & Plan Note (Signed)
Uses power w/c for mobility, on Gabapentin

## 2023-02-17 DIAGNOSIS — R29898 Other symptoms and signs involving the musculoskeletal system: Secondary | ICD-10-CM | POA: Diagnosis not present

## 2023-02-17 DIAGNOSIS — S079XXD Crushing injury of head, part unspecified, subsequent encounter: Secondary | ICD-10-CM | POA: Diagnosis not present

## 2023-02-17 DIAGNOSIS — R1312 Dysphagia, oropharyngeal phase: Secondary | ICD-10-CM | POA: Diagnosis not present

## 2023-02-22 DIAGNOSIS — S079XXD Crushing injury of head, part unspecified, subsequent encounter: Secondary | ICD-10-CM | POA: Diagnosis not present

## 2023-02-22 DIAGNOSIS — R29898 Other symptoms and signs involving the musculoskeletal system: Secondary | ICD-10-CM | POA: Diagnosis not present

## 2023-02-22 DIAGNOSIS — R1312 Dysphagia, oropharyngeal phase: Secondary | ICD-10-CM | POA: Diagnosis not present

## 2023-02-24 ENCOUNTER — Encounter: Payer: Self-pay | Admitting: Sports Medicine

## 2023-02-24 ENCOUNTER — Non-Acute Institutional Stay (SKILLED_NURSING_FACILITY): Payer: Medicare Other | Admitting: Sports Medicine

## 2023-02-24 DIAGNOSIS — R1312 Dysphagia, oropharyngeal phase: Secondary | ICD-10-CM | POA: Diagnosis not present

## 2023-02-24 DIAGNOSIS — S079XXD Crushing injury of head, part unspecified, subsequent encounter: Secondary | ICD-10-CM | POA: Diagnosis not present

## 2023-02-24 DIAGNOSIS — R451 Restlessness and agitation: Secondary | ICD-10-CM | POA: Diagnosis not present

## 2023-02-24 DIAGNOSIS — R29898 Other symptoms and signs involving the musculoskeletal system: Secondary | ICD-10-CM | POA: Diagnosis not present

## 2023-02-24 DIAGNOSIS — R21 Rash and other nonspecific skin eruption: Secondary | ICD-10-CM

## 2023-02-24 DIAGNOSIS — R1310 Dysphagia, unspecified: Secondary | ICD-10-CM | POA: Diagnosis not present

## 2023-02-24 NOTE — Progress Notes (Signed)
Location:  [facility] Room Number: N065-A Place of Service:  SNF [phone]) Provider:  Willey Blade, MD   Patient Care Team: Nelwyn Salisbury, MD as PCP - General (Family Medicine) Lennette Bihari, MD as PCP - Cardiology (Cardiology) Ernesto Rutherford, MD as Consulting Physician (Ophthalmology) Luciana Axe Alford Highland, MD as Consulting Physician (Ophthalmology)  Extended Emergency Contact Information Primary Emergency Contact: Elspeth Cho [place] Home Phone: [phone] Mobile Phone: [phone] Relation: Daughter Secondary Emergency Contact: Doree Albee [place] Home Phone: [phone] Relation: Daughter  Code Status:  Full Code  Goals of care: Advanced Directive information    02/24/2023    9:29 AM  Advanced Directives  Does Patient Have a Medical Advance Directive? Yes  Type of Estate agent of [place];Living will  Does patient want to make changes to medical advance directive? No - Patient declined  Copy of Healthcare Power of Attorney in Chart? Yes - validated most recent copy scanned in chart (See row information)     Chief Complaint  Patient presents with   Acute Visit    Agitation and increased behaviors     HPI:  Pt is a 89 y.o. male seen today for a care plan meeting with his daughter. Social Financial controller and Theatre manager available for the meeting.  Time spent - 1 hour  Rash- Pt has rash on his bottom since few months, he followed with dermatology. They prescribed compounded cream with nystatin, hydrocortisone, zinc oxide. Pt spends most of the time in her power scooter all day long. Daughter has concerns about the compounded cream which was prescribed by dermatology.  Staff reported that at times he rans out of the cream  before  the refill time.  Daughter informs that dermatology would not see due to insurance issues.  She is concerned that pt is having difficulty swallowing. He had 2  episodes during the last few days . As per chart review pt had h/o benign esophageal stricture which was dilated in 2022.  He is currently working with speech therapy here.  Increased irritability - pt has episodes lately that he is irritable and rude towards the staff. Daughter even noticed this behavior.      Past Medical History:  Diagnosis Date   Arthritis    Diverticulosis of colon    External thrombosed hemorrhoids 06/11/2009   Qualifier: Diagnosis of  By: Clent Ridges MD, Tera Mater    GERD (gastroesophageal reflux disease)    Hypertension    Past Surgical History:  Procedure Laterality Date   cataract extaction, right  6/09   per Dr. Kennedy Bucker   CATARACT EXTRACTION Left    COLONOSCOPY  09/23/04   per Dr. Sherin Quarry, clear, no repeats needed    CORONARY STENT INTERVENTION N/A 06/06/2021   Procedure: CORONARY STENT INTERVENTION;  Surgeon: Yvonne Kendall, MD;  Location: [facility];  Service: Cardiovascular;  Laterality: N/A;   CORONARY/GRAFT ACUTE MI REVASCULARIZATION N/A 06/06/2021   Procedure: Coronary/Graft Acute MI Revascularization;  Surgeon: Yvonne Kendall, MD;  Location: [facility];  Service: Cardiovascular;  Laterality: N/A;   EGD and esophageal diatation,  05-10-11   per Dr. Christella Hartigan     HERNIA REPAIR     LEFT HEART CATH AND CORONARY ANGIOGRAPHY N/A 06/06/2021   Procedure: LEFT HEART CATH AND CORONARY ANGIOGRAPHY;  Surgeon: Yvonne Kendall, MD;  Location: [facility];  Service: Cardiovascular;  Laterality: N/A;   rt inguinal hernia  x2    Allergies  Allergen Reactions   Contrast Media [Iodinated Contrast Media] Rash    Outpatient Encounter Medications as of 02/24/2023  Medication Sig   acetaminophen (TYLENOL) 325 MG tablet Take 650 mg by mouth every 6 (six) hours as needed.   aspirin EC 81 MG tablet Take 1 tablet (81 mg total) by mouth daily. Swallow whole.   atorvastatin (LIPITOR) 40 MG tablet Take 1 tablet (40 mg total) by mouth daily.    clopidogrel (PLAVIX) 75 MG tablet Take 75 mg by mouth daily.   clotrimazole-betamethasone (LOTRISONE) cream Apply 1 Application topically 2 (two) times daily as needed. Fax#[phone]   docusate sodium (COLACE) 100 MG capsule Take 100 mg by mouth 2 (two) times daily.   fexofenadine (ALLEGRA) 180 MG tablet Take 180 mg by mouth daily.   gabapentin (NEURONTIN) 300 MG capsule TAKE 1 CAPSULE BY MOUTH THREE TIMES A DAY   Menthol, Topical Analgesic, (BIOFREEZE ROLL-ON) 4 % GEL Apply topically. Apply to neck topically two times a day   metoprolol succinate (TOPROL-XL) 25 MG 24 hr tablet Take 12.5 mg by mouth daily.   mometasone (ELOCON) 0.1 % cream Apply 1 Application topically daily.   nitroGLYCERIN (NITROSTAT) 0.4 MG SL tablet Place 1 tablet (0.4 mg total) under the tongue every 5 (five) minutes as needed for chest pain.   pantoprazole (PROTONIX) 40 MG tablet Take 1 tablet (40 mg total) by mouth daily.   polyvinyl alcohol (LIQUIFILM TEARS) 1.4 % ophthalmic solution Place 2 drops into the right eye 2 (two) times daily.   potassium chloride (KLOR-CON 10) 10 MEQ tablet Take 1 tablet (10 mEq total) by mouth daily.   pramoxine (CERAVE ITCH RELIEF) 1 % LOTN Apply 1 Application topically 2 (two) times daily.   No facility-administered encounter medications on file as of 02/24/2023.    Review of Systems  Constitutional:  Negative for chills and fever.  Respiratory:  Negative for cough, shortness of breath and wheezing.   Cardiovascular:  Negative for chest pain, palpitations and leg swelling.  Gastrointestinal:  Negative for abdominal distention, abdominal pain, blood in stool, constipation, diarrhea, nausea and vomiting.  Genitourinary:  Negative for dysuria, frequency and urgency.  Skin:  Positive for rash.  Neurological:  Negative for dizziness, weakness and numbness.  Psychiatric/Behavioral:  Positive for behavioral problems.     Immunization History  Administered Date(s) Administered   Fluad  Quad(high Dose 65+) 02/15/2022, 02/08/2023   Influenza Split 01/20/2012, 01/07/2013   Influenza Whole 02/09/2006, 01/11/2008   Influenza, High Dose Seasonal PF 12/30/2016, 12/27/2017, 01/08/2019   Influenza,inj,quad, With Preservative 12/29/2018   Influenza-Unspecified 01/17/2014, 01/14/2016, 12/30/2016   Moderna Covid-19 Fall Seasonal Vaccine 23yrs & older 03/22/2022   PFIZER Comirnaty(Gray Top)Covid-19 Tri-Sucrose Vaccine 05/28/2019, 03/04/2020, 09/23/2020   Pfizer Covid-19 Vaccine Bivalent Booster 43yrs & up 02/25/2022   Pneumococcal Conjugate-13 09/23/2015   Pneumococcal Polysaccharide-23 03/13/2008   Td 12/20/2007   Tdap 07/06/2021   Unspecified SARS-COV-2 Vaccination 04/30/2019   Zoster Recombinant(Shingrix) 03/11/2020, 06/20/2020   Pertinent  Health Maintenance Due  Topic Date Due   FOOT EXAM  03/09/2023   HEMOGLOBIN A1C  05/15/2023   INFLUENZA VACCINE  Completed   OPHTHALMOLOGY EXAM  Discontinued      05/04/2022    2:44 PM 06/01/2022   11:04 AM 06/15/2022    9:14 AM 06/23/2022    3:12 PM 10/19/2022   11:19 AM  Fall Risk  Falls in the past year? 1 1 1 1  Exclusion - non ambulatory  Was  there an injury with Fall? 1 1 1 1    Was there an injury with Fall? - Comments    Fx Rt Ekbow. Followed by Orthopedic   Fall Risk Category Calculator 3 3 3 2    Fall Risk Category (Retired) High      (RETIRED) Patient Fall Risk Level High fall risk      Patient at Risk for Falls Due to History of fall(s);Impaired balance/gait;Impaired mobility;Impaired vision History of fall(s);Impaired balance/gait;Impaired mobility;Impaired vision History of fall(s);Impaired balance/gait;Impaired mobility Orthopedic patient   Fall risk Follow up Falls evaluation completed Falls evaluation completed Falls evaluation completed Falls prevention discussed Falls evaluation completed   Functional Status Survey:    Vitals:   02/24/23 0927  BP: 139/66  Pulse: 62  Temp: 97.6 F (36.4 C)  Weight: 158 lb (71.7 kg)   Height: 5\' 7"  (1.702 m)   Body mass index is 24.75 kg/m. Physical Exam Constitutional:      Appearance: Normal appearance.  HENT:     Head: Normocephalic and atraumatic.  Cardiovascular:     Rate and Rhythm: Normal rate and regular rhythm.     Pulses: Normal pulses.     Heart sounds: Normal heart sounds.  Pulmonary:     Effort: No respiratory distress.     Breath sounds: No stridor. No wheezing or rales.  Abdominal:     General: Bowel sounds are normal. There is no distension.     Palpations: Abdomen is soft.     Tenderness: There is no abdominal tenderness. There is no right CVA tenderness or guarding.  Musculoskeletal:        General: No swelling.  Neurological:     Mental Status: He is alert. Mental status is at baseline.     Sensory: No sensory deficit.     Motor: No weakness.     Labs reviewed: Recent Labs    03/23/22 2255 03/30/22 0000 05/11/22 0800 11/12/22 0000 02/08/23 0000  NA 132*   < > 134* 134* 134*  K 4.1   < > 4.0 3.8 4.1  CL 101   < > 100 103 99  CO2 23   < > 27* 24* 25*  GLUCOSE 148*  --   --   --   --   BUN 16   < > 10 13 15   CREATININE 0.77   < > 0.6 0.5* 0.6  CALCIUM 8.5*   < > 8.8 8.6* 9.2   < > = values in this interval not displayed.   Recent Labs    11/12/22 0000 02/08/23 0000 02/15/23 0000  AST 21 24 25   ALT 17 22 20   ALKPHOS 134* 177* 134*  ALBUMIN 3.2* 3.8 3.5   Recent Labs    03/23/22 2255 03/30/22 0000 11/12/22 0000 02/08/23 0000  WBC 5.0 7.2 7.4 7.7  NEUTROABS 3.3  --   --   --   HGB 12.8* 12.7* 12.9* 14.7  HCT 38.5* 37* 39* 44  MCV 94.1  --   --   --   PLT 216 356 215 294   Lab Results  Component Value Date   TSH 2.039 11/02/2021   Lab Results  Component Value Date   HGBA1C 5.6 11/12/2022   Lab Results  Component Value Date   CHOL 79 11/12/2022   HDL 33 (A) 11/12/2022   LDLCALC 30 11/12/2022   LDLDIRECT 72.0 09/27/2017   TRIG 80 11/12/2022   CHOLHDL 2 10/14/2021    Significant Diagnostic Results  in last  30 days:  No results found.  Assessment/Plan  1. Dysphagia, unspecified type Spoke with daughter and she agreed for modified barium swallow test  Cont with speech therapy  2. Rash Need frequent repositioning  Informed patient to keep the pressure off Cont with compounded cream  Social worker to find out about insurance issue regarding dermatology appt   3. Agitation Will start effexor Informed patient and daughter about the side effects of the medication   Family/ staff Communication: care plan discussed with the staff  Labs/tests ordered:  none

## 2023-02-28 DIAGNOSIS — S079XXD Crushing injury of head, part unspecified, subsequent encounter: Secondary | ICD-10-CM | POA: Diagnosis not present

## 2023-03-01 DIAGNOSIS — S079XXD Crushing injury of head, part unspecified, subsequent encounter: Secondary | ICD-10-CM | POA: Diagnosis not present

## 2023-03-02 DIAGNOSIS — S079XXD Crushing injury of head, part unspecified, subsequent encounter: Secondary | ICD-10-CM | POA: Diagnosis not present

## 2023-03-04 DIAGNOSIS — S079XXD Crushing injury of head, part unspecified, subsequent encounter: Secondary | ICD-10-CM | POA: Diagnosis not present

## 2023-03-07 ENCOUNTER — Encounter: Payer: Self-pay | Admitting: Sports Medicine

## 2023-03-07 ENCOUNTER — Non-Acute Institutional Stay (SKILLED_NURSING_FACILITY): Payer: Self-pay | Admitting: Sports Medicine

## 2023-03-07 DIAGNOSIS — R0989 Other specified symptoms and signs involving the circulatory and respiratory systems: Secondary | ICD-10-CM | POA: Diagnosis not present

## 2023-03-07 DIAGNOSIS — R059 Cough, unspecified: Secondary | ICD-10-CM | POA: Diagnosis not present

## 2023-03-07 DIAGNOSIS — R1310 Dysphagia, unspecified: Secondary | ICD-10-CM | POA: Diagnosis not present

## 2023-03-07 DIAGNOSIS — F411 Generalized anxiety disorder: Secondary | ICD-10-CM | POA: Diagnosis not present

## 2023-03-07 DIAGNOSIS — S079XXD Crushing injury of head, part unspecified, subsequent encounter: Secondary | ICD-10-CM | POA: Diagnosis not present

## 2023-03-07 NOTE — Progress Notes (Signed)
Provider:  Venita Sheffield  MD Location:   [facility]   Place of Service:   Skilled care  PCP: Nelwyn Salisbury, MD Patient Care Team: Nelwyn Salisbury, MD as PCP - General (Family Medicine) Lennette Bihari, MD as PCP - Cardiology (Cardiology) Ernesto Rutherford, MD as Consulting Physician (Ophthalmology) Luciana Axe Alford Highland, MD as Consulting Physician (Ophthalmology)  Extended Emergency Contact Information Primary Emergency Contact: Elspeth Cho [place] Home Phone: [phone] Mobile Phone: [phone] Relation: Daughter Secondary Emergency Contact: Doree Albee [place] Home Phone: [phone] Relation: Daughter  Code Status:  Goals of Care: Advanced Directive information    02/24/2023    9:29 AM  Advanced Directives  Does Patient Have a Medical Advance Directive? Yes  Type of Estate agent of [place];Living will  Does patient want to make changes to medical advance directive? No - Patient declined  Copy of Healthcare Power of Attorney in Chart? Yes - validated most recent copy scanned in chart (See row information)      No chief complaint on file.   HPI: Patient is a 89 y.o. male seen today for acute visit  Pt was started on effexor for irritability and GAD last week. Daughter was concerned that since she started taking this medication he is very lethargic and not his usual self. Effexor was stopped last Friday.  Pt seen and examined in his room, he states he had a busy day with physical therapy for his neck and participated in some activities today at the facility.  Pt reports that he is on liquid diet.  He is scheduled for modified swallow test scheduled tomorrow.  C/o intermittent cough  Spoke with the daughter on the phone for 14 min, explained about the medication and adverse effect profile for effexor.      Past Medical History:  Diagnosis Date   Arthritis    Diverticulosis of colon     External thrombosed hemorrhoids 06/11/2009   Qualifier: Diagnosis of  By: Clent Ridges MD, Tera Mater    GERD (gastroesophageal reflux disease)    Hypertension    Past Surgical History:  Procedure Laterality Date   cataract extaction, right  6/09   per Dr. Kennedy Bucker   CATARACT EXTRACTION Left    COLONOSCOPY  09/23/04   per Dr. Sherin Quarry, clear, no repeats needed    CORONARY STENT INTERVENTION N/A 06/06/2021   Procedure: CORONARY STENT INTERVENTION;  Surgeon: Yvonne Kendall, MD;  Location: [facility];  Service: Cardiovascular;  Laterality: N/A;   CORONARY/GRAFT ACUTE MI REVASCULARIZATION N/A 06/06/2021   Procedure: Coronary/Graft Acute MI Revascularization;  Surgeon: Yvonne Kendall, MD;  Location: [facility];  Service: Cardiovascular;  Laterality: N/A;   EGD and esophageal diatation,  05-10-11   per Dr. Christella Hartigan     HERNIA REPAIR     LEFT HEART CATH AND CORONARY ANGIOGRAPHY N/A 06/06/2021   Procedure: LEFT HEART CATH AND CORONARY ANGIOGRAPHY;  Surgeon: Yvonne Kendall, MD;  Location: [facility];  Service: Cardiovascular;  Laterality: N/A;   rt inguinal hernia     x2    reports that he has quit smoking. His smoking use included cigarettes. He has a 7.5 pack-year smoking history. He has never used smokeless tobacco. He reports that he does not drink alcohol and does not use drugs. Social History   Socioeconomic History   Marital status: Widowed    Spouse name: Not on file   Number of children: 3  Years of education: Not on file   Highest education level: Not on file  Occupational History   Occupation: retired  Tobacco Use   Smoking status: Former    Current packs/day: 15.00    Average packs/day: 15.0 packs/day for 0.5 years (7.5 ttl pk-yrs)    Types: Cigarettes   Smokeless tobacco: Never   Tobacco comments:    smoked 18 and stopped and states he stopped 30's   Vaping Use   Vaping status: Never Used  Substance and Sexual Activity   Alcohol use: No    Alcohol/week:  0.0 standard drinks of alcohol   Drug use: No   Sexual activity: Never    Birth control/protection: Abstinence  Other Topics Concern   Not on file  Social History Narrative   Lives in studio at [facility]   Widowed since 2009, was married x 57 years.    3 daughters.    Social Determinants of Health   Financial Resource Strain: Low Risk  (06/23/2022)   Overall Financial Resource Strain (CARDIA)    Difficulty of Paying Living Expenses: Not hard at all  Food Insecurity: No Food Insecurity (06/23/2022)   Hunger Vital Sign    Worried About Running Out of Food in the Last Year: Never true    Ran Out of Food in the Last Year: Never true  Transportation Needs: No Transportation Needs (06/23/2022)   PRAPARE - Administrator, Civil Service (Medical): No    Lack of Transportation (Non-Medical): No  Physical Activity: Sufficiently Active (06/23/2022)   Exercise Vital Sign    Days of Exercise per Week: 4 days    Minutes of Exercise per Session: 60 min  Stress: No Stress Concern Present (06/23/2022)   [employer] of Occupational Health - Occupational Stress Questionnaire    Feeling of Stress : Not at all  Social Connections: Moderately Integrated (06/23/2022)   Social Connection and Isolation Panel [NHANES]    Frequency of Communication with Friends and Family: More than three times a week    Frequency of Social Gatherings with Friends and Family: More than three times a week    Attends Religious Services: More than 4 times per year    Active Member of [employer] or Organizations: Yes    Attends Banker Meetings: More than 4 times per year    Marital Status: Widowed  Intimate Partner Violence: Not At Risk (06/23/2022)   Humiliation, Afraid, Rape, and Kick questionnaire    Fear of Current or Ex-Partner: No    Emotionally Abused: No    Physically Abused: No    Sexually Abused: No    Functional Status Survey:    Family History  Problem Relation Age of Onset    Coronary artery disease Father    Colon cancer Neg Hx    Stomach cancer Neg Hx    Esophageal cancer Neg Hx    Pancreatic cancer Neg Hx     Health Maintenance  Topic Date Due   COVID-19 Vaccine (6 - 2023-24 season) 12/26/2022   FOOT EXAM  03/09/2023   HEMOGLOBIN A1C  05/15/2023   Medicare Annual Wellness (AWV)  06/24/2023   DTaP/Tdap/Td (3 - Td or Tdap) 07/07/2031   Pneumonia Vaccine 70+ Years old  Completed   INFLUENZA VACCINE  Completed   Zoster Vaccines- Shingrix  Completed   HPV VACCINES  Aged Out   OPHTHALMOLOGY EXAM  Discontinued    Allergies  Allergen Reactions   Contrast Media [Iodinated Contrast Media] Rash  Outpatient Encounter Medications as of 03/07/2023  Medication Sig   acetaminophen (TYLENOL) 325 MG tablet Take 650 mg by mouth every 6 (six) hours as needed.   aspirin EC 81 MG tablet Take 1 tablet (81 mg total) by mouth daily. Swallow whole.   atorvastatin (LIPITOR) 40 MG tablet Take 1 tablet (40 mg total) by mouth daily.   clopidogrel (PLAVIX) 75 MG tablet Take 75 mg by mouth daily.   clotrimazole-betamethasone (LOTRISONE) cream Apply 1 Application topically 2 (two) times daily as needed. Fax#[phone]   docusate sodium (COLACE) 100 MG capsule Take 100 mg by mouth 2 (two) times daily.   fexofenadine (ALLEGRA) 180 MG tablet Take 180 mg by mouth daily.   gabapentin (NEURONTIN) 300 MG capsule TAKE 1 CAPSULE BY MOUTH THREE TIMES A DAY   Menthol, Topical Analgesic, (BIOFREEZE ROLL-ON) 4 % GEL Apply topically. Apply to neck topically two times a day   metoprolol succinate (TOPROL-XL) 25 MG 24 hr tablet Take 12.5 mg by mouth daily.   mometasone (ELOCON) 0.1 % cream Apply 1 Application topically daily.   nitroGLYCERIN (NITROSTAT) 0.4 MG SL tablet Place 1 tablet (0.4 mg total) under the tongue every 5 (five) minutes as needed for chest pain.   pantoprazole (PROTONIX) 40 MG tablet Take 1 tablet (40 mg total) by mouth daily.   polyvinyl alcohol (LIQUIFILM TEARS) 1.4 %  ophthalmic solution Place 2 drops into the right eye 2 (two) times daily.   potassium chloride (KLOR-CON 10) 10 MEQ tablet Take 1 tablet (10 mEq total) by mouth daily.   pramoxine (CERAVE ITCH RELIEF) 1 % LOTN Apply 1 Application topically 2 (two) times daily.   No facility-administered encounter medications on file as of 03/07/2023.    Review of Systems  Constitutional:  Negative for fever.  HENT:  Negative for sore throat.   Respiratory:  Positive for cough. Negative for shortness of breath and wheezing.   Cardiovascular:  Negative for chest pain, palpitations and leg swelling.  Gastrointestinal:  Negative for abdominal distention, abdominal pain, blood in stool, constipation, diarrhea, nausea and vomiting.  Genitourinary:  Negative for dysuria and frequency.  Neurological:  Negative for dizziness.    There were no vitals filed for this visit. There is no height or weight on file to calculate BMI. Physical Exam Constitutional:      Appearance: Normal appearance.  Cardiovascular:     Rate and Rhythm: Normal rate and regular rhythm.     Pulses: Normal pulses.     Heart sounds: Normal heart sounds.  Pulmonary:     Effort: No respiratory distress.     Breath sounds: No stridor. No wheezing or rales.  Abdominal:     General: Bowel sounds are normal. There is no distension.     Palpations: Abdomen is soft.     Tenderness: There is no abdominal tenderness. There is no right CVA tenderness or guarding.  Musculoskeletal:        General: No swelling.  Neurological:     Mental Status: He is alert. Mental status is at baseline.     Labs reviewed: Basic Metabolic Panel: Recent Labs    03/23/22 2255 03/30/22 0000 05/11/22 0800 11/12/22 0000 02/08/23 0000  NA 132*   < > 134* 134* 134*  K 4.1   < > 4.0 3.8 4.1  CL 101   < > 100 103 99  CO2 23   < > 27* 24* 25*  GLUCOSE 148*  --   --   --   --  BUN 16   < > 10 13 15   CREATININE 0.77   < > 0.6 0.5* 0.6  CALCIUM 8.5*   < > 8.8  8.6* 9.2   < > = values in this interval not displayed.   Liver Function Tests: Recent Labs    11/12/22 0000 02/08/23 0000 02/15/23 0000  AST 21 24 25   ALT 17 22 20   ALKPHOS 134* 177* 134*  ALBUMIN 3.2* 3.8 3.5   No results for input(s): "LIPASE", "AMYLASE" in the last 8760 hours. No results for input(s): "AMMONIA" in the last 8760 hours. CBC: Recent Labs    03/23/22 2255 03/30/22 0000 11/12/22 0000 02/08/23 0000  WBC 5.0 7.2 7.4 7.7  NEUTROABS 3.3  --   --   --   HGB 12.8* 12.7* 12.9* 14.7  HCT 38.5* 37* 39* 44  MCV 94.1  --   --   --   PLT 216 356 215 294   Cardiac Enzymes: No results for input(s): "CKTOTAL", "CKMB", "CKMBINDEX", "TROPONINI" in the last 8760 hours. BNP: Invalid input(s): "POCBNP" Lab Results  Component Value Date   HGBA1C 5.6 11/12/2022   Lab Results  Component Value Date   TSH 2.039 11/02/2021   Lab Results  Component Value Date   VITAMINB12 417 11/12/2022   No results found for: "FOLATE" Lab Results  Component Value Date   FERRITIN 256 11/03/2021    Imaging and Procedures obtained prior to SNF admission: CT Angio Chest Pulmonary Embolism (PE) W or WO Contrast  Result Date: 05/12/2022 CLINICAL DATA:  Pleural effusion, history of blood clotting disorder, question pulmonary embolism EXAM: CT ANGIOGRAPHY CHEST WITH CONTRAST TECHNIQUE: Multidetector CT imaging of the chest was performed using the standard protocol during bolus administration of intravenous contrast. Multiplanar CT image reconstructions and MIPs were obtained to evaluate the vascular anatomy. Due to history of contrast allergy patient was premedicated for contrast administration. Patient suffered no reaction to IV contrast. RADIATION DOSE REDUCTION: This exam was performed according to the departmental dose-optimization program which includes automated exposure control, adjustment of the mA and/or kV according to patient size and/or use of iterative reconstruction technique.  CONTRAST:  75mL OMNIPAQUE IOHEXOL 350 MG/ML SOLN IV COMPARISON:  11/03/2021 FINDINGS: Cardiovascular: Atherosclerotic calcifications aorta, proximal great vessels, and coronary arteries. Aorta normal caliber without aneurysm or dissection. No pericardial effusion. Minimal scattered pericardial calcification noted. Pulmonary arteries adequately opacified and patent. No evidence of pulmonary embolism. Mediastinum/Nodes: Large hiatal hernia. Esophagus unremarkable. Base of cervical region normal appearance. No thoracic adenopathy. Lungs/Pleura: Dependent atelectasis in the posterior lungs bilaterally. Lungs otherwise clear. No pulmonary infiltrate, pleural effusion, or pneumothorax. Upper Abdomen: Cholelithiasis. Simple appearing cyst medial upper RIGHT kidney 3.0 x 2.5 cm; no follow-up imaging recommended. Diverticulosis at splenic flexure of colon. Musculoskeletal: No acute osseous findings. Review of the MIP images confirms the above findings. IMPRESSION: No evidence of pulmonary embolism. Large hiatal hernia. Dependent atelectasis in the posterior lungs bilaterally. Cholelithiasis. Diverticulosis at splenic flexure of colon. Scattered atherosclerotic calcifications including coronary arteries. Aortic Atherosclerosis (ICD10-I70.0). Electronically Signed   By: Ulyses Southward M.D.   On: 05/12/2022 15:55    Assessment/Plan  1. Dysphagia, unspecified type Pt scheduled for modified barium swallow test  Pt had esophageal stenosis  s/p stretching of his oesophagus in the past but was told that he is high risk due to his age last year by his GI  Daughter understands and informs that it would be [place] decision whether he would want to go for EGD  for stretching or not  Will follow up the results   2. Cough, unspecified type Intermittent cough  Will order chest x ray  Will get cbc, bmp  GAD  Pt could not tolerate effexor  Pt is back to his usual self As per nurse pt is irritable today  Will monitor       Family/ staff Communication:  care plan discussed with the nursing staff  Labs/tests ordered: cbc, bmp chest x ray   I spent greater than 30 minutes for the care of this patient in face to face time, chart review, clinical documentation, patient education, communicating with the daughter care of plan.

## 2023-03-08 ENCOUNTER — Encounter: Payer: Self-pay | Admitting: Nurse Practitioner

## 2023-03-08 DIAGNOSIS — S079XXD Crushing injury of head, part unspecified, subsequent encounter: Secondary | ICD-10-CM | POA: Diagnosis not present

## 2023-03-08 DIAGNOSIS — B372 Candidiasis of skin and nail: Secondary | ICD-10-CM | POA: Diagnosis not present

## 2023-03-08 DIAGNOSIS — L309 Dermatitis, unspecified: Secondary | ICD-10-CM | POA: Diagnosis not present

## 2023-03-08 DIAGNOSIS — I10 Essential (primary) hypertension: Secondary | ICD-10-CM | POA: Diagnosis not present

## 2023-03-08 LAB — BASIC METABOLIC PANEL
BUN: 7 (ref 4–21)
CO2: 24 — AB (ref 13–22)
Chloride: 98 — AB (ref 99–108)
Creatinine: 0.4 — AB (ref 0.6–1.3)
Glucose: 103
Potassium: 3.5 meq/L (ref 3.5–5.1)
Sodium: 132 — AB (ref 137–147)

## 2023-03-08 LAB — CBC AND DIFFERENTIAL
HCT: 40 — AB (ref 41–53)
Hemoglobin: 13.5 (ref 13.5–17.5)
Platelets: 300 10*3/uL (ref 150–400)
WBC: 8.1

## 2023-03-08 LAB — COMPREHENSIVE METABOLIC PANEL
Calcium: 8.7 (ref 8.7–10.7)
eGFR: 101

## 2023-03-08 LAB — CBC: RBC: 4.54 (ref 3.87–5.11)

## 2023-03-08 NOTE — Progress Notes (Signed)
This encounter was created in error - please disregard.

## 2023-03-09 ENCOUNTER — Encounter: Payer: Self-pay | Admitting: Nurse Practitioner

## 2023-03-09 ENCOUNTER — Non-Acute Institutional Stay (SKILLED_NURSING_FACILITY): Payer: Self-pay | Admitting: Nurse Practitioner

## 2023-03-09 DIAGNOSIS — E785 Hyperlipidemia, unspecified: Secondary | ICD-10-CM | POA: Diagnosis not present

## 2023-03-09 DIAGNOSIS — F419 Anxiety disorder, unspecified: Secondary | ICD-10-CM | POA: Diagnosis not present

## 2023-03-09 DIAGNOSIS — K219 Gastro-esophageal reflux disease without esophagitis: Secondary | ICD-10-CM | POA: Diagnosis not present

## 2023-03-09 DIAGNOSIS — S079XXD Crushing injury of head, part unspecified, subsequent encounter: Secondary | ICD-10-CM | POA: Diagnosis not present

## 2023-03-09 DIAGNOSIS — G609 Hereditary and idiopathic neuropathy, unspecified: Secondary | ICD-10-CM

## 2023-03-09 DIAGNOSIS — E871 Hypo-osmolality and hyponatremia: Secondary | ICD-10-CM | POA: Diagnosis not present

## 2023-03-09 DIAGNOSIS — R1310 Dysphagia, unspecified: Secondary | ICD-10-CM | POA: Diagnosis not present

## 2023-03-09 DIAGNOSIS — I10 Essential (primary) hypertension: Secondary | ICD-10-CM | POA: Diagnosis not present

## 2023-03-09 DIAGNOSIS — E876 Hypokalemia: Secondary | ICD-10-CM

## 2023-03-09 DIAGNOSIS — I480 Paroxysmal atrial fibrillation: Secondary | ICD-10-CM

## 2023-03-09 DIAGNOSIS — I5189 Other ill-defined heart diseases: Secondary | ICD-10-CM

## 2023-03-09 NOTE — Assessment & Plan Note (Signed)
compensated 

## 2023-03-09 NOTE — Progress Notes (Signed)
This encounter was created in error - please disregard.

## 2023-03-09 NOTE — Assessment & Plan Note (Signed)
off Eliquis 08/09/22 per cardiology, on  Metoprolol, Plavix, ASA

## 2023-03-09 NOTE — Assessment & Plan Note (Signed)
thing liquid, hx of dilatation,  takes Pantoprazole, Hgb 13.5 03/08/23

## 2023-03-09 NOTE — Assessment & Plan Note (Signed)
on Atorvastatin, LDL 30 11/12/22

## 2023-03-09 NOTE — Assessment & Plan Note (Signed)
Na 132 03/08/23, pending f/u BMP one week.

## 2023-03-09 NOTE — Assessment & Plan Note (Signed)
K 3.5 03/08/23, continue Kcl.

## 2023-03-09 NOTE — Assessment & Plan Note (Signed)
failed effexor, stated sleeps well at night.

## 2023-03-09 NOTE — Assessment & Plan Note (Signed)
choked in the recent past, chopped meat with thin fluid presently. PendingMBSS, pt's decision if further EGD for dilation or not, last GI told him he is high risk. CXR normal 03/07/23

## 2023-03-09 NOTE — Progress Notes (Signed)
Location:   SNF [facility] Room Number: 42 Place of Service:  SNF (31) Provider: [facility] Kamaiya Antilla NP  Nelwyn Salisbury, MD  Patient Care Team: Nelwyn Salisbury, MD as PCP - General (Family Medicine) Lennette Bihari, MD as PCP - Cardiology (Cardiology) Ernesto Rutherford, MD as Consulting Physician (Ophthalmology) Luciana Axe Alford Highland, MD as Consulting Physician (Ophthalmology)  Extended Emergency Contact Information Primary Emergency Contact: Elspeth Cho [place] Home Phone: [phone] Mobile Phone: [phone] Relation: Daughter Secondary Emergency Contact: Doree Albee [place] Home Phone: [phone] Relation: Daughter  Code Status:  DNR Goals of care: Advanced Directive information    02/24/2023    9:29 AM  Advanced Directives  Does Patient Have a Medical Advance Directive? Yes  Type of Estate agent of [place];Living will  Does patient want to make changes to medical advance directive? No - Patient declined  Copy of Healthcare Power of Attorney in Chart? Yes - validated most recent copy scanned in chart (See row information)     Chief Complaint  Patient presents with  . Medical Management of Chronic Issues    HPI:  Pt is a 89 y.o. male seen today for medical management of chronic diseases.    Dysphagia, choked in the recent past, chopped meat with thin fluid presently. PendingMBSS, pt's decision if further EGD for dilation or not, last GI told him he is high risk. CXR normal 03/07/23  Anxiety, failed effexor, stated sleeps well at night.   The right arm pain, more at night, only wants Tylenol at night.               Chronic pressure areas in sacral/buttocks region, on and off, pressure reduction with frequent bed rest through out day helped.  03/24/23 right elbow fx(distal end of right humerus), mild swelling fingers, pain is well not controlled, in hinged elbow ROM brace during day, therapy is going to try  different brace at night, has been to surgeon-non surgical candidate. The patient desires delaying X-ray.              CAD ST elevation MI LAD 05/2021, underwent Cath, stenting, s/p percutaneous coronary angioplasty, started DAPT, Echo EF 40-45%,  65-70% 11/02/21, started Plavix 12/24/21 per [facility]             PE 11/02/21, had CTA 05/12/22, 09/08/22 Cardiology dc Eliquis, start ASA 81mg  qd, continue Plavix 75mg  qd. Repeated CT @ 6 mos,  negative PE             Grade 2 diastolic dysfunction, compensated             Afib, off Eliquis 08/09/22 per cardiology, on  Metoprolol, Plavix, ASA             GERD, thing liquid, hx of dilatation,  takes Pantoprazole, Hgb 13.5 03/08/23             Hypokalemia, on Kcl K 3.5 03/08/23             Hyponatremia, Na 132 03/08/23, pending f/u BMP one week.              HLD on Atorvastatin, LDL 30 11/12/22             HTN, on Metoprolol             Peripheral neuropathy, on Gabapentin      Past Medical History:  Diagnosis Date  . Arthritis   . Diverticulosis of colon   .  External thrombosed hemorrhoids 06/11/2009   Qualifier: Diagnosis of  By: Clent Ridges MD, Tera Mater   . GERD (gastroesophageal reflux disease)   . Hypertension    Past Surgical History:  Procedure Laterality Date  . cataract extaction, right  6/09   per Dr. Kennedy Bucker  . CATARACT EXTRACTION Left   . COLONOSCOPY  09/23/04   per Dr. Sherin Quarry, clear, no repeats needed   . CORONARY STENT INTERVENTION N/A 06/06/2021   Procedure: CORONARY STENT INTERVENTION;  Surgeon: Yvonne Kendall, MD;  Location: [facility];  Service: Cardiovascular;  Laterality: N/A;  . CORONARY/GRAFT ACUTE MI REVASCULARIZATION N/A 06/06/2021   Procedure: Coronary/Graft Acute MI Revascularization;  Surgeon: Yvonne Kendall, MD;  Location: [facility];  Service: Cardiovascular;  Laterality: N/A;  . EGD and esophageal diatation,  05-10-11   per Dr. Christella Hartigan    . HERNIA REPAIR    . LEFT HEART CATH AND CORONARY ANGIOGRAPHY N/A  06/06/2021   Procedure: LEFT HEART CATH AND CORONARY ANGIOGRAPHY;  Surgeon: Yvonne Kendall, MD;  Location: [facility];  Service: Cardiovascular;  Laterality: N/A;  . rt inguinal hernia     x2    Allergies  Allergen Reactions  . Effexor [Venlafaxine]     Pt became lethargic, drowsy and not his usual self with effexor.  . Contrast Media [Iodinated Contrast Media] Rash    Allergies as of 03/09/2023       Reactions   Effexor [venlafaxine]    Pt became lethargic, drowsy and not his usual self with effexor.   Contrast Media [iodinated Contrast Media] Rash        Medication List        Accurate as of March 09, 2023  4:08 PM. If you have any questions, ask your nurse or doctor.          acetaminophen 325 MG tablet Commonly known as: TYLENOL Take 650 mg by mouth every 6 (six) hours as needed.   aspirin EC 81 MG tablet Take 1 tablet (81 mg total) by mouth daily. Swallow whole.   atorvastatin 40 MG tablet Commonly known as: LIPITOR Take 1 tablet (40 mg total) by mouth daily.   Biofreeze Roll-On 4 % Gel Generic drug: Menthol (Topical Analgesic) Apply topically. Apply to neck topically two times a day   CeraVe Itch Relief 1 % Lotn Generic drug: pramoxine Apply 1 Application topically 2 (two) times daily.   clopidogrel 75 MG tablet Commonly known as: PLAVIX Take 75 mg by mouth daily.   clotrimazole-betamethasone cream Commonly known as: LOTRISONE Apply 1 Application topically 2 (two) times daily as needed. Fax#[phone]   docusate sodium 100 MG capsule Commonly known as: COLACE Take 100 mg by mouth 2 (two) times daily.   fexofenadine 180 MG tablet Commonly known as: ALLEGRA Take 180 mg by mouth daily.   gabapentin 300 MG capsule Commonly known as: NEURONTIN TAKE 1 CAPSULE BY MOUTH THREE TIMES A DAY   metoprolol succinate 25 MG 24 hr tablet Commonly known as: TOPROL-XL Take 12.5 mg by mouth daily.   mometasone 0.1 % cream Commonly known as:  ELOCON Apply 1 Application topically daily.   nitroGLYCERIN 0.4 MG SL tablet Commonly known as: Nitrostat Place 1 tablet (0.4 mg total) under the tongue every 5 (five) minutes as needed for chest pain.   pantoprazole 40 MG tablet Commonly known as: PROTONIX Take 1 tablet (40 mg total) by mouth daily.   polyvinyl alcohol 1.4 % ophthalmic solution Commonly known as: LIQUIFILM TEARS  Place 2 drops into the right eye 2 (two) times daily.   potassium chloride 10 MEQ tablet Commonly known as: Klor-Con 10 Take 1 tablet (10 mEq total) by mouth daily.        Review of Systems  Constitutional:  Negative for appetite change, fatigue and fever.  HENT:  Positive for trouble swallowing. Negative for congestion and sore throat.   Eyes:  Negative for visual disturbance.  Respiratory:  Negative for cough and shortness of breath.   Cardiovascular:  Negative for leg swelling.  Gastrointestinal:  Negative for abdominal pain and constipation.  Genitourinary:  Negative for dysuria, frequency and urgency.       Incontinent of urine  Musculoskeletal:  Positive for arthralgias and gait problem. Negative for joint swelling.       R arm pain.   Skin:  Negative for color change.  Neurological:  Negative for speech difficulty, weakness and headaches.  Psychiatric/Behavioral:  Negative for confusion and sleep disturbance. The patient is not nervous/anxious.     Immunization History  Administered Date(s) Administered  . Fluad Quad(high Dose 65+) 02/15/2022, 02/08/2023  . Influenza Split 01/20/2012, 01/07/2013  . Influenza Whole 02/09/2006, 01/11/2008  . Influenza, High Dose Seasonal PF 12/30/2016, 12/27/2017, 01/08/2019  . Influenza,inj,quad, With Preservative 12/29/2018  . Influenza-Unspecified 01/17/2014, 01/14/2016, 12/30/2016  . Moderna Covid-19 Fall Seasonal Vaccine 106yrs & older 03/22/2022  . PFIZER Comirnaty(Gray Top)Covid-19 Tri-Sucrose Vaccine 05/28/2019, 03/04/2020, 09/23/2020  . Estate manager/land agent 87yrs & up 02/25/2022  . Pneumococcal Conjugate-13 09/23/2015  . Pneumococcal Polysaccharide-23 03/13/2008  . Td 12/20/2007  . Tdap 07/06/2021  . Unspecified SARS-COV-2 Vaccination 04/30/2019  . Zoster Recombinant(Shingrix) 03/11/2020, 06/20/2020   Pertinent  Health Maintenance Due  Topic Date Due  . FOOT EXAM  03/09/2023  . HEMOGLOBIN A1C  05/15/2023  . INFLUENZA VACCINE  Completed  . OPHTHALMOLOGY EXAM  Discontinued      05/04/2022    2:44 PM 06/01/2022   11:04 AM 06/15/2022    9:14 AM 06/23/2022    3:12 PM 10/19/2022   11:19 AM  Fall Risk  Falls in the past year? 1 1 1 1  Exclusion - non ambulatory  Was there an injury with Fall? 1 1 1 1    Was there an injury with Fall? - Comments    Fx Rt Ekbow. Followed by Orthopedic   Fall Risk Category Calculator 3 3 3 2    Fall Risk Category (Retired) High      (RETIRED) Patient Fall Risk Level High fall risk      Patient at Risk for Falls Due to History of fall(s);Impaired balance/gait;Impaired mobility;Impaired vision History of fall(s);Impaired balance/gait;Impaired mobility;Impaired vision History of fall(s);Impaired balance/gait;Impaired mobility Orthopedic patient   Fall risk Follow up Falls evaluation completed Falls evaluation completed Falls evaluation completed Falls prevention discussed Falls evaluation completed   Functional Status Survey:    Vitals:   03/09/23 1513  BP: (!) 144/72  Pulse: (!) 50  Resp: 15  Temp: 97.6 F (36.4 C)  SpO2: 97%  Weight: 158 lb 14.4 oz (72.1 kg)   Body mass index is 24.89 kg/m. Physical Exam Vitals and nursing note reviewed.  Constitutional:      Appearance: Normal appearance.  HENT:     Head: Normocephalic.     Comments:      Nose: Nose normal.     Mouth/Throat:     Mouth: Mucous membranes are moist.  Eyes:     Extraocular Movements: Extraocular movements intact.  Conjunctiva/sclera: Conjunctivae normal.     Pupils: Pupils are equal, round, and  reactive to light.  Cardiovascular:     Rate and Rhythm: Normal rate and regular rhythm.     Heart sounds: No murmur heard. Pulmonary:     Effort: Pulmonary effort is normal.     Breath sounds: Normal breath sounds. No rales.  Abdominal:     General: Bowel sounds are normal.     Palpations: Abdomen is soft.     Tenderness: There is no abdominal tenderness.  Musculoskeletal:        General: Tenderness present.     Cervical back: Normal range of motion and neck supple.     Right lower leg: No edema.     Left lower leg: No edema.     Comments: Right elbow in a hinged ROM brace, able to make a fist, pain at night, chronic neck pain-positional.   Skin:    General: Skin is warm and dry.     Comments: Chronic perineal/buttocks skin irritation from moist and heat  Lateral right corner of the eye scabbed over skin abrasion.    Neurological:     General: No focal deficit present.     Mental Status: He is alert and oriented to person, place, and time. Mental status is at baseline.     Gait: Gait abnormal.  Psychiatric:        Mood and Affect: Mood normal.        Behavior: Behavior normal.        Thought Content: Thought content normal.    Labs reviewed: Recent Labs    03/23/22 2255 03/30/22 0000 05/11/22 0800 11/12/22 0000 02/08/23 0000  NA 132*   < > 134* 134* 134*  K 4.1   < > 4.0 3.8 4.1  CL 101   < > 100 103 99  CO2 23   < > 27* 24* 25*  GLUCOSE 148*  --   --   --   --   BUN 16   < > 10 13 15   CREATININE 0.77   < > 0.6 0.5* 0.6  CALCIUM 8.5*   < > 8.8 8.6* 9.2   < > = values in this interval not displayed.   Recent Labs    11/12/22 0000 02/08/23 0000 02/15/23 0000  AST 21 24 25   ALT 17 22 20   ALKPHOS 134* 177* 134*  ALBUMIN 3.2* 3.8 3.5   Recent Labs    03/23/22 2255 03/30/22 0000 11/12/22 0000 02/08/23 0000  WBC 5.0 7.2 7.4 7.7  NEUTROABS 3.3  --   --   --   HGB 12.8* 12.7* 12.9* 14.7  HCT 38.5* 37* 39* 44  MCV 94.1  --   --   --   PLT 216 356 215 294    Lab Results  Component Value Date   TSH 2.039 11/02/2021   Lab Results  Component Value Date   HGBA1C 5.6 11/12/2022   Lab Results  Component Value Date   CHOL 79 11/12/2022   HDL 33 (A) 11/12/2022   LDLCALC 30 11/12/2022   LDLDIRECT 72.0 09/27/2017   TRIG 80 11/12/2022   CHOLHDL 2 10/14/2021    Significant Diagnostic Results in last 30 days:  No results found.  Assessment/Plan  Dysphagia choked in the recent past, chopped meat with thin fluid presently. PendingMBSS, pt's decision if further EGD for dilation or not, last GI told him he is high risk. CXR normal 03/07/23  Hyponatremia Na  132 03/08/23, pending f/u BMP one week.   Hyperlipidemia on Atorvastatin, LDL 30 11/12/22  Essential hypertension Blood pressure is controlled, continue Metoprolol   Neuropathy, peripheral Lower body weakness, mechanical lift for transfer, w/c for mobility, taking Gabapentin.   Hypokalemia K 3.5 03/08/23, continue Kcl.   GERD  thing liquid, hx of dilatation,  takes Pantoprazole, Hgb 13.5 03/08/23  Paroxysmal atrial fibrillation (HCC) off Eliquis 08/09/22 per cardiology, on  Metoprolol, Plavix, ASA  Diastolic dysfunction compensated  Anxiety failed effexor, stated sleeps well at night.    Family/ staff Communication: plan of care reviewed with the patient and charge nurse.   Labs/tests ordered:  pending f/u BMP  Time spend 30 minutes.

## 2023-03-09 NOTE — Assessment & Plan Note (Signed)
Blood pressure is controlled, continue Metoprolol. 

## 2023-03-09 NOTE — Assessment & Plan Note (Signed)
Lower body weakness, mechanical lift for transfer, w/c for mobility, taking Gabapentin.

## 2023-03-11 DIAGNOSIS — S079XXD Crushing injury of head, part unspecified, subsequent encounter: Secondary | ICD-10-CM | POA: Diagnosis not present

## 2023-03-14 DIAGNOSIS — S079XXD Crushing injury of head, part unspecified, subsequent encounter: Secondary | ICD-10-CM | POA: Diagnosis not present

## 2023-03-15 DIAGNOSIS — I10 Essential (primary) hypertension: Secondary | ICD-10-CM | POA: Diagnosis not present

## 2023-03-15 DIAGNOSIS — S079XXD Crushing injury of head, part unspecified, subsequent encounter: Secondary | ICD-10-CM | POA: Diagnosis not present

## 2023-03-15 LAB — BASIC METABOLIC PANEL
BUN: 10 (ref 4–21)
CO2: 26 — AB (ref 13–22)
Chloride: 100 (ref 99–108)
Creatinine: 0.4 — AB (ref 0.6–1.3)
Glucose: 85
Potassium: 4 meq/L (ref 3.5–5.1)
Sodium: 134 — AB (ref 137–147)

## 2023-03-15 LAB — COMPREHENSIVE METABOLIC PANEL
Calcium: 8.6 — AB (ref 8.7–10.7)
eGFR: 99

## 2023-03-17 DIAGNOSIS — S079XXD Crushing injury of head, part unspecified, subsequent encounter: Secondary | ICD-10-CM | POA: Diagnosis not present

## 2023-03-21 DIAGNOSIS — S079XXD Crushing injury of head, part unspecified, subsequent encounter: Secondary | ICD-10-CM | POA: Diagnosis not present

## 2023-03-22 DIAGNOSIS — S079XXD Crushing injury of head, part unspecified, subsequent encounter: Secondary | ICD-10-CM | POA: Diagnosis not present

## 2023-03-23 DIAGNOSIS — S079XXD Crushing injury of head, part unspecified, subsequent encounter: Secondary | ICD-10-CM | POA: Diagnosis not present

## 2023-03-25 DIAGNOSIS — S079XXD Crushing injury of head, part unspecified, subsequent encounter: Secondary | ICD-10-CM | POA: Diagnosis not present

## 2023-03-28 DIAGNOSIS — R1312 Dysphagia, oropharyngeal phase: Secondary | ICD-10-CM | POA: Diagnosis not present

## 2023-03-28 DIAGNOSIS — R29898 Other symptoms and signs involving the musculoskeletal system: Secondary | ICD-10-CM | POA: Diagnosis not present

## 2023-03-29 DIAGNOSIS — R29898 Other symptoms and signs involving the musculoskeletal system: Secondary | ICD-10-CM | POA: Diagnosis not present

## 2023-03-29 DIAGNOSIS — R1312 Dysphagia, oropharyngeal phase: Secondary | ICD-10-CM | POA: Diagnosis not present

## 2023-03-30 DIAGNOSIS — R29898 Other symptoms and signs involving the musculoskeletal system: Secondary | ICD-10-CM | POA: Diagnosis not present

## 2023-03-30 DIAGNOSIS — R1312 Dysphagia, oropharyngeal phase: Secondary | ICD-10-CM | POA: Diagnosis not present

## 2023-03-31 DIAGNOSIS — R29898 Other symptoms and signs involving the musculoskeletal system: Secondary | ICD-10-CM | POA: Diagnosis not present

## 2023-03-31 DIAGNOSIS — R1312 Dysphagia, oropharyngeal phase: Secondary | ICD-10-CM | POA: Diagnosis not present

## 2023-04-01 DIAGNOSIS — R29898 Other symptoms and signs involving the musculoskeletal system: Secondary | ICD-10-CM | POA: Diagnosis not present

## 2023-04-01 DIAGNOSIS — R1312 Dysphagia, oropharyngeal phase: Secondary | ICD-10-CM | POA: Diagnosis not present

## 2023-04-04 DIAGNOSIS — R29898 Other symptoms and signs involving the musculoskeletal system: Secondary | ICD-10-CM | POA: Diagnosis not present

## 2023-04-04 DIAGNOSIS — R1312 Dysphagia, oropharyngeal phase: Secondary | ICD-10-CM | POA: Diagnosis not present

## 2023-04-05 DIAGNOSIS — B372 Candidiasis of skin and nail: Secondary | ICD-10-CM | POA: Diagnosis not present

## 2023-04-05 DIAGNOSIS — L570 Actinic keratosis: Secondary | ICD-10-CM | POA: Diagnosis not present

## 2023-04-05 DIAGNOSIS — R1312 Dysphagia, oropharyngeal phase: Secondary | ICD-10-CM | POA: Diagnosis not present

## 2023-04-05 DIAGNOSIS — R29898 Other symptoms and signs involving the musculoskeletal system: Secondary | ICD-10-CM | POA: Diagnosis not present

## 2023-04-06 DIAGNOSIS — R29898 Other symptoms and signs involving the musculoskeletal system: Secondary | ICD-10-CM | POA: Diagnosis not present

## 2023-04-06 DIAGNOSIS — R1312 Dysphagia, oropharyngeal phase: Secondary | ICD-10-CM | POA: Diagnosis not present

## 2023-04-07 DIAGNOSIS — R29898 Other symptoms and signs involving the musculoskeletal system: Secondary | ICD-10-CM | POA: Diagnosis not present

## 2023-04-07 DIAGNOSIS — R1312 Dysphagia, oropharyngeal phase: Secondary | ICD-10-CM | POA: Diagnosis not present

## 2023-04-08 ENCOUNTER — Non-Acute Institutional Stay (SKILLED_NURSING_FACILITY): Payer: Self-pay | Admitting: Sports Medicine

## 2023-04-08 ENCOUNTER — Encounter: Payer: Self-pay | Admitting: Sports Medicine

## 2023-04-08 DIAGNOSIS — R1312 Dysphagia, oropharyngeal phase: Secondary | ICD-10-CM | POA: Diagnosis not present

## 2023-04-08 DIAGNOSIS — L309 Dermatitis, unspecified: Secondary | ICD-10-CM | POA: Diagnosis not present

## 2023-04-08 DIAGNOSIS — R29898 Other symptoms and signs involving the musculoskeletal system: Secondary | ICD-10-CM | POA: Diagnosis not present

## 2023-04-08 NOTE — Progress Notes (Unsigned)
Location:  [facility] Room Number: N065-A Place of Service:  SNF [phone]) Provider:  Venita Sheffield, MD    Patient Care Team: Nelwyn Salisbury, MD as PCP - General (Family Medicine) Lennette Bihari, MD as PCP - Cardiology (Cardiology) Ernesto Rutherford, MD as Consulting Physician (Ophthalmology) Luciana Axe Alford Highland, MD as Consulting Physician (Ophthalmology)  Extended Emergency Contact Information Primary Emergency Contact: Elspeth Cho [place] Home Phone: [phone] Mobile Phone: [phone] Relation: Daughter Secondary Emergency Contact: Doree Albee [place] Home Phone: [phone] Relation: Daughter  Code Status:  Full Code Goals of care: Advanced Directive information    04/08/2023   11:30 AM  Advanced Directives  Does Patient Have a Medical Advance Directive? Yes  Type of Estate agent of [place];Living will  Does patient want to make changes to medical advance directive? No - Patient declined  Copy of Healthcare Power of Attorney in Chart? Yes - validated most recent copy scanned in chart (See row information)     Chief Complaint  Patient presents with   Medical Management of Chronic Issues    Routine Visit   Immunizations    Covid   Health Maintenance    Foot Exam    HPI:  Pt is a 89 y.o. male seen today for medical management of chronic diseases.   GAD   As per nursing staff pt makes rude comments , inappropriate facial expressions and disrespectful towards the CNA  He was started on effexor but pt was very sleepy , daughter wants to stop the medication Pt reports that he frustrated with the staff that most of the time when he press call button he has to wait so long     Dysphagia  Had barium swallow test  Give meds whole (1) at a time with puree ie: applesauce, pudding, yogurt DIET: Mechanical soft with thin liquids, chin tuck with liquids, gravy with meat     Dermatitis-  Pt has chronic long standing history of dermatitis on his bottom  He follows with in house dermatology but they recommended biopsy  Facility having trouble finding outside referral with his insurance.  Neck pain  - pt c/o chronic neck pain  Currently doing physical therapy and states its helping him On tylenol 500 mg bid    Past Medical History:  Diagnosis Date   Arthritis    Diverticulosis of colon    External thrombosed hemorrhoids 06/11/2009   Qualifier: Diagnosis of  By: Clent Ridges MD, Tera Mater    GERD (gastroesophageal reflux disease)    Hypertension    Past Surgical History:  Procedure Laterality Date   cataract extaction, right  6/09   per Dr. Kennedy Bucker   CATARACT EXTRACTION Left    COLONOSCOPY  09/23/04   per Dr. Sherin Quarry, clear, no repeats needed    CORONARY STENT INTERVENTION N/A 06/06/2021   Procedure: CORONARY STENT INTERVENTION;  Surgeon: Yvonne Kendall, MD;  Location: [facility];  Service: Cardiovascular;  Laterality: N/A;   CORONARY/GRAFT ACUTE MI REVASCULARIZATION N/A 06/06/2021   Procedure: Coronary/Graft Acute MI Revascularization;  Surgeon: Yvonne Kendall, MD;  Location: [facility] CV [facility];  Service: Cardiovascular;  Laterality: N/A;   EGD and esophageal diatation,  05-10-11   per Dr. Christella Hartigan     HERNIA REPAIR     LEFT HEART CATH AND CORONARY ANGIOGRAPHY N/A 06/06/2021   Procedure: LEFT HEART CATH AND CORONARY ANGIOGRAPHY;  Surgeon: Yvonne Kendall, MD;  Location: [facility] CV [facility];  Service: Cardiovascular;  Laterality: N/A;   rt inguinal hernia     x2    Allergies  Allergen Reactions   Effexor [Venlafaxine]     Pt became lethargic, drowsy and not his usual self with effexor.   Contrast Media [Iodinated Contrast Media] Rash    Outpatient Encounter Medications as of 04/08/2023  Medication Sig   acetaminophen (TYLENOL) 325 MG tablet Take 650 mg by mouth every 6 (six) hours as needed.   aspirin EC 81 MG tablet Take 1 tablet (81 mg  total) by mouth daily. Swallow whole.   atorvastatin (LIPITOR) 40 MG tablet Take 1 tablet (40 mg total) by mouth daily.   clopidogrel (PLAVIX) 75 MG tablet Take 75 mg by mouth daily.   clotrimazole-betamethasone (LOTRISONE) cream Apply 1 Application topically 2 (two) times daily as needed. Fax#[phone]   docusate sodium (COLACE) 100 MG capsule Take 100 mg by mouth 2 (two) times daily.   fexofenadine (ALLEGRA) 180 MG tablet Take 180 mg by mouth daily.   gabapentin (NEURONTIN) 300 MG capsule TAKE 1 CAPSULE BY MOUTH THREE TIMES A DAY   Menthol, Topical Analgesic, (BIOFREEZE ROLL-ON) 4 % GEL Apply topically. Apply to neck topically two times a day   metoprolol succinate (TOPROL-XL) 25 MG 24 hr tablet Take 12.5 mg by mouth daily.   mometasone (ELOCON) 0.1 % cream Apply 1 Application topically daily.   nitroGLYCERIN (NITROSTAT) 0.4 MG SL tablet Place 1 tablet (0.4 mg total) under the tongue every 5 (five) minutes as needed for chest pain.   pantoprazole (PROTONIX) 40 MG tablet Take 1 tablet (40 mg total) by mouth daily.   polyvinyl alcohol (LIQUIFILM TEARS) 1.4 % ophthalmic solution Place 2 drops into the right eye 2 (two) times daily.   potassium chloride (KLOR-CON 10) 10 MEQ tablet Take 1 tablet (10 mEq total) by mouth daily.   pramoxine (CERAVE ITCH RELIEF) 1 % LOTN Apply 1 Application topically 2 (two) times daily.   No facility-administered encounter medications on file as of 04/08/2023.    Review of Systems  Constitutional:  Negative for fever.  Respiratory:  Negative for cough, shortness of breath and wheezing.   Cardiovascular:  Negative for chest pain, palpitations and leg swelling.  Gastrointestinal:  Negative for abdominal distention, abdominal pain, blood in stool, constipation, diarrhea, nausea and vomiting.  Genitourinary:  Negative for dysuria, frequency and urgency.  Musculoskeletal:  Positive for arthralgias and neck pain.  Skin:  Positive for rash.  Neurological:  Negative  for dizziness.  Psychiatric/Behavioral:  Positive for dysphoric mood.     Immunization History  Administered Date(s) Administered   Fluad Quad(high Dose 65+) 02/15/2022, 02/08/2023   Influenza Split 01/20/2012, 01/07/2013   Influenza Whole 02/09/2006, 01/11/2008   Influenza, High Dose Seasonal PF 12/30/2016, 12/27/2017, 01/08/2019   Influenza,inj,quad, With Preservative 12/29/2018   Influenza-Unspecified 01/17/2014, 01/14/2016, 12/30/2016   Moderna Covid-19 Fall Seasonal Vaccine 64yrs & older 03/22/2022, 02/09/2023   PFIZER Comirnaty(Gray Top)Covid-19 Tri-Sucrose Vaccine 05/28/2019, 03/04/2020, 09/23/2020   Pfizer Covid-19 Vaccine Bivalent Booster 46yrs & up 02/25/2022   Pneumococcal Conjugate-13 09/23/2015   Pneumococcal Polysaccharide-23 03/13/2008   Td 12/20/2007   Tdap 07/06/2021   Unspecified SARS-COV-2 Vaccination 04/30/2019   Zoster Recombinant(Shingrix) 03/11/2020, 06/20/2020   Pertinent  Health Maintenance Due  Topic Date Due   FOOT EXAM  03/09/2023   HEMOGLOBIN A1C  05/15/2023   INFLUENZA VACCINE  Completed   OPHTHALMOLOGY EXAM  Discontinued      05/04/2022    2:44 PM 06/01/2022  11:04 AM 06/15/2022    9:14 AM 06/23/2022    3:12 PM 10/19/2022   11:19 AM  Fall Risk  Falls in the past year? 1 1 1 1  Exclusion - non ambulatory  Was there an injury with Fall? 1 1 1 1    Was there an injury with Fall? - Comments    Fx Rt Ekbow. Followed by Orthopedic   Fall Risk Category Calculator 3 3 3 2    Fall Risk Category (Retired) High      (RETIRED) Patient Fall Risk Level High fall risk      Patient at Risk for Falls Due to History of fall(s);Impaired balance/gait;Impaired mobility;Impaired vision History of fall(s);Impaired balance/gait;Impaired mobility;Impaired vision History of fall(s);Impaired balance/gait;Impaired mobility Orthopedic patient   Fall risk Follow up Falls evaluation completed Falls evaluation completed Falls evaluation completed Falls prevention discussed Falls  evaluation completed   Functional Status Survey:    Vitals:   04/08/23 1124  BP: 139/68  Pulse: 83  Resp: (!) 21  Temp: 97.8 F (36.6 C)  SpO2: 97%  Weight: 155 lb 6.4 oz (70.5 kg)  Height: 5\' 7"  (1.702 m)   Body mass index is 24.34 kg/m. Physical Exam Constitutional:      Appearance: Normal appearance.  HENT:     Head: Normocephalic and atraumatic.  Cardiovascular:     Rate and Rhythm: Normal rate and regular rhythm.     Pulses: Normal pulses.     Heart sounds: Normal heart sounds.  Pulmonary:     Effort: No respiratory distress.     Breath sounds: No stridor. No wheezing or rales.  Abdominal:     General: Bowel sounds are normal. There is no distension.     Palpations: Abdomen is soft.     Tenderness: There is no abdominal tenderness. There is no right CVA tenderness or guarding.  Musculoskeletal:     Comments: 1+ lower extremity swelling both legs  Neck - no erythema , no spinal tenderness   Neurological:     Mental Status: He is alert. Mental status is at baseline.     Sensory: No sensory deficit.     Motor: No weakness.     Labs reviewed: Recent Labs    02/08/23 0000 03/08/23 0000 03/15/23 0000  NA 134* 132* 134*  K 4.1 3.5 4.0  CL 99 98* 100  CO2 25* 24* 26*  BUN 15 7 10   CREATININE 0.6 0.4* 0.4*  CALCIUM 9.2 8.7 8.6*   Recent Labs    11/12/22 0000 02/08/23 0000 02/15/23 0000  AST 21 24 25   ALT 17 22 20   ALKPHOS 134* 177* 134*  ALBUMIN 3.2* 3.8 3.5   Recent Labs    11/12/22 0000 02/08/23 0000 03/08/23 0000  WBC 7.4 7.7 8.1  HGB 12.9* 14.7 13.5  HCT 39* 44 40*  PLT 215 294 300   Lab Results  Component Value Date   TSH 2.039 11/02/2021   Lab Results  Component Value Date   HGBA1C 5.6 11/12/2022   Lab Results  Component Value Date   CHOL 79 11/12/2022   HDL 33 (A) 11/12/2022   LDLCALC 30 11/12/2022   LDLDIRECT 72.0 09/27/2017   TRIG 80 11/12/2022   CHOLHDL 2 10/14/2021    BASIC METABOLIC PANEL GLUCOSE 85 mg/dL 09-81  Final  Fasting reference interval UREA NITROGEN (BUN) 10 mg/dL 1-91 Final Verified by repeat analysis. CREATININE 0.41 mg/dL 4.78-2.95 L Final Verified by repeat analysis. EGFR 99 mL/min/1.73 m2 > OR = 60 Final  BUN/CREATININE RATIO 24 (calc) 6-22 H Final SODIUM 134 mmol/L 135-146 L Final POTASSIUM 4.0 mmol/L 3.5-5.3 Final CHLORIDE 100 mmol/L 98-110 Final CARBON DIOXIDE 26 mmol/L 20-32 Final CALCIUM 8.6 mg/dL 7.2-53.6 Final   CBC (H/H, RBC, INDICES, WBC, PLT) WHITE BLOOD CELL COUNT 8.1 Thousand/u L 3.8-10.8 Final RED BLOOD CELL COUNT 4.54 Million/uL 4.20-5.80 Final HEMOGLOBIN 13.5 g/dL 64.4-03.4 Final HEMATOCRIT 40.1 % 38.5-50.0 Final MCV 88.3 fL 80.0-100.0 Final MCH 29.7 pg 27.0-33.0 Final MCHC 33.7 g/dL 74.2-59.5 Final  Significant Diagnostic Results in last 30 days:  No results found.  Assessment/Plan  Dermatitis  Follows with dermatology in house who recommended biopsy  Trying to get outside referral with dermatology  Cont with nystatin, hydrocortisone, zinc oxide   Dysphagia  Had barium swallow test  On mechanical soft diet    Irritability  Staff reported irritability  Pt does not want to take any medicines to help with his mood  Neck pain  Cont with tylenol 500 mg twice daily  Cont with physical therapy     CAD  On plavix, lipitor   HLD  Cont with lipitor   GERD  Denies acid reflux Cont with omeprazole   HTN  139/86 Cont with metoprolol    Hypokalemia  Cont with potassium supplements    Family/ staff Communication: care plan discussed with the nursing staff   30 min Total time spent for obtaining history,  performing a medically appropriate examination and evaluation, reviewing the tests,  documenting clinical information in the electronic or other health record, independently interpreting results ,care coordination (not separately reported)

## 2023-04-11 ENCOUNTER — Encounter: Payer: Self-pay | Admitting: Sports Medicine

## 2023-04-11 DIAGNOSIS — R1312 Dysphagia, oropharyngeal phase: Secondary | ICD-10-CM | POA: Diagnosis not present

## 2023-04-11 DIAGNOSIS — R29898 Other symptoms and signs involving the musculoskeletal system: Secondary | ICD-10-CM | POA: Diagnosis not present

## 2023-04-12 ENCOUNTER — Ambulatory Visit: Payer: Medicare Other | Attending: Cardiovascular Disease | Admitting: Cardiovascular Disease

## 2023-04-12 ENCOUNTER — Encounter: Payer: Self-pay | Admitting: Cardiovascular Disease

## 2023-04-12 DIAGNOSIS — Z7901 Long term (current) use of anticoagulants: Secondary | ICD-10-CM | POA: Diagnosis not present

## 2023-04-12 DIAGNOSIS — E785 Hyperlipidemia, unspecified: Secondary | ICD-10-CM | POA: Diagnosis not present

## 2023-04-12 DIAGNOSIS — Z9861 Coronary angioplasty status: Secondary | ICD-10-CM | POA: Diagnosis not present

## 2023-04-12 DIAGNOSIS — Z79899 Other long term (current) drug therapy: Secondary | ICD-10-CM | POA: Diagnosis not present

## 2023-04-12 DIAGNOSIS — I4819 Other persistent atrial fibrillation: Secondary | ICD-10-CM | POA: Diagnosis not present

## 2023-04-12 DIAGNOSIS — S42401S Unspecified fracture of lower end of right humerus, sequela: Secondary | ICD-10-CM | POA: Diagnosis not present

## 2023-04-12 DIAGNOSIS — Z86711 Personal history of pulmonary embolism: Secondary | ICD-10-CM | POA: Insufficient documentation

## 2023-04-12 DIAGNOSIS — I2510 Atherosclerotic heart disease of native coronary artery without angina pectoris: Secondary | ICD-10-CM | POA: Insufficient documentation

## 2023-04-12 DIAGNOSIS — I480 Paroxysmal atrial fibrillation: Secondary | ICD-10-CM

## 2023-04-12 DIAGNOSIS — Z5181 Encounter for therapeutic drug level monitoring: Secondary | ICD-10-CM | POA: Insufficient documentation

## 2023-04-12 DIAGNOSIS — I2102 ST elevation (STEMI) myocardial infarction involving left anterior descending coronary artery: Secondary | ICD-10-CM | POA: Diagnosis not present

## 2023-04-12 DIAGNOSIS — I358 Other nonrheumatic aortic valve disorders: Secondary | ICD-10-CM | POA: Diagnosis not present

## 2023-04-12 MED ORDER — APIXABAN 5 MG PO TABS: 5.0000 mg | ORAL_TABLET | Freq: Two times a day (BID) | ORAL | 3 refills | Status: DC

## 2023-04-12 MED ORDER — METOPROLOL SUCCINATE ER 25 MG PO TB24: 12.5000 mg | ORAL_TABLET | Freq: Two times a day (BID) | ORAL | 3 refills | Status: DC

## 2023-04-12 NOTE — Patient Instructions (Addendum)
Medication Instructions:  STOP ASPIRIN  STOP PLAVIX  BEGIN ELIQUIS 5MG  TWICE DAILY  INCREASE METOPROLOL SUCCINATE 12.5MG  TWICE DAILY  *If you need a refill on your cardiac medications before your next appointment, please call your pharmacy*   Lab Work: No labs were ordered during today's visit.  If you have labs (blood work) drawn today and your tests are completely normal, you will receive your results only by: MyChart Message (if you have MyChart) OR A paper copy in the mail If you have any lab test that is abnormal or we need to change your treatment, we will call you to review the results.   Testing/Procedures: No procedures ordered today.    Follow-Up: At [facility], you and your health needs are our priority.  As part of our continuing mission to provide you with exceptional heart care, we have created designated Provider Care Teams.  These Care Teams include your primary Cardiologist (physician) and Advanced Practice Providers (APPs -  Physician Assistants and Nurse Practitioners) who all work together to provide you with the care you need, when you need it.  We recommend signing up for the patient portal called "MyChart".  Sign up information is provided on this After Visit Summary.  MyChart is used to connect with patients for Virtual Visits (Telemedicine).  Patients are able to view lab/test results, encounter notes, upcoming appointments, etc.  Non-urgent messages can be sent to your provider as well.   To learn more about what you can do with MyChart, go to [email or web address].    Your next appointment:   6 month(s)  Provider:   DR. Bjorn Pippin     A letter will be mailed to you as a reminder to call the office for your follow up appointment.

## 2023-04-12 NOTE — Progress Notes (Signed)
Cardiology Office Note    Date:  04/15/2023   ID:  [name], [name] [birth year], MRN [id number]  PCP:  Nelwyn Salisbury, MD  Cardiologist:  Nicki Guadalajara, MD   7 month F/U office visit   History of Present Illness:  Gabriel [name] is a 89 y.o. male who I saw on December 23, 2021 for an evaluation following his February 2023 hospitalization.  I last saw him on Sep 08, 2022.  He presents for a 78-month follow-up evaluation.    Gabriel [name] has a history of hypertension, GERD, who was hospitalized in February 2023 in the setting of ST segment elevation myocardial infarction.  He was found to have thrombotic 90% mid LAD stenosis with 99% subtotal occlusion of the mid distal LAD with otherwise mild nonobstructive CAD in the circumflex and with ectasia without significant stenosis in the RCA.  He underwent emergent catheterization by Dr. Okey Dupre and underwent successful DES stenting with nonoverlapping 3.0 x 12 mm and 2.0 x 15 mm Medtronic Onyx stents.  He was started on DAPT PT with aspirin and Brilinta.  An echo Doppler study showed EF 40 to 45% with apical anterior wall hypokinesis.  An initial ECG raised concern for possible A-fib but repeat ECG showed sinus bradycardia with occasional junctional beats.  DOAC was not started and he ultimately stabilized and was discharged on aspirin/Brilinta.  [facility] living.  Following his hospitalization, he apparently had fallen out of bed on several occasions and ultimately was transitioned to assisted living.  He has seen Bernadene Person, NP on 2 occasions following his hospitalization and last saw her on Sep 02, 2021.  At that time he was stable and continued to be on aspirin/Brilinta, metoprolol tartrate 25 mg twice a day, atorvastatin 40 mg daily, in addition to gabapentin and pantoprazole.  Apparently, he was hospitalized on November 02, 2021 after development of COVID with COVID-pneumonia.  CT angio of his  chest and was done which revealed lobar and segmental pulmonary emboli in branches of the right upper lobe with small overall clot burden. There was CT evidence for right heart strain.  There was mild subpleural patchy opacities in the lungs bilaterally, lower lobe predominant felt related to his COVID-pneumonia.  During that hospitalization, he was started on Eliquis and advised to stop taking aspirin but to continue ticagrelor.  An echo Doppler study on November 02, 2021 showed hyperdynamic LV function with EF at 65 to 70% without wall motion abnormalities.  There was grade 2 diastolic dysfunction.  He had mildly elevated PA systolic pressure 39.6 mm.  Left atrium was mildly dilated.  There was mild mitral regurgitation.  There was mild aortic sclerosis.  When I saw him on December 23, 2021 he was here with his daughter He has been living in [facility].  He walks with a walker and also rides a scooter.  He has been participating in physical therapy 3 days/week.  His primary provider is Dr. Gershon Crane.  He denied any chest tightness or awareness of palpitations, presyncope or syncope.  During that evaluation, I recommended that he discontinue ticagrelor since he was on Eliquis due to potential increased risk for bleed and recommended changing him to clopidogrel.  I suggested that perhaps after 6 months of his PE if he remains stable anticoagulation can be discontinued and he can be maintained on aspirin/Plavix.  I last saw  him on April 14, 2022.  Since his previous evaluation he had fallen and broke his elbow of his right arm on November 28.  He was in a cast.  Apparently, this happened late at the night when he was coming back to his room and was told by the staff that he needed to take a shower and when he went off the carpet to the hardwood floors his walker slipped and he fell.  During my evaluation, he denied any chest pain or shortness of breath, presyncope or syncope.  During my  evaluation, I recommended that in January 2024 since it will be 6 months following his pulmonary embolism he should undergo a follow-up CT to make certain that this was stable without residual thrombus.  If no thrombus was present my recommendation was for him to discontinue Eliquis and resume aspirin 81 mg with clopidogrel in light of his LAD stent.  I last saw him on Sep 08, 2022.  He underwent CT angio of his chest on May 12, 2022 which did not show any evidence for pulmonary embolism.  He had a large hiatal hernia, and there was dependent atelectasis in the posterior lungs bilaterally.  There is also evidence for cholelithiasis and diverticulosis at the splenic flexure of the colon.  He had scattered atherosclerotic calcification including the coronary arteries and aortic atherosclerosis.  Presently, he continues to be living at [facility].  He feels well.  Apparently, he continued to be on Eliquis which was not stopped by Dr. Hyacinth Meeker.  During that evaluation since his subsequent CT scan showed no evidence for PE and his significant fall risk we discussed discontinuance of Eliquis and that he continue to be on Plavix with the reinitiation of aspirin therapy.  He continued to be on atorvastatin 40 mg for hyperlipidemia and pantoprazole for GERD.  Presently, Gabriel [name].  He is no longer walking but uses a wheelchair all the time.  He is in skilled nursing.  Because of his significant right elbow shattering and stiffening he cannot use his arm to stabilize himself and walk with a walker.  Presently he denies any chest pain.  He is unaware of any rhythm disturbance.  He continues to be on aspirin/Plavix 75 mg daily, metoprolol succinate 12.5 mg daily, pantoprazole 40 mg, gabapentin 300 mg.  He presents for evaluation.   Past Medical History:  Diagnosis Date   Arthritis    Diverticulosis of colon    External thrombosed hemorrhoids 06/11/2009   Qualifier: Diagnosis  of  By: Clent Ridges MD, Tera Mater    GERD (gastroesophageal reflux disease)    Hypertension     Past Surgical History:  Procedure Laterality Date   cataract extaction, right  6/09   per Dr. Kennedy Bucker   CATARACT EXTRACTION Left    COLONOSCOPY  09/23/04   per Dr. Sherin Quarry, clear, no repeats needed    CORONARY STENT INTERVENTION N/A 06/06/2021   Procedure: CORONARY STENT INTERVENTION;  Surgeon: Yvonne Kendall, MD;  Location: [facility];  Service: Cardiovascular;  Laterality: N/A;   CORONARY/GRAFT ACUTE MI REVASCULARIZATION N/A 06/06/2021   Procedure: Coronary/Graft Acute MI Revascularization;  Surgeon: Yvonne Kendall, MD;  Location: [facility];  Service: Cardiovascular;  Laterality: N/A;   EGD and esophageal diatation,  05-10-11   per Dr. Christella Hartigan     HERNIA REPAIR     LEFT HEART CATH AND CORONARY ANGIOGRAPHY N/A 06/06/2021   Procedure: LEFT HEART CATH AND CORONARY ANGIOGRAPHY;  Surgeon: Yvonne Kendall, MD;  Location: [facility];  Service: Cardiovascular;  Laterality: N/A;   rt inguinal hernia     x2    Current Medications: Outpatient Medications Prior to Visit  Medication Sig Dispense Refill   acetaminophen (TYLENOL) 325 MG tablet Take 650 mg by mouth every 6 (six) hours as needed.     atorvastatin (LIPITOR) 40 MG tablet Take 1 tablet (40 mg total) by mouth daily. 90 tablet 3   clotrimazole-betamethasone (LOTRISONE) cream Apply 1 Application topically 2 (two) times daily as needed. Fax#[phone] 45 g 3   docusate sodium (COLACE) 100 MG capsule Take 100 mg by mouth 2 (two) times daily.     fexofenadine (ALLEGRA) 180 MG tablet Take 180 mg by mouth daily.     gabapentin (NEURONTIN) 300 MG capsule TAKE 1 CAPSULE BY MOUTH THREE TIMES A DAY 270 capsule 1   Menthol, Topical Analgesic, (BIOFREEZE ROLL-ON) 4 % GEL Apply topically. Apply to neck topically two times a day     mometasone (ELOCON) 0.1 % cream Apply 1 Application topically daily.     nitroGLYCERIN (NITROSTAT) 0.4 MG  SL tablet Place 1 tablet (0.4 mg total) under the tongue every 5 (five) minutes as needed for chest pain. 25 tablet 3   pantoprazole (PROTONIX) 40 MG tablet Take 1 tablet (40 mg total) by mouth daily. 30 tablet 0   polyvinyl alcohol (LIQUIFILM TEARS) 1.4 % ophthalmic solution Place 2 drops into the right eye 2 (two) times daily.     potassium chloride (KLOR-CON 10) 10 MEQ tablet Take 1 tablet (10 mEq total) by mouth daily. 30 tablet 0   pramoxine (CERAVE ITCH RELIEF) 1 % LOTN Apply 1 Application topically 2 (two) times daily.     aspirin EC 81 MG tablet Take 1 tablet (81 mg total) by mouth daily. Swallow whole. 90 tablet 3   clopidogrel (PLAVIX) 75 MG tablet Take 75 mg by mouth daily.     metoprolol succinate (TOPROL-XL) 25 MG 24 hr tablet Take 12.5 mg by mouth daily.     No facility-administered medications prior to visit.     Allergies:   Effexor [venlafaxine] and Contrast media [iodinated contrast media]   Social History   Socioeconomic History   Marital status: Widowed    Spouse name: Not on file   Number of children: 3   Years of education: Not on file   Highest education level: Not on file  Occupational History   Occupation: retired  Tobacco Use   Smoking status: Former    Current packs/day: 15.00    Average packs/day: 15.0 packs/day for 0.5 years (7.5 ttl pk-yrs)    Types: Cigarettes   Smokeless tobacco: Never   Tobacco comments:    smoked 18 and stopped and states he stopped 30's   Vaping Use   Vaping status: Never Used  Substance and Sexual Activity   Alcohol use: No    Alcohol/week: 0.0 standard drinks of alcohol   Drug use: No   Sexual activity: Never    Birth control/protection: Abstinence  Other Topics Concern   Not on file  Social History Narrative   Lives in studio at [facility]   Widowed since 2009, was married x 57 years.    3 daughters.    Social Drivers of Corporate investment banker Strain: Low Risk  (06/23/2022)   Overall Financial Resource  Strain (CARDIA)    Difficulty of Paying Living Expenses: Not hard at all  Food Insecurity:  No Food Insecurity (06/23/2022)   Hunger Vital Sign    Worried About Running Out of Food in the Last Year: Never true    Ran Out of Food in the Last Year: Never true  Transportation Needs: No Transportation Needs (06/23/2022)   PRAPARE - Administrator, Civil Service (Medical): No    Lack of Transportation (Non-Medical): No  Physical Activity: Sufficiently Active (06/23/2022)   Exercise Vital Sign    Days of Exercise per Week: 4 days    Minutes of Exercise per Session: 60 min  Stress: No Stress Concern Present (06/23/2022)   [employer] of Occupational Health - Occupational Stress Questionnaire    Feeling of Stress : Not at all  Social Connections: Moderately Integrated (06/23/2022)   Social Connection and Isolation Panel [NHANES]    Frequency of Communication with Friends and Family: More than three times a week    Frequency of Social Gatherings with Friends and Family: More than three times a week    Attends Religious Services: More than 4 times per year    Active Member of [employer] or Organizations: Yes    Attends Banker Meetings: More than 4 times per year    Marital Status: Widowed     Family History:  The patient's family history includes Coronary artery disease in his father.  His parents are deceased.  ROS General: Negative; No fevers, chills, or night sweats;  HEENT: Negative; No changes in vision or hearing, sinus congestion, difficulty swallowing Pulmonary: COVID-pneumonia with lobar and segmental pulmonary emboli in branches of the right upper lobe with small clot burden July 2023 Cardiovascular: See HPI GI: Negative; No nausea, vomiting, diarrhea, or abdominal pain GU: Negative; No dysuria, hematuria, or difficulty voiding Musculoskeletal: Right arm in cast Hematologic/Oncology: Negative; no easy bruising, bleeding Endocrine: Negative; no heat/cold  intolerance; no diabetes Neuro: Negative; no changes in balance, headaches Skin: Negative; No rashes or skin lesions Psychiatric: Negative; No behavioral problems, depression Sleep: Negative; No snoring, daytime sleepiness, hypersomnolence, bruxism, restless legs, hypnogognic hallucinations, no cataplexy Other comprehensive 14 point system review is negative.   PHYSICAL EXAM:   VS:  BP 128/76   Pulse 74   Ht 5' 7.5" (1.715 m)   Wt 155 lb (70.3 kg) Comment: Self-report  SpO2 98%   BMI 23.92 kg/m     Repeat blood pressure by me was 114/60  Wt Readings from Last 3 Encounters:  04/12/23 155 lb (70.3 kg)  04/08/23 155 lb 6.4 oz (70.5 kg)  03/09/23 158 lb 14.4 oz (72.1 kg)    General: Alert, oriented, no distress.  Skin: normal turgor, no rashes, warm and dry HEENT: Normocephalic, atraumatic. Pupils equal round and reactive to light; sclera anicteric; extraocular muscles intact;  Nose without nasal septal hypertrophy Mouth/Parynx benign; Mallinpatti scale Neck: No JVD, no carotid bruits; normal carotid upstroke Lungs: clear to ausculatation and percussion; no wheezing or rales Chest wall: without tenderness to palpitation Heart: PMI not displaced, irregularly irregular consistent with atrial fibrillation, s1 s2 normal, 1/6 systolic murmur, no diastolic murmur, no rubs, gallops, thrills, or heaves Abdomen: soft, nontender; no hepatosplenomehaly, BS+; abdominal aorta nontender and not dilated by palpation. Back: no CVA tenderness Pulses 2+ Musculoskeletal: full range of motion, normal strength, no joint deformities Extremities: Significant right elbow dislocation and bulging; no clubbing cyanosis or edema, Homan's sign negative  Neurologic: grossly nonfocal; Cranial nerves grossly wnl Psychologic: Normal mood and affect   Studies/Labs Reviewed:   EKG Interpretation Date/Time:  Tuesday  April 12 2023 10:33:25 EST Ventricular Rate:  74 PR Interval:    QRS Duration:  82 QT  Interval:  372 QTC Calculation: 412 R Axis:   -16  Text Interpretation: Atrial fibrillation with premature ventricular or aberrantly conducted complexes When compared with ECG of 04-Nov-2021 09:48, Atrial fibrillation has replaced Ectopic atrial rhythm QRS duration has decreased Non-specific change in ST segment in Lateral leads Confirmed by Nicki Guadalajara ([id number]) on 04/12/2023 11:30:50 AM    Sep 08, 2022 ECG (independently read by me): Sinus rhythm at 66, 1st degree AV block, isolate PVC   April 14, 2022 ECG (independently read by me): Junctional rhythm vs sinus with low voltage p wave at 65, PVCs; QS V1-2  December 23, 2021 ECG (independently read by me): Probable sinus rhythm at 60  QS V1-2  Recent Labs:    Latest Ref Rng & Units 03/15/2023   12:00 AM 03/08/2023   12:00 AM 02/08/2023   12:00 AM  BMP  BUN 4 - 21 10     7     15       Creatinine 0.6 - 1.3 0.4     0.4     0.6      Sodium 137 - 147 134     132     134      Potassium 3.5 - 5.1 mEq/L 4.0     3.5     4.1      Chloride 99 - 108 100     98     99      CO2 13 - 22 26     24     25       Calcium 8.7 - 10.7 8.6     8.7     9.2         This result is from an external source.        Latest Ref Rng & Units 02/15/2023   12:00 AM 02/08/2023   12:00 AM 11/12/2022   12:00 AM  Hepatic Function  Albumin 3.5 - 5.0 3.5     3.8     3.2      AST 14 - 40 25     24     21       ALT 10 - 40 U/L 20     22     17       Alk Phosphatase 25 - 125 134     177     134      Bilirubin, Direct 0.01 - 0.4 0.2           This result is from an external source.       Latest Ref Rng & Units 03/08/2023   12:00 AM 02/08/2023   12:00 AM 11/12/2022   12:00 AM  CBC  WBC  8.1     7.7     7.4      Hemoglobin 13.5 - 17.5 13.5     14.7     12.9      Hematocrit 41 - 53 40     44     39      Platelets 150 - 400 K/uL 300     294     215         This result is from an external source.   Lab Results  Component Value Date   MCV 94.1 03/23/2022    MCV 94.0 11/05/2021   MCV 93.0 11/04/2021  Lab Results  Component Value Date   TSH 2.039 11/02/2021   Lab Results  Component Value Date   HGBA1C 5.6 11/12/2022     BNP    Component Value Date/Time   BNP 368.7 (H) 11/03/2021 0537    ProBNP No results found for: "PROBNP"   Lipid Panel     Component Value Date/Time   CHOL 79 11/12/2022 0000   TRIG 80 11/12/2022 0000   HDL 33 (A) 11/12/2022 0000   CHOLHDL 2 10/14/2021 1141   VLDL 14.2 10/14/2021 1141   LDLCALC 30 11/12/2022 0000   LDLDIRECT 72.0 09/27/2017 0939     RADIOLOGY: No results found.   Additional studies/ records that were reviewed today include:   CATH/PCI: 06/06/2021 Conclusions: Severe single-vessel coronary artery disease with thrombotic 90% mid LAD stenosis and 99% subtotal occlusion of the distal LAD with TIMI-1 flow.  Mild, nonobstructive coronary artery disease noted in the LCx.  RCA is ectatic without significant stenosis. Normal left ventricular filling pressure (LVEDP ~7 mmHg). Successful PCI to mid and distal LAD using nonoverlapping Onyx Frontier 3.0 x 12 mm (mid LAD) and 2.0 x 15 mm (distal LAD) drug-eluting stents with 0% residual stenosis and TIMI-2 flow into the distal vessel.  Poor reflow most likely related to microvascular dysfunction from late presentation.   Recommendations: Admit to 2H-ICU for post STEMI monitoring/care. Complete 2 hours of cangrelor infusion. Dual antiplatelet therapy with aspirin and ticagrelor for at least 12 months.  If atrial fibrillation persists during the hospitalization, we may need to consider transitioning to clopidogrel plus NOAC prior to discharge. Gentle post-catheterization hydration, given normal LVEDP but likely reduced LVEF. Obtain echocardiogram. Aggressive secondary prevention of coronary artery disease.    Intervention    ASSESSMENT:    1. ST elevation myocardial infarction involving left anterior descending (LAD) coronary artery (HCC)    2. CAD S/P percutaneous coronary angioplasty   3. Persistent atrial fibrillation (HCC)   4. Hyperlipidemia LDL goal <70   5. Closed fracture dislocation of right elbow, sequela   6. History of pulmonary embolus (PE): July 2023   7. Alteration in anticoagulation   8. Medication management   9. Aortic valve sclerosis     PLAN:  GabrielMason is a very pleasant 89 year-old gentleman who has a history of hypertension, and hyperlipidemia who presented to [facility] on June 06, 2021 with a STEMI involving his LAD.  He underwent successful emergent cardiac catheterization and stenting of his proximal to mid and mid distal LAD with DES stents.  He was treated with DAPT with aspirin/Brilinta as well as lipid-lowering therapy and beta-blocker therapy.  Apparently, upon returning to [facility] independent living, he had fallen out his bed on several occasions and as result was transitioned to assisted living.  When  seen by Bernadene Person, NP in May 2023, he was maintaining sinus rhythm.  He  developed COVID-pneumonia in July 2023 and was admitted to the hospital on November 02, 2021.  An echo Doppler study revealed normalization of LV function with EF at 60 to 65% with grade 2 diastolic dysfunction, mildly dilated left atrium, mild MR, and mild aortic sclerosis.  A chest CT demonstrated findings consistent with pneumonia and he was started on steroids M&M severe placed and placed on oxygen.  A chest CT however demonstrated lobar and segmental pulmonary emboli in branches of the right upper lobe with small overall clot burden.  There was a suggestion of possible right heart strain.  Apparently during his  hospitalization he was started on Eliquis for anticoagulation, aspirin was discontinued, and he was told to maintain ticagrelor.  At my initial office visit with him, due to bleed risk I recommended discontinuance of ticagrelor and switched him to clopidogrel to take with eliquis and stay off aspirin.  At my  evaluation in December 2023 I recommended a 50-month follow-up CT scan, and if that was negative for PE particularly with his Brilinta and risk that Eliquis be discontinued and he will be switched back to aspirin/clopidogrel in light of his stent.  Apparently, his Eliquis was never discontinued by Dr. Hyacinth Meeker and his note in March 2024.  When seen by me in May 2024 Gabriel [name] it was 10 months following his PE.  His blood pressure was stable and he was maintaining sinus rhythm.  With his prior fall risk, I felt it was prudent to discontinue Eliquis and that he continue being on Plavix with the reinitiation of aspirin 81 mg.  Since I last saw him, he is now unable to use any support with his right arm due to his elbow fracture and dislocation.  As a result, he is no longer walking and is entirely wheelchair-bound.  Consequently, his fall risk is reduced.  Unfortunately, EKG today now shows that he is in atrial fibrillation.  He denies any chest pain or shortness of breath.  At times there is trace ankle edema.  Since it is now 22 months since his STEMI, with his recurrent AF, I have recommended he discontinue aspirin and Plavix and resume Eliquis, based on his creatinine and weight, and reinitiate 5 mg twice a day.  We discussed the risk benefits of anticoagulation versus antiplatelet therapy.  With him being back in atrial fibrillation, I am increasing metoprolol succinate to 25 mg daily and he can take this at 12.5 mg twice a day regimen.  [facility] with [facility].  He continues to be on atorvastatin 40 mg for hyperlipidemia.  He is on pantoprazole for GERD, and gabapentin for neuropathy.  I discussed with him my plans for nretirement in 2025.  I have recommended he be transition to the care of Dr. Anders Simmonds I will schedule a follow-up appointment in approximately 6 months.   Medication Adjustments/Labs and Tests Ordered: Current medicines are reviewed  at length with the patient today.  Concerns regarding medicines are outlined above.  Medication changes, Labs and Tests ordered today are listed in the Patient Instructions below. Patient Instructions  Medication Instructions:  STOP ASPIRIN  STOP PLAVIX  BEGIN ELIQUIS 5MG  TWICE DAILY  INCREASE METOPROLOL SUCCINATE 12.5MG  TWICE DAILY  *If you need a refill on your cardiac medications before your next appointment, please call your pharmacy*   Lab Work: No labs were ordered during today's visit.  If you have labs (blood work) drawn today and your tests are completely normal, you will receive your results only by: MyChart Message (if you have MyChart) OR A paper copy in the mail If you have any lab test that is abnormal or we need to change your treatment, we will call you to review the results.   Testing/Procedures: No procedures ordered today.    Follow-Up: At [facility], you and your health needs are our priority.  As part of our continuing mission to provide you with exceptional heart care, we have created designated Provider Care Teams.  These Care Teams include your primary Cardiologist (physician) and Advanced Practice Providers (  APPs -  Physician Assistants and Nurse Practitioners) who all work together to provide you with the care you need, when you need it.  We recommend signing up for the patient portal called "MyChart".  Sign up information is provided on this After Visit Summary.  MyChart is used to connect with patients for Virtual Visits (Telemedicine).  Patients are able to view lab/test results, encounter notes, upcoming appointments, etc.  Non-urgent messages can be sent to your provider as well.   To learn more about what you can do with MyChart, go to [email or web address].    Your next appointment:   6 month(s)  Provider:   DR. Bjorn Pippin     A letter will be mailed to you as a reminder to call the office for your follow up appointment.      Signed, Nicki Guadalajara, MD  04/15/2023 10:05 AM    [facility] [address], [address], [place], [place]  [address] Phone: [phone]

## 2023-04-13 DIAGNOSIS — R29898 Other symptoms and signs involving the musculoskeletal system: Secondary | ICD-10-CM | POA: Diagnosis not present

## 2023-04-13 DIAGNOSIS — R1312 Dysphagia, oropharyngeal phase: Secondary | ICD-10-CM | POA: Diagnosis not present

## 2023-04-14 DIAGNOSIS — R1312 Dysphagia, oropharyngeal phase: Secondary | ICD-10-CM | POA: Diagnosis not present

## 2023-04-14 DIAGNOSIS — R29898 Other symptoms and signs involving the musculoskeletal system: Secondary | ICD-10-CM | POA: Diagnosis not present

## 2023-04-15 ENCOUNTER — Encounter: Payer: Self-pay | Admitting: Cardiovascular Disease

## 2023-04-15 DIAGNOSIS — R29898 Other symptoms and signs involving the musculoskeletal system: Secondary | ICD-10-CM | POA: Diagnosis not present

## 2023-04-15 DIAGNOSIS — R1312 Dysphagia, oropharyngeal phase: Secondary | ICD-10-CM | POA: Diagnosis not present

## 2023-04-17 DIAGNOSIS — R29898 Other symptoms and signs involving the musculoskeletal system: Secondary | ICD-10-CM | POA: Diagnosis not present

## 2023-04-17 DIAGNOSIS — R1312 Dysphagia, oropharyngeal phase: Secondary | ICD-10-CM | POA: Diagnosis not present

## 2023-04-18 DIAGNOSIS — R29898 Other symptoms and signs involving the musculoskeletal system: Secondary | ICD-10-CM | POA: Diagnosis not present

## 2023-04-18 DIAGNOSIS — R1312 Dysphagia, oropharyngeal phase: Secondary | ICD-10-CM | POA: Diagnosis not present

## 2023-04-19 DIAGNOSIS — R1312 Dysphagia, oropharyngeal phase: Secondary | ICD-10-CM | POA: Diagnosis not present

## 2023-04-19 DIAGNOSIS — R29898 Other symptoms and signs involving the musculoskeletal system: Secondary | ICD-10-CM | POA: Diagnosis not present

## 2023-04-21 DIAGNOSIS — R29898 Other symptoms and signs involving the musculoskeletal system: Secondary | ICD-10-CM | POA: Diagnosis not present

## 2023-04-21 DIAGNOSIS — R1312 Dysphagia, oropharyngeal phase: Secondary | ICD-10-CM | POA: Diagnosis not present

## 2023-04-22 ENCOUNTER — Encounter: Payer: Self-pay | Admitting: Sports Medicine

## 2023-04-22 ENCOUNTER — Non-Acute Institutional Stay (SKILLED_NURSING_FACILITY): Payer: Self-pay | Admitting: Sports Medicine

## 2023-04-22 DIAGNOSIS — R0602 Shortness of breath: Secondary | ICD-10-CM

## 2023-04-22 DIAGNOSIS — R0981 Nasal congestion: Secondary | ICD-10-CM | POA: Diagnosis not present

## 2023-04-22 DIAGNOSIS — I4891 Unspecified atrial fibrillation: Secondary | ICD-10-CM | POA: Diagnosis not present

## 2023-04-22 NOTE — Progress Notes (Signed)
Location:  [facility] Room Number: N065-A Place of Service:  SNF [phone]) Provider:  Venita Sheffield, MD    Patient Care Team: Nelwyn Salisbury, MD as PCP - General (Family Medicine) Lennette Bihari, MD as PCP - Cardiology (Cardiology) Ernesto Rutherford, MD as Consulting Physician (Ophthalmology) Luciana Axe Alford Highland, MD as Consulting Physician (Ophthalmology)  Extended Emergency Contact Information Primary Emergency Contact: Elspeth Cho [place] Home Phone: [phone] Mobile Phone: [phone] Relation: Daughter Secondary Emergency Contact: Doree Albee [place] Home Phone: [phone] Relation: Daughter  Code Status:  Full Code Goals of care: Advanced Directive information    04/22/2023   10:18 AM  Advanced Directives  Does Patient Have a Medical Advance Directive? Yes  Type of Estate agent of [place];Living will  Does patient want to make changes to medical advance directive? No - Patient declined  Copy of Healthcare Power of Attorney in Chart? Yes - validated most recent copy scanned in chart (See row information)     Chief Complaint  Patient presents with   Acute Visit    Shortness of breath and wheezing    HPI:  Pt is a 89 y.o. male seen today for an acute visit for nursing report for sob and wheezing . Pt seen and examined in the living room. He is ambulating with power scooter. Able to speak in full sentences, does not appear to be in distress. Pt c/o mild shortness of breath and morning phlegm production, a symptom  he have experienced for years. Denies runny nose, sore throat, wheezing, chest pain, or sinus pain, fevers, dizziness, or abdominal pain.         Past Medical History:  Diagnosis Date   Arthritis    Diverticulosis of colon    External thrombosed hemorrhoids 06/11/2009   Qualifier: Diagnosis of  By: Clent Ridges MD, Tera Mater    GERD (gastroesophageal reflux disease)     Hypertension    Past Surgical History:  Procedure Laterality Date   cataract extaction, right  6/09   per Dr. Kennedy Bucker   CATARACT EXTRACTION Left    COLONOSCOPY  09/23/04   per Dr. Sherin Quarry, clear, no repeats needed    CORONARY STENT INTERVENTION N/A 06/06/2021   Procedure: CORONARY STENT INTERVENTION;  Surgeon: Yvonne Kendall, MD;  Location: [facility];  Service: Cardiovascular;  Laterality: N/A;   CORONARY/GRAFT ACUTE MI REVASCULARIZATION N/A 06/06/2021   Procedure: Coronary/Graft Acute MI Revascularization;  Surgeon: Yvonne Kendall, MD;  Location: [facility];  Service: Cardiovascular;  Laterality: N/A;   EGD and esophageal diatation,  05-10-11   per Dr. Christella Hartigan     HERNIA REPAIR     LEFT HEART CATH AND CORONARY ANGIOGRAPHY N/A 06/06/2021   Procedure: LEFT HEART CATH AND CORONARY ANGIOGRAPHY;  Surgeon: Yvonne Kendall, MD;  Location: [facility];  Service: Cardiovascular;  Laterality: N/A;   rt inguinal hernia     x2    Allergies  Allergen Reactions   Effexor [Venlafaxine]     Pt became lethargic, drowsy and not his usual self with effexor.   Contrast Media [Iodinated Contrast Media] Rash    Outpatient Encounter Medications as of 04/22/2023  Medication Sig   acetaminophen (TYLENOL) 325 MG tablet Take 650 mg by mouth every 6 (six) hours as needed.   apixaban (ELIQUIS) 5 MG TABS tablet Take 1 tablet (5 mg total) by mouth 2 (two) times daily.   atorvastatin (LIPITOR) 40 MG tablet Take  1 tablet (40 mg total) by mouth daily.   clotrimazole-betamethasone (LOTRISONE) cream Apply 1 Application topically 2 (two) times daily as needed. Fax#[phone]   docusate sodium (COLACE) 100 MG capsule Take 100 mg by mouth 2 (two) times daily.   fexofenadine (ALLEGRA) 180 MG tablet Take 180 mg by mouth daily.   gabapentin (NEURONTIN) 300 MG capsule TAKE 1 CAPSULE BY MOUTH THREE TIMES A DAY   Menthol, Topical Analgesic, (BIOFREEZE ROLL-ON) 4 % GEL Apply topically. Apply to neck  topically two times a day   metoprolol succinate (TOPROL XL) 25 MG 24 hr tablet Take 0.5 tablets (12.5 mg total) by mouth in the morning and at bedtime.   mometasone (ELOCON) 0.1 % cream Apply 1 Application topically daily.   nitroGLYCERIN (NITROSTAT) 0.4 MG SL tablet Place 1 tablet (0.4 mg total) under the tongue every 5 (five) minutes as needed for chest pain.   pantoprazole (PROTONIX) 40 MG tablet Take 1 tablet (40 mg total) by mouth daily.   polyvinyl alcohol (LIQUIFILM TEARS) 1.4 % ophthalmic solution Place 2 drops into the right eye 2 (two) times daily.   potassium chloride (KLOR-CON 10) 10 MEQ tablet Take 1 tablet (10 mEq total) by mouth daily.   pramoxine (CERAVE ITCH RELIEF) 1 % LOTN Apply 1 Application topically 2 (two) times daily.   No facility-administered encounter medications on file as of 04/22/2023.    Review of Systems  Immunization History  Administered Date(s) Administered   Fluad Quad(high Dose 65+) 02/15/2022, 02/08/2023   Influenza Split 01/20/2012, 01/07/2013   Influenza Whole 02/09/2006, 01/11/2008   Influenza, High Dose Seasonal PF 12/30/2016, 12/27/2017, 01/08/2019   Influenza,inj,quad, With Preservative 12/29/2018   Influenza-Unspecified 01/17/2014, 01/14/2016, 12/30/2016   Moderna Covid-19 Fall Seasonal Vaccine 78yrs & older 03/22/2022, 02/09/2023   PFIZER Comirnaty(Gray Top)Covid-19 Tri-Sucrose Vaccine 05/28/2019, 03/04/2020, 09/23/2020   Pfizer Covid-19 Vaccine Bivalent Booster 16yrs & up 02/25/2022   Pneumococcal Conjugate-13 09/23/2015   Pneumococcal Polysaccharide-23 03/13/2008   Td 12/20/2007   Tdap 07/06/2021   Unspecified SARS-COV-2 Vaccination 04/30/2019   Zoster Recombinant(Shingrix) 03/11/2020, 06/20/2020   Pertinent  Health Maintenance Due  Topic Date Due   FOOT EXAM  03/09/2023   HEMOGLOBIN A1C  05/15/2023   INFLUENZA VACCINE  Completed   OPHTHALMOLOGY EXAM  Discontinued      05/04/2022    2:44 PM 06/01/2022   11:04 AM 06/15/2022    9:14  AM 06/23/2022    3:12 PM 10/19/2022   11:19 AM  Fall Risk  Falls in the past year? 1 1 1 1  Exclusion - non ambulatory  Was there an injury with Fall? 1 1 1 1    Was there an injury with Fall? - Comments    Fx Rt Ekbow. Followed by Orthopedic   Fall Risk Category Calculator 3 3 3 2    Fall Risk Category (Retired) High      (RETIRED) Patient Fall Risk Level High fall risk      Patient at Risk for Falls Due to History of fall(s);Impaired balance/gait;Impaired mobility;Impaired vision History of fall(s);Impaired balance/gait;Impaired mobility;Impaired vision History of fall(s);Impaired balance/gait;Impaired mobility Orthopedic patient   Fall risk Follow up Falls evaluation completed Falls evaluation completed Falls evaluation completed Falls prevention discussed Falls evaluation completed   Functional Status Survey:    Vitals:   04/22/23 1016  BP: 137/68  Pulse: 70  Resp: (!) 21  Temp: 97.8 F (36.6 C)  SpO2: 97%  Weight: 155 lb 6.4 oz (70.5 kg)  Height: 5' 7.5" (1.715 m)  Body mass index is 23.98 kg/m. Physical Exam Constitutional:      Appearance: Normal appearance.  HENT:     Head: Normocephalic and atraumatic.     Nose:     Comments: No sinus tenderness  Cardiovascular:     Rate and Rhythm: Normal rate. Rhythm irregular.     Pulses: Normal pulses.     Heart sounds: Normal heart sounds.  Pulmonary:     Effort: No respiratory distress.     Breath sounds: No stridor. No wheezing or rales.  Abdominal:     General: Bowel sounds are normal. There is no distension.     Palpations: Abdomen is soft.     Tenderness: There is no abdominal tenderness. There is no right CVA tenderness or guarding.  Neurological:     Mental Status: He is alert. Mental status is at baseline.     Sensory: No sensory deficit.     Motor: No weakness.     Labs reviewed: Recent Labs    02/08/23 0000 03/08/23 0000 03/15/23 0000  NA 134* 132* 134*  K 4.1 3.5 4.0  CL 99 98* 100  CO2 25* 24* 26*   BUN 15 7 10   CREATININE 0.6 0.4* 0.4*  CALCIUM 9.2 8.7 8.6*   Recent Labs    11/12/22 0000 02/08/23 0000 02/15/23 0000  AST 21 24 25   ALT 17 22 20   ALKPHOS 134* 177* 134*  ALBUMIN 3.2* 3.8 3.5   Recent Labs    11/12/22 0000 02/08/23 0000 03/08/23 0000  WBC 7.4 7.7 8.1  HGB 12.9* 14.7 13.5  HCT 39* 44 40*  PLT 215 294 300   Lab Results  Component Value Date   TSH 2.039 11/02/2021   Lab Results  Component Value Date   HGBA1C 5.6 11/12/2022   Lab Results  Component Value Date   CHOL 79 11/12/2022   HDL 33 (A) 11/12/2022   LDLCALC 30 11/12/2022   LDLDIRECT 72.0 09/27/2017   TRIG 80 11/12/2022   CHOLHDL 2 10/14/2021    Significant Diagnostic Results in last 30 days:  No results found.  Assessment/Plan  1. SOB (shortness of breath) (Primary) Pt does not appear to be in distress Able to speak in full sentences Lungs clear  Sat 97% on RA  Will monitor    2. Nasal congestion No sinus tenderness Will start flonase  Cont with claritin   Afib  Rate controlled Pt followed with cardiology recently  Cont with metoprolol , eliquis    Care plan discussed with nursing staff    30 min Total time spent for obtaining history,  performing a medically appropriate examination and evaluation, reviewing the tests,   documenting clinical information in the electronic or other health record, independently interpreting results ,care coordination (not separately reported)

## 2023-04-25 DIAGNOSIS — R1312 Dysphagia, oropharyngeal phase: Secondary | ICD-10-CM | POA: Diagnosis not present

## 2023-04-25 DIAGNOSIS — R29898 Other symptoms and signs involving the musculoskeletal system: Secondary | ICD-10-CM | POA: Diagnosis not present

## 2023-04-27 DIAGNOSIS — R29898 Other symptoms and signs involving the musculoskeletal system: Secondary | ICD-10-CM | POA: Diagnosis not present

## 2023-04-27 DIAGNOSIS — R1312 Dysphagia, oropharyngeal phase: Secondary | ICD-10-CM | POA: Diagnosis not present

## 2023-04-28 ENCOUNTER — Encounter: Payer: Self-pay | Admitting: Sports Medicine

## 2023-04-28 ENCOUNTER — Non-Acute Institutional Stay (SKILLED_NURSING_FACILITY): Payer: Medicare Other | Admitting: Sports Medicine

## 2023-04-28 DIAGNOSIS — M25473 Effusion, unspecified ankle: Secondary | ICD-10-CM

## 2023-04-28 DIAGNOSIS — R0981 Nasal congestion: Secondary | ICD-10-CM | POA: Diagnosis not present

## 2023-04-28 NOTE — Progress Notes (Signed)
 Provider: Jackalyn Blazing  MD Location:   [facility]   Place of Service:   Skilled care  PCP: Johnny Garnette LABOR, MD Patient Care Team: Johnny Garnette LABOR, MD as PCP - General (Family Medicine) Burnard Debby LABOR, MD as PCP - Cardiology (Cardiology) Octavia Charleston, MD as Consulting Physician (Ophthalmology) Elner Arley LABOR, MD as Consulting Physician (Ophthalmology)  Extended Emergency Contact Information Primary Emergency Contact: Garrett,Cathy  [place] Home Phone: [phone] Mobile Phone: [phone] Relation: Daughter Secondary Emergency Contact: Leia Pulling  [place] Home Phone: [phone] Relation: Daughter  Code Status:  Goals of Care: Advanced Directive information    04/22/2023   10:18 AM  Advanced Directives  Does Patient Have a Medical Advance Directive? Yes  Type of Estate Agent of [place];Living will  Does patient want to make changes to medical advance directive? No - Patient declined  Copy of Healthcare Power of Attorney in Chart? Yes - validated most recent copy scanned in chart (See row information)      No chief complaint on file.   HPI: Patient is a 89 y.o. male seen today for acute visit for ankle swelling and nasal congestion  Ankle swelling  Patient reports ankle swelling for the past 3 to 4 days and increased weight gain Denies exertional shortness of breath, orthopnea, PND, chest pain Patient ambulates with power scooter and keeps his feet down majority of the time.   Nasal congestion- Patient complains of nasal congestion mostly in the morning Denies runny nose, sore throat, sinus pain.  Past Medical History:  Diagnosis Date   Arthritis    Diverticulosis of colon    External thrombosed hemorrhoids 06/11/2009   Qualifier: Diagnosis of  By: Johnny MD, Garnette LABOR    GERD (gastroesophageal reflux disease)    Hypertension    Past Surgical History:  Procedure Laterality Date    cataract extaction, right  6/09   per Dr. Lorrene   CATARACT EXTRACTION Left    COLONOSCOPY  09/23/04   per Dr. Nolon, clear, no repeats needed    CORONARY STENT INTERVENTION N/A 06/06/2021   Procedure: CORONARY STENT INTERVENTION;  Surgeon: Mady Bruckner, MD;  [facility]: [facility];  Service: Cardiovascular;  Laterality: N/A;   CORONARY/GRAFT ACUTE MI REVASCULARIZATION N/A 06/06/2021   Procedure: Coronary/Graft Acute MI Revascularization;  Surgeon: Mady Bruckner, MD;  [facility]: [facility];  Service: Cardiovascular;  Laterality: N/A;   EGD and esophageal diatation,  05-10-11   per Dr. Teressa     HERNIA REPAIR     LEFT HEART CATH AND CORONARY ANGIOGRAPHY N/A 06/06/2021   Procedure: LEFT HEART CATH AND CORONARY ANGIOGRAPHY;  Surgeon: Mady Bruckner, MD;  [facility]: [facility];  Service: Cardiovascular;  Laterality: N/A;   rt inguinal hernia     x2    reports that he has quit smoking. His smoking use included cigarettes. He has a 7.5 pack-year smoking history. He has never used smokeless tobacco. He reports that he does not drink alcohol and does not use drugs. Social History   Socioeconomic History   Marital status: Widowed    Spouse name: Not on file   Number of children: 3   Years of education: Not on file   Highest education level: Not on file  Occupational History   Occupation: retired  Tobacco Use   Smoking status: Former    Current packs/day: 15.00    Average packs/day: 15.0 packs/day for 0.5 years (7.5 ttl pk-yrs)  Types: Cigarettes   Smokeless tobacco: Never   Tobacco comments:    smoked 18 and stopped and states he stopped 30's   Vaping Use   Vaping status: Never Used  Substance and Sexual Activity   Alcohol use: No    Alcohol/week: 0.0 standard drinks of alcohol   Drug use: No   Sexual activity: Never    Birth control/protection: Abstinence  Other Topics Concern   Not on file  Social History Narrative   Lives in studio at [facility]   Widowed since 2009, was married x 57 years.    3 daughters.    Social Drivers of Corporate Investment Banker Strain: Low Risk  (06/23/2022)   Overall Financial Resource Strain (CARDIA)    Difficulty of Paying Living Expenses: Not hard at all  Food Insecurity: No Food Insecurity (06/23/2022)   Hunger Vital Sign    Worried About Running Out of Food in the Last Year: Never true    Ran Out of Food in the Last Year: Never true  Transportation Needs: No Transportation Needs (06/23/2022)   PRAPARE - Administrator, Civil Service (Medical): No    Lack of Transportation (Non-Medical): No  Physical Activity: Sufficiently Active (06/23/2022)   Exercise Vital Sign    Days of Exercise per Week: 4 days    Minutes of Exercise per Session: 60 min  Stress: No Stress Concern Present (06/23/2022)   [employer] of Occupational Health - Occupational Stress Questionnaire    Feeling of Stress : Not at all  Social Connections: Moderately Integrated (06/23/2022)   Social Connection and Isolation Panel [NHANES]    Frequency of Communication with Friends and Family: More than three times a week    Frequency of Social Gatherings with Friends and Family: More than three times a week    Attends Religious Services: More than 4 times per year    Active Member of [employer] or Organizations: Yes    Attends Banker Meetings: More than 4 times per year    Marital Status: Widowed  Intimate Partner Violence: Not At Risk (06/23/2022)   Humiliation, Afraid, Rape, and Kick questionnaire    Fear of Current or Ex-Partner: No    Emotionally Abused: No    Physically Abused: No    Sexually Abused: No    Functional Status Survey:    Family History  Problem Relation Age of Onset   Coronary artery disease Father    Colon cancer Neg Hx    Stomach cancer Neg Hx    Esophageal cancer Neg Hx    Pancreatic cancer Neg Hx     Health Maintenance  Topic Date Due   FOOT EXAM  03/09/2023    COVID-19 Vaccine (7 - 2024-25 season) 04/06/2023   Medicare Annual Wellness (AWV)  06/24/2023   HEMOGLOBIN A1C  05/15/2023   DTaP/Tdap/Td (3 - Td or Tdap) 07/07/2031   Pneumonia Vaccine 17+ Years old  Completed   INFLUENZA VACCINE  Completed   Zoster Vaccines- Shingrix  Completed   HPV VACCINES  Aged Out   OPHTHALMOLOGY EXAM  Discontinued    Allergies  Allergen Reactions   Effexor [Venlafaxine]     Pt became lethargic, drowsy and not his usual self with effexor.   Contrast Media [Iodinated Contrast Media] Rash    Outpatient Encounter Medications as of 04/28/2023  Medication Sig   acetaminophen  (TYLENOL ) 325 MG tablet Take 650 mg by mouth every 6 (six) hours as needed.  apixaban  (ELIQUIS ) 5 MG TABS tablet Take 1 tablet (5 mg total) by mouth 2 (two) times daily.   atorvastatin  (LIPITOR) 40 MG tablet Take 1 tablet (40 mg total) by mouth daily.   clotrimazole -betamethasone  (LOTRISONE ) cream Apply 1 Application topically 2 (two) times daily as needed. Fax#[phone]   docusate sodium (COLACE) 100 MG capsule Take 100 mg by mouth 2 (two) times daily.   fexofenadine  (ALLEGRA ) 180 MG tablet Take 180 mg by mouth daily.   gabapentin  (NEURONTIN ) 300 MG capsule TAKE 1 CAPSULE BY MOUTH THREE TIMES A DAY   Menthol, Topical Analgesic, (BIOFREEZE ROLL-ON) 4 % GEL Apply topically. Apply to neck topically two times a day   metoprolol  succinate (TOPROL  XL) 25 MG 24 hr tablet Take 0.5 tablets (12.5 mg total) by mouth in the morning and at bedtime.   mometasone (ELOCON) 0.1 % cream Apply 1 Application topically daily.   nitroGLYCERIN  (NITROSTAT ) 0.4 MG SL tablet Place 1 tablet (0.4 mg total) under the tongue every 5 (five) minutes as needed for chest pain.   pantoprazole  (PROTONIX ) 40 MG tablet Take 1 tablet (40 mg total) by mouth daily.   polyvinyl alcohol (LIQUIFILM TEARS) 1.4 % ophthalmic solution Place 2 drops into the right eye 2 (two) times daily.   potassium chloride  (KLOR-CON  10) 10 MEQ tablet  Take 1 tablet (10 mEq total) by mouth daily.   pramoxine (CERAVE ITCH RELIEF) 1 % LOTN Apply 1 Application topically 2 (two) times daily.   No facility-administered encounter medications on file as of 04/28/2023.    Review of Systems  Constitutional:  Negative for fever.  HENT:  Negative for sore throat.   Respiratory:  Negative for cough, shortness of breath and wheezing.   Cardiovascular:  Positive for leg swelling. Negative for chest pain and palpitations.  Gastrointestinal:  Negative for abdominal distention, abdominal pain, blood in stool, constipation, diarrhea, nausea and vomiting.  Genitourinary:  Negative for dysuria and frequency.  Neurological:  Negative for dizziness and numbness.    There were no vitals filed for this visit. There is no height or weight on file to calculate BMI. Physical Exam Constitutional:      Appearance: Normal appearance.  HENT:     Head: Normocephalic and atraumatic.  Cardiovascular:     Rate and Rhythm: Normal rate and regular rhythm.     Pulses: Normal pulses.     Heart sounds: Normal heart sounds.  Pulmonary:     Effort: No respiratory distress.     Breath sounds: No stridor. No wheezing or rales.  Abdominal:     General: Bowel sounds are normal. There is no distension.     Palpations: Abdomen is soft.     Tenderness: There is no abdominal tenderness. There is no right CVA tenderness or guarding.  Musculoskeletal:        General: Swelling present.     Comments: 2+ pitting odema   Neurological:     Mental Status: He is alert. Mental status is at baseline.     Sensory: No sensory deficit.     Motor: No weakness.     Labs reviewed: Basic Metabolic Panel: Recent Labs    02/08/23 0000 03/08/23 0000 03/15/23 0000  NA 134* 132* 134*  K 4.1 3.5 4.0  CL 99 98* 100  CO2 25* 24* 26*  BUN 15 7 10   CREATININE 0.6 0.4* 0.4*  CALCIUM  9.2 8.7 8.6*   Liver Function Tests: Recent Labs    11/12/22 0000 02/08/23 0000 02/15/23 0000  AST  21 24 25   ALT 17 22 20   ALKPHOS 134* 177* 134*  ALBUMIN 3.2* 3.8 3.5   No results for input(s): LIPASE, AMYLASE in the last 8760 hours. No results for input(s): AMMONIA in the last 8760 hours. CBC: Recent Labs    11/12/22 0000 02/08/23 0000 03/08/23 0000  WBC 7.4 7.7 8.1  HGB 12.9* 14.7 13.5  HCT 39* 44 40*  PLT 215 294 300   Cardiac Enzymes: No results for input(s): CKTOTAL, CKMB, CKMBINDEX, TROPONINI in the last 8760 hours. BNP: Invalid input(s): POCBNP Lab Results  Component Value Date   HGBA1C 5.6 11/12/2022   Lab Results  Component Value Date   TSH 2.039 11/02/2021   Lab Results  Component Value Date   VITAMINB12 417 11/12/2022   No results found for: FOLATE Lab Results  Component Value Date   FERRITIN 256 11/03/2021    Imaging and Procedures obtained prior to SNF admission: CT Angio Chest Pulmonary Embolism (PE) W or WO Contrast Result Date: 05/12/2022 CLINICAL DATA:  Pleural effusion, history of blood clotting disorder, question pulmonary embolism EXAM: CT ANGIOGRAPHY CHEST WITH CONTRAST TECHNIQUE: Multidetector CT imaging of the chest was performed using the standard protocol during bolus administration of intravenous contrast. Multiplanar CT image reconstructions and MIPs were obtained to evaluate the vascular anatomy. Due to history of contrast allergy patient was premedicated for contrast administration. Patient suffered no reaction to IV contrast. RADIATION DOSE REDUCTION: This exam was performed according to the departmental dose-optimization program which includes automated exposure control, adjustment of the mA and/or kV according to patient size and/or use of iterative reconstruction technique. CONTRAST:  75mL OMNIPAQUE  IOHEXOL  350 MG/ML SOLN IV COMPARISON:  11/03/2021 FINDINGS: Cardiovascular: Atherosclerotic calcifications aorta, proximal great vessels, and coronary arteries. Aorta normal caliber without aneurysm or dissection. No  pericardial effusion. Minimal scattered pericardial calcification noted. Pulmonary arteries adequately opacified and patent. No evidence of pulmonary embolism. Mediastinum/Nodes: Large hiatal hernia. Esophagus unremarkable. Base of cervical region normal appearance. No thoracic adenopathy. Lungs/Pleura: Dependent atelectasis in the posterior lungs bilaterally. Lungs otherwise clear. No pulmonary infiltrate, pleural effusion, or pneumothorax. Upper Abdomen: Cholelithiasis. Simple appearing cyst medial upper RIGHT kidney 3.0 x 2.5 cm; no follow-up imaging recommended. Diverticulosis at splenic flexure of colon. Musculoskeletal: No acute osseous findings. Review of the MIP images confirms the above findings. IMPRESSION: No evidence of pulmonary embolism. Large hiatal hernia. Dependent atelectasis in the posterior lungs bilaterally. Cholelithiasis. Diverticulosis at splenic flexure of colon. Scattered atherosclerotic calcifications including coronary arteries. Aortic Atherosclerosis (ICD10-I70.0). Electronically Signed   By: Oneil Kiss M.D.   On: 05/12/2022 15:55    Assessment/Plan  1. Ankle swelling, unspecified laterality (Primary) Pt reports ankle swelling  Lungs clear Instructed patient to keep feet elevated Use compression stockings Cont with lasix  20 mg daily x 5 days Avoid salt intake   2. Nasal congestion  No sinus tenderness Will start flonase    Care plan discussed with the nursing staff   30 min Total time spent for obtaining history,  performing a medically appropriate examination and evaluation, reviewing the tests, documenting clinical information in the electronic or other health record, independently interpreting results ,care coordination (not separately reported)

## 2023-04-29 DIAGNOSIS — R29898 Other symptoms and signs involving the musculoskeletal system: Secondary | ICD-10-CM | POA: Diagnosis not present

## 2023-04-29 DIAGNOSIS — R1312 Dysphagia, oropharyngeal phase: Secondary | ICD-10-CM | POA: Diagnosis not present

## 2023-05-01 DIAGNOSIS — R29898 Other symptoms and signs involving the musculoskeletal system: Secondary | ICD-10-CM | POA: Diagnosis not present

## 2023-05-01 DIAGNOSIS — R1312 Dysphagia, oropharyngeal phase: Secondary | ICD-10-CM | POA: Diagnosis not present

## 2023-05-02 DIAGNOSIS — R29898 Other symptoms and signs involving the musculoskeletal system: Secondary | ICD-10-CM | POA: Diagnosis not present

## 2023-05-02 DIAGNOSIS — R1312 Dysphagia, oropharyngeal phase: Secondary | ICD-10-CM | POA: Diagnosis not present

## 2023-05-03 DIAGNOSIS — R29898 Other symptoms and signs involving the musculoskeletal system: Secondary | ICD-10-CM | POA: Diagnosis not present

## 2023-05-03 DIAGNOSIS — R1312 Dysphagia, oropharyngeal phase: Secondary | ICD-10-CM | POA: Diagnosis not present

## 2023-05-04 DIAGNOSIS — R29898 Other symptoms and signs involving the musculoskeletal system: Secondary | ICD-10-CM | POA: Diagnosis not present

## 2023-05-04 DIAGNOSIS — R1312 Dysphagia, oropharyngeal phase: Secondary | ICD-10-CM | POA: Diagnosis not present

## 2023-05-05 ENCOUNTER — Ambulatory Visit (INDEPENDENT_AMBULATORY_CARE_PROVIDER_SITE_OTHER): Payer: Medicare Other | Admitting: Dermatology

## 2023-05-05 ENCOUNTER — Encounter: Payer: Self-pay | Admitting: Dermatology

## 2023-05-05 DIAGNOSIS — L89152 Pressure ulcer of sacral region, stage 2: Secondary | ICD-10-CM

## 2023-05-05 DIAGNOSIS — L309 Dermatitis, unspecified: Secondary | ICD-10-CM

## 2023-05-05 DIAGNOSIS — L89309 Pressure ulcer of unspecified buttock, unspecified stage: Secondary | ICD-10-CM

## 2023-05-05 DIAGNOSIS — L89319 Pressure ulcer of right buttock, unspecified stage: Secondary | ICD-10-CM

## 2023-05-05 DIAGNOSIS — L304 Erythema intertrigo: Secondary | ICD-10-CM

## 2023-05-05 DIAGNOSIS — R1312 Dysphagia, oropharyngeal phase: Secondary | ICD-10-CM | POA: Diagnosis not present

## 2023-05-05 DIAGNOSIS — R29898 Other symptoms and signs involving the musculoskeletal system: Secondary | ICD-10-CM | POA: Diagnosis not present

## 2023-05-05 MED ORDER — NYSTATIN 100000 UNIT/GM EX POWD: 1.0000 | Freq: Three times a day (TID) | CUTANEOUS | 3 refills | Status: DC

## 2023-05-05 MED ORDER — FLUOCINONIDE 0.05 % EX CREA: 1.0000 | TOPICAL_CREAM | Freq: Two times a day (BID) | CUTANEOUS | 4 refills | Status: DC

## 2023-05-05 NOTE — Progress Notes (Signed)
   New Patient Visit   Subjective  Gabriel [name] is a 89 y.o. male who presents for the following: chronic pressure related skin changes of the bottom. He is accompanied by his daughter. Patient fell and broke his arm in November 2023 and was bound a wheelchair since. He does not stand on his own. He has developed this chronic rash on his bottom, since this event occurred. He has previously been seen by his PCP and a Dermatology PA and was previously given mometasone cream and zinc barrier cream and clotrimazole -betamethasone  cream, with no improvement.  The following portions of the chart were reviewed this encounter and updated as appropriate: medications, allergies, medical history  Review of Systems:  No other skin or systemic complaints except as noted in HPI or Assessment and Plan.  Objective  Well appearing patient in no apparent distress; mood and affect are within normal limits.  A focused examination was performed of the following areas:  Groin and buttocks  Relevant exam findings are noted in the Assessment and Plan.    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 The ulcer appears red, with a shallow open wound, and there is minimal drainage noted. The surrounding skin shows signs of erythema, but there is no evidence of infection at this time. The patient is at risk for further skin breakdown due to limited mobility, and efforts to relieve pressure on the area, including frequent repositioning and the use of pressure-relieving devices, were discussed. A wound care regimen was initiated, including appropriate dressings to protect the ulcer and promote healing. The patient and caregiver were educated on the importance of repositioning every two hours, maintaining good  hygiene, and ensuring adequate nutrition for optimal healing. Follow-up care will be scheduled to monitor the wound's progress and adjust treatment as necessary. - Previously failed clotrimazole /betamethasone  combination, mometasone cream  Will send fluocinonide  cream to use BID PRN to red inflamed itchy skin Continue zinc oxide barrier cream  INTERTRIGO Exam: Erythematous macerated patches in body folds on buttocks Flared  Intertrigo is a chronic recurrent rash that occurs in skin fold areas that may be associated with friction; heat; moisture; yeast; fungus; and bacteria.  It is exacerbated by increased movement / activity; sweating; and higher atmospheric temperature.  Use of an absorbant powder such as Zeasorb AF powder or other OTC antifungal powder to the area daily can prevent rash recurrence. Other options to help keep the area dry include blow drying the area after bathing or using antiperspirant products such as Duradry sweat minimizing gel.  Treatment Plan: - Nystatin  Powder  Return if symptoms worsen or fail to improve.  IBerwyn Baseman, Surg Tech III, am acting as scribe for RUFUS CHRISTELLA HOLY, MD.   We spent 45 min reviewing records, taking the patient history, providing face to face care with the patient, sending prescriptions.  Documentation: I have reviewed the above documentation for accuracy and completeness, and I agree with the above.  RUFUS CHRISTELLA HOLY, MD

## 2023-05-05 NOTE — Patient Instructions (Addendum)
 Apply medicines in the following order:   -Nystatin  powder to bottom, three times daily -Fluocinonide  cream to bottom twice daily for itching and redness -Zinc oxide barrier cream to bottom daily   Pressure Ulcer Care: Patient and Caregiver Handout  Pressure ulcers (also known as bedsores or pressure sores) are injuries to the skin and underlying tissue that occur when sustained pressure reduces blood flow to specific areas of the body. They are most common in individuals who are bedridden, have limited mobility, or are unable to reposition themselves regularly. Proper care is crucial to promote healing, prevent complications, and avoid further damage. This handout provides important information on pressure ulcer care.  1. Understanding Pressure Ulcers Pressure ulcers can develop on areas of the body where bones are close to the skin, such as:  Heels Sacrum (lower back/bottom area) Hips Elbows Shoulders Ankles These ulcers develop when prolonged pressure cuts off blood flow to the skin and underlying tissue, causing cell death. They are classified in stages based on their severity:  Stage 1: Red, intact skin; no open wound. Stage 2: Partial loss of skin, forming a shallow open wound or blister. Stage 3: Full-thickness loss of skin, extending into the deeper tissues. Stage 4: Extensive tissue loss, involving muscle, bone, or tendons.  2. General Pressure Ulcer Care Guidelines Keep the ulcer clean and dry: Follow the instructions provided by your healthcare provider for cleaning and dressing the wound. Use mild soap and water to cleanse the area. Avoid scrubbing, as it can damage delicate tissue. Change dressings regularly: Change the dressing as recommended by your healthcare provider to keep the wound clean and free of infection. Some wounds may require more frequent changes than others. Monitor for signs of infection: Watch for increased redness, swelling, warmth, drainage, or an  unpleasant odor, which could indicate infection. Seek medical help if these symptoms occur. Pain management: Pain from a pressure ulcer can be managed with medications as prescribed. If the ulcer is painful, discuss pain relief strategies with your healthcare provider.  3. Positioning and Repositioning Reposition regularly: Repositioning helps relieve pressure on vulnerable areas of the body. If possible, change position every 2 hours if you are lying in bed and every 15 minutes if sitting in a chair. Use support surfaces: Special mattresses, cushions, or pads that redistribute pressure can help reduce the risk of pressure ulcer formation and promote healing. Talk to your healthcare provider about appropriate devices. Avoid direct pressure on the ulcer: Ensure that the pressure ulcer is not directly compressed when repositioning. Avoid lying on the ulcer area and use cushions or pads for additional support.  4. Nutrition for Healing Adequate nutrition is key: A well-balanced diet plays an important role in the healing process. Ensure you are getting enough protein, vitamins (especially vitamin C and zinc), and fluids to support tissue repair. Hydration: Drinking plenty of fluids helps maintain skin moisture and supports the body's healing processes. Aim for 6-8 glasses of water per day, unless otherwise directed by your healthcare provider.  5. Special Wound Care for Each Stage Stage 1 (Red, intact skin): Maintain good skin hygiene, use moisturizers to keep skin hydrated, and relieve pressure on the affected area. Stage 2 (Partial-thickness loss): Clean with mild soap and water, use appropriate dressings (such as hydrocolloid or foam dressings), and monitor for any signs of infection. Stage 3 and 4 (Full-thickness loss): These wounds require more intensive care. Use advanced wound care products as recommended by your healthcare provider (e.g., hydrocolloids, alginates, or foam  dressings). If the wound  has large amounts of necrotic tissue, debridement may be necessary.  6. When to Contact Your Healthcare Provider If you notice increased redness, swelling, or drainage from the ulcer. If you experience signs of infection, such as fever, worsening pain, or an unusual odor. If the wound shows no signs of healing or worsens despite appropriate care. If you need guidance on repositioning, dressing changes, or other aspects of care.  7. Prevention of Future Pressure Ulcers Reposition frequently: Ensure that you or your caregiver reposition the body at regular intervals to relieve pressure on the skin. Use protective barriers: Use cushions, pads, or specialized mattresses to reduce pressure on bony prominences. Check skin daily: Regularly inspect the skin, especially in areas prone to pressure ulcers, for early signs of redness or irritation.  8. Support and Resources Caregiver support: Caregivers play a vital role in pressure ulcer care, particularly with repositioning and ensuring proper nutrition and hydration. If you're a caregiver, don't hesitate to reach out for additional resources or training. Follow-up care: Regular follow-up appointments with your healthcare provider are essential to monitor wound healing and make adjustments to care plans as necessary.  Conclusion Pressure ulcer care requires consistent effort and attention, but with proper care and preventive measures, healing is possible, and the risk of further complications can be minimized. Follow these guidelines, maintain open communication with your healthcare provider, and don't hesitate to ask for help when needed.

## 2023-05-06 DIAGNOSIS — R1312 Dysphagia, oropharyngeal phase: Secondary | ICD-10-CM | POA: Diagnosis not present

## 2023-05-06 DIAGNOSIS — R29898 Other symptoms and signs involving the musculoskeletal system: Secondary | ICD-10-CM | POA: Diagnosis not present

## 2023-05-08 DIAGNOSIS — R1312 Dysphagia, oropharyngeal phase: Secondary | ICD-10-CM | POA: Diagnosis not present

## 2023-05-08 DIAGNOSIS — R29898 Other symptoms and signs involving the musculoskeletal system: Secondary | ICD-10-CM | POA: Diagnosis not present

## 2023-05-09 DIAGNOSIS — R29898 Other symptoms and signs involving the musculoskeletal system: Secondary | ICD-10-CM | POA: Diagnosis not present

## 2023-05-09 DIAGNOSIS — R1312 Dysphagia, oropharyngeal phase: Secondary | ICD-10-CM | POA: Diagnosis not present

## 2023-05-10 ENCOUNTER — Non-Acute Institutional Stay (SKILLED_NURSING_FACILITY): Payer: Self-pay | Admitting: Nurse Practitioner

## 2023-05-10 ENCOUNTER — Encounter: Payer: Self-pay | Admitting: Nurse Practitioner

## 2023-05-10 DIAGNOSIS — R1310 Dysphagia, unspecified: Secondary | ICD-10-CM | POA: Diagnosis not present

## 2023-05-10 DIAGNOSIS — F419 Anxiety disorder, unspecified: Secondary | ICD-10-CM

## 2023-05-10 DIAGNOSIS — I509 Heart failure, unspecified: Secondary | ICD-10-CM | POA: Diagnosis not present

## 2023-05-10 DIAGNOSIS — G609 Hereditary and idiopathic neuropathy, unspecified: Secondary | ICD-10-CM

## 2023-05-10 DIAGNOSIS — I480 Paroxysmal atrial fibrillation: Secondary | ICD-10-CM | POA: Diagnosis not present

## 2023-05-10 DIAGNOSIS — R600 Localized edema: Secondary | ICD-10-CM

## 2023-05-10 DIAGNOSIS — R0602 Shortness of breath: Secondary | ICD-10-CM

## 2023-05-10 DIAGNOSIS — I2510 Atherosclerotic heart disease of native coronary artery without angina pectoris: Secondary | ICD-10-CM | POA: Diagnosis not present

## 2023-05-10 DIAGNOSIS — J90 Pleural effusion, not elsewhere classified: Secondary | ICD-10-CM | POA: Diagnosis not present

## 2023-05-10 DIAGNOSIS — L570 Actinic keratosis: Secondary | ICD-10-CM | POA: Diagnosis not present

## 2023-05-10 DIAGNOSIS — Z9861 Coronary angioplasty status: Secondary | ICD-10-CM

## 2023-05-10 DIAGNOSIS — R1312 Dysphagia, oropharyngeal phase: Secondary | ICD-10-CM | POA: Diagnosis not present

## 2023-05-10 DIAGNOSIS — I10 Essential (primary) hypertension: Secondary | ICD-10-CM | POA: Diagnosis not present

## 2023-05-10 DIAGNOSIS — K219 Gastro-esophageal reflux disease without esophagitis: Secondary | ICD-10-CM | POA: Diagnosis not present

## 2023-05-10 DIAGNOSIS — R29898 Other symptoms and signs involving the musculoskeletal system: Secondary | ICD-10-CM | POA: Diagnosis not present

## 2023-05-10 DIAGNOSIS — I5189 Other ill-defined heart diseases: Secondary | ICD-10-CM | POA: Diagnosis not present

## 2023-05-10 DIAGNOSIS — E876 Hypokalemia: Secondary | ICD-10-CM | POA: Diagnosis not present

## 2023-05-10 DIAGNOSIS — Z86711 Personal history of pulmonary embolism: Secondary | ICD-10-CM | POA: Diagnosis not present

## 2023-05-10 LAB — COMPREHENSIVE METABOLIC PANEL
Albumin: 3.3 — AB (ref 3.5–5.0)
Calcium: 8.6 — AB (ref 8.7–10.7)
Globulin: 2.4

## 2023-05-10 LAB — HEPATIC FUNCTION PANEL
ALT: 22 U/L (ref 10–40)
AST: 25 (ref 14–40)
Alkaline Phosphatase: 129 — AB (ref 25–125)
Bilirubin, Total: 1.1

## 2023-05-10 LAB — BASIC METABOLIC PANEL
BUN: 9 (ref 4–21)
CO2: 24 — AB (ref 13–22)
Chloride: 98 — AB (ref 99–108)
Creatinine: 0.5 — AB (ref 0.6–1.3)
Glucose: 112
Potassium: 3.7 meq/L (ref 3.5–5.1)
Sodium: 131 — AB (ref 137–147)

## 2023-05-10 LAB — CBC: RBC: 4.13 (ref 3.87–5.11)

## 2023-05-10 LAB — CBC AND DIFFERENTIAL
HCT: 37 — AB (ref 41–53)
Hemoglobin: 12.3 — AB (ref 13.5–17.5)
Neutrophils Absolute: 7344
Platelets: 249 10*3/uL (ref 150–400)
WBC: 9

## 2023-05-10 NOTE — Assessment & Plan Note (Signed)
 Lower body weakness, motorized w/c for mobility, taking Gabapentin.

## 2023-05-10 NOTE — Progress Notes (Addendum)
 Location:   S[facility] Room Number: 76 Place of Service:  SNF (31) Provider: [facility] Trask Vosler NP  Johnny Garnette LABOR, MD  Patient Care Team: Johnny Garnette LABOR, MD as PCP - General (Family Medicine) Burnard Debby LABOR, MD as PCP - Cardiology (Cardiology) Octavia Charleston, MD as Consulting Physician (Ophthalmology) Elner Arley LABOR, MD as Consulting Physician (Ophthalmology)  Extended Emergency Contact Information Primary Emergency Contact: Honora Donny [place] Home Phone: [phone] Mobile Phone: [phone] Relation: Daughter Secondary Emergency Contact: Leia Pulling  [place] Home Phone: [phone] Relation: Daughter  Code Status:  DNR Goals of care: Advanced Directive information    04/22/2023   10:18 AM  Advanced Directives  Does Patient Have a Medical Advance Directive? Yes  Type of Estate Agent of [place];Living will  Does patient want to make changes to medical advance directive? No - Patient declined  Copy of Healthcare Power of Attorney in Chart? Yes - validated most recent copy scanned in chart (See row information)     Chief Complaint  Patient presents with   Medical Management of Chronic Issues    HPI:  Pt is a 89 y.o. male seen today for medical management of chronic diseases.    Discussed the use of AI scribe software for clinical note transcription with the patient, who gave verbal consent to proceed.  History of Present Illness          The patient presents with a cough and dysphagia. The cough started a few days ago, accompanied by congestion. The patient reports that the congestion clears up after using a nasal spray, which they believe is breaking it loose. The cough is described as very loose and has been ongoing for about two to three days. Taking Claritin daily.   The patient also reports difficulty swallowing, a condition known as dysphagia. They describe the sensation of food stopping in  their throat, not in the esophagus. This has been an ongoing issue for about a year and seems to worsen at night. Drinking fluids helps to break it loose, and they were able to consume a bowl of oatmeal, a cup of coffee, cranberry juice, and a boost for breakfast.  In addition to these symptoms, the patient has noticed swelling in their legs, which started about two to three weeks ago. They have been managing this with socks. Treated with 5 days of Furosemide  04/28/23  The patient denies chest pain, palpitations, nocturnal dyspnea, fever, chills, nausea, vomiting, constipation, and diarrhea. Their appetite is good. Dysphagia, choked in the past, chopped meat with thin fluid presently. Underwent GI evaluation. CXR normal 03/07/23             Anxiety, failed effexor, stated sleeps well at night.              The right arm pain, more at night, only wants Tylenol  at night.               Chronic pressure areas in sacral/buttocks region, on and off, pressure reduction with frequent bed rest through out day helped.  03/24/23 right elbow fx(distal end of right humerus), mild swelling fingers, pain is well not controlled, in hinged elbow ROM brace during day, therapy is going to try different brace at night, has been to surgeon-non surgical candidate. The patient desires delaying X-ray.              CAD ST elevation MI LAD 05/2021, underwent Cath, stenting, s/p percutaneous coronary angioplasty,  started DAPT, Echo EF 40-45%,  65-70% 11/02/21, started Plavix  12/24/21 per [facility]             PE 11/02/21, had CTA 05/12/22, 09/08/22 Cardiology dc Eliquis , start ASA 81mg  qd, continue Plavix  75mg  qd. Repeated CT @ 6 mos,  negative PE             Grade 2 diastolic dysfunction, swelling BLE, SOB, placed on Furosemide  in setting of weight gained.              Afib, off Eliquis  08/09/22 per cardiology, on  Metoprolol , Plavix , ASA, off Eliquis  08/09/22 per cardiology, on  Metoprolol , Plavix , ASA             GERD, thing liquid,  hx of dilatation,  takes Pantoprazole , Hgb 13.5 03/08/23             Hypokalemia, on Kcl K 3.5 03/08/23             Hyponatremia, Na 134 03/15/23             HLD on Atorvastatin , LDL 30 11/12/22             HTN, on Metoprolol              Peripheral neuropathy, on Gabapentin    Past Medical History:  Diagnosis Date   Arthritis    Diverticulosis of colon    External thrombosed hemorrhoids 06/11/2009   Qualifier: Diagnosis of  By: Johnny MD, Garnette LABOR    GERD (gastroesophageal reflux disease)    Hypertension    Past Surgical History:  Procedure Laterality Date   cataract extaction, right  6/09   per Dr. Lorrene   CATARACT EXTRACTION Left    COLONOSCOPY  09/23/04   per Dr. Nolon, clear, no repeats needed    CORONARY STENT INTERVENTION N/A 06/06/2021   Procedure: CORONARY STENT INTERVENTION;  Surgeon: Mady Bruckner, MD;  Location: [facility];  Service: Cardiovascular;  Laterality: N/A;   CORONARY/GRAFT ACUTE MI REVASCULARIZATION N/A 06/06/2021   Procedure: Coronary/Graft Acute MI Revascularization;  Surgeon: Mady Bruckner, MD;  Location: [facility];  Service: Cardiovascular;  Laterality: N/A;   EGD and esophageal diatation,  05-10-11   per Dr. Teressa     HERNIA REPAIR     LEFT HEART CATH AND CORONARY ANGIOGRAPHY N/A 06/06/2021   Procedure: LEFT HEART CATH AND CORONARY ANGIOGRAPHY;  Surgeon: Mady Bruckner, MD;  Location: [facility];  Service: Cardiovascular;  Laterality: N/A;   rt inguinal hernia     x2    Allergies  Allergen Reactions   Effexor [Venlafaxine]     Pt became lethargic, drowsy and not his usual self with effexor.   Contrast Media [Iodinated Contrast Media] Rash    Allergies as of 05/10/2023       Reactions   Effexor [venlafaxine]    Pt became lethargic, drowsy and not his usual self with effexor.   Contrast Media [iodinated Contrast Media] Rash        Medication List        Accurate as of May 10, 2023  3:05 PM. If you have  any questions, ask your nurse or doctor.          acetaminophen  325 MG tablet Commonly known as: TYLENOL  Take 650 mg by mouth every 6 (six) hours as needed.   apixaban  5 MG Tabs tablet Commonly known as: ELIQUIS  Take 1 tablet (5 mg total) by mouth 2 (two) times daily.  atorvastatin  40 MG tablet Commonly known as: LIPITOR Take 1 tablet (40 mg total) by mouth daily.   Biofreeze Roll-On 4 % Gel Generic drug: Menthol (Topical Analgesic) Apply topically. Apply to neck topically two times a day   CeraVe Itch Relief 1 % Lotn Generic drug: pramoxine Apply 1 Application topically 2 (two) times daily.   clotrimazole -betamethasone  cream Commonly known as: LOTRISONE  Apply 1 Application topically 2 (two) times daily as needed. Fax#[phone]   docusate sodium 100 MG capsule Commonly known as: COLACE Take 100 mg by mouth 2 (two) times daily.   fexofenadine  180 MG tablet Commonly known as: ALLEGRA  Take 180 mg by mouth daily.   fluocinonide  cream 0.05 % Commonly known as: LIDEX  Apply 1 Application topically 2 (two) times daily.   fluticasone  50 MCG/ACT nasal spray Commonly known as: FLONASE  Place 2 sprays into both nostrils daily.   gabapentin  300 MG capsule Commonly known as: NEURONTIN  TAKE 1 CAPSULE BY MOUTH THREE TIMES A DAY   metoprolol  succinate 25 MG 24 hr tablet Commonly known as: Toprol  XL Take 0.5 tablets (12.5 mg total) by mouth in the morning and at bedtime.   mometasone 0.1 % cream Commonly known as: ELOCON Apply 1 Application topically daily.   nitroGLYCERIN  0.4 MG SL tablet Commonly known as: Nitrostat  Place 1 tablet (0.4 mg total) under the tongue every 5 (five) minutes as needed for chest pain.   nystatin  powder Commonly known as: MYCOSTATIN /NYSTOP  Apply 1 Application topically 3 (three) times daily.   pantoprazole  40 MG tablet Commonly known as: PROTONIX  Take 1 tablet (40 mg total) by mouth daily.   polyvinyl alcohol 1.4 % ophthalmic  solution Commonly known as: LIQUIFILM TEARS Place 2 drops into the right eye 2 (two) times daily.   potassium chloride  10 MEQ tablet Commonly known as: Klor-Con  10 Take 1 tablet (10 mEq total) by mouth daily.        Review of Systems  Constitutional:  Negative for appetite change, fatigue and fever.  HENT:  Positive for congestion, hearing loss, postnasal drip, rhinorrhea and trouble swallowing. Negative for sinus pressure, sinus pain, sore throat and voice change.   Eyes:  Negative for visual disturbance.  Respiratory:  Positive for cough and shortness of breath. Negative for chest tightness and wheezing.   Cardiovascular:  Positive for leg swelling.  Gastrointestinal:  Negative for abdominal pain and constipation.  Genitourinary:  Negative for dysuria, frequency and urgency.       Incontinent of urine  Musculoskeletal:  Positive for arthralgias and gait problem. Negative for joint swelling.       R arm pain.   Skin:  Negative for color change.  Neurological:  Negative for speech difficulty, weakness and headaches.  Psychiatric/Behavioral:  Negative for confusion and sleep disturbance. The patient is not nervous/anxious.     Immunization History  Administered Date(s) Administered   Fluad Quad(high Dose 65+) 02/15/2022, 02/08/2023   Influenza Split 01/20/2012, 01/07/2013   Influenza Whole 02/09/2006, 01/11/2008   Influenza, High Dose Seasonal PF 12/30/2016, 12/27/2017, 01/08/2019   Influenza,inj,quad, With Preservative 12/29/2018   Influenza-Unspecified 01/17/2014, 01/14/2016, 12/30/2016   Moderna Covid-19 Fall Seasonal Vaccine 61yrs & older 03/22/2022, 02/09/2023   PFIZER Comirnaty(Gray Top)Covid-19 Tri-Sucrose Vaccine 05/28/2019, 03/04/2020, 09/23/2020   Pfizer Covid-19 Vaccine Bivalent Booster 62yrs & up 02/25/2022   Pneumococcal Conjugate-13 09/23/2015   Pneumococcal Polysaccharide-23 03/13/2008   Td 12/20/2007   Tdap 07/06/2021   Unspecified SARS-COV-2 Vaccination  04/30/2019   Zoster Recombinant(Shingrix) 03/11/2020, 06/20/2020   Pertinent  Health  Maintenance Due  Topic Date Due   FOOT EXAM  03/09/2023   HEMOGLOBIN A1C  05/15/2023   INFLUENZA VACCINE  Completed   OPHTHALMOLOGY EXAM  Discontinued      05/04/2022    2:44 PM 06/01/2022   11:04 AM 06/15/2022    9:14 AM 06/23/2022    3:12 PM 10/19/2022   11:19 AM  Fall Risk  Falls in the past year? 1 1 1 1  Exclusion - non ambulatory  Was there an injury with Fall? 1 1 1 1    Was there an injury with Fall? - Comments    Fx Rt [place]. Followed by Orthopedic   Fall Risk Category Calculator 3 3 3 2    Fall Risk Category (Retired) High      (RETIRED) Patient Fall Risk Level High fall risk      Patient at Risk for Falls Due to History of fall(s);Impaired balance/gait;Impaired mobility;Impaired vision History of fall(s);Impaired balance/gait;Impaired mobility;Impaired vision History of fall(s);Impaired balance/gait;Impaired mobility Orthopedic patient   Fall risk Follow up Falls evaluation completed Falls evaluation completed Falls evaluation completed Falls prevention discussed Falls evaluation completed   Functional Status Survey:    Vitals:   05/10/23 1032  BP: 138/69  Pulse: 74  Resp: 20  Temp: 98.1 F (36.7 C)  SpO2: 96%  Weight: 176 lb 4.8 oz (80 kg)   Body mass index is 27.2 kg/m. Physical Exam Vitals and nursing note reviewed.  Constitutional:      Appearance: Normal appearance.  HENT:     Head: Normocephalic.     Comments:      Nose: Congestion and rhinorrhea present.     Mouth/Throat:     Mouth: Mucous membranes are moist.  Eyes:     Extraocular Movements: Extraocular movements intact.     Conjunctiva/sclera: Conjunctivae normal.     Pupils: Pupils are equal, round, and reactive to light.  Cardiovascular:     Rate and Rhythm: Normal rate. Rhythm irregular.     Heart sounds: No murmur heard. Pulmonary:     Effort: Pulmonary effort is normal.     Breath sounds: Rales present.      Comments: Bibasilar rales. Central congestion Abdominal:     General: Bowel sounds are normal.     Palpations: Abdomen is soft.     Tenderness: There is no abdominal tenderness.  Musculoskeletal:        General: Tenderness present.     Cervical back: Normal range of motion and neck supple.     Right lower leg: Edema present.     Left lower leg: Edema present.     Comments: Right elbow in a hinged ROM brace, able to make a fist, pain at night, chronic neck pain-positional.  Swelling BLE 1-2+  Skin:    General: Skin is warm and dry.     Comments: Chronic perineal/buttocks skin irritation from moist and heat  Lateral right corner of the eye scabbed over skin abrasion.    Neurological:     General: No focal deficit present.     Mental Status: He is alert and oriented to person, place, and time. Mental status is at baseline.     Gait: Gait abnormal.  Psychiatric:        Mood and Affect: Mood normal.        Behavior: Behavior normal.        Thought Content: Thought content normal.     Labs reviewed: Recent Labs    02/08/23 0000 03/08/23 0000 03/15/23  0000  NA 134* 132* 134*  K 4.1 3.5 4.0  CL 99 98* 100  CO2 25* 24* 26*  BUN 15 7 10   CREATININE 0.6 0.4* 0.4*  CALCIUM  9.2 8.7 8.6*   Recent Labs    11/12/22 0000 02/08/23 0000 02/15/23 0000  AST 21 24 25   ALT 17 22 20   ALKPHOS 134* 177* 134*  ALBUMIN 3.2* 3.8 3.5   Recent Labs    11/12/22 0000 02/08/23 0000 03/08/23 0000  WBC 7.4 7.7 8.1  HGB 12.9* 14.7 13.5  HCT 39* 44 40*  PLT 215 294 300   Lab Results  Component Value Date   TSH 2.039 11/02/2021   Lab Results  Component Value Date   HGBA1C 5.6 11/12/2022   Lab Results  Component Value Date   CHOL 79 11/12/2022   HDL 33 (A) 11/12/2022   LDLCALC 30 11/12/2022   LDLDIRECT 72.0 09/27/2017   TRIG 80 11/12/2022   CHOLHDL 2 10/14/2021    Significant Diagnostic Results in last 30 days:  No results found.  Assessment/Plan  SOB (shortness of  breath) C/o SOB, cough, nasal congestion, persisted Treated with Claritin, Flonase  The patient denies chest pain, palpitations, nocturnal dyspnea, fever, chills, nausea, vomiting, constipation, and diarrhea. Their appetite is good. CXR to r/o PNA, CBC/diff, CMP/eGFR, BNP, VS Sat Wt daily O2 2lpm via [place] to maintain Sat O2>90%  Edema of both legs Weight gain about #20Ibs?, noted swelling in legs Re weigh the patient and monitor daily weight, obtain CXR ap/lateral, BNP Furosemide  40mg [place] 20meq every day x 5 days.   Dysphagia choked in the past, chopped meat with thin fluid presently. Underwent GI evaluation. CXR normal 03/07/23  Anxiety failed effexor, stated sleeps well at night.   CAD S/P percutaneous coronary angioplasty ST elevation MI LAD 05/2021, underwent Cath, stenting, s/p percutaneous coronary angioplasty, started DAPT, Echo EF 40-45%,  65-70% 11/02/21, started Plavix  12/24/21 per [facility]  History of pulmonary embolism PE 11/02/21, had CTA 05/12/22, 09/08/22 Cardiology dc Eliquis , start ASA 81mg  qd, continue Plavix  75mg  qd. Repeated CT @ 6 mos,  negative PE  Diastolic dysfunction Grade 2 diastolic dysfunction, swelling BLE, SOB, placed on Furosemide  in setting of weight gained.   Paroxysmal atrial fibrillation (HCC) off Eliquis  08/09/22 per cardiology, on  Metoprolol , Plavix , ASA off Eliquis  08/09/22 per cardiology, on  Metoprolol , Plavix , ASA  GERD thing liquid, hx of dilatation,  takes Pantoprazole , Hgb 13.5 03/08/23  Essential hypertension Blood pressure is controlled, continue Metoprolol .   Neuropathy, peripheral Lower body weakness, motorized w/c for mobility, taking Gabapentin .   Congestive heart failure (CHF) (HCC) 05/10/23 BNP 298 in setting of weigh gain, fluid retention legs, and respiratory symptoms, clinically presumed, obtain Echoardiogram 03/09/24 wbc 9.0, Hgb 12.3, plt 249, neutrophils 81.6   Family/ staff Communication: plan of care reviewed with the  patient, the patient's daughter HPOA, and charge nurse.   Labs/tests ordered:  CXR ap/lateral, CBC/diff, CMP/eGFR, BNP  Time spend 30 minutes.

## 2023-05-10 NOTE — Assessment & Plan Note (Signed)
 choked in the past, chopped meat with thin fluid presently. Underwent GI evaluation. CXR normal 03/07/23

## 2023-05-10 NOTE — Assessment & Plan Note (Addendum)
 off Eliquis 08/09/22 per cardiology, on  Metoprolol, Plavix, ASA off Eliquis 08/09/22 per cardiology, on  Metoprolol, Plavix, ASA

## 2023-05-10 NOTE — Assessment & Plan Note (Signed)
 05/10/23 BNP 298 in setting of weigh gain, fluid retention legs, and respiratory symptoms, clinically presumed, obtain Echoardiogram 03/09/24 wbc 9.0, Hgb 12.3, plt 249, neutrophils 81.6

## 2023-05-10 NOTE — Assessment & Plan Note (Signed)
ST elevation MI LAD 05/2021, underwent Cath, stenting, s/p percutaneous coronary angioplasty, started DAPT, Echo EF 40-45%,  65-70% 11/02/21, started Plavix 12/24/21 per [facility] 

## 2023-05-10 NOTE — Assessment & Plan Note (Signed)
 Weight gain about #20Ibs?, noted swelling in legs Re weigh the patient and monitor daily weight, obtain CXR ap/lateral, BNP Furosemide 40mg /Kcl every day x 5 days.

## 2023-05-10 NOTE — Assessment & Plan Note (Signed)
Blood pressure is controlled, continue Metoprolol. 

## 2023-05-10 NOTE — Assessment & Plan Note (Signed)
 thing liquid, hx of dilatation,  takes Pantoprazole, Hgb 13.5 03/08/23

## 2023-05-10 NOTE — Assessment & Plan Note (Signed)
 failed effexor, stated sleeps well at night.

## 2023-05-10 NOTE — Assessment & Plan Note (Addendum)
 C/o SOB, cough, nasal congestion, persisted Treated with Claritin, Flonase  The patient denies chest pain, palpitations, nocturnal dyspnea, fever, chills, nausea, vomiting, constipation, and diarrhea. Their appetite is good. CXR to r/o PNA, CBC/diff, CMP/eGFR, BNP, VS Sat Wt daily O2 2lpm via [place] to maintain Sat O2>90%

## 2023-05-10 NOTE — Assessment & Plan Note (Signed)
 Grade 2 diastolic dysfunction, swelling BLE, SOB, placed on Furosemide in setting of weight gained.

## 2023-05-10 NOTE — Assessment & Plan Note (Signed)
PE 11/02/21, had CTA 05/12/22, 09/08/22 Cardiology dc Eliquis, start ASA 81mg qd, continue Plavix 75mg qd. Repeated CT @ 6 mos,  negative PE 

## 2023-05-11 DIAGNOSIS — R29898 Other symptoms and signs involving the musculoskeletal system: Secondary | ICD-10-CM | POA: Diagnosis not present

## 2023-05-11 DIAGNOSIS — R1312 Dysphagia, oropharyngeal phase: Secondary | ICD-10-CM | POA: Diagnosis not present

## 2023-05-11 NOTE — Progress Notes (Signed)
Cardiology Office Note:    Date:  05/12/2023   ID:  Gabriel [name], DOB April 03, 1925, MRN [id number]  PCP:  Nelwyn Salisbury, MD   [facility] Providers Cardiologist:  Nicki Guadalajara, MD     Referring MD: Haynes Bast, [facility]   Chief Complaint  Patient presents with   Follow-up    Afib    History of Present Illness:    Gabriel [name] is a 89 y.o. male with a hx of HTN, GERD, and CAD with STEMI Feb 2023, chronic systolic and diastolic heart failure, hx of PE and PAF. LHC with thrombotic occlusion of 90% in the mid LADwith 99% subtotal occlusion in the mid-distal LAD and otherwise nonobstructive disease. DES x 2 non-overlapping 3.0 x 12 mm and 2.0 x 15 mm in the LAD. He was treated with DAPT with ASA and brilinta. Echo with LVEF 40-45%.   Hospitalization 10/2021 with COVID 19 and PE started on eliquis and continued on brilinta, ASA discontinued. Echo at that time showed LVEF 65-70% an dgrade 2 DD. Given falls, repeat CTA in 04/2022 showed no PE and he was taken off eliquis after 6 months of therapy. He remained on ASA and plavix given LAD stent.   He was last seen and by Dr. Tresa Endo 03/2023.  He now resides in skilled nursing facility.  He milligram walks but uses a wheelchair.  EKG at that time showed atrial fibrillation.  He was continued on Eliquis and DAPT was discontinued.  He presents back today for follow-up of chest x-ray. I can't see this but apparently per daughter this showed congestion but no PNA. He describes 20 lb weight gain in 1 month - since seeing Dr. Tresa Endo. He does have SOB and they describe what sounds like orthopnea and was placed on O2 - 2 days ago at night.   Apparently he was placed on lasix 40 mg for a short course then discontinued, then restarted yesterday when he needed oxygen last night. I do not have any of these details. He states the lasix helped his shortness of breath.    Past Medical History:  Diagnosis Date   Arthritis    Diverticulosis of colon     External thrombosed hemorrhoids 06/11/2009   Qualifier: Diagnosis of  By: Clent Ridges MD, Tera Mater    GERD (gastroesophageal reflux disease)    Hypertension     Past Surgical History:  Procedure Laterality Date   cataract extaction, right  6/09   per Dr. Kennedy Bucker   CATARACT EXTRACTION Left    COLONOSCOPY  09/23/04   per Dr. Sherin Quarry, clear, no repeats needed    CORONARY STENT INTERVENTION N/A 06/06/2021   Procedure: CORONARY STENT INTERVENTION;  Surgeon: Yvonne Kendall, MD;  Location: [facility];  Service: Cardiovascular;  Laterality: N/A;   CORONARY/GRAFT ACUTE MI REVASCULARIZATION N/A 06/06/2021   Procedure: Coronary/Graft Acute MI Revascularization;  Surgeon: Yvonne Kendall, MD;  Location: [facility];  Service: Cardiovascular;  Laterality: N/A;   EGD and esophageal diatation,  05-10-11   per Dr. Christella Hartigan     HERNIA REPAIR     LEFT HEART CATH AND CORONARY ANGIOGRAPHY N/A 06/06/2021   Procedure: LEFT HEART CATH AND CORONARY ANGIOGRAPHY;  Surgeon: Yvonne Kendall, MD;  Location: [facility];  Service: Cardiovascular;  Laterality: N/A;   rt inguinal hernia     x2    Current Medications: Current Meds  Medication Sig   acetaminophen (TYLENOL) 325 MG tablet Take 650 mg by  mouth every 6 (six) hours as needed.   apixaban (ELIQUIS) 5 MG TABS tablet Take 1 tablet (5 mg total) by mouth 2 (two) times daily.   atorvastatin (LIPITOR) 40 MG tablet Take 1 tablet (40 mg total) by mouth daily.   clotrimazole-betamethasone (LOTRISONE) cream Apply 1 Application topically 2 (two) times daily as needed. Fax#[phone]   docusate sodium (COLACE) 100 MG capsule Take 100 mg by mouth 2 (two) times daily.   fexofenadine (ALLEGRA) 180 MG tablet Take 180 mg by mouth daily.   fluocinonide cream (LIDEX) 0.05 % Apply 1 Application topically 2 (two) times daily.   fluticasone (FLONASE) 50 MCG/ACT nasal spray Place 2 sprays into both nostrils daily.   gabapentin (NEURONTIN) 300 MG capsule TAKE 1  CAPSULE BY MOUTH THREE TIMES A DAY   Menthol, Topical Analgesic, (BIOFREEZE ROLL-ON) 4 % GEL Apply topically. Apply to neck topically two times a day   metoprolol succinate (TOPROL XL) 25 MG 24 hr tablet Take 0.5 tablets (12.5 mg total) by mouth in the morning and at bedtime.   mometasone (ELOCON) 0.1 % cream Apply 1 Application topically daily.   nitroGLYCERIN (NITROSTAT) 0.4 MG SL tablet Place 1 tablet (0.4 mg total) under the tongue every 5 (five) minutes as needed for chest pain.   nystatin (MYCOSTATIN/NYSTOP) powder Apply 1 Application topically 3 (three) times daily.   pantoprazole (PROTONIX) 40 MG tablet Take 1 tablet (40 mg total) by mouth daily.   polyvinyl alcohol (LIQUIFILM TEARS) 1.4 % ophthalmic solution Place 2 drops into the right eye 2 (two) times daily.   potassium chloride SA (KLOR-CON M) 20 MEQ tablet Take 1 tablet (20 mEq total) by mouth daily.   pramoxine (CERAVE ITCH RELIEF) 1 % LOTN Apply 1 Application topically 2 (two) times daily.   [DISCONTINUED] furosemide (LASIX) 20 MG tablet Take 3 tablets daily for 3 days   [DISCONTINUED] furosemide (LASIX) 40 MG tablet 1 tablet daily   [DISCONTINUED] potassium chloride (KLOR-CON 10) 10 MEQ tablet Take 1 tablet (10 mEq total) by mouth daily.     Allergies:   Effexor [venlafaxine] and Contrast media [iodinated contrast media]   Social History   Socioeconomic History   Marital status: Widowed    Spouse name: Not on file   Number of children: 3   Years of education: Not on file   Highest education level: Not on file  Occupational History   Occupation: retired  Tobacco Use   Smoking status: Former    Current packs/day: 15.00    Average packs/day: 15.0 packs/day for 0.5 years (7.5 ttl pk-yrs)    Types: Cigarettes   Smokeless tobacco: Never   Tobacco comments:    smoked 18 and stopped and states he stopped 30's   Vaping Use   Vaping status: Never Used  Substance and Sexual Activity   Alcohol use: No    Alcohol/week: 0.0  standard drinks of alcohol   Drug use: No   Sexual activity: Never    Birth control/protection: Abstinence  Other Topics Concern   Not on file  Social History Narrative   Lives in studio at [facility]   Widowed since 2009, was married x 57 years.    3 daughters.    Social Drivers of Health   Financial Resource Strain: Low Risk  (06/23/2022)   Overall Financial Resource Strain (CARDIA)    Difficulty of Paying Living Expenses: Not hard at all  Food Insecurity: No Food Insecurity (06/23/2022)   Hunger Vital Sign  Worried About Programme researcher, broadcasting/film/video in the Last Year: Never true    Ran Out of Food in the Last Year: Never true  Transportation Needs: No Transportation Needs (06/23/2022)   PRAPARE - Administrator, Civil Service (Medical): No    Lack of Transportation (Non-Medical): No  Physical Activity: Sufficiently Active (06/23/2022)   Exercise Vital Sign    Days of Exercise per Week: 4 days    Minutes of Exercise per Session: 60 min  Stress: No Stress Concern Present (06/23/2022)   [employer] of Occupational Health - Occupational Stress Questionnaire    Feeling of Stress : Not at all  Social Connections: Moderately Integrated (06/23/2022)   Social Connection and Isolation Panel [NHANES]    Frequency of Communication with Friends and Family: More than three times a week    Frequency of Social Gatherings with Friends and Family: More than three times a week    Attends Religious Services: More than 4 times per year    Active Member of [employer] or Organizations: Yes    Attends Banker Meetings: More than 4 times per year    Marital Status: Widowed     Family History: The patient's family history includes Coronary artery disease in his father. There is no history of Colon cancer, Stomach cancer, Esophageal cancer, or Pancreatic cancer.  ROS:   Please see the history of present illness.     All other systems reviewed and are negative.  EKGs/Labs/Other  Studies Reviewed:    The following studies were reviewed today:    EKG Interpretation Date/Time:  Thursday May 12 2023 11:05:37 EST Ventricular Rate:  69 PR Interval:    QRS Duration:  88 QT Interval:  416 QTC Calculation: 445 R Axis:   88  Text Interpretation: Atrial fibrillation with premature ventricular or aberrantly conducted complexes When compared with ECG of 12-Apr-2023 10:33, Questionable change in QRS axis T wave inversion now evident in Inferior leads Confirmed by Micah Flesher ([id number]) on 05/12/2023 11:11:13 AM    Recent Labs: 02/15/2023: ALT 20 03/08/2023: Hemoglobin 13.5; Platelets 300 03/15/2023: BUN 10; Creatinine 0.4; Potassium 4.0; Sodium 134  Recent Lipid Panel    Component Value Date/Time   CHOL 79 11/12/2022 0000   TRIG 80 11/12/2022 0000   HDL 33 (A) 11/12/2022 0000   CHOLHDL 2 10/14/2021 1141   VLDL 14.2 10/14/2021 1141   LDLCALC 30 11/12/2022 0000   LDLDIRECT 72.0 09/27/2017 0939     Risk Assessment/Calculations:    CHA2DS2-VASc Score = 7   This indicates a 11.2% annual risk of stroke. The patient's score is based upon: CHF History: 1 HTN History: 1 Diabetes History: 0 Stroke History: 2 Vascular Disease History: 1 Age Score: 2 Gender Score: 0         Physical Exam:    VS:  BP (!) 146/80   Pulse 69   Ht 5\' 6"  (1.676 m)   Wt 175 lb (79.4 kg)   SpO2 100%   BMI 28.25 kg/m     Wt Readings from Last 3 Encounters:  05/12/23 175 lb (79.4 kg)  05/10/23 176 lb 4.8 oz (80 kg)  04/28/23 155 lb 6.4 oz (70.5 kg)     GEN: elderly male in NAD HEENT: Normal NECK: + JVD; No carotid bruits LYMPHATICS: No lymphadenopathy CARDIAC: irregular rhythm, regular rate RESPIRATORY:  occasional crackles in bases  ABDOMEN: Soft, non-tender, non-distended MUSCULOSKELETAL:  B LE edema with compression stockings in place, right elbow deformed following  fracture SKIN: Warm and dry NEUROLOGIC:  Alert and oriented x 3 PSYCHIATRIC:  Normal affect    ASSESSMENT:    1. Paroxysmal atrial fibrillation (HCC)   2. Right arm pain   3. Acute on chronic combined systolic and diastolic congestive heart failure (HCC)   4. PAF (paroxysmal atrial fibrillation) (HCC)   5. Bilateral lower extremity edema   6. Closed fracture dislocation of right elbow, sequela   7. Blood clotting disorder (HCC)   8. Cold extremities   9. CAD S/P percutaneous coronary angioplasty   10. History of pulmonary embolism    PLAN:    In order of problems listed above:  Acute on chronic systolic and diastolic heart failure, now with improved EF - reassuring echo 10/2021 - here with 20 lbs weight gain, LE swelling, and SOB - apparently been ?off and on 40 mg lasix - will continue 40 mg lasix indefinitely at this point along with potassium supplement - concerned about CHF exacerbation due to AFib   PAF - newly recognized 03/2023 - rate controlled - EKG today with rate controlled Afib - would opt to keep him in rate controlled Afib given clinical picture, however concern regarding CHF exacerbation - will start 40 mg lasix and monitor response -I do not think he would be a great candidate for cardioversion with need for sedation - Could possibly consider amiodarone therapy - I think we should achieve euvolemia before starting an antiarrhythmic, but will defer to Dr. Tresa Endo   Fingers blue and cold on right hand - intact radial pulse - will order arterial doppler - I"m concerned about vascular compromise in this hand which is on the side of his elbow fracture - apparently he can "rub" his fingers and relieve the blueness, also worsens when he hangs his arm down - no pain - could be Raynaud's, but want to rule an arterial problem -We were fortunately able to obtain right upper extremity arterial Doppler and she was also able to rule out DVT, no arterial compromise   CAD s/p STEMI - DES x 2 LAD - NO DAPT give eliquis -- conitnue beta-blocker,  statin   Hyperlipidemia with LDL goal less than 70 - Continue statin   Hx of PE in the setting of COVID-19 PNA 10/2021 - completed 6 months of Eliquis therapy - Follow-up CTA negative for PE - initial plan was to discontinue eliquis but this was continued last month in light of newly recognized AFib   Symptoms are difficult, he is very HOH and the visit was primarily conducted with my communications written on a white board. Symptoms are difficult, I think his is very stoic. Unclear what lasix regimen has been trialed as paperwork is incomplete from SNF.  I think that he needs to stay on 40 mg Lasix daily and I think it would be a good idea to increase to 60 mg Lasix x 3 days.  He is on 20 mEq of potassium supplementation and I agree with this based on the labs that were drawn yesterday at SNF.  I suspect his A-fib is likely contributing to this heart failure exacerbation.  Given his age and frailty, I would like to see if we can control symptoms of heart failure with Lasix.  I do not think he is a great candidate for cardioversion given need for sedation.   Follow-up with Dr. Tresa Endo in 1 month.   Medication Adjustments/Labs and Tests Ordered: Current medicines are reviewed at length with the patient today.  Concerns regarding medicines are outlined above.  Orders Placed This Encounter  Procedures   EKG 12-Lead   VAS [place] UPPER EXTREMITY ARTERIAL DUPLEX   Meds ordered this encounter  Medications   potassium chloride SA (KLOR-CON M) 20 MEQ tablet    Sig: Take 1 tablet (20 mEq total) by mouth daily.    Dispense:  90 tablet    Refill:  3   DISCONTD: furosemide (LASIX) 40 MG tablet    Sig: 1 tablet daily    Dispense:  90 tablet    Refill:  3    Start taking 1 tablet daily after you complete Lasix 60 mg for 3 days   DISCONTD: furosemide (LASIX) 20 MG tablet    Sig: Take 3 tablets daily for 3 days    Dispense:  9 tablet    Refill:  0   DISCONTD: furosemide (LASIX) 40 MG tablet     Sig: Take 1.5 tablets (60 mg total) by mouth daily.    Dispense:  135 tablet    Refill:  3    Start taking 1 tablet daily after you complete Lasix 60 mg for 3 days   furosemide (LASIX) 40 MG tablet    Sig: Take 1.5 pills daily for 3 days then resume 40 mg daily.    Dispense:  90 tablet    Refill:  3    Start taking 1 tablet daily after you complete Lasix 60 mg for 3 days    Patient Instructions  Medication Instructions:  Increase Furosemide (Lasix) 60 mg for 3 days, then resume Furosemide 40 mg daily Potassium 20 mEq daily  *If you need a refill on your cardiac medications before your next appointment, please call your pharmacy*   Lab Work: NONE ordered at this time of appointment    Testing/Procedures: Your physician has requested that you have a upper extremity arterial duplex. This test is an ultrasound of the arteries in the legs or arms. It looks at arterial blood flow in the legs and arms. Allow one hour for Lower and Upper Arterial scans. There are no restrictions or special instructions.  Please note: We ask at that you not bring children with you during ultrasound (echo/ vascular) testing. Due to room size and safety concerns, children are not allowed in the ultrasound rooms during exams. Our front office staff cannot provide observation of children in our lobby area while testing is being conducted. An adult accompanying a patient to their appointment will only be allowed in the ultrasound room at the discretion of the ultrasound technician under special circumstances. We apologize for any inconvenience.    Follow-Up: At [facility], you and your health needs are our priority.  As part of our continuing mission to provide you with exceptional heart care, we have created designated Provider Care Teams.  These Care Teams include your primary Cardiologist (physician) and Advanced Practice Providers (APPs -  Physician Assistants and Nurse Practitioners) who all work  together to provide you with the care you need, when you need it.  We recommend signing up for the patient portal called "MyChart".  Sign up information is provided on this After Visit Summary.  MyChart is used to connect with patients for Virtual Visits (Telemedicine).  Patients are able to view lab/test results, encounter notes, upcoming appointments, etc.  Non-urgent messages can be sent to your provider as well.   To learn more about what you can do with MyChart, go to [email or web address].    Your next  appointment:   1 month(s)  Provider:   Micah Flesher, PA-C        Other Instructions          Signed, Marcelino Duster, PA  05/12/2023 12:36 PM    [facility]

## 2023-05-12 ENCOUNTER — Other Ambulatory Visit: Payer: Self-pay

## 2023-05-12 ENCOUNTER — Other Ambulatory Visit (HOSPITAL_COMMUNITY): Payer: Self-pay | Admitting: Physician Assistant

## 2023-05-12 ENCOUNTER — Ambulatory Visit
Admission: RE | Admit: 2023-05-12 | Discharge: 2023-05-12 | Disposition: A | Discharge status: Home / Self Care | Payer: Medicare Other | Source: Ambulatory Visit | Attending: Internal Medicine | Admitting: Internal Medicine

## 2023-05-12 ENCOUNTER — Ambulatory Visit (INDEPENDENT_AMBULATORY_CARE_PROVIDER_SITE_OTHER): Payer: Medicare Other | Admitting: Physician Assistant

## 2023-05-12 ENCOUNTER — Encounter: Payer: Self-pay | Admitting: Physician Assistant

## 2023-05-12 VITALS — BP 146/80 | HR 69 | Ht 66.0 in | Wt 175.0 lb

## 2023-05-12 DIAGNOSIS — M79601 Pain in right arm: Secondary | ICD-10-CM

## 2023-05-12 DIAGNOSIS — R209 Unspecified disturbances of skin sensation: Secondary | ICD-10-CM | POA: Diagnosis not present

## 2023-05-12 DIAGNOSIS — D689 Coagulation defect, unspecified: Secondary | ICD-10-CM | POA: Diagnosis not present

## 2023-05-12 DIAGNOSIS — S42401S Unspecified fracture of lower end of right humerus, sequela: Secondary | ICD-10-CM | POA: Insufficient documentation

## 2023-05-12 DIAGNOSIS — I5043 Acute on chronic combined systolic (congestive) and diastolic (congestive) heart failure: Secondary | ICD-10-CM | POA: Diagnosis not present

## 2023-05-12 DIAGNOSIS — Z86711 Personal history of pulmonary embolism: Secondary | ICD-10-CM | POA: Insufficient documentation

## 2023-05-12 DIAGNOSIS — I480 Paroxysmal atrial fibrillation: Secondary | ICD-10-CM

## 2023-05-12 DIAGNOSIS — Z9861 Coronary angioplasty status: Secondary | ICD-10-CM | POA: Diagnosis not present

## 2023-05-12 DIAGNOSIS — I2510 Atherosclerotic heart disease of native coronary artery without angina pectoris: Secondary | ICD-10-CM | POA: Diagnosis not present

## 2023-05-12 DIAGNOSIS — R600 Localized edema: Secondary | ICD-10-CM | POA: Insufficient documentation

## 2023-05-12 MED ORDER — FUROSEMIDE 40 MG PO TABS: 60.0000 mg | ORAL_TABLET | Freq: Every day | ORAL | 3 refills | Status: DC

## 2023-05-12 MED ORDER — FUROSEMIDE 40 MG PO TABS: ORAL_TABLET | ORAL | 3 refills | Status: DC

## 2023-05-12 MED ORDER — POTASSIUM CHLORIDE CRYS ER 20 MEQ PO TBCR: 20.0000 meq | EXTENDED_RELEASE_TABLET | Freq: Every day | ORAL | 3 refills | Status: DC

## 2023-05-12 MED ORDER — FUROSEMIDE 20 MG PO TABS
ORAL_TABLET | ORAL | 0 refills | Status: DC
Start: 2023-05-12 — End: 2023-05-12

## 2023-05-12 NOTE — Patient Instructions (Signed)
Medication Instructions:  Increase Furosemide (Lasix) 60 mg for 3 days, then resume Furosemide 40 mg daily Potassium 20 mEq daily  *If you need a refill on your cardiac medications before your next appointment, please call your pharmacy*   Lab Work: NONE ordered at this time of appointment    Testing/Procedures: Your physician has requested that you have a upper extremity arterial duplex. This test is an ultrasound of the arteries in the legs or arms. It looks at arterial blood flow in the legs and arms. Allow one hour for Lower and Upper Arterial scans. There are no restrictions or special instructions.  Please note: We ask at that you not bring children with you during ultrasound (echo/ vascular) testing. Due to room size and safety concerns, children are not allowed in the ultrasound rooms during exams. Our front office staff cannot provide observation of children in our lobby area while testing is being conducted. An adult accompanying a patient to their appointment will only be allowed in the ultrasound room at the discretion of the ultrasound technician under special circumstances. We apologize for any inconvenience.    Follow-Up: At [facility], you and your health needs are our priority.  As part of our continuing mission to provide you with exceptional heart care, we have created designated Provider Care Teams.  These Care Teams include your primary Cardiologist (physician) and Advanced Practice Providers (APPs -  Physician Assistants and Nurse Practitioners) who all work together to provide you with the care you need, when you need it.  We recommend signing up for the patient portal called "MyChart".  Sign up information is provided on this After Visit Summary.  MyChart is used to connect with patients for Virtual Visits (Telemedicine).  Patients are able to view lab/test results, encounter notes, upcoming appointments, etc.  Non-urgent messages can be sent to your provider as  well.   To learn more about what you can do with MyChart, go to [email or web address].    Your next appointment:   1 month(s)  Provider:   Micah Flesher, PA-C        Other Instructions

## 2023-05-13 DIAGNOSIS — R29898 Other symptoms and signs involving the musculoskeletal system: Secondary | ICD-10-CM | POA: Diagnosis not present

## 2023-05-13 DIAGNOSIS — R1312 Dysphagia, oropharyngeal phase: Secondary | ICD-10-CM | POA: Diagnosis not present

## 2023-05-16 DIAGNOSIS — R1312 Dysphagia, oropharyngeal phase: Secondary | ICD-10-CM | POA: Diagnosis not present

## 2023-05-16 DIAGNOSIS — R29898 Other symptoms and signs involving the musculoskeletal system: Secondary | ICD-10-CM | POA: Diagnosis not present

## 2023-05-17 ENCOUNTER — Telehealth: Payer: Self-pay

## 2023-05-17 DIAGNOSIS — R29898 Other symptoms and signs involving the musculoskeletal system: Secondary | ICD-10-CM | POA: Diagnosis not present

## 2023-05-17 DIAGNOSIS — R1312 Dysphagia, oropharyngeal phase: Secondary | ICD-10-CM | POA: Diagnosis not present

## 2023-05-17 NOTE — Telephone Encounter (Signed)
Nursing home called to know strength of lidex as well as if pt could use greer goo instead of plain nystatin powder.per dr. Caralyn Guile, I l/m for tywan (nurse) that he should use nystatin powder and not compound of greer goo

## 2023-05-18 DIAGNOSIS — R29898 Other symptoms and signs involving the musculoskeletal system: Secondary | ICD-10-CM | POA: Diagnosis not present

## 2023-05-18 DIAGNOSIS — R1312 Dysphagia, oropharyngeal phase: Secondary | ICD-10-CM | POA: Diagnosis not present

## 2023-05-19 ENCOUNTER — Non-Acute Institutional Stay: Payer: Medicare Other | Admitting: Nurse Practitioner

## 2023-05-19 ENCOUNTER — Encounter: Payer: Self-pay | Admitting: Nurse Practitioner

## 2023-05-19 DIAGNOSIS — Z0000 Encounter for general adult medical examination without abnormal findings: Secondary | ICD-10-CM

## 2023-05-19 DIAGNOSIS — R1312 Dysphagia, oropharyngeal phase: Secondary | ICD-10-CM | POA: Diagnosis not present

## 2023-05-19 DIAGNOSIS — R29898 Other symptoms and signs involving the musculoskeletal system: Secondary | ICD-10-CM | POA: Diagnosis not present

## 2023-05-19 NOTE — Progress Notes (Signed)
Subjective:   Gabriel [name] is a 89 y.o. male who presents for Medicare Annual/Subsequent preventive examination.  Visit Complete: In person  Patient Medicare AWV questionnaire was completed by the patient on 05/19/23; I have confirmed that all information answered by patient is correct and no changes since this date.  Cardiac Risk Factors include: advanced age (>20men, >47 women);dyslipidemia;hypertension;male gender;sedentary lifestyle     Objective:    Today's Vitals   05/19/23 1015 05/19/23 1347  BP: 134/66   Pulse: 75   Temp: (!) 96.2 F (35.7 C)   SpO2: 96%   Weight: 165 lb 9.6 oz (75.1 kg)   Height: 5\' 6"  (1.676 m)   PainSc: 0-No pain 3    Body mass index is 26.73 kg/m.     05/19/2023   10:23 AM 04/22/2023   10:18 AM 04/08/2023   11:30 AM 02/24/2023    9:29 AM 02/16/2023    1:26 PM 02/09/2023    1:36 PM 02/04/2023    9:25 AM  Advanced Directives  Does Patient Have a Medical Advance Directive? Yes Yes Yes Yes Yes Yes Yes  Type of Estate agent of [place];Living will Healthcare Power of [place];Living will Healthcare Power of [place];Living will Healthcare Power of [place];Living will Healthcare Power of [place];Living will Healthcare Power of [place];Living will Healthcare Power of [place];Living will  Does patient want to make changes to medical advance directive? No - Patient declined No - Patient declined No - Patient declined No - Patient declined No - Patient declined No - Patient declined No - Patient declined  Copy of Healthcare Power of Attorney in Chart? Yes - validated most recent copy scanned in chart (See row information) Yes - validated most recent copy scanned in chart (See row information) Yes - validated most recent copy scanned in chart (See row information) Yes - validated most recent copy scanned in chart (See row information) Yes - validated most recent copy scanned in chart (See row information) Yes - validated most  recent copy scanned in chart (See row information) Yes - validated most recent copy scanned in chart (See row information)    Current Medications (verified) Outpatient Encounter Medications as of 05/19/2023  Medication Sig   acetaminophen (TYLENOL) 325 MG tablet Take 650 mg by mouth every 6 (six) hours as needed.   acetaminophen (TYLENOL) 500 MG tablet Take 500 mg by mouth 2 (two) times daily.   apixaban (ELIQUIS) 5 MG TABS tablet Take 1 tablet (5 mg total) by mouth 2 (two) times daily.   atorvastatin (LIPITOR) 40 MG tablet Take 1 tablet (40 mg total) by mouth daily.   clotrimazole-betamethasone (LOTRISONE) cream Apply 1 Application topically 2 (two) times daily as needed. Fax#[phone]   docusate sodium (COLACE) 100 MG capsule Take 100 mg by mouth 2 (two) times daily.   feeding supplement (BOOST HIGH PROTEIN) LIQD Take 1 Container by mouth 2 (two) times daily between meals.   fexofenadine (ALLEGRA) 180 MG tablet Take 180 mg by mouth daily.   fluocinonide cream (LIDEX) 0.05 % Apply 1 Application topically 2 (two) times daily.   fluticasone (FLONASE) 50 MCG/ACT nasal spray Place 2 sprays into both nostrils daily.   furosemide (LASIX) 40 MG tablet Take 1.5 pills daily for 3 days then resume 40 mg daily.   gabapentin (NEURONTIN) 300 MG capsule TAKE 1 CAPSULE BY MOUTH THREE TIMES A DAY   Menthol, Topical Analgesic, (BIOFREEZE ROLL-ON) 4 % GEL Apply topically. Apply to neck topically two times a day  metoprolol succinate (TOPROL XL) 25 MG 24 hr tablet Take 0.5 tablets (12.5 mg total) by mouth in the morning and at bedtime.   mometasone (ELOCON) 0.1 % cream Apply 1 Application topically daily.   nitroGLYCERIN (NITROSTAT) 0.4 MG SL tablet Place 1 tablet (0.4 mg total) under the tongue every 5 (five) minutes as needed for chest pain.   nystatin (MYCOSTATIN/NYSTOP) powder Apply 1 Application topically 3 (three) times daily.   pantoprazole (PROTONIX) 40 MG tablet Take 1 tablet (40 mg total) by mouth  daily.   polyvinyl alcohol (LIQUIFILM TEARS) 1.4 % ophthalmic solution Place 2 drops into the right eye 2 (two) times daily.   potassium chloride SA (KLOR-CON M) 20 MEQ tablet Take 1 tablet (20 mEq total) by mouth daily.   pramoxine (CERAVE ITCH RELIEF) 1 % LOTN Apply 1 Application topically 2 (two) times daily.   zinc oxide 20 % ointment Apply 1 Application topically daily at 12 noon. Apply to buttocks after applying Fluocinonide Cream   No facility-administered encounter medications on file as of 05/19/2023.    Allergies (verified) Effexor [venlafaxine] and Contrast media [iodinated contrast media]   History: Past Medical History:  Diagnosis Date   Arthritis    Diverticulosis of colon    External thrombosed hemorrhoids 06/11/2009   Qualifier: Diagnosis of  By: Clent Ridges MD, Tera Mater    GERD (gastroesophageal reflux disease)    Hypertension    Past Surgical History:  Procedure Laterality Date   cataract extaction, right  6/09   per Dr. Kennedy Bucker   CATARACT EXTRACTION Left    COLONOSCOPY  09/23/04   per Dr. Sherin Quarry, clear, no repeats needed    CORONARY STENT INTERVENTION N/A 06/06/2021   Procedure: CORONARY STENT INTERVENTION;  Surgeon: Yvonne Kendall, MD;  Location: [facility];  Service: Cardiovascular;  Laterality: N/A;   CORONARY/GRAFT ACUTE MI REVASCULARIZATION N/A 06/06/2021   Procedure: Coronary/Graft Acute MI Revascularization;  Surgeon: Yvonne Kendall, MD;  Location: [facility];  Service: Cardiovascular;  Laterality: N/A;   EGD and esophageal diatation,  05-10-11   per Dr. Christella Hartigan     HERNIA REPAIR     LEFT HEART CATH AND CORONARY ANGIOGRAPHY N/A 06/06/2021   Procedure: LEFT HEART CATH AND CORONARY ANGIOGRAPHY;  Surgeon: Yvonne Kendall, MD;  Location: [facility];  Service: Cardiovascular;  Laterality: N/A;   rt inguinal hernia     x2   Family History  Problem Relation Age of Onset   Coronary artery disease Father    Colon cancer Neg Hx    Stomach  cancer Neg Hx    Esophageal cancer Neg Hx    Pancreatic cancer Neg Hx    Social History   Socioeconomic History   Marital status: Widowed    Spouse name: Not on file   Number of children: 3   Years of education: Not on file   Highest education level: Not on file  Occupational History   Occupation: retired  Tobacco Use   Smoking status: Former    Current packs/day: 15.00    Average packs/day: 15.0 packs/day for 0.5 years (7.5 ttl pk-yrs)    Types: Cigarettes   Smokeless tobacco: Never   Tobacco comments:    smoked 18 and stopped and states he stopped 30's   Vaping Use   Vaping status: Never Used  Substance and Sexual Activity   Alcohol use: No    Alcohol/week: 0.0 standard drinks of alcohol   Drug use: No   Sexual activity:  Never    Birth control/protection: Abstinence  Other Topics Concern   Not on file  Social History Narrative   L[facility] in studio [facility]   Widowed since 2009, was married x 57 years.    3 daughters.    Social Drivers of Corporate investment banker Strain: Low Risk  (06/23/2022)   Overall Financial Resource Strain (CARDIA)    Difficulty of Paying Living Expenses: Not hard at all  Food Insecurity: No Food Insecurity (06/23/2022)   Hunger Vital Sign    Worried About Running Out of Food in the Last Year: Never true    Ran Out of Food in the Last Year: Never true  Transportation Needs: No Transportation Needs (06/23/2022)   PRAPARE - Administrator, Civil Service (Medical): No    Lack of Transportation (Non-Medical): No  Physical Activity: Sufficiently Active (06/23/2022)   Exercise Vital Sign    Days of Exercise per Week: 4 days    Minutes of Exercise per Session: 60 min  Stress: No Stress Concern Present (06/23/2022)   [employer] of Occupational Health - Occupational Stress Questionnaire    Feeling of Stress : Not at all  Social Connections: Moderately Integrated (06/23/2022)   Social Connection and Isolation Panel [NHANES]     Frequency of Communication with Friends and Family: More than three times a week    Frequency of Social Gatherings with Friends and Family: More than three times a week    Attends Religious Services: More than 4 times per year    Active Member of [employer] or Organizations: Yes    Attends Banker Meetings: More than 4 times per year    Marital Status: Widowed    Tobacco Counseling Counseling given: Not Answered Tobacco comments: smoked 18 and stopped and states he stopped 30's    Clinical Intake:  Pre-visit preparation completed: Yes  Pain : 0-10 Pain Score: 3  Pain Type: Chronic pain Pain Location: Neck Pain Orientation: Mid Pain Radiating Towards: none Pain Descriptors / Indicators: Aching Pain Onset: More than a month ago Pain Frequency: Occasional Pain Relieving Factors: rest, Tylenol Effect of Pain on Daily Activities: none  Pain Relieving Factors: rest, Tylenol  BMI - recorded: 26.73 Nutritional Status: BMI 25 -29 Overweight Nutritional Risks: None Diabetes: No  How often do you need to have someone help you when you read instructions, pamphlets, or other written materials from your doctor or pharmacy?: 4 - Often What is the last grade level you completed in school?: college  Interpreter Needed?: No  Information entered by :: Koriana Stepien Nedra Hai NP   Activities of Daily Living    05/19/2023    1:49 PM 06/23/2022    3:07 PM  In your present state of health, do you have any difficulty performing the following activities:  Hearing? 1 1  Comment  Wears hearing aids  Vision? 0 0  Difficulty concentrating or making decisions? 1 0  Walking or climbing stairs? 1 1  Comment  Uses electric wheelchair, walker and cane  Dressing or bathing? 1 0  Doing errands, shopping? 1 0  Preparing Food and eating ? N N  Using the Toilet? Y N  In the past six months, have you accidently leaked urine? Y Y  Comment  Wearf breifs. Followed by PCP  Do you have problems with  loss of bowel control? Y N  Managing your Medications? Y N  Managing your Finances? Y N  Housekeeping or managing your  Housekeeping? Y N    Patient Care Team: Nelwyn Salisbury, MD as PCP - General (Family Medicine) Lennette Bihari, MD as PCP - Cardiology (Cardiology) Ernesto Rutherford, MD as Consulting Physician (Ophthalmology) Luciana Axe Alford Highland, MD as Consulting Physician (Ophthalmology)  Indicate any recent Medical Services you may have received from other than [facility] providers in the past year (date may be approximate).     Assessment:   This is a routine wellness examination for Khyren.  Hearing/Vision screen No results found.   Goals Addressed             This Visit's Progress    Maintain Mobility and Function       Evidence-based guidance:  Acknowledge and validate impact of pain, loss of strength and potential disfigurement (hand osteoarthritis) on mental health and daily life, such as social isolation, anxiety, depression, impaired sexual relationship and   injury from falls.  Anticipate referral to physical or occupational therapy for assessment, therapeutic exercise and recommendation for adaptive equipment or assistive devices; encourage participation.  Assess impact on ability to perform activities of daily living, as well as engage in sports and leisure events or requirements of work or school.  Provide anticipatory guidance and reassurance about the benefit of exercise to maintain function; acknowledge and normalize fear that exercise may worsen symptoms.  Encourage regular exercise, at least 10 minutes at a time for 45 minutes per week; consider yoga, water exercise and proprioceptive exercises; encourage use of wearable activity tracker to increase motivation and adherence.  Encourage maintenance or resumption of daily activities, including employment, as pain allows and with minimal exposure to trauma.  Assist patient to advocate for adaptations to the work environment.   Consider level of pain and function, gender, age, lifestyle, patient preference, quality of life, readiness and ?ocapacity to benefit? when recommending patients for orthopaedic surgery consultation.  Explore strategies, such as changes to medication regimen or activity that enables patient to anticipate and manage flare-ups that increase deconditioning and disability.  Explore patient preferences; encourage exposure to a broader range of activities that have been avoided for fear of experiencing pain.  Identify barriers to participation in therapy or exercise, such as pain with activity, anticipated or imagined pain.  Monitor postoperative joint replacement or any preexisting joint replacement for ongoing pain and loss of function; provide social support and encouragement throughout recovery.   Notes:        Depression Screen    05/19/2023    1:51 PM 06/23/2022    3:05 PM 06/01/2022   11:05 AM 03/31/2022    2:31 PM 10/14/2021   10:56 AM 06/23/2021    1:56 PM 06/22/2021    3:03 PM  PHQ 2/9 Scores  PHQ - 2 Score 0 0 0 0 0 1 0  PHQ- 9 Score     0 1     Fall Risk    10/19/2022   11:19 AM 06/23/2022    3:12 PM 06/15/2022    9:14 AM 06/01/2022   11:04 AM 05/04/2022    2:44 PM  Fall Risk   Falls in the past year? Exclusion - non ambulatory 1 1 1 1   Number falls in past yr:  0 1 1 1   Injury with Fall?  1 1 1 1   Comment  Fx [facility]t [place]. Followed by Orthopedic     Risk for fall due to :  Orthopedic patient History of fall(s);Impaired balance/gait;Impaired mobility History of fall(s);Impaired balance/gait;Impaired mobility;Impaired vision History of fall(s);Impaired balance/gait;Impaired  mobility;Impaired vision  Follow up Falls evaluation completed Falls prevention discussed Falls evaluation completed Falls evaluation completed Falls evaluation completed    MEDICARE RISK AT HOME: Medicare Risk at Home Any stairs in or around the home?: Yes If so, are there any without handrails?: No Home free  of loose throw rugs in walkways, pet beds, electrical cords, etc?: Yes Adequate lighting in your home to reduce risk of falls?: Yes Life alert?: No Use of a cane, walker or w/c?: Yes Grab bars in the bathroom?: Yes Shower chair or bench in shower?: Yes Elevated toilet seat or a handicapped toilet?: Yes  TIMED UP AND GO:  Was the test performed?  No    Cognitive Function:    01/30/2018   11:33 AM 10/20/2016   10:32 AM 10/17/2015   10:58 AM  MMSE - Mini Mental State Exam  Not completed: --    Orientation to time  4 5  Orientation to Place  5 5  Registration  3 3  Attention/ Calculation  5 1  Recall  3 3  Language- name 2 objects  2 2  Language- repeat  1 1  Language- follow 3 step command  3 3  Language- read & follow direction  1 1  Write a sentence  1 1  Copy design  1 1  Total score  29 26        06/23/2022    3:14 PM 06/22/2021    3:15 PM 05/30/2019   12:49 PM  6CIT Screen  What Year? 0 points 0 points 0 points  What month? 0 points 0 points 0 points  What time? 0 points 0 points 0 points  Count back from 20 0 points 0 points 0 points  Months in reverse 0 points 0 points 0 points  Repeat phrase 0 points 0 points 0 points  Total Score 0 points 0 points 0 points    Immunizations Immunization History  Administered Date(s) Administered   Fluad Quad(high Dose 65+) 02/15/2022, 02/08/2023   Influenza Split 01/20/2012, 01/07/2013   Influenza Whole 02/09/2006, 01/11/2008   Influenza, High Dose Seasonal PF 12/30/2016, 12/27/2017, 01/08/2019   Influenza,inj,quad, With Preservative 12/29/2018   Influenza-Unspecified 01/17/2014, 01/14/2016, 12/30/2016   Moderna Covid-19 Fall Seasonal Vaccine 51yrs & older 03/22/2022, 02/09/2023   PFIZER Comirnaty(Gray Top)Covid-19 Tri-Sucrose Vaccine 05/28/2019, 03/04/2020, 09/23/2020   Pfizer Covid-19 Vaccine Bivalent Booster 52yrs & up 02/25/2022   Pneumococcal Conjugate-13 09/23/2015   Pneumococcal Polysaccharide-23 03/13/2008   Td  12/20/2007   Tdap 07/06/2021   Unspecified SARS-COV-2 Vaccination 04/30/2019   Zoster Recombinant(Shingrix) 03/11/2020, 06/20/2020    TDAP status: Up to date  Flu Vaccine status: Up to date  Pneumococcal vaccine status: Up to date  Covid-19 vaccine status: Completed vaccines  Qualifies for Shingles Vaccine? Yes   Zostavax completed Yes   Shingrix Completed?: Yes  Screening Tests Health Maintenance  Topic Date Due   COVID-19 Vaccine (7 - 2024-25 season) 04/06/2023   Medicare Annual Wellness (AWV)  05/18/2024   DTaP/Tdap/Td (3 - Td or Tdap) 07/07/2031   Pneumonia Vaccine 67+ Years old  Completed   INFLUENZA VACCINE  Completed   Zoster Vaccines- Shingrix  Completed   HPV VACCINES  Aged Out   FOOT EXAM  Discontinued   HEMOGLOBIN A1C  Discontinued   OPHTHALMOLOGY EXAM  Discontinued    Health Maintenance  Health Maintenance Due  Topic Date Due   COVID-19 Vaccine (7 - 2024-25 season) 04/06/2023    Colorectal cancer screening: No longer required.  Lung Cancer Screening: (Low Dose CT Chest recommended if Age 62-80 years, 20 pack-year currently smoking OR have quit w/in 15years.) does not qualify.   Lung Cancer Screening Referral: NA  Additional Screening:  Hepatitis C Screening: does not qualify;   Vision Screening: Recommended annual ophthalmology exams for early detection of glaucoma and other disorders of the eye. Is the patient up to date with their annual eye exam?  No  Who is the provider or what is the name of the office in which the patient attends annual eye exams? [facility] will provide if desires.  If pt is not established with a provider, would they like to be referred to a provider to establish care? No .   Dental Screening: Recommended annual dental exams for proper oral hygiene  Diabetic Foot Exam: NA  Community Resource Referral / Chronic Care Management: CRR required this visit?  No   CCM required this visit?  No     Plan:     I have personally  reviewed and noted the following in the patient's chart:   Medical and social history Use of alcohol, tobacco or illicit drugs  Current medications and supplements including opioid prescriptions. Patient is not currently taking opioid prescriptions. Functional ability and status Nutritional status Physical activity Advanced directives List of other physicians Hospitalizations, surgeries, and ER visits in previous 12 months Vitals Screenings to include cognitive, depression, and falls Referrals and appointments  In addition, I have reviewed and discussed with patient certain preventive protocols, quality metrics, and best practice recommendations. A written personalized care plan for preventive services as well as general preventive health recommendations were provided to patient.     Rena Hunke X Girtha Kilgore, NP   05/20/2023   After Visit Summary: (In Person-Declined) Patient declined AVS at this time.

## 2023-05-20 ENCOUNTER — Encounter: Payer: Self-pay | Admitting: Nurse Practitioner

## 2023-05-20 DIAGNOSIS — R29898 Other symptoms and signs involving the musculoskeletal system: Secondary | ICD-10-CM | POA: Diagnosis not present

## 2023-05-20 DIAGNOSIS — R1312 Dysphagia, oropharyngeal phase: Secondary | ICD-10-CM | POA: Diagnosis not present

## 2023-05-23 ENCOUNTER — Encounter: Payer: Self-pay | Admitting: Nurse Practitioner

## 2023-05-23 DIAGNOSIS — R29898 Other symptoms and signs involving the musculoskeletal system: Secondary | ICD-10-CM | POA: Diagnosis not present

## 2023-05-23 DIAGNOSIS — R1312 Dysphagia, oropharyngeal phase: Secondary | ICD-10-CM | POA: Diagnosis not present

## 2023-05-23 NOTE — Progress Notes (Signed)
This encounter was created in error - please disregard.

## 2023-05-24 ENCOUNTER — Telehealth: Payer: Self-pay

## 2023-05-24 DIAGNOSIS — R1312 Dysphagia, oropharyngeal phase: Secondary | ICD-10-CM | POA: Diagnosis not present

## 2023-05-24 DIAGNOSIS — R29898 Other symptoms and signs involving the musculoskeletal system: Secondary | ICD-10-CM | POA: Diagnosis not present

## 2023-05-24 NOTE — Telephone Encounter (Signed)
Copied from CRM [id number]. Topic: Clinical - Medical Advice >> May 24, 2023 11:47 AM Lorin Glass B wrote: Reason for CRM: Patients daughter called stating father has a lot of hearing loss, and doctor at nursing home found patient has a lot of ear wax build up. Daughter wants to know if this is a procedure that Dr Clent Ridges can perform as they prefer to see no one else but him. Cathy[facility] Callback [phone]

## 2023-05-25 DIAGNOSIS — R1312 Dysphagia, oropharyngeal phase: Secondary | ICD-10-CM | POA: Diagnosis not present

## 2023-05-25 DIAGNOSIS — R29898 Other symptoms and signs involving the musculoskeletal system: Secondary | ICD-10-CM | POA: Diagnosis not present

## 2023-05-25 NOTE — Telephone Encounter (Signed)
Yes set up an OV

## 2023-05-25 NOTE — Telephone Encounter (Signed)
Ok to call pt and schedule appointment for ear cleaning. Please advise

## 2023-05-26 ENCOUNTER — Non-Acute Institutional Stay (SKILLED_NURSING_FACILITY): Payer: Self-pay | Admitting: Sports Medicine

## 2023-05-26 DIAGNOSIS — R1312 Dysphagia, oropharyngeal phase: Secondary | ICD-10-CM | POA: Diagnosis not present

## 2023-05-26 DIAGNOSIS — K219 Gastro-esophageal reflux disease without esophagitis: Secondary | ICD-10-CM

## 2023-05-26 DIAGNOSIS — R29898 Other symptoms and signs involving the musculoskeletal system: Secondary | ICD-10-CM | POA: Diagnosis not present

## 2023-05-26 DIAGNOSIS — J302 Other seasonal allergic rhinitis: Secondary | ICD-10-CM | POA: Diagnosis not present

## 2023-05-26 DIAGNOSIS — H9190 Unspecified hearing loss, unspecified ear: Secondary | ICD-10-CM | POA: Diagnosis not present

## 2023-05-26 DIAGNOSIS — I480 Paroxysmal atrial fibrillation: Secondary | ICD-10-CM | POA: Diagnosis not present

## 2023-05-26 NOTE — Progress Notes (Signed)
Provider:  Venita Sheffield MD Location:   [facility]    Place of Service:  Skilled care    PCP: Nelwyn Salisbury, MD Patient Care Team: Nelwyn Salisbury, MD as PCP - General (Family Medicine) Lennette Bihari, MD as PCP - Cardiology (Cardiology) Ernesto Rutherford, MD as Consulting Physician (Ophthalmology) Luciana Axe Alford Highland, MD as Consulting Physician (Ophthalmology)  Extended Emergency Contact Information Primary Emergency Contact: Elspeth Cho [place] Home Phone: [phone] Mobile Phone: [phone] Relation: Daughter Secondary Emergency Contact: Doree Albee [place] Home Phone: [phone] Relation: Daughter  Code Status:  Goals of Care: Advanced Directive information    05/19/2023   10:23 AM  Advanced Directives  Does Patient Have a Medical Advance Directive? Yes  Type of Estate agent of [employer];Living will  Does patient want to make changes to medical advance directive? No - Patient declined  Copy of Healthcare Power of Attorney in Chart? Yes - validated most recent copy scanned in chart (See row information)      No chief complaint on file.   HPI: Patient is a 89 y.o. male seen today for acute visit Pt walked to our office regarding medication concerns Pt reports that he does not want to take flonase nasal spray  Flonase was started for post nasal drip and nasal congestion  Currently denies nasal congestion, sinus pain, post nasal drip  He wants flonase to be stopped  GERD Pt has intermittent heart burn He does not want to take protonix daily  Denies heart burn or acid reflux  Hearing impairment  Pt reports that his daughter is trying to get him ENT appt  Denies pain in his ears  He is using writing board for communication   Rash  Followed with dermatology  Pt ambulates with power scooter    Past Medical History:  Diagnosis Date   Arthritis    Diverticulosis of colon    External  thrombosed hemorrhoids 06/11/2009   Qualifier: Diagnosis of  By: Clent Ridges MD, Tera Mater    GERD (gastroesophageal reflux disease)    Hypertension    Past Surgical History:  Procedure Laterality Date   cataract extaction, right  6/09   per Dr. Kennedy Bucker   CATARACT EXTRACTION Left    COLONOSCOPY  09/23/04   per Dr. Sherin Quarry, clear, no repeats needed    CORONARY STENT INTERVENTION N/A 06/06/2021   Procedure: CORONARY STENT INTERVENTION;  Surgeon: Yvonne Kendall, MD;  Location: [facility];  Service: Cardiovascular;  Laterality: N/A;   CORONARY/GRAFT ACUTE MI REVASCULARIZATION N/A 06/06/2021   Procedure: Coronary/Graft Acute MI Revascularization;  Surgeon: Yvonne Kendall, MD;  Location: [facility];  Service: Cardiovascular;  Laterality: N/A;   EGD and esophageal diatation,  05-10-11   per Dr. Christella Hartigan     HERNIA REPAIR     LEFT HEART CATH AND CORONARY ANGIOGRAPHY N/A 06/06/2021   Procedure: LEFT HEART CATH AND CORONARY ANGIOGRAPHY;  Surgeon: Yvonne Kendall, MD;  Location: [facility];  Service: Cardiovascular;  Laterality: N/A;   rt inguinal hernia     x2    reports that he has quit smoking. His smoking use included cigarettes. He has a 7.5 pack-year smoking history. He has never used smokeless tobacco. He reports that he does not drink alcohol and does not use drugs. Social History   Socioeconomic History   Marital status: Widowed    Spouse name: Not on file   Number of children: 3  Years of education: Not on file   Highest education level: Not on file  Occupational History   Occupation: retired  Tobacco Use   Smoking status: Former    Current packs/day: 15.00    Average packs/day: 15.0 packs/day for 0.5 years (7.5 ttl pk-yrs)    Types: Cigarettes   Smokeless tobacco: Never   Tobacco comments:    smoked 18 and stopped and states he stopped 30's   Vaping Use   Vaping status: Never Used  Substance and Sexual Activity   Alcohol use: No    Alcohol/week: 0.0  standard drinks of alcohol   Drug use: No   Sexual activity: Never    Birth control/protection: Abstinence  Other Topics Concern   Not on file  Social History Narrative   Lives in studio at [facility]   Widowed since 2009, was married x 57 years.    3 daughters.    Social Drivers of Corporate investment banker Strain: Low Risk  (06/23/2022)   Overall Financial Resource Strain (CARDIA)    Difficulty of Paying Living Expenses: Not hard at all  Food Insecurity: No Food Insecurity (06/23/2022)   Hunger Vital Sign    Worried About Running Out of Food in the Last Year: Never true    Ran Out of Food in the Last Year: Never true  Transportation Needs: No Transportation Needs (06/23/2022)   PRAPARE - Administrator, Civil Service (Medical): No    Lack of Transportation (Non-Medical): No  Physical Activity: Sufficiently Active (06/23/2022)   Exercise Vital Sign    Days of Exercise per Week: 4 days    Minutes of Exercise per Session: 60 min  Stress: No Stress Concern Present (06/23/2022)   [employer] of Occupational Health - Occupational Stress Questionnaire    Feeling of Stress : Not at all  Social Connections: Moderately Integrated (06/23/2022)   Social Connection and Isolation Panel [NHANES]    Frequency of Communication with Friends and Family: More than three times a week    Frequency of Social Gatherings with Friends and Family: More than three times a week    Attends Religious Services: More than 4 times per year    Active Member of [employer] or Organizations: Yes    Attends Banker Meetings: More than 4 times per year    Marital Status: Widowed  Intimate Partner Violence: Not At Risk (06/23/2022)   Humiliation, Afraid, Rape, and Kick questionnaire    Fear of Current or Ex-Partner: No    Emotionally Abused: No    Physically Abused: No    Sexually Abused: No    Functional Status Survey:    Family History  Problem Relation Age of Onset   Coronary  artery disease Father    Colon cancer Neg Hx    Stomach cancer Neg Hx    Esophageal cancer Neg Hx    Pancreatic cancer Neg Hx     Health Maintenance  Topic Date Due   COVID-19 Vaccine (7 - 2024-25 season) 04/06/2023   Medicare Annual Wellness (AWV)  05/18/2024   DTaP/Tdap/Td (3 - Td or Tdap) 07/07/2031   Pneumonia Vaccine 50+ Years old  Completed   INFLUENZA VACCINE  Completed   Zoster Vaccines- Shingrix  Completed   HPV VACCINES  Aged Out   FOOT EXAM  Discontinued   HEMOGLOBIN A1C  Discontinued   OPHTHALMOLOGY EXAM  Discontinued    Allergies  Allergen Reactions   Effexor [Venlafaxine]  Pt became lethargic, drowsy and not his usual self with effexor.   Contrast Media [Iodinated Contrast Media] Rash    Outpatient Encounter Medications as of 05/26/2023  Medication Sig   acetaminophen (TYLENOL) 325 MG tablet Take 650 mg by mouth every 6 (six) hours as needed.   acetaminophen (TYLENOL) 500 MG tablet Take 500 mg by mouth 2 (two) times daily.   apixaban (ELIQUIS) 5 MG TABS tablet Take 1 tablet (5 mg total) by mouth 2 (two) times daily.   atorvastatin (LIPITOR) 40 MG tablet Take 1 tablet (40 mg total) by mouth daily.   clotrimazole-betamethasone (LOTRISONE) cream Apply 1 Application topically 2 (two) times daily as needed. Fax#[phone]   docusate sodium (COLACE) 100 MG capsule Take 100 mg by mouth 2 (two) times daily.   feeding supplement (BOOST HIGH PROTEIN) LIQD Take 1 Container by mouth 2 (two) times daily between meals.   fexofenadine (ALLEGRA) 180 MG tablet Take 180 mg by mouth daily.   fluocinonide cream (LIDEX) 0.05 % Apply 1 Application topically 2 (two) times daily.   fluticasone (FLONASE) 50 MCG/ACT nasal spray Place 2 sprays into both nostrils daily.   furosemide (LASIX) 40 MG tablet Take 1.5 pills daily for 3 days then resume 40 mg daily.   gabapentin (NEURONTIN) 300 MG capsule TAKE 1 CAPSULE BY MOUTH THREE TIMES A DAY   Menthol, Topical Analgesic, (BIOFREEZE  ROLL-ON) 4 % GEL Apply topically. Apply to neck topically two times a day   metoprolol succinate (TOPROL XL) 25 MG 24 hr tablet Take 0.5 tablets (12.5 mg total) by mouth in the morning and at bedtime.   mometasone (ELOCON) 0.1 % cream Apply 1 Application topically daily.   nitroGLYCERIN (NITROSTAT) 0.4 MG SL tablet Place 1 tablet (0.4 mg total) under the tongue every 5 (five) minutes as needed for chest pain.   nystatin (MYCOSTATIN/NYSTOP) powder Apply 1 Application topically 3 (three) times daily.   pantoprazole (PROTONIX) 40 MG tablet Take 1 tablet (40 mg total) by mouth daily.   polyvinyl alcohol (LIQUIFILM TEARS) 1.4 % ophthalmic solution Place 2 drops into the right eye 2 (two) times daily.   potassium chloride SA (KLOR-CON M) 20 MEQ tablet Take 1 tablet (20 mEq total) by mouth daily.   pramoxine (CERAVE ITCH RELIEF) 1 % LOTN Apply 1 Application topically 2 (two) times daily.   zinc oxide 20 % ointment Apply 1 Application topically daily at 12 noon. Apply to buttocks after applying Fluocinonide Cream   No facility-administered encounter medications on file as of 05/26/2023.    Review of Systems  Constitutional:  Negative for fever.  HENT:  Positive for hearing loss.   Respiratory:  Negative for cough, shortness of breath and wheezing.   Cardiovascular:  Negative for chest pain and palpitations.  Gastrointestinal:  Negative for abdominal distention, abdominal pain, blood in stool, constipation, diarrhea, nausea and vomiting.  Genitourinary:  Negative for dysuria.  Neurological:  Negative for dizziness.    There were no vitals filed for this visit. There is no height or weight on file to calculate BMI. Physical Exam Constitutional:      Appearance: Normal appearance.  HENT:     Head: Normocephalic and atraumatic.     Right Ear: There is impacted cerumen.  Cardiovascular:     Rate and Rhythm: Normal rate and regular rhythm.     Pulses: Normal pulses.     Heart sounds: Normal heart  sounds.  Pulmonary:     Effort: No respiratory distress.  Breath sounds: No wheezing or rales.  Abdominal:     General: Bowel sounds are normal. There is no distension.     Palpations: Abdomen is soft.     Tenderness: There is no abdominal tenderness. There is no right CVA tenderness or guarding.  Neurological:     Mental Status: He is alert. Mental status is at baseline.     Motor: No weakness.     Labs reviewed: Basic Metabolic Panel: Recent Labs    03/08/23 0000 03/15/23 0000 05/10/23 0000  NA 132* 134* 131*  K 3.5 4.0 3.7  CL 98* 100 98*  CO2 24* 26* 24*  BUN 7 10 9   CREATININE 0.4* 0.4* 0.5*  CALCIUM 8.7 8.6* 8.6*   Liver Function Tests: Recent Labs    02/08/23 0000 02/15/23 0000 05/10/23 0000  AST 24 25 25   ALT 22 20 22   ALKPHOS 177* 134* 129*  ALBUMIN 3.8 3.5 3.3*   No results for input(s): "LIPASE", "AMYLASE" in the last 8760 hours. No results for input(s): "AMMONIA" in the last 8760 hours. CBC: Recent Labs    02/08/23 0000 03/08/23 0000 05/10/23 0000  WBC 7.7 8.1 9.0  NEUTROABS  --   --  7,344.00  HGB 14.7 13.5 12.3*  HCT 44 40* 37*  PLT 294 300 249   Cardiac Enzymes: No results for input(s): "CKTOTAL", "CKMB", "CKMBINDEX", "TROPONINI" in the last 8760 hours. BNP: Invalid input(s): "POCBNP" Lab Results  Component Value Date   HGBA1C 5.6 11/12/2022   Lab Results  Component Value Date   TSH 2.039 11/02/2021   Lab Results  Component Value Date   VITAMINB12 417 11/12/2022   No results found for: "FOLATE" Lab Results  Component Value Date   FERRITIN 256 11/03/2021    Imaging and Procedures obtained prior to SNF admission: VAS [place] UPPER EXTREMITY ARTERIAL DUPLEX Result Date: 05/16/2023  UPPER EXTREMITY DUPLEX STUDY Patient Name:  Gabriel [name]  Date of Exam:   05/12/2023 Medical Rec #: [id number]        Accession #:    [id number] Date of Birth: Jul 29, 1924        Patient Gender: M Patient Age:   23 years Exam Location:  [facility]  Procedure:      VAS [place] UPPER EXTREMITY ARTERIAL DUPLEX Referring Phys: ANGELA DUKE --------------------------------------------------------------------------------  Indications: Right forearm/hand discolored. History:     Patient has a history of Patient broke right elbow about 14 months              ago, unable to undergo surgery. He is unable to straighten arm due              to prior injury. Limited mobility.  Risk Factors: Hypertension, hyperlipidemia, past history of smoking, prior MI,               coronary artery disease. Limitations: Patient has paroxysmal atrial fibrilliation and on Eliquis. Comparison Study: None Performing Technologist: Alecia Mackin RVT, RDCS (AE), RDMS  Examination Guidelines: A complete evaluation includes B-mode imaging, spectral Doppler, color Doppler, and power Doppler as needed of all accessible portions of each vessel. Bilateral testing is considered an integral part of a complete examination. Limited examinations for reoccurring indications may be performed as noted.  Right Doppler Findings: +---------------+----------+---------+--------+--------+ Site           PSV (cm/s)Waveform StenosisComments +---------------+----------+---------+--------+--------+ Subclavian Prox62        biphasic                  +---------------+----------+---------+--------+--------+  Subclavian Mid 43        biphasic                  +---------------+----------+---------+--------+--------+ Subclavian Dist56        triphasic                 +---------------+----------+---------+--------+--------+ Axillary       76        biphasic                  +---------------+----------+---------+--------+--------+ Brachial Prox  118       biphasic                  +---------------+----------+---------+--------+--------+ Brachial Mid   93        biphasic                  +---------------+----------+---------+--------+--------+ Brachial Dist  66        biphasic                   +---------------+----------+---------+--------+--------+ Radial Prox    58        biphasic                  +---------------+----------+---------+--------+--------+ Radial Mid     45        triphasic                 +---------------+----------+---------+--------+--------+ Radial Dist    45        triphasic                 +---------------+----------+---------+--------+--------+ Ulnar Prox     40        triphasic                 +---------------+----------+---------+--------+--------+ Ulnar Mid      53        triphasic                 +---------------+----------+---------+--------+--------+ Ulnar Dist     56        triphasic                 +---------------+----------+---------+--------+--------+ Obtained compression of right brachial, radial and ulnar veins with no evidence of DVT seen.    Findings reported to Micah Flesher, PA-C at 12:35 pm . Summary:  Right: No obstruction visualized in the right upper extremity. No        DVT seen in right brachial, radial and ulnar veins. *See table(s) above for measurements and observations. Electronically signed by Lorine Bears MD on 05/16/2023 at 7:35:25 AM.    Final     Assessment/Plan  1. Seasonal allergies (Primary) Will hold flonase for a week as per patients request   2. Gastroesophageal reflux disease, unspecified whether esophagitis present Will change protonix to prn   3. Hearing loss, unspecified hearing loss type, unspecified laterality Instructed patient that he need to follow up with ENT for ear lavage and irrigation    Afib  Cont with eliquis No signs of bleeding    30 min Total time spent for obtaining history,  performing a medically appropriate examination and evaluation, reviewing the tests,   documenting clinical information in the electronic or other health record,   ,care coordination (not separately reported)

## 2023-05-26 NOTE — Telephone Encounter (Signed)
  FYI Spoke with pt daughter state that pt has appointment with Audiology for this problem.

## 2023-05-27 ENCOUNTER — Encounter: Payer: Self-pay | Admitting: Sports Medicine

## 2023-05-27 DIAGNOSIS — R1312 Dysphagia, oropharyngeal phase: Secondary | ICD-10-CM | POA: Diagnosis not present

## 2023-05-27 DIAGNOSIS — R29898 Other symptoms and signs involving the musculoskeletal system: Secondary | ICD-10-CM | POA: Diagnosis not present

## 2023-05-27 NOTE — Telephone Encounter (Signed)
Audiologists are not allowed to clean ears, so he needs to see [place] for that

## 2023-05-27 NOTE — Telephone Encounter (Signed)
Spoke with pt daughter state that pt will be seeing an ENT for ear cleaning

## 2023-05-30 IMAGING — DX DG FOOT COMPLETE 3+V*R*
2 series · 3 of 3 positions shown · non-contrast
Comparison: None.

CLINICAL DATA: Right foot pain for 1 week after fall

EXAM:
RIGHT FOOT COMPLETE - 3+ VIEW

[Series 1: foot · 0.14mm/px · 2 of 2 slices shown]
[im 1/2]
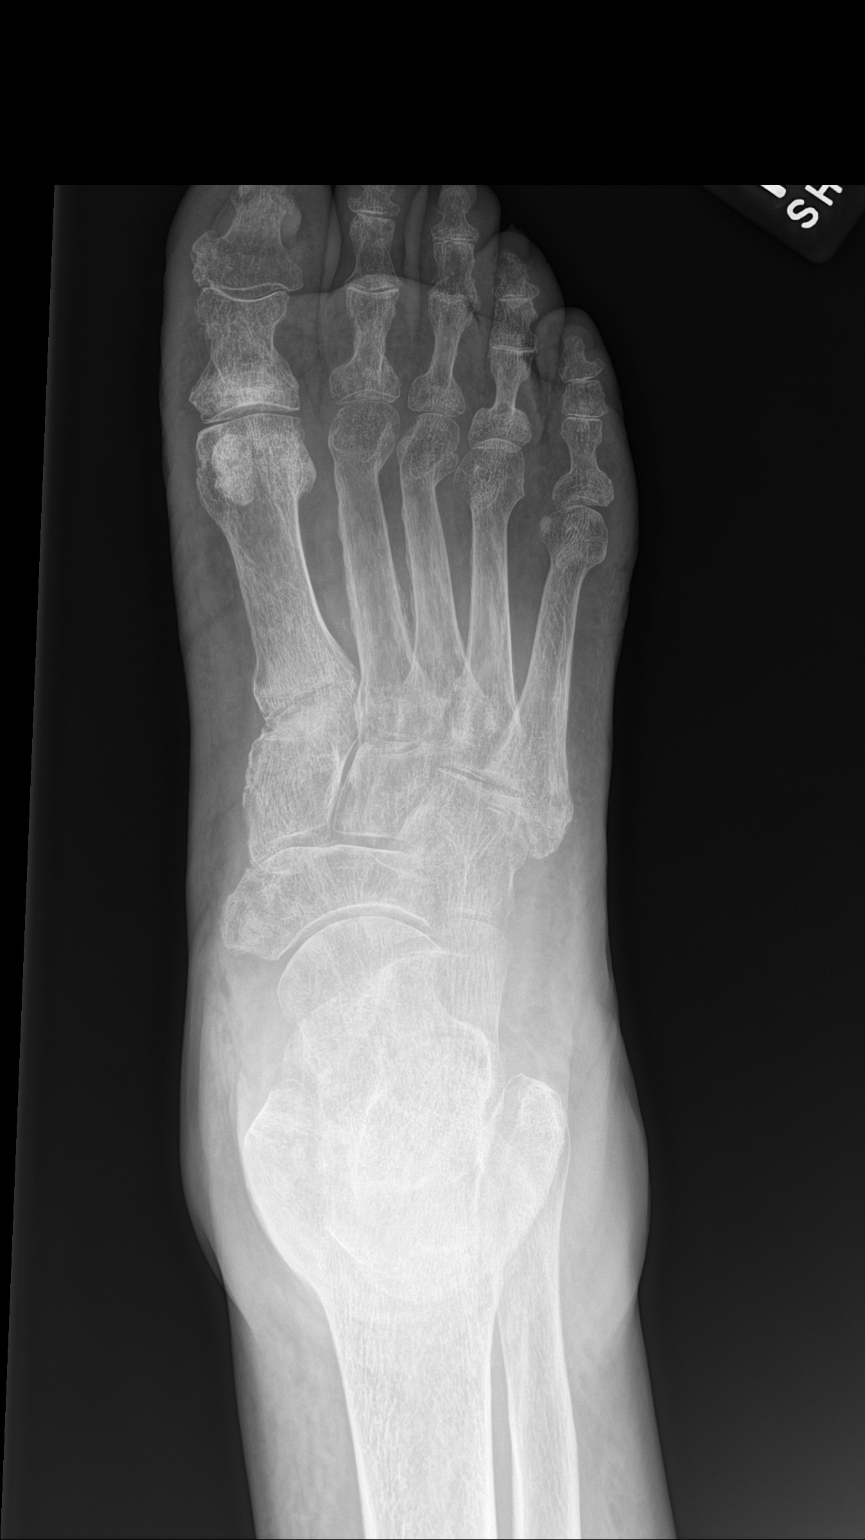
[im 2/2]
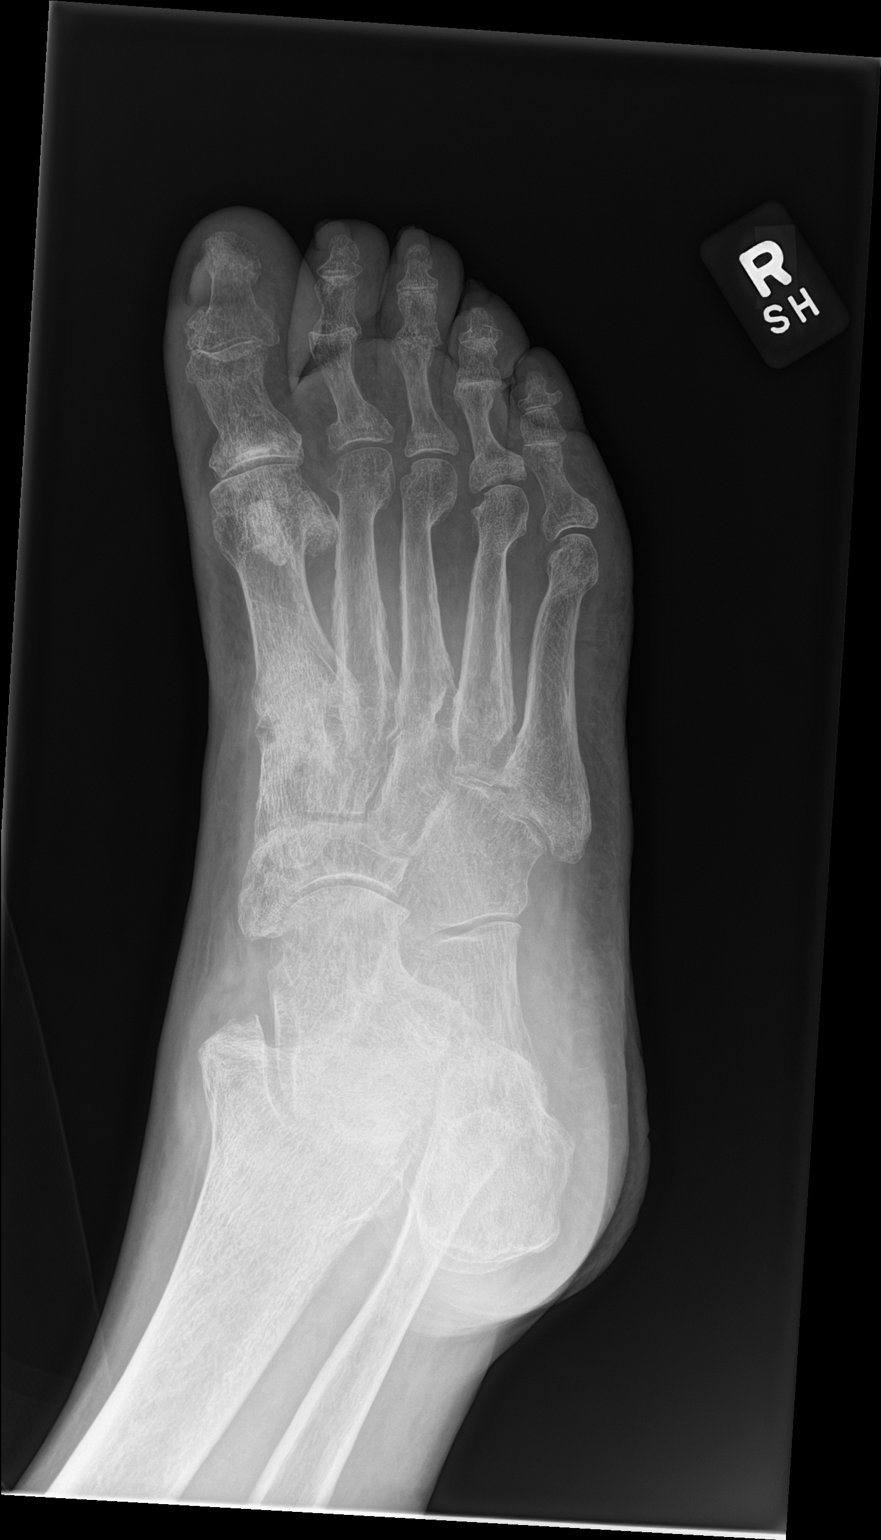

[leg]
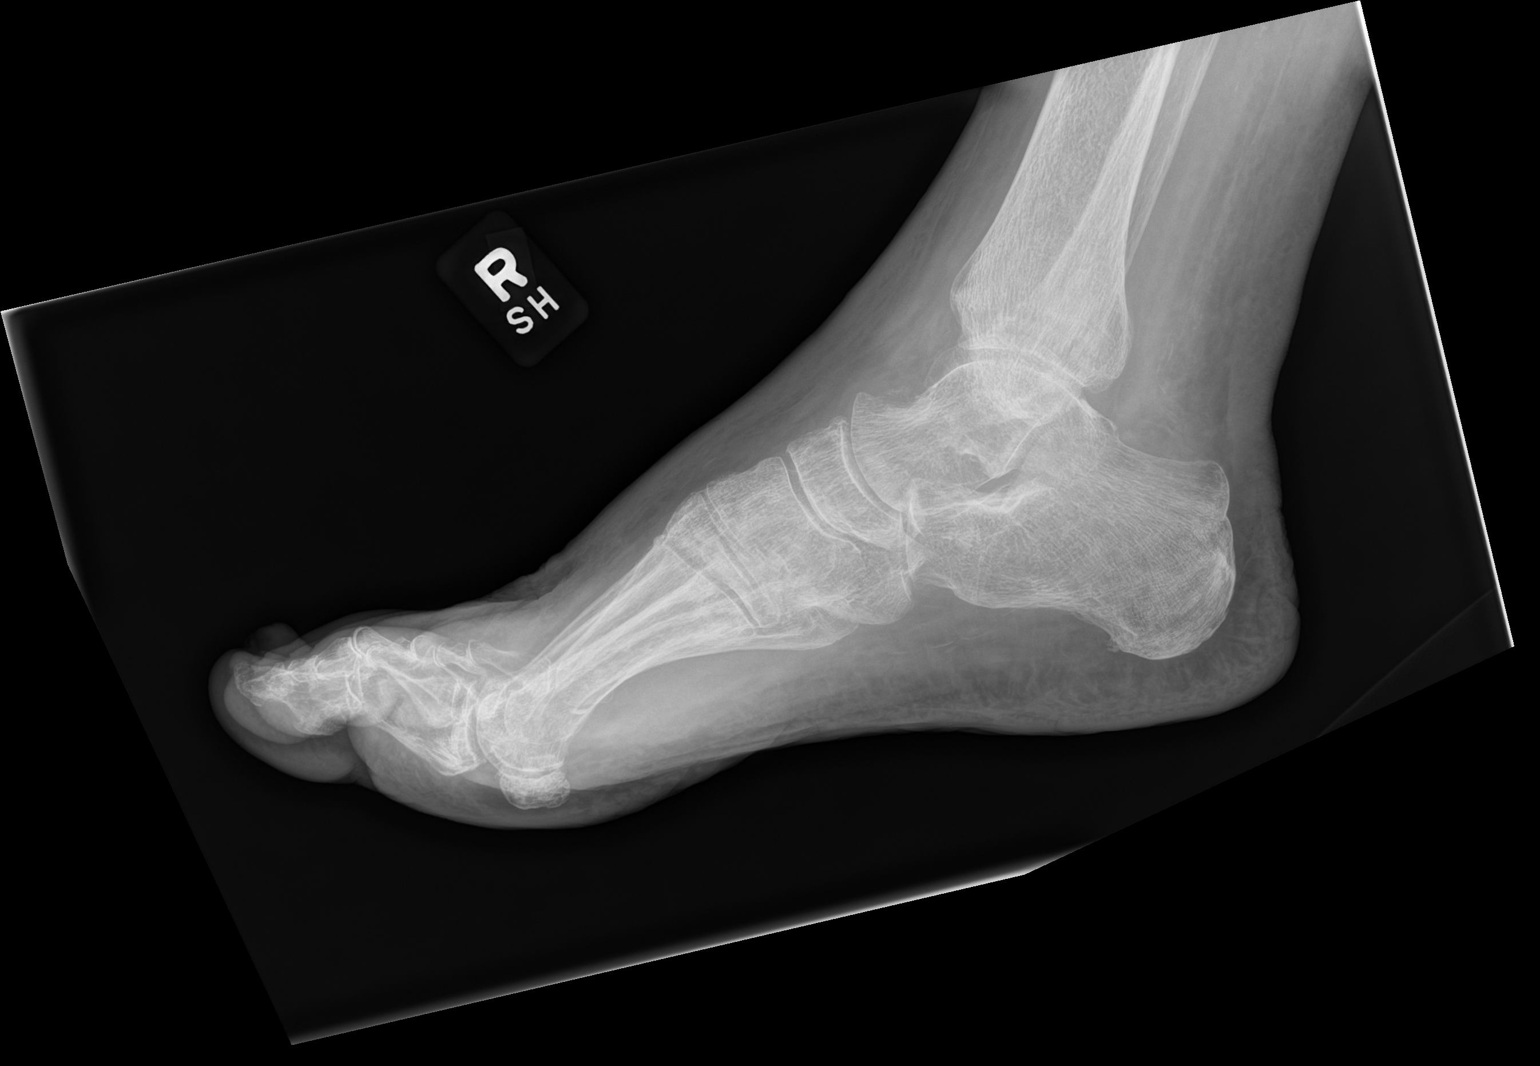

[3 of 3 positions shown; findings below may reference images not displayed]

FINDINGS: Generalized osteopenia to the degree that sensitivity of radiography
is diminished. Chronic appearing deformities at the bases of the
first, second and fourth proximal phalanges. Remote neck fractures
of the second and third metatarsals. No acute fracture or
subluxation.
IMPRESSION: No acute finding.

## 2023-05-31 DIAGNOSIS — R1312 Dysphagia, oropharyngeal phase: Secondary | ICD-10-CM | POA: Diagnosis not present

## 2023-05-31 DIAGNOSIS — R29898 Other symptoms and signs involving the musculoskeletal system: Secondary | ICD-10-CM | POA: Diagnosis not present

## 2023-06-01 ENCOUNTER — Encounter: Payer: Self-pay | Admitting: Nurse Practitioner

## 2023-06-01 ENCOUNTER — Non-Acute Institutional Stay (SKILLED_NURSING_FACILITY): Payer: Self-pay | Admitting: Nurse Practitioner

## 2023-06-01 DIAGNOSIS — I5189 Other ill-defined heart diseases: Secondary | ICD-10-CM

## 2023-06-01 DIAGNOSIS — I480 Paroxysmal atrial fibrillation: Secondary | ICD-10-CM | POA: Diagnosis not present

## 2023-06-01 DIAGNOSIS — I10 Essential (primary) hypertension: Secondary | ICD-10-CM

## 2023-06-01 DIAGNOSIS — E871 Hypo-osmolality and hyponatremia: Secondary | ICD-10-CM

## 2023-06-01 DIAGNOSIS — R1310 Dysphagia, unspecified: Secondary | ICD-10-CM | POA: Diagnosis not present

## 2023-06-01 DIAGNOSIS — G609 Hereditary and idiopathic neuropathy, unspecified: Secondary | ICD-10-CM | POA: Diagnosis not present

## 2023-06-01 DIAGNOSIS — R1312 Dysphagia, oropharyngeal phase: Secondary | ICD-10-CM | POA: Diagnosis not present

## 2023-06-01 DIAGNOSIS — L309 Dermatitis, unspecified: Secondary | ICD-10-CM

## 2023-06-01 DIAGNOSIS — F419 Anxiety disorder, unspecified: Secondary | ICD-10-CM

## 2023-06-01 DIAGNOSIS — R29898 Other symptoms and signs involving the musculoskeletal system: Secondary | ICD-10-CM | POA: Diagnosis not present

## 2023-06-01 NOTE — Assessment & Plan Note (Signed)
 Dysphagia, choked in the past, chopped meat with thin fluid presently. Underwent GI evaluation. CXR normal 03/07/23

## 2023-06-01 NOTE — Assessment & Plan Note (Signed)
 Grade 2 diastolic dysfunction, swelling BLE, SOB, placed on Furosemide  in setting of weight gained, improved, followed by Cardiology

## 2023-06-01 NOTE — Assessment & Plan Note (Signed)
 W/c for mobility, on Gabapentin .

## 2023-06-01 NOTE — Progress Notes (Signed)
 Location:   [facility] Room Number: 57 Place of Service:  SNF (31) Provider: [facility] Tilak Oakley NP  Johnny Garnette LABOR, MD  Patient Care Team: Johnny Garnette LABOR, MD as PCP - General (Family Medicine) Burnard Debby LABOR, MD as PCP - Cardiology (Cardiology) Octavia Charleston, MD as Consulting Physician (Ophthalmology) Elner Arley LABOR, MD as Consulting Physician (Ophthalmology)  Extended Emergency Contact Information Primary Emergency Contact: Garrett,Cathy  [place] Home Phone: [phone] Mobile Phone: [phone] Relation: Daughter Secondary Emergency Contact: Leia Pulling  [place] Home Phone: [phone] Relation: Daughter  Code Status:  DNR Goals of care: Advanced Directive information    05/19/2023   10:23 AM  Advanced Directives  Does Patient Have a Medical Advance Directive? Yes  Type of Estate Agent of [place];Living will  Does patient want to make changes to medical advance directive? No - Patient declined  Copy of Healthcare Power of Attorney in Chart? Yes - validated most recent copy scanned in chart (See row information)     Chief Complaint  Patient presents with   Medical Management of Chronic Issues    HPI:  Pt is a 89 y.o. male seen today for medical management of chronic diseases.    Chronic dermatitis buttocks, followed by dermatology, presently: Nystatin  powder, Fluocinonide  Dysphagia, choked in the past, chopped meat with thin fluid presently. Underwent GI evaluation. CXR normal 03/07/23             Anxiety, failed effexor, stated sleeps well at night.              The right arm pain, more at night, Tylenol  at night helped                03/24/23 right elbow fx(distal end of right humerus), mild swelling fingers, pain is well not controlled, in hinged elbow ROM brace during day, therapy is going to try different brace at night, has been to surgeon-non surgical candidate. The patient desires delaying  X-ray.              CAD ST elevation MI LAD 05/2021, underwent Cath, stenting, s/p percutaneous coronary angioplasty, started DAPT, Echo EF 40-45%,  65-70% 11/02/21, started Plavix  12/24/21 per [facility]             PE 11/02/21, had CTA 05/12/22, 09/08/22 Cardiology dc Eliquis , start ASA 81mg  qd, continue Plavix  75mg  qd. Repeated CT @ 6 mos,  negative PE             Grade 2 diastolic dysfunction, swelling BLE, SOB, placed on Furosemide  in setting of weight gained, improved, followed by Cardiology             Afib, resumed Eliquis  and dc'd ASA/Plavix  03/2023 per cardiology, on  Metoprolol              GERD, thing liquid, hx of dilatation,  takes Pantoprazole , Hgb 12.3 05/10/23             Hypokalemia, on Kcl K 3.7 05/10/23             Hyponatremia, Na 131 05/10/23             HLD on Atorvastatin , LDL 30 11/12/22             HTN, on Metoprolol              Peripheral neuropathy, on Gabapentin     Past Medical History:  Diagnosis Date   Arthritis    Diverticulosis of  colon    External thrombosed hemorrhoids 06/11/2009   Qualifier: Diagnosis of  By: Johnny MD, Garnette LABOR    GERD (gastroesophageal reflux disease)    Hypertension    Past Surgical History:  Procedure Laterality Date   cataract extaction, right  6/09   per Dr. Lorrene   CATARACT EXTRACTION Left    COLONOSCOPY  09/23/04   per Dr. Nolon, clear, no repeats needed    CORONARY STENT INTERVENTION N/A 06/06/2021   Procedure: CORONARY STENT INTERVENTION;  Surgeon: Mady Bruckner, MD;  Location: [facility];  Service: Cardiovascular;  Laterality: N/A;   CORONARY/GRAFT ACUTE MI REVASCULARIZATION N/A 06/06/2021   Procedure: Coronary/Graft Acute MI Revascularization;  Surgeon: Mady Bruckner, MD;  Location: [facility];  Service: Cardiovascular;  Laterality: N/A;   EGD and esophageal diatation,  05-10-11   per Dr. Teressa     HERNIA REPAIR     LEFT HEART CATH AND CORONARY ANGIOGRAPHY N/A 06/06/2021   Procedure: LEFT HEART CATH AND  CORONARY ANGIOGRAPHY;  Surgeon: Mady Bruckner, MD;  Location: [facility];  Service: Cardiovascular;  Laterality: N/A;   rt inguinal hernia     x2    Allergies  Allergen Reactions   Effexor [Venlafaxine]     Pt became lethargic, drowsy and not his usual self with effexor.   Contrast Media [Iodinated Contrast Media] Rash    Allergies as of 06/01/2023       Reactions   Effexor [venlafaxine]    Pt became lethargic, drowsy and not his usual self with effexor.   Contrast Media [iodinated Contrast Media] Rash        Medication List        Accurate as of June 01, 2023 11:59 PM. If you have any questions, ask your nurse or doctor.          acetaminophen  325 MG tablet Commonly known as: TYLENOL  Take 650 mg by mouth every 6 (six) hours as needed.   acetaminophen  500 MG tablet Commonly known as: TYLENOL  Take 500 mg by mouth 2 (two) times daily.   apixaban  5 MG Tabs tablet Commonly known as: ELIQUIS  Take 1 tablet (5 mg total) by mouth 2 (two) times daily.   atorvastatin  40 MG tablet Commonly known as: LIPITOR Take 1 tablet (40 mg total) by mouth daily.   Biofreeze Roll-On 4 % Gel Generic drug: Menthol (Topical Analgesic) Apply topically. Apply to neck topically two times a day   CeraVe Itch Relief 1 % Lotn Generic drug: pramoxine Apply 1 Application topically 2 (two) times daily.   clotrimazole -betamethasone  cream Commonly known as: LOTRISONE  Apply 1 Application topically 2 (two) times daily as needed. Fax#[phone]   docusate sodium 100 MG capsule Commonly known as: COLACE Take 100 mg by mouth 2 (two) times daily.   feeding supplement Liqd Take 1 Container by mouth 2 (two) times daily between meals.   fexofenadine  180 MG tablet Commonly known as: ALLEGRA  Take 180 mg by mouth daily.   fluocinonide  cream 0.05 % Commonly known as: LIDEX  Apply 1 Application topically 2 (two) times daily.   fluticasone  50 MCG/ACT nasal spray Commonly known as:  FLONASE  Place 2 sprays into both nostrils daily.   furosemide  40 MG tablet Commonly known as: LASIX  Take 1.5 pills daily for 3 days then resume 40 mg daily.   gabapentin  300 MG capsule Commonly known as: NEURONTIN  TAKE 1 CAPSULE BY MOUTH THREE TIMES A DAY   metoprolol  succinate 25 MG 24 hr tablet Commonly  known as: Toprol  XL Take 0.5 tablets (12.5 mg total) by mouth in the morning and at bedtime.   mometasone 0.1 % cream Commonly known as: ELOCON Apply 1 Application topically daily.   nitroGLYCERIN  0.4 MG SL tablet Commonly known as: Nitrostat  Place 1 tablet (0.4 mg total) under the tongue every 5 (five) minutes as needed for chest pain.   nystatin  powder Commonly known as: MYCOSTATIN /NYSTOP  Apply 1 Application topically 3 (three) times daily.   pantoprazole  40 MG tablet Commonly known as: PROTONIX  Take 1 tablet (40 mg total) by mouth daily.   polyvinyl alcohol 1.4 % ophthalmic solution Commonly known as: LIQUIFILM TEARS Place 2 drops into the right eye 2 (two) times daily.   potassium chloride  SA 20 MEQ tablet Commonly known as: KLOR-CON  M Take 1 tablet (20 mEq total) by mouth daily.   zinc oxide 20 % ointment Apply 1 Application topically daily at 12 noon. Apply to buttocks after applying Fluocinonide  Cream        Review of Systems  Constitutional:  Negative for appetite change, fatigue and fever.  HENT:  Positive for hearing loss and trouble swallowing. Negative for congestion, postnasal drip and rhinorrhea.   Eyes:  Negative for visual disturbance.  Respiratory:  Negative for cough, shortness of breath and wheezing.   Cardiovascular:  Positive for leg swelling.  Gastrointestinal:  Negative for abdominal pain and constipation.  Genitourinary:  Negative for dysuria, frequency and urgency.       Incontinent of urine  Musculoskeletal:  Positive for arthralgias and gait problem. Negative for joint swelling.       R arm pain.   Skin:  Positive for rash.   Neurological:  Negative for speech difficulty, weakness and headaches.  Psychiatric/Behavioral:  Negative for confusion and sleep disturbance. The patient is not nervous/anxious.     Immunization History  Administered Date(s) Administered   Fluad Quad(high Dose 65+) 02/15/2022, 02/08/2023   Influenza Split 01/20/2012, 01/07/2013   Influenza Whole 02/09/2006, 01/11/2008   Influenza, High Dose Seasonal PF 12/30/2016, 12/27/2017, 01/08/2019   Influenza,inj,quad, With Preservative 12/29/2018   Influenza-Unspecified 01/17/2014, 01/14/2016, 12/30/2016   Moderna Covid-19 Fall Seasonal Vaccine 37yrs & older 03/22/2022, 02/09/2023   PFIZER Comirnaty(Gray Top)Covid-19 Tri-Sucrose Vaccine 05/28/2019, 03/04/2020, 09/23/2020   Pfizer Covid-19 Vaccine Bivalent Booster 26yrs & up 02/25/2022   Pneumococcal Conjugate-13 09/23/2015   Pneumococcal Polysaccharide-23 03/13/2008   Td 12/20/2007   Tdap 07/06/2021   Unspecified SARS-COV-2 Vaccination 04/30/2019   Zoster Recombinant(Shingrix) 03/11/2020, 06/20/2020   Pertinent  Health Maintenance Due  Topic Date Due   INFLUENZA VACCINE  Completed   FOOT EXAM  Discontinued   HEMOGLOBIN A1C  Discontinued   OPHTHALMOLOGY EXAM  Discontinued      05/04/2022    2:44 PM 06/01/2022   11:04 AM 06/15/2022    9:14 AM 06/23/2022    3:12 PM 10/19/2022   11:19 AM  Fall Risk  Falls in the past year? 1 1 1 1  Exclusion - non ambulatory  Was there an injury with Fall? 1 1 1 1    Was there an injury with Fall? - Comments    Fx Rt Ekbow. Followed by Orthopedic   Fall Risk Category Calculator 3 3 3 2    Fall Risk Category (Retired) High      (RETIRED) Patient Fall Risk Level High fall risk      Patient at Risk for Falls Due to History of fall(s);Impaired balance/gait;Impaired mobility;Impaired vision History of fall(s);Impaired balance/gait;Impaired mobility;Impaired vision History of fall(s);Impaired balance/gait;Impaired mobility Orthopedic  patient   Fall risk Follow up  Falls evaluation completed Falls evaluation completed Falls evaluation completed Falls prevention discussed Falls evaluation completed   Functional Status Survey:    Vitals:   06/01/23 1600 06/02/23 1049  BP: (!) 162/87 (!) 162/87  Pulse: (!) 50   Resp: 18   Temp: (!) 97.2 F (36.2 C)   SpO2: 96%   Weight: 160 lb 3.2 oz (72.7 kg)    Body mass index is 25.86 kg/m. Physical Exam Vitals and nursing note reviewed.  Constitutional:      Appearance: Normal appearance.  HENT:     Head: Normocephalic.     Comments:      Nose: Nose normal. No congestion.     Mouth/Throat:     Mouth: Mucous membranes are moist.  Eyes:     Extraocular Movements: Extraocular movements intact.     Conjunctiva/sclera: Conjunctivae normal.     Pupils: Pupils are equal, round, and reactive to light.  Cardiovascular:     Rate and Rhythm: Normal rate. Rhythm irregular.     Heart sounds: No murmur heard. Pulmonary:     Effort: Pulmonary effort is normal.     Breath sounds: Rales present.     Comments: Bibasilar rales. Central congestion Abdominal:     General: Bowel sounds are normal.     Palpations: Abdomen is soft.     Tenderness: There is no abdominal tenderness.  Musculoskeletal:        General: No tenderness.     Cervical back: Normal range of motion and neck supple.     Right lower leg: Edema present.     Left lower leg: Edema present.     Comments: Right elbow in a hinged ROM brace, able to make a fist, pain at night, chronic neck pain-positional.  Swelling BLE minimal  Skin:    General: Skin is warm and dry.     Findings: Erythema and rash present.     Comments: Chronic perineal/buttocks skin irritation from moist and heat: dark reddened buttocks   Neurological:     General: No focal deficit present.     Mental Status: He is alert and oriented to person, place, and time. Mental status is at baseline.     Gait: Gait abnormal.     Comments: W/c for mobility.   Psychiatric:        Mood  and Affect: Mood normal.        Behavior: Behavior normal.        Thought Content: Thought content normal.     Labs reviewed: Recent Labs    03/08/23 0000 03/15/23 0000 05/10/23 0000  NA 132* 134* 131*  K 3.5 4.0 3.7  CL 98* 100 98*  CO2 24* 26* 24*  BUN 7 10 9   CREATININE 0.4* 0.4* 0.5*  CALCIUM  8.7 8.6* 8.6*   Recent Labs    02/08/23 0000 02/15/23 0000 05/10/23 0000  AST 24 25 25   ALT 22 20 22   ALKPHOS 177* 134* 129*  ALBUMIN 3.8 3.5 3.3*   Recent Labs    02/08/23 0000 03/08/23 0000 05/10/23 0000  WBC 7.7 8.1 9.0  NEUTROABS  --   --  7,344.00  HGB 14.7 13.5 12.3*  HCT 44 40* 37*  PLT 294 300 249   Lab Results  Component Value Date   TSH 2.039 11/02/2021   Lab Results  Component Value Date   HGBA1C 5.6 11/12/2022   Lab Results  Component Value Date   CHOL  79 11/12/2022   HDL 33 (A) 11/12/2022   LDLCALC 30 11/12/2022   LDLDIRECT 72.0 09/27/2017   TRIG 80 11/12/2022   CHOLHDL 2 10/14/2021    Significant Diagnostic Results in last 30 days:  VAS US  UPPER EXTREMITY ARTERIAL DUPLEX Result Date: 05/16/2023  UPPER EXTREMITY DUPLEX STUDY Patient Name:  Gabriel [name]  Date of Exam:   05/12/2023 Medical Rec #: [id number]        Accession #:    [id number] Date of Birth: [birth year]        Patient Gender: M Patient Age:   89 years Exam Location:  [facility] Procedure:      VAS US  UPPER EXTREMITY ARTERIAL DUPLEX Referring Phys: ANGELA DUKE --------------------------------------------------------------------------------  Indications: Right forearm/hand discolored. History:     Patient has a history of Patient broke right elbow about 14 months              ago, unable to undergo surgery. He is unable to straighten arm due              to prior injury. Limited mobility.  Risk Factors: Hypertension, hyperlipidemia, past history of smoking, prior MI,               coronary artery disease. Limitations: Patient has paroxysmal atrial fibrilliation and on Eliquis . Comparison  Study: None Performing Technologist: Alecia Mackin RVT, RDCS (AE), RDMS  Examination Guidelines: A complete evaluation includes B-mode imaging, spectral Doppler, color Doppler, and power Doppler as needed of all accessible portions of each vessel. Bilateral testing is considered an integral part of a complete examination. Limited examinations for reoccurring indications may be performed as noted.  Right Doppler Findings: +---------------+----------+---------+--------+--------+ Site           PSV (cm/s)Waveform StenosisComments +---------------+----------+---------+--------+--------+ Subclavian Prox62        biphasic                  +---------------+----------+---------+--------+--------+ Subclavian Mid 43        biphasic                  +---------------+----------+---------+--------+--------+ Subclavian Dist56        triphasic                 +---------------+----------+---------+--------+--------+ Axillary       76        biphasic                  +---------------+----------+---------+--------+--------+ Brachial Prox  118       biphasic                  +---------------+----------+---------+--------+--------+ Brachial Mid   93        biphasic                  +---------------+----------+---------+--------+--------+ Brachial Dist  66        biphasic                  +---------------+----------+---------+--------+--------+ Radial Prox    58        biphasic                  +---------------+----------+---------+--------+--------+ Radial Mid     45        triphasic                 +---------------+----------+---------+--------+--------+ Radial Dist    45        triphasic                 +---------------+----------+---------+--------+--------+  Ulnar Prox     40        triphasic                 +---------------+----------+---------+--------+--------+ Ulnar Mid      53        triphasic                  +---------------+----------+---------+--------+--------+ Ulnar Dist     56        triphasic                 +---------------+----------+---------+--------+--------+ Obtained compression of right brachial, radial and ulnar veins with no evidence of DVT seen.    Findings reported to Jon Hails, PA-C at 12:35 pm . Summary:  Right: No obstruction visualized in the right upper extremity. No        DVT seen in right brachial, radial and ulnar veins. *See table(s) above for measurements and observations. Electronically signed by Deatrice Cage MD on 05/16/2023 at 7:35:25 AM.    Final     Assessment/Plan  Dermatitis Chronic dermatitis buttocks, followed by dermatology, presently: Nystatin  powder, Fluocinonide  Will cover buttocks reddened areas with Calmoseptine oint Increase Tylenol  500mg  tid for better pain control.   Dysphagia Dysphagia, choked in the past, chopped meat with thin fluid presently. Underwent GI evaluation. CXR normal 03/07/23  Anxiety failed effexor, stated sleeps well at night.   Diastolic dysfunction Grade 2 diastolic dysfunction, swelling BLE, SOB, placed on Furosemide  in setting of weight gained, improved, followed by Cardiology  Paroxysmal atrial fibrillation [facility]) resumed Eliquis  per cardiology, on  Metoprolol   Hyponatremia  Na 131 05/10/23  Essential hypertension Blood pressure is loose controlled, intermittent elevated Sbp, on Metoprolol   Neuropathy, peripheral W/c for mobility, on Gabapentin .    Family/ staff Communication: plan of care reviewed with the patient and charge nurse.   Labs/tests ordered:  none

## 2023-06-01 NOTE — Assessment & Plan Note (Signed)
 Na 131 05/10/23

## 2023-06-01 NOTE — Assessment & Plan Note (Addendum)
 Blood pressure is loose controlled, intermittent elevated Sbp, on Metoprolol 

## 2023-06-01 NOTE — Assessment & Plan Note (Signed)
 failed effexor, stated sleeps well at night.

## 2023-06-01 NOTE — Assessment & Plan Note (Signed)
 resumed Eliquis  per cardiology, on  Metoprolol 

## 2023-06-01 NOTE — Assessment & Plan Note (Addendum)
 Chronic dermatitis buttocks, followed by dermatology, presently: Nystatin  powder, Fluocinonide  Will cover buttocks reddened areas with Calmoseptine oint Increase Tylenol  500mg  tid for better pain control.

## 2023-06-02 DIAGNOSIS — R29898 Other symptoms and signs involving the musculoskeletal system: Secondary | ICD-10-CM | POA: Diagnosis not present

## 2023-06-02 DIAGNOSIS — R1312 Dysphagia, oropharyngeal phase: Secondary | ICD-10-CM | POA: Diagnosis not present

## 2023-06-03 DIAGNOSIS — R29898 Other symptoms and signs involving the musculoskeletal system: Secondary | ICD-10-CM | POA: Diagnosis not present

## 2023-06-03 DIAGNOSIS — R1312 Dysphagia, oropharyngeal phase: Secondary | ICD-10-CM | POA: Diagnosis not present

## 2023-06-06 DIAGNOSIS — R29898 Other symptoms and signs involving the musculoskeletal system: Secondary | ICD-10-CM | POA: Diagnosis not present

## 2023-06-06 DIAGNOSIS — R1312 Dysphagia, oropharyngeal phase: Secondary | ICD-10-CM | POA: Diagnosis not present

## 2023-06-06 DIAGNOSIS — H6121 Impacted cerumen, right ear: Secondary | ICD-10-CM | POA: Diagnosis not present

## 2023-06-06 DIAGNOSIS — H93293 Other abnormal auditory perceptions, bilateral: Secondary | ICD-10-CM | POA: Diagnosis not present

## 2023-06-08 DIAGNOSIS — R1312 Dysphagia, oropharyngeal phase: Secondary | ICD-10-CM | POA: Diagnosis not present

## 2023-06-08 DIAGNOSIS — R29898 Other symptoms and signs involving the musculoskeletal system: Secondary | ICD-10-CM | POA: Diagnosis not present

## 2023-06-09 DIAGNOSIS — R29898 Other symptoms and signs involving the musculoskeletal system: Secondary | ICD-10-CM | POA: Diagnosis not present

## 2023-06-09 DIAGNOSIS — R1312 Dysphagia, oropharyngeal phase: Secondary | ICD-10-CM | POA: Diagnosis not present

## 2023-06-10 DIAGNOSIS — R1312 Dysphagia, oropharyngeal phase: Secondary | ICD-10-CM | POA: Diagnosis not present

## 2023-06-10 DIAGNOSIS — R29898 Other symptoms and signs involving the musculoskeletal system: Secondary | ICD-10-CM | POA: Diagnosis not present

## 2023-06-13 DIAGNOSIS — R1312 Dysphagia, oropharyngeal phase: Secondary | ICD-10-CM | POA: Diagnosis not present

## 2023-06-13 DIAGNOSIS — R29898 Other symptoms and signs involving the musculoskeletal system: Secondary | ICD-10-CM | POA: Diagnosis not present

## 2023-06-14 DIAGNOSIS — R29898 Other symptoms and signs involving the musculoskeletal system: Secondary | ICD-10-CM | POA: Diagnosis not present

## 2023-06-14 DIAGNOSIS — R1312 Dysphagia, oropharyngeal phase: Secondary | ICD-10-CM | POA: Diagnosis not present

## 2023-06-15 DIAGNOSIS — R1312 Dysphagia, oropharyngeal phase: Secondary | ICD-10-CM | POA: Diagnosis not present

## 2023-06-15 DIAGNOSIS — R29898 Other symptoms and signs involving the musculoskeletal system: Secondary | ICD-10-CM | POA: Diagnosis not present

## 2023-06-16 DIAGNOSIS — R1312 Dysphagia, oropharyngeal phase: Secondary | ICD-10-CM | POA: Diagnosis not present

## 2023-06-16 DIAGNOSIS — R29898 Other symptoms and signs involving the musculoskeletal system: Secondary | ICD-10-CM | POA: Diagnosis not present

## 2023-06-20 DIAGNOSIS — R1312 Dysphagia, oropharyngeal phase: Secondary | ICD-10-CM | POA: Diagnosis not present

## 2023-06-20 DIAGNOSIS — R29898 Other symptoms and signs involving the musculoskeletal system: Secondary | ICD-10-CM | POA: Diagnosis not present

## 2023-06-21 DIAGNOSIS — R29898 Other symptoms and signs involving the musculoskeletal system: Secondary | ICD-10-CM | POA: Diagnosis not present

## 2023-06-21 DIAGNOSIS — R1312 Dysphagia, oropharyngeal phase: Secondary | ICD-10-CM | POA: Diagnosis not present

## 2023-06-21 NOTE — Progress Notes (Unsigned)
 Cardiology Office Note:    Date:  06/23/2023   ID:  Gabriel [name], DOB Dec 16, 1924, MRN [id number]  PCP:  Nelwyn Salisbury, MD   [facility] Providers Cardiologist:  Little Ishikawa, MD Cardiology APP:  Marcelino Duster, [place]   Previously Dr. Tresa Endo patient  Referring MD: Nelwyn Salisbury, MD   Chief Complaint  Patient presents with   Follow-up    PAF    History of Present Illness:    Gabriel [name] is a 89 y.o. male with a hx of HTN, GERD, and CAD with STEMI Feb 2023, chronic systolic and diastolic heart failure, hx of PE and PAF. LHC with thrombotic occlusion of 90% in the mid LADwith 99% subtotal occlusion in the mid-distal LAD and otherwise nonobstructive disease. DES x 2 non-overlapping 3.0 x 12 mm and 2.0 x 15 mm in the LAD. He was treated with DAPT with ASA and brilinta. Echo with LVEF 40-45%.   Hospitalization 10/2021 with COVID 19 and PE started on eliquis and continued on brilinta, ASA discontinued. Echo at that time showed LVEF 65-70% an dgrade 2 DD. Given falls, repeat CTA in 04/2022 showed no PE and he was taken off eliquis after 6 months of therapy. He remained on ASA and plavix given LAD stent.   He was last seen and by Dr. Tresa Endo 03/2023.  He now resides in skilled nursing facility.  He milligram walks but uses a wheelchair.  EKG at that time showed atrial fibrillation.  He was continued on Eliquis and DAPT was discontinued.  I saw him in clinic 05/12/2023.  He apparently presented for follow-up of a chest x-ray for which I could not see.  Daughter describes a chest x-ray showing congestion but no pneumonia.  He described a 20 pound weight gain in 1 month since seeing Dr. Tresa Endo.  He has shortness of breath and orthopnea and was placed on O2 2 days ago.  He reports improvement with 40 mg Lasix.  I was concerned about heart failure exacerbation due to newly recognized A-fib 03/2023.  He is rate controlled.  Plan was to symptomatically manage his heart failure  and rate control his A-fib.  If he continues to have issues with heart failure with escalating doses of Lasix, could consider amiodarone after adequate anticoagulation.  He is not a cardioversion candidate due to need for sedation.  In addition, on that exam I found his fingers blue and cold on the right hand with an intact radial pulse.  He was able to get an arterial Doppler which ruled out vascular compromise on that side.  He has had a long history of elbow fracture and is able to rub his fingers to relieve the blueness.  Symptoms worsen when his arm hangs down.  He denied pain.  Ultrasound tech also reported no concern for DVT.  Patient is very HOH in visits are primarily conducted by writing on a white board.  He is also very stoic, symptoms can be difficult.  I continue to 40 mg of Lasix daily indefinitely with continue 20 mEq potassium.  He returns today for follow-up. He has had his hearing aids fixed. He is now at his dry weight at 155 lbs. He has been having productive cough x 2 months and congestion. He states he has not had treatment for this. Progress note suggests not on lasix, med list suggests he is still taking lasix. I will collect CMP and BNP today. Rhonchi and crackles in bilaterally, needs  a CXR. Will collect CBC.    Past Medical History:  Diagnosis Date   Arthritis    Diverticulosis of colon    External thrombosed hemorrhoids 06/11/2009   Qualifier: Diagnosis of  By: Clent Ridges MD, Tera Mater    GERD (gastroesophageal reflux disease)    Hypertension     Past Surgical History:  Procedure Laterality Date   cataract extaction, right  6/09   per Dr. Kennedy Bucker   CATARACT EXTRACTION Left    COLONOSCOPY  09/23/04   per Dr. Sherin Quarry, clear, no repeats needed    CORONARY STENT INTERVENTION N/A 06/06/2021   Procedure: CORONARY STENT INTERVENTION;  Surgeon: Yvonne Kendall, MD;  Location: [facility];  Service: Cardiovascular;  Laterality: N/A;   CORONARY/GRAFT ACUTE MI  REVASCULARIZATION N/A 06/06/2021   Procedure: Coronary/Graft Acute MI Revascularization;  Surgeon: Yvonne Kendall, MD;  Location: [facility];  Service: Cardiovascular;  Laterality: N/A;   EGD and esophageal diatation,  05-10-11   per Dr. Christella Hartigan     HERNIA REPAIR     LEFT HEART CATH AND CORONARY ANGIOGRAPHY N/A 06/06/2021   Procedure: LEFT HEART CATH AND CORONARY ANGIOGRAPHY;  Surgeon: Yvonne Kendall, MD;  Location: [facility];  Service: Cardiovascular;  Laterality: N/A;   rt inguinal hernia     x2    Current Medications: Current Meds  Medication Sig   acetaminophen (TYLENOL) 325 MG tablet Take 650 mg by mouth every 6 (six) hours as needed.   acetaminophen (TYLENOL) 500 MG tablet Take 500 mg by mouth 2 (two) times daily.   apixaban (ELIQUIS) 5 MG TABS tablet Take 1 tablet (5 mg total) by mouth 2 (two) times daily.   atorvastatin (LIPITOR) 40 MG tablet Take 1 tablet (40 mg total) by mouth daily.   clotrimazole-betamethasone (LOTRISONE) cream Apply 1 Application topically 2 (two) times daily as needed. Fax#[phone]   docusate sodium (COLACE) 100 MG capsule Take 100 mg by mouth 2 (two) times daily.   feeding supplement (BOOST HIGH PROTEIN) LIQD Take 1 Container by mouth 2 (two) times daily between meals.   fexofenadine (ALLEGRA) 180 MG tablet Take 180 mg by mouth daily.   fluocinonide cream (LIDEX) 0.05 % Apply 1 Application topically 2 (two) times daily.   fluticasone (FLONASE) 50 MCG/ACT nasal spray Place 2 sprays into both nostrils daily.   gabapentin (NEURONTIN) 300 MG capsule TAKE 1 CAPSULE BY MOUTH THREE TIMES A DAY   Menthol, Topical Analgesic, (BIOFREEZE ROLL-ON) 4 % GEL Apply topically. Apply to neck topically two times a day   metoprolol succinate (TOPROL XL) 25 MG 24 hr tablet Take 0.5 tablets (12.5 mg total) by mouth in the morning and at bedtime.   mometasone (ELOCON) 0.1 % cream Apply 1 Application topically daily.   nitroGLYCERIN (NITROSTAT) 0.4 MG SL  tablet Place 1 tablet (0.4 mg total) under the tongue every 5 (five) minutes as needed for chest pain.   nystatin (MYCOSTATIN/NYSTOP) powder Apply 1 Application topically 3 (three) times daily.   pantoprazole (PROTONIX) 40 MG tablet Take 1 tablet (40 mg total) by mouth daily.   polyvinyl alcohol (LIQUIFILM TEARS) 1.4 % ophthalmic solution Place 2 drops into the right eye 2 (two) times daily.   potassium chloride SA (KLOR-CON M) 20 MEQ tablet Take 1 tablet (20 mEq total) by mouth daily.   pramoxine (CERAVE ITCH RELIEF) 1 % LOTN Apply 1 Application topically 2 (two) times daily.   zinc oxide 20 % ointment Apply 1 Application topically daily at  12 noon. Apply to buttocks after applying Fluocinonide Cream     Allergies:   Effexor [venlafaxine] and Contrast media [iodinated contrast media]   Social History   Socioeconomic History   Marital status: Widowed    Spouse name: Not on file   Number of children: 3   Years of education: Not on file   Highest education level: Not on file  Occupational History   Occupation: retired  Tobacco Use   Smoking status: Former    Current packs/day: 15.00    Average packs/day: 15.0 packs/day for 0.5 years (7.5 ttl pk-yrs)    Types: Cigarettes   Smokeless tobacco: Never   Tobacco comments:    smoked 18 and stopped and states he stopped 30's   Vaping Use   Vaping status: Never Used  Substance and Sexual Activity   Alcohol use: No    Alcohol/week: 0.0 standard drinks of alcohol   Drug use: No   Sexual activity: Never    Birth control/protection: Abstinence  Other Topics Concern   Not on file  Social History Narrative   Lives in studio at [facility]   Widowed since 2009, was married x 57 years.    3 daughters.    Social Drivers of Corporate investment banker Strain: Low Risk  (06/23/2022)   Overall Financial Resource Strain (CARDIA)    Difficulty of Paying Living Expenses: Not hard at all  Food Insecurity: Low Risk  (06/06/2023)   Received from  [facility]   Hunger Vital Sign    Worried About Running Out of Food in the Last Year: Never true    Ran Out of Food in the Last Year: Never true  Transportation Needs: No Transportation Needs (06/06/2023)   Received from [facility]    In the past 12 months, has lack of reliable transportation kept you from medical appointments, meetings, work or from getting things needed for daily living? : No  Physical Activity: Sufficiently Active (06/23/2022)   Exercise Vital Sign    Days of Exercise per Week: 4 days    Minutes of Exercise per Session: 60 min  Stress: No Stress Concern Present (06/23/2022)   [employer] of Occupational Health - Occupational Stress Questionnaire    Feeling of Stress : Not at all  Social Connections: Moderately Integrated (06/23/2022)   Social Connection and Isolation Panel [NHANES]    Frequency of Communication with Friends and Family: More than three times a week    Frequency of Social Gatherings with Friends and Family: More than three times a week    Attends Religious Services: More than 4 times per year    Active Member of [employer] or Organizations: Yes    Attends Banker Meetings: More than 4 times per year    Marital Status: Widowed     Family History: The patient's family history includes Coronary artery disease in his father. There is no history of Colon cancer, Stomach cancer, Esophageal cancer, or Pancreatic cancer.  ROS:   Please see the history of present illness.     All other systems reviewed and are negative.  EKGs/Labs/Other Studies Reviewed:    The following studies were reviewed today:  EKG Interpretation Date/Time:  Thursday June 23 2023 10:33:37 EST Ventricular Rate:  68 PR Interval:    QRS Duration:  82 QT Interval:  400 QTC Calculation: 425 R Axis:   -11  Text Interpretation: Atrial fibrillation with premature ventricular or aberrantly conducted complexes Septal infarct ,  age undetermined  When compared with ECG of 12-May-2023 11:05, Questionable change in QRS axis Nonspecific T wave abnormality has replaced inverted T waves in Inferior leads Nonspecific T wave abnormality, worse in Lateral leads Confirmed by Micah Flesher ([id number]) on 06/23/2023 10:35:30 AM    Recent Labs: 05/10/2023: ALT 22; BUN 9; Creatinine 0.5; Hemoglobin 12.3; Platelets 249; Potassium 3.7; Sodium 131  Recent Lipid Panel    Component Value Date/Time   CHOL 79 11/12/2022 0000   TRIG 80 11/12/2022 0000   HDL 33 (A) 11/12/2022 0000   CHOLHDL 2 10/14/2021 1141   VLDL 14.2 10/14/2021 1141   LDLCALC 30 11/12/2022 0000   LDLDIRECT 72.0 09/27/2017 0939     Risk Assessment/Calculations:    CHA2DS2-VASc Score = 7   This indicates a 11.2% annual risk of stroke. The patient's score is based upon: CHF History: 1 HTN History: 1 Diabetes History: 0 Stroke History: 2 Vascular Disease History: 1 Age Score: 2 Gender Score: 0            Physical Exam:    VS:  BP 114/60 (BP Location: Left Arm, Patient Position: Sitting, Cuff Size: Normal)   Pulse 68   Ht 5\' 6"  (1.676 m)   Wt 150 lb 6.4 oz (68.2 kg)   SpO2 97%   BMI 24.28 kg/m     Wt Readings from Last 3 Encounters:  06/23/23 150 lb 6.4 oz (68.2 kg)  06/01/23 160 lb 3.2 oz (72.7 kg)  05/27/23 164 lb 3.2 oz (74.5 kg)     GEN: elderly male in NAD HEENT: Normal NECK: No JVD; No carotid bruits LYMPHATICS: No lymphadenopathy CARDIAC: irregular rhythm, regular rate RESPIRATORY:  rhonchi throughout, productive cough, rhonchi mildly clears with cough  ABDOMEN: Soft, non-tender, non-distended MUSCULOSKELETAL:  No edema; No deformity  SKIN: Warm and dry NEUROLOGIC:  Alert and oriented x 3 PSYCHIATRIC:  Normal affect   ASSESSMENT:    1. Essential hypertension   2. Persistent cough   3. Acute on chronic combined systolic and diastolic congestive heart failure (HCC)   4. Paroxysmal atrial fibrillation (HCC)   5. CAD S/P percutaneous coronary  angioplasty   6. Hyperlipidemia with target LDL less than 70   7. Productive cough   8. PAF (paroxysmal atrial fibrillation) (HCC)   9. Chronic anticoagulation    PLAN:    In order of problems listed above:  CAD s/p STEMI - DES x 2 LAD - NO DAPT give eliquis -- conitnue beta-blocker, statin   Hyperlipidemia with LDL goal less than 70 - Continue statin   Chronic systolic and diastolic heart failure, now with improved EF - reassuring echo - appears euvolemic - should continue 40 mg lasix daily and 20 mEq potassium daily - will collect CMP and BNP today   Productive cough and congestion x 2 months - will order CBC and CXR today  - if PNA, will need to treat - has been on flonase for 2 months without improvement - recommend starting mucinex 1200 mg BID   Hx of PE in the setting of COVID-19 PNA 10/2021 -- completed 6 months of Eliquis therapy - Follow-up CTA negative for PE - initial plan was to discontinue eliquis but this was continued last month in light of newly recognized AFib   PAF - newly recognized 03/2023 - rate controlled today -Concerned that his A-fib may be contributing to CHF exacerbation - He remains rate controlled, and has now been on Eliquis for greater than 3 weeks - We  could entertain amiodarone - but given his 2 months of productive cough, will rule out PNA first before starting amiodarone - he is euvolemic on current regimen, could possibly leave in rate controlled AFib   Follow up in 2 months.          Medication Adjustments/Labs and Tests Ordered: Current medicines are reviewed at length with the patient today.  Concerns regarding medicines are outlined above.  Orders Placed This Encounter  Procedures   DG Chest 2 View   Brain natriuretic peptide   CBC   Comprehensive metabolic panel   EKG 12-Lead   No orders of the defined types were placed in this encounter.   Patient Instructions  Medication Instructions:  No Asprin Continue  Lasix Furosemide 40 mg daily Continue Potassium 20 mg daily Mucinex 1200 twice daily  *If you need a refill on your cardiac medications before your next appointment, please call your pharmacy*   Lab Work: CMP, CBC, BNP completed today.   Testing/Procedures: Complete Chest Xray today At [facility]    Follow-Up: At [facility], you and your health needs are our priority.  As part of our continuing mission to provide you with exceptional heart care, we have created designated Provider Care Teams.  These Care Teams include your primary Cardiologist (physician) and Advanced Practice Providers (APPs -  Physician Assistants and Nurse Practitioners) who all work together to provide you with the care you need, when you need it.  We recommend signing up for the patient portal called "MyChart".  Sign up information is provided on this After Visit Summary.  MyChart is used to connect with patients for Virtual Visits (Telemedicine).  Patients are able to view lab/test results, encounter notes, upcoming appointments, etc.  Non-urgent messages can be sent to your provider as well.   To learn more about what you can do with MyChart, go to [email or web address].    Your next appointment:   2 month(s)  Provider:   Dr. Bjorn Pippin    Other Instructions Proceed to [facility] [address] [place], [place], [place] [address]        Signed, Marcelino Duster, [place]  06/23/2023 11:26 AM    [facility]

## 2023-06-22 DIAGNOSIS — R1312 Dysphagia, oropharyngeal phase: Secondary | ICD-10-CM | POA: Diagnosis not present

## 2023-06-22 DIAGNOSIS — R29898 Other symptoms and signs involving the musculoskeletal system: Secondary | ICD-10-CM | POA: Diagnosis not present

## 2023-06-23 ENCOUNTER — Telehealth: Payer: Self-pay | Admitting: Cardiology

## 2023-06-23 ENCOUNTER — Encounter: Payer: Self-pay | Admitting: Physician Assistant

## 2023-06-23 ENCOUNTER — Ambulatory Visit
Admission: RE | Admit: 2023-06-23 | Discharge: 2023-06-23 | Disposition: A | Discharge status: Home / Self Care | Payer: Medicare Other | Source: Ambulatory Visit | Attending: Physician Assistant

## 2023-06-23 ENCOUNTER — Ambulatory Visit: Payer: Medicare Other | Attending: Physician Assistant | Admitting: Physician Assistant

## 2023-06-23 VITALS — BP 114/60 | HR 68 | Ht 66.0 in | Wt 150.4 lb

## 2023-06-23 DIAGNOSIS — I480 Paroxysmal atrial fibrillation: Secondary | ICD-10-CM | POA: Diagnosis not present

## 2023-06-23 DIAGNOSIS — I5043 Acute on chronic combined systolic (congestive) and diastolic (congestive) heart failure: Secondary | ICD-10-CM | POA: Insufficient documentation

## 2023-06-23 DIAGNOSIS — E785 Hyperlipidemia, unspecified: Secondary | ICD-10-CM | POA: Insufficient documentation

## 2023-06-23 DIAGNOSIS — Z9861 Coronary angioplasty status: Secondary | ICD-10-CM | POA: Insufficient documentation

## 2023-06-23 DIAGNOSIS — I509 Heart failure, unspecified: Secondary | ICD-10-CM | POA: Diagnosis not present

## 2023-06-23 DIAGNOSIS — Z7901 Long term (current) use of anticoagulants: Secondary | ICD-10-CM | POA: Diagnosis not present

## 2023-06-23 DIAGNOSIS — R29898 Other symptoms and signs involving the musculoskeletal system: Secondary | ICD-10-CM | POA: Diagnosis not present

## 2023-06-23 DIAGNOSIS — R058 Other specified cough: Secondary | ICD-10-CM | POA: Diagnosis not present

## 2023-06-23 DIAGNOSIS — I10 Essential (primary) hypertension: Secondary | ICD-10-CM | POA: Insufficient documentation

## 2023-06-23 DIAGNOSIS — I2510 Atherosclerotic heart disease of native coronary artery without angina pectoris: Secondary | ICD-10-CM | POA: Insufficient documentation

## 2023-06-23 DIAGNOSIS — R053 Chronic cough: Secondary | ICD-10-CM | POA: Insufficient documentation

## 2023-06-23 DIAGNOSIS — R1312 Dysphagia, oropharyngeal phase: Secondary | ICD-10-CM | POA: Diagnosis not present

## 2023-06-23 DIAGNOSIS — J90 Pleural effusion, not elsewhere classified: Secondary | ICD-10-CM | POA: Diagnosis not present

## 2023-06-23 NOTE — Patient Instructions (Addendum)
 Medication Instructions:  No Asprin Continue Lasix Furosemide 40 mg daily Continue Potassium 20 mg daily Mucinex 1200 twice daily  *If you need a refill on your cardiac medications before your next appointment, please call your pharmacy*   Lab Work: CMP, CBC, BNP completed today.   Testing/Procedures: Complete Chest Xray today At [facility]    Follow-Up: At [facility], you and your health needs are our priority.  As part of our continuing mission to provide you with exceptional heart care, we have created designated Provider Care Teams.  These Care Teams include your primary Cardiologist (physician) and Advanced Practice Providers (APPs -  Physician Assistants and Nurse Practitioners) who all work together to provide you with the care you need, when you need it.  We recommend signing up for the patient portal called "MyChart".  Sign up information is provided on this After Visit Summary.  MyChart is used to connect with patients for Virtual Visits (Telemedicine).  Patients are able to view lab/test results, encounter notes, upcoming appointments, etc.  Non-urgent messages can be sent to your provider as well.   To learn more about what you can do with MyChart, go to [email or web address].    Your next appointment:   2 month(s)  Provider:   Dr. Bjorn Pippin    Other Instructions Proceed to [facility] [address] [place], [place], [place] [address]

## 2023-06-23 NOTE — Telephone Encounter (Signed)
 Called to say that patient is on furosemide (LASIX) 40 MG tablet . Please advise

## 2023-06-24 ENCOUNTER — Telehealth: Payer: Self-pay | Admitting: Cardiovascular Disease

## 2023-06-24 ENCOUNTER — Telehealth: Payer: Self-pay

## 2023-06-24 DIAGNOSIS — R1312 Dysphagia, oropharyngeal phase: Secondary | ICD-10-CM | POA: Diagnosis not present

## 2023-06-24 DIAGNOSIS — R29898 Other symptoms and signs involving the musculoskeletal system: Secondary | ICD-10-CM | POA: Diagnosis not present

## 2023-06-24 LAB — CBC
Hematocrit: 40.7 % (ref 37.5–51.0)
Hemoglobin: 13.2 g/dL (ref 13.0–17.7)
MCH: 28.7 pg (ref 26.6–33.0)
MCHC: 32.4 g/dL (ref 31.5–35.7)
MCV: 89 fL (ref 79–97)
Platelets: 309 10*3/uL (ref 150–450)
RBC: 4.6 x10E6/uL (ref 4.14–5.80)
RDW: 13.9 % (ref 11.6–15.4)
WBC: 11 10*3/uL — ABNORMAL HIGH (ref 3.4–10.8)

## 2023-06-24 LAB — COMPREHENSIVE METABOLIC PANEL
ALT: 32 [IU]/L (ref 0–44)
AST: 37 [IU]/L (ref 0–40)
Albumin: 3.1 g/dL — ABNORMAL LOW (ref 3.6–4.6)
Alkaline Phosphatase: 171 [IU]/L — ABNORMAL HIGH (ref 44–121)
BUN/Creatinine Ratio: 27 — ABNORMAL HIGH (ref 10–24)
BUN: 15 mg/dL (ref 10–36)
Bilirubin Total: 1 mg/dL (ref 0.0–1.2)
CO2: 20 mmol/L (ref 20–29)
Calcium: 8.6 mg/dL (ref 8.6–10.2)
Chloride: 102 mmol/L (ref 96–106)
Creatinine, Ser: 0.56 mg/dL — ABNORMAL LOW (ref 0.76–1.27)
Globulin, Total: 2.9 g/dL (ref 1.5–4.5)
Glucose: 107 mg/dL — ABNORMAL HIGH (ref 70–99)
Potassium: 3.6 mmol/L (ref 3.5–5.2)
Sodium: 140 mmol/L (ref 134–144)
Total Protein: 6 g/dL (ref 6.0–8.5)
eGFR: 90 mL/min/{1.73_m2} (ref >59)

## 2023-06-24 LAB — BRAIN NATRIURETIC PEPTIDE: BNP: 231.7 pg/mL — ABNORMAL HIGH (ref 0.0–100.0)

## 2023-06-24 NOTE — Telephone Encounter (Signed)
 Marcelino Duster, PA Regarding result: DG Chest 2 View     06/23/23  5:24 PM Result Note Your chest x-ray is more consistent with heart failure than pneumonia. Friend's home also confirmed you are taking 40 mg lasix. Please increase lasix to 60 mg x 5 days then resume 40 mg lasix daily. Please fax this CXR report and order to [facility].  Patient identification verified by 2 forms. Marilynn Rail, RN    Called and spoke to patient daughter Lendon Colonel Chest xray results  Lynden Ang requesting recent lab results  Informed cathy message sent to provider for input on recent lab results  Lynden Ang verbalized understanding, no questions at this time

## 2023-06-24 NOTE — Telephone Encounter (Signed)
 Is asking that the info be refax. They have received the fax yet. Please advise

## 2023-06-24 NOTE — Telephone Encounter (Signed)
 Spoke with Gabriel [name] several times after fax wouldn't go through. Per Gabriel [name], she would have someone look at the fax machine. Faxed orders over for the 4th times and it was successful!

## 2023-06-24 NOTE — Telephone Encounter (Signed)
 Daughter Lynden Ang) called to follow-up on patient test results.

## 2023-06-24 NOTE — Telephone Encounter (Signed)
 Lmom to discuss pts DG Chest 2 view results. Waiting on a return call.   Spoke with Zella Ball at [facility]. Faxed new ordered and results to [phone].

## 2023-06-27 DIAGNOSIS — R29898 Other symptoms and signs involving the musculoskeletal system: Secondary | ICD-10-CM | POA: Diagnosis not present

## 2023-06-27 DIAGNOSIS — R1312 Dysphagia, oropharyngeal phase: Secondary | ICD-10-CM | POA: Diagnosis not present

## 2023-06-28 DIAGNOSIS — R1312 Dysphagia, oropharyngeal phase: Secondary | ICD-10-CM | POA: Diagnosis not present

## 2023-06-28 DIAGNOSIS — R29898 Other symptoms and signs involving the musculoskeletal system: Secondary | ICD-10-CM | POA: Diagnosis not present

## 2023-06-29 ENCOUNTER — Encounter: Payer: Self-pay | Admitting: Nurse Practitioner

## 2023-06-29 ENCOUNTER — Telehealth: Payer: Self-pay | Admitting: Physician Assistant

## 2023-06-29 ENCOUNTER — Non-Acute Institutional Stay (SKILLED_NURSING_FACILITY): Payer: Self-pay | Admitting: Nurse Practitioner

## 2023-06-29 DIAGNOSIS — E871 Hypo-osmolality and hyponatremia: Secondary | ICD-10-CM | POA: Diagnosis not present

## 2023-06-29 DIAGNOSIS — Z86711 Personal history of pulmonary embolism: Secondary | ICD-10-CM | POA: Diagnosis not present

## 2023-06-29 DIAGNOSIS — I480 Paroxysmal atrial fibrillation: Secondary | ICD-10-CM | POA: Diagnosis not present

## 2023-06-29 DIAGNOSIS — E785 Hyperlipidemia, unspecified: Secondary | ICD-10-CM

## 2023-06-29 DIAGNOSIS — I5189 Other ill-defined heart diseases: Secondary | ICD-10-CM | POA: Diagnosis not present

## 2023-06-29 DIAGNOSIS — F419 Anxiety disorder, unspecified: Secondary | ICD-10-CM | POA: Diagnosis not present

## 2023-06-29 DIAGNOSIS — K219 Gastro-esophageal reflux disease without esophagitis: Secondary | ICD-10-CM | POA: Diagnosis not present

## 2023-06-29 DIAGNOSIS — I2510 Atherosclerotic heart disease of native coronary artery without angina pectoris: Secondary | ICD-10-CM

## 2023-06-29 DIAGNOSIS — Z9861 Coronary angioplasty status: Secondary | ICD-10-CM | POA: Diagnosis not present

## 2023-06-29 DIAGNOSIS — I10 Essential (primary) hypertension: Secondary | ICD-10-CM

## 2023-06-29 DIAGNOSIS — R1312 Dysphagia, oropharyngeal phase: Secondary | ICD-10-CM | POA: Diagnosis not present

## 2023-06-29 DIAGNOSIS — R29898 Other symptoms and signs involving the musculoskeletal system: Secondary | ICD-10-CM | POA: Diagnosis not present

## 2023-06-29 NOTE — Assessment & Plan Note (Signed)
 resumed Eliquis and dc'd ASA/Plavix 03/2023 per cardiology, on  Metoprolol

## 2023-06-29 NOTE — Assessment & Plan Note (Signed)
 on Atorvastatin, LDL 30 11/12/22

## 2023-06-29 NOTE — Assessment & Plan Note (Signed)
 on Gabapentin, power w/c for mobility

## 2023-06-29 NOTE — Assessment & Plan Note (Addendum)
 PE 11/02/21, had CTA 05/12/22, 09/08/22 Cardiology dc Eliquis, then resumed 03/2023 and off ASA and Plavix 03/2023 in setting of Afib.  Repeated CT @ 6 mos,  negative PE

## 2023-06-29 NOTE — Assessment & Plan Note (Signed)
 thing liquid, hx of dilatation,  takes Pantoprazole, Hgb 13.2 06/23/23

## 2023-06-29 NOTE — Assessment & Plan Note (Signed)
 Na 140 06/23/23

## 2023-06-29 NOTE — Assessment & Plan Note (Signed)
 failed Effexor. Emotional and behavioral outbursts: yelling, resisting, threatening, and attempted hitting at staff when assistance or redirection offered.  Will try Lexapro 5mg  every day, f/u BMP one week.

## 2023-06-29 NOTE — Telephone Encounter (Signed)
 Pt daughter is calling in for lab and xray results. She also wanted to emphasis that these results were also sent to [facility] because she states she spoke to them and they did not receive. Please advise.

## 2023-06-29 NOTE — Assessment & Plan Note (Signed)
ST elevation MI LAD 05/2021, underwent Cath, stenting, s/p percutaneous coronary angioplasty, started DAPT, Echo EF 40-45%,  65-70% 11/02/21, started Plavix 12/24/21 per [facility] 

## 2023-06-29 NOTE — Assessment & Plan Note (Signed)
 Grade 2 diastolic dysfunction, swelling BLE, SOB, placed on Furosemide in setting of weight gained, improved, followed by Cardiology, BNP 231/7 06/23/23

## 2023-06-29 NOTE — Progress Notes (Unsigned)
 Location:   [facility] Room Number: 46 Place of Service:  SNF (31) Provider: [facility] Shanece Cochrane NP  Nelwyn Salisbury, MD  Patient Care Team: Nelwyn Salisbury, MD as PCP - General (Family Medicine) Little Ishikawa, MD as PCP - Cardiology (Cardiology) Ernesto Rutherford, MD as Consulting Physician (Ophthalmology) Luciana Axe Alford Highland, MD as Consulting Physician (Ophthalmology) Duke, Roe Rutherford, PA as Physician Assistant (Cardiology)  Extended Emergency Contact Information Primary Emergency Contact: Elspeth Cho [place] Home Phone: [phone] Mobile Phone: [phone] Relation: Daughter Secondary Emergency Contact: Doree Albee [place] Home Phone: [phone] Relation: Daughter  Code Status: DNR Goals of care: Advanced Directive information    05/19/2023   10:23 AM  Advanced Directives  Does Patient Have a Medical Advance Directive? Yes  Type of Estate agent of [place];Living will  Does patient want to make changes to medical advance directive? No - Patient declined  Copy of Healthcare Power of Attorney in Chart? Yes - validated most recent copy scanned in chart (See row information)     Chief Complaint  Patient presents with  . Acute Visit    anxiety    HPI:  Pt is a 89 y.o. male seen today for an acute visit for anxiety, failed Effexor. Emotional and behavioral outbursts: yelling, resisting, threatening, and attempted hitting at staff when assistance or redirection offered.     Chronic dermatitis buttocks, followed by dermatology, presently: Nystatin powder, Fluocinonide Dysphagia, choked in the past, chopped meat with thin fluid presently. Underwent GI evaluation. CXR normal 03/07/23             Anxiety, failed effexor, stated sleeps well at night.              The right arm pain, more at night, Tylenol at night helped                03/24/23 right elbow fx(distal end of right humerus), mild swelling  fingers, pain is well not controlled, in hinged elbow ROM brace during day, therapy is going to try different brace at night, has been to surgeon-non surgical candidate. The patient desires delaying X-ray.              CAD ST elevation MI LAD 05/2021, underwent Cath, stenting, s/p percutaneous coronary angioplasty, started DAPT, Echo EF 40-45%,  65-70% 11/02/21, started Plavix 12/24/21 per [facility]             PE 11/02/21, had CTA 05/12/22, 09/08/22 Cardiology dc Eliquis, then resumed 03/2023 and off ASA and Plavix 03/2023 in setting of Afib.  Repeated CT @ 6 mos,  negative PE             Grade 2 diastolic dysfunction, swelling BLE, SOB, placed on Furosemide in setting of weight gained, improved, followed by Cardiology, BNP 231/7 06/23/23             Afib, resumed Eliquis and dc'd ASA/Plavix 03/2023 per cardiology, on  Metoprolol             GERD, thing liquid, hx of dilatation,  takes Pantoprazole, Hgb 13.2 06/23/23             Hypokalemia, on Kcl K 3.6 06/23/23             Hyponatremia, Na 140 06/23/23             HLD on Atorvastatin, LDL 30 11/12/22  HTN, on Metoprolol             Peripheral neuropathy, on Gabapentin   Past Medical History:  Diagnosis Date  . Arthritis   . Diverticulosis of colon   . External thrombosed hemorrhoids 06/11/2009   Qualifier: Diagnosis of  By: Clent Ridges MD, Tera Mater   . GERD (gastroesophageal reflux disease)   . Hypertension    Past Surgical History:  Procedure Laterality Date  . cataract extaction, right  6/09   per Dr. Kennedy Bucker  . CATARACT EXTRACTION Left   . COLONOSCOPY  09/23/04   per Dr. Sherin Quarry, clear, no repeats needed   . CORONARY STENT INTERVENTION N/A 06/06/2021   Procedure: CORONARY STENT INTERVENTION;  Surgeon: Yvonne Kendall, MD;  Location: [facility];  Service: Cardiovascular;  Laterality: N/A;  . CORONARY/GRAFT ACUTE MI REVASCULARIZATION N/A 06/06/2021   Procedure: Coronary/Graft Acute MI Revascularization;  Surgeon: Yvonne Kendall,  MD;  Location: [facility];  Service: Cardiovascular;  Laterality: N/A;  . EGD and esophageal diatation,  05-10-11   per Dr. Christella Hartigan    . HERNIA REPAIR    . LEFT HEART CATH AND CORONARY ANGIOGRAPHY N/A 06/06/2021   Procedure: LEFT HEART CATH AND CORONARY ANGIOGRAPHY;  Surgeon: Yvonne Kendall, MD;  Location: [facility];  Service: Cardiovascular;  Laterality: N/A;  . rt inguinal hernia     x2    Allergies  Allergen Reactions  . Effexor [Venlafaxine]     Pt became lethargic, drowsy and not his usual self with effexor.  . Contrast Media [Iodinated Contrast Media] Rash    Allergies as of 06/29/2023       Reactions   Effexor [venlafaxine]    Pt became lethargic, drowsy and not his usual self with effexor.   Contrast Media [iodinated Contrast Media] Rash        Medication List        Accurate as of June 29, 2023  3:38 PM. If you have any questions, ask your nurse or doctor.          acetaminophen 325 MG tablet Commonly known as: TYLENOL Take 650 mg by mouth every 6 (six) hours as needed.   acetaminophen 500 MG tablet Commonly known as: TYLENOL Take 500 mg by mouth 2 (two) times daily.   apixaban 5 MG Tabs tablet Commonly known as: ELIQUIS Take 1 tablet (5 mg total) by mouth 2 (two) times daily.   atorvastatin 40 MG tablet Commonly known as: LIPITOR Take 1 tablet (40 mg total) by mouth daily.   Biofreeze Roll-On 4 % Gel Generic drug: Menthol (Topical Analgesic) Apply topically. Apply to neck topically two times a day   CeraVe Itch Relief 1 % Lotn Generic drug: pramoxine Apply 1 Application topically 2 (two) times daily.   clotrimazole-betamethasone cream Commonly known as: LOTRISONE Apply 1 Application topically 2 (two) times daily as needed. Fax#[phone]   docusate sodium 100 MG capsule Commonly known as: COLACE Take 100 mg by mouth 2 (two) times daily.   feeding supplement Liqd Take 1 Container by mouth 2 (two) times daily between  meals.   fexofenadine 180 MG tablet Commonly known as: ALLEGRA Take 180 mg by mouth daily.   fluocinonide cream 0.05 % Commonly known as: LIDEX Apply 1 Application topically 2 (two) times daily.   fluticasone 50 MCG/ACT nasal spray Commonly known as: FLONASE Place 2 sprays into both nostrils daily.   furosemide 40 MG tablet Commonly known as: LASIX Take 1.5 pills daily for  3 days then resume 40 mg daily.   gabapentin 300 MG capsule Commonly known as: NEURONTIN TAKE 1 CAPSULE BY MOUTH THREE TIMES A DAY   metoprolol succinate 25 MG 24 hr tablet Commonly known as: Toprol XL Take 0.5 tablets (12.5 mg total) by mouth in the morning and at bedtime.   mometasone 0.1 % cream Commonly known as: ELOCON Apply 1 Application topically daily.   nitroGLYCERIN 0.4 MG SL tablet Commonly known as: Nitrostat Place 1 tablet (0.4 mg total) under the tongue every 5 (five) minutes as needed for chest pain.   nystatin powder Commonly known as: MYCOSTATIN/NYSTOP Apply 1 Application topically 3 (three) times daily.   pantoprazole 40 MG tablet Commonly known as: PROTONIX Take 1 tablet (40 mg total) by mouth daily.   polyvinyl alcohol 1.4 % ophthalmic solution Commonly known as: LIQUIFILM TEARS Place 2 drops into the right eye 2 (two) times daily.   potassium chloride SA 20 MEQ tablet Commonly known as: KLOR-CON M Take 1 tablet (20 mEq total) by mouth daily.   zinc oxide 20 % ointment Apply 1 Application topically daily at 12 noon. Apply to buttocks after applying Fluocinonide Cream        Review of Systems  Constitutional:  Negative for appetite change, fatigue and fever.  HENT:  Positive for hearing loss and trouble swallowing. Negative for congestion, postnasal drip and rhinorrhea.   Eyes:  Negative for visual disturbance.  Respiratory:  Negative for cough, shortness of breath and wheezing.   Cardiovascular:  Positive for leg swelling.  Gastrointestinal:  Negative for abdominal  pain and constipation.  Genitourinary:  Negative for dysuria, frequency and urgency.       Incontinent of urine  Musculoskeletal:  Positive for arthralgias and gait problem. Negative for joint swelling.       R arm pain.   Skin:  Positive for rash.  Neurological:  Negative for speech difficulty, weakness and headaches.  Psychiatric/Behavioral:  Negative for confusion and sleep disturbance. The patient is not nervous/anxious.     Immunization History  Administered Date(s) Administered  . Fluad Quad(high Dose 65+) 02/15/2022, 02/08/2023  . Influenza Split 01/20/2012, 01/07/2013  . Influenza Whole 02/09/2006, 01/11/2008  . Influenza, High Dose Seasonal PF 12/30/2016, 12/27/2017, 01/08/2019  . Influenza,inj,quad, With Preservative 12/29/2018  . Influenza-Unspecified 01/17/2014, 01/14/2016, 12/30/2016  . Moderna Covid-19 Fall Seasonal Vaccine 43yrs & older 03/22/2022, 02/09/2023  . PFIZER Comirnaty(Gray Top)Covid-19 Tri-Sucrose Vaccine 05/28/2019, 03/04/2020, 09/23/2020  . Research officer, [employer] 56yrs & up 02/25/2022  . Pneumococcal Conjugate-13 09/23/2015  . Pneumococcal Polysaccharide-23 03/13/2008  . Td 12/20/2007  . Tdap 07/06/2021  . Unspecified SARS-COV-2 Vaccination 04/30/2019  . Zoster Recombinant(Shingrix) 03/11/2020, 06/20/2020   Pertinent  Health Maintenance Due  Topic Date Due  . INFLUENZA VACCINE  Completed  . FOOT EXAM  Discontinued  . HEMOGLOBIN A1C  Discontinued  . OPHTHALMOLOGY EXAM  Discontinued      05/04/2022    2:44 PM 06/01/2022   11:04 AM 06/15/2022    9:14 AM 06/23/2022    3:12 PM 10/19/2022   11:19 AM  Fall Risk  Falls in the past year? 1 1 1 1  Exclusion - non ambulatory  Was there an injury with Fall? 1 1 1 1    Was there an injury with Fall? - Comments    Fx Rt Ekbow. Followed by Orthopedic   Fall Risk Category Calculator 3 3 3 2    Fall Risk Category (Retired) High      (RETIRED) Patient  Fall Risk Level High fall risk      Patient at  Risk for Falls Due to History of fall(s);Impaired balance/gait;Impaired mobility;Impaired vision History of fall(s);Impaired balance/gait;Impaired mobility;Impaired vision History of fall(s);Impaired balance/gait;Impaired mobility Orthopedic patient   Fall risk Follow up Falls evaluation completed Falls evaluation completed Falls evaluation completed Falls prevention discussed Falls evaluation completed   Functional Status Survey:    Vitals:   06/29/23 1519  BP: (!) 144/76  Pulse: 65  Resp: 18  Temp: (!) 97.4 F (36.3 C)  SpO2: 97%  Weight: 149 lb 6.4 oz (67.8 kg)   Body mass index is 24.11 kg/m. Physical Exam Vitals and nursing note reviewed.  Constitutional:      Appearance: Normal appearance.  HENT:     Head: Normocephalic.     Comments:      Nose: Nose normal. No congestion.     Mouth/Throat:     Mouth: Mucous membranes are moist.  Eyes:     Extraocular Movements: Extraocular movements intact.     Conjunctiva/sclera: Conjunctivae normal.     Pupils: Pupils are equal, round, and reactive to light.  Cardiovascular:     Rate and Rhythm: Normal rate. Rhythm irregular.     Heart sounds: No murmur heard. Pulmonary:     Effort: Pulmonary effort is normal.     Breath sounds: Rales present.     Comments: Bibasilar rales. Central congestion Abdominal:     General: Bowel sounds are normal.     Palpations: Abdomen is soft.     Tenderness: There is no abdominal tenderness.  Musculoskeletal:        General: No tenderness.     Cervical back: Normal range of motion and neck supple.     Right lower leg: Edema present.     Left lower leg: Edema present.     Comments: Right elbow in a hinged ROM brace, able to make a fist, pain at night, chronic neck pain-positional.  Swelling BLE minimal  Skin:    General: Skin is warm and dry.     Findings: Erythema and rash present.     Comments: Chronic perineal/buttocks skin irritation from moist and heat: dark reddened buttocks    Neurological:     General: No focal deficit present.     Mental Status: He is alert and oriented to person, place, and time. Mental status is at baseline.     Gait: Gait abnormal.     Comments: W/c for mobility.   Psychiatric:        Mood and Affect: Mood normal.        Behavior: Behavior normal.        Thought Content: Thought content normal.    Labs reviewed: Recent Labs    03/15/23 0000 05/10/23 0000 06/23/23 1142  NA 134* 131* 140  K 4.0 3.7 3.6  CL 100 98* 102  CO2 26* 24* 20  GLUCOSE  --   --  107*  BUN 10 9 15   CREATININE 0.4* 0.5* 0.56*  CALCIUM 8.6* 8.6* 8.6   Recent Labs    02/15/23 0000 05/10/23 0000 06/23/23 1142  AST 25 25 37  ALT 20 22 32  ALKPHOS 134* 129* 171*  BILITOT  --   --  1.0  PROT  --   --  6.0  ALBUMIN 3.5 3.3* 3.1*   Recent Labs    03/08/23 0000 05/10/23 0000 06/23/23 1142  WBC 8.1 9.0 11.0*  NEUTROABS  --  7,344.00  --  HGB 13.5 12.3* 13.2  HCT 40* 37* 40.7  MCV  --   --  89  PLT 300 249 309   Lab Results  Component Value Date   TSH 2.039 11/02/2021   Lab Results  Component Value Date   HGBA1C 5.6 11/12/2022   Lab Results  Component Value Date   CHOL 79 11/12/2022   HDL 33 (A) 11/12/2022   LDLCALC 30 11/12/2022   LDLDIRECT 72.0 09/27/2017   TRIG 80 11/12/2022   CHOLHDL 2 10/14/2021    Significant Diagnostic Results in last 30 days:  DG Chest 2 View Result Date: 06/23/2023 CLINICAL DATA:  Cough for 2 months, hypertension EXAM: CHEST - 2 VIEW COMPARISON:  03/23/2022, 05/12/2022 FINDINGS: Frontal and lateral views of the chest demonstrate an enlarged cardiac silhouette. Bibasilar veiling opacities consistent with lower lobe consolidation and small bilateral pleural effusions. Patchy perihilar airspace disease is seen in the perihilar regions, right greater than left. No pneumothorax. No acute bony abnormalities. IMPRESSION: 1. Findings compatible with congestive heart failure and bilateral pleural effusions.  Electronically Signed   By: Sharlet Salina M.D.   On: 06/23/2023 15:53    Assessment/Plan: Anxiety  failed Effexor. Emotional and behavioral outbursts: yelling, resisting, threatening, and attempted hitting at staff when assistance or redirection offered.  Will try Lexapro 5mg  every day, f/u BMP one week.   CAD S/P percutaneous coronary angioplasty ST elevation MI LAD 05/2021, underwent Cath, stenting, s/p percutaneous coronary angioplasty, started DAPT, Echo EF 40-45%,  65-70% 11/02/21, started Plavix 12/24/21 per [facility]  History of pulmonary embolism PE 11/02/21, had CTA 05/12/22, 09/08/22 Cardiology dc Eliquis, then resumed 03/2023 and off ASA and Plavix 03/2023 in setting of Afib.  Repeated CT @ 6 mos,  negative PE  Diastolic dysfunction Grade 2 diastolic dysfunction, swelling BLE, SOB, placed on Furosemide in setting of weight gained, improved, followed by Cardiology, BNP 231/7 06/23/23  Paroxysmal atrial fibrillation (HCC) resumed Eliquis and dc'd ASA/Plavix 03/2023 per cardiology, on  Metoprolol  GERD thing liquid, hx of dilatation,  takes Pantoprazole, Hgb 13.2 06/23/23  Hyponatremia Na 140 06/23/23  Hyperlipidemia  on Atorvastatin, LDL 30 11/12/22  Essential hypertension Blood pressure is controlled, continue Metoprolol.     Family/ staff Communication: plan of care reviewed with the patient and charge nurse.   Labs/tests ordered:  BMP one week.

## 2023-06-29 NOTE — Telephone Encounter (Signed)
 Gabriel [name], CMA 06/28/2023  1:09 PM EST Back to Top    Orders faxed.   Gabriel [name], [place] 06/27/2023 11:57 AM EST     His labs show only minor fluid. His kidney function and electrolytes look fine. His WBC is mildly elevated, but CXR reassuring/did not show infection. One of his liver function tests were elevated, appears to have been elevated previously. I would like for his PCP to take a look. For now, I think he should discontinue his lipitor.   He should continue 40 mg lasix daily along with 20 mEq potassium daily after 5 day course of 60 mg lasix suggested last week after his CXR resulted.   Gabriel [name]- can you please fax order to discontinue lipitor and copy of labs to his facility?    Patient identification verified by 2 forms. Gabriel Rail, RN    Called and spoke to patients daughter  Relayed provider result message  Gabriel [name] verbalized understanding, no questions at this time

## 2023-06-29 NOTE — Assessment & Plan Note (Signed)
 Sbp in 140s, loose bp control,  continue Metoprolol.

## 2023-07-01 DIAGNOSIS — R29898 Other symptoms and signs involving the musculoskeletal system: Secondary | ICD-10-CM | POA: Diagnosis not present

## 2023-07-01 DIAGNOSIS — R1312 Dysphagia, oropharyngeal phase: Secondary | ICD-10-CM | POA: Diagnosis not present

## 2023-07-04 DIAGNOSIS — R1312 Dysphagia, oropharyngeal phase: Secondary | ICD-10-CM | POA: Diagnosis not present

## 2023-07-04 DIAGNOSIS — R29898 Other symptoms and signs involving the musculoskeletal system: Secondary | ICD-10-CM | POA: Diagnosis not present

## 2023-07-05 DIAGNOSIS — R1312 Dysphagia, oropharyngeal phase: Secondary | ICD-10-CM | POA: Diagnosis not present

## 2023-07-05 DIAGNOSIS — R29898 Other symptoms and signs involving the musculoskeletal system: Secondary | ICD-10-CM | POA: Diagnosis not present

## 2023-07-06 DIAGNOSIS — R1312 Dysphagia, oropharyngeal phase: Secondary | ICD-10-CM | POA: Diagnosis not present

## 2023-07-06 DIAGNOSIS — R29898 Other symptoms and signs involving the musculoskeletal system: Secondary | ICD-10-CM | POA: Diagnosis not present

## 2023-07-07 DIAGNOSIS — R1312 Dysphagia, oropharyngeal phase: Secondary | ICD-10-CM | POA: Diagnosis not present

## 2023-07-07 DIAGNOSIS — R29898 Other symptoms and signs involving the musculoskeletal system: Secondary | ICD-10-CM | POA: Diagnosis not present

## 2023-07-07 DIAGNOSIS — E876 Hypokalemia: Secondary | ICD-10-CM | POA: Diagnosis not present

## 2023-07-07 DIAGNOSIS — I10 Essential (primary) hypertension: Secondary | ICD-10-CM | POA: Diagnosis not present

## 2023-07-07 LAB — BASIC METABOLIC PANEL WITH GFR
BUN: 22 — AB (ref 4–21)
CO2: 27 — AB (ref 13–22)
Chloride: 104 (ref 99–108)
Creatinine: 0.5 — AB (ref 0.6–1.3)
Glucose: 102
Potassium: 3.9 meq/L (ref 3.5–5.1)
Sodium: 140 (ref 137–147)

## 2023-07-07 LAB — COMPREHENSIVE METABOLIC PANEL WITH GFR: Calcium: 8.9 (ref 8.7–10.7)

## 2023-07-08 DIAGNOSIS — R29898 Other symptoms and signs involving the musculoskeletal system: Secondary | ICD-10-CM | POA: Diagnosis not present

## 2023-07-08 DIAGNOSIS — R1312 Dysphagia, oropharyngeal phase: Secondary | ICD-10-CM | POA: Diagnosis not present

## 2023-07-11 DIAGNOSIS — R1312 Dysphagia, oropharyngeal phase: Secondary | ICD-10-CM | POA: Diagnosis not present

## 2023-07-11 DIAGNOSIS — R29898 Other symptoms and signs involving the musculoskeletal system: Secondary | ICD-10-CM | POA: Diagnosis not present

## 2023-07-12 DIAGNOSIS — R29898 Other symptoms and signs involving the musculoskeletal system: Secondary | ICD-10-CM | POA: Diagnosis not present

## 2023-07-12 DIAGNOSIS — R1312 Dysphagia, oropharyngeal phase: Secondary | ICD-10-CM | POA: Diagnosis not present

## 2023-07-13 DIAGNOSIS — R1312 Dysphagia, oropharyngeal phase: Secondary | ICD-10-CM | POA: Diagnosis not present

## 2023-07-13 DIAGNOSIS — R29898 Other symptoms and signs involving the musculoskeletal system: Secondary | ICD-10-CM | POA: Diagnosis not present

## 2023-07-14 DIAGNOSIS — R1312 Dysphagia, oropharyngeal phase: Secondary | ICD-10-CM | POA: Diagnosis not present

## 2023-07-14 DIAGNOSIS — R29898 Other symptoms and signs involving the musculoskeletal system: Secondary | ICD-10-CM | POA: Diagnosis not present

## 2023-07-15 DIAGNOSIS — R29898 Other symptoms and signs involving the musculoskeletal system: Secondary | ICD-10-CM | POA: Diagnosis not present

## 2023-07-15 DIAGNOSIS — R1312 Dysphagia, oropharyngeal phase: Secondary | ICD-10-CM | POA: Diagnosis not present

## 2023-07-18 DIAGNOSIS — R29898 Other symptoms and signs involving the musculoskeletal system: Secondary | ICD-10-CM | POA: Diagnosis not present

## 2023-07-18 DIAGNOSIS — R1312 Dysphagia, oropharyngeal phase: Secondary | ICD-10-CM | POA: Diagnosis not present

## 2023-07-19 DIAGNOSIS — R1312 Dysphagia, oropharyngeal phase: Secondary | ICD-10-CM | POA: Diagnosis not present

## 2023-07-19 DIAGNOSIS — R29898 Other symptoms and signs involving the musculoskeletal system: Secondary | ICD-10-CM | POA: Diagnosis not present

## 2023-07-20 DIAGNOSIS — R29898 Other symptoms and signs involving the musculoskeletal system: Secondary | ICD-10-CM | POA: Diagnosis not present

## 2023-07-20 DIAGNOSIS — R1312 Dysphagia, oropharyngeal phase: Secondary | ICD-10-CM | POA: Diagnosis not present

## 2023-07-22 DIAGNOSIS — R29898 Other symptoms and signs involving the musculoskeletal system: Secondary | ICD-10-CM | POA: Diagnosis not present

## 2023-07-22 DIAGNOSIS — R1312 Dysphagia, oropharyngeal phase: Secondary | ICD-10-CM | POA: Diagnosis not present

## 2023-07-25 ENCOUNTER — Non-Acute Institutional Stay (SKILLED_NURSING_FACILITY): Admitting: Sports Medicine

## 2023-07-25 ENCOUNTER — Encounter: Payer: Self-pay | Admitting: Sports Medicine

## 2023-07-25 DIAGNOSIS — K219 Gastro-esophageal reflux disease without esophagitis: Secondary | ICD-10-CM

## 2023-07-25 DIAGNOSIS — R1312 Dysphagia, oropharyngeal phase: Secondary | ICD-10-CM | POA: Diagnosis not present

## 2023-07-25 DIAGNOSIS — L309 Dermatitis, unspecified: Secondary | ICD-10-CM | POA: Diagnosis not present

## 2023-07-25 DIAGNOSIS — I5189 Other ill-defined heart diseases: Secondary | ICD-10-CM

## 2023-07-25 DIAGNOSIS — G609 Hereditary and idiopathic neuropathy, unspecified: Secondary | ICD-10-CM | POA: Diagnosis not present

## 2023-07-25 DIAGNOSIS — I10 Essential (primary) hypertension: Secondary | ICD-10-CM

## 2023-07-25 DIAGNOSIS — F411 Generalized anxiety disorder: Secondary | ICD-10-CM

## 2023-07-25 DIAGNOSIS — I4891 Unspecified atrial fibrillation: Secondary | ICD-10-CM | POA: Diagnosis not present

## 2023-07-25 DIAGNOSIS — R29898 Other symptoms and signs involving the musculoskeletal system: Secondary | ICD-10-CM | POA: Diagnosis not present

## 2023-07-25 NOTE — Progress Notes (Signed)
 Provider:  Dr. Venita Sheffield Location:  [facility] Place of Service:   skilled care  PCP: Nelwyn Salisbury, MD Patient Care Team: Nelwyn Salisbury, MD as PCP - General (Family Medicine) Little Ishikawa, MD as PCP - Cardiology (Cardiology) Ernesto Rutherford, MD as Consulting Physician (Ophthalmology) Luciana Axe Alford Highland, MD as Consulting Physician (Ophthalmology) Duke, Roe Rutherford, PA as Physician Assistant (Cardiology)  Extended Emergency Contact Information Primary Emergency Contact: Elspeth Cho [place] Home Phone: [phone] Mobile Phone: [phone] Relation: Daughter Secondary Emergency Contact: Doree Albee [place] Home Phone: [phone] Relation: Daughter  Goals of Care: Advanced Directive information    05/19/2023   10:23 AM  Advanced Directives  Does Patient Have a Medical Advance Directive? Yes  Type of Estate agent of [place];Living will  Does patient want to make changes to medical advance directive? No - Patient declined  Copy of Healthcare Power of Attorney in Chart? Yes - validated most recent copy scanned in chart (See row information)        History of Present Illness         89 year old male with a past medical history of chronic dermatitis, dysphagia, anxiety, osteoarthritis, A-fib, GERD, hyperlipidemia, hypertension, peripheral neuropathy is evaluated for the chronic disease management Patient seen and examined in his room.  He is sitting in his recliner chair.  Seems pleasant and comfortable and does not appear to be in distress. He is watching TV and is in a pleasant mood and talks about baseball and shows me the pictures in his room. Patient has chronic pain in his neck.  States it is sore and is currently working with physical therapy. As per staff his mood is better lately.  In the past he has behavioral outbursts with yelling and threatening and attempting hitting at  staff.  He is currently on Lexapro 5 mg. Chronic dermatitis on his buttock-followed with dermatology.     Past Medical History:  Diagnosis Date   Arthritis    Diverticulosis of colon    External thrombosed hemorrhoids 06/11/2009   Qualifier: Diagnosis of  By: Clent Ridges MD, Tera Mater    GERD (gastroesophageal reflux disease)    Hypertension    Past Surgical History:  Procedure Laterality Date   cataract extaction, right  6/09   per Dr. Kennedy Bucker   CATARACT EXTRACTION Left    COLONOSCOPY  09/23/04   per Dr. Sherin Quarry, clear, no repeats needed    CORONARY STENT INTERVENTION N/A 06/06/2021   Procedure: CORONARY STENT INTERVENTION;  Surgeon: Yvonne Kendall, MD;  Location: [facility];  Service: Cardiovascular;  Laterality: N/A;   CORONARY/GRAFT ACUTE MI REVASCULARIZATION N/A 06/06/2021   Procedure: Coronary/Graft Acute MI Revascularization;  Surgeon: Yvonne Kendall, MD;  Location: [facility];  Service: Cardiovascular;  Laterality: N/A;   EGD and esophageal diatation,  05-10-11   per Dr. Christella Hartigan     HERNIA REPAIR     LEFT HEART CATH AND CORONARY ANGIOGRAPHY N/A 06/06/2021   Procedure: LEFT HEART CATH AND CORONARY ANGIOGRAPHY;  Surgeon: Yvonne Kendall, MD;  Location: [facility];  Service: Cardiovascular;  Laterality: N/A;   rt inguinal hernia     x2    reports that he has quit smoking. His smoking use included cigarettes. He has a 7.5 pack-year smoking history. He has never used smokeless tobacco. He reports that he does not drink alcohol and does not use drugs. Social History   Socioeconomic History  Marital status: Widowed    Spouse name: Not on file   Number of children: 3   Years of education: Not on file   Highest education level: Not on file  Occupational History   Occupation: retired  Tobacco Use   Smoking status: Former    Current packs/day: 15.00    Average packs/day: 15.0 packs/day for 0.5 years (7.5 ttl pk-yrs)    Types: Cigarettes   Smokeless  tobacco: Never   Tobacco comments:    smoked 18 and stopped and states he stopped 30's   Vaping Use   Vaping status: Never Used  Substance and Sexual Activity   Alcohol use: No    Alcohol/week: 0.0 standard drinks of alcohol   Drug use: No   Sexual activity: Never    Birth control/protection: Abstinence  Other Topics Concern   Not on file  Social History Narrative   Lives in studio at [facility]   Widowed since 2009, was married x 57 years.    3 daughters.    Social Drivers of Corporate investment banker Strain: Low Risk  (06/23/2022)   Overall Financial Resource Strain (CARDIA)    Difficulty of Paying Living Expenses: Not hard at all  Food Insecurity: Low Risk  (06/06/2023)   Received from [facility]   Hunger Vital Sign    Worried About Running Out of Food in the Last Year: Never true    Ran Out of Food in the Last Year: Never true  Transportation Needs: No Transportation Needs (06/06/2023)   Received from [facility]    In the past 12 months, has lack of reliable transportation kept you from medical appointments, meetings, work or from getting things needed for daily living? : No  Physical Activity: Sufficiently Active (06/23/2022)   Exercise Vital Sign    Days of Exercise per Week: 4 days    Minutes of Exercise per Session: 60 min  Stress: No Stress Concern Present (06/23/2022)   [employer] of Occupational Health - Occupational Stress Questionnaire    Feeling of Stress : Not at all  Social Connections: Moderately Integrated (06/23/2022)   Social Connection and Isolation Panel [NHANES]    Frequency of Communication with Friends and Family: More than three times a week    Frequency of Social Gatherings with Friends and Family: More than three times a week    Attends Religious Services: More than 4 times per year    Active Member of [employer] or Organizations: Yes    Attends Banker Meetings: More than 4 times per year    Marital Status:  Widowed  Intimate Partner Violence: Not At Risk (06/23/2022)   Humiliation, Afraid, Rape, and Kick questionnaire    Fear of Current or Ex-Partner: No    Emotionally Abused: No    Physically Abused: No    Sexually Abused: No    Functional Status Survey:    Family History  Problem Relation Age of Onset   Coronary artery disease Father    Colon cancer Neg Hx    Stomach cancer Neg Hx    Esophageal cancer Neg Hx    Pancreatic cancer Neg Hx     Health Maintenance  Topic Date Due   COVID-19 Vaccine (7 - Mixed Product risk 2024-25 season) 08/10/2023   Medicare Annual Wellness (AWV)  05/18/2024   DTaP/Tdap/Td (3 - Td or Tdap) 07/07/2031   Pneumonia Vaccine 81+ Years old  Completed   INFLUENZA VACCINE  Completed  Zoster Vaccines- Shingrix  Completed   HPV VACCINES  Aged Out   FOOT EXAM  Discontinued   HEMOGLOBIN A1C  Discontinued   OPHTHALMOLOGY EXAM  Discontinued    Allergies  Allergen Reactions   Effexor [Venlafaxine]     Pt became lethargic, drowsy and not his usual self with effexor.   Contrast Media [Iodinated Contrast Media] Rash    Outpatient Encounter Medications as of 07/25/2023  Medication Sig   acetaminophen (TYLENOL) 325 MG tablet Take 650 mg by mouth every 6 (six) hours as needed.   acetaminophen (TYLENOL) 500 MG tablet Take 500 mg by mouth 2 (two) times daily.   apixaban (ELIQUIS) 5 MG TABS tablet Take 1 tablet (5 mg total) by mouth 2 (two) times daily.   atorvastatin (LIPITOR) 40 MG tablet Take 1 tablet (40 mg total) by mouth daily.   clotrimazole-betamethasone (LOTRISONE) cream Apply 1 Application topically 2 (two) times daily as needed. Fax#[phone]   docusate sodium (COLACE) 100 MG capsule Take 100 mg by mouth 2 (two) times daily.   feeding supplement (BOOST HIGH PROTEIN) LIQD Take 1 Container by mouth 2 (two) times daily between meals.   fexofenadine (ALLEGRA) 180 MG tablet Take 180 mg by mouth daily.   fluocinonide cream (LIDEX) 0.05 % Apply 1  Application topically 2 (two) times daily.   fluticasone (FLONASE) 50 MCG/ACT nasal spray Place 2 sprays into both nostrils daily.   furosemide (LASIX) 40 MG tablet Take 1.5 pills daily for 3 days then resume 40 mg daily. (Patient not taking: Reported on 06/23/2023)   gabapentin (NEURONTIN) 300 MG capsule TAKE 1 CAPSULE BY MOUTH THREE TIMES A DAY   Menthol, Topical Analgesic, (BIOFREEZE ROLL-ON) 4 % GEL Apply topically. Apply to neck topically two times a day   metoprolol succinate (TOPROL XL) 25 MG 24 hr tablet Take 0.5 tablets (12.5 mg total) by mouth in the morning and at bedtime.   mometasone (ELOCON) 0.1 % cream Apply 1 Application topically daily.   nitroGLYCERIN (NITROSTAT) 0.4 MG SL tablet Place 1 tablet (0.4 mg total) under the tongue every 5 (five) minutes as needed for chest pain.   nystatin (MYCOSTATIN/NYSTOP) powder Apply 1 Application topically 3 (three) times daily.   pantoprazole (PROTONIX) 40 MG tablet Take 1 tablet (40 mg total) by mouth daily.   polyvinyl alcohol (LIQUIFILM TEARS) 1.4 % ophthalmic solution Place 2 drops into the right eye 2 (two) times daily.   potassium chloride SA (KLOR-CON M) 20 MEQ tablet Take 1 tablet (20 mEq total) by mouth daily.   pramoxine (CERAVE ITCH RELIEF) 1 % LOTN Apply 1 Application topically 2 (two) times daily.   zinc oxide 20 % ointment Apply 1 Application topically daily at 12 noon. Apply to buttocks after applying Fluocinonide Cream   No facility-administered encounter medications on file as of 07/25/2023.    Review of Systems  Constitutional:  Negative for chills and fever.  HENT:  Negative for sinus pressure and sore throat.   Respiratory:  Negative for cough, shortness of breath and wheezing.   Cardiovascular:  Negative for chest pain, palpitations and leg swelling.  Gastrointestinal:  Negative for abdominal distention, abdominal pain, blood in stool, constipation, diarrhea, nausea and vomiting.  Genitourinary:  Negative for dysuria.   Neurological:  Negative for numbness.   Negative unless indicated in HPI.  There were no vitals filed for this visit. There is no height or weight on file to calculate BMI. BP Readings from Last 3 Encounters:  06/30/23 (!) 144/76  06/23/23 114/60  06/02/23 (!) 162/87   Wt Readings from Last 3 Encounters:  06/29/23 149 lb 6.4 oz (67.8 kg)  06/23/23 150 lb 6.4 oz (68.2 kg)  06/01/23 160 lb 3.2 oz (72.7 kg)   Physical Exam Constitutional:      Appearance: Normal appearance.  HENT:     Head: Normocephalic and atraumatic.  Cardiovascular:     Rate and Rhythm: Normal rate and regular rhythm.     Pulses: Normal pulses.     Heart sounds: Normal heart sounds.  Pulmonary:     Effort: No respiratory distress.     Breath sounds: No stridor. No wheezing or rales.  Abdominal:     General: Bowel sounds are normal. There is no distension.     Palpations: Abdomen is soft.     Tenderness: There is no abdominal tenderness. There is no right CVA tenderness or guarding.  Musculoskeletal:        General: Swelling (1+ minimal swelling on lower extremities) present.  Skin:    Comments: Chronic rash on his buttock   Neurological:     Mental Status: He is alert. Mental status is at baseline.     Labs reviewed: Basic Metabolic Panel: Recent Labs    03/15/23 0000 05/10/23 0000 06/23/23 1142  NA 134* 131* 140  K 4.0 3.7 3.6  CL 100 98* 102  CO2 26* 24* 20  GLUCOSE  --   --  107*  BUN 10 9 15   CREATININE 0.4* 0.5* 0.56*  CALCIUM 8.6* 8.6* 8.6   Liver Function Tests: Recent Labs    02/15/23 0000 05/10/23 0000 06/23/23 1142  AST 25 25 37  ALT 20 22 32  ALKPHOS 134* 129* 171*  BILITOT  --   --  1.0  PROT  --   --  6.0  ALBUMIN 3.5 3.3* 3.1*   No results for input(s): "LIPASE", "AMYLASE" in the last 8760 hours. No results for input(s): "AMMONIA" in the last 8760 hours. CBC: Recent Labs    03/08/23 0000 05/10/23 0000 06/23/23 1142  WBC 8.1 9.0 11.0*  NEUTROABS  --   7,344.00  --   HGB 13.5 12.3* 13.2  HCT 40* 37* 40.7  MCV  --   --  89  PLT 300 249 309   Cardiac Enzymes: No results for input(s): "CKTOTAL", "CKMB", "CKMBINDEX", "TROPONINI" in the last 8760 hours. BNP: Invalid input(s): "POCBNP" Lab Results  Component Value Date   HGBA1C 5.6 11/12/2022   Lab Results  Component Value Date   TSH 2.039 11/02/2021   Lab Results  Component Value Date   VITAMINB12 417 11/12/2022   No results found for: "FOLATE" Lab Results  Component Value Date   FERRITIN 256 11/03/2021    Imaging and Procedures obtained prior to SNF admission: DG Chest 2 View Result Date: 06/23/2023 CLINICAL DATA:  Cough for 2 months, hypertension EXAM: CHEST - 2 VIEW COMPARISON:  03/23/2022, 05/12/2022 FINDINGS: Frontal and lateral views of the chest demonstrate an enlarged cardiac silhouette. Bibasilar veiling opacities consistent with lower lobe consolidation and small bilateral pleural effusions. Patchy perihilar airspace disease is seen in the perihilar regions, right greater than left. No pneumothorax. No acute bony abnormalities. IMPRESSION: 1. Findings compatible with congestive heart failure and bilateral pleural effusions. Electronically Signed   By: Sharlet Salina M.D.   On: 06/23/2023 15:53    Assessment and Plan        Chronic dermatitis Patient constantly sits in the recliner chair or power scooter  No change Followed with dermatology continue with mometasone, flucinonide  A-fib No signs of bleeding Continue with Eliquis, metoprolol  Diastolic dysfunction Patient does not appear to be in distress Euvolemic Lungs clear Mild lower extremity swelling Continue with the Lasix  Generalized anxiety As per staff patient's mood is better Continue with Lexapro 5 mg   Hypertension Blood pressure at goal continue with metoprolol  Peripheral neuropathy Continue with the gabapentin  Osteoarthritis Continue with Tylenol as needed for pain Continue with  physical therapy  GERD Continue with pantoprazole.    30 min Total time spent for obtaining history,  performing a medically appropriate examination and evaluation, reviewing the tests,  documenting clinical information in the electronic or other health record,  ,care coordination (not separately reported)

## 2023-07-26 DIAGNOSIS — R29898 Other symptoms and signs involving the musculoskeletal system: Secondary | ICD-10-CM | POA: Diagnosis not present

## 2023-07-26 DIAGNOSIS — R1312 Dysphagia, oropharyngeal phase: Secondary | ICD-10-CM | POA: Diagnosis not present

## 2023-07-27 DIAGNOSIS — R29898 Other symptoms and signs involving the musculoskeletal system: Secondary | ICD-10-CM | POA: Diagnosis not present

## 2023-07-27 DIAGNOSIS — R1312 Dysphagia, oropharyngeal phase: Secondary | ICD-10-CM | POA: Diagnosis not present

## 2023-07-28 DIAGNOSIS — R1312 Dysphagia, oropharyngeal phase: Secondary | ICD-10-CM | POA: Diagnosis not present

## 2023-07-28 DIAGNOSIS — R29898 Other symptoms and signs involving the musculoskeletal system: Secondary | ICD-10-CM | POA: Diagnosis not present

## 2023-07-29 DIAGNOSIS — R29898 Other symptoms and signs involving the musculoskeletal system: Secondary | ICD-10-CM | POA: Diagnosis not present

## 2023-07-29 DIAGNOSIS — R1312 Dysphagia, oropharyngeal phase: Secondary | ICD-10-CM | POA: Diagnosis not present

## 2023-07-29 NOTE — Progress Notes (Signed)
 Cardiology Office Note:    Date:  08/08/2023   ID:  GRADIE OHM, DOB [birth year], MRN [id number]  PCP:  Donley Furth, MD   [facility] Providers Cardiologist:  Wendie Hamburg, MD Cardiology APP:  Lamond Pilot, [place]     Referring MD: Donley Furth, MD   Chief Complaint  Patient presents with   Follow-up    PAF, swelling    History of Present Illness:    Maaz L Katzenstein is a 89 y.o. male with a hx of HTN, GERD, and CAD with STEMI Feb 2023, chronic systolic and diastolic heart failure, hx of PE and PAF. LHC with thrombotic occlusion of 90% in the mid LADwith 99% subtotal occlusion in the mid-distal LAD and otherwise nonobstructive disease. DES x 2 non-overlapping 3.0 x 12 mm and 2.0 x 15 mm in the LAD. He was treated with DAPT with ASA and brilinta. Echo with LVEF 40-45%.   Hospitalization 10/2021 with COVID 19 and PE started on eliquis and continued on brilinta, ASA discontinued. Echo at that time showed LVEF 65-70% an dgrade 2 DD. Given falls, repeat CTA in 04/2022 showed no PE and he was taken off eliquis after 6 months of therapy. He remained on ASA and plavix given LAD stent.   He was seen and by Dr. Loetta Ringer 03/2023.  He now resides in skilled nursing facility.  He walks but uses a wheelchair.  EKG at that time showed atrial fibrillation.  He was continued on Eliquis and DAPT was discontinued.  I saw him in clinic 05/12/2023.  Daughter described a chest x-ray showing congestion but no pneumonia.  He described a 20 pound weight gain in 1 month since seeing Dr. Loetta Ringer.  He has shortness of breath and orthopnea and was placed on O2 2 days ago.  He reports improvement with 40 mg Lasix.  I was concerned about heart failure exacerbation due to newly recognized A-fib 03/2023.  He is rate controlled.  Plan was to symptomatically manage his heart failure and rate control his A-fib.  If he continues to have issues with heart failure with escalating doses of Lasix, could  consider amiodarone after adequate anticoagulation.  He is not a cardioversion candidate due to need for sedation.  In addition, on that exam I found his fingers blue and cold on the right hand with an intact radial pulse.  He was able to get an arterial Doppler which ruled out vascular compromise on that side.  He has had a long history of elbow fracture and is able to rub his fingers to relieve the blueness.  Symptoms worsen when his arm hangs down.  He denied pain.  Ultrasound tech also reported no concern for DVT.  Patient is very HOH in visits are primarily conducted by writing on a white board.  He is also very stoic, symptoms can be difficult.  I continue to 40 mg of Lasix daily indefinitely with 20 mEq potassium.  I saw him back 06/23/23 - paperwork from facility very difficult to determine if he remained on lasix - through a phone call later with the RN I believe he was taking lasix. Bilateral crackles and rhonchi combined with cough prompted CXR which showed no PNA but CHF changes.   He presents back today for follow up. Overall doing very well. No cardiac complaints. Appears euvolemic. Initial vitals with bradycardia, but EKG confirms Afib with VR 55.    Past Medical History:  Diagnosis Date   Arthritis  Diverticulosis of colon    External thrombosed hemorrhoids 06/11/2009   Qualifier: Diagnosis of  By: Alyne Babinski MD, Lenis Quin    GERD (gastroesophageal reflux disease)    Hypertension     Past Surgical History:  Procedure Laterality Date   cataract extaction, right  6/09   per Dr. Norberta Beans   CATARACT EXTRACTION Left    COLONOSCOPY  09/23/04   per Dr. Toribio Frees, clear, no repeats needed    CORONARY STENT INTERVENTION N/A 06/06/2021   Procedure: CORONARY STENT INTERVENTION;  Surgeon: Sammy Crisp, MD;  Location: [facility];  Service: Cardiovascular;  Laterality: N/A;   CORONARY/GRAFT ACUTE MI REVASCULARIZATION N/A 06/06/2021   Procedure: Coronary/Graft Acute MI  Revascularization;  Surgeon: Sammy Crisp, MD;  Location: [facility] INVASIVE CV [facility];  Service: Cardiovascular;  Laterality: N/A;   EGD and esophageal diatation,  05-10-11   per Dr. Howard Macho     HERNIA REPAIR     LEFT HEART CATH AND CORONARY ANGIOGRAPHY N/A 06/06/2021   Procedure: LEFT HEART CATH AND CORONARY ANGIOGRAPHY;  Surgeon: Sammy Crisp, MD;  Location: [facility] CV [facility];  Service: Cardiovascular;  Laterality: N/A;   rt inguinal hernia     x2    Current Medications: Current Meds  Medication Sig   acetaminophen (TYLENOL) 325 MG tablet Take 650 mg by mouth every 6 (six) hours as needed.   acetaminophen (TYLENOL) 500 MG tablet Take 500 mg by mouth 2 (two) times daily.   apixaban (ELIQUIS) 5 MG TABS tablet Take 1 tablet (5 mg total) by mouth 2 (two) times daily.   atorvastatin (LIPITOR) 40 MG tablet Take 1 tablet (40 mg total) by mouth daily.   clotrimazole-betamethasone (LOTRISONE) cream Apply 1 Application topically 2 (two) times daily as needed. Fax#[phone]   docusate sodium (COLACE) 100 MG capsule Take 100 mg by mouth 2 (two) times daily.   feeding supplement (BOOST HIGH PROTEIN) LIQD Take 1 Container by mouth 2 (two) times daily between meals.   fexofenadine (ALLEGRA) 180 MG tablet Take 180 mg by mouth daily.   fluocinonide cream (LIDEX) 0.05 % Apply 1 Application topically 2 (two) times daily.   fluticasone (FLONASE) 50 MCG/ACT nasal spray Place 2 sprays into both nostrils daily.   furosemide (LASIX) 40 MG tablet Take 1.5 pills daily for 3 days then resume 40 mg daily.   gabapentin (NEURONTIN) 300 MG capsule TAKE 1 CAPSULE BY MOUTH THREE TIMES A DAY   Menthol, Topical Analgesic, (BIOFREEZE ROLL-ON) 4 % GEL Apply topically. Apply to neck topically two times a day   metoprolol succinate (TOPROL XL) 25 MG 24 hr tablet Take 0.5 tablets (12.5 mg total) by mouth in the morning and at bedtime.   mometasone (ELOCON) 0.1 % cream Apply 1 Application topically daily.   nitroGLYCERIN  (NITROSTAT) 0.4 MG SL tablet Place 1 tablet (0.4 mg total) under the tongue every 5 (five) minutes as needed for chest pain.   nystatin (MYCOSTATIN/NYSTOP) powder Apply 1 Application topically 3 (three) times daily.   pantoprazole (PROTONIX) 40 MG tablet Take 1 tablet (40 mg total) by mouth daily.   polyvinyl alcohol (LIQUIFILM TEARS) 1.4 % ophthalmic solution Place 2 drops into the right eye 2 (two) times daily.   potassium chloride SA (KLOR-CON M) 20 MEQ tablet Take 1 tablet (20 mEq total) by mouth daily.   pramoxine (CERAVE ITCH RELIEF) 1 % LOTN Apply 1 Application topically 2 (two) times daily.   zinc oxide 20 % ointment Apply 1 Application topically daily at 12  noon. Apply to buttocks after applying Fluocinonide Cream     Allergies:   Effexor [venlafaxine] and Contrast media [iodinated contrast media]   Social History   Socioeconomic History   Marital status: Widowed    Spouse name: Not on file   Number of children: 3   Years of education: Not on file   Highest education level: Not on file  Occupational History   Occupation: retired  Tobacco Use   Smoking status: Former    Current packs/day: 15.00    Average packs/day: 15.0 packs/day for 0.5 years (7.5 ttl pk-yrs)    Types: Cigarettes   Smokeless tobacco: Never   Tobacco comments:    smoked 18 and stopped and states he stopped 30's   Vaping Use   Vaping status: Never Used  Substance and Sexual Activity   Alcohol use: No    Alcohol/week: 0.0 standard drinks of alcohol   Drug use: No   Sexual activity: Never    Birth control/protection: Abstinence  Other Topics Concern   Not on file  Social History Narrative   Lives in studio at [facility]   Widowed since 2009, was married x 57 years.    3 daughters.    Social Drivers of Corporate investment banker Strain: Low Risk  (06/23/2022)   Overall Financial Resource Strain (CARDIA)    Difficulty of Paying Living Expenses: Not hard at all  Food Insecurity: Low Risk   (06/06/2023)   Received from [facility]   Hunger Vital Sign    Worried About Running Out of Food in the Last Year: Never true    Ran Out of Food in the Last Year: Never true  Transportation Needs: No Transportation Needs (06/06/2023)   Received from [facility]    In the past 12 months, has lack of reliable transportation kept you from medical appointments, meetings, work or from getting things needed for daily living? : No  Physical Activity: Sufficiently Active (06/23/2022)   Exercise Vital Sign    Days of Exercise per Week: 4 days    Minutes of Exercise per Session: 60 min  Stress: No Stress Concern Present (06/23/2022)   [employer] of Occupational Health - Occupational Stress Questionnaire    Feeling of Stress : Not at all  Social Connections: Moderately Integrated (06/23/2022)   Social Connection and Isolation Panel [NHANES]    Frequency of Communication with Friends and Family: More than three times a week    Frequency of Social Gatherings with Friends and Family: More than three times a week    Attends Religious Services: More than 4 times per year    Active Member of [employer] or Organizations: Yes    Attends Banker Meetings: More than 4 times per year    Marital Status: Widowed     Family History: The patient's family history includes Coronary artery disease in his father. There is no history of Colon cancer, Stomach cancer, Esophageal cancer, or Pancreatic cancer.  ROS:   Please see the history of present illness.     All other systems reviewed and are negative.  EKGs/Labs/Other Studies Reviewed:    The following studies were reviewed today: Cardiac Studies & Procedures   ______________________________________________________________________________________________ CARDIAC CATHETERIZATION  CARDIAC CATHETERIZATION 06/06/2021  Narrative Conclusions: Severe single-vessel coronary artery disease with thrombotic 90% mid LAD stenosis  and 99% subtotal occlusion of the distal LAD with TIMI-1 flow.  Mild, nonobstructive coronary artery disease noted in the LCx.  RCA  is ectatic without significant stenosis. Normal left ventricular filling pressure (LVEDP ~7 mmHg). Successful PCI to mid and distal LAD using nonoverlapping Onyx Frontier 3.0 x 12 mm (mid LAD) and 2.0 x 15 mm (distal LAD) drug-eluting stents with 0% residual stenosis and TIMI-2 flow into the distal vessel.  Poor reflow most likely related to microvascular dysfunction from late presentation.  Recommendations: Admit to 2H-ICU for post STEMI monitoring/care. Complete 2 hours of cangrelor infusion. Dual antiplatelet therapy with aspirin and ticagrelor for at least 12 months.  If atrial fibrillation persists during the hospitalization, we may need to consider transitioning to clopidogrel plus NOAC prior to discharge. Gentle post-catheterization hydration, given normal LVEDP but likely reduced LVEF. Obtain echocardiogram. Aggressive secondary prevention of coronary artery disease.  Sammy Crisp, MD [facility]  Findings Coronary Findings Diagnostic  Dominance: Right  Left Main Vessel is large. Vessel is angiographically normal.  Left Anterior Descending Vessel is large. Ost LAD lesion is 20% stenosed. Prox LAD to Mid LAD lesion is 40% stenosed. Mid LAD lesion is 90% stenosed. The lesion is thrombotic. Dist LAD lesion is 99% stenosed.  First Diagonal Branch Vessel is small in size.  Second Diagonal Branch Vessel is small in size.  Third Diagonal Branch Vessel is moderate in size.  Left Circumflex Vessel is large. There is mild diffuse disease throughout the vessel.  First Obtuse Marginal Branch Vessel is small in size.  Second Obtuse Marginal Branch Vessel is moderate in size.  Third Obtuse Marginal Branch Vessel is small in size.  Right Coronary Artery Vessel is large. The vessel is ectatic.  Right Posterior Descending Artery Vessel  is large in size.  Right Posterior Atrioventricular Artery Vessel is large in size.  First Right Posterolateral Branch Vessel is small in size.  Second Right Posterolateral Branch Vessel is large in size.  Third Right Posterolateral Branch Vessel is small in size.  Intervention  Mid LAD lesion Stent CATH LAUNCHER 6FR EBU3.5 guide catheter was inserted. Lesion crossed with guidewire using a WIRE RUNTHROUGH .[id number]. Pre-stent angioplasty was performed using a BALLN SAPPHIRE [place] 2.25X12. Maximum pressure:  6 atm. A drug-eluting stent was successfully placed using a STENT ONYX FRONTIER 3.0X12. Maximum pressure: 16 atm. Stent strut is well apposed. Stent does not overlap previously placed stentPost-stent angioplasty was performed using a BALLN SAPPHIRE [place] 3.25X8. Maximum pressure:  18 atm. Post-Intervention Lesion Assessment The intervention was successful. Pre-interventional TIMI flow is 3. Post-intervention TIMI flow is 3. No complications occurred at this lesion. There is a 0% residual stenosis post intervention.  Dist LAD lesion Stent Lesion crossed with guidewire using a WIRE RUNTHROUGH .[id number]. Pre-stent angioplasty was performed using a BALLN SAPPHIRE [place] 2.25X12. Maximum pressure:  8 atm. A drug-eluting stent was successfully placed using a STENT ONYX FRONTIER 2.0X15. Maximum pressure: 16 atm. Stent strut is well apposed. Post-stent angioplasty was performed using a BALLN SAPPHIRE [place] 2.25X12. Maximum pressure:  16 atm. Post-Intervention Lesion Assessment The intervention was successful. Pre-interventional TIMI flow is 1. Post-intervention TIMI flow is 2. No-reflow occurred during the intervention. IC adenosine and nitroglycerin was given. There is a 0% residual stenosis post intervention.     ECHOCARDIOGRAM  ECHOCARDIOGRAM COMPLETE 11/03/2021  Narrative ECHOCARDIOGRAM REPORT    Patient Name:   CASTLE LAMONS Date of Exam: 11/03/2021 Medical Rec #:  [id number]        Height:       67.0 in Accession #:    [id number]      Weight:  156.5 lb Date of Birth:  1924/08/25       BSA:          1.822 m Patient Age:    89 years        BP:           151/63 mmHg Patient Gender: M               HR:           59 bpm. Exam Location:  Inpatient  Procedure: 2D Echo, Cardiac Doppler and Color Doppler  Indications:    Elevated troponin  History:        Patient has prior history of Echocardiogram examinations. CAD, Arrythmias:Atrial Fibrillation; Risk Factors:Hypertension.  Sonographer:    Pamelia Boehringer Referring Phys: [id number] ANASTASSIA DOUTOVA  IMPRESSIONS   1. Left ventricular ejection fraction, by estimation, is 65 to 70%. The left ventricle has normal function. The left ventricle has no regional wall motion abnormalities. Left ventricular diastolic parameters are consistent with Grade II diastolic dysfunction (pseudonormalization). Elevated left atrial pressure. 2. Right ventricular systolic function is normal. The right ventricular size is normal. There is mildly elevated pulmonary artery systolic pressure. The estimated right ventricular systolic pressure is 39.6 mmHg. 3. Left atrial size was mildly dilated. 4. The mitral valve is normal in structure. Mild mitral valve regurgitation. 5. The aortic valve is tricuspid. There is mild calcification of the aortic valve. There is mild thickening of the aortic valve. Aortic valve regurgitation is trivial. Aortic valve sclerosis is present, with no evidence of aortic valve stenosis. 6. The inferior vena cava is dilated in size with >50% respiratory variability, suggesting right atrial pressure of 8 mmHg.  Comparison(s): Prior images reviewed side by side. The left ventricular function has improved. The left ventricular wall motion abnormality is improved.  FINDINGS Left Ventricle: Left ventricular ejection fraction, by estimation, is 65 to 70%. The left ventricle has normal function. The left ventricle has no regional  wall motion abnormalities. The left ventricular internal cavity size was normal in size. There is borderline concentric left ventricular hypertrophy. Left ventricular diastolic parameters are consistent with Grade II diastolic dysfunction (pseudonormalization). Elevated left atrial pressure.  Right Ventricle: The right ventricular size is normal. No increase in right ventricular wall thickness. Right ventricular systolic function is normal. There is mildly elevated pulmonary artery systolic pressure. The tricuspid regurgitant velocity is 2.81 m/s, and with an assumed right atrial pressure of 8 mmHg, the estimated right ventricular systolic pressure is 39.6 mmHg.  Left Atrium: Left atrial size was mildly dilated.  Right Atrium: Right atrial size was normal in size. Prominent Eustachian valve.  Pericardium: There is no evidence of pericardial effusion.  Mitral Valve: The mitral valve is normal in structure. Mild mitral valve regurgitation, with centrally-directed jet.  Tricuspid Valve: The tricuspid valve is normal in structure. Tricuspid valve regurgitation is mild.  Aortic Valve: The aortic valve is tricuspid. There is mild calcification of the aortic valve. There is mild thickening of the aortic valve. Aortic valve regurgitation is trivial. Aortic regurgitation PHT measures 721 msec. Aortic valve sclerosis is present, with no evidence of aortic valve stenosis. Aortic valve peak gradient measures 4.8 mmHg.  Pulmonic Valve: The pulmonic valve was grossly normal. Pulmonic valve regurgitation is not visualized.  Aorta: The aortic root and ascending aorta are structurally normal, with no evidence of dilitation.  Venous: The inferior vena cava is dilated in size with greater than 50% respiratory variability, suggesting right atrial pressure of 8  mmHg.  IAS/Shunts: No atrial level shunt detected by color flow Doppler.   LEFT VENTRICLE PLAX 2D LVIDd:         3.90 cm     Diastology LVIDs:          2.40 cm     LV e' medial:    4.79 cm/s LV PW:         1.10 cm     LV E/e' medial:  17.7 LV IVS:        1.20 cm     LV e' lateral:   6.53 cm/s LVOT diam:     2.00 cm     LV E/e' lateral: 13.0 LV SV:         56 LV SV Index:   31 LVOT Area:     3.14 cm  LV Volumes (MOD) LV vol d, MOD A2C: 98.5 ml LV vol d, MOD A4C: 99.1 ml LV vol s, MOD A2C: 36.3 ml LV vol s, MOD A4C: 36.8 ml LV SV MOD A2C:     62.2 ml LV SV MOD A4C:     99.1 ml LV SV MOD BP:      62.0 ml  RIGHT VENTRICLE             IVC RV Basal diam:  3.40 cm     IVC diam: 2.20 cm RV Mid diam:    2.40 cm RV S prime:     10.90 cm/s TAPSE (M-mode): 1.9 cm  LEFT ATRIUM             Index        RIGHT ATRIUM           Index LA diam:        4.00 cm 2.20 cm/m   RA Area:     13.80 cm LA Vol (A2C):   74.7 ml 40.99 ml/m  RA Volume:   33.70 ml  18.49 ml/m LA Vol (A4C):   70.9 ml 38.91 ml/m LA Biplane Vol: 73.6 ml 40.39 ml/m AORTIC VALVE AV Area (Vmax): 2.24 cm AV Vmax:        109.00 cm/s AV Peak Grad:   4.8 mmHg LVOT Vmax:      77.70 cm/s LVOT Vmean:     51.900 cm/s LVOT VTI:       0.178 m AI PHT:         721 msec  AORTA Ao Root diam: 3.30 cm Ao Asc diam:  3.10 cm  MITRAL VALVE               TRICUSPID VALVE MV Area (PHT): 2.72 cm    TR Peak grad:   31.6 mmHg MV Decel Time: 279 msec    TR Vmax:        281.00 cm/s MV E velocity: 84.84 cm/s MV A velocity: 89.10 cm/s  SHUNTS MV E/A ratio:  0.95        Systemic VTI:  0.18 m Systemic Diam: 2.00 cm  Luana Rumple MD Electronically signed by Luana Rumple MD Signature Date/Time: 11/03/2021/9:02:33 AM    Final          ______________________________________________________________________________________________           Recent Labs: 06/23/2023: ALT 32; BNP 231.7; BUN 15; Creatinine, Ser 0.56; Hemoglobin 13.2; Platelets 309; Potassium 3.6; Sodium 140  Recent Lipid Panel    Component Value Date/Time   CHOL 79 11/12/2022 0000   TRIG 80 11/12/2022 0000    HDL 33 (A) 11/12/2022  0000   CHOLHDL 2 10/14/2021 1141   VLDL 14.2 10/14/2021 1141   LDLCALC 30 11/12/2022 0000   LDLDIRECT 72.0 09/27/2017 0939     Risk Assessment/Calculations:    CHA2DS2-VASc Score = 7   This indicates a 11.2% annual risk of stroke. The patient's score is based upon: CHF History: 1 HTN History: 1 Diabetes History: 0 Stroke History: 2 Vascular Disease History: 1 Age Score: 2 Gender Score: 0             Physical Exam:    VS:  BP (!) 118/55 (BP Location: Left Arm, Patient Position: Sitting, Cuff Size: Normal)   Pulse (!) 55   Ht 5\' 6"  (1.676 m)   Wt 148 lb (67.1 kg)   SpO2 91%   BMI 23.89 kg/m     Wt Readings from Last 3 Encounters:  08/08/23 148 lb (67.1 kg)  07/25/23 147 lb 1.6 oz (66.7 kg)  06/29/23 149 lb 6.4 oz (67.8 kg)     GEN:  Well nourished, well developed in no acute distress HEENT: Normal NECK: No JVD; No carotid bruits LYMPHATICS: No lymphadenopathy CARDIAC: RRR, no murmurs, rubs, gallops RESPIRATORY:  Clear to auscultation without rales, wheezing or rhonchi  ABDOMEN: Soft, non-tender, non-distended MUSCULOSKELETAL:  No edema; No deformity  SKIN: Warm and dry NEUROLOGIC:  Alert and oriented x 3 PSYCHIATRIC:  Normal affect   ASSESSMENT:    1. Paroxysmal atrial fibrillation (HCC)   2. CAD S/P percutaneous coronary angioplasty   3. Primary hypertension   4. Hyperlipidemia with target LDL less than 70   5. Acute on chronic combined systolic and diastolic congestive heart failure (HCC)   6. PAF (paroxysmal atrial fibrillation) (HCC)   7. Chronic anticoagulation    PLAN:    In order of problems listed above:  CAD s/p STEMI - DES x 2 LAD - No DAPT give eliquis -- conitnue beta-blocker, statin   Hyperlipidemia with LDL goal less than 70 - Continue statin   Chronic systolic and diastolic heart failure, now with improved EF - reassuring echo - will continue 40 mg lasix daily with 20 mEq potassium - BNP at last visit  232, albumin 3.1   Hx of PE in the setting of COVID-19 PNA 10/2021 - completed 6 months of Eliquis therapy - Follow-up CTA negative for PE - initial plan was to discontinue eliquis but subsequently continued in light of newly recognized Afib 03/2023   PAF -- newly recognized 03/2023 -- rate controlled --Concerned that his A-fib may be contributing to CHF exacerbation - He remains rate controlled, and has now been on Eliquis for greater than 3 weeks - We could entertain amiodarone -- he is asymptomatic with Afib, VR today 55, no dizziness or near syncope   Follow up in 6 months.     Medication Adjustments/Labs and Tests Ordered: Current medicines are reviewed at length with the patient today.  Concerns regarding medicines are outlined above.  Orders Placed This Encounter  Procedures   EKG 12-Lead   No orders of the defined types were placed in this encounter.   Patient Instructions  Medication Instructions:  Your physician recommends that you continue on your current medications as directed. Please refer to the Current Medication list given to you today.  *If you need a refill on your cardiac medications before your next appointment, please call your pharmacy*  Lab Work: NONE ordered at this time of appointment    Testing/Procedures: NONE ordered at this time of appointment  Follow-Up: At [facility], you and your health needs are our priority.  As part of our continuing mission to provide you with exceptional heart care, our providers are all part of one team.  This team includes your primary Cardiologist (physician) and Advanced Practice Providers or APPs (Physician Assistants and Nurse Practitioners) who all work together to provide you with the care you need, when you need it.  Your next appointment:   6 month(s)  Provider:   Wendie Hamburg, MD  or Marcie Sever, PA-C        We recommend signing up for the patient portal called "MyChart".  Sign  up information is provided on this After Visit Summary.  MyChart is used to connect with patients for Virtual Visits (Telemedicine).  Patients are able to view lab/test results, encounter notes, upcoming appointments, etc.  Non-urgent messages can be sent to your provider as well.   To learn more about what you can do with MyChart, go to [email or web address].   Other Instructions       1st Floor: - Lobby - Registration  - Pharmacy  - Lab - Cafe  2nd Floor: - PV Lab - Diagnostic Testing (echo, CT, nuclear med)  3rd Floor: - Vacant  4th Floor: - TCTS (cardiothoracic surgery) - AFib Clinic - Structural Heart Clinic - Vascular Surgery  - Vascular Ultrasound  5th Floor: - HeartCare Cardiology (general and EP) - Clinical Pharmacy for coumadin, hypertension, lipid, weight-loss medications, and med management appointments    Valet parking services will be available as well.      Signed, Lamond Pilot, [place]  08/08/2023 12:09 PM    [facility]

## 2023-08-01 DIAGNOSIS — R29898 Other symptoms and signs involving the musculoskeletal system: Secondary | ICD-10-CM | POA: Diagnosis not present

## 2023-08-01 DIAGNOSIS — R1312 Dysphagia, oropharyngeal phase: Secondary | ICD-10-CM | POA: Diagnosis not present

## 2023-08-03 DIAGNOSIS — R1312 Dysphagia, oropharyngeal phase: Secondary | ICD-10-CM | POA: Diagnosis not present

## 2023-08-03 DIAGNOSIS — R29898 Other symptoms and signs involving the musculoskeletal system: Secondary | ICD-10-CM | POA: Diagnosis not present

## 2023-08-05 DIAGNOSIS — R29898 Other symptoms and signs involving the musculoskeletal system: Secondary | ICD-10-CM | POA: Diagnosis not present

## 2023-08-05 DIAGNOSIS — R1312 Dysphagia, oropharyngeal phase: Secondary | ICD-10-CM | POA: Diagnosis not present

## 2023-08-05 NOTE — Telephone Encounter (Signed)
 Lmom for pts daughter. Waiting on a return call to discuss lab work. Not sure if she would like this mailed. Lab work was previously faxed to pts facility that he resides at.

## 2023-08-07 DIAGNOSIS — R1312 Dysphagia, oropharyngeal phase: Secondary | ICD-10-CM | POA: Diagnosis not present

## 2023-08-07 DIAGNOSIS — R29898 Other symptoms and signs involving the musculoskeletal system: Secondary | ICD-10-CM | POA: Diagnosis not present

## 2023-08-08 ENCOUNTER — Encounter: Payer: Self-pay | Admitting: Physician Assistant

## 2023-08-08 ENCOUNTER — Ambulatory Visit: Payer: Medicare Other | Attending: Physician Assistant | Admitting: Physician Assistant

## 2023-08-08 VITALS — BP 118/55 | HR 55 | Ht 66.0 in | Wt 148.0 lb

## 2023-08-08 DIAGNOSIS — R29898 Other symptoms and signs involving the musculoskeletal system: Secondary | ICD-10-CM | POA: Diagnosis not present

## 2023-08-08 DIAGNOSIS — Z7901 Long term (current) use of anticoagulants: Secondary | ICD-10-CM

## 2023-08-08 DIAGNOSIS — I2510 Atherosclerotic heart disease of native coronary artery without angina pectoris: Secondary | ICD-10-CM

## 2023-08-08 DIAGNOSIS — I480 Paroxysmal atrial fibrillation: Secondary | ICD-10-CM

## 2023-08-08 DIAGNOSIS — R1312 Dysphagia, oropharyngeal phase: Secondary | ICD-10-CM | POA: Diagnosis not present

## 2023-08-08 DIAGNOSIS — Z9861 Coronary angioplasty status: Secondary | ICD-10-CM | POA: Diagnosis not present

## 2023-08-08 DIAGNOSIS — I5043 Acute on chronic combined systolic (congestive) and diastolic (congestive) heart failure: Secondary | ICD-10-CM | POA: Diagnosis not present

## 2023-08-08 DIAGNOSIS — I10 Essential (primary) hypertension: Secondary | ICD-10-CM | POA: Diagnosis not present

## 2023-08-08 DIAGNOSIS — E785 Hyperlipidemia, unspecified: Secondary | ICD-10-CM

## 2023-08-08 NOTE — Telephone Encounter (Signed)
 Closing note. Pt was seen in office today. See office note for further details.

## 2023-08-08 NOTE — Patient Instructions (Signed)
 Medication Instructions:  Your physician recommends that you continue on your current medications as directed. Please refer to the Current Medication list given to you today.  *If you need a refill on your cardiac medications before your next appointment, please call your pharmacy*  Lab Work: NONE ordered at this time of appointment    Testing/Procedures: NONE ordered at this time of appointment    Follow-Up: At [facility], you and your health needs are our priority.  As part of our continuing mission to provide you with exceptional heart care, our providers are all part of one team.  This team includes your primary Cardiologist (physician) and Advanced Practice Providers or APPs (Physician Assistants and Nurse Practitioners) who all work together to provide you with the care you need, when you need it.  Your next appointment:   6 month(s)  Provider:   Wendie Hamburg, MD  or Marcie Sever, PA-C        We recommend signing up for the patient portal called "MyChart".  Sign up information is provided on this After Visit Summary.  MyChart is used to connect with patients for Virtual Visits (Telemedicine).  Patients are able to view lab/test results, encounter notes, upcoming appointments, etc.  Non-urgent messages can be sent to your provider as well.   To learn more about what you can do with MyChart, go to [email or web address].   Other Instructions       1st Floor: - Lobby - Registration  - Pharmacy  - Lab - Cafe  2nd Floor: - PV Lab - Diagnostic Testing (echo, CT, nuclear med)  3rd Floor: - Vacant  4th Floor: - TCTS (cardiothoracic surgery) - AFib Clinic - Structural Heart Clinic - Vascular Surgery  - Vascular Ultrasound  5th Floor: - [facility] (general and EP) - Clinical Pharmacy for coumadin, hypertension, lipid, weight-loss medications, and med management appointments    Valet parking services will be available as well.

## 2023-08-09 ENCOUNTER — Non-Acute Institutional Stay (SKILLED_NURSING_FACILITY): Payer: Self-pay | Admitting: Nurse Practitioner

## 2023-08-09 ENCOUNTER — Encounter: Payer: Self-pay | Admitting: Nurse Practitioner

## 2023-08-09 DIAGNOSIS — I5043 Acute on chronic combined systolic (congestive) and diastolic (congestive) heart failure: Secondary | ICD-10-CM | POA: Diagnosis not present

## 2023-08-09 DIAGNOSIS — R1312 Dysphagia, oropharyngeal phase: Secondary | ICD-10-CM | POA: Diagnosis not present

## 2023-08-09 DIAGNOSIS — I480 Paroxysmal atrial fibrillation: Secondary | ICD-10-CM

## 2023-08-09 DIAGNOSIS — E785 Hyperlipidemia, unspecified: Secondary | ICD-10-CM | POA: Diagnosis not present

## 2023-08-09 DIAGNOSIS — L309 Dermatitis, unspecified: Secondary | ICD-10-CM | POA: Diagnosis not present

## 2023-08-09 DIAGNOSIS — K219 Gastro-esophageal reflux disease without esophagitis: Secondary | ICD-10-CM

## 2023-08-09 DIAGNOSIS — I10 Essential (primary) hypertension: Secondary | ICD-10-CM | POA: Diagnosis not present

## 2023-08-09 DIAGNOSIS — E871 Hypo-osmolality and hyponatremia: Secondary | ICD-10-CM

## 2023-08-09 DIAGNOSIS — G609 Hereditary and idiopathic neuropathy, unspecified: Secondary | ICD-10-CM | POA: Diagnosis not present

## 2023-08-09 DIAGNOSIS — R29898 Other symptoms and signs involving the musculoskeletal system: Secondary | ICD-10-CM | POA: Diagnosis not present

## 2023-08-09 NOTE — Assessment & Plan Note (Signed)
 followed by dermatology, presently: Mometasone, Fluocinonide, chronic, moist and pressure in buttocks since the patient sitting in w/c most of time.

## 2023-08-09 NOTE — Assessment & Plan Note (Addendum)
 resumed Eliquis and dc'd ASA/Plavix 03/2023 per cardiology, Hx of PE, on  Metoprolol

## 2023-08-09 NOTE — Assessment & Plan Note (Signed)
 Compensated clinically,  followed by Cardiology, BNP 231/7 06/23/23, continue Furosemide.

## 2023-08-09 NOTE — Assessment & Plan Note (Signed)
 Na 140 07/07/23

## 2023-08-09 NOTE — Assessment & Plan Note (Addendum)
 Loose Bp control, Sbp in 150s,  on Metoprolol 

## 2023-08-09 NOTE — Assessment & Plan Note (Signed)
 Lower body weakness, on Gabapentin

## 2023-08-09 NOTE — Assessment & Plan Note (Signed)
 on Atorvastatin, LDL 30 11/12/22

## 2023-08-09 NOTE — Progress Notes (Signed)
 Location:  [facility] Room Number: N065-A Place of Service:  SNF (31) Provider:  Delphina Schum X, NP    Patient Care Team: Donley Furth, MD as PCP - General (Family Medicine) Wendie Hamburg, MD as PCP - Cardiology (Cardiology) Maris Sickle, MD as Consulting Physician (Ophthalmology) Seward Dao Alleen Arbour, MD as Consulting Physician (Ophthalmology) Duke, Warren Haber, PA as Physician Assistant (Cardiology)  Extended Emergency Contact Information Primary Emergency Contact: Garrett,Cathy  [place] Home Phone: [phone] Mobile Phone: [phone] Relation: Daughter Secondary Emergency Contact: Shepard,Susan  [place] Home Phone: [phone] Relation: Daughter  Code Status:  Full Code Goals of care: Advanced Directive information    08/09/2023   10:59 AM  Advanced Directives  Does Patient Have a Medical Advance Directive? Yes  Type of Advance Directive Living will;Healthcare Power of Attorney  Does patient want to make changes to medical advance directive? No - Patient declined  Copy of Healthcare Power of Attorney in Chart? Yes - validated most recent copy scanned in chart (See row information)     Chief Complaint  Patient presents with   Medical Management of Chronic Issues    Routine Visit    HPI:  Pt is a 89 y.o. male seen today for medical management of chronic diseases.    Chronic dermatitis buttocks, followed by dermatology, presently: Mometasone, Fluocinonide , Nystatin  Dysphagia, choked in the past, chopped meat with thin fluid presently. Underwent GI evaluation. CXR normal 03/07/23             Anxiety, failed effexor, stated sleeps well at night.              The right arm pain, more at night, Tylenol  at night helped                03/24/23 right elbow fx(distal end of right humerus), mild swelling fingers, pain is well not controlled, in hinged elbow ROM brace during day, therapy is going to try  different brace at night, has been to surgeon-non surgical candidate. The patient desires delaying X-ray.              CAD ST elevation MI LAD 05/2021, underwent Cath, stenting, s/p percutaneous coronary angioplasty, started DAPT, Echo EF 40-45%,  65-70% 11/02/21, started Plavix  12/24/21 per [facility]             PE 11/02/21, had CTA 05/12/22, 09/08/22 Cardiology dc Eliquis , then resumed 03/2023 and off ASA and Plavix  03/2023 in setting of Afib.  Repeated CT @ 6 mos,  negative PE             Grade 2 diastolic dysfunction, swelling BLE, SOB, placed on Furosemide  in setting of weight gained, improved, followed by Cardiology, BNP 231/7 06/23/23             Afib, resumed Eliquis  and dc'd ASA/Plavix  03/2023 per cardiology, on  Metoprolol              GERD, thing liquid, hx of dilatation,  takes Pantoprazole , Hgb 13.2 06/23/23             Hypokalemia, on Kcl K 3.9 07/07/23             Hyponatremia, Na 140 07/07/23             HLD on Atorvastatin , LDL 30 11/12/22             HTN, on Metoprolol   Peripheral neuropathy, on Gabapentin   Cognitive impairment: MMSE 19/30 05/20/23, SNF [facility] for care needs.      Past Medical History:  Diagnosis Date   Arthritis    Diverticulosis of colon    External thrombosed hemorrhoids 06/11/2009   Qualifier: Diagnosis of  By: Alyne Babinski MD, Lenis Quin    GERD (gastroesophageal reflux disease)    Hypertension    Past Surgical History:  Procedure Laterality Date   cataract extaction, right  6/09   per Dr. Norberta Beans   CATARACT EXTRACTION Left    COLONOSCOPY  09/23/04   per Dr. Toribio Frees, clear, no repeats needed    CORONARY STENT INTERVENTION N/A 06/06/2021   Procedure: CORONARY STENT INTERVENTION;  Surgeon: Sammy Crisp, MD;  Location: [facility];  Service: Cardiovascular;  Laterality: N/A;   CORONARY/GRAFT ACUTE MI REVASCULARIZATION N/A 06/06/2021   Procedure: Coronary/Graft Acute MI Revascularization;  Surgeon: Sammy Crisp, MD;  Location: [facility];   Service: Cardiovascular;  Laterality: N/A;   EGD and esophageal diatation,  05-10-11   per Dr. Howard Macho     HERNIA REPAIR     LEFT HEART CATH AND CORONARY ANGIOGRAPHY N/A 06/06/2021   Procedure: LEFT HEART CATH AND CORONARY ANGIOGRAPHY;  Surgeon: Sammy Crisp, MD;  Location: [facility];  Service: Cardiovascular;  Laterality: N/A;   rt inguinal hernia     x2    Allergies  Allergen Reactions   Effexor [Venlafaxine]     Pt became lethargic, drowsy and not his usual self with effexor.   Contrast Media [Iodinated Contrast Media] Rash    Outpatient Encounter Medications as of 08/09/2023  Medication Sig   acetaminophen  (TYLENOL ) 325 MG tablet Take 650 mg by mouth every 6 (six) hours as needed.   acetaminophen  (TYLENOL ) 500 MG tablet Take 500 mg by mouth 2 (two) times daily.   antiseptic oral rinse (BIOTENE) LIQD 15 mLs by Mouth Rinse route as needed for dry mouth.   apixaban  (ELIQUIS ) 5 MG TABS tablet Take 1 tablet (5 mg total) by mouth 2 (two) times daily.   clotrimazole -betamethasone  (LOTRISONE ) cream Apply 1 Application topically 2 (two) times daily as needed. Fax#[phone]   docusate sodium (COLACE) 100 MG capsule Take 100 mg by mouth 2 (two) times daily.   feeding supplement (BOOST HIGH PROTEIN) LIQD Take 1 Container by mouth 2 (two) times daily between meals.   fluocinonide  cream (LIDEX ) 0.05 % Apply 1 Application topically 2 (two) times daily.   furosemide  (LASIX ) 40 MG tablet Take 1.5 pills daily for 3 days then resume 40 mg daily.   gabapentin  (NEURONTIN ) 300 MG capsule TAKE 1 CAPSULE BY MOUTH THREE TIMES A DAY   Menthol, Topical Analgesic, (BIOFREEZE ROLL-ON) 4 % GEL Apply topically. Apply to neck topically two times a day   metoprolol  succinate (TOPROL  XL) 25 MG 24 hr tablet Take 0.5 tablets (12.5 mg total) by mouth in the morning and at bedtime.   mometasone (ELOCON) 0.1 % cream Apply 1 Application topically daily.   nitroGLYCERIN  (NITROSTAT ) 0.4 MG SL tablet Place 1  tablet (0.4 mg total) under the tongue every 5 (five) minutes as needed for chest pain.   nystatin  (MYCOSTATIN /NYSTOP ) powder Apply 1 Application topically 3 (three) times daily.   pantoprazole  (PROTONIX ) 40 MG tablet Take 1 tablet (40 mg total) by mouth daily.   polyvinyl alcohol (LIQUIFILM TEARS) 1.4 % ophthalmic solution Place 2 drops into the right eye 2 (two) times daily.   potassium chloride  SA (KLOR-CON  M) 20 MEQ  tablet Take 1 tablet (20 mEq total) by mouth daily.   pramoxine (CERAVE ITCH RELIEF) 1 % LOTN Apply 1 Application topically 2 (two) times daily.   zinc oxide 20 % ointment Apply 1 Application topically daily at 12 noon. Apply to buttocks after applying Fluocinonide  Cream   atorvastatin  (LIPITOR) 40 MG tablet Take 1 tablet (40 mg total) by mouth daily. (Patient not taking: Reported on 08/09/2023)   fexofenadine  (ALLEGRA ) 180 MG tablet Take 180 mg by mouth daily. (Patient not taking: Reported on 08/09/2023)   fluticasone  (FLONASE ) 50 MCG/ACT nasal spray Place 2 sprays into both nostrils daily. (Patient not taking: Reported on 08/09/2023)   No facility-administered encounter medications on file as of 08/09/2023.    Review of Systems  Constitutional:  Negative for appetite change, fatigue and fever.  HENT:  Positive for hearing loss and trouble swallowing. Negative for congestion, postnasal drip and rhinorrhea.   Eyes:  Negative for visual disturbance.  Respiratory:  Negative for cough, shortness of breath and wheezing.   Cardiovascular:  Positive for leg swelling.  Gastrointestinal:  Negative for abdominal pain and constipation.  Genitourinary:  Negative for dysuria, frequency and urgency.       Incontinent of urine  Musculoskeletal:  Positive for arthralgias and gait problem. Negative for joint swelling.       R arm pain.   Skin:  Positive for rash.  Neurological:  Negative for speech difficulty, weakness and headaches.  Psychiatric/Behavioral:  Negative for confusion and sleep  disturbance. The patient is not nervous/anxious.     Immunization History  Administered Date(s) Administered   Fluad Quad(high Dose 65+) 02/15/2022, 02/08/2023   Influenza Split 01/20/2012, 01/07/2013   Influenza Whole 02/09/2006, 01/11/2008   Influenza, High Dose Seasonal PF 12/30/2016, 12/27/2017, 01/08/2019   Influenza,inj,quad, With Preservative 12/29/2018   Influenza-Unspecified 01/17/2014, 01/14/2016, 12/30/2016   Moderna Covid-19 Fall Seasonal Vaccine 65yrs & older 03/22/2022, 02/09/2023   PFIZER Comirnaty(Gray Top)Covid-19 Tri-Sucrose Vaccine 05/28/2019, 03/04/2020, 09/23/2020   Pfizer Covid-19 Vaccine Bivalent Booster 2yrs & up 02/25/2022   Pneumococcal Conjugate-13 09/23/2015   Pneumococcal Polysaccharide-23 03/13/2008   Td 12/20/2007   Tdap 07/06/2021   Unspecified SARS-COV-2 Vaccination 04/30/2019   Zoster Recombinant(Shingrix) 03/11/2020, 06/20/2020   Pertinent  Health Maintenance Due  Topic Date Due   INFLUENZA VACCINE  11/25/2023   FOOT EXAM  Discontinued   HEMOGLOBIN A1C  Discontinued   OPHTHALMOLOGY EXAM  Discontinued      05/04/2022    2:44 PM 06/01/2022   11:04 AM 06/15/2022    9:14 AM 06/23/2022    3:12 PM 10/19/2022   11:19 AM  Fall Risk  Falls in the past year? 1 1 1 1  Exclusion - non ambulatory  Was there an injury with Fall? 1 1 1 1    Was there an injury with Fall? - Comments    Fx Rt Ekbow. Followed by Orthopedic   Fall Risk Category Calculator 3 3 3 2    Fall Risk Category (Retired) High      (RETIRED) Patient Fall Risk Level High fall risk      Patient at Risk for Falls Due to History of fall(s);Impaired balance/gait;Impaired mobility;Impaired vision History of fall(s);Impaired balance/gait;Impaired mobility;Impaired vision History of fall(s);Impaired balance/gait;Impaired mobility Orthopedic patient   Fall risk Follow up Falls evaluation completed Falls evaluation completed Falls evaluation completed Falls prevention discussed Falls evaluation completed    Functional Status Survey:    Vitals:   08/09/23 1047  BP: (!) 148/76  Pulse: 71  Resp: 18  Temp: (!) 96.3 F (35.7 C)  SpO2: 97%  Weight: 148 lb 9.6 oz (67.4 kg)  Height: 5\' 7"  (1.702 m)   Body mass index is 23.27 kg/m. Physical Exam Vitals and nursing note reviewed.  Constitutional:      Appearance: Normal appearance.  HENT:     Head: Normocephalic.     Comments:      Nose: Nose normal. No congestion.     Mouth/Throat:     Mouth: Mucous membranes are moist.  Eyes:     Extraocular Movements: Extraocular movements intact.     Conjunctiva/sclera: Conjunctivae normal.     Pupils: Pupils are equal, round, and reactive to light.  Cardiovascular:     Rate and Rhythm: Normal rate. Rhythm irregular.     Heart sounds: No murmur heard. Pulmonary:     Effort: Pulmonary effort is normal.     Breath sounds: Rales present.     Comments: Bibasilar rales. Central congestion Abdominal:     General: Bowel sounds are normal.     Palpations: Abdomen is soft.     Tenderness: There is no abdominal tenderness.  Musculoskeletal:        General: No tenderness.     Cervical back: Normal range of motion and neck supple.     Right lower leg: Edema present.     Left lower leg: Edema present.     Comments: Right elbow in a hinged ROM brace, able to make a fist, pain at night, chronic neck pain-positional.  Swelling BLE minimal  Skin:    General: Skin is warm and dry.     Findings: Erythema and rash present.     Comments: Chronic perineal/buttocks skin irritation from moist and heat: dark reddened buttocks   Neurological:     General: No focal deficit present.     Mental Status: He is alert and oriented to person, place, and time. Mental status is at baseline.     Gait: Gait abnormal.     Comments: W/c for mobility.   Psychiatric:        Mood and Affect: Mood normal.        Behavior: Behavior normal.        Thought Content: Thought content normal.     Labs reviewed: Recent  Labs    05/10/23 0000 06/23/23 1142 07/07/23 0000  NA 131* 140 140  K 3.7 3.6 3.9  CL 98* 102 104  CO2 24* 20 27*  GLUCOSE  --  107*  --   BUN 9 15 22*  CREATININE 0.5* 0.56* 0.5*  CALCIUM  8.6* 8.6 8.9   Recent Labs    02/15/23 0000 05/10/23 0000 06/23/23 1142  AST 25 25 37  ALT 20 22 32  ALKPHOS 134* 129* 171*  BILITOT  --   --  1.0  PROT  --   --  6.0  ALBUMIN 3.5 3.3* 3.1*   Recent Labs    03/08/23 0000 05/10/23 0000 06/23/23 1142  WBC 8.1 9.0 11.0*  NEUTROABS  --  7,344.00  --   HGB 13.5 12.3* 13.2  HCT 40* 37* 40.7  MCV  --   --  89  PLT 300 249 309   Lab Results  Component Value Date   TSH 2.039 11/02/2021   Lab Results  Component Value Date   HGBA1C 5.6 11/12/2022   Lab Results  Component Value Date   CHOL 79 11/12/2022   HDL 33 (A) 11/12/2022   LDLCALC 30 11/12/2022  LDLDIRECT 72.0 09/27/2017   TRIG 80 11/12/2022   CHOLHDL 2 10/14/2021    Significant Diagnostic Results in last 30 days:  No results found.  Assessment/Plan Congestive heart failure (CHF) (HCC) Compensated clinically,  followed by Cardiology, BNP 231/7 06/23/23, continue Furosemide .   Paroxysmal atrial fibrillation (HCC) resumed Eliquis  and dc'd ASA/Plavix  03/2023 per cardiology, Hx of PE, on  Metoprolol   GERD  thing liquid, hx of dilatation,  takes Pantoprazole , Hgb 13.2 06/23/23  Hyponatremia Na 140 07/07/23  Hyperlipidemia on Atorvastatin , LDL 30 11/12/22  Essential hypertension Loose Bp control, Sbp in 150s,  on Metoprolol   Neuropathy, peripheral Lower body weakness, on Gabapentin   Dermatitis  followed by dermatology, presently: Mometasone, Fluocinonide , chronic, moist and pressure in buttocks since the patient sitting in w/c most of time.      Family/ staff Communication: plan of care reviewed with the patient and charge nurse.   Labs/tests ordered:  none

## 2023-08-09 NOTE — Assessment & Plan Note (Signed)
 thing liquid, hx of dilatation,  takes Pantoprazole, Hgb 13.2 06/23/23

## 2023-08-10 DIAGNOSIS — R1312 Dysphagia, oropharyngeal phase: Secondary | ICD-10-CM | POA: Diagnosis not present

## 2023-08-10 DIAGNOSIS — R29898 Other symptoms and signs involving the musculoskeletal system: Secondary | ICD-10-CM | POA: Diagnosis not present

## 2023-08-11 DIAGNOSIS — R29898 Other symptoms and signs involving the musculoskeletal system: Secondary | ICD-10-CM | POA: Diagnosis not present

## 2023-08-11 DIAGNOSIS — R1312 Dysphagia, oropharyngeal phase: Secondary | ICD-10-CM | POA: Diagnosis not present

## 2023-08-15 ENCOUNTER — Encounter: Payer: Self-pay | Admitting: Nurse Practitioner

## 2023-08-15 DIAGNOSIS — R1312 Dysphagia, oropharyngeal phase: Secondary | ICD-10-CM | POA: Diagnosis not present

## 2023-08-15 DIAGNOSIS — R29898 Other symptoms and signs involving the musculoskeletal system: Secondary | ICD-10-CM | POA: Diagnosis not present

## 2023-08-16 DIAGNOSIS — R29898 Other symptoms and signs involving the musculoskeletal system: Secondary | ICD-10-CM | POA: Diagnosis not present

## 2023-08-16 DIAGNOSIS — R1312 Dysphagia, oropharyngeal phase: Secondary | ICD-10-CM | POA: Diagnosis not present

## 2023-08-17 DIAGNOSIS — R29898 Other symptoms and signs involving the musculoskeletal system: Secondary | ICD-10-CM | POA: Diagnosis not present

## 2023-08-17 DIAGNOSIS — R1312 Dysphagia, oropharyngeal phase: Secondary | ICD-10-CM | POA: Diagnosis not present

## 2023-08-18 DIAGNOSIS — R1312 Dysphagia, oropharyngeal phase: Secondary | ICD-10-CM | POA: Diagnosis not present

## 2023-08-18 DIAGNOSIS — R29898 Other symptoms and signs involving the musculoskeletal system: Secondary | ICD-10-CM | POA: Diagnosis not present

## 2023-08-19 DIAGNOSIS — R29898 Other symptoms and signs involving the musculoskeletal system: Secondary | ICD-10-CM | POA: Diagnosis not present

## 2023-08-19 DIAGNOSIS — R1312 Dysphagia, oropharyngeal phase: Secondary | ICD-10-CM | POA: Diagnosis not present

## 2023-08-22 DIAGNOSIS — R29898 Other symptoms and signs involving the musculoskeletal system: Secondary | ICD-10-CM | POA: Diagnosis not present

## 2023-08-22 DIAGNOSIS — R1312 Dysphagia, oropharyngeal phase: Secondary | ICD-10-CM | POA: Diagnosis not present

## 2023-08-24 DIAGNOSIS — R29898 Other symptoms and signs involving the musculoskeletal system: Secondary | ICD-10-CM | POA: Diagnosis not present

## 2023-08-24 DIAGNOSIS — R1312 Dysphagia, oropharyngeal phase: Secondary | ICD-10-CM | POA: Diagnosis not present

## 2023-08-25 ENCOUNTER — Encounter: Payer: Self-pay | Admitting: Nurse Practitioner

## 2023-08-25 ENCOUNTER — Non-Acute Institutional Stay (SKILLED_NURSING_FACILITY): Payer: Self-pay | Admitting: Nurse Practitioner

## 2023-08-25 DIAGNOSIS — I10 Essential (primary) hypertension: Secondary | ICD-10-CM

## 2023-08-25 DIAGNOSIS — R29898 Other symptoms and signs involving the musculoskeletal system: Secondary | ICD-10-CM | POA: Diagnosis not present

## 2023-08-25 DIAGNOSIS — J849 Interstitial pulmonary disease, unspecified: Secondary | ICD-10-CM | POA: Diagnosis not present

## 2023-08-25 DIAGNOSIS — R1310 Dysphagia, unspecified: Secondary | ICD-10-CM

## 2023-08-25 DIAGNOSIS — R059 Cough, unspecified: Secondary | ICD-10-CM | POA: Insufficient documentation

## 2023-08-25 DIAGNOSIS — J90 Pleural effusion, not elsewhere classified: Secondary | ICD-10-CM | POA: Diagnosis not present

## 2023-08-25 DIAGNOSIS — I5043 Acute on chronic combined systolic (congestive) and diastolic (congestive) heart failure: Secondary | ICD-10-CM | POA: Diagnosis not present

## 2023-08-25 DIAGNOSIS — F419 Anxiety disorder, unspecified: Secondary | ICD-10-CM

## 2023-08-25 DIAGNOSIS — R052 Subacute cough: Secondary | ICD-10-CM | POA: Diagnosis not present

## 2023-08-25 DIAGNOSIS — K219 Gastro-esophageal reflux disease without esophagitis: Secondary | ICD-10-CM | POA: Diagnosis not present

## 2023-08-25 DIAGNOSIS — I480 Paroxysmal atrial fibrillation: Secondary | ICD-10-CM | POA: Diagnosis not present

## 2023-08-25 DIAGNOSIS — R1312 Dysphagia, oropharyngeal phase: Secondary | ICD-10-CM | POA: Diagnosis not present

## 2023-08-25 NOTE — Assessment & Plan Note (Signed)
Blood pressure is controlled, on Metoprolol

## 2023-08-25 NOTE — Assessment & Plan Note (Signed)
 choked in the past, chopped meat with thin fluid presently. Underwent GI evaluation. CXR normal 03/07/23

## 2023-08-25 NOTE — Progress Notes (Signed)
 Location:  [facility] Room Number: N065-A Place of Service:  SNF [phone]) Provider:  Carmen Chol    Patient Care Team: Donley Furth, MD as PCP - General (Family Medicine) Wendie Hamburg, MD as PCP - Cardiology (Cardiology) Maris Sickle, MD as Consulting Physician (Ophthalmology) Seward Dao Alleen Arbour, MD as Consulting Physician (Ophthalmology) Duke, Warren Haber, PA as Physician Assistant (Cardiology)  Extended Emergency Contact Information Primary Emergency Contact: Garrett,Cathy  [place] Home Phone: [phone] Mobile Phone: [phone] Relation: Daughter Secondary Emergency Contact: Shepard,Susan  [place] Home Phone: [phone] Relation: Daughter  Code Status:  Full Goals of care: Advanced Directive information    08/26/2023    2:19 PM  Advanced Directives  Does Patient Have a Medical Advance Directive? Yes  Type of Estate agent of [place];Living will  Does patient want to make changes to medical advance directive? No - Patient declined  Copy of Healthcare Power of Attorney in Chart? Yes - validated most recent copy scanned in chart (See row information)     Chief Complaint  Patient presents with   Acute Visit    Cough    HPI:  Pt is a 89 y.o. male seen today for an acute visit for c/o changing of the sound of cough and central congestion since the pill got stuck in his esophagus x1 last week.  Denied chest pain, SOB, GERD symptoms, fever. No noted O2 desaturation.   Chronic dermatitis buttocks, followed by dermatology, presently: Mometasone, Fluocinonide , Nystatin  Dysphagia, choked in the past, chopped meat with thin fluid presently. Underwent GI evaluation. CXR normal 03/07/23             Anxiety, failed effexor, stated sleeps well at night, but persisted irritable at times, agreed to Lexapro 5mg  every day 08/25/23             The right arm pain, more at night, Tylenol  at night  helped                03/24/23 right elbow fx(distal end of right humerus), healed with deformity and limited ROM, has been to surgeon-non surgical candidate.             CAD ST elevation MI LAD 05/2021, underwent Cath, stenting, s/p percutaneous coronary angioplasty, started DAPT, Echo EF 40-45%,  65-70% 11/02/21, started Plavix  12/24/21 per [facility]             PE 11/02/21, had CTA 05/12/22, 09/08/22 Cardiology dc Eliquis , then resumed 03/2023 and off ASA and Plavix  03/2023 in setting of Afib.  Repeated CT @ 6 mos,  negative PE             Grade 2 diastolic dysfunction, swelling BLE, SOB, placed on Furosemide  in setting of weight gained, improved, followed by Cardiology, BNP 231/7 06/23/23             Afib, resumed Eliquis  and dc'd ASA/Plavix  03/2023 per cardiology, on  Metoprolol , EKG 08/08/23 Afib with slow HR 50s.              GERD, thing liquid, hx of dilatation,  takes Pantoprazole , Hgb 13.2 06/23/23             Hypokalemia, on Kcl K 3.9 07/07/23             Hyponatremia, Na 140 07/07/23             HLD on Atorvastatin , LDL 30 11/12/22  HTN, on Metoprolol              Peripheral neuropathy, on Gabapentin              Cognitive impairment: MMSE 19/30 05/20/23, SN[facility] for care needs.      Past Medical History:  Diagnosis Date   Arthritis    Diverticulosis of colon    External thrombosed hemorrhoids 06/11/2009   Qualifier: Diagnosis of  By: Alyne Babinski MD, Lenis Quin    GERD (gastroesophageal reflux disease)    Hypertension    Past Surgical History:  Procedure Laterality Date   cataract extaction, right  6/09   per Dr. Norberta Beans   CATARACT EXTRACTION Left    COLONOSCOPY  09/23/04   per Dr. Toribio Frees, clear, no repeats needed    CORONARY STENT INTERVENTION N/A 06/06/2021   Procedure: CORONARY STENT INTERVENTION;  Surgeon: Sammy Crisp, MD;  Location: [facility];  Service: Cardiovascular;  Laterality: N/A;   CORONARY/GRAFT ACUTE MI REVASCULARIZATION N/A 06/06/2021   Procedure:  Coronary/Graft Acute MI Revascularization;  Surgeon: Sammy Crisp, MD;  Location: [facility];  Service: Cardiovascular;  Laterality: N/A;   EGD and esophageal diatation,  05-10-11   per Dr. Howard Macho     HERNIA REPAIR     LEFT HEART CATH AND CORONARY ANGIOGRAPHY N/A 06/06/2021   Procedure: LEFT HEART CATH AND CORONARY ANGIOGRAPHY;  Surgeon: Sammy Crisp, MD;  Location: [facility];  Service: Cardiovascular;  Laterality: N/A;   rt inguinal hernia     x2    Allergies  Allergen Reactions   Effexor [Venlafaxine]     Pt became lethargic, drowsy and not his usual self with effexor.   Contrast Media [Iodinated Contrast Media] Rash    Outpatient Encounter Medications as of 08/25/2023  Medication Sig   acetaminophen  (TYLENOL ) 325 MG tablet Take 650 mg by mouth every 6 (six) hours as needed.   acetaminophen  (TYLENOL ) 500 MG tablet Take 500 mg by mouth 2 (two) times daily.   antiseptic oral rinse (BIOTENE) LIQD 15 mLs by Mouth Rinse route as needed for dry mouth.   apixaban  (ELIQUIS ) 5 MG TABS tablet Take 1 tablet (5 mg total) by mouth 2 (two) times daily.   clotrimazole -betamethasone  (LOTRISONE ) cream Apply 1 Application topically 2 (two) times daily as needed. Fax#[phone]   docusate sodium (COLACE) 100 MG capsule Take 100 mg by mouth 2 (two) times daily.   feeding supplement (BOOST HIGH PROTEIN) LIQD Take 1 Container by mouth 2 (two) times daily between meals.   fluocinonide  cream (LIDEX ) 0.05 % Apply 1 Application topically 2 (two) times daily.   furosemide  (LASIX ) 40 MG tablet Take 1.5 pills daily for 3 days then resume 40 mg daily.   gabapentin  (NEURONTIN ) 300 MG capsule TAKE 1 CAPSULE BY MOUTH THREE TIMES A DAY   guaiFENesin  (MUCINEX ) 600 MG 12 hr tablet Take 1,200 mg by mouth 2 (two) times daily.   Menthol, Topical Analgesic, (BIOFREEZE ROLL-ON) 4 % GEL Apply topically. Apply to neck topically two times a day   metoprolol  succinate (TOPROL  XL) 25 MG 24 hr tablet Take  0.5 tablets (12.5 mg total) by mouth in the morning and at bedtime.   mometasone (ELOCON) 0.1 % cream Apply 1 Application topically daily.   nitroGLYCERIN  (NITROSTAT ) 0.4 MG SL tablet Place 1 tablet (0.4 mg total) under the tongue every 5 (five) minutes as needed for chest pain.   nystatin  (MYCOSTATIN /NYSTOP ) powder Apply 1 Application topically 3 (three) times daily.  pantoprazole  (PROTONIX ) 40 MG tablet Take 1 tablet (40 mg total) by mouth daily.   polyvinyl alcohol (LIQUIFILM TEARS) 1.4 % ophthalmic solution Place 2 drops into the right eye 2 (two) times daily.   potassium chloride  SA (KLOR-CON  M) 20 MEQ tablet Take 1 tablet (20 mEq total) by mouth daily.   pramoxine (CERAVE ITCH RELIEF) 1 % LOTN Apply 1 Application topically 2 (two) times daily.   zinc oxide 20 % ointment Apply 1 Application topically daily at 12 noon. Apply to buttocks after applying Fluocinonide  Cream   atorvastatin  (LIPITOR) 40 MG tablet Take 1 tablet (40 mg total) by mouth daily. (Patient not taking: Reported on 08/09/2023)   fexofenadine  (ALLEGRA ) 180 MG tablet Take 180 mg by mouth daily. (Patient not taking: Reported on 08/09/2023)   fluticasone  (FLONASE ) 50 MCG/ACT nasal spray Place 2 sprays into both nostrils daily. (Patient not taking: Reported on 08/09/2023)   No facility-administered encounter medications on file as of 08/25/2023.    Review of Systems  Constitutional:  Negative for appetite change, fatigue and fever.  HENT:  Positive for hearing loss and trouble swallowing. Negative for congestion, postnasal drip and rhinorrhea.   Eyes:  Negative for visual disturbance.  Respiratory:  Positive for cough. Negative for chest tightness, shortness of breath and wheezing.   Cardiovascular:  Positive for leg swelling.  Gastrointestinal:  Negative for abdominal pain and constipation.  Genitourinary:  Negative for dysuria, frequency and urgency.       Incontinent of urine  Musculoskeletal:  Positive for arthralgias and  gait problem. Negative for joint swelling.       R arm pain.   Skin:  Positive for rash.  Neurological:  Negative for speech difficulty, weakness and headaches.  Psychiatric/Behavioral:  Negative for confusion and sleep disturbance. The patient is not nervous/anxious.     Immunization History  Administered Date(s) Administered   Fluad Quad(high Dose 65+) 02/15/2022, 02/08/2023   Influenza Split 01/20/2012, 01/07/2013   Influenza Whole 02/09/2006, 01/11/2008   Influenza, High Dose Seasonal PF 12/30/2016, 12/27/2017, 01/08/2019   Influenza,inj,quad, With Preservative 12/29/2018   Influenza-Unspecified 01/17/2014, 01/14/2016, 12/30/2016   Moderna Covid-19 Fall Seasonal Vaccine 28yrs & older 03/22/2022, 02/09/2023   PFIZER Comirnaty(Gray Top)Covid-19 Tri-Sucrose Vaccine 05/28/2019, 03/04/2020, 09/23/2020   Pfizer Covid-19 Vaccine Bivalent Booster 41yrs & up 02/25/2022   Pneumococcal Conjugate-13 09/23/2015   Pneumococcal Polysaccharide-23 03/13/2008   Td 12/20/2007   Tdap 07/06/2021   Unspecified SARS-COV-2 Vaccination 04/30/2019   Zoster Recombinant(Shingrix) 03/11/2020, 06/20/2020   Pertinent  Health Maintenance Due  Topic Date Due   INFLUENZA VACCINE  11/25/2023   FOOT EXAM  Discontinued   HEMOGLOBIN A1C  Discontinued   OPHTHALMOLOGY EXAM  Discontinued      05/04/2022    2:44 PM 06/01/2022   11:04 AM 06/15/2022    9:14 AM 06/23/2022    3:12 PM 10/19/2022   11:19 AM  Fall Risk  Falls in the past year? 1 1 1 1  Exclusion - non ambulatory  Was there an injury with Fall? 1 1 1 1    Was there an injury with Fall? - Comments    Fx Rt Ekbow. Followed by Orthopedic   Fall Risk Category Calculator 3 3 3 2    Fall Risk Category (Retired) High      (RETIRED) Patient Fall Risk Level High fall risk      Patient at Risk for Falls Due to History of fall(s);Impaired balance/gait;Impaired mobility;Impaired vision History of fall(s);Impaired balance/gait;Impaired mobility;Impaired vision History of  fall(s);Impaired balance/gait;Impaired  mobility Orthopedic patient   Fall risk Follow up Falls evaluation completed Falls evaluation completed Falls evaluation completed Falls prevention discussed Falls evaluation completed   Functional Status Survey:    Vitals:   08/25/23 1122  BP: 130/86  Pulse: 86  Resp: 18  Temp: (!) 96.3 F (35.7 C)  SpO2: 97%  Weight: 148 lb 9.6 oz (67.4 kg)  Height: 5\' 7"  (1.702 m)   Body mass index is 23.27 kg/m. Physical Exam Vitals and nursing note reviewed.  Constitutional:      Appearance: Normal appearance.  HENT:     Head: Normocephalic.     Comments:      Nose: Nose normal. No congestion.     Mouth/Throat:     Mouth: Mucous membranes are moist.  Eyes:     Extraocular Movements: Extraocular movements intact.     Conjunctiva/sclera: Conjunctivae normal.     Pupils: Pupils are equal, round, and reactive to light.  Cardiovascular:     Rate and Rhythm: Normal rate. Rhythm irregular.     Heart sounds: No murmur heard. Pulmonary:     Effort: Pulmonary effort is normal.     Breath sounds: Rales present. No wheezing or rhonchi.     Comments: Bibasilar rales. Central congestion Abdominal:     General: Bowel sounds are normal.     Palpations: Abdomen is soft.     Tenderness: There is no abdominal tenderness.  Musculoskeletal:        General: No tenderness.     Cervical back: Normal range of motion and neck supple.     Right lower leg: Edema present.     Left lower leg: Edema present.     Comments: Right elbow deformity and decreased ROM Swelling BLE minimal  Skin:    General: Skin is warm and dry.     Findings: Erythema and rash present.     Comments: Chronic perineal/buttocks skin irritation from moist and heat: dark reddened buttocks   Neurological:     General: No focal deficit present.     Mental Status: He is alert and oriented to person, place, and time. Mental status is at baseline.     Gait: Gait abnormal.     Comments: W/c for  mobility.   Psychiatric:        Mood and Affect: Mood normal.        Behavior: Behavior normal.        Thought Content: Thought content normal.     Labs reviewed: Recent Labs    05/10/23 0000 06/23/23 1142 07/07/23 0000  NA 131* 140 140  K 3.7 3.6 3.9  CL 98* 102 104  CO2 24* 20 27*  GLUCOSE  --  107*  --   BUN 9 15 22*  CREATININE 0.5* 0.56* 0.5*  CALCIUM  8.6* 8.6 8.9   Recent Labs    02/15/23 0000 05/10/23 0000 06/23/23 1142  AST 25 25 37  ALT 20 22 32  ALKPHOS 134* 129* 171*  BILITOT  --   --  1.0  PROT  --   --  6.0  ALBUMIN 3.5 3.3* 3.1*   Recent Labs    03/08/23 0000 05/10/23 0000 06/23/23 1142  WBC 8.1 9.0 11.0*  NEUTROABS  --  7,344.00  --   HGB 13.5 12.3* 13.2  HCT 40* 37* 40.7  MCV  --   --  89  PLT 300 249 309   Lab Results  Component Value Date   TSH 2.039 11/02/2021  Lab Results  Component Value Date   HGBA1C 5.6 11/12/2022   Lab Results  Component Value Date   CHOL 79 11/12/2022   HDL 33 (A) 11/12/2022   LDLCALC 30 11/12/2022   LDLDIRECT 72.0 09/27/2017   TRIG 80 11/12/2022   CHOLHDL 2 10/14/2021    Significant Diagnostic Results in last 30 days:  No results found.  Assessment/Plan Cough c/o changing of the sound of cough and central congestion since the pill got stuck in his esophagus x1 last week.  Denied chest pain, SOB, GERD symptoms, fever. No noted O2 desaturation.  CXR ap/lateral to evaluate further.   Dysphagia choked in the past, chopped meat with thin fluid presently. Underwent GI evaluation. CXR normal 03/07/23  Congestive heart failure (CHF) (HCC) Compensated, followed by Cardiology, on Furosemide .   Paroxysmal atrial fibrillation (HCC) resumed Eliquis  and dc'd ASA/Plavix  03/2023 per cardiology, on  Metoprolol , EKG 08/08/23 Afib with slow HR 50s.   GERD thing liquid, hx of dilatation,  takes Pantoprazole , Hgb 13.2 06/23/23  Essential hypertension Blood pressure is controlled, on Metoprolol   Anxiety   failed effexor, stated sleeps well at night, but persisted irritable at times, agreed to Lexapro 5mg  every day 08/25/23     Family/ staff Communication: plan of care reviewed with the patient and charge nurse.   Labs/tests ordered:  CXR ap/lateral.

## 2023-08-25 NOTE — Assessment & Plan Note (Signed)
 c/o changing of the sound of cough and central congestion since the pill got stuck in his esophagus x1 last week.  Denied chest pain, SOB, GERD symptoms, fever. No noted O2 desaturation.  CXR ap/lateral to evaluate further.

## 2023-08-25 NOTE — Assessment & Plan Note (Signed)
 failed effexor, stated sleeps well at night, but persisted irritable at times, agreed to Lexapro 5mg  every day 08/25/23

## 2023-08-25 NOTE — Assessment & Plan Note (Signed)
 thing liquid, hx of dilatation,  takes Pantoprazole, Hgb 13.2 06/23/23

## 2023-08-25 NOTE — Assessment & Plan Note (Signed)
 Compensated, followed by Cardiology, on Furosemide .

## 2023-08-25 NOTE — Assessment & Plan Note (Signed)
 resumed Eliquis  and dc'd ASA/Plavix  03/2023 per cardiology, on  Metoprolol , EKG 08/08/23 Afib with slow HR 50s.

## 2023-08-26 ENCOUNTER — Encounter: Payer: Self-pay | Admitting: Sports Medicine

## 2023-08-26 ENCOUNTER — Non-Acute Institutional Stay (SKILLED_NURSING_FACILITY): Payer: Self-pay | Admitting: Sports Medicine

## 2023-08-26 DIAGNOSIS — F411 Generalized anxiety disorder: Secondary | ICD-10-CM

## 2023-08-26 DIAGNOSIS — R059 Cough, unspecified: Secondary | ICD-10-CM

## 2023-08-26 DIAGNOSIS — I509 Heart failure, unspecified: Secondary | ICD-10-CM

## 2023-08-26 NOTE — Progress Notes (Unsigned)
 Location:  [facility] Room Number: N065-A Place of Service:  SNF [phone]) Provider:  Tye Gall, MD    Patient Care Team: Donley Furth, MD as PCP - General (Family Medicine) Wendie Hamburg, MD as PCP - Cardiology (Cardiology) Maris Sickle, MD as Consulting Physician (Ophthalmology) Seward Dao Alleen Arbour, MD as Consulting Physician (Ophthalmology) Duke, Warren Haber, PA as Physician Assistant (Cardiology)  Extended Emergency Contact Information Primary Emergency Contact: Garrett,Cathy  [place] Home Phone: [phone] Mobile Phone: [phone] Relation: Daughter Secondary Emergency Contact: Gwenyth Leo  [place] Home Phone: [phone] Relation: Daughter  Code Status:  Full Code Goals of care: Advanced Directive information    08/26/2023    2:19 PM  Advanced Directives  Does Patient Have a Medical Advance Directive? Yes  Type of Estate agent of [place];Living will  Does patient want to make changes to medical advance directive? No - Patient declined  Copy of Healthcare Power of Attorney in Chart? Yes - validated most recent copy scanned in chart (See row information)     Chief Complaint  Patient presents with   Congestive Heart Failure    Congestive heart failure    HPI:  Pt is a 89 y.o. male seen today for an acute visit for  follow up on chest x ray  Pt seen and  examined in her room  Pt c/o chronic cough with congestion  Has exertional SOB , but no recent change  Pt is laying on his recliner chair watching TV  C/o post nasal drip  Chest x ray    08/25/2023 13:02 146.6 Lbs (Wheelchair) -7.5% change [ Comparison Weight 05/18/2023, 165.6 lbs, -10.3%, -17.0 lbs] 08/16/2023 12:59 148.6 Lbs (Sitting) -7.5% change [ Comparison Weight 05/11/2023, 174.8 lbs, -15%, -26.2 lbs] 07/28/2023 10:33 148.6 Lbs (Sitting) -7.5% change [ Comparison Weight 04/28/2023, 176.3  lbs, -16.2%, -28.5 lbs] 07/26/2023 14:30 147.8 Lbs (Wheelchair) -7.5% change [ Comparison Weight 04/28/2023, 176.3 lbs  His weight stable   BASIC METABOLIC PANEL GLUCOSE 102 mg/dL 56-43 H Final  Fasting reference interval For someone without known diabetes, a glucose value between 100 and 125 mg/dL is consistent with prediabetes and should be confirmed with a follow-up test. UREA NITROGEN (BUN) 22 mg/dL 3-29 Final CREATININE 5.18 mg/dL 8.41-6.60 L Final EGFR 96 mL/min/1.73 m2 > OR = 60 Final BUN/CREATININE RATIO 49 (calc) 6-22 H Final SODIUM 140 mmol/L 135-146 Final POTASSIUM 3.9 mmol/L 3.5-5.3 Final CHLORIDE 104 mmol/L 98-110 Final CARBON DIOXIDE 27 mmol/L 20-32 Final CALCIUM  8.9 mg/dL 6.3-01.6 Final  Past Medical History:  Diagnosis Date   Arthritis    Diverticulosis of colon    External thrombosed hemorrhoids 06/11/2009   Qualifier: Diagnosis of  By: Alyne Babinski MD, Lenis Quin    GERD (gastroesophageal reflux disease)    Hypertension    Past Surgical History:  Procedure Laterality Date   cataract extaction, right  6/09   per Dr. Norberta Beans   CATARACT EXTRACTION Left    COLONOSCOPY  09/23/04   per Dr. Toribio Frees, clear, no repeats needed    CORONARY STENT INTERVENTION N/A 06/06/2021   Procedure: CORONARY STENT INTERVENTION;  Surgeon: Sammy Crisp, MD;  Location: [facility];  Service: Cardiovascular;  Laterality: N/A;   CORONARY/GRAFT ACUTE MI REVASCULARIZATION N/A 06/06/2021   Procedure: Coronary/Graft Acute MI Revascularization;  Surgeon: Sammy Crisp, MD;  Location: [facility];  Service: Cardiovascular;  Laterality: N/A;   EGD and esophageal diatation,  05-10-11   per Dr.  Jacobs     HERNIA REPAIR     LEFT HEART CATH AND CORONARY ANGIOGRAPHY N/A 06/06/2021   Procedure: LEFT HEART CATH AND CORONARY ANGIOGRAPHY;  Surgeon: Sammy Crisp, MD;  Location: [facility];  Service: Cardiovascular;  Laterality: N/A;   rt inguinal hernia     x2    Allergies   Allergen Reactions   Effexor [Venlafaxine]     Pt became lethargic, drowsy and not his usual self with effexor.   Contrast Media [Iodinated Contrast Media] Rash    Outpatient Encounter Medications as of 08/26/2023  Medication Sig   acetaminophen  (TYLENOL ) 325 MG tablet Take 650 mg by mouth every 6 (six) hours as needed.   acetaminophen  (TYLENOL ) 500 MG tablet Take 500 mg by mouth 2 (two) times daily.   antiseptic oral rinse (BIOTENE) LIQD 15 mLs by Mouth Rinse route as needed for dry mouth.   apixaban  (ELIQUIS ) 5 MG TABS tablet Take 1 tablet (5 mg total) by mouth 2 (two) times daily.   clotrimazole -betamethasone  (LOTRISONE ) cream Apply 1 Application topically 2 (two) times daily as needed. Fax#[phone]   docusate sodium (COLACE) 100 MG capsule Take 100 mg by mouth 2 (two) times daily.   escitalopram (LEXAPRO) 5 MG tablet Take 5 mg by mouth daily.   feeding supplement (BOOST HIGH PROTEIN) LIQD Take 1 Container by mouth 2 (two) times daily between meals.   fluocinonide  cream (LIDEX ) 0.05 % Apply 1 Application topically 2 (two) times daily.   furosemide  (LASIX ) 40 MG tablet Take 1.5 pills daily for 3 days then resume 40 mg daily.   gabapentin  (NEURONTIN ) 300 MG capsule TAKE 1 CAPSULE BY MOUTH THREE TIMES A DAY   guaiFENesin  (MUCINEX ) 600 MG 12 hr tablet Take 1,200 mg by mouth 2 (two) times daily.   Menthol, Topical Analgesic, (BIOFREEZE ROLL-ON) 4 % GEL Apply topically. Apply to neck topically two times a day   metoprolol  succinate (TOPROL  XL) 25 MG 24 hr tablet Take 0.5 tablets (12.5 mg total) by mouth in the morning and at bedtime.   mometasone (ELOCON) 0.1 % cream Apply 1 Application topically daily.   nitroGLYCERIN  (NITROSTAT ) 0.4 MG SL tablet Place 1 tablet (0.4 mg total) under the tongue every 5 (five) minutes as needed for chest pain.   nystatin  (MYCOSTATIN /NYSTOP ) powder Apply 1 Application topically 3 (three) times daily.   pantoprazole  (PROTONIX ) 40 MG tablet Take 1 tablet (40 mg  total) by mouth daily.   polyvinyl alcohol (LIQUIFILM TEARS) 1.4 % ophthalmic solution Place 2 drops into the right eye 2 (two) times daily.   potassium chloride  SA (KLOR-CON  M) 20 MEQ tablet Take 1 tablet (20 mEq total) by mouth daily.   pramoxine (CERAVE ITCH RELIEF) 1 % LOTN Apply 1 Application topically 2 (two) times daily.   zinc oxide 20 % ointment Apply 1 Application topically daily at 12 noon. Apply to buttocks after applying Fluocinonide  Cream   atorvastatin  (LIPITOR) 40 MG tablet Take 1 tablet (40 mg total) by mouth daily. (Patient not taking: Reported on 08/09/2023)   fexofenadine  (ALLEGRA ) 180 MG tablet Take 180 mg by mouth daily. (Patient not taking: Reported on 08/09/2023)   fluticasone  (FLONASE ) 50 MCG/ACT nasal spray Place 2 sprays into both nostrils daily. (Patient not taking: Reported on 08/09/2023)   No facility-administered encounter medications on file as of 08/26/2023.    Review of Systems  Constitutional:  Negative for fever.  Respiratory:  Positive for cough and shortness of breath (exertional). Negative for wheezing.  Cardiovascular:  Negative for chest pain, palpitations and leg swelling.  Gastrointestinal:  Negative for abdominal distention, abdominal pain, blood in stool, constipation, diarrhea, nausea and vomiting.  Genitourinary:  Negative for dysuria.  Neurological:  Negative for dizziness.  Psychiatric/Behavioral:  Negative for confusion.     Immunization History  Administered Date(s) Administered   Fluad Quad(high Dose 65+) 02/15/2022, 02/08/2023   Influenza Split 01/20/2012, 01/07/2013   Influenza Whole 02/09/2006, 01/11/2008   Influenza, High Dose Seasonal PF 12/30/2016, 12/27/2017, 01/08/2019   Influenza,inj,quad, With Preservative 12/29/2018   Influenza-Unspecified 01/17/2014, 01/14/2016, 12/30/2016   Moderna Covid-19 Fall Seasonal Vaccine 7yrs & older 03/22/2022, 02/09/2023   PFIZER Comirnaty(Gray Top)Covid-19 Tri-Sucrose Vaccine 05/28/2019,  03/04/2020, 09/23/2020   Pfizer Covid-19 Vaccine Bivalent Booster 28yrs & up 02/25/2022   Pneumococcal Conjugate-13 09/23/2015   Pneumococcal Polysaccharide-23 03/13/2008   Td 12/20/2007   Tdap 07/06/2021   Unspecified SARS-COV-2 Vaccination 04/30/2019   Zoster Recombinant(Shingrix) 03/11/2020, 06/20/2020   Pertinent  Health Maintenance Due  Topic Date Due   INFLUENZA VACCINE  11/25/2023   FOOT EXAM  Discontinued   HEMOGLOBIN A1C  Discontinued   OPHTHALMOLOGY EXAM  Discontinued      05/04/2022    2:44 PM 06/01/2022   11:04 AM 06/15/2022    9:14 AM 06/23/2022    3:12 PM 10/19/2022   11:19 AM  Fall Risk  Falls in the past year? 1 1 1 1  Exclusion - non ambulatory  Was there an injury with Fall? 1 1 1 1    Was there an injury with Fall? - Comments    Fx Rt Ekbow. Followed by Orthopedic   Fall Risk Category Calculator 3 3 3 2    Fall Risk Category (Retired) High      (RETIRED) Patient Fall Risk Level High fall risk      Patient at Risk for Falls Due to History of fall(s);Impaired balance/gait;Impaired mobility;Impaired vision History of fall(s);Impaired balance/gait;Impaired mobility;Impaired vision History of fall(s);Impaired balance/gait;Impaired mobility Orthopedic patient   Fall risk Follow up Falls evaluation completed Falls evaluation completed Falls evaluation completed Falls prevention discussed Falls evaluation completed   Functional Status Survey:    Vitals:   08/26/23 1412  BP: 128/83  Pulse: 74  Resp: (!) 22  Temp: 97.6 F (36.4 C)  SpO2: 95%  Weight: 146 lb 9.6 oz (66.5 kg)  Height: 5\' 7"  (1.702 m)   Body mass index is 22.96 kg/m. Physical Exam Constitutional:      Appearance: Normal appearance.  HENT:     Head: Normocephalic and atraumatic.  Cardiovascular:     Rate and Rhythm: Normal rate and regular rhythm.     Pulses: Normal pulses.     Heart sounds: Normal heart sounds.  Pulmonary:     Effort: No respiratory distress.     Breath sounds: No stridor. No  wheezing or rales.  Abdominal:     General: Bowel sounds are normal. There is no distension.     Palpations: Abdomen is soft.     Tenderness: There is no abdominal tenderness. There is no guarding.  Musculoskeletal:        General: Swelling present.     Comments: improving  Neurological:     Mental Status: He is alert. Mental status is at baseline.     Labs reviewed: Recent Labs    05/10/23 0000 06/23/23 1142 07/07/23 0000  NA 131* 140 140  K 3.7 3.6 3.9  CL 98* 102 104  CO2 24* 20 27*  GLUCOSE  --  107*  --  BUN 9 15 22*  CREATININE 0.5* 0.56* 0.5*  CALCIUM  8.6* 8.6 8.9   Recent Labs    02/15/23 0000 05/10/23 0000 06/23/23 1142  AST 25 25 37  ALT 20 22 32  ALKPHOS 134* 129* 171*  BILITOT  --   --  1.0  PROT  --   --  6.0  ALBUMIN 3.5 3.3* 3.1*   Recent Labs    03/08/23 0000 05/10/23 0000 06/23/23 1142  WBC 8.1 9.0 11.0*  NEUTROABS  --  7,344.00  --   HGB 13.5 12.3* 13.2  HCT 40* 37* 40.7  MCV  --   --  89  PLT 300 249 309   Lab Results  Component Value Date   TSH 2.039 11/02/2021   Lab Results  Component Value Date   HGBA1C 5.6 11/12/2022   Lab Results  Component Value Date   CHOL 79 11/12/2022   HDL 33 (A) 11/12/2022   LDLCALC 30 11/12/2022   LDLDIRECT 72.0 09/27/2017   TRIG 80 11/12/2022   CHOLHDL 2 10/14/2021    Significant Diagnostic Results in last 30 days:  No results found.  Assessment/Plan CHF  Pt has exertional SOB but reports no new change Weight stable Lungs clear to auscultation  Lower extremity swelling improved Cont with lasix   Avoid salty foods  Chronic cough  C/o post nasal drip  He is currently on mucinex  Will start flonase     GAD Started on lexpro recently  Will monitor    Family/ staff Communication: ***  Labs/tests ordered:  ***

## 2023-08-29 ENCOUNTER — Encounter: Payer: Self-pay | Admitting: Sports Medicine

## 2023-08-29 DIAGNOSIS — R29898 Other symptoms and signs involving the musculoskeletal system: Secondary | ICD-10-CM | POA: Diagnosis not present

## 2023-08-29 DIAGNOSIS — R1312 Dysphagia, oropharyngeal phase: Secondary | ICD-10-CM | POA: Diagnosis not present

## 2023-08-31 DIAGNOSIS — R1312 Dysphagia, oropharyngeal phase: Secondary | ICD-10-CM | POA: Diagnosis not present

## 2023-08-31 DIAGNOSIS — R29898 Other symptoms and signs involving the musculoskeletal system: Secondary | ICD-10-CM | POA: Diagnosis not present

## 2023-09-01 ENCOUNTER — Ambulatory Visit: Payer: PRIVATE HEALTH INSURANCE | Admitting: Dermatology

## 2023-09-01 DIAGNOSIS — R1312 Dysphagia, oropharyngeal phase: Secondary | ICD-10-CM | POA: Diagnosis not present

## 2023-09-01 DIAGNOSIS — R29898 Other symptoms and signs involving the musculoskeletal system: Secondary | ICD-10-CM | POA: Diagnosis not present

## 2023-09-05 ENCOUNTER — Encounter: Payer: Self-pay | Admitting: Nurse Practitioner

## 2023-09-07 DIAGNOSIS — R29898 Other symptoms and signs involving the musculoskeletal system: Secondary | ICD-10-CM | POA: Diagnosis not present

## 2023-09-07 DIAGNOSIS — R1312 Dysphagia, oropharyngeal phase: Secondary | ICD-10-CM | POA: Diagnosis not present

## 2023-09-09 ENCOUNTER — Encounter: Payer: Self-pay | Admitting: Nurse Practitioner

## 2023-09-09 ENCOUNTER — Non-Acute Institutional Stay (SKILLED_NURSING_FACILITY): Payer: Self-pay | Admitting: Nurse Practitioner

## 2023-09-09 DIAGNOSIS — K219 Gastro-esophageal reflux disease without esophagitis: Secondary | ICD-10-CM | POA: Diagnosis not present

## 2023-09-09 DIAGNOSIS — E785 Hyperlipidemia, unspecified: Secondary | ICD-10-CM | POA: Diagnosis not present

## 2023-09-09 DIAGNOSIS — F419 Anxiety disorder, unspecified: Secondary | ICD-10-CM | POA: Diagnosis not present

## 2023-09-09 DIAGNOSIS — Z9861 Coronary angioplasty status: Secondary | ICD-10-CM

## 2023-09-09 DIAGNOSIS — I2510 Atherosclerotic heart disease of native coronary artery without angina pectoris: Secondary | ICD-10-CM | POA: Diagnosis not present

## 2023-09-09 DIAGNOSIS — L309 Dermatitis, unspecified: Secondary | ICD-10-CM

## 2023-09-09 DIAGNOSIS — I10 Essential (primary) hypertension: Secondary | ICD-10-CM | POA: Diagnosis not present

## 2023-09-09 DIAGNOSIS — E871 Hypo-osmolality and hyponatremia: Secondary | ICD-10-CM

## 2023-09-09 DIAGNOSIS — R1310 Dysphagia, unspecified: Secondary | ICD-10-CM

## 2023-09-09 DIAGNOSIS — I480 Paroxysmal atrial fibrillation: Secondary | ICD-10-CM

## 2023-09-09 DIAGNOSIS — R4189 Other symptoms and signs involving cognitive functions and awareness: Secondary | ICD-10-CM

## 2023-09-09 DIAGNOSIS — E876 Hypokalemia: Secondary | ICD-10-CM | POA: Diagnosis not present

## 2023-09-09 DIAGNOSIS — I5043 Acute on chronic combined systolic (congestive) and diastolic (congestive) heart failure: Secondary | ICD-10-CM | POA: Diagnosis not present

## 2023-09-09 DIAGNOSIS — G609 Hereditary and idiopathic neuropathy, unspecified: Secondary | ICD-10-CM

## 2023-09-09 DIAGNOSIS — Z86711 Personal history of pulmonary embolism: Secondary | ICD-10-CM | POA: Diagnosis not present

## 2023-09-09 DIAGNOSIS — R29898 Other symptoms and signs involving the musculoskeletal system: Secondary | ICD-10-CM | POA: Diagnosis not present

## 2023-09-09 DIAGNOSIS — R1312 Dysphagia, oropharyngeal phase: Secondary | ICD-10-CM | POA: Diagnosis not present

## 2023-09-09 NOTE — Progress Notes (Signed)
 Location:   [facility] Room Number: 18 Place of Service:  SNF (31) Provider: [facility] Lashana Spang NP  Donley Furth, MD  Patient Care Team: Donley Furth, MD as PCP - General (Family Medicine) Wendie Hamburg, MD as PCP - Cardiology (Cardiology) Maris Sickle, MD as Consulting Physician (Ophthalmology) Seward Dao Alleen Arbour, MD as Consulting Physician (Ophthalmology) Duke, Warren Haber, PA as Physician Assistant (Cardiology)  Extended Emergency Contact Information Primary Emergency Contact: Garrett,Cathy  [place] Home Phone: [phone] Mobile Phone: [phone] Relation: Daughter Secondary Emergency Contact: Gwenyth Leo  [place] Home Phone: [phone] Relation: Daughter  Code Status:  DNR Goals of care: Advanced Directive information    08/26/2023    2:19 PM  Advanced Directives  Does Patient Have a Medical Advance Directive? Yes  Type of Estate agent of [place];Living will  Does patient want to make changes to medical advance directive? No - Patient declined  Copy of Healthcare Power of Attorney in Chart? Yes - validated most recent copy scanned in chart (See row information)     Chief Complaint  Patient presents with   Medical Management of Chronic Issues    HPI:  Pt is a 89 y.o. male seen today for medical management of chronic diseases.    Chronic dermatitis buttocks, followed by dermatology, presently: Mometasone, Fluocinonide , Nystatin  Dysphagia, choked in the past, chopped meat with thin fluid presently. Underwent GI evaluation. CXR normal 03/07/23             Anxiety, failed effexor, stated sleeps well at night, but persisted irritable at times, improved since agreed to Lexapro 5mg  every day 08/25/23             The right arm pain, more at night, Tylenol  at night helped                03/24/23 right elbow fx(distal end of right humerus), healed with deformity and limited ROM, has been to  surgeon-non surgical candidate.             CAD ST elevation MI LAD 05/2021, underwent Cath, stenting, s/p percutaneous coronary angioplasty, started DAPT, Echo EF 40-45%,  65-70% 11/02/21, started Plavix  12/24/21 per [facility]             PE 11/02/21, had CTA 05/12/22, 09/08/22 Cardiology dc Eliquis , then resumed 03/2023 and off ASA and Plavix  03/2023 in setting of Afib.  Repeated CT @ 6 mos,  negative PE             Grade 2 diastolic dysfunction, swelling BLE, SOB, placed on Furosemide  in setting of weight gained, improved, followed by Cardiology, BNP 231/7 06/23/23             Afib, resumed Eliquis  and dc'd ASA/Plavix  03/2023 per cardiology, on  Metoprolol , EKG 08/08/23 Afib with slow HR 50s.              GERD, thing liquid, hx of dilatation,  takes Pantoprazole , Hgb 13.2 06/23/23             Hypokalemia, on Kcl K 3.9 07/07/23             Hyponatremia, Na 140 07/07/23             HLD off Atorvastatin  recommended by Cardiology 06/28/23, LDL 30 11/12/22             HTN, on Metoprolol              Peripheral neuropathy,  on Gabapentin              Cognitive impairment: MMSE 19/30 05/20/23, SN[facility] for care needs.          Past Medical History:  Diagnosis Date   Arthritis    Diverticulosis of colon    External thrombosed hemorrhoids 06/11/2009   Qualifier: Diagnosis of  By: Alyne Babinski MD, Lenis Quin    GERD (gastroesophageal reflux disease)    Hypertension    Past Surgical History:  Procedure Laterality Date   cataract extaction, right  6/09   per Dr. Norberta Beans   CATARACT EXTRACTION Left    COLONOSCOPY  09/23/04   per Dr. Toribio Frees, clear, no repeats needed    CORONARY STENT INTERVENTION N/A 06/06/2021   Procedure: CORONARY STENT INTERVENTION;  Surgeon: Sammy Crisp, MD;  Location: [facility];  Service: Cardiovascular;  Laterality: N/A;   CORONARY/GRAFT ACUTE MI REVASCULARIZATION N/A 06/06/2021   Procedure: Coronary/Graft Acute MI Revascularization;  Surgeon: Sammy Crisp, MD;  Location: [facility];  Service: Cardiovascular;  Laterality: N/A;   EGD and esophageal diatation,  05-10-11   per Dr. Howard Macho     HERNIA REPAIR     LEFT HEART CATH AND CORONARY ANGIOGRAPHY N/A 06/06/2021   Procedure: LEFT HEART CATH AND CORONARY ANGIOGRAPHY;  Surgeon: Sammy Crisp, MD;  Location: [facility];  Service: Cardiovascular;  Laterality: N/A;   rt inguinal hernia     x2    Allergies  Allergen Reactions   Effexor [Venlafaxine]     Pt became lethargic, drowsy and not his usual self with effexor.   Contrast Media [Iodinated Contrast Media] Rash    Allergies as of 09/09/2023       Reactions   Effexor [venlafaxine]    Pt became lethargic, drowsy and not his usual self with effexor.   Contrast Media [iodinated Contrast Media] Rash        Medication List        Accurate as of Sep 09, 2023 11:59 PM. If you have any questions, ask your nurse or doctor.          acetaminophen  325 MG tablet Commonly known as: TYLENOL  Take 650 mg by mouth every 6 (six) hours as needed.   acetaminophen  500 MG tablet Commonly known as: TYLENOL  Take 500 mg by mouth 2 (two) times daily.   antiseptic oral rinse Liqd 15 mLs by Mouth Rinse route as needed for dry mouth.   apixaban  5 MG Tabs tablet Commonly known as: ELIQUIS  Take 1 tablet (5 mg total) by mouth 2 (two) times daily.   atorvastatin  40 MG tablet Commonly known as: LIPITOR Take 1 tablet (40 mg total) by mouth daily.   Biofreeze Roll-On 4 % Gel Generic drug: Menthol (Topical Analgesic) Apply topically. Apply to neck topically two times a day   CeraVe Itch Relief 1 % Lotn Generic drug: pramoxine Apply 1 Application topically 2 (two) times daily.   clotrimazole -betamethasone  cream Commonly known as: LOTRISONE  Apply 1 Application topically 2 (two) times daily as needed. Fax#[phone]   docusate sodium 100 MG capsule Commonly known as: COLACE Take 100 mg by mouth 2 (two) times daily.   escitalopram 5 MG  tablet Commonly known as: LEXAPRO Take 5 mg by mouth daily.   feeding supplement Liqd Take 1 Container by mouth 2 (two) times daily between meals.   fexofenadine  180 MG tablet Commonly known as: ALLEGRA  Take 180 mg by mouth daily.   fluocinonide  cream 0.05 % Commonly  known as: LIDEX  Apply 1 Application topically 2 (two) times daily.   fluticasone  50 MCG/ACT nasal spray Commonly known as: FLONASE  Place 2 sprays into both nostrils daily.   furosemide  40 MG tablet Commonly known as: LASIX  Take 1.5 pills daily for 3 days then resume 40 mg daily.   gabapentin  300 MG capsule Commonly known as: NEURONTIN  TAKE 1 CAPSULE BY MOUTH THREE TIMES A DAY   guaiFENesin  600 MG 12 hr tablet Commonly known as: MUCINEX  Take 1,200 mg by mouth 2 (two) times daily.   metoprolol  succinate 25 MG 24 hr tablet Commonly known as: Toprol  XL Take 0.5 tablets (12.5 mg total) by mouth in the morning and at bedtime.   mometasone 0.1 % cream Commonly known as: ELOCON Apply 1 Application topically daily.   nitroGLYCERIN  0.4 MG SL tablet Commonly known as: Nitrostat  Place 1 tablet (0.4 mg total) under the tongue every 5 (five) minutes as needed for chest pain.   nystatin  powder Commonly known as: MYCOSTATIN /NYSTOP  Apply 1 Application topically 3 (three) times daily.   pantoprazole  40 MG tablet Commonly known as: PROTONIX  Take 1 tablet (40 mg total) by mouth daily.   polyvinyl alcohol 1.4 % ophthalmic solution Commonly known as: LIQUIFILM TEARS Place 2 drops into the right eye 2 (two) times daily.   potassium chloride  SA 20 MEQ tablet Commonly known as: KLOR-CON  M Take 1 tablet (20 mEq total) by mouth daily.   zinc oxide 20 % ointment Apply 1 Application topically daily at 12 noon. Apply to buttocks after applying Fluocinonide  Cream        Review of Systems  Constitutional:  Negative for appetite change, fatigue and fever.  HENT:  Positive for hearing loss and trouble swallowing.  Negative for congestion, postnasal drip and rhinorrhea.   Eyes:  Negative for visual disturbance.  Respiratory:  Positive for cough. Negative for chest tightness, shortness of breath and wheezing.   Cardiovascular:  Positive for leg swelling.  Gastrointestinal:  Negative for abdominal pain and constipation.  Genitourinary:  Negative for dysuria, frequency and urgency.       Incontinent of urine  Musculoskeletal:  Positive for arthralgias and gait problem. Negative for joint swelling.       R arm pain.   Skin:  Positive for rash.  Neurological:  Negative for speech difficulty, weakness and headaches.  Psychiatric/Behavioral:  Negative for confusion and sleep disturbance. The patient is not nervous/anxious.     Immunization History  Administered Date(s) Administered   Fluad Quad(high Dose 65+) 02/15/2022, 02/08/2023   Influenza Split 01/20/2012, 01/07/2013   Influenza Whole 02/09/2006, 01/11/2008   Influenza, High Dose Seasonal PF 12/30/2016, 12/27/2017, 01/08/2019   Influenza,inj,quad, With Preservative 12/29/2018   Influenza-Unspecified 01/17/2014, 01/14/2016, 12/30/2016   Moderna Covid-19 Fall Seasonal Vaccine 76yrs & older 03/22/2022, 02/09/2023   PFIZER Comirnaty(Gray Top)Covid-19 Tri-Sucrose Vaccine 05/28/2019, 03/04/2020, 09/23/2020   Pfizer Covid-19 Vaccine Bivalent Booster 34yrs & up 02/25/2022   Pneumococcal Conjugate-13 09/23/2015   Pneumococcal Polysaccharide-23 03/13/2008   Td 12/20/2007   Tdap 07/06/2021   Unspecified SARS-COV-2 Vaccination 04/30/2019   Zoster Recombinant(Shingrix) 03/11/2020, 06/20/2020   Pertinent  Health Maintenance Due  Topic Date Due   INFLUENZA VACCINE  11/25/2023   FOOT EXAM  Discontinued   HEMOGLOBIN A1C  Discontinued   OPHTHALMOLOGY EXAM  Discontinued      05/04/2022    2:44 PM 06/01/2022   11:04 AM 06/15/2022    9:14 AM 06/23/2022    3:12 PM 10/19/2022   11:19 AM  Fall  Risk  Falls in the past year? 1 1 1 1  Exclusion - non ambulatory  Was  there an injury with Fall? 1 1 1 1    Was there an injury with Fall? - Comments    Fx Rt Ekbow. Followed by Orthopedic   Fall Risk Category Calculator 3 3 3 2    Fall Risk Category (Retired) High      (RETIRED) Patient Fall Risk Level High fall risk      Patient at Risk for Falls Due to History of fall(s);Impaired balance/gait;Impaired mobility;Impaired vision History of fall(s);Impaired balance/gait;Impaired mobility;Impaired vision History of fall(s);Impaired balance/gait;Impaired mobility Orthopedic patient   Fall risk Follow up Falls evaluation completed Falls evaluation completed Falls evaluation completed Falls prevention discussed Falls evaluation completed   Functional Status Survey:    Vitals:   09/09/23 1628 09/16/23 1440  BP: (!) 161/91 (!) 141/79  Pulse: 65   Resp: (!) 22   Temp: 97.6 F (36.4 C)   SpO2: 95%   Weight: 146 lb 9.6 oz (66.5 kg)    Body mass index is 22.96 kg/m. Physical Exam Vitals and nursing note reviewed.  Constitutional:      Appearance: Normal appearance.  HENT:     Head: Normocephalic.     Comments:      Nose: Nose normal. No congestion.     Mouth/Throat:     Mouth: Mucous membranes are moist.  Eyes:     Extraocular Movements: Extraocular movements intact.     Conjunctiva/sclera: Conjunctivae normal.     Pupils: Pupils are equal, round, and reactive to light.  Cardiovascular:     Rate and Rhythm: Normal rate. Rhythm irregular.     Heart sounds: No murmur heard. Pulmonary:     Effort: Pulmonary effort is normal.     Breath sounds: Rales present. No wheezing or rhonchi.     Comments: Bibasilar rales. Central congestion Abdominal:     General: Bowel sounds are normal.     Palpations: Abdomen is soft.     Tenderness: There is no abdominal tenderness.  Musculoskeletal:        General: Deformity present. No tenderness.     Cervical back: Normal range of motion and neck supple.     Right lower leg: Edema present.     Left lower leg: Edema  present.     Comments: Right elbow deformity and decreased ROM Swelling BLE minimal  Skin:    General: Skin is warm and dry.     Findings: Erythema and rash present.     Comments: Chronic perineal/buttocks skin irritation from moist and heat: dark reddened buttocks from previous examination   Neurological:     General: No focal deficit present.     Mental Status: He is alert and oriented to person, place, and time. Mental status is at baseline.     Gait: Gait abnormal.     Comments: W/c for mobility.   Psychiatric:        Mood and Affect: Mood normal.        Behavior: Behavior normal.        Thought Content: Thought content normal.     Labs reviewed: Recent Labs    05/10/23 0000 06/23/23 1142 07/07/23 0000  NA 131* 140 140  K 3.7 3.6 3.9  CL 98* 102 104  CO2 24* 20 27*  GLUCOSE  --  107*  --   BUN 9 15 22*  CREATININE 0.5* 0.56* 0.5*  CALCIUM  8.6* 8.6 8.9   Recent  Labs    02/15/23 0000 05/10/23 0000 06/23/23 1142  AST 25 25 37  ALT 20 22 32  ALKPHOS 134* 129* 171*  BILITOT  --   --  1.0  PROT  --   --  6.0  ALBUMIN 3.5 3.3* 3.1*   Recent Labs    03/08/23 0000 05/10/23 0000 06/23/23 1142  WBC 8.1 9.0 11.0*  NEUTROABS  --  7,344.00  --   HGB 13.5 12.3* 13.2  HCT 40* 37* 40.7  MCV  --   --  89  PLT 300 249 309   Lab Results  Component Value Date   TSH 2.039 11/02/2021   Lab Results  Component Value Date   HGBA1C 5.6 11/12/2022   Lab Results  Component Value Date   CHOL 79 11/12/2022   HDL 33 (A) 11/12/2022   LDLCALC 30 11/12/2022   LDLDIRECT 72.0 09/27/2017   TRIG 80 11/12/2022   CHOLHDL 2 10/14/2021    Significant Diagnostic Results in last 30 days:  No results found.  Assessment/Plan  Anxiety His mood has improved since agreed to Lexapro 5mg  every day 08/25/23  Dysphagia choked in the past, chopped meat with thin fluid presently. Underwent GI evaluation. CXR normal 03/07/23  Dermatitis Chronic dermatitis buttocks, followed by  dermatology, presently: Mometasone, Fluocinonide , Nystatin   CAD S/P percutaneous coronary angioplasty ST elevation MI LAD 05/2021, underwent Cath, stenting, s/p percutaneous coronary angioplasty, started DAPT, Echo EF 40-45%,  65-70% 11/02/21, started Plavix  12/24/21 per [facility]  History of pulmonary embolism  PE 11/02/21, had CTA 05/12/22, 09/08/22 Cardiology dc Eliquis , then resumed 03/2023 and off ASA and Plavix  03/2023 in setting of Afib.  Repeated CT @ 6 mos,  negative PE  Congestive heart failure (CHF) (HCC) Grade 2 diastolic dysfunction, swelling BLE, SOB, placed on Furosemide  in setting of weight gained, improved, followed by Cardiology, BNP 231/7 06/23/23  Paroxysmal atrial fibrillation (HCC) resumed Eliquis  and dc'd ASA/Plavix  03/2023 per cardiology, on  Metoprolol , EKG 08/08/23 Afib with slow HR 50s.   GERD  thing liquid, hx of dilatation,  takes Pantoprazole , Hgb 13.2 06/23/23  Hypokalemia  on Kcl K 3.9 07/07/23  Hyponatremia , Na 140 07/07/23  Hyperlipidemia off Atorvastatin  recommended by Cardiology 06/28/23, LDL 30 11/12/22  Essential hypertension Loose control Bp, continue Furosemide  and Metoprolol .   Neuropathy, peripheral On Gabapentin , power w/c for mobility, mechanical lift for transfer.   Cognitive impairment  MMSE 19/30 05/20/23, SNF [facility] for care needs.      Family/ staff Communication: plan of care reviewed with the patient and charge nurse.   Labs/tests ordered:  none

## 2023-09-12 DIAGNOSIS — R1312 Dysphagia, oropharyngeal phase: Secondary | ICD-10-CM | POA: Diagnosis not present

## 2023-09-12 DIAGNOSIS — R29898 Other symptoms and signs involving the musculoskeletal system: Secondary | ICD-10-CM | POA: Diagnosis not present

## 2023-09-13 DIAGNOSIS — R29898 Other symptoms and signs involving the musculoskeletal system: Secondary | ICD-10-CM | POA: Diagnosis not present

## 2023-09-13 DIAGNOSIS — R1312 Dysphagia, oropharyngeal phase: Secondary | ICD-10-CM | POA: Diagnosis not present

## 2023-09-14 DIAGNOSIS — R1312 Dysphagia, oropharyngeal phase: Secondary | ICD-10-CM | POA: Diagnosis not present

## 2023-09-14 DIAGNOSIS — R29898 Other symptoms and signs involving the musculoskeletal system: Secondary | ICD-10-CM | POA: Diagnosis not present

## 2023-09-15 DIAGNOSIS — R29898 Other symptoms and signs involving the musculoskeletal system: Secondary | ICD-10-CM | POA: Diagnosis not present

## 2023-09-15 DIAGNOSIS — R1312 Dysphagia, oropharyngeal phase: Secondary | ICD-10-CM | POA: Diagnosis not present

## 2023-09-16 DIAGNOSIS — R4189 Other symptoms and signs involving cognitive functions and awareness: Secondary | ICD-10-CM | POA: Insufficient documentation

## 2023-09-16 DIAGNOSIS — F0390 Unspecified dementia without behavioral disturbance: Secondary | ICD-10-CM | POA: Insufficient documentation

## 2023-09-16 NOTE — Assessment & Plan Note (Signed)
 On Gabapentin , power w/c for mobility, mechanical lift for transfer.

## 2023-09-16 NOTE — Assessment & Plan Note (Signed)
 PE 11/02/21, had CTA 05/12/22, 09/08/22 Cardiology dc Eliquis, then resumed 03/2023 and off ASA and Plavix 03/2023 in setting of Afib.  Repeated CT @ 6 mos,  negative PE

## 2023-09-16 NOTE — Assessment & Plan Note (Signed)
ST elevation MI LAD 05/2021, underwent Cath, stenting, s/p percutaneous coronary angioplasty, started DAPT, Echo EF 40-45%,  65-70% 11/02/21, started Plavix 12/24/21 per [facility] 

## 2023-09-16 NOTE — Assessment & Plan Note (Signed)
 Grade 2 diastolic dysfunction, swelling BLE, SOB, placed on Furosemide in setting of weight gained, improved, followed by Cardiology, BNP 231/7 06/23/23

## 2023-09-16 NOTE — Assessment & Plan Note (Signed)
 thing liquid, hx of dilatation,  takes Pantoprazole, Hgb 13.2 06/23/23

## 2023-09-16 NOTE — Assessment & Plan Note (Signed)
 MMSE 19/30 05/20/23, SNF [facility] for care needs.

## 2023-09-16 NOTE — Assessment & Plan Note (Signed)
 on Kcl K 3.9 07/07/23

## 2023-09-16 NOTE — Assessment & Plan Note (Signed)
 His mood has improved since agreed to Lexapro 5mg  every day 08/25/23

## 2023-09-16 NOTE — Assessment & Plan Note (Signed)
 Loose control Bp, continue Furosemide  and Metoprolol .

## 2023-09-16 NOTE — Assessment & Plan Note (Signed)
 off Atorvastatin  recommended by Cardiology 06/28/23, LDL 30 11/12/22

## 2023-09-16 NOTE — Assessment & Plan Note (Signed)
,   Na 140 07/07/23

## 2023-09-16 NOTE — Assessment & Plan Note (Signed)
 choked in the past, chopped meat with thin fluid presently. Underwent GI evaluation. CXR normal 03/07/23

## 2023-09-16 NOTE — Assessment & Plan Note (Signed)
 resumed Eliquis  and dc'd ASA/Plavix  03/2023 per cardiology, on  Metoprolol , EKG 08/08/23 Afib with slow HR 50s.

## 2023-09-16 NOTE — Assessment & Plan Note (Signed)
 Chronic dermatitis buttocks, followed by dermatology, presently: Mometasone, Fluocinonide , Nystatin 

## 2023-09-21 DIAGNOSIS — R1312 Dysphagia, oropharyngeal phase: Secondary | ICD-10-CM | POA: Diagnosis not present

## 2023-09-21 DIAGNOSIS — R29898 Other symptoms and signs involving the musculoskeletal system: Secondary | ICD-10-CM | POA: Diagnosis not present

## 2023-09-23 DIAGNOSIS — R29898 Other symptoms and signs involving the musculoskeletal system: Secondary | ICD-10-CM | POA: Diagnosis not present

## 2023-09-23 DIAGNOSIS — R1312 Dysphagia, oropharyngeal phase: Secondary | ICD-10-CM | POA: Diagnosis not present

## 2023-09-26 DIAGNOSIS — R1312 Dysphagia, oropharyngeal phase: Secondary | ICD-10-CM | POA: Diagnosis not present

## 2023-09-26 DIAGNOSIS — R29898 Other symptoms and signs involving the musculoskeletal system: Secondary | ICD-10-CM | POA: Diagnosis not present

## 2023-09-27 DIAGNOSIS — R29898 Other symptoms and signs involving the musculoskeletal system: Secondary | ICD-10-CM | POA: Diagnosis not present

## 2023-09-27 DIAGNOSIS — R1312 Dysphagia, oropharyngeal phase: Secondary | ICD-10-CM | POA: Diagnosis not present

## 2023-09-29 DIAGNOSIS — R29898 Other symptoms and signs involving the musculoskeletal system: Secondary | ICD-10-CM | POA: Diagnosis not present

## 2023-09-29 DIAGNOSIS — R1312 Dysphagia, oropharyngeal phase: Secondary | ICD-10-CM | POA: Diagnosis not present

## 2023-10-02 DIAGNOSIS — R29898 Other symptoms and signs involving the musculoskeletal system: Secondary | ICD-10-CM | POA: Diagnosis not present

## 2023-10-02 DIAGNOSIS — R1312 Dysphagia, oropharyngeal phase: Secondary | ICD-10-CM | POA: Diagnosis not present

## 2023-10-06 DIAGNOSIS — R29898 Other symptoms and signs involving the musculoskeletal system: Secondary | ICD-10-CM | POA: Diagnosis not present

## 2023-10-06 DIAGNOSIS — R1312 Dysphagia, oropharyngeal phase: Secondary | ICD-10-CM | POA: Diagnosis not present

## 2023-10-07 DIAGNOSIS — R29898 Other symptoms and signs involving the musculoskeletal system: Secondary | ICD-10-CM | POA: Diagnosis not present

## 2023-10-07 DIAGNOSIS — R1312 Dysphagia, oropharyngeal phase: Secondary | ICD-10-CM | POA: Diagnosis not present

## 2023-10-10 DIAGNOSIS — R1312 Dysphagia, oropharyngeal phase: Secondary | ICD-10-CM | POA: Diagnosis not present

## 2023-10-10 DIAGNOSIS — R29898 Other symptoms and signs involving the musculoskeletal system: Secondary | ICD-10-CM | POA: Diagnosis not present

## 2023-10-12 DIAGNOSIS — R29898 Other symptoms and signs involving the musculoskeletal system: Secondary | ICD-10-CM | POA: Diagnosis not present

## 2023-10-12 DIAGNOSIS — R1312 Dysphagia, oropharyngeal phase: Secondary | ICD-10-CM | POA: Diagnosis not present

## 2023-10-14 DIAGNOSIS — R29898 Other symptoms and signs involving the musculoskeletal system: Secondary | ICD-10-CM | POA: Diagnosis not present

## 2023-10-14 DIAGNOSIS — R1312 Dysphagia, oropharyngeal phase: Secondary | ICD-10-CM | POA: Diagnosis not present

## 2023-10-18 ENCOUNTER — Non-Acute Institutional Stay (SKILLED_NURSING_FACILITY): Admitting: Nurse Practitioner

## 2023-10-18 ENCOUNTER — Encounter: Payer: Self-pay | Admitting: Nurse Practitioner

## 2023-10-18 DIAGNOSIS — Z86711 Personal history of pulmonary embolism: Secondary | ICD-10-CM | POA: Diagnosis not present

## 2023-10-18 DIAGNOSIS — I480 Paroxysmal atrial fibrillation: Secondary | ICD-10-CM

## 2023-10-18 DIAGNOSIS — I2510 Atherosclerotic heart disease of native coronary artery without angina pectoris: Secondary | ICD-10-CM | POA: Diagnosis not present

## 2023-10-18 DIAGNOSIS — Z9861 Coronary angioplasty status: Secondary | ICD-10-CM | POA: Diagnosis not present

## 2023-10-18 DIAGNOSIS — I5043 Acute on chronic combined systolic (congestive) and diastolic (congestive) heart failure: Secondary | ICD-10-CM | POA: Diagnosis not present

## 2023-10-18 DIAGNOSIS — I10 Essential (primary) hypertension: Secondary | ICD-10-CM

## 2023-10-18 DIAGNOSIS — M150 Primary generalized (osteo)arthritis: Secondary | ICD-10-CM

## 2023-10-18 DIAGNOSIS — K219 Gastro-esophageal reflux disease without esophagitis: Secondary | ICD-10-CM

## 2023-10-18 DIAGNOSIS — M79646 Pain in unspecified finger(s): Secondary | ICD-10-CM

## 2023-10-18 DIAGNOSIS — M79643 Pain in unspecified hand: Secondary | ICD-10-CM | POA: Diagnosis not present

## 2023-10-18 NOTE — Assessment & Plan Note (Signed)
 PE 11/02/21, had CTA 05/12/22, 09/08/22 Cardiology dc Eliquis, then resumed 03/2023 and off ASA and Plavix 03/2023 in setting of Afib.  Repeated CT @ 6 mos,  negative PE

## 2023-10-18 NOTE — Assessment & Plan Note (Signed)
 Grade 2 diastolic dysfunction, swelling BLE, SOB, placed on Furosemide in setting of weight gained, improved, followed by Cardiology, BNP 231/7 06/23/23

## 2023-10-18 NOTE — Assessment & Plan Note (Signed)
 resumed Eliquis  and dc'd ASA/Plavix  03/2023 per cardiology, on  Metoprolol , EKG 08/08/23 Afib with slow HR 50s.

## 2023-10-18 NOTE — Progress Notes (Signed)
 Location:   [facility]  Nursing Home Room Number: 65-A Place of Service:  SNF (31) Provider:  Bryar Dahms, NP  PCP: Johnny Garnette LABOR, MD  Patient Care Team: Johnny Garnette LABOR, MD as PCP - General (Family Medicine) Kate Lonni CROME, MD as PCP - Cardiology (Cardiology) Octavia Charleston, MD as Consulting Physician (Ophthalmology) Elner Arley LABOR, MD as Consulting Physician (Ophthalmology) Duke, Jon Garre, PA as Physician Assistant (Cardiology)  Extended Emergency Contact Information Primary Emergency Contact: Garrett,Cathy  [place] Home Phone: [phone] Mobile Phone: [phone] Relation: Daughter Secondary Emergency Contact: Leia Pulling  [place] Home Phone: [phone] Relation: Daughter  Code Status:  FULL CODE Goals of care: Advanced Directive information    10/18/2023   11:06 AM  Advanced Directives  Does Patient Have a Medical Advance Directive? Yes  Type of Estate agent of [place];Living will  Does patient want to make changes to medical advance directive? No - Patient declined  Copy of Healthcare Power of Attorney in Chart? Yes - validated most recent copy scanned in chart (See row information)     Chief Complaint  Patient presents with   Pain    Right 2nd & 3rd finger pain.     HPI:  Pt is a 89 y.o. male seen today for an acute visit for the right 2nd and 3rd finger pain, redness, mild swelling for 1 day.  Pain experienced with range of motion, denied injury or trauma.  Noted a blister at ulnar aspect of right second finger.       Chronic dermatitis buttocks, followed by dermatology, presently: Mometasone, Fluocinonide , Nystatin  Dysphagia, choked in the past, chopped meat with thin fluid presently. Underwent GI evaluation. CXR normal 03/07/23             Anxiety, failed effexor, self discontinued Lexapro.  Occasional irritable.             The right arm pain, more at night, Tylenol  at  night helped               03/24/23 right elbow fx(distal end of right humerus), healed with deformity and limited ROM, has been to surgeon-non surgical candidate.             CAD ST elevation MI LAD 05/2021, underwent Cath, stenting, s/p percutaneous coronary angioplasty, started DAPT, Echo EF 40-45%,  65-70% 11/02/21, started Plavix  12/24/21 per [facility]             PE 11/02/21, had CTA 05/12/22, 09/08/22 Cardiology dc Eliquis , then resumed 03/2023 and off ASA and Plavix  03/2023 in setting of Afib.  Repeated CT @ 6 mos,  negative PE             Grade 2 diastolic dysfunction, swelling BLE, SOB, placed on Furosemide  in setting of weight gained, improved, followed by Cardiology, BNP 231/7 06/23/23             Afib, resumed Eliquis  and dc'd ASA/Plavix  03/2023 per cardiology, on  Metoprolol , EKG 08/08/23 Afib with slow HR 50s.              GERD, thing liquid, hx of dilatation,  takes Pantoprazole , Hgb 13.2 06/23/23             Hypokalemia, on Kcl K 3.9 07/07/23             Hyponatremia, Na 140 07/07/23             HLD off Atorvastatin  recommended by Cardiology  06/28/23, LDL 30 11/12/22             HTN, on Metoprolol              Peripheral neuropathy, on Gabapentin              Cognitive impairment: MMSE 19/30 05/20/23, SNF [facility] for care needs.       Past Medical History:  Diagnosis Date   Arthritis    Diverticulosis of colon    External thrombosed hemorrhoids 06/11/2009   Qualifier: Diagnosis of  By: Johnny MD, Garnette LABOR    GERD (gastroesophageal reflux disease)    Hypertension    Past Surgical History:  Procedure Laterality Date   cataract extaction, right  6/09   per Dr. Lorrene   CATARACT EXTRACTION Left    COLONOSCOPY  09/23/04   per Dr. Nolon, clear, no repeats needed    CORONARY STENT INTERVENTION N/A 06/06/2021   Procedure: CORONARY STENT INTERVENTION;  Surgeon: Mady Bruckner, MD;  Location: [facility];  Service: Cardiovascular;  Laterality: N/A;   CORONARY/GRAFT ACUTE MI  REVASCULARIZATION N/A 06/06/2021   Procedure: Coronary/Graft Acute MI Revascularization;  Surgeon: Mady Bruckner, MD;  Location: [facility];  Service: Cardiovascular;  Laterality: N/A;   EGD and esophageal diatation,  05-10-11   per Dr. Teressa     HERNIA REPAIR     LEFT HEART CATH AND CORONARY ANGIOGRAPHY N/A 06/06/2021   Procedure: LEFT HEART CATH AND CORONARY ANGIOGRAPHY;  Surgeon: Mady Bruckner, MD;  Location: [facility];  Service: Cardiovascular;  Laterality: N/A;   rt inguinal hernia     x2    Allergies  Allergen Reactions   Effexor [Venlafaxine]     Pt became lethargic, drowsy and not his usual self with effexor.   Contrast Media [Iodinated Contrast Media] Rash    Allergies as of 10/18/2023       Reactions   Effexor [venlafaxine]    Pt became lethargic, drowsy and not his usual self with effexor.   Contrast Media [iodinated Contrast Media] Rash        Medication List        Accurate as of October 18, 2023 12:01 PM. If you have any questions, ask your nurse or doctor.          STOP taking these medications    atorvastatin  40 MG tablet Commonly known as: LIPITOR Stopped by: Porfiria Heinrich X Yuna Pizzolato   Biofreeze Roll-On 4 % Gel Generic drug: Menthol (Topical Analgesic) Stopped by: Nickolaus Bordelon X Lourie Retz   escitalopram 5 MG tablet Commonly known as: LEXAPRO Stopped by: Beda Dula X Georgena Weisheit   feeding supplement Liqd Stopped by: Cypress Fanfan X Yakelin Grenier   fexofenadine  180 MG tablet Commonly known as: ALLEGRA  Stopped by: Meighan Treto X Linnette Panella   zinc oxide 20 % ointment Stopped by: Croix Presley X Murphy Bundick       TAKE these medications    acetaminophen  500 MG tablet Commonly known as: TYLENOL  Take 500 mg by mouth 3 (three) times daily. What changed: Another medication with the same name was removed. Continue taking this medication, and follow the directions you see here. Changed by: Graclynn Vanantwerp X Runette Scifres   antiseptic oral rinse Liqd 15 mLs by Mouth Rinse route as needed for dry mouth.   apixaban  5 MG Tabs  tablet Commonly known as: ELIQUIS  Take 1 tablet (5 mg total) by mouth 2 (two) times daily.   artificial tears ophthalmic solution Place 2 drops into the right eye 2 (two) times daily.  CeraVe Itch Relief 1 % Lotn Generic drug: pramoxine Apply 1 Application topically 2 (two) times daily.   clotrimazole -betamethasone  cream Commonly known as: LOTRISONE  Apply 1 Application topically 2 (two) times daily as needed. Fax#[phone]   docusate sodium 100 MG capsule Commonly known as: COLACE Take 100 mg by mouth 2 (two) times daily.   fluocinonide  cream 0.05 % Commonly known as: LIDEX  Apply 1 Application topically 2 (two) times daily.   fluticasone  50 MCG/ACT nasal spray Commonly known as: FLONASE  Place 2 sprays into both nostrils daily.   furosemide  40 MG tablet Commonly known as: LASIX  Take 1.5 pills daily for 3 days then resume 40 mg daily.   gabapentin  300 MG capsule Commonly known as: NEURONTIN  TAKE 1 CAPSULE BY MOUTH THREE TIMES A DAY   guaiFENesin  600 MG 12 hr tablet Commonly known as: MUCINEX  Take 1,200 mg by mouth 2 (two) times daily.   metoprolol  succinate 25 MG 24 hr tablet Commonly known as: Toprol  XL Take 0.5 tablets (12.5 mg total) by mouth in the morning and at bedtime.   mometasone 0.1 % cream Commonly known as: ELOCON Apply 1 Application topically daily as needed.   nitroGLYCERIN  0.4 MG SL tablet Commonly known as: Nitrostat  Place 1 tablet (0.4 mg total) under the tongue every 5 (five) minutes as needed for chest pain.   nystatin  powder Commonly known as: MYCOSTATIN /NYSTOP  Apply 1 Application topically 3 (three) times daily.   pantoprazole  40 MG tablet Commonly known as: PROTONIX  Take 40 mg by mouth daily as needed. What changed: Another medication with the same name was removed. Continue taking this medication, and follow the directions you see here. Changed by: Roseland Braun X Dalaysia Harms   potassium chloride  SA 20 MEQ tablet Commonly known as: KLOR-CON   M Take 1 tablet (20 mEq total) by mouth daily.        Review of Systems  Immunization History  Administered Date(s) Administered   Fluad Quad(high Dose 65+) 02/15/2022, 02/08/2023   Influenza Split 01/20/2012, 01/07/2013   Influenza Whole 02/09/2006, 01/11/2008   Influenza, High Dose Seasonal PF 12/30/2016, 12/27/2017, 01/08/2019   Influenza,inj,quad, With Preservative 12/29/2018   Influenza-Unspecified 01/17/2014, 01/14/2016, 12/30/2016   Moderna Covid-19 Fall Seasonal Vaccine 3yrs & older 03/22/2022, 02/09/2023   PFIZER Comirnaty(Gray Top)Covid-19 Tri-Sucrose Vaccine 05/28/2019, 03/04/2020, 09/23/2020   Pfizer Covid-19 Vaccine Bivalent Booster 82yrs & up 02/25/2022   Pneumococcal Conjugate-13 09/23/2015   Pneumococcal Polysaccharide-23 03/13/2008   Td 12/20/2007   Tdap 07/06/2021   Unspecified SARS-COV-2 Vaccination 04/30/2019   Zoster Recombinant(Shingrix) 03/11/2020, 06/20/2020   Pertinent  Health Maintenance Due  Topic Date Due   INFLUENZA VACCINE  11/25/2023   FOOT EXAM  Discontinued   HEMOGLOBIN A1C  Discontinued   OPHTHALMOLOGY EXAM  Discontinued      05/04/2022    2:44 PM 06/01/2022   11:04 AM 06/15/2022    9:14 AM 06/23/2022    3:12 PM 10/19/2022   11:19 AM  Fall Risk  Falls in the past year? 1 1 1 1  Exclusion - non ambulatory  Was there an injury with Fall? 1 1 1 1    Was there an injury with Fall? - Comments    Fx Rt Ekbow. Followed by Orthopedic   Fall Risk Category Calculator 3 3 3 2    Fall Risk Category (Retired) High       (RETIRED) Patient Fall Risk Level High fall risk       Patient at Risk for Falls Due to History of fall(s);Impaired balance/gait;Impaired mobility;Impaired vision History of  fall(s);Impaired balance/gait;Impaired mobility;Impaired vision History of fall(s);Impaired balance/gait;Impaired mobility Orthopedic patient   Fall risk Follow up Falls evaluation completed  Falls evaluation completed Falls evaluation completed Falls prevention  discussed Falls evaluation completed     Data saved with a previous flowsheet row definition   Functional Status Survey:    Vitals:   10/18/23 1052  BP: 115/70  Pulse: 61  Resp: 16  Temp: 98.1 F (36.7 C)  SpO2: 90%  Weight: 145 lb (65.8 kg)  Height: 5' 7 (1.702 m)   Body mass index is 22.71 kg/m. Physical Exam  Labs reviewed: Recent Labs    05/10/23 0000 06/23/23 1142 07/07/23 0000  NA 131* 140 140  K 3.7 3.6 3.9  CL 98* 102 104  CO2 24* 20 27*  GLUCOSE  --  107*  --   BUN 9 15 22*  CREATININE 0.5* 0.56* 0.5*  CALCIUM  8.6* 8.6 8.9   Recent Labs    02/15/23 0000 05/10/23 0000 06/23/23 1142  AST 25 25 37  ALT 20 22 32  ALKPHOS 134* 129* 171*  BILITOT  --   --  1.0  PROT  --   --  6.0  ALBUMIN 3.5 3.3* 3.1*   Recent Labs    03/08/23 0000 05/10/23 0000 06/23/23 1142  WBC 8.1 9.0 11.0*  NEUTROABS  --  7,344.00  --   HGB 13.5 12.3* 13.2  HCT 40* 37* 40.7  MCV  --   --  89  PLT 300 249 309   Lab Results  Component Value Date   TSH 2.039 11/02/2021   Lab Results  Component Value Date   HGBA1C 5.6 11/12/2022   Lab Results  Component Value Date   CHOL 79 11/12/2022   HDL 33 (A) 11/12/2022   LDLCALC 30 11/12/2022   LDLDIRECT 72.0 09/27/2017   TRIG 80 11/12/2022   CHOLHDL 2 10/14/2021    Significant Diagnostic Results in last 30 days:  No results found.  Assessment/Plan Pain in hand and fingers  the right 2nd and 3rd finger pain, redness, mild swelling for 1 day.  Pain experienced with range of motion, denied injury or trauma.  Noted a blister at ulnar aspect of right second finger.  Applied Biofreeze 4 times daily to the right 2nd and 3rd fingers Obtain uric acid, CBC with differential, and BMP  Osteoarthritis The right arm pain, more at night, Tylenol  at night helped               03/24/23 right elbow fx(distal end of right humerus), healed with deformity and limited ROM, has been to surgeon-non surgical candidate.  CAD S/P  percutaneous coronary angioplasty ST elevation MI LAD 05/2021, underwent Cath, stenting, s/p percutaneous coronary angioplasty, started DAPT, Echo EF 40-45%,  65-70% 11/02/21, started Plavix  12/24/21 per [facility]  History of pulmonary embolism PE 11/02/21, had CTA 05/12/22, 09/08/22 Cardiology dc Eliquis , then resumed 03/2023 and off ASA and Plavix  03/2023 in setting of Afib.  Repeated CT @ 6 mos,  negative PE  Congestive heart failure (CHF) (HCC) Grade 2 diastolic dysfunction, swelling BLE, SOB, placed on Furosemide  in setting of weight gained, improved, followed by Cardiology, BNP 231/7 06/23/23  Paroxysmal atrial fibrillation (HCC) resumed Eliquis  and dc'd ASA/Plavix  03/2023 per cardiology, on  Metoprolol , EKG 08/08/23 Afib with slow HR 50s.   GERD resumed Eliquis  and dc'd ASA/Plavix  03/2023 per cardiology, on  Metoprolol , EKG 08/08/23 Afib with slow HR 50s.   Essential hypertension Blood pressure is controlled,  on Metoprolol   Family/ staff Communication: Plan of care reviewed with the patient and charge nurse  Labs/tests ordered:   Uric acid, CBC with differential, BMP

## 2023-10-18 NOTE — Assessment & Plan Note (Signed)
 the right 2nd and 3rd finger pain, redness, mild swelling for 1 day.  Pain experienced with range of motion, denied injury or trauma.  Noted a blister at ulnar aspect of right second finger.  Applied Biofreeze 4 times daily to the right 2nd and 3rd fingers Obtain uric acid, CBC with differential, and BMP

## 2023-10-18 NOTE — Assessment & Plan Note (Signed)
ST elevation MI LAD 05/2021, underwent Cath, stenting, s/p percutaneous coronary angioplasty, started DAPT, Echo EF 40-45%,  65-70% 11/02/21, started Plavix 12/24/21 per [facility] 

## 2023-10-18 NOTE — Assessment & Plan Note (Signed)
 The right arm pain, more at night, Tylenol  at night helped               03/24/23 right elbow fx(distal end of right humerus), healed with deformity and limited ROM, has been to surgeon-non surgical candidate.

## 2023-10-18 NOTE — Assessment & Plan Note (Signed)
Blood pressure is controlled, on Metoprolol

## 2023-10-19 DIAGNOSIS — I10 Essential (primary) hypertension: Secondary | ICD-10-CM | POA: Diagnosis not present

## 2023-10-19 LAB — CBC AND DIFFERENTIAL
HCT: 41 (ref 41–53)
Hemoglobin: 13.3 — AB (ref 13.5–17.5)
Neutrophils Absolute: 5290
Platelets: 286 K/uL (ref 150–400)
WBC: 7.6

## 2023-10-19 LAB — COMPREHENSIVE METABOLIC PANEL WITH GFR
Calcium: 8.9 (ref 8.7–10.7)
eGFR: 95

## 2023-10-19 LAB — BASIC METABOLIC PANEL WITH GFR
BUN: 19 (ref 4–21)
CO2: 24 — AB (ref 13–22)
Chloride: 104 (ref 99–108)
Creatinine: 0.5 — AB (ref 0.6–1.3)
Glucose: 102
Potassium: 4.5 meq/L (ref 3.5–5.1)
Sodium: 138 (ref 137–147)

## 2023-10-19 LAB — CBC: RBC: 4.52 (ref 3.87–5.11)

## 2023-10-20 ENCOUNTER — Encounter: Payer: PRIVATE HEALTH INSURANCE | Admitting: Family Medicine

## 2023-11-08 ENCOUNTER — Non-Acute Institutional Stay (SKILLED_NURSING_FACILITY): Payer: Self-pay | Admitting: Nurse Practitioner

## 2023-11-08 ENCOUNTER — Encounter: Payer: Self-pay | Admitting: Nurse Practitioner

## 2023-11-08 DIAGNOSIS — I480 Paroxysmal atrial fibrillation: Secondary | ICD-10-CM | POA: Diagnosis not present

## 2023-11-08 DIAGNOSIS — E785 Hyperlipidemia, unspecified: Secondary | ICD-10-CM

## 2023-11-08 DIAGNOSIS — I5043 Acute on chronic combined systolic (congestive) and diastolic (congestive) heart failure: Secondary | ICD-10-CM | POA: Diagnosis not present

## 2023-11-08 DIAGNOSIS — K219 Gastro-esophageal reflux disease without esophagitis: Secondary | ICD-10-CM | POA: Diagnosis not present

## 2023-11-08 DIAGNOSIS — Z86711 Personal history of pulmonary embolism: Secondary | ICD-10-CM

## 2023-11-08 DIAGNOSIS — F0390 Unspecified dementia without behavioral disturbance: Secondary | ICD-10-CM

## 2023-11-08 DIAGNOSIS — I10 Essential (primary) hypertension: Secondary | ICD-10-CM

## 2023-11-08 DIAGNOSIS — I2510 Atherosclerotic heart disease of native coronary artery without angina pectoris: Secondary | ICD-10-CM

## 2023-11-08 DIAGNOSIS — G609 Hereditary and idiopathic neuropathy, unspecified: Secondary | ICD-10-CM

## 2023-11-08 DIAGNOSIS — Z9861 Coronary angioplasty status: Secondary | ICD-10-CM | POA: Diagnosis not present

## 2023-11-08 NOTE — Assessment & Plan Note (Signed)
 resumed Eliquis  and dc'd ASA/Plavix  03/2023 per cardiology, on  Metoprolol , EKG 08/08/23 Afib with slow HR 50s.

## 2023-11-08 NOTE — Assessment & Plan Note (Signed)
 Stable, no choking episodes, thing liquid, hx of dilatation,  takes Pantoprazole , Hgb 13.3 on 10/19/2023

## 2023-11-08 NOTE — Assessment & Plan Note (Signed)
Blood pressure is controlled, on Metoprolol

## 2023-11-08 NOTE — Assessment & Plan Note (Signed)
 no apparent swelling BLE, SOB, placed on Furosemide  in setting of weight gained, improved, followed by Cardiology, BNP 231/7 06/23/23

## 2023-11-08 NOTE — Progress Notes (Signed)
 Location:  [facility] Room Number: [phone] A Place of Service:  SNF (31) Provider: [facility] Leonda Cristo, N.P.  Patient Care Team: Johnny Garnette LABOR, MD as PCP - General (Family Medicine) Kate Lonni CROME, MD as PCP - Cardiology (Cardiology) Octavia Charleston, MD as Consulting Physician (Ophthalmology) Elner Arley LABOR, MD as Consulting Physician (Ophthalmology) Duke, Jon Garre, PA as Physician Assistant (Cardiology)  Extended Emergency Contact Information Primary Emergency Contact: Garrett,Cathy  [place] Home Phone: [phone] Mobile Phone: [phone] Relation: Daughter Secondary Emergency Contact: Shepard,Susan  [place] Home Phone: [phone] Relation: Daughter  Code Status:  Full Code  Goals of care: Advanced Directive information    10/18/2023   11:06 AM  Advanced Directives  Does Patient Have a Medical Advance Directive? Yes  Type of Estate agent of [place];Living will  Does patient want to make changes to medical advance directive? No - Patient declined  Copy of Healthcare Power of Attorney in Chart? Yes - validated most recent copy scanned in chart (See row information)     Chief Complaint  Patient presents with   Medical Management of Chronic Issues    Routine visit     HPI:  Pt is a 89 y.o. male seen today for medical management of chronic diseases.     Chronic dermatitis buttocks, followed by dermatology, presently: Mometasone, Fluocinonide , Nystatin  Dysphagia, choked in the past, chopped meat with thin fluid presently. Underwent GI evaluation. CXR normal 03/07/23             Anxiety, failed effexor, self discontinued Lexapro.  Occasional irritable.             The right arm pain, more at night, Tylenol  at night helped               03/24/23 right elbow fx(distal end of right humerus), healed with deformity and limited ROM, has been to surgeon-non surgical candidate.             CAD  ST elevation MI LAD 05/2021, underwent Cath, stenting, s/p percutaneous coronary angioplasty, started DAPT, Echo EF 40-45%,  65-70% 11/02/21. On Eliquis  only now.              PE 11/02/21, had CTA 05/12/22, 09/08/22 Cardiology dc Eliquis , then resumed 03/2023 and off ASA and Plavix  03/2023 in setting of Afib.  Repeated CT @ 6 mos,  negative PE             Grade 2 diastolic dysfunction, no apparent swelling BLE, SOB, placed on Furosemide  in setting of weight gained, improved, followed by Cardiology, BNP 231/7 06/23/23             Afib, resumed Eliquis  and dc'd ASA/Plavix  03/2023 per cardiology, on  Metoprolol , EKG 08/08/23 Afib with slow HR 50s.              GERD, thing liquid, hx of dilatation,  takes Pantoprazole , Hgb 13.3 on 10/19/2023             Hypokalemia, on Kcl K 4.5 on 10/19/2023             Hyponatremia, Na 138 on 10/19/2023             HLD off Atorvastatin  recommended by Cardiology 06/28/23, LDL 30 11/12/22             HTN, on Metoprolol              Peripheral neuropathy, decreased gabapentin  300 mg twice  daily/3 times daily due to complaining of sleepiness             Cognitive impairment: MMSE 19/30 05/20/23, SNF [facility] for care needs.     Past Medical History:  Diagnosis Date   Arthritis    Diverticulosis of colon    External thrombosed hemorrhoids 06/11/2009   Qualifier: Diagnosis of  By: Johnny MD, Garnette LABOR    GERD (gastroesophageal reflux disease)    Hypertension    Past Surgical History:  Procedure Laterality Date   cataract extaction, right  6/09   per Dr. Lorrene   CATARACT EXTRACTION Left    COLONOSCOPY  09/23/04   per Dr. Nolon, clear, no repeats needed    CORONARY STENT INTERVENTION N/A 06/06/2021   Procedure: CORONARY STENT INTERVENTION;  Surgeon: Mady Bruckner, MD;  Location: [facility] INVASIVE [facility];  Service: Cardiovascular;  Laterality: N/A;   CORONARY/GRAFT ACUTE MI REVASCULARIZATION N/A 06/06/2021   Procedure: Coronary/Graft Acute MI Revascularization;  Surgeon: Mady Bruckner,  MD;  Location: [facility];  Service: Cardiovascular;  Laterality: N/A;   EGD and esophageal diatation,  05-10-11   per Dr. Teressa     HERNIA REPAIR     LEFT HEART CATH AND CORONARY ANGIOGRAPHY N/A 06/06/2021   Procedure: LEFT HEART CATH AND CORONARY ANGIOGRAPHY;  Surgeon: Mady Bruckner, MD;  Location: [facility];  Service: Cardiovascular;  Laterality: N/A;   rt inguinal hernia     x2    Allergies  Allergen Reactions   Effexor [Venlafaxine]     Pt became lethargic, drowsy and not his usual self with effexor.   Contrast Media [Iodinated Contrast Media] Rash    Outpatient Encounter Medications as of 11/08/2023  Medication Sig   acetaminophen  (TYLENOL ) 500 MG tablet Take 1,000 mg by mouth every 8 (eight) hours as needed.   antiseptic oral rinse (BIOTENE) LIQD 15 mLs by Mouth Rinse route as needed for dry mouth.   apixaban  (ELIQUIS ) 5 MG TABS tablet Take 1 tablet (5 mg total) by mouth 2 (two) times daily.   clotrimazole -betamethasone  (LOTRISONE ) cream Apply 1 Application topically 2 (two) times daily as needed. Fax#[phone]   docusate sodium (COLACE) 100 MG capsule Take 100 mg by mouth 2 (two) times daily.   fluocinonide  cream (LIDEX ) 0.05 % Apply 1 Application topically 2 (two) times daily.   fluticasone  (FLONASE ) 50 MCG/ACT nasal spray Place 2 sprays into both nostrils daily.   furosemide  (LASIX ) 40 MG tablet Take 1.5 pills daily for 3 days then resume 40 mg daily.   gabapentin  (NEURONTIN ) 300 MG capsule TAKE 1 CAPSULE BY MOUTH THREE TIMES A DAY   guaiFENesin  (MUCINEX ) 600 MG 12 hr tablet Take 1,200 mg by mouth 2 (two) times daily.   lactose free nutrition (BOOST) LIQD Take 237 mLs by mouth 3 (three) times daily.   Menthol-Zinc Oxide (CALMOSEPTINE) 0.44-20.6 % OINT Apply 1 Application topically 2 (two) times daily. To buttocks   metoprolol  succinate (TOPROL  XL) 25 MG 24 hr tablet Take 0.5 tablets (12.5 mg total) by mouth in the morning and at bedtime.   mometasone  (ELOCON) 0.1 % cream Apply 1 Application topically daily as needed.   nitroGLYCERIN  (NITROSTAT ) 0.4 MG SL tablet Place 1 tablet (0.4 mg total) under the tongue every 5 (five) minutes as needed for chest pain.   nystatin  (MYCOSTATIN /NYSTOP ) powder Apply 1 Application topically 3 (three) times daily.   pantoprazole  (PROTONIX ) 40 MG tablet Take 40 mg by mouth daily as needed.   polyvinyl  alcohol (LIQUIFILM TEARS) 1.4 % ophthalmic solution Place 2 drops into the right eye 2 (two) times daily.   potassium chloride  SA (KLOR-CON  M) 20 MEQ tablet Take 1 tablet (20 mEq total) by mouth daily.   pramoxine (CERAVE ITCH RELIEF) 1 % LOTN Apply 1 Application topically 2 (two) times daily.   No facility-administered encounter medications on file as of 11/08/2023.    Review of Systems  Constitutional:  Positive for fatigue. Negative for appetite change and fever.  HENT:  Positive for hearing loss and trouble swallowing. Negative for congestion.   Eyes:  Negative for visual disturbance.  Respiratory:  Positive for cough. Negative for chest tightness, shortness of breath and wheezing.   Cardiovascular:  Negative for leg swelling.  Gastrointestinal:  Negative for abdominal pain and constipation.  Genitourinary:  Negative for dysuria, frequency and urgency.       Incontinent of urine  Musculoskeletal:  Positive for arthralgias and gait problem. Negative for joint swelling.       R arm pain.   Skin:  Positive for rash.  Neurological:  Negative for speech difficulty, weakness and headaches.  Psychiatric/Behavioral:  Negative for confusion and sleep disturbance. The patient is not nervous/anxious.     Immunization History  Administered Date(s) Administered   Fluad Quad(high Dose 65+) 02/15/2022, 02/08/2023   Influenza Split 01/20/2012, 01/07/2013   Influenza Whole 02/09/2006, 01/11/2008   Influenza, High Dose Seasonal PF 12/30/2016, 12/27/2017, 01/08/2019   Influenza,inj,quad, With Preservative 12/29/2018    Influenza-Unspecified 01/17/2014, 01/14/2016, 12/30/2016   Moderna Covid-19 Fall Seasonal Vaccine 87yrs & older 03/22/2022, 02/09/2023   PFIZER Comirnaty(Gray Top)Covid-19 Tri-Sucrose Vaccine 05/28/2019, 03/04/2020, 09/23/2020   Pfizer Covid-19 Vaccine Bivalent Booster 94yrs & up 02/25/2022   Pneumococcal Conjugate-13 09/23/2015   Pneumococcal Polysaccharide-23 03/13/2008   Td 12/20/2007   Tdap 07/06/2021   Unspecified SARS-COV-2 Vaccination 04/30/2019   Zoster Recombinant(Shingrix) 03/11/2020, 06/20/2020   Pertinent  Health Maintenance Due  Topic Date Due   INFLUENZA VACCINE  11/25/2023   FOOT EXAM  Discontinued   HEMOGLOBIN A1C  Discontinued   OPHTHALMOLOGY EXAM  Discontinued      05/04/2022    2:44 PM 06/01/2022   11:04 AM 06/15/2022    9:14 AM 06/23/2022    3:12 PM 10/19/2022   11:19 AM  Fall Risk  Falls in the past year? 1 1 1 1  Exclusion - non ambulatory  Was there an injury with Fall? 1 1 1 1    Was there an injury with Fall? - Comments    Fx Rt Ekbow. Followed by Orthopedic   Fall Risk Category Calculator 3 3 3 2    Fall Risk Category (Retired) High       (RETIRED) Patient Fall Risk Level High fall risk       Patient at Risk for Falls Due to History of fall(s);Impaired balance/gait;Impaired mobility;Impaired vision History of fall(s);Impaired balance/gait;Impaired mobility;Impaired vision History of fall(s);Impaired balance/gait;Impaired mobility Orthopedic patient   Fall risk Follow up Falls evaluation completed  Falls evaluation completed Falls evaluation completed Falls prevention discussed Falls evaluation completed     Data saved with a previous flowsheet row definition   Functional Status Survey:    Vitals:   11/08/23 1228 11/08/23 1244  BP: (!) 142/69 (!) 142/79  Pulse: 69   Temp: 97.8 F (36.6 C)   SpO2: 90%   Weight: 145 lb 6.4 oz (66 kg)   Height: 5' 7 (1.702 m)    Body mass index is 22.77 kg/m. Physical Exam Vitals and nursing  note reviewed.   Constitutional:      Appearance: Normal appearance.  HENT:     Head: Normocephalic.     Comments:      Nose: Nose normal. No congestion.     Mouth/Throat:     Mouth: Mucous membranes are moist.  Eyes:     Extraocular Movements: Extraocular movements intact.     Conjunctiva/sclera: Conjunctivae normal.     Pupils: Pupils are equal, round, and reactive to light.  Cardiovascular:     Rate and Rhythm: Normal rate. Rhythm irregular.     Heart sounds: No murmur heard. Pulmonary:     Effort: Pulmonary effort is normal.     Breath sounds: No wheezing, rhonchi or rales.  Abdominal:     General: Bowel sounds are normal.     Palpations: Abdomen is soft.     Tenderness: There is no abdominal tenderness.  Musculoskeletal:        General: Deformity present. No tenderness.     Cervical back: Normal range of motion and neck supple.     Right lower leg: No edema.     Left lower leg: No edema.     Comments: Right elbow deformity and decreased ROM   Skin:    General: Skin is warm and dry.     Findings: Erythema and rash present.     Comments: Chronic perineal/buttocks skin irritation from moist and heat: dark reddened buttocks from previous examination   Neurological:     General: No focal deficit present.     Mental Status: He is alert and oriented to person, place, and time. Mental status is at baseline.     Gait: Gait abnormal.     Comments: W/c for mobility.   Psychiatric:        Mood and Affect: Mood normal.        Behavior: Behavior normal.        Thought Content: Thought content normal.     Labs reviewed: Recent Labs    06/23/23 1142 07/07/23 0000 10/19/23 0000  NA 140 140 138  K 3.6 3.9 4.5  CL 102 104 104  CO2 20 27* 24*  GLUCOSE 107*  --   --   BUN 15 22* 19  CREATININE 0.56* 0.5* 0.5*  CALCIUM  8.6 8.9 8.9   Recent Labs    02/15/23 0000 05/10/23 0000 06/23/23 1142  AST 25 25 37  ALT 20 22 32  ALKPHOS 134* 129* 171*  BILITOT  --   --  1.0  PROT  --   --   6.0  ALBUMIN 3.5 3.3* 3.1*   Recent Labs    05/10/23 0000 06/23/23 1142 10/19/23 0000  WBC 9.0 11.0* 7.6  NEUTROABS 7,344.00  --  5,290.00  HGB 12.3* 13.2 13.3*  HCT 37* 40.7 41  MCV  --  89  --   PLT 249 309 286   Lab Results  Component Value Date   TSH 2.039 11/02/2021   Lab Results  Component Value Date   HGBA1C 5.6 11/12/2022   Lab Results  Component Value Date   CHOL 79 11/12/2022   HDL 33 (A) 11/12/2022   LDLCALC 30 11/12/2022   LDLDIRECT 72.0 09/27/2017   TRIG 80 11/12/2022   CHOLHDL 2 10/14/2021    Significant Diagnostic Results in last 30 days:  No results found.  Assessment/Plan Neuropathy, peripheral decreased gabapentin  300 mg twice daily/3 times daily due to complaining of sleepiness  Major neurocognitive disorder (HCC)  MMSE 19/30  05/20/23, SNF [facility] for care needs.   Essential hypertension Blood pressure is controlled, on Metoprolol   Hyperlipidemia  off Atorvastatin  recommended by Cardiology 06/28/23, LDL 30 11/12/22  GERD Stable, no choking episodes, thing liquid, hx of dilatation,  takes Pantoprazole , Hgb 13.3 on 10/19/2023  Paroxysmal atrial fibrillation (HCC) resumed Eliquis  and dc'd ASA/Plavix  03/2023 per cardiology, on  Metoprolol , EKG 08/08/23 Afib with slow HR 50s.   Congestive heart failure (CHF) (HCC) no apparent swelling BLE, SOB, placed on Furosemide  in setting of weight gained, improved, followed by Cardiology, BNP 231/7 06/23/23  History of pulmonary embolism PE 11/02/21, had CTA 05/12/22, 09/08/22 Cardiology dc Eliquis , then resumed 03/2023 and off ASA and Plavix  03/2023 in setting of Afib.  Repeated CT @ 6 mos,  negative PE  CAD S/P percutaneous coronary angioplasty  ST elevation MI LAD 05/2021, underwent Cath, stenting, s/p percutaneous coronary angioplasty, started DAPT, Echo EF 40-45%,  65-70% 11/02/21. On Eliquis  only now.      Family/ staff Communication: Plan of care reviewed with the patient and charge nurse  Labs/tests  ordered: None

## 2023-11-08 NOTE — Assessment & Plan Note (Signed)
 ST elevation MI LAD 05/2021, underwent Cath, stenting, s/p percutaneous coronary angioplasty, started DAPT, Echo EF 40-45%,  65-70% 11/02/21. On Eliquis  only now.

## 2023-11-08 NOTE — Assessment & Plan Note (Signed)
 decreased gabapentin  300 mg twice daily/3 times daily due to complaining of sleepiness

## 2023-11-08 NOTE — Assessment & Plan Note (Signed)
 PE 11/02/21, had CTA 05/12/22, 09/08/22 Cardiology dc Eliquis, then resumed 03/2023 and off ASA and Plavix 03/2023 in setting of Afib.  Repeated CT @ 6 mos,  negative PE

## 2023-11-08 NOTE — Assessment & Plan Note (Signed)
 MMSE 19/30 05/20/23, SNF [facility] for care needs.

## 2023-11-08 NOTE — Assessment & Plan Note (Signed)
 off Atorvastatin  recommended by Cardiology 06/28/23, LDL 30 11/12/22

## 2023-11-10 DIAGNOSIS — R29898 Other symptoms and signs involving the musculoskeletal system: Secondary | ICD-10-CM | POA: Diagnosis not present

## 2023-11-14 DIAGNOSIS — R29898 Other symptoms and signs involving the musculoskeletal system: Secondary | ICD-10-CM | POA: Diagnosis not present

## 2023-11-15 DIAGNOSIS — R29898 Other symptoms and signs involving the musculoskeletal system: Secondary | ICD-10-CM | POA: Diagnosis not present

## 2023-11-16 ENCOUNTER — Non-Acute Institutional Stay (SKILLED_NURSING_FACILITY): Payer: Self-pay | Admitting: Orthopedic Surgery

## 2023-11-16 ENCOUNTER — Encounter: Payer: Self-pay | Admitting: Orthopedic Surgery

## 2023-11-16 DIAGNOSIS — R29898 Other symptoms and signs involving the musculoskeletal system: Secondary | ICD-10-CM | POA: Diagnosis not present

## 2023-11-16 DIAGNOSIS — F4321 Adjustment disorder with depressed mood: Secondary | ICD-10-CM

## 2023-11-16 MED ORDER — HYDROXYZINE PAMOATE 25 MG PO CAPS: 25.0000 mg | ORAL_CAPSULE | Freq: Every day | ORAL | Status: DC | PRN

## 2023-11-16 MED ORDER — SERTRALINE HCL 25 MG PO TABS: 25.0000 mg | ORAL_TABLET | Freq: Every day | ORAL | Status: DC
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
## 2023-11-17 DIAGNOSIS — I10 Essential (primary) hypertension: Secondary | ICD-10-CM | POA: Diagnosis not present

## 2023-11-17 DIAGNOSIS — R300 Dysuria: Secondary | ICD-10-CM | POA: Diagnosis not present

## 2023-11-21 ENCOUNTER — Encounter: Payer: Self-pay | Admitting: Nurse Practitioner

## 2023-11-21 DIAGNOSIS — E790 Hyperuricemia without signs of inflammatory arthritis and tophaceous disease: Secondary | ICD-10-CM | POA: Insufficient documentation

## 2023-11-22 ENCOUNTER — Encounter: Payer: Self-pay | Admitting: Nurse Practitioner

## 2023-11-22 DIAGNOSIS — N390 Urinary tract infection, site not specified: Secondary | ICD-10-CM | POA: Insufficient documentation

## 2023-11-24 DIAGNOSIS — R29898 Other symptoms and signs involving the musculoskeletal system: Secondary | ICD-10-CM | POA: Diagnosis not present

## 2023-11-24 DIAGNOSIS — I10 Essential (primary) hypertension: Secondary | ICD-10-CM | POA: Diagnosis not present

## 2023-11-24 LAB — BASIC METABOLIC PANEL WITH GFR
BUN: 15 (ref 4–21)
CO2: 27 — AB (ref 13–22)
Chloride: 103 (ref 99–108)
Creatinine: 0.7 (ref 0.6–1.3)
Glucose: 101
Potassium: 3.3 meq/L — AB (ref 3.5–5.1)
Sodium: 139 (ref 137–147)

## 2023-11-24 LAB — COMPREHENSIVE METABOLIC PANEL WITH GFR: Calcium: 8.7 (ref 8.7–10.7)

## 2023-11-29 ENCOUNTER — Encounter: Payer: Self-pay | Admitting: Nurse Practitioner

## 2023-11-29 ENCOUNTER — Non-Acute Institutional Stay (SKILLED_NURSING_FACILITY): Payer: Self-pay | Admitting: Nurse Practitioner

## 2023-11-29 ENCOUNTER — Other Ambulatory Visit: Payer: Self-pay

## 2023-11-29 ENCOUNTER — Emergency Department (HOSPITAL_COMMUNITY)
Admission: EM | Admit: 2023-11-29 | Discharge: 2023-11-29 | Disposition: A | Discharge status: Home / Self Care | Source: Skilled Nursing Facility | Attending: Emergency Medicine | Admitting: Emergency Medicine

## 2023-11-29 ENCOUNTER — Encounter (HOSPITAL_COMMUNITY): Payer: Self-pay

## 2023-11-29 DIAGNOSIS — I480 Paroxysmal atrial fibrillation: Secondary | ICD-10-CM | POA: Diagnosis not present

## 2023-11-29 DIAGNOSIS — E860 Dehydration: Secondary | ICD-10-CM | POA: Diagnosis not present

## 2023-11-29 DIAGNOSIS — M79603 Pain in arm, unspecified: Secondary | ICD-10-CM | POA: Diagnosis not present

## 2023-11-29 DIAGNOSIS — Z86711 Personal history of pulmonary embolism: Secondary | ICD-10-CM | POA: Diagnosis not present

## 2023-11-29 DIAGNOSIS — R1310 Dysphagia, unspecified: Secondary | ICD-10-CM | POA: Diagnosis not present

## 2023-11-29 DIAGNOSIS — I2510 Atherosclerotic heart disease of native coronary artery without angina pectoris: Secondary | ICD-10-CM

## 2023-11-29 DIAGNOSIS — R400 Somnolence: Secondary | ICD-10-CM

## 2023-11-29 DIAGNOSIS — L309 Dermatitis, unspecified: Secondary | ICD-10-CM | POA: Diagnosis not present

## 2023-11-29 DIAGNOSIS — I5043 Acute on chronic combined systolic (congestive) and diastolic (congestive) heart failure: Secondary | ICD-10-CM | POA: Diagnosis not present

## 2023-11-29 DIAGNOSIS — F419 Anxiety disorder, unspecified: Secondary | ICD-10-CM | POA: Diagnosis not present

## 2023-11-29 DIAGNOSIS — Z7401 Bed confinement status: Secondary | ICD-10-CM | POA: Diagnosis not present

## 2023-11-29 DIAGNOSIS — K219 Gastro-esophageal reflux disease without esophagitis: Secondary | ICD-10-CM | POA: Diagnosis not present

## 2023-11-29 DIAGNOSIS — Z9861 Coronary angioplasty status: Secondary | ICD-10-CM | POA: Diagnosis not present

## 2023-11-29 DIAGNOSIS — Z7901 Long term (current) use of anticoagulants: Secondary | ICD-10-CM | POA: Diagnosis not present

## 2023-11-29 DIAGNOSIS — I10 Essential (primary) hypertension: Secondary | ICD-10-CM | POA: Diagnosis not present

## 2023-11-29 DIAGNOSIS — G609 Hereditary and idiopathic neuropathy, unspecified: Secondary | ICD-10-CM | POA: Diagnosis not present

## 2023-11-29 DIAGNOSIS — R4182 Altered mental status, unspecified: Secondary | ICD-10-CM | POA: Insufficient documentation

## 2023-11-29 LAB — CBC WITH DIFFERENTIAL/PLATELET
Abs Immature Granulocytes: 0.07 K/uL (ref 0.00–0.07)
Basophils Absolute: 0.1 K/uL (ref 0.0–0.1)
Basophils Relative: 1 %
Eosinophils Absolute: 0.1 K/uL (ref 0.0–0.5)
Eosinophils Relative: 1 %
HCT: 44.1 % (ref 39.0–52.0)
Hemoglobin: 14.3 g/dL (ref 13.0–17.0)
Immature Granulocytes: 1 %
Lymphocytes Relative: 15 %
Lymphs Abs: 1.6 K/uL (ref 0.7–4.0)
MCH: 29.6 pg (ref 26.0–34.0)
MCHC: 32.4 g/dL (ref 30.0–36.0)
MCV: 91.3 fL (ref 80.0–100.0)
Monocytes Absolute: 0.8 K/uL (ref 0.1–1.0)
Monocytes Relative: 7 %
Neutro Abs: 8.2 K/uL — ABNORMAL HIGH (ref 1.7–7.7)
Neutrophils Relative %: 75 %
Platelets: 285 K/uL (ref 150–400)
RBC: 4.83 MIL/uL (ref 4.22–5.81)
RDW: 15.1 % (ref 11.5–15.5)
WBC: 10.8 K/uL — ABNORMAL HIGH (ref 4.0–10.5)
nRBC: 0 % (ref 0.0–0.2)

## 2023-11-29 LAB — COMPREHENSIVE METABOLIC PANEL WITH GFR
ALT: 21 U/L (ref 0–44)
AST: 26 U/L (ref 15–41)
Albumin: 2.5 g/dL — ABNORMAL LOW (ref 3.5–5.0)
Alkaline Phosphatase: 122 U/L (ref 38–126)
Anion gap: 11 (ref 5–15)
BUN: 21 mg/dL (ref 8–23)
CO2: 24 mmol/L (ref 22–32)
Calcium: 8.7 mg/dL — ABNORMAL LOW (ref 8.9–10.3)
Chloride: 103 mmol/L (ref 98–111)
Creatinine, Ser: 0.76 mg/dL (ref 0.61–1.24)
GFR, Estimated: >60 mL/min (ref >60)
Glucose, Bld: 124 mg/dL — ABNORMAL HIGH (ref 70–99)
Potassium: 4 mmol/L (ref 3.5–5.1)
Sodium: 138 mmol/L (ref 135–145)
Total Bilirubin: 1.1 mg/dL (ref 0.0–1.2)
Total Protein: 7 g/dL (ref 6.5–8.1)

## 2023-11-29 LAB — LIPASE, BLOOD: Lipase: 64 U/L — ABNORMAL HIGH (ref 11–51)

## 2023-11-29 MED ORDER — SODIUM CHLORIDE 0.9 % IV BOLUS
1000.0000 mL | Freq: Once | INTRAVENOUS | Status: AC
  Administered 2023-11-29: 1000 mL via INTRAVENOUS

## 2023-11-29 NOTE — Assessment & Plan Note (Signed)
 failed effexor, self discontinued Lexapro, failed Sertraline . Occasional irritable.

## 2023-11-29 NOTE — Assessment & Plan Note (Signed)
 no apparent swelling BLE, SOB, placed on Furosemide  in setting of weight gained, improved, followed by Cardiology, BNP 231/7 06/23/23 Discontinue furosemide /potassium chloride  in setting of decreased oral intake, dry skin, no edema, and lethargy

## 2023-11-29 NOTE — Progress Notes (Signed)
 Location:   [facility] Room Number: 31 Place of Service:  SNF (31) Provider: [facility] Marcelle Bebout NP  Johnny Garnette LABOR, MD  Patient Care Team: Johnny Garnette LABOR, MD as PCP - General (Family Medicine) Kate Lonni CROME, MD as PCP - Cardiology (Cardiology) Octavia Charleston, MD as Consulting Physician (Ophthalmology) Elner Arley LABOR, MD as Consulting Physician (Ophthalmology) Duke, Jon Garre, PA as Physician Assistant (Cardiology)  Extended Emergency Contact Information Primary Emergency Contact: Garrett,Cathy  [place] Home Phone: [phone] Mobile Phone: [phone] Relation: Daughter Secondary Emergency Contact: Leia Pulling  [place] Home Phone: [phone] Relation: Daughter  Code Status: DNR Goals of care: Advanced Directive information    11/16/2023   11:30 AM  Advanced Directives  Does Patient Have a Medical Advance Directive? Yes  Type of Estate agent of [place];Living will  Does patient want to make changes to medical advance directive? No - Patient declined  Copy of Healthcare Power of Attorney in Chart? Yes - validated most recent copy scanned in chart (See row information)     Chief Complaint  Patient presents with   Acute Visit    lethargy    HPI:  Pt is a 89 y.o. male seen today for an acute visit for lethargy, sleepiness, but alert, able to answer questions appropriately.  The patient denied headache, change of vision, chest pain, shortness of breath, palpitation, nausea, vomiting, ABD pain, and dysuria.  The patient is afebrile, no O2 desaturation, and no noted focal neurological deficit.  The patient desires to stop sertraline  started 11/16/2023.  Chronic dermatitis buttocks, followed by dermatology, presently: Mometasone, Fluocinonide , Nystatin  Dysphagia, choked in the past, chopped meat with thin fluid presently. Underwent GI evaluation, last dilatation 2022,  CXR normal 03/07/23              Anxiety, failed effexor, self discontinued Lexapro, failed Sertraline . Occasional irritable.             The right arm pain, more at night, Tylenol  at night helped               03/24/23 right elbow fx(distal end of right humerus), healed with deformity and limited ROM, has been to surgeon-non surgical candidate.             CAD ST elevation MI LAD 05/2021, underwent Cath, stenting, s/p percutaneous coronary angioplasty, started DAPT, Echo EF 40-45%,  65-70% 11/02/21. On Eliquis  only now.              PE 11/02/21, had CTA 05/12/22, 09/08/22 Cardiology dc Eliquis , then resumed 03/2023 and off ASA and Plavix  03/2023 in setting of Afib.  Repeated CT @ 6 mos,  negative PE             Grade 2 diastolic dysfunction, no apparent swelling BLE, SOB, placed on Furosemide  in setting of weight gained, improved, followed by Cardiology, BNP 231/7 06/23/23             Afib, resumed Eliquis  and dc'd ASA/Plavix  03/2023 per cardiology, on  Metoprolol , EKG 08/08/23 Afib with slow HR 50s.              GERD, thing liquid, hx of dilatation,  takes Pantoprazole , Hgb 13.3 on 10/19/2023             Hypokalemia, on Kcl K 4.5 on 10/19/2023             Hyponatremia, Na 138 on 10/19/2023  HLD off Atorvastatin  recommended by Cardiology 06/28/23, LDL 30 11/12/22             HTN, on Metoprolol              Peripheral neuropathy, decreased gabapentin  300 mg twice daily/3 times daily due to complaining of sleepiness             Cognitive impairment: MMSE 19/30 05/20/23, SNF [facility] for care needs.        Past Medical History:  Diagnosis Date   Arthritis    Diverticulosis of colon    External thrombosed hemorrhoids 06/11/2009   Qualifier: Diagnosis of  By: Johnny MD, Garnette LABOR    GERD (gastroesophageal reflux disease)    Hypertension    Past Surgical History:  Procedure Laterality Date   cataract extaction, right  6/09   per Dr. Lorrene   CATARACT EXTRACTION Left    COLONOSCOPY  09/23/04   per Dr. Nolon, clear, no repeats needed     CORONARY STENT INTERVENTION N/A 06/06/2021   Procedure: CORONARY STENT INTERVENTION;  Surgeon: Mady Bruckner, MD;  Location: [facility];  Service: Cardiovascular;  Laterality: N/A;   CORONARY/GRAFT ACUTE MI REVASCULARIZATION N/A 06/06/2021   Procedure: Coronary/Graft Acute MI Revascularization;  Surgeon: Mady Bruckner, MD;  Location: [facility];  Service: Cardiovascular;  Laterality: N/A;   EGD and esophageal diatation,  05-10-11   per Dr. Teressa     HERNIA REPAIR     LEFT HEART CATH AND CORONARY ANGIOGRAPHY N/A 06/06/2021   Procedure: LEFT HEART CATH AND CORONARY ANGIOGRAPHY;  Surgeon: Mady Bruckner, MD;  Location: [facility];  Service: Cardiovascular;  Laterality: N/A;   rt inguinal hernia     x2    Allergies  Allergen Reactions   Effexor [Venlafaxine]     Pt became lethargic, drowsy and not his usual self with effexor.   Contrast Media [Iodinated Contrast Media] Rash    Allergies as of 11/29/2023       Reactions   Effexor [venlafaxine]    Pt became lethargic, drowsy and not his usual self with effexor.   Contrast Media [iodinated Contrast Media] Rash        Medication List        Accurate as of November 29, 2023  4:03 PM. If you have any questions, ask your nurse or doctor.          acetaminophen  500 MG tablet Commonly known as: TYLENOL  Take 1,000 mg by mouth every 8 (eight) hours as needed.   antiseptic oral rinse Liqd 15 mLs by Mouth Rinse route daily.   apixaban  5 MG Tabs tablet Commonly known as: ELIQUIS  Take 1 tablet (5 mg total) by mouth 2 (two) times daily.   artificial tears ophthalmic solution Place 2 drops into the right eye 2 (two) times daily.   Calmoseptine 0.44-20.6 % Oint Generic drug: Menthol-Zinc Oxide Apply 1 Application topically 2 (two) times daily. To buttocks   CeraVe Itch Relief 1 % Lotn Generic drug: pramoxine Apply 1 Application topically 2 (two) times daily.   clotrimazole -betamethasone   cream Commonly known as: LOTRISONE  Apply 1 Application topically 2 (two) times daily as needed. Fax#[phone]   docusate sodium 100 MG capsule Commonly known as: COLACE Take 100 mg by mouth 2 (two) times daily.   fluocinonide  cream 0.05 % Commonly known as: LIDEX  Apply 1 Application topically 2 (two) times daily.   fluticasone  50 MCG/ACT nasal spray Commonly known as: FLONASE  Place 2 sprays into  both nostrils daily.   furosemide  40 MG tablet Commonly known as: LASIX  Take 40 mg by mouth daily.   furosemide  40 MG tablet Commonly known as: LASIX  Take 1.5 pills daily for 3 days then resume 40 mg daily.   gabapentin  300 MG capsule Commonly known as: NEURONTIN  Take 300 mg by mouth 2 (two) times daily.   gabapentin  300 MG capsule Commonly known as: NEURONTIN  TAKE 1 CAPSULE BY MOUTH THREE TIMES A DAY   guaiFENesin  600 MG 12 hr tablet Commonly known as: MUCINEX  Take 1,200 mg by mouth 2 (two) times daily.   hydrOXYzine  25 MG capsule Commonly known as: VISTARIL  Take 1 capsule (25 mg total) by mouth daily as needed. Give for agitation   lactose free nutrition Liqd Take 237 mLs by mouth 3 (three) times daily.   LORazepam 0.5 MG tablet Commonly known as: ATIVAN Take 0.5 mg by mouth as directed. Give 1 tablet by mouth one time only for agitation for 1 day may repeat in 1-2 hours if no improvement in behavior.   metoprolol  succinate 25 MG 24 hr tablet Commonly known as: Toprol  XL Take 0.5 tablets (12.5 mg total) by mouth in the morning and at bedtime.   miconazole 2 % powder Commonly known as: MICOTIN Apply 1 Application topically every 8 (eight) hours as needed for itching.   mometasone 0.1 % cream Commonly known as: ELOCON Apply 1 Application topically daily as needed.   nitroGLYCERIN  0.4 MG SL tablet Commonly known as: Nitrostat  Place 1 tablet (0.4 mg total) under the tongue every 5 (five) minutes as needed for chest pain.   nystatin  powder Commonly known as:  MYCOSTATIN /NYSTOP  Apply 1 Application topically 3 (three) times daily.   pantoprazole  40 MG tablet Commonly known as: PROTONIX  Take 40 mg by mouth daily as needed.   potassium chloride  SA 20 MEQ tablet Commonly known as: KLOR-CON  M Take 1 tablet (20 mEq total) by mouth daily.   sertraline  25 MG tablet Commonly known as: ZOLOFT  Take 1 tablet (25 mg total) by mouth daily.        Review of Systems  Constitutional:  Positive for fatigue. Negative for appetite change and fever.  HENT:  Positive for hearing loss and trouble swallowing. Negative for congestion.   Eyes:  Negative for visual disturbance.  Respiratory:  Positive for cough. Negative for chest tightness, shortness of breath and wheezing.   Cardiovascular:  Negative for leg swelling.  Gastrointestinal:  Negative for abdominal pain and constipation.  Genitourinary:  Negative for dysuria, frequency and urgency.       Incontinent of urine  Musculoskeletal:  Positive for arthralgias and gait problem.       R arm pain.   Skin:  Positive for rash.  Neurological:  Negative for speech difficulty, weakness and headaches.  Psychiatric/Behavioral:  Negative for confusion, self-injury and sleep disturbance. The patient is not nervous/anxious.     Immunization History  Administered Date(s) Administered   Fluad Quad(high Dose 65+) 02/15/2022, 02/08/2023   Influenza Split 01/20/2012, 01/07/2013   Influenza Whole 02/09/2006, 01/11/2008   Influenza, High Dose Seasonal PF 12/30/2016, 12/27/2017, 01/08/2019   Influenza,inj,quad, With Preservative 12/29/2018   Influenza-Unspecified 01/17/2014, 01/14/2016, 12/30/2016   Moderna Covid-19 Fall Seasonal Vaccine 46yrs & older 03/22/2022, 02/09/2023   PFIZER Comirnaty(Gray Top)Covid-19 Tri-Sucrose Vaccine 05/28/2019, 03/04/2020, 09/23/2020   Pfizer Covid-19 Vaccine Bivalent Booster 58yrs & up 02/25/2022   Pneumococcal Conjugate-13 09/23/2015   Pneumococcal Polysaccharide-23 03/13/2008   Td  12/20/2007   Tdap 07/06/2021  Unspecified SARS-COV-2 Vaccination 04/30/2019   Zoster Recombinant(Shingrix) 03/11/2020, 06/20/2020   Pertinent  Health Maintenance Due  Topic Date Due   INFLUENZA VACCINE  11/25/2023   FOOT EXAM  Discontinued   HEMOGLOBIN A1C  Discontinued   OPHTHALMOLOGY EXAM  Discontinued      05/04/2022    2:44 PM 06/01/2022   11:04 AM 06/15/2022    9:14 AM 06/23/2022    3:12 PM 10/19/2022   11:19 AM  Fall Risk  Falls in the past year? 1 1 1 1  Exclusion - non ambulatory  Was there an injury with Fall? 1 1 1 1    Was there an injury with Fall? - Comments    Fx Rt Ekbow. Followed by Orthopedic   Fall Risk Category Calculator 3 3 3 2    Fall Risk Category (Retired) High       (RETIRED) Patient Fall Risk Level High fall risk       Patient at Risk for Falls Due to History of fall(s);Impaired balance/gait;Impaired mobility;Impaired vision History of fall(s);Impaired balance/gait;Impaired mobility;Impaired vision History of fall(s);Impaired balance/gait;Impaired mobility Orthopedic patient   Fall risk Follow up Falls evaluation completed  Falls evaluation completed Falls evaluation completed Falls prevention discussed Falls evaluation completed     Data saved with a previous flowsheet row definition   Functional Status Survey:    Vitals:   11/29/23 1539  BP: 116/67  Pulse: 80  Resp: 18  Temp: 97.9 F (36.6 C)  SpO2: 98%   There is no height or weight on file to calculate BMI. Physical Exam Vitals and nursing note reviewed.  Constitutional:      Comments: Lethargic/sleepiness, but able to answer questions appropriately  HENT:     Head: Normocephalic.     Comments:      Nose: Nose normal. No congestion.     Mouth/Throat:     Mouth: Mucous membranes are moist.  Eyes:     Extraocular Movements: Extraocular movements intact.     Conjunctiva/sclera: Conjunctivae normal.     Pupils: Pupils are equal, round, and reactive to light.  Cardiovascular:     Rate and  Rhythm: Normal rate. Rhythm irregular.     Heart sounds: No murmur heard. Pulmonary:     Effort: Pulmonary effort is normal.     Breath sounds: No wheezing, rhonchi or rales.  Abdominal:     General: Bowel sounds are normal.     Palpations: Abdomen is soft.     Tenderness: There is no abdominal tenderness.  Musculoskeletal:        General: Deformity present. No tenderness.     Cervical back: Normal range of motion and neck supple.     Right lower leg: No edema.     Left lower leg: No edema.     Comments: Right elbow deformity and decreased ROM   Skin:    General: Skin is warm and dry.     Findings: Erythema and rash present.     Comments: Chronic perineal/buttocks skin irritation from moist and heat: dark reddened buttocks from previous examination   Neurological:     General: No focal deficit present.     Mental Status: He is alert and oriented to person, place, and time. Mental status is at baseline.     Gait: Gait abnormal.     Comments: W/c for mobility.   Psychiatric:        Mood and Affect: Mood normal.        Behavior: Behavior normal.  Thought Content: Thought content normal.     Labs reviewed: Recent Labs    06/23/23 1142 07/07/23 0000 10/19/23 0000  NA 140 140 138  K 3.6 3.9 4.5  CL 102 104 104  CO2 20 27* 24*  GLUCOSE 107*  --   --   BUN 15 22* 19  CREATININE 0.56* 0.5* 0.5*  CALCIUM  8.6 8.9 8.9   Recent Labs    02/15/23 0000 05/10/23 0000 06/23/23 1142  AST 25 25 37  ALT 20 22 32  ALKPHOS 134* 129* 171*  BILITOT  --   --  1.0  PROT  --   --  6.0  ALBUMIN 3.5 3.3* 3.1*   Recent Labs    05/10/23 0000 06/23/23 1142 10/19/23 0000  WBC 9.0 11.0* 7.6  NEUTROABS 7,344.00  --  5,290.00  HGB 12.3* 13.2 13.3*  HCT 37* 40.7 41  MCV  --  89  --   PLT 249 309 286   Lab Results  Component Value Date   TSH 2.039 11/02/2021   Lab Results  Component Value Date   HGBA1C 5.6 11/12/2022   Lab Results  Component Value Date   CHOL 79  11/12/2022   HDL 33 (A) 11/12/2022   LDLCALC 30 11/12/2022   LDLDIRECT 72.0 09/27/2017   TRIG 80 11/12/2022   CHOLHDL 2 10/14/2021    Significant Diagnostic Results in last 30 days:  No results found.  Assessment/Plan: Altered mental status lethargy, sleepiness, but alert, able to answer questions appropriately.  The patient denied headache, change of vision, chest pain, shortness of breath, palpitation, nausea, vomiting, ABD pain, and dysuria.  The patient is afebrile, no O2 desaturation, and no noted focal neurological deficit.  The patient desires to stop sertraline  started 11/16/2023. Hold starting mirtazapine until the patient's drowsiness/lethargy/sleepiness resolved The patient declined PT/hospital evaluation and treatment Will start D5 half-normal saline at 75 cc/h for 2000 mL  Stat CBC/differential, CMP/eGFR DC furosemide /potassium chloride  Vital signs and neurocheck every shift for 72 hours  Dermatitis Chronic dermatitis buttocks, followed by dermatology, presently: Mometasone, Fluocinonide , Nystatin   Dysphagia choked in the past, chopped meat with thin fluid presently. Underwent GI evaluation, last dilatation 2022,  CXR normal 03/07/23  Anxiety failed effexor, self discontinued Lexapro, failed Sertraline . Occasional irritable.  CAD S/P percutaneous coronary angioplasty ST elevation MI LAD 05/2021, underwent Cath, stenting, s/p percutaneous coronary angioplasty, started DAPT, Echo EF 40-45%,  65-70% 11/02/21. On Eliquis  only now.   History of pulmonary embolism PE 11/02/21, had CTA 05/12/22, 09/08/22 Cardiology dc Eliquis , then resumed 03/2023 and off ASA and Plavix  03/2023 in setting of Afib.  Repeated CT @ 6 mos,  negative PE  Congestive heart failure (CHF) (HCC) no apparent swelling BLE, SOB, placed on Furosemide  in setting of weight gained, improved, followed by Cardiology, BNP 231/7 06/23/23 Discontinue furosemide /potassium chloride  in setting of decreased oral intake,  dry skin, no edema, and lethargy  Paroxysmal atrial fibrillation (HCC)  resumed Eliquis  and dc'd ASA/Plavix  03/2023 per cardiology, on  Metoprolol , EKG 08/08/23 Afib with slow HR 50s.  Heart rate in 80s today  GERD  thing liquid, hx of dilatation,  takes Pantoprazole , Hgb 13.3 on 10/19/2023  Essential hypertension Blood pressure controlled, on Metoprolol   Neuropathy, peripheral  decreased gabapentin  300 mg daily/twice daily/3 times daily due to complaining of sleepiness    Family/ staff Communication: Plan of care reviewed with the patient, social worker, and charge nurse  Labs/tests ordered: CBC/differential, CMP/eGFR

## 2023-11-29 NOTE — ED Notes (Signed)
 [facility] called for transportation back to [facility] Paperwork printed.

## 2023-11-29 NOTE — Assessment & Plan Note (Signed)
 choked in the past, chopped meat with thin fluid presently. Underwent GI evaluation, last dilatation 2022,  CXR normal 03/07/23

## 2023-11-29 NOTE — Assessment & Plan Note (Signed)
 ST elevation MI LAD 05/2021, underwent Cath, stenting, s/p percutaneous coronary angioplasty, started DAPT, Echo EF 40-45%,  65-70% 11/02/21. On Eliquis  only now.

## 2023-11-29 NOTE — Assessment & Plan Note (Signed)
 decreased gabapentin  300 mg daily/twice daily/3 times daily due to complaining of sleepiness

## 2023-11-29 NOTE — Assessment & Plan Note (Signed)
 Blood pressure controlled, on Metoprolol 

## 2023-11-29 NOTE — ED Triage Notes (Signed)
 Pt BIB ems for inability to swallow properly. 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 Pt has had surgery to dilate the esophagus several times. PO intake decreased for the past few days, dehydrated. On a thin diet for years. Hx of Afib, A&Ox4

## 2023-11-29 NOTE — Discharge Instructions (Signed)
 Call the GI doc office in the morning so you can have a discussion with them about your difficulty with swallowing.

## 2023-11-29 NOTE — Assessment & Plan Note (Signed)
 thing liquid, hx of dilatation,  takes Pantoprazole , Hgb 13.3 on 10/19/2023

## 2023-11-29 NOTE — ED Provider Notes (Signed)
 [place] EMERGENCY DEPARTMENT AT [facility] Provider Note   CSN: [id number] Arrival date & time: 11/29/23  1924     Patient presents with: Dysphagia   Gabriel [name] is a 89 y.o. male.   89 yo M with a chief complaint of difficulty swallowing.  This been an ongoing issue for him.  He tells me he has had his esophagus stretched twice.  He was told last time it was stretched that he was too old and he would not be able to do it anymore.  He has progressively had worsening difficulties eating.  Is able to drink liquids but has to eat very soft mashed foods.  Is able to take his medications crushed.  He has been having some phlegm after drinking recently.  He has not really wanted to eat and drink for the past couple weeks.  Was seen by his provider at the nursing facility today, at that time he had refused transport to the ED.  Was then sent to change of shift by the oncoming charge nurse for dehydration with a request for IV fluids.          Prior to Admission medications   Medication Sig Start Date End Date Taking? Authorizing Provider  acetaminophen  (TYLENOL ) 500 MG tablet Take 1,000 mg by mouth every 8 (eight) hours as needed.    [provider]  antiseptic oral rinse (BIOTENE) LIQD 15 mLs by Mouth Rinse route daily.    [provider]  apixaban  (ELIQUIS ) 5 MG TABS tablet Take 1 tablet (5 mg total) by mouth 2 (two) times daily. 04/12/23   Burnard Debby LABOR, MD  clotrimazole -betamethasone  (LOTRISONE ) cream Apply 1 Application topically 2 (two) times daily as needed. Fax#[phone] 01/20/23   Johnny Garnette LABOR, MD  docusate sodium (COLACE) 100 MG capsule Take 100 mg by mouth 2 (two) times daily.    [provider]  fluocinonide  cream (LIDEX ) 0.05 % Apply 1 Application topically 2 (two) times daily. 05/05/23   Paci, Karina M, MD  fluticasone  (FLONASE ) 50 MCG/ACT nasal spray Place 2 sprays into both nostrils daily. 04/23/23   [provider]   furosemide  (LASIX ) 40 MG tablet Take 1.5 pills daily for 3 days then resume 40 mg daily. 05/12/23   Duke, Jon Garre, PA  furosemide  (LASIX ) 40 MG tablet Take 40 mg by mouth daily.    [provider]  gabapentin  (NEURONTIN ) 300 MG capsule TAKE 1 CAPSULE BY MOUTH THREE TIMES A DAY 02/11/21   Johnny Garnette LABOR, MD  gabapentin  (NEURONTIN ) 300 MG capsule Take 300 mg by mouth 2 (two) times daily.    [provider]  guaiFENesin  (MUCINEX ) 600 MG 12 hr tablet Take 1,200 mg by mouth 2 (two) times daily.    [provider]  hydrOXYzine  (VISTARIL ) 25 MG capsule Take 1 capsule (25 mg total) by mouth daily as needed. Give for agitation 11/16/23   Fargo, Amy E, NP  lactose free nutrition (BOOST) LIQD Take 237 mLs by mouth 3 (three) times daily.    [provider]  LORazepam (ATIVAN) 0.5 MG tablet Take 0.5 mg by mouth as directed. Give 1 tablet by mouth one time only for agitation for 1 day may repeat in 1-2 hours if no improvement in behavior.    [provider]  Menthol-Zinc Oxide (CALMOSEPTINE) 0.44-20.6 % OINT Apply 1 Application topically 2 (two) times daily. To buttocks    [provider]  metoprolol  succinate (TOPROL  XL) 25 MG 24 hr  tablet Take 0.5 tablets (12.5 mg total) by mouth in the morning and at bedtime. 04/12/23   Burnard Debby LABOR, MD  miconazole (MICOTIN) 2 % powder Apply 1 Application topically every 8 (eight) hours as needed for itching.    [provider]  mometasone (ELOCON) 0.1 % cream Apply 1 Application topically daily as needed. 02/04/23   [provider]  nitroGLYCERIN  (NITROSTAT ) 0.4 MG SL tablet Place 1 tablet (0.4 mg total) under the tongue every 5 (five) minutes as needed for chest pain. 06/10/21   Henry Manuelita NOVAK, NP  nystatin  (MYCOSTATIN /NYSTOP ) powder Apply 1 Application topically 3 (three) times daily. 05/05/23   Paci, Karina M, MD  pantoprazole  (PROTONIX ) 40 MG tablet Take 40 mg by mouth daily as needed.     [provider]  polyvinyl alcohol (LIQUIFILM TEARS) 1.4 % ophthalmic solution Place 2 drops into the right eye 2 (two) times daily.    [provider]  potassium chloride  SA (KLOR-CON  M) 20 MEQ tablet Take 1 tablet (20 mEq total) by mouth daily. 05/12/23   Duke, Jon Garre, PA  pramoxine (CERAVE ITCH RELIEF) 1 % LOTN Apply 1 Application topically 2 (two) times daily.    [provider]  sertraline  (ZOLOFT ) 25 MG tablet Take 1 tablet (25 mg total) by mouth daily. 11/16/23   Gil Greig BRAVO, NP    Allergies: Effexor [venlafaxine] and Contrast media [iodinated contrast media]    Review of Systems  Updated Vital Signs BP (!) 149/66   Pulse 63   Temp 98.4 F (36.9 C) (Oral)   Resp 17   Ht 5' 7 (1.702 m)   Wt 65.9 kg   SpO2 95%   BMI 22.74 kg/m   Physical Exam Vitals and nursing note reviewed.  Constitutional:      Appearance: He is well-developed.  HENT:     Head: Normocephalic and atraumatic.  Eyes:     Pupils: Pupils are equal, round, and reactive to light.  Neck:     Vascular: No JVD.  Cardiovascular:     Rate and Rhythm: Normal rate and regular rhythm.     Heart sounds: No murmur heard.    No friction rub. No gallop.  Pulmonary:     Effort: No respiratory distress.     Breath sounds: No wheezing.  Abdominal:     General: There is no distension.     Tenderness: There is no abdominal tenderness. There is no guarding or rebound.  Musculoskeletal:        General: Normal range of motion.     Cervical back: Normal range of motion and neck supple.  Skin:    Coloration: Skin is not pale.     Findings: No rash.  Neurological:     Mental Status: He is alert and oriented to person, place, and time.  Psychiatric:        Behavior: Behavior normal.     (all labs ordered are listed, but only abnormal results are displayed) Labs Reviewed  CBC WITH DIFFERENTIAL/PLATELET - Abnormal; Notable for the following components:      Result Value   WBC 10.8  (*)    Neutro Abs 8.2 (*)    All other components within normal limits  COMPREHENSIVE METABOLIC PANEL WITH GFR - Abnormal; Notable for the following components:   Glucose, Bld 124 (*)    Calcium  8.7 (*)    Albumin 2.5 (*)    All other components within normal limits  LIPASE, BLOOD - Abnormal;  Notable for the following components:   Lipase 64 (*)    All other components within normal limits    EKG: None  Radiology: No results found.   Procedures   Medications Ordered in the ED  sodium chloride  0.9 % bolus 1,000 mL (1,000 mLs Intravenous New Bag/Given 11/29/23 2031)                                    Medical Decision Making Amount and/or Complexity of Data Reviewed Labs: ordered.   89 yo M with a chief complaints of difficulty swallowing.  He tells me has been going on for years.  He has had some progression of it since has gotten to the point where he does not really feel like eating and drinking much anymore.  He is tired of eating mashed potatoes.  He refused it this evening and then was told he needed to come to the hospital to be evaluated.  I also discussed this with his daughter.  She had talked with the charge nurse this evening who had wanted him to get IV fluids.  Reportedly they did not have the equipment to give IV fluids.  Blood work without significant electrolyte abnormality.  No significant change to renal function.  No anemia.  10:14 PM:  I have discussed the diagnosis/risks/treatment options with the patient.  Evaluation and diagnostic testing in the emergency department does not suggest an emergent condition requiring admission or immediate intervention beyond what has been performed at this time.  They will follow up with PCP, GI. We also discussed returning to the ED immediately if new or worsening sx occur. We discussed the sx which are most concerning (e.g., sudden worsening pain, fever, inability to tolerate by mouth) that necessitate immediate return.  Medications administered to the patient during their visit and any new prescriptions provided to the patient are listed below.  Medications given during this visit Medications  sodium chloride  0.9 % bolus 1,000 mL (1,000 mLs Intravenous New Bag/Given 11/29/23 2031)     The patient appears reasonably screen and/or stabilized for discharge and I doubt any other medical condition or other [facility] requiring further screening, evaluation, or treatment in the ED at this time prior to discharge.       Final diagnoses:  Dysphagia, unspecified type    ED Discharge Orders     None          Emil Share, DO 11/29/23 2214

## 2023-11-29 NOTE — Assessment & Plan Note (Signed)
 lethargy, sleepiness, but alert, able to answer questions appropriately.  The patient denied headache, change of vision, chest pain, shortness of breath, palpitation, nausea, vomiting, ABD pain, and dysuria.  The patient is afebrile, no O2 desaturation, and no noted focal neurological deficit.  The patient desires to stop sertraline  started 11/16/2023. Hold starting mirtazapine until the patient's drowsiness/lethargy/sleepiness resolved The patient declined PT/hospital evaluation and treatment Will start D5 half-normal saline at 75 cc/h for 2000 mL  Stat CBC/differential, CMP/eGFR DC furosemide /potassium chloride  Vital signs and neurocheck every shift for 72 hours

## 2023-11-29 NOTE — Assessment & Plan Note (Signed)
 PE 11/02/21, had CTA 05/12/22, 09/08/22 Cardiology dc Eliquis, then resumed 03/2023 and off ASA and Plavix 03/2023 in setting of Afib.  Repeated CT @ 6 mos,  negative PE

## 2023-11-29 NOTE — Assessment & Plan Note (Signed)
 Chronic dermatitis buttocks, followed by dermatology, presently: Mometasone, Fluocinonide , Nystatin 

## 2023-11-29 NOTE — Assessment & Plan Note (Signed)
 resumed Eliquis  and dc'd ASA/Plavix  03/2023 per cardiology, on  Metoprolol , EKG 08/08/23 Afib with slow HR 50s.  Heart rate in 80s today

## 2023-11-30 ENCOUNTER — Telehealth: Payer: Self-pay | Admitting: Gastroenterology

## 2023-11-30 NOTE — Telephone Encounter (Signed)
 Pt scheduled to see Alan Coombs PA 12/02/23@2 :10pm. Pts dsughter aware of appt.

## 2023-11-30 NOTE — Telephone Encounter (Signed)
 Inbound call from patient's daughter stating patient was at the ED yesterday for dehydration due to Dysphagia. States patient has not been able to swallow any foods or liquids. Patient's daughter, Donny, is requesting a call back to discuss further at [phone] . Please advise, thank you

## 2023-12-01 ENCOUNTER — Encounter: Payer: Self-pay | Admitting: Nurse Practitioner

## 2023-12-01 ENCOUNTER — Non-Acute Institutional Stay (SKILLED_NURSING_FACILITY): Payer: Self-pay | Admitting: Nurse Practitioner

## 2023-12-01 DIAGNOSIS — I480 Paroxysmal atrial fibrillation: Secondary | ICD-10-CM | POA: Diagnosis not present

## 2023-12-01 DIAGNOSIS — F0390 Unspecified dementia without behavioral disturbance: Secondary | ICD-10-CM

## 2023-12-01 DIAGNOSIS — K219 Gastro-esophageal reflux disease without esophagitis: Secondary | ICD-10-CM | POA: Diagnosis not present

## 2023-12-01 DIAGNOSIS — R1310 Dysphagia, unspecified: Secondary | ICD-10-CM | POA: Diagnosis not present

## 2023-12-01 DIAGNOSIS — F419 Anxiety disorder, unspecified: Secondary | ICD-10-CM

## 2023-12-01 DIAGNOSIS — I5043 Acute on chronic combined systolic (congestive) and diastolic (congestive) heart failure: Secondary | ICD-10-CM | POA: Diagnosis not present

## 2023-12-01 DIAGNOSIS — E785 Hyperlipidemia, unspecified: Secondary | ICD-10-CM

## 2023-12-01 DIAGNOSIS — R4182 Altered mental status, unspecified: Secondary | ICD-10-CM | POA: Diagnosis not present

## 2023-12-01 DIAGNOSIS — I10 Essential (primary) hypertension: Secondary | ICD-10-CM

## 2023-12-01 DIAGNOSIS — R1312 Dysphagia, oropharyngeal phase: Secondary | ICD-10-CM | POA: Diagnosis not present

## 2023-12-01 DIAGNOSIS — E876 Hypokalemia: Secondary | ICD-10-CM

## 2023-12-01 DIAGNOSIS — G609 Hereditary and idiopathic neuropathy, unspecified: Secondary | ICD-10-CM

## 2023-12-01 DIAGNOSIS — R29898 Other symptoms and signs involving the musculoskeletal system: Secondary | ICD-10-CM | POA: Diagnosis not present

## 2023-12-01 LAB — CBC AND DIFFERENTIAL
HCT: 41 (ref 41–53)
Hemoglobin: 14.1 (ref 13.5–17.5)
Neutrophils Absolute: 8822
Platelets: 291 K/uL (ref 150–400)
WBC: 11.7

## 2023-12-01 LAB — BASIC METABOLIC PANEL WITH GFR
BUN: 23 — AB (ref 4–21)
CO2: 25 — AB (ref 13–22)
Chloride: 103 (ref 99–108)
Creatinine: 0.8 (ref 0.6–1.3)
Glucose: 95
Potassium: 3.5 meq/L (ref 3.5–5.1)
Sodium: 137 (ref 137–147)

## 2023-12-01 LAB — HEPATIC FUNCTION PANEL
ALT: 17 U/L (ref 10–40)
AST: 20 (ref 14–40)
Alkaline Phosphatase: 124 (ref 25–125)
Bilirubin, Total: 0.8

## 2023-12-01 LAB — COMPREHENSIVE METABOLIC PANEL WITH GFR
Albumin: 2.8 — AB (ref 3.5–5.0)
Calcium: 8.6 — AB (ref 8.7–10.7)
Globulin: 3.1
eGFR: 81

## 2023-12-01 LAB — CBC: RBC: 4.61 (ref 3.87–5.11)

## 2023-12-01 NOTE — Assessment & Plan Note (Signed)
 Euvolemic Grade 2 diastolic dysfunction, no apparent swelling BLE, SOB, placed on Furosemide  in setting of weight gained, improved, followed by Cardiology, BNP 231/7 06/23/23. Off Furosemide  11/29/23 due to limited oral intake 2/2 progression of dysphagia.

## 2023-12-01 NOTE — Assessment & Plan Note (Signed)
 Pending GI follow up

## 2023-12-01 NOTE — Assessment & Plan Note (Signed)
 Cognitive impairment: MMSE 19/30 05/20/23, SNF [facility] for care needs.

## 2023-12-01 NOTE — Progress Notes (Unsigned)
 Location:  [facility] Room Number: 65-A Place of Service:  SNF (31) Provider: Arthea Nobel, NP  Code Status: FULL CODE Goals of Care:     12/01/2023   11:52 AM  Advanced Directives  Does Patient Have a Medical Advance Directive? Yes  Type of Estate agent of [place];Living will  Does patient want to make changes to medical advance directive? No - Patient declined  Copy of Healthcare Power of Attorney in Chart? Yes - validated most recent copy scanned in chart (See row information)     Chief Complaint  Patient presents with  . Hospitalization Follow-up    ED follow up     HPI: Patient is a 89 y.o. male seen today for f/u ED eval 11/29/23  ED eval 11/29/23 for dysphagia, recommended to f/u GI, CBC/diff, CMP/eGFR unremarkable except Lipise 64  Chronic dermatitis buttocks, followed by dermatology, presently: Mometasone, Fluocinonide , Nystatin  Dysphagia, choked in the past, chopped meat with thin fluid presently. Underwent GI evaluation, last dilatation 2022,  CXR normal 03/07/23             Anxiety, wants to try Mirtazapine, failed effexor, Lexapro, Sertraline . Occasional irritable.             The right arm pain, more at night, Tylenol  at night helped               03/24/23 right elbow fx(distal end of right humerus), healed with deformity and limited ROM, has been to surgeon-non surgical candidate.             CAD ST elevation MI LAD 05/2021, underwent Cath, stenting, s/p percutaneous coronary angioplasty, started DAPT, Echo EF 40-45%,  65-70% 11/02/21. On Eliquis  only now.              PE 11/02/21, had CTA 05/12/22, 09/08/22 Cardiology dc Eliquis , then resumed 03/2023 and off ASA and Plavix  03/2023 in setting of Afib.  Repeated CT @ 6 mos,  negative PE             Grade 2 diastolic dysfunction, no apparent swelling BLE, SOB, placed on Furosemide  in setting of weight gained, improved, followed by Cardiology, BNP 231/7 06/23/23. Off Furosemide  11/29/23  due to limited oral intake 2/2 progression of dysphagia.              Afib, resumed Eliquis  and dc'd ASA/Plavix  03/2023 per cardiology, on  Metoprolol , EKG 08/08/23 Afib with slow HR 50s.              GERD, thing liquid, hx of dilatation,  takes Pantoprazole , Hgb 14.3 11/29/23             Hypokalemia, on Kcl K 4.0 11/29/23             Hyponatremia, Na 138 11/29/23             HLD off Atorvastatin  recommended by Cardiology 06/28/23, LDL 30 11/12/22             HTN, on Metoprolol              Peripheral neuropathy, decreased gabapentin  300 mg every day/ twice daily/3 times daily due to complaining of sleepiness             Cognitive impairment: MMSE 19/30 05/20/23, SNF [facility] for care needs.                     Past Medical History:  Diagnosis Date  . Arthritis   .  Diverticulosis of colon   . External thrombosed hemorrhoids 06/11/2009   Qualifier: Diagnosis of  By: Johnny MD, Garnette LABOR   . GERD (gastroesophageal reflux disease)   . Hypertension     Past Surgical History:  Procedure Laterality Date  . cataract extaction, right  6/09   per Dr. Lorrene  . CATARACT EXTRACTION Left   . COLONOSCOPY  09/23/04   per Dr. Nolon, clear, no repeats needed   . CORONARY STENT INTERVENTION N/A 06/06/2021   Procedure: CORONARY STENT INTERVENTION;  Surgeon: Mady Bruckner, MD;  Location: [facility] INVASIVE CV LAB;  Service: Cardiovascular;  Laterality: N/A;  . CORONARY/GRAFT ACUTE MI REVASCULARIZATION N/A 06/06/2021   Procedure: Coronary/Graft Acute MI Revascularization;  Surgeon: Mady Bruckner, MD;  Location: [facility] INVASIVE CV LAB;  Service: Cardiovascular;  Laterality: N/A;  . EGD and esophageal diatation,  05-10-11   per Dr. Teressa    . HERNIA REPAIR    . LEFT HEART CATH AND CORONARY ANGIOGRAPHY N/A 06/06/2021   Procedure: LEFT HEART CATH AND CORONARY ANGIOGRAPHY;  Surgeon: Mady Bruckner, MD;  Location: [facility] INVASIVE CV LAB;  Service: Cardiovascular;  Laterality: N/A;  . rt inguinal hernia     x2    Allergies   Allergen Reactions  . Effexor [Venlafaxine]     Pt became lethargic, drowsy and not his usual self with effexor.  . Contrast Media [Iodinated Contrast Media] Rash    Outpatient Encounter Medications as of 12/01/2023  Medication Sig  . acetaminophen  (TYLENOL ) 325 MG tablet Take 650 mg by mouth every 8 (eight) hours as needed.  SABRA antiseptic oral rinse (BIOTENE) LIQD 15 mLs by Mouth Rinse route daily.  . apixaban  (ELIQUIS ) 5 MG TABS tablet Take 1 tablet (5 mg total) by mouth 2 (two) times daily.  . clotrimazole -betamethasone  (LOTRISONE ) cream Apply 1 Application topically 2 (two) times daily as needed. Fax#[phone]  . docusate sodium (COLACE) 100 MG capsule Take 100 mg by mouth 2 (two) times daily.  . fluocinonide  cream (LIDEX ) 0.05 % Apply 1 Application topically 2 (two) times daily.  . gabapentin  (NEURONTIN ) 300 MG capsule Take 300 mg by mouth daily.  . guaiFENesin  (MUCINEX ) 600 MG 12 hr tablet Take 1,200 mg by mouth 2 (two) times daily.  . hydrOXYzine  (VISTARIL ) 25 MG capsule Take 1 capsule (25 mg total) by mouth daily as needed. Give for agitation  . lactose free nutrition (BOOST) LIQD Take 237 mLs by mouth 3 (three) times daily.  . Menthol-Zinc Oxide (CALMOSEPTINE) 0.44-20.6 % OINT Apply 1 Application topically 2 (two) times daily. To buttocks  . metoprolol  succinate (TOPROL  XL) 25 MG 24 hr tablet Take 0.5 tablets (12.5 mg total) by mouth in the morning and at bedtime.  . miconazole (MICOTIN) 2 % powder Apply 1 Application topically every 8 (eight) hours as needed for itching.  . mometasone (ELOCON) 0.1 % cream Apply 1 Application topically daily as needed.  . nitroGLYCERIN  (NITROSTAT ) 0.4 MG SL tablet Place 1 tablet (0.4 mg total) under the tongue every 5 (five) minutes as needed for chest pain.  . nystatin  (MYCOSTATIN /NYSTOP ) powder Apply 1 Application topically 3 (three) times daily.  . pantoprazole  (PROTONIX ) 40 MG tablet Take 40 mg by mouth daily as needed.  . polyvinyl alcohol  (LIQUIFILM TEARS) 1.4 % ophthalmic solution Place 2 drops into the right eye 2 (two) times daily.  . pramoxine (CERAVE ITCH RELIEF) 1 % LOTN Apply 1 Application topically 2 (two) times daily.  . acetaminophen  (TYLENOL ) 500 MG tablet Take 1,000 mg by  mouth every 8 (eight) hours as needed. (Patient not taking: Reported on 12/01/2023)  . fluticasone  (FLONASE ) 50 MCG/ACT nasal spray Place 2 sprays into both nostrils daily. (Patient not taking: Reported on 12/01/2023)  . furosemide  (LASIX ) 40 MG tablet Take 1.5 pills daily for 3 days then resume 40 mg daily. (Patient not taking: Reported on 12/01/2023)  . furosemide  (LASIX ) 40 MG tablet Take 40 mg by mouth daily. (Patient not taking: Reported on 12/01/2023)  . gabapentin  (NEURONTIN ) 300 MG capsule TAKE 1 CAPSULE BY MOUTH THREE TIMES A DAY (Patient not taking: Reported on 12/01/2023)  . LORazepam (ATIVAN) 0.5 MG tablet Take 0.5 mg by mouth as directed. Give 1 tablet by mouth one time only for agitation for 1 day may repeat in 1-2 hours if no improvement in behavior.  . potassium chloride  SA (KLOR-CON  M) 20 MEQ tablet Take 1 tablet (20 mEq total) by mouth daily. (Patient not taking: Reported on 12/01/2023)  . sertraline  (ZOLOFT ) 25 MG tablet Take 1 tablet (25 mg total) by mouth daily. (Patient not taking: Reported on 12/01/2023)   No facility-administered encounter medications on file as of 12/01/2023.    Review of Systems:  Review of Systems  Constitutional:  Negative for appetite change, fatigue and fever.  HENT:  Positive for hearing loss and trouble swallowing. Negative for congestion.   Eyes:  Negative for visual disturbance.  Respiratory:  Positive for cough. Negative for chest tightness, shortness of breath and wheezing.   Cardiovascular:  Negative for leg swelling.  Gastrointestinal:  Negative for abdominal pain and constipation.  Genitourinary:  Negative for dysuria, frequency and urgency.       Incontinent of urine  Musculoskeletal:  Positive for  arthralgias and gait problem.       R arm pain.   Skin:  Positive for rash.  Neurological:  Negative for speech difficulty, weakness and headaches.  Psychiatric/Behavioral:  Negative for confusion, self-injury and sleep disturbance. The patient is not nervous/anxious.     Health Maintenance  Topic Date Due  . INFLUENZA VACCINE  11/25/2023  . COVID-19 Vaccine (7 - Mixed Product risk 2024-25 season) 04/26/2024 (Originally 08/10/2023)  . Medicare Annual Wellness (AWV)  05/18/2024  . DTaP/Tdap/Td (3 - Td or Tdap) 07/07/2031  . Pneumococcal Vaccine: 50+ Years  Completed  . Zoster Vaccines- Shingrix  Completed  . Hepatitis B Vaccines  Aged Out  . HPV VACCINES  Aged Out  . Meningococcal B Vaccine  Aged Out  . FOOT EXAM  Discontinued  . HEMOGLOBIN A1C  Discontinued  . OPHTHALMOLOGY EXAM  Discontinued    Physical Exam: Vitals:   12/01/23 1138 12/02/23 1036  BP: (!) 143/69 (!) 143/69  Pulse: 94   Resp: 18   Temp: 97.9 F (36.6 C)   SpO2: 98%   Weight: 145 lb 6.4 oz (66 kg)   Height: 5' 7 (1.702 m)    Body mass index is 22.77 kg/m. Physical Exam Vitals and nursing note reviewed.  Constitutional:      Appearance: Normal appearance.  HENT:     Head: Normocephalic.     Comments:      Nose: Nose normal. No congestion.     Mouth/Throat:     Mouth: Mucous membranes are moist.  Eyes:     Extraocular Movements: Extraocular movements intact.     Conjunctiva/sclera: Conjunctivae normal.     Pupils: Pupils are equal, round, and reactive to light.  Cardiovascular:     Rate and Rhythm: Normal rate. Rhythm irregular.  Heart sounds: No murmur heard. Pulmonary:     Effort: Pulmonary effort is normal.     Breath sounds: No wheezing, rhonchi or rales.  Abdominal:     General: Bowel sounds are normal.     Palpations: Abdomen is soft.     Tenderness: There is no abdominal tenderness.  Musculoskeletal:        General: Deformity present. No tenderness.     Cervical back: Normal range  of motion and neck supple.     Right lower leg: No edema.     Left lower leg: No edema.     Comments: Right elbow deformity and decreased ROM   Skin:    General: Skin is warm and dry.     Findings: Erythema and rash present.     Comments: Chronic perineal/buttocks skin irritation from moist and heat: dark reddened buttocks from previous examination   Neurological:     General: No focal deficit present.     Mental Status: He is alert and oriented to person, place, and time. Mental status is at baseline.     Gait: Gait abnormal.     Comments: W/c for mobility.   Psychiatric:        Mood and Affect: Mood normal.        Behavior: Behavior normal.        Thought Content: Thought content normal.     Labs reviewed: Basic Metabolic Panel: Recent Labs    06/23/23 1142 07/07/23 0000 10/19/23 0000 11/24/23 0000 11/29/23 2000  NA 140   < > 138 139 138  K 3.6   < > 4.5 3.3* 4.0  CL 102   < > 104 103 103  CO2 20   < > 24* 27* 24  GLUCOSE 107*  --   --   --  124*  BUN 15   < > 19 15 21   CREATININE 0.56*   < > 0.5* 0.7 0.76  CALCIUM  8.6   < > 8.9 8.7 8.7*   < > = values in this interval not displayed.   Liver Function Tests: Recent Labs    05/10/23 0000 06/23/23 1142 11/29/23 2000  AST 25 37 26  ALT 22 32 21  ALKPHOS 129* 171* 122  BILITOT  --  1.0 1.1  PROT  --  6.0 7.0  ALBUMIN 3.3* 3.1* 2.5*   Recent Labs    11/29/23 2000  LIPASE 64*   No results for input(s): AMMONIA in the last 8760 hours. CBC: Recent Labs    05/10/23 0000 06/23/23 1142 10/19/23 0000 11/29/23 2000  WBC 9.0 11.0* 7.6 10.8*  NEUTROABS 7,344.00  --  5,290.00 8.2*  HGB 12.3* 13.2 13.3* 14.3  HCT 37* 40.7 41 44.1  MCV  --  89  --  91.3  PLT 249 309 286 285   Lipid Panel: No results for input(s): CHOL, HDL, LDLCALC, TRIG, CHOLHDL, LDLDIRECT in the last 8760 hours. Lab Results  Component Value Date   HGBA1C 5.6 11/12/2022    Procedures since last visit: No results  found.  Assessment/Plan Congestive heart failure (CHF) (HCC) Euvolemic Grade 2 diastolic dysfunction, no apparent swelling BLE, SOB, placed on Furosemide  in setting of weight gained, improved, followed by Cardiology, BNP 231/7 06/23/23. Off Furosemide  11/29/23 due to limited oral intake 2/2 progression of dysphagia.   Paroxysmal atrial fibrillation (HCC) resumed Eliquis  and dc'd ASA/Plavix  03/2023 per cardiology, on  Metoprolol , EKG 08/08/23 Afib with slow HR 50s.   GERD thing liquid, hx  of dilatation,  takes Pantoprazole , Hgb 14.3 11/29/23  Dysphagia Pending GI follow up  Hypokalemia Hypokalemia, on Kcl K 4.0 11/29/23             Hyponatremia, Na 138 11/29/23  Hyperlipidemia off Atorvastatin  recommended by Cardiology 06/28/23, LDL 30 11/12/22  Essential hypertension Loose Bp control, continue Metoprolol .   Neuropathy, peripheral decreased gabapentin  300 mg every day/ twice daily/3 times daily due to complaining of sleepiness  Major neurocognitive disorder [facility]) Cognitive impairment: MMSE 19/30 05/20/23, SNF [facility] for care needs.   Anxiety  wants to try Mirtazapine, failed effexor, Lexapro, Sertraline . Occasional irritable.     Labs/tests ordered:  repeat Lipase.   Plan of care reviewed with the patient and charge nurse.

## 2023-12-01 NOTE — Assessment & Plan Note (Signed)
 Hypokalemia, on Kcl K 4.0 11/29/23             Hyponatremia, Na 138 11/29/23

## 2023-12-01 NOTE — Assessment & Plan Note (Signed)
 wants to try Mirtazapine, failed effexor, Lexapro, Sertraline . Occasional irritable.

## 2023-12-01 NOTE — Assessment & Plan Note (Signed)
 off Atorvastatin  recommended by Cardiology 06/28/23, LDL 30 11/12/22

## 2023-12-01 NOTE — Assessment & Plan Note (Signed)
 thing liquid, hx of dilatation,  takes Pantoprazole , Hgb 14.3 11/29/23

## 2023-12-01 NOTE — Assessment & Plan Note (Signed)
 resumed Eliquis  and dc'd ASA/Plavix  03/2023 per cardiology, on  Metoprolol , EKG 08/08/23 Afib with slow HR 50s.

## 2023-12-01 NOTE — Assessment & Plan Note (Signed)
 decreased gabapentin  300 mg every day/ twice daily/3 times daily due to complaining of sleepiness

## 2023-12-01 NOTE — Assessment & Plan Note (Signed)
Blood pressure is controlled, continue Metoprolol. 

## 2023-12-02 ENCOUNTER — Encounter: Payer: Self-pay | Admitting: Physician Assistant

## 2023-12-02 ENCOUNTER — Ambulatory Visit (INDEPENDENT_AMBULATORY_CARE_PROVIDER_SITE_OTHER): Admitting: Physician Assistant

## 2023-12-02 VITALS — BP 110/64 | HR 68 | Ht 67.0 in | Wt 145.0 lb

## 2023-12-02 DIAGNOSIS — I480 Paroxysmal atrial fibrillation: Secondary | ICD-10-CM

## 2023-12-02 DIAGNOSIS — Z8719 Personal history of other diseases of the digestive system: Secondary | ICD-10-CM | POA: Diagnosis not present

## 2023-12-02 DIAGNOSIS — K219 Gastro-esophageal reflux disease without esophagitis: Secondary | ICD-10-CM

## 2023-12-02 DIAGNOSIS — R1310 Dysphagia, unspecified: Secondary | ICD-10-CM

## 2023-12-02 DIAGNOSIS — Z7901 Long term (current) use of anticoagulants: Secondary | ICD-10-CM

## 2023-12-02 DIAGNOSIS — K8020 Calculus of gallbladder without cholecystitis without obstruction: Secondary | ICD-10-CM | POA: Diagnosis not present

## 2023-12-02 NOTE — Patient Instructions (Signed)
 You have been scheduled for a Barium Esophogram at [facility] (1st floor of the hospital) on 12/05/2023 at 10:30am.  Make certain not to have anything to eat or drink 3 hours prior to your test. If you need to reschedule for any reason, please contact radiology at [phone] to do so. __________________________________________________________________ A barium swallow is an examination that concentrates on views of the esophagus. This tends to be a double contrast exam (barium and two liquids which, when combined, create a gas to distend the wall of the oesophagus) or single contrast (non-ionic iodine based). The study is usually tailored to your symptoms so a good history is essential. Attention is paid during the study to the form, structure and configuration of the esophagus, looking for functional disorders (such as aspiration, dysphagia, achalasia, motility and reflux) EXAMINATION You may be asked to change into a gown, depending on the type of swallow being performed. A radiologist and radiographer will perform the procedure. The radiologist will advise you of the type of contrast selected for your procedure and direct you during the exam. You will be asked to stand, sit or lie in several different positions and to hold a small amount of fluid in your mouth before being asked to swallow while the imaging is performed .In some instances you may be asked to swallow barium coated marshmallows to assess the motility of a solid food bolus. The exam can be recorded as a digital or video fluoroscopy procedure. POST PROCEDURE It will take 1-2 days for the barium to pass through your system. To facilitate this, it is important, unless otherwise directed, to increase your fluids for the next 24-48hrs and to resume your normal diet.  This test typically takes about 30 minutes to perform. __________________________________________________________________________________ MERLINDA

## 2023-12-02 NOTE — Progress Notes (Signed)
 12/02/2023 Gabriel [name] [id number] 12-Nov-1924  Referring provider: Johnny Garnette LABOR, MD Primary GI doctor: Dr. Leigh  ASSESSMENT AND PLAN:  Dysphagia with history of large hiatal hernia  09/05/2020 EGD with benign-appearing esophageal stenosis dilated to 15.5 mm, medium sized hiatal hernia.  Patient is had a progression of dysphagia since he was last seen in 2024 with a ER visit 11/29/2023 where he was provided with IV fluids patient had normal electrolytes no anemia lipase minimally elevated 64. He has been having progression of his swallowing, he is still able to tolerate liquids, his own spit but his daughter and patient states it is getting worse, I do not believe he has food impaction at this time.  - protonix  40 mg BID -Stat barium swallow Monday -Continue liquid diet -long discussion with patient and daughters about patient is very high risk for patient to have procedures, they understand but wish for quality of life over quantity of life. If barium swallow is abnormal will proceed with EGD in the hospital for dilation. - ER precautions with patient and daughter   CAD Status post DES stent 2023 2023 echo EF 65 to 70% Off Plavix  and aspirin   Paroxysmal atrial fibrillation On Eliquis   CHF 05/2023 chest x-ray shows bilateral pleural effusions  Cholelithiasis seen on imaging without inflammation Patient has no symptoms at this time, labs are normal.  -We went over symptoms of cholecystitis, cholangitis and pancreatitis that would prompt him to return to the ER if they happen.  -Advised low fat diet.  -Agrees to monitor at his time.   Diverticulosis  Patient Care Team: Johnny Garnette LABOR, MD as PCP - General (Family Medicine) Kate Lonni LITTIE, MD as PCP - Cardiology (Cardiology) Octavia Charleston, MD as Consulting Physician (Ophthalmology) Elner Arley LABOR, MD as Consulting Physician (Ophthalmology) Duke, Jon Garre, PA as Physician Assistant (Cardiology)  HISTORY  OF PRESENT ILLNESS: 89 y.o. male with a past medical history listed below presents for evaluation of dysphagia.   Patient last seen in the office 05/18/2022 for dysphagia was was having to eat pured foods at the time in wheelchair previous MRI a year prior and was on Plavix  and Eliquis .  Discussed high risk nature of endoscopic evaluation and this was deferred.  Patient had progression of symptoms or is having difficult time even eating mashed potatoes and feels he is unable to eat.  He is at friend's home. Daughter are both here.   Will takes a bite of food and will spit out some mucus. Having issues with even pureed foods. Family states he likes to eat and are frustrated that he has not been able to eat. He is eating and drinking very little due to dysphagia, has lost about 20 lbs.He has been following with speech therapy and they say he is not at risk for  He can drink some ensure but can still feel this is getting caught in his esophagus. Patient also states he has decreased appetite and decreased taste.  Patient may be    He  reports that he has quit smoking. His smoking use included cigarettes. He has a 7.5 pack-year smoking history. He has never used smokeless tobacco. He reports that he does not drink alcohol and does not use drugs.  RELEVANT GI HISTORY, IMAGING AND LABS: Results          CBC    Component Value Date/Time   WBC 10.8 (H) 11/29/2023 2000   RBC 4.83 11/29/2023 2000   HGB 14.3  11/29/2023 2000   HGB 13.2 06/23/2023 1142   HCT 44.1 11/29/2023 2000   HCT 40.7 06/23/2023 1142   PLT 285 11/29/2023 2000   PLT 309 06/23/2023 1142   MCV 91.3 11/29/2023 2000   MCV 89 06/23/2023 1142   MCH 29.6 11/29/2023 2000   MCHC 32.4 11/29/2023 2000   RDW 15.1 11/29/2023 2000   RDW 13.9 06/23/2023 1142   LYMPHSABS 1.6 11/29/2023 2000   MONOABS 0.8 11/29/2023 2000   EOSABS 0.1 11/29/2023 2000   BASOSABS 0.1 11/29/2023 2000   Recent Labs    02/08/23 0000 03/08/23 0000  05/10/23 0000 06/23/23 1142 10/19/23 0000 11/29/23 2000  HGB 14.7 13.5 12.3* 13.2 13.3* 14.3    CMP     Component Value Date/Time   NA 138 11/29/2023 2000   NA 139 11/24/2023 0000   K 4.0 11/29/2023 2000   CL 103 11/29/2023 2000   CO2 24 11/29/2023 2000   GLUCOSE 124 (H) 11/29/2023 2000   BUN 21 11/29/2023 2000   BUN 15 11/24/2023 0000   CREATININE 0.76 11/29/2023 2000   CREATININE 0.66 (L) 02/28/2020 1026   CALCIUM  8.7 (L) 11/29/2023 2000   PROT 7.0 11/29/2023 2000   PROT 6.0 06/23/2023 1142   ALBUMIN 2.5 (L) 11/29/2023 2000   ALBUMIN 3.1 (L) 06/23/2023 1142   AST 26 11/29/2023 2000   ALT 21 11/29/2023 2000   ALKPHOS 122 11/29/2023 2000   BILITOT 1.1 11/29/2023 2000   BILITOT 1.0 06/23/2023 1142   GFRNONAA >60 11/29/2023 2000   GFRNONAA 83 02/28/2020 1026   GFRAA 97 02/28/2020 1026      Latest Ref Rng & Units 11/29/2023    8:00 PM 06/23/2023   11:42 AM 05/10/2023   12:00 AM  Hepatic Function  Total Protein 6.5 - 8.1 g/dL 7.0  6.0    Albumin 3.5 - 5.0 g/dL 2.5  3.1  3.3      AST 15 - 41 U/L 26  37  25      ALT 0 - 44 U/L 21  32  22      Alk Phosphatase 38 - 126 U/L 122  171  129      Total Bilirubin 0.0 - 1.2 mg/dL 1.1  1.0       This result is from an external source.      Current Medications:    Current Outpatient Medications (Cardiovascular):    furosemide  (LASIX ) 40 MG tablet, Take 1.5 pills daily for 3 days then resume 40 mg daily. (Patient not taking: Reported on 12/01/2023)   furosemide  (LASIX ) 40 MG tablet, Take 40 mg by mouth daily. (Patient not taking: Reported on 12/01/2023)   metoprolol  succinate (TOPROL  XL) 25 MG 24 hr tablet, Take 0.5 tablets (12.5 mg total) by mouth in the morning and at bedtime.   nitroGLYCERIN  (NITROSTAT ) 0.4 MG SL tablet, Place 1 tablet (0.4 mg total) under the tongue every 5 (five) minutes as needed for chest pain.  Current Outpatient Medications (Respiratory):    fluticasone  (FLONASE ) 50 MCG/ACT nasal spray, Place 2 sprays  into both nostrils daily. (Patient not taking: Reported on 12/01/2023)   guaiFENesin  (MUCINEX ) 600 MG 12 hr tablet, Take 1,200 mg by mouth 2 (two) times daily.  Current Outpatient Medications (Analgesics):    acetaminophen  (TYLENOL ) 325 MG tablet, Take 650 mg by mouth every 8 (eight) hours as needed.   acetaminophen  (TYLENOL ) 500 MG tablet, Take 1,000 mg by mouth every 8 (eight) hours as needed. (Patient not taking:  Reported on 12/01/2023)  Current Outpatient Medications (Hematological):    apixaban  (ELIQUIS ) 5 MG TABS tablet, Take 1 tablet (5 mg total) by mouth 2 (two) times daily.  Current Outpatient Medications (Other):    antiseptic oral rinse (BIOTENE) LIQD, 15 mLs by Mouth Rinse route daily.   clotrimazole -betamethasone  (LOTRISONE ) cream, Apply 1 Application topically 2 (two) times daily as needed. Fax#[phone]   docusate sodium (COLACE) 100 MG capsule, Take 100 mg by mouth 2 (two) times daily.   fluocinonide  cream (LIDEX ) 0.05 %, Apply 1 Application topically 2 (two) times daily.   gabapentin  (NEURONTIN ) 300 MG capsule, TAKE 1 CAPSULE BY MOUTH THREE TIMES A DAY (Patient not taking: Reported on 12/01/2023)   gabapentin  (NEURONTIN ) 300 MG capsule, Take 300 mg by mouth daily.   hydrOXYzine  (VISTARIL ) 25 MG capsule, Take 1 capsule (25 mg total) by mouth daily as needed. Give for agitation   lactose free nutrition (BOOST) LIQD, Take 237 mLs by mouth 3 (three) times daily.   LORazepam (ATIVAN) 0.5 MG tablet, Take 0.5 mg by mouth as directed. Give 1 tablet by mouth one time only for agitation for 1 day may repeat in 1-2 hours if no improvement in behavior.   Menthol-Zinc Oxide (CALMOSEPTINE) 0.44-20.6 % OINT, Apply 1 Application topically 2 (two) times daily. To buttocks   miconazole (MICOTIN) 2 % powder, Apply 1 Application topically every 8 (eight) hours as needed for itching.   mometasone (ELOCON) 0.1 % cream, Apply 1 Application topically daily as needed.   nystatin  (MYCOSTATIN /NYSTOP )  powder, Apply 1 Application topically 3 (three) times daily.   pantoprazole  (PROTONIX ) 40 MG tablet, Take 40 mg by mouth daily as needed.   polyvinyl alcohol (LIQUIFILM TEARS) 1.4 % ophthalmic solution, Place 2 drops into the right eye 2 (two) times daily.   potassium chloride  SA (KLOR-CON  M) 20 MEQ tablet, Take 1 tablet (20 mEq total) by mouth daily. (Patient not taking: Reported on 12/01/2023)   pramoxine (CERAVE ITCH RELIEF) 1 % LOTN, Apply 1 Application topically 2 (two) times daily.   sertraline  (ZOLOFT ) 25 MG tablet, Take 1 tablet (25 mg total) by mouth daily. (Patient not taking: Reported on 12/01/2023)  Medical History:  Past Medical History:  Diagnosis Date   Arthritis    Diverticulosis of colon    External thrombosed hemorrhoids 06/11/2009   Qualifier: Diagnosis of  By: Johnny MD, Garnette LABOR    GERD (gastroesophageal reflux disease)    Hypertension    Allergies:  Allergies  Allergen Reactions   Effexor [Venlafaxine]     Pt became lethargic, drowsy and not his usual self with effexor.   Contrast Media [Iodinated Contrast Media] Rash     Surgical History:  He  has a past surgical history that includes Colonoscopy (09/23/04); rt inguinal hernia; EGD and esophageal diatation, (05-10-11); cataract extaction, right (6/09); Hernia repair; Cataract extraction (Left); Coronary/Graft Acute MI Revascularization (N/A, 06/06/2021); LEFT HEART CATH AND CORONARY ANGIOGRAPHY (N/A, 06/06/2021); and CORONARY STENT INTERVENTION (N/A, 06/06/2021). Family History:  His family history includes Coronary artery disease in his father.  REVIEW OF SYSTEMS  : All other systems reviewed and negative except where noted in the History of Present Illness.  PHYSICAL EXAM: BP 110/64   Pulse 68   Ht 5' 7 (1.702 m)   Wt 145 lb (65.8 kg) Comment: thinks his weight maybe dowm to 104? Unable to weigh today.  SpO2 97%   BMI 22.71 kg/m  General Appearance: elderly appearing  in no apparent distress. Respiratory:  Respiratory  effort normal, BS equal bilaterally without rales, rhonchi, wheezing. Cardio: RRR with systolic murmur Abdomen: Soft,  Obese ,active bowel sounds. No tenderness .SABRA No masses. Rectal: Not evaluated Musculoskeletal: Full ROM, gait not tested Neuro: Alert, oriented x 1 No focal deficits. Psych:  Cooperative. Normal mood and affect.       Alan JONELLE Coombs, PA-C 4:19 PM

## 2023-12-02 NOTE — H&P (View-Only) (Signed)
 12/02/2023 Gabriel [name] [id number] 12-Nov-1924  Referring provider: Johnny Garnette LABOR, MD Primary GI doctor: Dr. Leigh  ASSESSMENT AND PLAN:  Dysphagia with history of large hiatal hernia  09/05/2020 EGD with benign-appearing esophageal stenosis dilated to 15.5 mm, medium sized hiatal hernia.  Patient is had a progression of dysphagia since he was last seen in 2024 with a ER visit 11/29/2023 where he was provided with IV fluids patient had normal electrolytes no anemia lipase minimally elevated 64. He has been having progression of his swallowing, he is still able to tolerate liquids, his own spit but his daughter and patient states it is getting worse, I do not believe he has food impaction at this time.  - protonix  40 mg BID -Stat barium swallow Monday -Continue liquid diet -long discussion with patient and daughters about patient is very high risk for patient to have procedures, they understand but wish for quality of life over quantity of life. If barium swallow is abnormal will proceed with EGD in the hospital for dilation. - ER precautions with patient and daughter   CAD Status post DES stent 2023 2023 echo EF 65 to 70% Off Plavix  and aspirin   Paroxysmal atrial fibrillation On Eliquis   CHF 05/2023 chest x-ray shows bilateral pleural effusions  Cholelithiasis seen on imaging without inflammation Patient has no symptoms at this time, labs are normal.  -We went over symptoms of cholecystitis, cholangitis and pancreatitis that would prompt him to return to the ER if they happen.  -Advised low fat diet.  -Agrees to monitor at his time.   Diverticulosis  Patient Care Team: Johnny Garnette LABOR, MD as PCP - General (Family Medicine) Kate Lonni LITTIE, MD as PCP - Cardiology (Cardiology) Octavia Charleston, MD as Consulting Physician (Ophthalmology) Elner Arley LABOR, MD as Consulting Physician (Ophthalmology) Duke, Jon Garre, PA as Physician Assistant (Cardiology)  HISTORY  OF PRESENT ILLNESS: 89 y.o. male with a past medical history listed below presents for evaluation of dysphagia.   Patient last seen in the office 05/18/2022 for dysphagia was was having to eat pured foods at the time in wheelchair previous MRI a year prior and was on Plavix  and Eliquis .  Discussed high risk nature of endoscopic evaluation and this was deferred.  Patient had progression of symptoms or is having difficult time even eating mashed potatoes and feels he is unable to eat.  He is at friend's home. Daughter are both here.   Will takes a bite of food and will spit out some mucus. Having issues with even pureed foods. Family states he likes to eat and are frustrated that he has not been able to eat. He is eating and drinking very little due to dysphagia, has lost about 20 lbs.He has been following with speech therapy and they say he is not at risk for  He can drink some ensure but can still feel this is getting caught in his esophagus. Patient also states he has decreased appetite and decreased taste.  Patient may be    He  reports that he has quit smoking. His smoking use included cigarettes. He has a 7.5 pack-year smoking history. He has never used smokeless tobacco. He reports that he does not drink alcohol and does not use drugs.  RELEVANT GI HISTORY, IMAGING AND LABS: Results          CBC    Component Value Date/Time   WBC 10.8 (H) 11/29/2023 2000   RBC 4.83 11/29/2023 2000   HGB 14.3  11/29/2023 2000   HGB 13.2 06/23/2023 1142   HCT 44.1 11/29/2023 2000   HCT 40.7 06/23/2023 1142   PLT 285 11/29/2023 2000   PLT 309 06/23/2023 1142   MCV 91.3 11/29/2023 2000   MCV 89 06/23/2023 1142   MCH 29.6 11/29/2023 2000   MCHC 32.4 11/29/2023 2000   RDW 15.1 11/29/2023 2000   RDW 13.9 06/23/2023 1142   LYMPHSABS 1.6 11/29/2023 2000   MONOABS 0.8 11/29/2023 2000   EOSABS 0.1 11/29/2023 2000   BASOSABS 0.1 11/29/2023 2000   Recent Labs    02/08/23 0000 03/08/23 0000  05/10/23 0000 06/23/23 1142 10/19/23 0000 11/29/23 2000  HGB 14.7 13.5 12.3* 13.2 13.3* 14.3    CMP     Component Value Date/Time   NA 138 11/29/2023 2000   NA 139 11/24/2023 0000   K 4.0 11/29/2023 2000   CL 103 11/29/2023 2000   CO2 24 11/29/2023 2000   GLUCOSE 124 (H) 11/29/2023 2000   BUN 21 11/29/2023 2000   BUN 15 11/24/2023 0000   CREATININE 0.76 11/29/2023 2000   CREATININE 0.66 (L) 02/28/2020 1026   CALCIUM  8.7 (L) 11/29/2023 2000   PROT 7.0 11/29/2023 2000   PROT 6.0 06/23/2023 1142   ALBUMIN 2.5 (L) 11/29/2023 2000   ALBUMIN 3.1 (L) 06/23/2023 1142   AST 26 11/29/2023 2000   ALT 21 11/29/2023 2000   ALKPHOS 122 11/29/2023 2000   BILITOT 1.1 11/29/2023 2000   BILITOT 1.0 06/23/2023 1142   GFRNONAA >60 11/29/2023 2000   GFRNONAA 83 02/28/2020 1026   GFRAA 97 02/28/2020 1026      Latest Ref Rng & Units 11/29/2023    8:00 PM 06/23/2023   11:42 AM 05/10/2023   12:00 AM  Hepatic Function  Total Protein 6.5 - 8.1 g/dL 7.0  6.0    Albumin 3.5 - 5.0 g/dL 2.5  3.1  3.3      AST 15 - 41 U/L 26  37  25      ALT 0 - 44 U/L 21  32  22      Alk Phosphatase 38 - 126 U/L 122  171  129      Total Bilirubin 0.0 - 1.2 mg/dL 1.1  1.0       This result is from an external source.      Current Medications:    Current Outpatient Medications (Cardiovascular):    furosemide  (LASIX ) 40 MG tablet, Take 1.5 pills daily for 3 days then resume 40 mg daily. (Patient not taking: Reported on 12/01/2023)   furosemide  (LASIX ) 40 MG tablet, Take 40 mg by mouth daily. (Patient not taking: Reported on 12/01/2023)   metoprolol  succinate (TOPROL  XL) 25 MG 24 hr tablet, Take 0.5 tablets (12.5 mg total) by mouth in the morning and at bedtime.   nitroGLYCERIN  (NITROSTAT ) 0.4 MG SL tablet, Place 1 tablet (0.4 mg total) under the tongue every 5 (five) minutes as needed for chest pain.  Current Outpatient Medications (Respiratory):    fluticasone  (FLONASE ) 50 MCG/ACT nasal spray, Place 2 sprays  into both nostrils daily. (Patient not taking: Reported on 12/01/2023)   guaiFENesin  (MUCINEX ) 600 MG 12 hr tablet, Take 1,200 mg by mouth 2 (two) times daily.  Current Outpatient Medications (Analgesics):    acetaminophen  (TYLENOL ) 325 MG tablet, Take 650 mg by mouth every 8 (eight) hours as needed.   acetaminophen  (TYLENOL ) 500 MG tablet, Take 1,000 mg by mouth every 8 (eight) hours as needed. (Patient not taking:  Reported on 12/01/2023)  Current Outpatient Medications (Hematological):    apixaban  (ELIQUIS ) 5 MG TABS tablet, Take 1 tablet (5 mg total) by mouth 2 (two) times daily.  Current Outpatient Medications (Other):    antiseptic oral rinse (BIOTENE) LIQD, 15 mLs by Mouth Rinse route daily.   clotrimazole -betamethasone  (LOTRISONE ) cream, Apply 1 Application topically 2 (two) times daily as needed. Fax#[phone]   docusate sodium (COLACE) 100 MG capsule, Take 100 mg by mouth 2 (two) times daily.   fluocinonide  cream (LIDEX ) 0.05 %, Apply 1 Application topically 2 (two) times daily.   gabapentin  (NEURONTIN ) 300 MG capsule, TAKE 1 CAPSULE BY MOUTH THREE TIMES A DAY (Patient not taking: Reported on 12/01/2023)   gabapentin  (NEURONTIN ) 300 MG capsule, Take 300 mg by mouth daily.   hydrOXYzine  (VISTARIL ) 25 MG capsule, Take 1 capsule (25 mg total) by mouth daily as needed. Give for agitation   lactose free nutrition (BOOST) LIQD, Take 237 mLs by mouth 3 (three) times daily.   LORazepam (ATIVAN) 0.5 MG tablet, Take 0.5 mg by mouth as directed. Give 1 tablet by mouth one time only for agitation for 1 day may repeat in 1-2 hours if no improvement in behavior.   Menthol-Zinc Oxide (CALMOSEPTINE) 0.44-20.6 % OINT, Apply 1 Application topically 2 (two) times daily. To buttocks   miconazole (MICOTIN) 2 % powder, Apply 1 Application topically every 8 (eight) hours as needed for itching.   mometasone (ELOCON) 0.1 % cream, Apply 1 Application topically daily as needed.   nystatin  (MYCOSTATIN /NYSTOP )  powder, Apply 1 Application topically 3 (three) times daily.   pantoprazole  (PROTONIX ) 40 MG tablet, Take 40 mg by mouth daily as needed.   polyvinyl alcohol (LIQUIFILM TEARS) 1.4 % ophthalmic solution, Place 2 drops into the right eye 2 (two) times daily.   potassium chloride  SA (KLOR-CON  M) 20 MEQ tablet, Take 1 tablet (20 mEq total) by mouth daily. (Patient not taking: Reported on 12/01/2023)   pramoxine (CERAVE ITCH RELIEF) 1 % LOTN, Apply 1 Application topically 2 (two) times daily.   sertraline  (ZOLOFT ) 25 MG tablet, Take 1 tablet (25 mg total) by mouth daily. (Patient not taking: Reported on 12/01/2023)  Medical History:  Past Medical History:  Diagnosis Date   Arthritis    Diverticulosis of colon    External thrombosed hemorrhoids 06/11/2009   Qualifier: Diagnosis of  By: Johnny MD, Garnette LABOR    GERD (gastroesophageal reflux disease)    Hypertension    Allergies:  Allergies  Allergen Reactions   Effexor [Venlafaxine]     Pt became lethargic, drowsy and not his usual self with effexor.   Contrast Media [Iodinated Contrast Media] Rash     Surgical History:  He  has a past surgical history that includes Colonoscopy (09/23/04); rt inguinal hernia; EGD and esophageal diatation, (05-10-11); cataract extaction, right (6/09); Hernia repair; Cataract extraction (Left); Coronary/Graft Acute MI Revascularization (N/A, 06/06/2021); LEFT HEART CATH AND CORONARY ANGIOGRAPHY (N/A, 06/06/2021); and CORONARY STENT INTERVENTION (N/A, 06/06/2021). Family History:  His family history includes Coronary artery disease in his father.  REVIEW OF SYSTEMS  : All other systems reviewed and negative except where noted in the History of Present Illness.  PHYSICAL EXAM: BP 110/64   Pulse 68   Ht 5' 7 (1.702 m)   Wt 145 lb (65.8 kg) Comment: thinks his weight maybe dowm to 104? Unable to weigh today.  SpO2 97%   BMI 22.71 kg/m  General Appearance: elderly appearing  in no apparent distress. Respiratory:  Respiratory  effort normal, BS equal bilaterally without rales, rhonchi, wheezing. Cardio: RRR with systolic murmur Abdomen: Soft,  Obese ,active bowel sounds. No tenderness .SABRA No masses. Rectal: Not evaluated Musculoskeletal: Full ROM, gait not tested Neuro: Alert, oriented x 1 No focal deficits. Psych:  Cooperative. Normal mood and affect.       Alan JONELLE Coombs, PA-C 4:19 PM

## 2023-12-03 NOTE — Progress Notes (Signed)
 Agree with assessment and plan as outlined.  Agree with barium swallow to help localize his symptoms. 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 Sounds like his symptoms have worsened over time. Anticipate he will likely need an EGD with dilation, and if that is the case would need approval to hold Eliquis  for 2 days to have a dilation. Alan please keep me posted on barium swallow results and we can decide on how to proceed from there. Thanks for seeing him.

## 2023-12-05 ENCOUNTER — Ambulatory Visit (HOSPITAL_COMMUNITY)
Admission: RE | Admit: 2023-12-05 | Discharge: 2023-12-05 | Disposition: A | Discharge status: Home / Self Care | Source: Ambulatory Visit | Attending: Physician Assistant | Admitting: Physician Assistant

## 2023-12-05 ENCOUNTER — Other Ambulatory Visit: Payer: Self-pay | Admitting: Physician Assistant

## 2023-12-05 ENCOUNTER — Telehealth: Payer: Self-pay

## 2023-12-05 ENCOUNTER — Ambulatory Visit: Payer: Self-pay | Admitting: Physician Assistant

## 2023-12-05 DIAGNOSIS — K224 Dyskinesia of esophagus: Secondary | ICD-10-CM | POA: Diagnosis not present

## 2023-12-05 DIAGNOSIS — R29898 Other symptoms and signs involving the musculoskeletal system: Secondary | ICD-10-CM | POA: Diagnosis not present

## 2023-12-05 DIAGNOSIS — K449 Diaphragmatic hernia without obstruction or gangrene: Secondary | ICD-10-CM | POA: Diagnosis not present

## 2023-12-05 DIAGNOSIS — R1310 Dysphagia, unspecified: Secondary | ICD-10-CM

## 2023-12-05 DIAGNOSIS — Z8719 Personal history of other diseases of the digestive system: Secondary | ICD-10-CM

## 2023-12-05 DIAGNOSIS — R1312 Dysphagia, oropharyngeal phase: Secondary | ICD-10-CM | POA: Diagnosis not present

## 2023-12-05 DIAGNOSIS — K219 Gastro-esophageal reflux disease without esophagitis: Secondary | ICD-10-CM

## 2023-12-05 NOTE — Telephone Encounter (Signed)
 [facility] Pre-operative Risk Assessment     Request for surgical clearance:     Endoscopy Procedure  What type of surgery is being performed?     EGD with Dilation  When is this surgery scheduled?     12/08/2023  What type of clearance is required ?   Pharmacy  Are there any medications that need to be held prior to surgery and how long? Eliquis   Practice name and name of physician performing surgery?  [facility]  What is your office phone and fax number?      Phone- [phone]  Fax- [phone]  Anesthesia type (None, local, MAC, general) ?       MAC   Please route your response to Elspeth Munroe RN

## 2023-12-06 ENCOUNTER — Encounter: Payer: Self-pay | Admitting: Nurse Practitioner

## 2023-12-06 ENCOUNTER — Telehealth: Payer: Self-pay | Admitting: Pharmacist

## 2023-12-06 ENCOUNTER — Other Ambulatory Visit: Payer: Self-pay

## 2023-12-06 ENCOUNTER — Non-Acute Institutional Stay (SKILLED_NURSING_FACILITY): Admitting: Nurse Practitioner

## 2023-12-06 ENCOUNTER — Telehealth: Payer: Self-pay | Admitting: Physician Assistant

## 2023-12-06 DIAGNOSIS — R748 Abnormal levels of other serum enzymes: Secondary | ICD-10-CM | POA: Diagnosis not present

## 2023-12-06 DIAGNOSIS — F0390 Unspecified dementia without behavioral disturbance: Secondary | ICD-10-CM | POA: Diagnosis not present

## 2023-12-06 DIAGNOSIS — R1310 Dysphagia, unspecified: Secondary | ICD-10-CM

## 2023-12-06 DIAGNOSIS — F419 Anxiety disorder, unspecified: Secondary | ICD-10-CM | POA: Diagnosis not present

## 2023-12-06 DIAGNOSIS — I10 Essential (primary) hypertension: Secondary | ICD-10-CM

## 2023-12-06 DIAGNOSIS — I480 Paroxysmal atrial fibrillation: Secondary | ICD-10-CM

## 2023-12-06 DIAGNOSIS — K219 Gastro-esophageal reflux disease without esophagitis: Secondary | ICD-10-CM | POA: Diagnosis not present

## 2023-12-06 DIAGNOSIS — G609 Hereditary and idiopathic neuropathy, unspecified: Secondary | ICD-10-CM | POA: Diagnosis not present

## 2023-12-06 DIAGNOSIS — Z66 Do not resuscitate: Secondary | ICD-10-CM | POA: Diagnosis not present

## 2023-12-06 DIAGNOSIS — I5043 Acute on chronic combined systolic (congestive) and diastolic (congestive) heart failure: Secondary | ICD-10-CM | POA: Diagnosis not present

## 2023-12-06 DIAGNOSIS — Z8719 Personal history of other diseases of the digestive system: Secondary | ICD-10-CM

## 2023-12-06 DIAGNOSIS — R1312 Dysphagia, oropharyngeal phase: Secondary | ICD-10-CM | POA: Diagnosis not present

## 2023-12-06 DIAGNOSIS — R29898 Other symptoms and signs involving the musculoskeletal system: Secondary | ICD-10-CM | POA: Diagnosis not present

## 2023-12-06 NOTE — Telephone Encounter (Signed)
 Prep instructions were faxed to [facility] [phone]

## 2023-12-06 NOTE — Telephone Encounter (Signed)
-----   Message from Jon Garre Duke sent at 12/05/2023  5:08 PM EDT ----- Erskin Barefoot and Kristin -  This patient needs a palliative EGD (sounds like) and GI would like clearance to hold eliquis  tomorrow morning.  I believe a request was sent through the preop pool but they are looking for an urgent answer for EGD in the hospital on Thursday. Any chance you can look at this? I've included Alan on this thread. I said to go ahead and hold eliquis  tomorrow morning and we could get them an official answer by tomorrow.   Thanks so much!! Angoie

## 2023-12-06 NOTE — Telephone Encounter (Signed)
   Patient Name: Gabriel [name]  DOB: January 29, 1925 MRN: [id number]  Primary Cardiologist: Lonni LITTIE Nanas, MD  Clinical pharmacists have reviewed the patient's past medical history, labs, and current medications as part of preoperative protocol coverage. The following recommendations have been made:  Patient with diagnosis of A Fib on Eliquis  for anticoagulation.     Procedure: EGD Date of procedure: 12/08/23   CHA2DS2-VASc Score = 7  This indicates a 11.2% annual risk of stroke. The patient's score is based upon: CHF History: 1 HTN History: 1 Diabetes History: 0 Stroke History: 2 Vascular Disease History: 1 Age Score: 2 Gender Score: 0   CrCl 49 ml/min Platelet count 285K   Patient has not had an Afib/aflutter ablation within the last 3 months or DCCV within the last 30 days   Per office protocol, patient can hold Eliquis  for 2 days prior to procedure.     Patient will not need bridging with Lovenox  (enoxaparin ) around procedure.   I will route this recommendation to the requesting party via Epic fax function and remove from pre-op pool.  Please call with questions.  Consuelo Thayne D Yarel Kilcrease, NP 12/06/2023, 4:21 PM

## 2023-12-06 NOTE — Telephone Encounter (Signed)
 Patient with diagnosis of A Fib on Eliquis  for anticoagulation.    Procedure: EGD Date of procedure: 12/08/23   CHA2DS2-VASc Score = 7  This indicates a 11.2% annual risk of stroke. The patient's score is based upon: CHF History: 1 HTN History: 1 Diabetes History: 0 Stroke History: 2 Vascular Disease History: 1 Age Score: 2 Gender Score: 0     CrCl 49 ml/min Platelet count 285K  Patient has not had an Afib/aflutter ablation within the last 3 months or DCCV within the last 30 days  Per office protocol, patient can hold Eliquis  for 2 days prior to procedure.    Patient will not need bridging with Lovenox  (enoxaparin ) around procedure.  **This guidance is not considered finalized until pre-operative APP has relayed final recommendations.**

## 2023-12-06 NOTE — Assessment & Plan Note (Signed)
 no apparent swelling BLE, SOB, placed on Furosemide  in setting of weight gained, improved, followed by Cardiology, BNP 231/7 06/23/23. Off Furosemide  11/29/23 due to limited oral intake 2/2 progression of dysphagia.

## 2023-12-06 NOTE — Telephone Encounter (Signed)
 Please see note below and respond as soon as possible

## 2023-12-06 NOTE — Assessment & Plan Note (Signed)
 decreased gabapentin  300 mg every day/ twice daily/3 times daily due to complaining of sleepiness

## 2023-12-06 NOTE — Telephone Encounter (Signed)
 Pt daughter Donny made aware Deja LPN from [facility] made aware to have the pt hold the Eliquis  2 days prior to the procedure. Donny and Deja verbalized understanding with all questions answered.

## 2023-12-06 NOTE — Assessment & Plan Note (Signed)
 MMSE 19/30 05/20/23, SNF [facility] for care needs.

## 2023-12-06 NOTE — Assessment & Plan Note (Signed)
 choked in the past, chopped meat with thin fluid presently. Underwent GI evaluation, had barium swallow study, pending EDG/dilation 12/08/23,  last dilatation 2022,

## 2023-12-06 NOTE — Assessment & Plan Note (Addendum)
 Blood pressure is loose controlled with systolic blood pressure in the 140s, on Metoprolol 

## 2023-12-06 NOTE — Telephone Encounter (Signed)
 Clearance was received and Deja LPN from [facility] made aware to have the pt hold the Eliquis  2 days prior to the procedure. Deja  verbalized understanding with all questions answered.

## 2023-12-06 NOTE — Telephone Encounter (Signed)
 Inbound call from patients living facility requesting a fax of prep instructions for upcoming procedure on 8/14.  [facility]  Fax number [phone]  Please advise Thank you

## 2023-12-06 NOTE — Progress Notes (Signed)
 Location:  [facility] Room Number: [phone] A Place of Service:  SNF (31) Provider:  [facility] Finnley Larusso, N.P.   Patient Care Team: Johnny Garnette LABOR, MD as PCP - General (Family Medicine) Kate Lonni CROME, MD as PCP - Cardiology (Cardiology) Octavia Charleston, MD as Consulting Physician (Ophthalmology) Elner Arley LABOR, MD as Consulting Physician (Ophthalmology) Duke, Jon Garre, PA as Physician Assistant (Cardiology)  Extended Emergency Contact Information Primary Emergency Contact: Garrett,Cathy  [place] Home Phone: [phone] Mobile Phone: [phone] Relation: Daughter Secondary Emergency Contact: Leia Pulling  [place] Home Phone: [phone] Relation: Daughter  Code Status:  DNR Goals of care: Advanced Directive information    12/01/2023   11:52 AM  Advanced Directives  Does Patient Have a Medical Advance Directive? Yes  Type of Estate agent of [place];Living will  Does patient want to make changes to medical advance directive? No - Patient declined  Copy of Healthcare Power of Attorney in Chart? Yes - validated most recent copy scanned in chart (See row information)     Chief Complaint  Patient presents with  . Medical Management of Chronic Issues    HPI:  Pt is a 89 y.o. male seen today for medical management of chronic diseases.     ED eval 11/29/23 for dysphagia, recommended to f/u GI, CBC/diff, CMP/eGFR unremarkable except Lipise 64             Chronic dermatitis buttocks, followed by dermatology, presently: Mometasone, Fluocinonide , Nystatin  Dysphagia, choked in the past, chopped meat with thin fluid presently. Underwent GI evaluation, had barium swallow study, pending EDG/dilation 12/08/23,  last dilatation 2022,               Anxiety, may try Mirtazapine to help with weight if dysphagia gets better after GI/dilation,  failed effexor, Lexapro, Sertraline . Occasional irritable.              The right arm pain, more at night, Tylenol  at night helped               03/24/23 right elbow fx(distal end of right humerus), healed with deformity and limited ROM, has been to surgeon-non surgical candidate.             CAD ST elevation MI LAD 05/2021, underwent Cath, stenting, s/p percutaneous coronary angioplasty, started DAPT, Echo EF 40-45%,  65-70% 11/02/21. On Eliquis  only now.              PE 11/02/21, had CTA 05/12/22, 09/08/22 Cardiology dc Eliquis , then resumed 03/2023 and off ASA and Plavix  03/2023 in setting of Afib.  Repeated CT @ 6 mos,  negative PE             Grade 2 diastolic dysfunction, no apparent swelling BLE, SOB, placed on Furosemide  in setting of weight gained, improved, followed by Cardiology, BNP 231/7 06/23/23. Off Furosemide  11/29/23 due to limited oral intake 2/2 progression of dysphagia.              Afib, resumed Eliquis  and dc'd ASA/Plavix  03/2023 per cardiology, on  Metoprolol , EKG 08/08/23 Afib with slow HR 50s.              GERD, thing liquid, hx of dilatation,  takes Pantoprazole , Hgb 14.1 12/01/23             Hypokalemia, on Kcl K 3.5 12/01/23             Hyponatremia, Na 137 12/01/23  HLD off Atorvastatin  recommended by Cardiology 06/28/23, LDL 30 11/12/22             HTN, on Metoprolol              Peripheral neuropathy, decreased gabapentin  300 mg every day/ twice daily/3 times daily due to complaining of sleepiness             Cognitive impairment: MMSE 19/30 05/20/23, SNF [facility] for care needs.    Past Medical History:  Diagnosis Date  . Arthritis   . Diverticulosis of colon   . External thrombosed hemorrhoids 06/11/2009   Qualifier: Diagnosis of  By: Johnny MD, Garnette LABOR   . GERD (gastroesophageal reflux disease)   . Hypertension    Past Surgical History:  Procedure Laterality Date  . cataract extaction, right  6/09   per Dr. Lorrene  . CATARACT EXTRACTION Left   . COLONOSCOPY  09/23/04   per Dr. Nolon, clear, no repeats needed   . CORONARY STENT  INTERVENTION N/A 06/06/2021   Procedure: CORONARY STENT INTERVENTION;  Surgeon: Mady Bruckner, MD;  Location: [facility] INVASIVE [facility];  Service: Cardiovascular;  Laterality: N/A;  . CORONARY/GRAFT ACUTE MI REVASCULARIZATION N/A 06/06/2021   Procedure: Coronary/Graft Acute MI Revascularization;  Surgeon: Mady Bruckner, MD;  Location: [facility];  Service: Cardiovascular;  Laterality: N/A;  . EGD and esophageal diatation,  05-10-11   per Dr. Teressa    . HERNIA REPAIR    . LEFT HEART CATH AND CORONARY ANGIOGRAPHY N/A 06/06/2021   Procedure: LEFT HEART CATH AND CORONARY ANGIOGRAPHY;  Surgeon: Mady Bruckner, MD;  Location: [facility];  Service: Cardiovascular;  Laterality: N/A;  . rt inguinal hernia     x2    Allergies  Allergen Reactions  . Effexor [Venlafaxine]     Pt became lethargic, drowsy and not his usual self with effexor.  . Contrast Media [Iodinated Contrast Media] Rash    Outpatient Encounter Medications as of 12/06/2023  Medication Sig  . acetaminophen  (TYLENOL ) 325 MG tablet Take 650 mg by mouth every 8 (eight) hours as needed.  SABRA antiseptic oral rinse (BIOTENE) LIQD 15 mLs by Mouth Rinse route daily.  . clotrimazole -betamethasone  (LOTRISONE ) cream Apply 1 Application topically 2 (two) times daily as needed. Fax#[phone]  . docusate sodium (COLACE) 100 MG capsule Take 100 mg by mouth 2 (two) times daily.  . fluocinonide  cream (LIDEX ) 0.05 % Apply 1 Application topically 2 (two) times daily.  . gabapentin  (NEURONTIN ) 300 MG capsule Take 300 mg by mouth daily.  . guaiFENesin  (MUCINEX ) 600 MG 12 hr tablet Take 1,200 mg by mouth 2 (two) times daily.  . hydrOXYzine  (VISTARIL ) 25 MG capsule Take 1 capsule (25 mg total) by mouth daily as needed. Give for agitation  . lactose free nutrition (BOOST) LIQD Take 237 mLs by mouth 3 (three) times daily.  . Menthol-Zinc Oxide (CALMOSEPTINE) 0.44-20.6 % OINT Apply 1 Application topically 2 (two) times daily. To buttocks  .  metoprolol  succinate (TOPROL  XL) 25 MG 24 hr tablet Take 0.5 tablets (12.5 mg total) by mouth in the morning and at bedtime.  . miconazole (MICOTIN) 2 % powder Apply 1 Application topically every 8 (eight) hours as needed for itching.  . mirtazapine (REMERON) 7.5 MG tablet Take 7.5 mg by mouth at bedtime.  . mometasone (ELOCON) 0.1 % cream Apply 1 Application topically daily as needed.  . nitroGLYCERIN  (NITROSTAT ) 0.4 MG SL tablet Place 1 tablet (0.4 mg total) under the tongue every 5 (  five) minutes as needed for chest pain.  . nystatin  (MYCOSTATIN /NYSTOP ) powder Apply 1 Application topically 3 (three) times daily.  . pantoprazole  (PROTONIX ) 40 MG tablet Take 40 mg by mouth daily as needed.  . polyvinyl alcohol (LIQUIFILM TEARS) 1.4 % ophthalmic solution Place 2 drops into the right eye 2 (two) times daily.  . potassium chloride  SA (KLOR-CON  M) 20 MEQ tablet Take 1 tablet (20 mEq total) by mouth daily.  . pramoxine (CERAVE ITCH RELIEF) 1 % LOTN Apply 1 Application topically 2 (two) times daily.  . acetaminophen  (TYLENOL ) 500 MG tablet Take 1,000 mg by mouth every 8 (eight) hours as needed. (Patient not taking: Reported on 12/06/2023)  . apixaban  (ELIQUIS ) 5 MG TABS tablet Take 1 tablet (5 mg total) by mouth 2 (two) times daily.  . fluticasone  (FLONASE ) 50 MCG/ACT nasal spray Place 2 sprays into both nostrils daily. (Patient not taking: Reported on 12/06/2023)  . furosemide  (LASIX ) 40 MG tablet Take 1.5 pills daily for 3 days then resume 40 mg daily. (Patient not taking: Reported on 12/06/2023)  . furosemide  (LASIX ) 40 MG tablet Take 40 mg by mouth daily. (Patient not taking: Reported on 12/06/2023)  . gabapentin  (NEURONTIN ) 300 MG capsule TAKE 1 CAPSULE BY MOUTH THREE TIMES A DAY (Patient not taking: Reported on 12/06/2023)  . LORazepam (ATIVAN) 0.5 MG tablet Take 0.5 mg by mouth as directed. Give 1 tablet by mouth one time only for agitation for 1 day may repeat in 1-2 hours if no improvement in  behavior. (Patient not taking: Reported on 12/06/2023)  . sertraline  (ZOLOFT ) 25 MG tablet Take 1 tablet (25 mg total) by mouth daily. (Patient not taking: Reported on 12/06/2023)   No facility-administered encounter medications on file as of 12/06/2023.    Review of Systems  Constitutional:  Negative for appetite change, fatigue and fever.  HENT:  Positive for hearing loss and trouble swallowing. Negative for congestion.   Eyes:  Negative for visual disturbance.  Respiratory:  Positive for cough. Negative for chest tightness, shortness of breath and wheezing.   Cardiovascular:  Negative for leg swelling.  Gastrointestinal:  Negative for abdominal pain and constipation.  Genitourinary:  Negative for dysuria, frequency and urgency.       Incontinent of urine  Musculoskeletal:  Positive for arthralgias and gait problem.       R arm pain.   Skin:  Positive for rash.  Neurological:  Negative for speech difficulty, weakness and headaches.  Psychiatric/Behavioral:  Negative for confusion, self-injury and sleep disturbance. The patient is not nervous/anxious.     Immunization History  Administered Date(s) Administered  . Fluad Quad(high Dose 65+) 02/15/2022, 02/08/2023  . Influenza Split 01/20/2012, 01/07/2013  . Influenza Whole 02/09/2006, 01/11/2008  . Influenza, High Dose Seasonal PF 12/30/2016, 12/27/2017, 01/08/2019  . Influenza,inj,quad, With Preservative 12/29/2018  . Influenza-Unspecified 01/17/2014, 01/14/2016, 12/30/2016  . Moderna Covid-19 Fall Seasonal Vaccine 28yrs & older 03/22/2022, 02/09/2023  . PFIZER Comirnaty(Gray Top)Covid-19 Tri-Sucrose Vaccine 05/28/2019, 03/04/2020, 09/23/2020  . Pfizer Covid-19 Vaccine Bivalent Booster 70yrs & up 02/25/2022  . Pneumococcal Conjugate-13 09/23/2015  . Pneumococcal Polysaccharide-23 03/13/2008  . Td 12/20/2007  . Tdap 07/06/2021  . Unspecified SARS-COV-2 Vaccination 04/30/2019  . Zoster Recombinant(Shingrix) 03/11/2020, 06/20/2020    Pertinent  Health Maintenance Due  Topic Date Due  . INFLUENZA VACCINE  12/26/2023 (Originally 11/25/2023)  . FOOT EXAM  Discontinued  . HEMOGLOBIN A1C  Discontinued  . OPHTHALMOLOGY EXAM  Discontinued      05/04/2022    2:44  PM 06/01/2022   11:04 AM 06/15/2022    9:14 AM 06/23/2022    3:12 PM 10/19/2022   11:19 AM  Fall Risk  Falls in the past year? 1 1 1 1  Exclusion - non ambulatory  Was there an injury with Fall? 1 1 1 1    Was there an injury with Fall? - Comments    Fx Rt Ekbow. Followed by Orthopedic   Fall Risk Category Calculator 3 3 3 2    Fall Risk Category (Retired) High       (RETIRED) Patient Fall Risk Level High fall risk       Patient at Risk for Falls Due to History of fall(s);Impaired balance/gait;Impaired mobility;Impaired vision History of fall(s);Impaired balance/gait;Impaired mobility;Impaired vision History of fall(s);Impaired balance/gait;Impaired mobility Orthopedic patient   Fall risk Follow up Falls evaluation completed  Falls evaluation completed Falls evaluation completed Falls prevention discussed Falls evaluation completed     Data saved with a previous flowsheet row definition   Functional Status Survey:    Vitals:   12/06/23 1058 12/06/23 1126  BP: (S) (!) 143/69 (!) 143/69  Pulse: 94   Weight: 145 lb (65.8 kg)   Height: 5' 7 (1.702 m)    Body mass index is 22.71 kg/m. Physical Exam Vitals and nursing note reviewed.  Constitutional:      Appearance: Normal appearance.  HENT:     Head: Normocephalic.     Comments:      Nose: Nose normal. No congestion.     Mouth/Throat:     Mouth: Mucous membranes are moist.  Eyes:     Extraocular Movements: Extraocular movements intact.     Conjunctiva/sclera: Conjunctivae normal.     Pupils: Pupils are equal, round, and reactive to light.  Cardiovascular:     Rate and Rhythm: Normal rate. Rhythm irregular.     Heart sounds: No murmur heard. Pulmonary:     Effort: Pulmonary effort is normal.      Breath sounds: No wheezing, rhonchi or rales.  Abdominal:     General: Bowel sounds are normal.     Palpations: Abdomen is soft.     Tenderness: There is no abdominal tenderness.  Musculoskeletal:        General: Deformity present. No tenderness.     Cervical back: Normal range of motion and neck supple.     Right lower leg: No edema.     Left lower leg: No edema.     Comments: Right elbow deformity and decreased ROM   Skin:    General: Skin is warm and dry.     Findings: Erythema and rash present.     Comments: Chronic perineal/buttocks skin irritation from moist and heat: dark reddened buttocks from previous examination   Neurological:     General: No focal deficit present.     Mental Status: He is alert and oriented to person, place, and time. Mental status is at baseline.     Gait: Gait abnormal.     Comments: W/c for mobility.   Psychiatric:        Mood and Affect: Mood normal.        Behavior: Behavior normal.        Thought Content: Thought content normal.     Labs reviewed: Recent Labs    06/23/23 1142 07/07/23 0000 11/24/23 0000 11/29/23 2000 12/01/23 0000  NA 140   < > 139 138 137  K 3.6   < > 3.3* 4.0 3.5  CL 102   < >  103 103 103  CO2 20   < > 27* 24 25*  GLUCOSE 107*  --   --  124*  --   BUN 15   < > 15 21 23*  CREATININE 0.56*   < > 0.7 0.76 0.8  CALCIUM  8.6   < > 8.7 8.7* 8.6*   < > = values in this interval not displayed.   Recent Labs    06/23/23 1142 11/29/23 2000 12/01/23 0000  AST 37 26 20  ALT 32 21 17  ALKPHOS 171* 122 124  BILITOT 1.0 1.1  --   PROT 6.0 7.0  --   ALBUMIN 3.1* 2.5* 2.8*   Recent Labs    06/23/23 1142 10/19/23 0000 11/29/23 2000 12/01/23 0000  WBC 11.0* 7.6 10.8* 11.7  NEUTROABS  --  5,290.00 8.2* 8,822.00  HGB 13.2 13.3* 14.3 14.1  HCT 40.7 41 44.1 41  MCV 89  --  91.3  --   PLT 309 286 285 291   Lab Results  Component Value Date   TSH 2.039 11/02/2021   Lab Results  Component Value Date   HGBA1C 5.6  11/12/2022   Lab Results  Component Value Date   CHOL 79 11/12/2022   HDL 33 (A) 11/12/2022   LDLCALC 30 11/12/2022   LDLDIRECT 72.0 09/27/2017   TRIG 80 11/12/2022   CHOLHDL 2 10/14/2021    Significant Diagnostic Results in last 30 days:  DG ESOPHAGUS W SINGLE CM (SOL OR THIN BA) Result Date: 12/05/2023 CLINICAL DATA:  Worsening dysphagia. History of esophageal stricture. EXAM: ESOPHAGUS/BARIUM SWALLOW/TABLET STUDY TECHNIQUE: Single contrast examination was performed using thin liquid barium. This exam was performed by Sari Lamp, PA-C, and was supervised and interpreted by Dr. Rockey Kilts. FLUOROSCOPY: Radiation Exposure Index (as provided by the fluoroscopic device): 13.7 mGy Kerma COMPARISON:  None Available. FINDINGS: Extremely limited exam secondary to patient age, immobility. Patient halted the exam prior to its completion. Moderate esophageal dysmotility, with incomplete primary peristaltic wave, contrast stasis throughout the esophagus. A small to moderate hiatal hernia. An area of persistent underdistention within the gastroesophageal junction, best distended on 167 of series 1. There is likely a Zenker's diverticulum. Suboptimally evaluated secondary to inability to perform lateral view. Apparent delayed passage of contrast just caudal to this level, suggesting a moderate stricture, including on 77/5. IMPRESSION: 1. Markedly degraded exam as detailed above. 2. Moderate esophageal dysmotility, likely presbyesophagus. 3. 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 4. Small to moderate hiatal hernia with possible mild stricture at the gastroesophageal junction. Electronically Signed   By: Rockey Kilts M.D.   On: 12/05/2023 15:59    Assessment/Plan Congestive heart failure (CHF) (HCC) no apparent swelling BLE, SOB, placed on Furosemide  in setting of weight gained, improved, followed by Cardiology, BNP 231/7 06/23/23. Off Furosemide  11/29/23 due to limited oral  intake 2/2 progression of dysphagia.   Paroxysmal atrial fibrillation (HCC)  resumed Eliquis  and dc'd ASA/Plavix  03/2023 per cardiology, on  Metoprolol , EKG 08/08/23 Afib with slow HR 50s.   GERD  thing liquid, hx of dilatation,  takes Pantoprazole , Hgb 14.1 12/01/23  Essential hypertension Blood pressure is loose controlled with systolic blood pressure in the 140s, on Metoprolol    Neuropathy, peripheral decreased gabapentin  300 mg every day/ twice daily/3 times daily due to complaining of sleepiness  Major neurocognitive disorder [facility]) MMSE 19/30 05/20/23, SNF [facility] for care needs.   Anxiety may try Mirtazapine to help with weight if dysphagia gets better after GI/dilation,  failed effexor, Lexapro, Sertraline . Occasional irritable.  Dysphagia choked in the past, chopped meat with thin fluid presently. Underwent GI evaluation, had barium swallow study, pending EDG/dilation 12/08/23,  last dilatation 2022,       Family/ staff Communication: Plan of care reviewed with the patient and the charge nurse  Labs/tests ordered: None

## 2023-12-06 NOTE — Assessment & Plan Note (Signed)
 may try Mirtazapine to help with weight if dysphagia gets better after GI/dilation,  failed effexor, Lexapro, Sertraline . Occasional irritable.

## 2023-12-06 NOTE — Assessment & Plan Note (Signed)
 resumed Eliquis  and dc'd ASA/Plavix  03/2023 per cardiology, on  Metoprolol , EKG 08/08/23 Afib with slow HR 50s.

## 2023-12-06 NOTE — Assessment & Plan Note (Signed)
 thing liquid, hx of dilatation,  takes Pantoprazole , Hgb 14.1 12/01/23

## 2023-12-06 NOTE — Telephone Encounter (Signed)
 Inbound call from Deja with [facility] calling regarding if patient is needing to stop Eliqus. Deja request to call at [phone] ext [phone] or can fax the information over to [phone]. Please advise.

## 2023-12-07 ENCOUNTER — Encounter (HOSPITAL_COMMUNITY): Payer: Self-pay | Admitting: Gastroenterology

## 2023-12-07 DIAGNOSIS — R1312 Dysphagia, oropharyngeal phase: Secondary | ICD-10-CM | POA: Diagnosis not present

## 2023-12-07 DIAGNOSIS — R29898 Other symptoms and signs involving the musculoskeletal system: Secondary | ICD-10-CM | POA: Diagnosis not present

## 2023-12-07 IMAGING — DX DG CHEST 1V PORT
1 series · 1 of 1 positions shown · non-contrast
Comparison: Chest CT 09/04/2013.

CLINICAL DATA: Chest pain.

EXAM:
PORTABLE CHEST 1 VIEW

[chest]
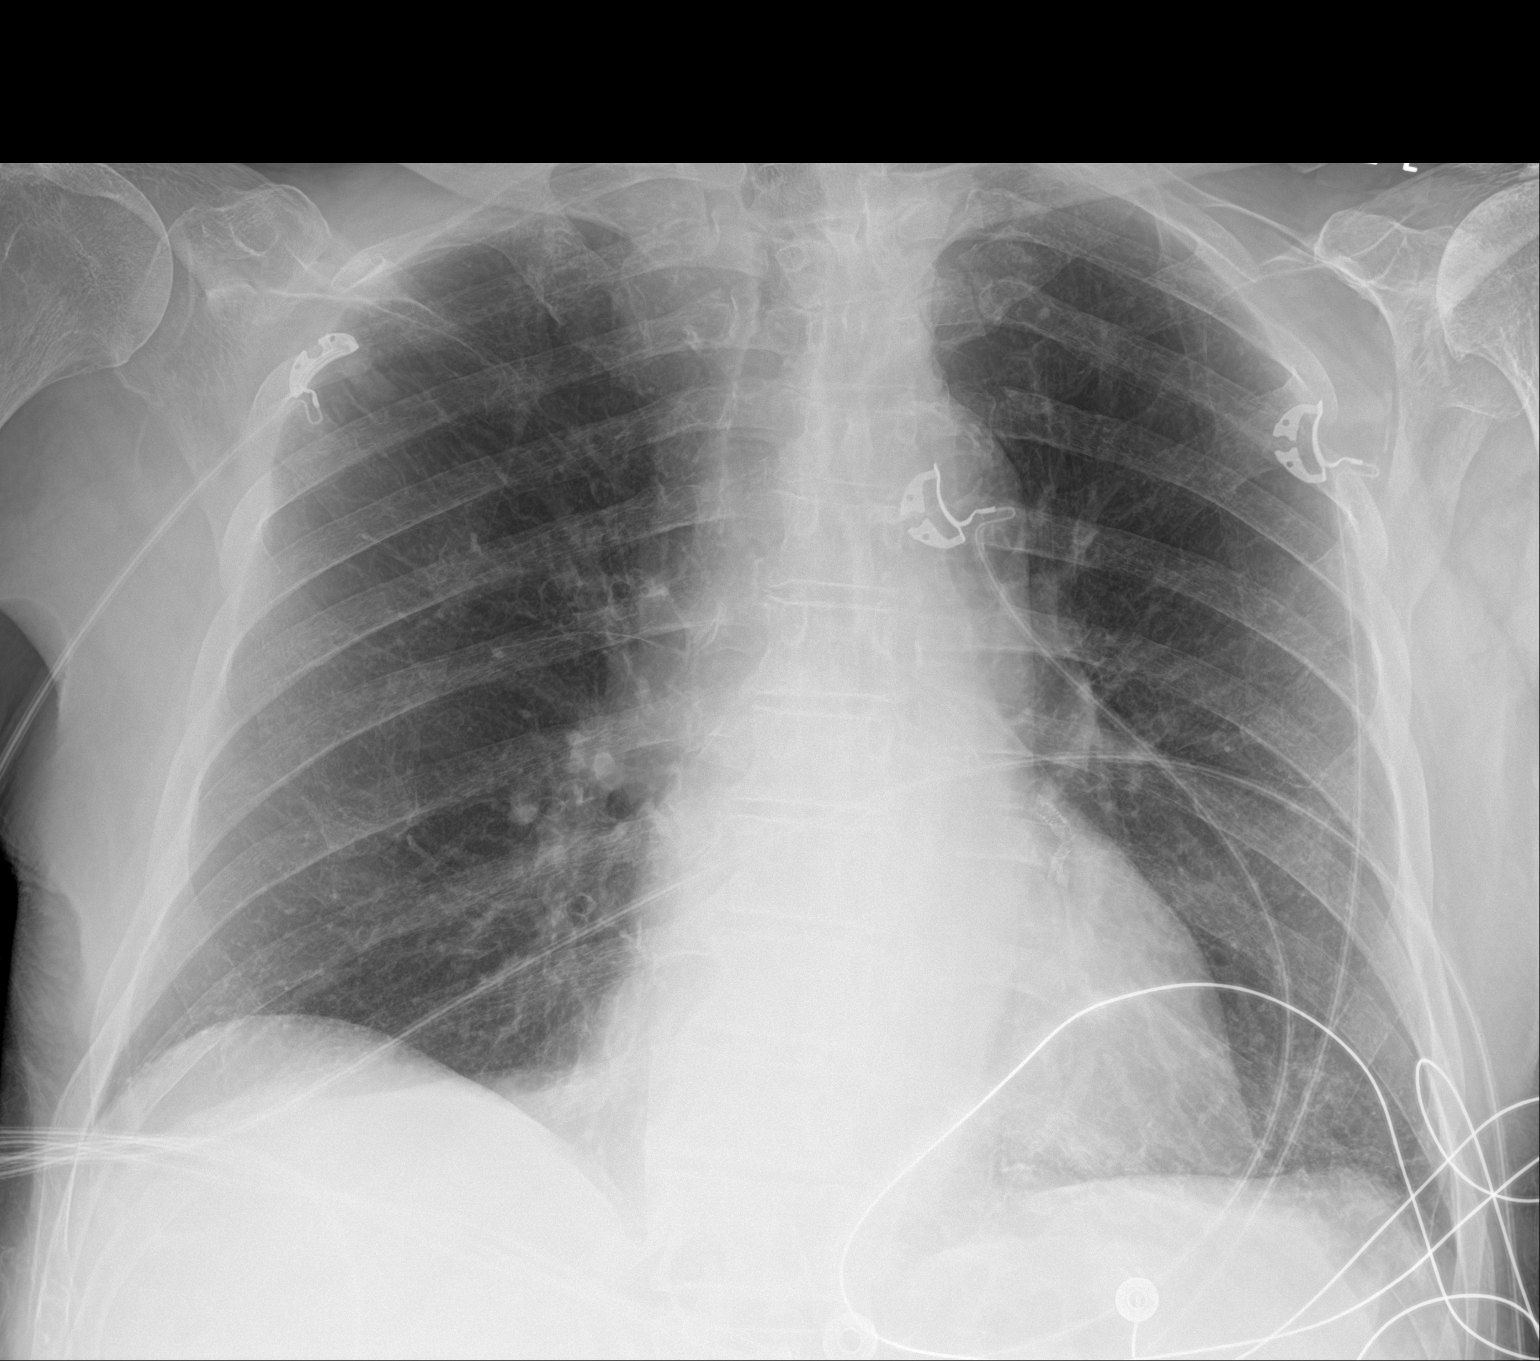

[1 of 1 positions shown; findings below may reference images not displayed]

FINDINGS: The lungs are mildly emphysematous. There is mild chronic subpleural
reticulation in the left base. No focal pneumonia is seen, with
slight elevation chronically right hemidiaphragm.

There is evidence of stenting of the LAD coronary artery , normal
heart size. Aortic atherosclerosis with unremarkable mediastinum.

There is osteopenia and thoracic spondylosis, mild thoracic
levoscoliosis.
IMPRESSION: No active disease.  Stable COPD chest.  Aortic atherosclerosis.

## 2023-12-08 ENCOUNTER — Ambulatory Visit (HOSPITAL_BASED_OUTPATIENT_CLINIC_OR_DEPARTMENT_OTHER): Admitting: Anesthesiology

## 2023-12-08 ENCOUNTER — Ambulatory Visit (HOSPITAL_COMMUNITY)
Admission: RE | Admit: 2023-12-08 | Discharge: 2023-12-08 | Disposition: A | Discharge status: Home / Self Care | Attending: Gastroenterology | Admitting: Gastroenterology

## 2023-12-08 ENCOUNTER — Other Ambulatory Visit: Payer: Self-pay

## 2023-12-08 ENCOUNTER — Encounter (HOSPITAL_COMMUNITY)
Admission: RE | Disposition: A | Discharge status: Home / Self Care | Payer: Self-pay | Source: Home / Self Care | Attending: Gastroenterology

## 2023-12-08 ENCOUNTER — Encounter (HOSPITAL_COMMUNITY): Payer: Self-pay | Admitting: Gastroenterology

## 2023-12-08 ENCOUNTER — Ambulatory Visit (HOSPITAL_COMMUNITY): Admitting: Anesthesiology

## 2023-12-08 DIAGNOSIS — Z955 Presence of coronary angioplasty implant and graft: Secondary | ICD-10-CM | POA: Diagnosis not present

## 2023-12-08 DIAGNOSIS — B3781 Candidal esophagitis: Secondary | ICD-10-CM | POA: Diagnosis not present

## 2023-12-08 DIAGNOSIS — D759 Disease of blood and blood-forming organs, unspecified: Secondary | ICD-10-CM | POA: Diagnosis not present

## 2023-12-08 DIAGNOSIS — K222 Esophageal obstruction: Secondary | ICD-10-CM | POA: Diagnosis not present

## 2023-12-08 DIAGNOSIS — I252 Old myocardial infarction: Secondary | ICD-10-CM | POA: Insufficient documentation

## 2023-12-08 DIAGNOSIS — Z87891 Personal history of nicotine dependence: Secondary | ICD-10-CM | POA: Diagnosis not present

## 2023-12-08 DIAGNOSIS — K449 Diaphragmatic hernia without obstruction or gangrene: Secondary | ICD-10-CM

## 2023-12-08 DIAGNOSIS — Z79899 Other long term (current) drug therapy: Secondary | ICD-10-CM | POA: Insufficient documentation

## 2023-12-08 DIAGNOSIS — K3189 Other diseases of stomach and duodenum: Secondary | ICD-10-CM

## 2023-12-08 DIAGNOSIS — Z7901 Long term (current) use of anticoagulants: Secondary | ICD-10-CM | POA: Diagnosis not present

## 2023-12-08 DIAGNOSIS — Z993 Dependence on wheelchair: Secondary | ICD-10-CM | POA: Insufficient documentation

## 2023-12-08 DIAGNOSIS — B49 Unspecified mycosis: Secondary | ICD-10-CM | POA: Diagnosis not present

## 2023-12-08 DIAGNOSIS — Z6820 Body mass index (BMI) 20.0-20.9, adult: Secondary | ICD-10-CM | POA: Insufficient documentation

## 2023-12-08 DIAGNOSIS — E46 Unspecified protein-calorie malnutrition: Secondary | ICD-10-CM | POA: Diagnosis not present

## 2023-12-08 DIAGNOSIS — K219 Gastro-esophageal reflux disease without esophagitis: Secondary | ICD-10-CM

## 2023-12-08 DIAGNOSIS — K269 Duodenal ulcer, unspecified as acute or chronic, without hemorrhage or perforation: Secondary | ICD-10-CM | POA: Insufficient documentation

## 2023-12-08 DIAGNOSIS — K8020 Calculus of gallbladder without cholecystitis without obstruction: Secondary | ICD-10-CM | POA: Diagnosis not present

## 2023-12-08 DIAGNOSIS — I2510 Atherosclerotic heart disease of native coronary artery without angina pectoris: Secondary | ICD-10-CM

## 2023-12-08 DIAGNOSIS — I480 Paroxysmal atrial fibrillation: Secondary | ICD-10-CM | POA: Insufficient documentation

## 2023-12-08 DIAGNOSIS — E785 Hyperlipidemia, unspecified: Secondary | ICD-10-CM | POA: Diagnosis not present

## 2023-12-08 DIAGNOSIS — F0390 Unspecified dementia without behavioral disturbance: Secondary | ICD-10-CM | POA: Insufficient documentation

## 2023-12-08 DIAGNOSIS — I10 Essential (primary) hypertension: Secondary | ICD-10-CM | POA: Diagnosis not present

## 2023-12-08 DIAGNOSIS — F419 Anxiety disorder, unspecified: Secondary | ICD-10-CM | POA: Diagnosis not present

## 2023-12-08 DIAGNOSIS — R1310 Dysphagia, unspecified: Secondary | ICD-10-CM | POA: Diagnosis not present

## 2023-12-08 DIAGNOSIS — K2289 Other specified disease of esophagus: Secondary | ICD-10-CM | POA: Diagnosis not present

## 2023-12-08 DIAGNOSIS — K2960 Other gastritis without bleeding: Secondary | ICD-10-CM | POA: Insufficient documentation

## 2023-12-08 DIAGNOSIS — L22 Diaper dermatitis: Secondary | ICD-10-CM | POA: Diagnosis not present

## 2023-12-08 DIAGNOSIS — Q399 Congenital malformation of esophagus, unspecified: Secondary | ICD-10-CM | POA: Insufficient documentation

## 2023-12-08 DIAGNOSIS — K2100 Gastro-esophageal reflux disease with esophagitis, without bleeding: Secondary | ICD-10-CM | POA: Diagnosis not present

## 2023-12-08 DIAGNOSIS — R627 Adult failure to thrive: Secondary | ICD-10-CM | POA: Diagnosis not present

## 2023-12-08 DIAGNOSIS — H04123 Dry eye syndrome of bilateral lacrimal glands: Secondary | ICD-10-CM | POA: Diagnosis not present

## 2023-12-08 DIAGNOSIS — R682 Dry mouth, unspecified: Secondary | ICD-10-CM | POA: Diagnosis not present

## 2023-12-08 DIAGNOSIS — R634 Abnormal weight loss: Secondary | ICD-10-CM | POA: Diagnosis not present

## 2023-12-08 DIAGNOSIS — Z86711 Personal history of pulmonary embolism: Secondary | ICD-10-CM | POA: Diagnosis not present

## 2023-12-08 DIAGNOSIS — E876 Hypokalemia: Secondary | ICD-10-CM | POA: Diagnosis not present

## 2023-12-08 DIAGNOSIS — F39 Unspecified mood [affective] disorder: Secondary | ICD-10-CM | POA: Diagnosis not present

## 2023-12-08 DIAGNOSIS — Z8719 Personal history of other diseases of the digestive system: Secondary | ICD-10-CM

## 2023-12-08 HISTORY — PX: BALLOON DILATION: SHX5330

## 2023-12-08 HISTORY — PX: ESOPHAGOGASTRODUODENOSCOPY: SHX5428

## 2023-12-08 LAB — BASIC METABOLIC PANEL WITH GFR
BUN: 19 (ref 4–21)
CO2: 23 — AB (ref 13–22)
Chloride: 108 (ref 99–108)
Creatinine: 0.6 (ref 0.6–1.3)
Glucose: 99
Potassium: 3.5 meq/L (ref 3.5–5.1)
Sodium: 141 (ref 137–147)

## 2023-12-08 LAB — COMPREHENSIVE METABOLIC PANEL WITH GFR
Calcium: 8.3 — AB (ref 8.7–10.7)
eGFR: 86

## 2023-12-08 SURGERY — EGD (ESOPHAGOGASTRODUODENOSCOPY)
Anesthesia: Monitor Anesthesia Care

## 2023-12-08 MED ORDER — PROPOFOL 500 MG/50ML IV EMUL
INTRAVENOUS | Status: AC
Start: 2023-12-08 — End: 2023-12-08
  Filled 2023-12-08: qty 50

## 2023-12-08 MED ORDER — FLUCONAZOLE 200 MG PO TABS: 200.0000 mg | ORAL_TABLET | Freq: Every day | ORAL | 0 refills | Status: AC

## 2023-12-08 MED ORDER — SODIUM CHLORIDE 0.9 % IV SOLN: INTRAVENOUS | Status: DC

## 2023-12-08 MED ORDER — SUCRALFATE 1 GM/10ML PO SUSP: 1.0000 g | Freq: Three times a day (TID) | ORAL | 1 refills | Status: DC | PRN

## 2023-12-08 MED ORDER — PROPOFOL 500 MG/50ML IV EMUL
INTRAVENOUS | Status: DC | PRN
  Administered 2023-12-08 (×2): 20 mg via INTRAVENOUS
  Administered 2023-12-08: 150 ug/kg/min via INTRAVENOUS

## 2023-12-08 MED ORDER — PROPOFOL 10 MG/ML IV BOLUS
INTRAVENOUS | Status: AC
  Filled 2023-12-08: qty 20

## 2023-12-08 MED ORDER — SODIUM CHLORIDE 0.9 % IV SOLN
INTRAVENOUS | Status: AC | PRN
  Administered 2023-12-08: 500 mL via INTRAMUSCULAR

## 2023-12-08 MED ORDER — LIDOCAINE HCL (PF) 2 % IJ SOLN
INTRAMUSCULAR | Status: DC | PRN
Start: 2023-12-08 — End: 2023-12-08
  Administered 2023-12-08: 40 mg via INTRADERMAL

## 2023-12-08 NOTE — Op Note (Signed)
 [facility] Patient Name: Gabriel [name] Procedure Date: 12/08/2023 MRN: [id number] Attending MD: Elspeth SQUIBB. Leigh , MD, [id number] Date of Birth: 1924-05-21 CSN: [id number] Age: 89 Admit Type: Outpatient Procedure:                Upper GI endoscopy Indications:              Dysphagia - history of GEJ stricture, abnormal                            barium swallow - ? zenker's ? vs. proximal                            esophageal stricture vs. distal esophageal                            stricture, + dysmotility. Patient has responded to                            GEJ dilation in the past. Progressively worsening                            dysphagia, weight loss Providers:                Elspeth SQUIBB. Leigh, MD, Willy Hummer, RN, Felice Sar, Technician Referring MD:             Elspeth SQUIBB. Leigh, MD Medicines:                Monitored Anesthesia Care Complications:            No immediate complications. Estimated blood loss:                            Minimal. Estimated Blood Loss:     Estimated blood loss was minimal. Procedure:                Pre-Anesthesia Assessment:                           - Prior to the procedure, a History and Physical                            was performed, and patient medications and                            allergies were reviewed. The patient's tolerance of                            previous anesthesia was also reviewed. The risks                            and benefits of the procedure and the sedation  options and risks were discussed with the patient.                            All questions were answered, and informed consent                            was obtained. Prior Anticoagulants: The patient has                            taken Eliquis  (apixaban ), last dose was 2 days                            prior to procedure. ASA Grade Assessment: IV - A                             patient with severe systemic disease that is a                            constant threat to life. After reviewing the risks                            and benefits, the patient was deemed in                            satisfactory condition to undergo the procedure.                           After obtaining informed consent, the endoscope was                            passed under direct vision. Throughout the                            procedure, the patient's blood pressure, pulse, and                            oxygen saturations were monitored continuously. The                            [id number]-H190 ([id number]) Olympus endoscope was introduced                            through the mouth, and advanced to the second part                            of duodenum. The upper GI endoscopy was                            accomplished without difficulty. The patient                            tolerated the procedure well. Scope In: Scope Out: Findings:      Esophagogastric landmarks were identified: the Z-line  was found at 35       cm, the gastroesophageal junction was found at 35 cm and the upper       extent of the gastric folds was found at 40 cm from the incisors.      A 5 cm hiatal hernia was present.      One benign-appearing, intrinsic severe stenosis was found 35 cm from the       incisors. This stenosis measured less than one cm (in length). The       stenosis was traversed. There was ulceration and inflammation in the       area. A TTS dilator was passed through the scope. Dilation with an       12-02-08 mm balloon dilator was performed to 8 mm, 9 mm and 10 mm with       appropriate dilation effect. In the cardia on retroflexed views there       was some nodularity which appeared inflammatory, but biopsies were taken       with a cold forceps for histology to ensure no dysplastic change.      The examined esophagus was rather tortuous. No appreciable stenosis or       Zenker's in the  proximal esophagus but there is an angulated turn       entering the UES.      Patchy, white plaques were found in the upper third of the esophagus and       in the middle third of the esophagus.      The exam of the esophagus was otherwise normal.      The entire examined stomach was normal.      Two non-bleeding superficial duodenal ulcers with no stigmata of       bleeding were found in the duodenal bulb.      The exam of the duodenum was otherwise normal. Impression:               - Esophagogastric landmarks identified.                           - 5 cm hiatal hernia.                           - Severe benign-appearing esophageal stenosis with                            associated inflammatory change. Dilated to 10mm.                            Biopsied as outlined above.                           - Tortuous esophagus. No obvious Zenker's /                            stricture in the proximal esophagus.                           - Esophageal plaques were found, consistent with                            candidiasis.                           -  Normal stomach.                           - Small superficial non-bleeding duodenal ulcers in                            the bulb with no stigmata of bleeding.                           - Normal duodenum otherwise.                           Overall, suspect severe GEJ stenosis in the setting                            of large hiatal hernia and dysmotility is causing                            dysphagia. No appreciable Zenker's or stenosis in                            the proximal esophagus in relation to barium                            swallow findings, however there is an angulated                            turn / entrance to the UES. Dilation performed.                            Needs higher dose PPI / carafate , treatment of                            inflammatory changes, and repeat EGD to larger                            diameter  dilation if patient / family willing,                            pending his course, depending on how much symptoms                            bothering him. Will also plan on treating for                            esophageal candidiasis. Moderate Sedation:      No moderate sedation, case performed with MAC Recommendation:           - Patient has a contact number available for                            emergencies. The signs and symptoms of potential  delayed complications were discussed with the                            patient. Return to normal activities tomorrow.                            Written discharge instructions were provided to the                            patient.                           - Resume previous diet. I think he would do best                            with full liquids for now                           - Continue present medications.                           - Resume Eliquis  tomorrow                           - Start fluconazole  400mg  x 1 dose, then 200mg  /                            day for another 13 days                           - Increase protonix  to 40mg  twice daily                           - Start liquid carafate  10cc every 8 hours PRN                           - Await pathology results.                           - Considertion for repeat EGD with further dilation                            pending his course, based on symptoms and how                            aggressive family wishes to be with his care Procedure Code(s):        --- Professional ---                           [phone], Esophagogastroduodenoscopy, flexible,                            transoral; with transendoscopic balloon dilation of  esophagus (less than 30 mm diameter)                           43239, 59, Esophagogastroduodenoscopy, flexible,                            transoral; with biopsy, single or multiple Diagnosis  Code(s):        --- Professional ---                           K44.9, Diaphragmatic hernia without obstruction or                            gangrene                           K22.2, Esophageal obstruction                           Q39.9, Congenital malformation of esophagus,                            unspecified                           K22.9, Disease of esophagus, unspecified                           R13.10, Dysphagia, unspecified CPT copyright 2022 American Medical Association. All rights reserved. The codes documented in this report are preliminary and upon coder review may  be revised to meet current compliance requirements. Elspeth P. Stephaun Million, MD 12/08/2023 11:38:10 AM This report has been signed electronically. Number of Addenda: 0

## 2023-12-08 NOTE — Anesthesia Preprocedure Evaluation (Addendum)
 Anesthesia Evaluation  Patient identified by MRN, date of birth, ID band Patient awake    Reviewed: Allergy & Precautions, NPO status , Patient's Chart, lab work & pertinent test results  Airway Mallampati: III  TM Distance: >3 FB Neck ROM: Full    Dental no notable dental hx.    Pulmonary former smoker   Pulmonary exam normal        Cardiovascular hypertension, Pt. on home beta blockers + CAD, + Past MI and + Cardiac Stents  Normal cardiovascular exam     Neuro/Psych  PSYCHIATRIC DISORDERS Anxiety    Dementia  Neuromuscular disease    GI/Hepatic negative GI ROS, Neg liver ROS,,,  Endo/Other  negative endocrine ROS    Renal/GU negative Renal ROS     Musculoskeletal Wheelchair-bound   Abdominal   Peds  Hematology  (+) Blood dyscrasia (Eliquis )   Anesthesia Other Findings Dysphagia  Reproductive/Obstetrics                              Anesthesia Physical Anesthesia Plan  ASA: 4  Anesthesia Plan: MAC   Post-op Pain Management:    Induction:   PONV Risk Score and Plan: 1 and Propofol  infusion and Treatment may vary due to age or medical condition  Airway Management Planned:   Additional Equipment:   Intra-op Plan:   Post-operative Plan:   Informed Consent: I have reviewed the patients History and Physical, chart, labs and discussed the procedure including the risks, benefits and alternatives for the proposed anesthesia with the patient or authorized representative who has indicated his/her understanding and acceptance.     Dental advisory given  Plan Discussed with: CRNA  Anesthesia Plan Comments:          Anesthesia Quick Evaluation

## 2023-12-08 NOTE — Anesthesia Postprocedure Evaluation (Signed)
 Anesthesia Post Note  Patient: Gabriel [name]  Procedure(s) Performed: EGD (ESOPHAGOGASTRODUODENOSCOPY) BALLOON DILATION     Patient location during evaluation: Endoscopy Anesthesia Type: MAC Level of consciousness: awake Pain management: pain level controlled Vital Signs Assessment: post-procedure vital signs reviewed and stable Respiratory status: spontaneous breathing, nonlabored ventilation and respiratory function stable Cardiovascular status: blood pressure returned to baseline and stable Postop Assessment: no apparent nausea or vomiting Anesthetic complications: no   No notable events documented.  Last Vitals:  Vitals:   12/08/23 1150 12/08/23 1155  BP: (!) 145/71   Pulse: (!) 38 78  Resp: 16 (!) 34  Temp:    SpO2: 95% 97%    Last Pain:  Vitals:   12/08/23 1155  TempSrc:   PainSc: 0-No pain                 Benny Deutschman P Handy Mcloud

## 2023-12-08 NOTE — Discharge Instructions (Signed)

## 2023-12-08 NOTE — Interval H&P Note (Signed)
 History and Physical Interval Note: Patient here at [facility] For EGD this AM. Accompanied by his daughter. Severe dysphagia, weight loss, progressive over time. He has had remote prior EGDs with Dr. Teressa, the issue on those exams has been GEJ stricture. He has had that dilated before with benefit, last exam 08/2020. Barium swallow done recently - concern for recurrent GEJ stricture, also ? Of zenker's diverticulum with ? Of more proximal stricture as well, hard to say given it was a poor quality study. Discussed findings with the patient and his daughter. Symptoms so bothersome for the patient he wishes to proceed with EGD to see if we can dilate and relieve his symptoms. We have discussed risks of this at length to include aspiration, risks of anesthesia, injury to the esophagus / perforation, bleeding, etc. He has been off eliquis  for 2 days in preparation for this exam. They want to proceed with it. They understand if his symptoms are due to Zenker's that is not something I can manage today, if due to stricture hopefully I can dilate to provide relief / improvement. All questions answered they wish to proceed.   12/08/2023 10:43 AM  Gabriel [name]  has presented today for surgery, with the diagnosis of Dysphagia.  The various methods of treatment have been discussed with the patient and family. After consideration of risks, benefits and other options for treatment, the patient has consented to  Procedure(s): EGD (ESOPHAGOGASTRODUODENOSCOPY) (N/A) BALLOON DILATION (N/A) as a surgical intervention.  The patient's history has been reviewed, patient examined, no change in status, stable for surgery.  I have reviewed the patient's chart and labs.  Questions were answered to the patient's satisfaction.     Elspeth P Cornellius Kropp

## 2023-12-08 NOTE — Transfer of Care (Signed)
 Immediate Anesthesia Transfer of Care Note  Patient: Gabriel [name]  Procedure(s) Performed: EGD (ESOPHAGOGASTRODUODENOSCOPY) BALLOON DILATION  Patient Location: PACU  Anesthesia Type:MAC  Level of Consciousness: sedated  Airway & Oxygen Therapy: Patient Spontanous Breathing and Patient connected to face mask oxygen  Post-op Assessment: Report given to RN and Post -op Vital signs reviewed and stable  Post vital signs: Reviewed and stable  Last Vitals:  Vitals Value Taken Time  BP    Temp    Pulse 77 12/08/23 11:18  Resp 17 12/08/23 11:18  SpO2 99 % 12/08/23 11:18    Last Pain:  Vitals:   12/08/23 1014  TempSrc: Temporal  PainSc: 0-No pain         Complications: No notable events documented.

## 2023-12-09 ENCOUNTER — Ambulatory Visit: Payer: Self-pay | Admitting: Gastroenterology

## 2023-12-09 DIAGNOSIS — B49 Unspecified mycosis: Secondary | ICD-10-CM | POA: Diagnosis not present

## 2023-12-09 DIAGNOSIS — R627 Adult failure to thrive: Secondary | ICD-10-CM | POA: Diagnosis not present

## 2023-12-09 DIAGNOSIS — E46 Unspecified protein-calorie malnutrition: Secondary | ICD-10-CM | POA: Diagnosis not present

## 2023-12-09 DIAGNOSIS — H04123 Dry eye syndrome of bilateral lacrimal glands: Secondary | ICD-10-CM | POA: Diagnosis not present

## 2023-12-09 DIAGNOSIS — L22 Diaper dermatitis: Secondary | ICD-10-CM | POA: Diagnosis not present

## 2023-12-09 DIAGNOSIS — R634 Abnormal weight loss: Secondary | ICD-10-CM | POA: Diagnosis not present

## 2023-12-09 LAB — SURGICAL PATHOLOGY

## 2023-12-12 ENCOUNTER — Encounter (HOSPITAL_COMMUNITY): Payer: Self-pay | Admitting: Gastroenterology

## 2023-12-12 DIAGNOSIS — H04123 Dry eye syndrome of bilateral lacrimal glands: Secondary | ICD-10-CM | POA: Diagnosis not present

## 2023-12-12 DIAGNOSIS — L22 Diaper dermatitis: Secondary | ICD-10-CM | POA: Diagnosis not present

## 2023-12-12 DIAGNOSIS — R634 Abnormal weight loss: Secondary | ICD-10-CM | POA: Diagnosis not present

## 2023-12-12 DIAGNOSIS — E46 Unspecified protein-calorie malnutrition: Secondary | ICD-10-CM | POA: Diagnosis not present

## 2023-12-12 DIAGNOSIS — R627 Adult failure to thrive: Secondary | ICD-10-CM | POA: Diagnosis not present

## 2023-12-12 DIAGNOSIS — B49 Unspecified mycosis: Secondary | ICD-10-CM | POA: Diagnosis not present

## 2023-12-15 DIAGNOSIS — L22 Diaper dermatitis: Secondary | ICD-10-CM | POA: Diagnosis not present

## 2023-12-15 DIAGNOSIS — E46 Unspecified protein-calorie malnutrition: Secondary | ICD-10-CM | POA: Diagnosis not present

## 2023-12-15 DIAGNOSIS — R634 Abnormal weight loss: Secondary | ICD-10-CM | POA: Diagnosis not present

## 2023-12-15 DIAGNOSIS — R627 Adult failure to thrive: Secondary | ICD-10-CM | POA: Diagnosis not present

## 2023-12-15 DIAGNOSIS — B49 Unspecified mycosis: Secondary | ICD-10-CM | POA: Diagnosis not present

## 2023-12-15 DIAGNOSIS — H04123 Dry eye syndrome of bilateral lacrimal glands: Secondary | ICD-10-CM | POA: Diagnosis not present

## 2023-12-19 DIAGNOSIS — H04123 Dry eye syndrome of bilateral lacrimal glands: Secondary | ICD-10-CM | POA: Diagnosis not present

## 2023-12-19 DIAGNOSIS — R627 Adult failure to thrive: Secondary | ICD-10-CM | POA: Diagnosis not present

## 2023-12-19 DIAGNOSIS — L22 Diaper dermatitis: Secondary | ICD-10-CM | POA: Diagnosis not present

## 2023-12-19 DIAGNOSIS — E46 Unspecified protein-calorie malnutrition: Secondary | ICD-10-CM | POA: Diagnosis not present

## 2023-12-19 DIAGNOSIS — B49 Unspecified mycosis: Secondary | ICD-10-CM | POA: Diagnosis not present

## 2023-12-19 DIAGNOSIS — R634 Abnormal weight loss: Secondary | ICD-10-CM | POA: Diagnosis not present

## 2023-12-21 DIAGNOSIS — R634 Abnormal weight loss: Secondary | ICD-10-CM | POA: Diagnosis not present

## 2023-12-21 DIAGNOSIS — H04123 Dry eye syndrome of bilateral lacrimal glands: Secondary | ICD-10-CM | POA: Diagnosis not present

## 2023-12-21 DIAGNOSIS — B49 Unspecified mycosis: Secondary | ICD-10-CM | POA: Diagnosis not present

## 2023-12-21 DIAGNOSIS — L22 Diaper dermatitis: Secondary | ICD-10-CM | POA: Diagnosis not present

## 2023-12-21 DIAGNOSIS — E46 Unspecified protein-calorie malnutrition: Secondary | ICD-10-CM | POA: Diagnosis not present

## 2023-12-21 DIAGNOSIS — R627 Adult failure to thrive: Secondary | ICD-10-CM | POA: Diagnosis not present

## 2023-12-22 ENCOUNTER — Telehealth: Payer: Self-pay | Admitting: Gastroenterology

## 2023-12-22 DIAGNOSIS — R634 Abnormal weight loss: Secondary | ICD-10-CM | POA: Diagnosis not present

## 2023-12-22 DIAGNOSIS — L22 Diaper dermatitis: Secondary | ICD-10-CM | POA: Diagnosis not present

## 2023-12-22 DIAGNOSIS — E46 Unspecified protein-calorie malnutrition: Secondary | ICD-10-CM | POA: Diagnosis not present

## 2023-12-22 DIAGNOSIS — H04123 Dry eye syndrome of bilateral lacrimal glands: Secondary | ICD-10-CM | POA: Diagnosis not present

## 2023-12-22 DIAGNOSIS — B49 Unspecified mycosis: Secondary | ICD-10-CM | POA: Diagnosis not present

## 2023-12-22 DIAGNOSIS — R627 Adult failure to thrive: Secondary | ICD-10-CM | POA: Diagnosis not present

## 2023-12-22 NOTE — Telephone Encounter (Signed)
 Dottie sorry to hear about this. Has he been taking his protonix  40mg  twice daily? Please make sure he is taking the right dose. I can offer him another EGD if he and his family want to do it, to dilate the stricture further, but I don't know when I next inpatient opening is. Can you clarify when I could next be able to dilate him? If I don't have anything soon, I may need to ask one of my partners if they have any openings. If it is progressively getting worse and he cannot tolerate anything PO then it he may need to be hospitalized to get it done, if his symptoms are that bad.

## 2023-12-22 NOTE — Telephone Encounter (Signed)
 Did he have any benefit from the initial dilation a few weeks ago? Candidiasis is not causing the phlegm to come up. He has dysmotility and a severe GEJ stricture.  I think he should stay on a full liquid diet for now and the question is whether or not he would be willing to do another endoscopy to dilate the stricture further. The issue is it would have to be done at the hospital, hard to get in there quickly to treat this. Can you clarify if the dilation helped at all? Difficult situation. Thanks

## 2023-12-22 NOTE — Telephone Encounter (Signed)
 Donny says that patient did not notice any benefit from the last dilation. Patient stated that he normally could eat at least a little bit after he gets stretched but the last time he didn't notice one bit of difference.  Donny is advised that dysmotility may play a big role in his symptoms but there is a possibility we would try to further dilate the esophagus, though no guarantee it could be done quickly as it would need to be completed at the hospital. She verbalizes understanding.  Donny is advised that Dr Leigh does not feel that the phlegm is in relation to candidiasis. She states that she is very concerned about this because of the large amounts he is getting out. She states it tires him out trying to get all of this out of his throat.

## 2023-12-22 NOTE — Telephone Encounter (Signed)
 Patients daughter calls with concerns that patient still has large amounts of phlegm coming up daily. She states she was told that the phlegm was probably coming from the fact that patient had esophageal candidiasis. However, he has been treated with fluconazole  and has noticed no improvement in any of his symptoms. She notes that patient still complains of something stuck in his throat at his adams apple though not related to oral intake. Patient is still unable to eat any solid foods and is on a liquid diet; daughter notes that even [employer] milkshake is too thick for patient to drink. She states she would like to know if anything further can be done to help her father with his symptoms.  Please advise.

## 2023-12-22 NOTE — Telephone Encounter (Signed)
 Inbound call from patient daughter requesting to speak to either Alan or the nurse to relay a message to Dr. Leigh in regards to her father still have difficulty swallowing and to see if maybe there is something that can be prescribed. Patient daughter is requesting a call back at [phone]

## 2023-12-23 NOTE — Telephone Encounter (Signed)
 Gabriel [name] has already mentioned that if we felt another endoscopy may be helpful, she would be willing to have him complete the procedure.

## 2023-12-23 NOTE — Telephone Encounter (Signed)
 He is taking pantoprazole  40 mg twice daily (provided at [facility]).   Your next hospital availability would not be until 02/07/24.  Other availabilities currently: [facility] 12/29/23 at 830 am Charlanne 01/05/24 at 830 am Jolynn Pack  Also of note: per records under [facility] Tab in [facility], patient has been admitted to hospice care as of 12/08/23 with primary dx: protein calorie malnutrition.

## 2023-12-23 NOTE — Telephone Encounter (Signed)
 Got it, okay thanks. Lupita or [place], any chance you would be willing to help out with this patient for EGD with dilation at the hospital - you have the next available openings? Thanks for any consideration. He had a pretty tight stricture on the last exam at the GEJ, I think that and dysmotility are causing his problem

## 2023-12-23 NOTE — Telephone Encounter (Signed)
 Looks like [facility] could potentially do the case at 245 pm on 9/16 too if he is agreeable and it would still be within the appropriate timeframe.SABRASABRA

## 2023-12-23 NOTE — Telephone Encounter (Signed)
 Okay thanks Lupita for [employer]. Vito or [place], if around next week and see this, and you have still have an opening in your hospital slot, do you have any ability to add this patient? Dottie assuming I am already overbooked in my block that is upcoming, otherwise I would add him

## 2023-12-23 NOTE — Telephone Encounter (Signed)
 Okay - not sure if you spoke with him or family, but if you could call his family to determine how aggressive they want us  to be with his care? If they really want another endoscopy I will discuss with Dr. Avram / Charlanne to see if they would be willing to help as I don't have an opening to help him sooner. Can you let me know what they want to do? Thanks much for your help with this

## 2023-12-26 DIAGNOSIS — R682 Dry mouth, unspecified: Secondary | ICD-10-CM | POA: Diagnosis not present

## 2023-12-26 DIAGNOSIS — E785 Hyperlipidemia, unspecified: Secondary | ICD-10-CM | POA: Diagnosis not present

## 2023-12-26 DIAGNOSIS — F39 Unspecified mood [affective] disorder: Secondary | ICD-10-CM | POA: Diagnosis not present

## 2023-12-26 DIAGNOSIS — R634 Abnormal weight loss: Secondary | ICD-10-CM | POA: Diagnosis not present

## 2023-12-26 DIAGNOSIS — E46 Unspecified protein-calorie malnutrition: Secondary | ICD-10-CM | POA: Diagnosis not present

## 2023-12-26 DIAGNOSIS — B49 Unspecified mycosis: Secondary | ICD-10-CM | POA: Diagnosis not present

## 2023-12-26 DIAGNOSIS — K2100 Gastro-esophageal reflux disease with esophagitis, without bleeding: Secondary | ICD-10-CM | POA: Diagnosis not present

## 2023-12-26 DIAGNOSIS — H04123 Dry eye syndrome of bilateral lacrimal glands: Secondary | ICD-10-CM | POA: Diagnosis not present

## 2023-12-26 DIAGNOSIS — R627 Adult failure to thrive: Secondary | ICD-10-CM | POA: Diagnosis not present

## 2023-12-26 DIAGNOSIS — L22 Diaper dermatitis: Secondary | ICD-10-CM | POA: Diagnosis not present

## 2023-12-26 DIAGNOSIS — I2510 Atherosclerotic heart disease of native coronary artery without angina pectoris: Secondary | ICD-10-CM | POA: Diagnosis not present

## 2023-12-26 DIAGNOSIS — Z86711 Personal history of pulmonary embolism: Secondary | ICD-10-CM | POA: Diagnosis not present

## 2023-12-27 ENCOUNTER — Other Ambulatory Visit: Payer: Self-pay | Admitting: *Deleted

## 2023-12-27 ENCOUNTER — Telehealth: Payer: Self-pay | Admitting: *Deleted

## 2023-12-27 DIAGNOSIS — K219 Gastro-esophageal reflux disease without esophagitis: Secondary | ICD-10-CM

## 2023-12-27 DIAGNOSIS — Z8719 Personal history of other diseases of the digestive system: Secondary | ICD-10-CM

## 2023-12-27 DIAGNOSIS — R1310 Dysphagia, unspecified: Secondary | ICD-10-CM

## 2023-12-27 NOTE — Telephone Encounter (Signed)
 Great. Thank you very much for helping coordinate

## 2023-12-27 NOTE — Telephone Encounter (Signed)
Thanks [employer]

## 2023-12-27 NOTE — Telephone Encounter (Signed)
 Request for surgical clearance:     Endoscopy Procedure  What type of surgery is being performed?     Endoscopy with dilation  When is this surgery scheduled?     01/10/24  What type of clearance is required ?   Pharmacy  Are there any medications that need to be held prior to surgery and how long? Eliqius 2 days  Practice name and name of physician performing surgery?      [facility]  What is your office phone and fax number?      Phone- [phone]  Fax- [phone]  Anesthesia type (None, local, MAC, general) ?       MAC  Please route your response to Naomie Sharps, RN  Thanks!

## 2023-12-27 NOTE — Telephone Encounter (Signed)
 I have spoken to [facility] (daughter/POA) who states that she would like to schedule endoscopy with Dr San at [facility] hospital on 01/10/24 at 245 pm. She is advised that patient will need to arrive at 1:15 pm for registration. Advised clear liquids only after midnight the night prior to exam and NPO 4 hours prior to exam. I have sent eliquis  clearance request to Dr Burnard, cardiology as well.  Donny states that she will be out of the country on 01/10/24 and traveling in [place]. She is patient's power of attorney. She states however, that she will be available by phone to complete a verbal consent at phone [phone]. She also states that patient's other daughter will bring him for his procedure (though he lives at [facility]).  I have contacted [facility] and spoke to Patty to make her aware of the situation and availability for phone consent to complete procedure.

## 2023-12-28 ENCOUNTER — Encounter: Payer: Self-pay | Admitting: Nurse Practitioner

## 2023-12-28 ENCOUNTER — Non-Acute Institutional Stay (SKILLED_NURSING_FACILITY): Payer: Self-pay | Admitting: Nurse Practitioner

## 2023-12-28 DIAGNOSIS — K219 Gastro-esophageal reflux disease without esophagitis: Secondary | ICD-10-CM | POA: Diagnosis not present

## 2023-12-28 DIAGNOSIS — I480 Paroxysmal atrial fibrillation: Secondary | ICD-10-CM

## 2023-12-28 DIAGNOSIS — R627 Adult failure to thrive: Secondary | ICD-10-CM | POA: Diagnosis not present

## 2023-12-28 DIAGNOSIS — I5043 Acute on chronic combined systolic (congestive) and diastolic (congestive) heart failure: Secondary | ICD-10-CM | POA: Diagnosis not present

## 2023-12-28 DIAGNOSIS — R1310 Dysphagia, unspecified: Secondary | ICD-10-CM | POA: Diagnosis not present

## 2023-12-28 DIAGNOSIS — E876 Hypokalemia: Secondary | ICD-10-CM | POA: Diagnosis not present

## 2023-12-28 DIAGNOSIS — Z66 Do not resuscitate: Secondary | ICD-10-CM | POA: Diagnosis not present

## 2023-12-28 DIAGNOSIS — R21 Rash and other nonspecific skin eruption: Secondary | ICD-10-CM | POA: Insufficient documentation

## 2023-12-28 DIAGNOSIS — F419 Anxiety disorder, unspecified: Secondary | ICD-10-CM

## 2023-12-28 DIAGNOSIS — E46 Unspecified protein-calorie malnutrition: Secondary | ICD-10-CM | POA: Diagnosis not present

## 2023-12-28 DIAGNOSIS — B49 Unspecified mycosis: Secondary | ICD-10-CM | POA: Diagnosis not present

## 2023-12-28 DIAGNOSIS — H04123 Dry eye syndrome of bilateral lacrimal glands: Secondary | ICD-10-CM | POA: Diagnosis not present

## 2023-12-28 DIAGNOSIS — L22 Diaper dermatitis: Secondary | ICD-10-CM | POA: Diagnosis not present

## 2023-12-28 DIAGNOSIS — R634 Abnormal weight loss: Secondary | ICD-10-CM | POA: Diagnosis not present

## 2023-12-28 NOTE — Progress Notes (Signed)
 Location:  [facility] Room Number: N065-A Place of Service:  SNF (31) Provider:  [facility] Landon Truax, N.P.  Patient Care Team: Sherlynn Madden, MD as PCP - General (Internal Medicine) Kate Lonni CROME, MD as PCP - Cardiology (Cardiology) Octavia Charleston, MD as Consulting Physician (Ophthalmology) Elner Arley LABOR, MD as Consulting Physician (Ophthalmology) Duke, Jon Garre, PA as Physician Assistant (Cardiology)  Extended Emergency Contact Information Primary Emergency Contact: Garrett,Cathy  [place] Home Phone: [phone] Mobile Phone: [phone] Relation: Daughter Secondary Emergency Contact: Shepard,Susan  [place] Home Phone: [phone] Relation: Daughter  Code Status:  DNR Goals of care: Advanced Directive information    12/08/2023   10:11 AM  Advanced Directives  Does Patient Have a Medical Advance Directive? Yes  Type of Estate agent of [place];Living will;Out of facility DNR (pink MOST or yellow form)  Copy of Healthcare Power of Attorney in Chart? Yes - validated most recent copy scanned in chart (See row information)     Chief Complaint  Patient presents with   Wound Check    Patient with open wound on buttock     HPI:  Pt is a 89 y.o. male seen today for an acute visit for open wound R buttock, superficial skin missing, no s/s of infection.                Chronic dermatitis buttocks, followed by dermatology, presently: Mometasone, Clotrimazole , Miconazole, Fluocinonide , Nystatin  Dysphagia, choked in the past, chopped meat with thin fluid presently. Underwent GI evaluation, had barium swallow study, EDG/dilation 12/08/23,  last dilatation 2022,               Anxiety, tolerated Mirtazapine, failed effexor, Lexapro, Sertraline . Occasional irritable.             The right arm pain, more at night, Tylenol  at night helped               03/24/23 right elbow fx(distal end of  right humerus), healed with deformity and limited ROM, has been to surgeon-non surgical candidate.             CAD ST elevation MI LAD 05/2021, underwent Cath, stenting, s/p percutaneous coronary angioplasty, started DAPT, Echo EF 40-45%,  65-70% 11/02/21. On Eliquis  only now.              PE 11/02/21, had CTA 05/12/22, 09/08/22 Cardiology dc Eliquis , then resumed 03/2023 and off ASA and Plavix  03/2023 in setting of Afib.  Repeated CT @ 6 mos,  negative PE             Grade 2 diastolic dysfunction, no apparent swelling BLE, SOB, off Furosemide , followed by Cardiology, BNP 231/7 06/23/23.             Afib, resumed Eliquis  and dc'd ASA/Plavix  03/2023 per cardiology, on  Metoprolol , EKG 08/08/23 Afib with slow HR 50s.              GERD, thing liquid, hx of dilatation,  takes Pantoprazole , prn Sucrafate Hgb 14.1 12/01/23             Hypokalemia, on Kcl K 3.5 12/08/23             Hyponatremia, Na 141 12/08/23             HLD off Atorvastatin  recommended by Cardiology 06/28/23, LDL 30 11/12/22             HTN, on Metoprolol   Peripheral neuropathy, GDR Gabapentin , resolving excessive sleepiness.              Cognitive impairment: MMSE 19/30 05/20/23, SNF [facility] for care needs.      Past Medical History:  Diagnosis Date   Arthritis    Diverticulosis of colon    External thrombosed hemorrhoids 06/11/2009   Qualifier: Diagnosis of  By: Johnny MD, Garnette LABOR    GERD (gastroesophageal reflux disease)    Hypertension    Past Surgical History:  Procedure Laterality Date   BALLOON DILATION N/A 12/08/2023   Procedure: BALLOON DILATION;  Surgeon: Leigh Elspeth SQUIBB, MD;  Location: [facility];  Service: Gastroenterology;  Laterality: N/A;   cataract extaction, right  6/09   per Dr. Lorrene   CATARACT EXTRACTION Left    COLONOSCOPY  09/23/04   per Dr. Nolon, clear, no repeats needed    CORONARY STENT INTERVENTION N/A 06/06/2021   Procedure: CORONARY STENT INTERVENTION;  Surgeon: Mady Bruckner, MD;  Location:  [facility];  Service: Cardiovascular;  Laterality: N/A;   CORONARY/GRAFT ACUTE MI REVASCULARIZATION N/A 06/06/2021   Procedure: Coronary/Graft Acute MI Revascularization;  Surgeon: Mady Bruckner, MD;  Location: [facility];  Service: Cardiovascular;  Laterality: N/A;   EGD and esophageal diatation,  05-10-11   per Dr. Teressa     ESOPHAGOGASTRODUODENOSCOPY N/A 12/08/2023   Procedure: EGD (ESOPHAGOGASTRODUODENOSCOPY);  Surgeon: Leigh Elspeth SQUIBB, MD;  Location: [facility];  Service: Gastroenterology;  Laterality: N/A;   HERNIA REPAIR     LEFT HEART CATH AND CORONARY ANGIOGRAPHY N/A 06/06/2021   Procedure: LEFT HEART CATH AND CORONARY ANGIOGRAPHY;  Surgeon: Mady Bruckner, MD;  Location: [facility];  Service: Cardiovascular;  Laterality: N/A;   rt inguinal hernia     x2    Allergies  Allergen Reactions   Effexor [Venlafaxine]     Pt became lethargic, drowsy and not his usual self with effexor.   Contrast Media [Iodinated Contrast Media] Rash    Outpatient Encounter Medications as of 12/28/2023  Medication Sig   acetaminophen  (TYLENOL ) 325 MG tablet Take 650 mg by mouth every 8 (eight) hours as needed.   acetaminophen  (TYLENOL ) 500 MG tablet Take 1,000 mg by mouth 3 (three) times daily.   antiseptic oral rinse (BIOTENE) LIQD 15 mLs by Mouth Rinse route daily.   apixaban  (ELIQUIS ) 5 MG TABS tablet Take 1 tablet (5 mg total) by mouth 2 (two) times daily.   clotrimazole -betamethasone  (LOTRISONE ) cream Apply 1 Application topically 2 (two) times daily as needed. Fax#[phone]   docusate sodium (COLACE) 100 MG capsule Take 100 mg by mouth 2 (two) times daily.   fluocinonide  cream (LIDEX ) 0.05 % Apply 1 Application topically 2 (two) times daily.   gabapentin  (NEURONTIN ) 300 MG capsule Take 300 mg by mouth daily.   guaiFENesin  (MUCINEX ) 600 MG 12 hr tablet Take 1,200 mg by mouth 2 (two) times daily.   lactose free nutrition (BOOST) LIQD Take 237 mLs by mouth 4  (four) times daily.   metoprolol  succinate (TOPROL  XL) 25 MG 24 hr tablet Take 0.5 tablets (12.5 mg total) by mouth in the morning and at bedtime.   miconazole (MICOTIN) 2 % powder Apply 1 Application topically every 8 (eight) hours as needed for itching.   mirtazapine (REMERON) 7.5 MG tablet Take 7.5 mg by mouth at bedtime.   mometasone (ELOCON) 0.1 % cream Apply 1 Application topically daily as needed.   nitroGLYCERIN  (NITROSTAT ) 0.4 MG SL tablet Place 1 tablet (0.4 mg  total) under the tongue every 5 (five) minutes as needed for chest pain.   nystatin  (MYCOSTATIN /NYSTOP ) powder Apply 1 Application topically 3 (three) times daily.   pantoprazole  (PROTONIX ) 40 MG tablet Take 40 mg by mouth 2 (two) times daily.   polyvinyl alcohol (LIQUIFILM TEARS) 1.4 % ophthalmic solution Place 2 drops into both eyes 2 (two) times daily.   potassium chloride  SA (KLOR-CON  M) 20 MEQ tablet Take 1 tablet (20 mEq total) by mouth daily.   pramoxine (CERAVE ITCH RELIEF) 1 % LOTN Apply 1 Application topically 2 (two) times daily.   sucralfate  (CARAFATE ) 1 GM/10ML suspension Take 10 mLs (1 g total) by mouth every 8 (eight) hours as needed.   [DISCONTINUED] fluticasone  (FLONASE ) 50 MCG/ACT nasal spray Place 2 sprays into both nostrils daily. (Patient not taking: Reported on 12/28/2023)   [DISCONTINUED] furosemide  (LASIX ) 40 MG tablet Take 1.5 pills daily for 3 days then resume 40 mg daily. (Patient not taking: Reported on 12/28/2023)   [DISCONTINUED] furosemide  (LASIX ) 40 MG tablet Take 40 mg by mouth daily. (Patient not taking: Reported on 12/28/2023)   [DISCONTINUED] gabapentin  (NEURONTIN ) 300 MG capsule TAKE 1 CAPSULE BY MOUTH THREE TIMES A DAY (Patient not taking: Reported on 12/28/2023)   [DISCONTINUED] hydrOXYzine  (VISTARIL ) 25 MG capsule Take 1 capsule (25 mg total) by mouth daily as needed. Give for agitation (Patient not taking: Reported on 12/28/2023)   [DISCONTINUED] LORazepam (ATIVAN) 0.5 MG tablet Take 0.5 mg by mouth as  directed. Give 1 tablet by mouth one time only for agitation for 1 day may repeat in 1-2 hours if no improvement in behavior. (Patient not taking: Reported on 12/28/2023)   [DISCONTINUED] Menthol-Zinc Oxide (CALMOSEPTINE) 0.44-20.6 % OINT Apply 1 Application topically 2 (two) times daily. To buttocks (Patient not taking: Reported on 12/28/2023)   [DISCONTINUED] sertraline  (ZOLOFT ) 25 MG tablet Take 1 tablet (25 mg total) by mouth daily. (Patient not taking: Reported on 12/28/2023)   No facility-administered encounter medications on file as of 12/28/2023.    Review of Systems  Constitutional:  Negative for appetite change, fatigue and fever.  HENT:  Positive for hearing loss and trouble swallowing. Negative for congestion.   Eyes:  Negative for visual disturbance.  Respiratory:  Positive for cough. Negative for chest tightness, shortness of breath and wheezing.   Cardiovascular:  Negative for leg swelling.  Gastrointestinal:  Negative for abdominal pain and constipation.  Genitourinary:  Negative for dysuria, frequency and urgency.       Incontinent of urine  Musculoskeletal:  Positive for arthralgias and gait problem.       R arm pain.   Skin:  Positive for rash and wound.  Neurological:  Negative for speech difficulty, weakness and headaches.  Psychiatric/Behavioral:  Negative for confusion, self-injury and sleep disturbance. The patient is not nervous/anxious.     Immunization History  Administered Date(s) Administered   Fluad Quad(high Dose 65+) 02/15/2022, 02/08/2023   INFLUENZA, HIGH DOSE SEASONAL PF 12/30/2016, 12/27/2017, 01/08/2019   Influenza Split 01/20/2012, 01/07/2013   Influenza Whole 02/09/2006, 01/11/2008   Influenza,inj,quad, With Preservative 12/29/2018   Influenza-Unspecified 01/17/2014, 01/14/2016, 12/30/2016   Moderna Covid-19 Fall Seasonal Vaccine 39yrs & older 03/22/2022, 02/09/2023   PFIZER Comirnaty(Gray Top)Covid-19 Tri-Sucrose Vaccine 05/28/2019, 03/04/2020,  09/23/2020   Pfizer Covid-19 Vaccine Bivalent Booster 71yrs & up 02/25/2022   Pneumococcal Conjugate-13 09/23/2015   Pneumococcal Polysaccharide-23 03/13/2008   Td 12/20/2007   Tdap 07/06/2021   Unspecified SARS-COV-2 Vaccination 04/30/2019   Zoster Recombinant(Shingrix) 03/11/2020, 06/20/2020   Pertinent  Health Maintenance Due  Topic Date Due   INFLUENZA VACCINE  01/25/2024 (Originally 11/25/2023)   FOOT EXAM  Discontinued   HEMOGLOBIN A1C  Discontinued   OPHTHALMOLOGY EXAM  Discontinued      05/04/2022    2:44 PM 06/01/2022   11:04 AM 06/15/2022    9:14 AM 06/23/2022    3:12 PM 10/19/2022   11:19 AM  Fall Risk  Falls in the past year? 1 1 1 1  Exclusion - non ambulatory  Was there an injury with Fall? 1 1 1 1    Was there an injury with Fall? - Comments    Fx Rt Ekbow. Followed by Orthopedic   Fall Risk Category Calculator 3 3 3 2    Fall Risk Category (Retired) High       (RETIRED) Patient Fall Risk Level High fall risk       Patient at Risk for Falls Due to History of fall(s);Impaired balance/gait;Impaired mobility;Impaired vision History of fall(s);Impaired balance/gait;Impaired mobility;Impaired vision History of fall(s);Impaired balance/gait;Impaired mobility Orthopedic patient   Fall risk Follow up Falls evaluation completed  Falls evaluation completed Falls evaluation completed Falls prevention discussed Falls evaluation completed     Data saved with a previous flowsheet row definition   Functional Status Survey:    Vitals:   12/28/23 1139  BP: 114/75  Pulse: 76  Resp: 17  Temp: (!) 96.9 F (36.1 C)  SpO2: 96%  Weight: 131 lb 12.8 oz (59.8 kg)  Height: 5' 7 (1.702 m)   Body mass index is 20.64 kg/m. Physical Exam Vitals and nursing note reviewed.  Constitutional:      Appearance: Normal appearance.  HENT:     Head: Normocephalic.     Comments:      Nose: Nose normal. No congestion.     Mouth/Throat:     Mouth: Mucous membranes are moist.  Eyes:      Extraocular Movements: Extraocular movements intact.     Conjunctiva/sclera: Conjunctivae normal.     Pupils: Pupils are equal, round, and reactive to light.  Cardiovascular:     Rate and Rhythm: Normal rate. Rhythm irregular.     Heart sounds: No murmur heard. Pulmonary:     Effort: Pulmonary effort is normal.     Breath sounds: No wheezing, rhonchi or rales.  Abdominal:     General: Bowel sounds are normal.     Palpations: Abdomen is soft.     Tenderness: There is no abdominal tenderness.  Musculoskeletal:        General: Deformity present. No tenderness.     Cervical back: Normal range of motion and neck supple.     Right lower leg: No edema.     Left lower leg: No edema.     Comments: Right elbow deformity and decreased ROM   Skin:    General: Skin is warm and dry.     Findings: Erythema and rash present.     Comments: Chronic perineal/buttocks skin irritation from moist and heat: dark reddened buttocks-improved R buttock, superficial skin missing about his palm sized area medical aspect adjacent to intergluteal cleft    Neurological:     General: No focal deficit present.     Mental Status: He is alert and oriented to person, place, and time. Mental status is at baseline.     Gait: Gait abnormal.     Comments: W/c for mobility.   Psychiatric:        Mood and Affect: Mood normal.  Behavior: Behavior normal.        Thought Content: Thought content normal.     Labs reviewed: Recent Labs    06/23/23 1142 07/07/23 0000 11/29/23 2000 12/01/23 0000 12/08/23 0000  NA 140   < > 138 137 141  K 3.6   < > 4.0 3.5 3.5  CL 102   < > 103 103 108  CO2 20   < > 24 25* 23*  GLUCOSE 107*  --  124*  --   --   BUN 15   < > 21 23* 19  CREATININE 0.56*   < > 0.76 0.8 0.6  CALCIUM  8.6   < > 8.7* 8.6* 8.3*   < > = values in this interval not displayed.   Recent Labs    06/23/23 1142 11/29/23 2000 12/01/23 0000  AST 37 26 20  ALT 32 21 17  ALKPHOS 171* 122 124   BILITOT 1.0 1.1  --   PROT 6.0 7.0  --   ALBUMIN 3.1* 2.5* 2.8*   Recent Labs    06/23/23 1142 10/19/23 0000 11/29/23 2000 12/01/23 0000  WBC 11.0* 7.6 10.8* 11.7  NEUTROABS  --  5,290.00 8.2* 8,822.00  HGB 13.2 13.3* 14.3 14.1  HCT 40.7 41 44.1 41  MCV 89  --  91.3  --   PLT 309 286 285 291   Lab Results  Component Value Date   TSH 2.039 11/02/2021   Lab Results  Component Value Date   HGBA1C 5.6 11/12/2022   Lab Results  Component Value Date   CHOL 79 11/12/2022   HDL 33 (A) 11/12/2022   LDLCALC 30 11/12/2022   LDLDIRECT 72.0 09/27/2017   TRIG 80 11/12/2022   CHOLHDL 2 10/14/2021    Significant Diagnostic Results in last 30 days:  DG ESOPHAGUS W SINGLE CM (SOL OR THIN BA) Result Date: 12/05/2023 CLINICAL DATA:  Worsening dysphagia. History of esophageal stricture. EXAM: ESOPHAGUS/BARIUM SWALLOW/TABLET STUDY TECHNIQUE: Single contrast examination was performed using thin liquid barium. This exam was performed by Sari Lamp, PA-C, and was supervised and interpreted by Dr. Rockey Kilts. FLUOROSCOPY: Radiation Exposure Index (as provided by the fluoroscopic device): 13.7 mGy Kerma COMPARISON:  None Available. FINDINGS: Extremely limited exam secondary to patient age, immobility. Patient halted the exam prior to its completion. Moderate esophageal dysmotility, with incomplete primary peristaltic wave, contrast stasis throughout the esophagus. A small to moderate hiatal hernia. An area of persistent underdistention within the gastroesophageal junction, best distended on 167 of series 1. There is likely a Zenker's diverticulum. Suboptimally evaluated secondary to inability to perform lateral view. Apparent delayed passage of contrast just caudal to this level, suggesting a moderate stricture, including on 77/5. IMPRESSION: 1. Markedly degraded exam as detailed above. 2. Moderate esophageal dysmotility, likely presbyesophagus. 3. 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 4. Small to moderate hiatal hernia with possible mild stricture at the gastroesophageal junction. Electronically Signed   By: Rockey Kilts M.D.   On: 12/05/2023 15:59    Assessment/Plan Excoriated rash open wound R buttock, superficial skin missing, no s/s of infection.  chronic dermatitis buttocks, followed by dermatology, presently: Mometasone, Clotrimazole , Miconazole, Fluocinonide , Nystatin  Heat, moist, and pressure are contributory, cover the open area with Xeroform dressing, avoid tapes  Dysphagia choked in the past, chopped meat with thin fluid presently. Underwent GI evaluation, had barium swallow study, EDG/dilation 12/08/23,  last dilatation 2022  Anxiety  tolerated Mirtazapine, failed effexor, Lexapro, Sertraline . Occasional irritable.  Congestive heart  failure (CHF) (HCC) Grade 2 diastolic dysfunction, no apparent swelling BLE, SOB, off Furosemide , followed by Cardiology, BNP 231/7 06/23/23.  Paroxysmal atrial fibrillation (HCC)  resumed Eliquis  and dc'd ASA/Plavix  03/2023 per cardiology, on  Metoprolol , EKG 08/08/23 Afib with slow HR 50s.   GERD  thing liquid, hx of dilatation,  takes Pantoprazole , prn Sucrafate Hgb 14.1 12/01/23  Hypokalemia  on Kcl K 3.5 12/08/23     Family/ staff Communication: Plan of care reviewed with the patient and charge nurse  Labs/tests ordered: None

## 2023-12-28 NOTE — Telephone Encounter (Signed)
 I have spoken to Deja, patient's nurse at [facility] (phone [phone] ext [phone]) to advise of upcoming endoscopy on 01/10/24 with generalized prep instructions as well as order to hold Eliquis  2 days prior to his procedure. Deja verbalizes understanding and has been made aware that prep instructions/eliquis  hold note has been faxed to her at [phone] for her review.

## 2023-12-28 NOTE — Assessment & Plan Note (Signed)
 Grade 2 diastolic dysfunction, no apparent swelling BLE, SOB, off Furosemide , followed by Cardiology, BNP 231/7 06/23/23.

## 2023-12-28 NOTE — Assessment & Plan Note (Signed)
 choked in the past, chopped meat with thin fluid presently. Underwent GI evaluation, had barium swallow study, EDG/dilation 12/08/23,  last dilatation 2022

## 2023-12-28 NOTE — Assessment & Plan Note (Signed)
 resumed Eliquis  and dc'd ASA/Plavix  03/2023 per cardiology, on  Metoprolol , EKG 08/08/23 Afib with slow HR 50s.

## 2023-12-28 NOTE — Telephone Encounter (Signed)
 Left message for Donny, patient's daughter/POA to call back.

## 2023-12-28 NOTE — Telephone Encounter (Signed)
 Patient with diagnosis of A Fib on Eliquis  for anticoagulation.    Procedure: Endoscopy with dilation  Date of procedure: 01/10/24   CHA2DS2-VASc Score = 7  This indicates a 11.2% annual risk of stroke. The patient's score is based upon: CHF History: 1 HTN History: 1 Diabetes History: 0 Stroke History: 2 Vascular Disease History: 1 Age Score: 2 Gender Score: 0     CrCl 44 ml/min Platelet count 285K   Per office protocol, patient can hold Eliquis  for 2 days prior to procedure.    **This guidance is not considered finalized until pre-operative APP has relayed final recommendations.**

## 2023-12-28 NOTE — Assessment & Plan Note (Signed)
 open wound R buttock, superficial skin missing, no s/s of infection.  chronic dermatitis buttocks, followed by dermatology, presently: Mometasone, Clotrimazole , Miconazole, Fluocinonide , Nystatin  Heat, moist, and pressure are contributory, cover the open area with Xeroform dressing, avoid tapes

## 2023-12-28 NOTE — Assessment & Plan Note (Signed)
 thing liquid, hx of dilatation,  takes Pantoprazole , prn Sucrafate Hgb 14.1 12/01/23

## 2023-12-28 NOTE — Telephone Encounter (Signed)
   Patient Name: Gabriel [name]  DOB: Feb 04, 1925 MRN: [id number]  Primary Cardiologist: Lonni LITTIE Nanas, MD  Chart reviewed as part of pre-operative protocol coverage. Pre-op clearance already addressed by colleagues in earlier phone notes. To summarize recommendations:  Per PharmD Patient with diagnosis of A Fib on Eliquis  for anticoagulation.     Procedure: Endoscopy with dilation  Date of procedure: 01/10/24     CHA2DS2-VASc Score = 7  This indicates a 11.2% annual risk of stroke. The patient's score is based upon: CHF History: 1 HTN History: 1 Diabetes History: 0 Stroke History: 2 Vascular Disease History: 1 Age Score: 2 Gender Score: 0       CrCl 44 ml/min Platelet count 285K     Per office protocol, patient can hold Eliquis  for 2 days prior to procedure.      Will route this bundled recommendation to requesting provider via Epic fax function and remove from pre-op pool. Please call with questions.  Rollo FABIENE Louder, PA-C 12/28/2023, 9:49 AM

## 2023-12-28 NOTE — Assessment & Plan Note (Signed)
 on Kcl K 3.5 12/08/23

## 2023-12-28 NOTE — Telephone Encounter (Signed)
 Spoke to Weidman to advise of clearance from cardiology for patient to hold Eliquis  2 days prior to his endoscopy.  I have also spoken to Deja, patient's nurse at [facility] (phone [phone] ext [phone]) to advise that patient may hold eliquis  2 days prior to upcoming endoscopic procedure 01/10/24. She verbalizes understanding. Written clearance has also been faxed to Deja at [phone].

## 2023-12-28 NOTE — Assessment & Plan Note (Signed)
 tolerated Mirtazapine, failed effexor, Lexapro, Sertraline . Occasional irritable.

## 2023-12-30 DIAGNOSIS — H04123 Dry eye syndrome of bilateral lacrimal glands: Secondary | ICD-10-CM | POA: Diagnosis not present

## 2023-12-30 DIAGNOSIS — R634 Abnormal weight loss: Secondary | ICD-10-CM | POA: Diagnosis not present

## 2023-12-30 DIAGNOSIS — B49 Unspecified mycosis: Secondary | ICD-10-CM | POA: Diagnosis not present

## 2023-12-30 DIAGNOSIS — R627 Adult failure to thrive: Secondary | ICD-10-CM | POA: Diagnosis not present

## 2023-12-30 DIAGNOSIS — E46 Unspecified protein-calorie malnutrition: Secondary | ICD-10-CM | POA: Diagnosis not present

## 2023-12-30 DIAGNOSIS — L22 Diaper dermatitis: Secondary | ICD-10-CM | POA: Diagnosis not present

## 2023-12-31 DIAGNOSIS — E46 Unspecified protein-calorie malnutrition: Secondary | ICD-10-CM | POA: Diagnosis not present

## 2023-12-31 DIAGNOSIS — R634 Abnormal weight loss: Secondary | ICD-10-CM | POA: Diagnosis not present

## 2023-12-31 DIAGNOSIS — L22 Diaper dermatitis: Secondary | ICD-10-CM | POA: Diagnosis not present

## 2023-12-31 DIAGNOSIS — H04123 Dry eye syndrome of bilateral lacrimal glands: Secondary | ICD-10-CM | POA: Diagnosis not present

## 2023-12-31 DIAGNOSIS — R627 Adult failure to thrive: Secondary | ICD-10-CM | POA: Diagnosis not present

## 2023-12-31 DIAGNOSIS — B49 Unspecified mycosis: Secondary | ICD-10-CM | POA: Diagnosis not present

## 2024-01-01 DIAGNOSIS — R634 Abnormal weight loss: Secondary | ICD-10-CM | POA: Diagnosis not present

## 2024-01-01 DIAGNOSIS — H04123 Dry eye syndrome of bilateral lacrimal glands: Secondary | ICD-10-CM | POA: Diagnosis not present

## 2024-01-01 DIAGNOSIS — R627 Adult failure to thrive: Secondary | ICD-10-CM | POA: Diagnosis not present

## 2024-01-01 DIAGNOSIS — B49 Unspecified mycosis: Secondary | ICD-10-CM | POA: Diagnosis not present

## 2024-01-01 DIAGNOSIS — E46 Unspecified protein-calorie malnutrition: Secondary | ICD-10-CM | POA: Diagnosis not present

## 2024-01-01 DIAGNOSIS — L22 Diaper dermatitis: Secondary | ICD-10-CM | POA: Diagnosis not present

## 2024-01-02 DIAGNOSIS — R627 Adult failure to thrive: Secondary | ICD-10-CM | POA: Diagnosis not present

## 2024-01-02 DIAGNOSIS — L22 Diaper dermatitis: Secondary | ICD-10-CM | POA: Diagnosis not present

## 2024-01-02 DIAGNOSIS — H04123 Dry eye syndrome of bilateral lacrimal glands: Secondary | ICD-10-CM | POA: Diagnosis not present

## 2024-01-02 DIAGNOSIS — E46 Unspecified protein-calorie malnutrition: Secondary | ICD-10-CM | POA: Diagnosis not present

## 2024-01-02 DIAGNOSIS — R634 Abnormal weight loss: Secondary | ICD-10-CM | POA: Diagnosis not present

## 2024-01-02 DIAGNOSIS — B49 Unspecified mycosis: Secondary | ICD-10-CM | POA: Diagnosis not present

## 2024-01-02 NOTE — Telephone Encounter (Signed)
 Inbound call from Gabriel [name] requesting to cancel upcoming procedure due to patient health declining. Donny states she feels patient is not strong enough to have the procedure. No further questions or concerns.

## 2024-01-02 NOTE — Telephone Encounter (Signed)
 9/16 hospital procedure cancelled per daughter's request.

## 2024-01-02 NOTE — Telephone Encounter (Signed)
 Got it, sorry to hear this. Thanks for letting me know. Vito appreciate you offering to help do the exam for him.  Dottie - just an FYI, I know you worked hard on scheduling this for him. Thanks

## 2024-01-03 DIAGNOSIS — H04123 Dry eye syndrome of bilateral lacrimal glands: Secondary | ICD-10-CM | POA: Diagnosis not present

## 2024-01-03 DIAGNOSIS — R634 Abnormal weight loss: Secondary | ICD-10-CM | POA: Diagnosis not present

## 2024-01-03 DIAGNOSIS — E46 Unspecified protein-calorie malnutrition: Secondary | ICD-10-CM | POA: Diagnosis not present

## 2024-01-03 DIAGNOSIS — R627 Adult failure to thrive: Secondary | ICD-10-CM | POA: Diagnosis not present

## 2024-01-03 DIAGNOSIS — L22 Diaper dermatitis: Secondary | ICD-10-CM | POA: Diagnosis not present

## 2024-01-03 DIAGNOSIS — B49 Unspecified mycosis: Secondary | ICD-10-CM | POA: Diagnosis not present

## 2024-01-04 ENCOUNTER — Other Ambulatory Visit: Payer: Self-pay | Admitting: Adult Health

## 2024-01-04 DIAGNOSIS — R627 Adult failure to thrive: Secondary | ICD-10-CM | POA: Diagnosis not present

## 2024-01-04 DIAGNOSIS — Z515 Encounter for palliative care: Secondary | ICD-10-CM

## 2024-01-04 DIAGNOSIS — E46 Unspecified protein-calorie malnutrition: Secondary | ICD-10-CM | POA: Diagnosis not present

## 2024-01-04 DIAGNOSIS — R634 Abnormal weight loss: Secondary | ICD-10-CM | POA: Diagnosis not present

## 2024-01-04 DIAGNOSIS — B49 Unspecified mycosis: Secondary | ICD-10-CM | POA: Diagnosis not present

## 2024-01-04 DIAGNOSIS — H04123 Dry eye syndrome of bilateral lacrimal glands: Secondary | ICD-10-CM | POA: Diagnosis not present

## 2024-01-04 DIAGNOSIS — L22 Diaper dermatitis: Secondary | ICD-10-CM | POA: Diagnosis not present

## 2024-01-04 MED ORDER — LORAZEPAM 0.5 MG PO TABS: 0.5000 mg | ORAL_TABLET | ORAL | 0 refills | Status: DC | PRN

## 2024-01-04 MED ORDER — MORPHINE SULFATE (CONCENTRATE) 20 MG/ML PO SOLN: 5.0000 mg | ORAL | 0 refills | Status: DC | PRN

## 2024-01-05 ENCOUNTER — Telehealth: Payer: Self-pay

## 2024-01-05 NOTE — Telephone Encounter (Signed)
 Procedure:EGD Procedure date: 01/10/24 Procedure location: [facility] Arrival Time: 12:30 Spoke with the patient Y/N: N Any prep concerns? N  Has the patient obtained the prep from the pharmacy ? N Do you have a care partner and transportation: N Any additional concerns? N  I called patient and spoke to his daugter that stated his procedure was cancelled due to his health concerns

## 2024-01-06 NOTE — Telephone Encounter (Signed)
 Yes, procedure was already cancelled.

## 2024-01-10 ENCOUNTER — Encounter (HOSPITAL_COMMUNITY): Payer: Self-pay

## 2024-01-10 ENCOUNTER — Ambulatory Visit (HOSPITAL_COMMUNITY): Admit: 2024-01-10 | Admitting: Gastroenterology

## 2024-01-10 SURGERY — EGD, WITH DILATION USING SAVARY-GILLIARD DILATOR OVER GUIDEWIRE
Anesthesia: Monitor Anesthesia Care

## 2024-01-25 DEATH — deceased
